# Patient Record
Sex: Female | Born: 1935
Health system: Southern US, Community
[De-identification: ages and names within clinical notes are randomized; demographics above are authoritative.]

## PROBLEM LIST (undated history)

## (undated) DIAGNOSIS — M1990 Unspecified osteoarthritis, unspecified site: Secondary | ICD-10-CM

## (undated) DIAGNOSIS — K219 Gastro-esophageal reflux disease without esophagitis: Secondary | ICD-10-CM

## (undated) DIAGNOSIS — W19XXXA Unspecified fall, initial encounter: Secondary | ICD-10-CM

## (undated) DIAGNOSIS — I129 Hypertensive chronic kidney disease with stage 1 through stage 4 chronic kidney disease, or unspecified chronic kidney disease: Secondary | ICD-10-CM

## (undated) DIAGNOSIS — F32A Depression, unspecified: Secondary | ICD-10-CM

## (undated) DIAGNOSIS — F329 Major depressive disorder, single episode, unspecified: Secondary | ICD-10-CM

## (undated) DIAGNOSIS — M810 Age-related osteoporosis without current pathological fracture: Secondary | ICD-10-CM

## (undated) DIAGNOSIS — R413 Other amnesia: Secondary | ICD-10-CM

## (undated) DIAGNOSIS — Z9109 Other allergy status, other than to drugs and biological substances: Secondary | ICD-10-CM

## (undated) DIAGNOSIS — E538 Deficiency of other specified B group vitamins: Secondary | ICD-10-CM

## (undated) DIAGNOSIS — G309 Alzheimer's disease, unspecified: Secondary | ICD-10-CM

## (undated) DIAGNOSIS — J302 Other seasonal allergic rhinitis: Secondary | ICD-10-CM

## (undated) DIAGNOSIS — F0280 Dementia in other diseases classified elsewhere without behavioral disturbance: Secondary | ICD-10-CM

## (undated) DIAGNOSIS — I10 Essential (primary) hypertension: Secondary | ICD-10-CM

## (undated) DIAGNOSIS — E785 Hyperlipidemia, unspecified: Secondary | ICD-10-CM

## (undated) HISTORY — DX: Gastro-esophageal reflux disease without esophagitis: K21.9

## (undated) HISTORY — DX: Depression, unspecified: F32.A

## (undated) HISTORY — DX: Unspecified osteoarthritis, unspecified site: M19.90

## (undated) HISTORY — DX: Unspecified fall, initial encounter: W19.XXXA

## (undated) HISTORY — DX: Hyperlipidemia, unspecified: E78.5

## (undated) HISTORY — DX: Other amnesia: R41.3

## (undated) HISTORY — PX: NASAL SINUS SURGERY: SHX719

## (undated) HISTORY — DX: Hypertensive chronic kidney disease with stage 1 through stage 4 chronic kidney disease, or unspecified chronic kidney disease: I12.9

## (undated) HISTORY — DX: Deficiency of other specified B group vitamins: E53.8

## (undated) HISTORY — DX: Major depressive disorder, single episode, unspecified: F32.9

## (undated) HISTORY — PX: VAGINAL HYSTERECTOMY: SUR661

## (undated) HISTORY — DX: Dementia in other diseases classified elsewhere, unspecified severity, without behavioral disturbance, psychotic disturbance, mood disturbance, and anxiety: F02.80

## (undated) HISTORY — DX: Age-related osteoporosis without current pathological fracture: M81.0

## (undated) HISTORY — DX: Other allergy status, other than to drugs and biological substances: Z91.09

## (undated) HISTORY — DX: Alzheimer's disease, unspecified: G30.9

## (undated) HISTORY — DX: Other seasonal allergic rhinitis: J30.2

---

## 2003-08-28 ENCOUNTER — Encounter: Payer: Self-pay | Admitting: Internal Medicine

## 2006-02-10 ENCOUNTER — Ambulatory Visit: Payer: Self-pay | Admitting: Critical Care Medicine

## 2006-03-29 ENCOUNTER — Ambulatory Visit: Payer: Self-pay | Admitting: Critical Care Medicine

## 2006-04-14 ENCOUNTER — Ambulatory Visit: Payer: Self-pay | Admitting: Critical Care Medicine

## 2006-06-03 ENCOUNTER — Ambulatory Visit: Payer: Self-pay | Admitting: Critical Care Medicine

## 2006-08-23 ENCOUNTER — Ambulatory Visit: Payer: Self-pay | Admitting: Critical Care Medicine

## 2006-09-28 ENCOUNTER — Ambulatory Visit: Payer: Self-pay | Admitting: Critical Care Medicine

## 2006-11-23 ENCOUNTER — Ambulatory Visit: Payer: Self-pay | Admitting: Critical Care Medicine

## 2007-05-13 ENCOUNTER — Ambulatory Visit: Payer: Self-pay | Admitting: Critical Care Medicine

## 2007-05-13 DIAGNOSIS — I10 Essential (primary) hypertension: Secondary | ICD-10-CM | POA: Insufficient documentation

## 2007-05-13 DIAGNOSIS — J309 Allergic rhinitis, unspecified: Secondary | ICD-10-CM | POA: Insufficient documentation

## 2007-05-13 LAB — CONVERTED CEMR LAB: IgE (Immunoglobulin E), Serum: 490.7 intl units/mL — ABNORMAL HIGH (ref 0.0–180.0)

## 2007-06-20 ENCOUNTER — Ambulatory Visit: Payer: Self-pay | Admitting: Critical Care Medicine

## 2007-08-08 ENCOUNTER — Encounter: Payer: Self-pay | Admitting: Critical Care Medicine

## 2007-08-08 ENCOUNTER — Encounter: Payer: Self-pay | Admitting: Internal Medicine

## 2007-08-31 ENCOUNTER — Ambulatory Visit: Payer: Self-pay | Admitting: Critical Care Medicine

## 2007-09-14 ENCOUNTER — Encounter: Payer: Self-pay | Admitting: Internal Medicine

## 2007-09-14 ENCOUNTER — Ambulatory Visit: Payer: Self-pay | Admitting: Critical Care Medicine

## 2007-09-14 ENCOUNTER — Telehealth: Payer: Self-pay | Admitting: Critical Care Medicine

## 2007-09-26 ENCOUNTER — Telehealth (INDEPENDENT_AMBULATORY_CARE_PROVIDER_SITE_OTHER): Payer: Self-pay | Admitting: *Deleted

## 2007-09-26 ENCOUNTER — Ambulatory Visit: Payer: Self-pay | Admitting: Critical Care Medicine

## 2007-09-28 ENCOUNTER — Ambulatory Visit: Payer: Self-pay | Admitting: Pulmonary Disease

## 2007-10-12 ENCOUNTER — Ambulatory Visit: Payer: Self-pay | Admitting: Critical Care Medicine

## 2007-10-26 ENCOUNTER — Ambulatory Visit: Payer: Self-pay | Admitting: Critical Care Medicine

## 2007-10-27 ENCOUNTER — Ambulatory Visit: Payer: Self-pay | Admitting: Critical Care Medicine

## 2007-10-27 DIAGNOSIS — J4540 Moderate persistent asthma, uncomplicated: Secondary | ICD-10-CM | POA: Insufficient documentation

## 2007-11-09 ENCOUNTER — Ambulatory Visit: Payer: Self-pay | Admitting: Critical Care Medicine

## 2007-11-23 ENCOUNTER — Ambulatory Visit: Payer: Self-pay | Admitting: Critical Care Medicine

## 2007-12-05 ENCOUNTER — Ambulatory Visit: Payer: Self-pay | Admitting: Critical Care Medicine

## 2007-12-07 ENCOUNTER — Ambulatory Visit: Payer: Self-pay | Admitting: Critical Care Medicine

## 2007-12-22 ENCOUNTER — Ambulatory Visit: Payer: Self-pay | Admitting: Critical Care Medicine

## 2008-01-04 ENCOUNTER — Ambulatory Visit: Payer: Self-pay | Admitting: Critical Care Medicine

## 2008-01-18 ENCOUNTER — Ambulatory Visit: Payer: Self-pay | Admitting: Critical Care Medicine

## 2008-02-01 ENCOUNTER — Ambulatory Visit: Payer: Self-pay | Admitting: Critical Care Medicine

## 2008-02-15 ENCOUNTER — Ambulatory Visit: Payer: Self-pay | Admitting: Critical Care Medicine

## 2008-02-29 ENCOUNTER — Ambulatory Visit: Payer: Self-pay | Admitting: Critical Care Medicine

## 2008-03-14 ENCOUNTER — Ambulatory Visit: Payer: Self-pay | Admitting: Critical Care Medicine

## 2008-03-28 ENCOUNTER — Ambulatory Visit: Payer: Self-pay | Admitting: Critical Care Medicine

## 2008-04-11 ENCOUNTER — Ambulatory Visit: Payer: Self-pay | Admitting: Critical Care Medicine

## 2008-04-18 ENCOUNTER — Ambulatory Visit: Payer: Self-pay | Admitting: Critical Care Medicine

## 2008-04-25 ENCOUNTER — Ambulatory Visit: Payer: Self-pay | Admitting: Critical Care Medicine

## 2008-05-09 ENCOUNTER — Ambulatory Visit: Payer: Self-pay | Admitting: Critical Care Medicine

## 2008-05-23 ENCOUNTER — Encounter: Payer: Self-pay | Admitting: Internal Medicine

## 2008-05-23 ENCOUNTER — Ambulatory Visit: Payer: Self-pay | Admitting: Critical Care Medicine

## 2008-06-06 ENCOUNTER — Ambulatory Visit: Payer: Self-pay | Admitting: Critical Care Medicine

## 2008-06-20 ENCOUNTER — Ambulatory Visit: Payer: Self-pay | Admitting: Critical Care Medicine

## 2008-07-04 ENCOUNTER — Ambulatory Visit: Payer: Self-pay | Admitting: Critical Care Medicine

## 2008-07-18 ENCOUNTER — Encounter: Admission: RE | Admit: 2008-07-18 | Discharge: 2008-07-18 | Payer: Self-pay | Admitting: Thoracic Surgery

## 2008-07-18 ENCOUNTER — Ambulatory Visit: Payer: Self-pay | Admitting: Critical Care Medicine

## 2008-08-01 ENCOUNTER — Telehealth (INDEPENDENT_AMBULATORY_CARE_PROVIDER_SITE_OTHER): Payer: Self-pay | Admitting: *Deleted

## 2008-08-02 ENCOUNTER — Ambulatory Visit: Payer: Self-pay | Admitting: Critical Care Medicine

## 2008-08-16 ENCOUNTER — Ambulatory Visit: Payer: Self-pay | Admitting: Critical Care Medicine

## 2008-08-29 ENCOUNTER — Ambulatory Visit: Payer: Self-pay | Admitting: Critical Care Medicine

## 2008-09-10 ENCOUNTER — Ambulatory Visit: Payer: Self-pay | Admitting: Critical Care Medicine

## 2008-09-14 ENCOUNTER — Ambulatory Visit: Payer: Self-pay | Admitting: Critical Care Medicine

## 2008-09-28 ENCOUNTER — Ambulatory Visit: Payer: Self-pay | Admitting: Critical Care Medicine

## 2008-10-12 ENCOUNTER — Ambulatory Visit: Payer: Self-pay | Admitting: Critical Care Medicine

## 2008-10-26 ENCOUNTER — Ambulatory Visit: Payer: Self-pay | Admitting: Critical Care Medicine

## 2008-11-09 ENCOUNTER — Ambulatory Visit: Payer: Self-pay | Admitting: Internal Medicine

## 2008-11-23 ENCOUNTER — Ambulatory Visit: Payer: Self-pay | Admitting: Critical Care Medicine

## 2008-12-10 ENCOUNTER — Ambulatory Visit: Payer: Self-pay | Admitting: Critical Care Medicine

## 2008-12-14 ENCOUNTER — Ambulatory Visit: Payer: Self-pay | Admitting: Internal Medicine

## 2008-12-14 DIAGNOSIS — Z860100 Personal history of colon polyps, unspecified: Secondary | ICD-10-CM | POA: Insufficient documentation

## 2008-12-21 ENCOUNTER — Ambulatory Visit: Payer: Self-pay | Admitting: Critical Care Medicine

## 2009-01-07 ENCOUNTER — Ambulatory Visit: Payer: Self-pay | Admitting: Critical Care Medicine

## 2009-01-09 ENCOUNTER — Ambulatory Visit: Payer: Self-pay | Admitting: Critical Care Medicine

## 2009-01-21 ENCOUNTER — Ambulatory Visit: Payer: Self-pay | Admitting: Critical Care Medicine

## 2009-02-06 ENCOUNTER — Ambulatory Visit: Payer: Self-pay | Admitting: Critical Care Medicine

## 2009-02-20 ENCOUNTER — Ambulatory Visit: Payer: Self-pay | Admitting: Critical Care Medicine

## 2009-03-06 ENCOUNTER — Encounter: Payer: Self-pay | Admitting: Internal Medicine

## 2009-03-06 ENCOUNTER — Ambulatory Visit: Payer: Self-pay | Admitting: Critical Care Medicine

## 2009-03-20 ENCOUNTER — Ambulatory Visit: Payer: Self-pay | Admitting: Critical Care Medicine

## 2009-04-03 ENCOUNTER — Ambulatory Visit: Payer: Self-pay | Admitting: Critical Care Medicine

## 2009-04-10 ENCOUNTER — Ambulatory Visit: Payer: Self-pay | Admitting: Critical Care Medicine

## 2009-04-17 ENCOUNTER — Ambulatory Visit: Payer: Self-pay | Admitting: Critical Care Medicine

## 2009-05-01 ENCOUNTER — Ambulatory Visit: Payer: Self-pay | Admitting: Critical Care Medicine

## 2009-05-15 ENCOUNTER — Ambulatory Visit: Payer: Self-pay | Admitting: Critical Care Medicine

## 2009-05-29 ENCOUNTER — Ambulatory Visit: Payer: Self-pay | Admitting: Critical Care Medicine

## 2009-06-12 ENCOUNTER — Ambulatory Visit: Payer: Self-pay | Admitting: Critical Care Medicine

## 2009-07-01 ENCOUNTER — Ambulatory Visit: Payer: Self-pay | Admitting: Internal Medicine

## 2009-07-22 ENCOUNTER — Ambulatory Visit: Payer: Self-pay | Admitting: Critical Care Medicine

## 2009-08-06 ENCOUNTER — Ambulatory Visit: Payer: Self-pay | Admitting: Critical Care Medicine

## 2009-08-23 ENCOUNTER — Ambulatory Visit: Payer: Self-pay | Admitting: Critical Care Medicine

## 2009-08-28 ENCOUNTER — Ambulatory Visit: Payer: Self-pay | Admitting: Critical Care Medicine

## 2009-09-04 ENCOUNTER — Ambulatory Visit: Payer: Self-pay | Admitting: Critical Care Medicine

## 2009-09-20 ENCOUNTER — Ambulatory Visit: Payer: Self-pay | Admitting: Critical Care Medicine

## 2009-09-27 ENCOUNTER — Ambulatory Visit: Payer: Self-pay | Admitting: Critical Care Medicine

## 2009-10-07 ENCOUNTER — Ambulatory Visit: Payer: Self-pay | Admitting: Critical Care Medicine

## 2009-10-21 ENCOUNTER — Ambulatory Visit: Payer: Self-pay | Admitting: Critical Care Medicine

## 2009-11-20 ENCOUNTER — Telehealth: Payer: Self-pay | Admitting: Pulmonary Disease

## 2009-11-20 ENCOUNTER — Ambulatory Visit: Payer: Self-pay | Admitting: Critical Care Medicine

## 2009-12-05 ENCOUNTER — Ambulatory Visit: Payer: Self-pay | Admitting: Critical Care Medicine

## 2009-12-10 ENCOUNTER — Telehealth (INDEPENDENT_AMBULATORY_CARE_PROVIDER_SITE_OTHER): Payer: Self-pay | Admitting: *Deleted

## 2009-12-20 ENCOUNTER — Ambulatory Visit: Payer: Self-pay | Admitting: Critical Care Medicine

## 2009-12-25 ENCOUNTER — Ambulatory Visit: Payer: Self-pay | Admitting: Critical Care Medicine

## 2010-01-02 ENCOUNTER — Ambulatory Visit: Payer: Self-pay | Admitting: Critical Care Medicine

## 2010-01-16 ENCOUNTER — Ambulatory Visit: Payer: Self-pay | Admitting: Critical Care Medicine

## 2010-01-16 ENCOUNTER — Encounter: Payer: Self-pay | Admitting: Internal Medicine

## 2010-01-30 ENCOUNTER — Ambulatory Visit: Payer: Self-pay | Admitting: Critical Care Medicine

## 2010-02-13 ENCOUNTER — Ambulatory Visit: Payer: Self-pay | Admitting: Critical Care Medicine

## 2010-02-27 ENCOUNTER — Ambulatory Visit: Payer: Self-pay | Admitting: Critical Care Medicine

## 2010-03-13 ENCOUNTER — Ambulatory Visit: Payer: Self-pay | Admitting: Critical Care Medicine

## 2010-03-26 ENCOUNTER — Ambulatory Visit: Payer: Self-pay | Admitting: Internal Medicine

## 2010-03-26 ENCOUNTER — Ambulatory Visit: Payer: Self-pay | Admitting: Critical Care Medicine

## 2010-04-15 ENCOUNTER — Ambulatory Visit: Payer: Self-pay | Admitting: Critical Care Medicine

## 2010-04-30 ENCOUNTER — Ambulatory Visit: Payer: Self-pay | Admitting: Critical Care Medicine

## 2010-05-16 ENCOUNTER — Ambulatory Visit: Payer: Self-pay | Admitting: Critical Care Medicine

## 2010-05-29 ENCOUNTER — Ambulatory Visit: Payer: Self-pay | Admitting: Critical Care Medicine

## 2010-06-09 ENCOUNTER — Ambulatory Visit: Payer: Self-pay | Admitting: Critical Care Medicine

## 2010-07-03 ENCOUNTER — Ambulatory Visit: Payer: Self-pay | Admitting: Critical Care Medicine

## 2010-07-17 ENCOUNTER — Ambulatory Visit: Payer: Self-pay | Admitting: Critical Care Medicine

## 2010-07-30 ENCOUNTER — Ambulatory Visit
Admission: RE | Admit: 2010-07-30 | Discharge: 2010-07-30 | Payer: Self-pay | Source: Home / Self Care | Attending: Critical Care Medicine | Admitting: Critical Care Medicine

## 2010-08-14 ENCOUNTER — Ambulatory Visit: Admission: RE | Admit: 2010-08-14 | Discharge: 2010-08-14 | Payer: Self-pay | Source: Home / Self Care

## 2010-08-28 ENCOUNTER — Ambulatory Visit: Admit: 2010-08-28 | Payer: Self-pay | Admitting: Critical Care Medicine

## 2010-08-29 ENCOUNTER — Ambulatory Visit
Admission: RE | Admit: 2010-08-29 | Discharge: 2010-08-29 | Payer: Self-pay | Source: Home / Self Care | Attending: Critical Care Medicine | Admitting: Critical Care Medicine

## 2010-09-02 NOTE — Assessment & Plan Note (Signed)
Summary: [name] ///kp  Nurse Visit   Vitals Entered By: Marcy Siren Deborra Medina) (April 30, 2010 2:24 PM)  Allergies: 1)  ! Sulfa  Medication Administration  Injection # 1:    Medication: Xolair (omalizumab) 150mg     Diagnosis: EXTRINSIC ASTHMA, UNSPECIFIED (ICD-493.00)    Route: IM    Site: R deltoid    Exp Date: 05/03/2013    Lot #: [id number]    Mfr: Genetech    Comments: xolair 300, Unit 60 1.2 ml in left deltoid and 1.2 ml in [name] deltoid. Pt waited 20 mins.    Given by: Verdie Mosher, CMA  Orders Added: 1)  Xolair (omalizumab) 150mg  [J2357] 2)  Administration xolair injection [phone]

## 2010-09-02 NOTE — Assessment & Plan Note (Signed)
Summary: [name]  Nurse Visit   Allergies: 1)  ! Sulfa  Medication Administration  Injection # 1:    Medication: Xolair (omalizumab) 150mg     Diagnosis: EXTRINSIC ASTHMA, UNSPECIFIED (ICD-493.00)    Route: IM    Site: R deltoid    Exp Date: 05/03/2013    Lot #: [id number]    Mfr: Genetech    Comments: xolair 300 mg, 60 units, 1.2 ml x 1 in Left deltoid and 1.2 ml in Right deltoid. Pt waited 30 mins    Given by: Tammy Scott,Allergy Tech  Orders Added: 1)  Xolair (omalizumab) 150mg  [J2357] 2)  Administration xolair injection [id number]

## 2010-09-02 NOTE — Assessment & Plan Note (Signed)
Summary: xolair///kp  Nurse Visit   Allergies: 1)  ! Sulfa  Medication Administration  Injection # 1:    Medication: Xolair (omalizumab) 150mg     Diagnosis: EXTRINSIC ASTHMA, UNSPECIFIED (ICD-493.00)    Route: SQ    Site: R deltoid    Exp Date: 12/01/2012    Lot #: [id number]    Mfr: GENENTECH    Comments: 1.2 ML IN RIGHT AND 1.2ML IN LEFT PT WAITED 20 MINS CHARGED 96401 AND [phone]    Patient tolerated injection without complications    Given by: [facility]   Medication Administration  Injection # 1:    Medication: Xolair (omalizumab) 150mg     Diagnosis: EXTRINSIC ASTHMA, UNSPECIFIED (ICD-493.00)    Route: SQ    Site: R deltoid    Exp Date: 12/01/2012    Lot #: [id number]    Mfr: GENENTECH    Comments: 1.2 ML IN RIGHT AND 1.2ML IN LEFT PT WAITED 20 MINS CHARGED 96401 AND [id number]    Patient tolerated injection without complications    Given by: [facility]

## 2010-09-02 NOTE — Progress Notes (Signed)
Summary: cough  Phone Note Call from Patient   Caller: Patient Call For: [name] Summary of Call: coughing spells [facility] Initial call taken by: Gustavus Bryant,  Dec 10, 2009 10:44 AM  Follow-up for Phone Call        pt was given a zpak on 11/20/09 because of chest congestion and productive cough. Pt states congestion is improved, but she still has a cough that is mostly a dry cough and delsym is not helping. Pt states on occasion she will cough up a small amount of yellow phlegm.  Please advise. Nassawadox Bing CMA  Dec 10, 2009 11:06 AM   Additional Follow-up for Phone Call Additional follow up Details #1::        call in prednisone 10mg  Take 4 daily for two days, then 3 daily for two days, then two daily for two days then one daily for two days then stop  call in : augmentin 875mg  two times a day x 5 days OV if unimproved  Additional Follow-up by: Elsie Stain MD,  Dec 10, 2009 2:43 PM    Additional Follow-up for Phone Call Additional follow up Details #2::    called and spoke with pt.  pt aware rx sent to pharmacy.  Jinny Blossom Reynolds LPN  May 10, 624THL 624THL PM   New/Updated Medications: PREDNISONE 10 MG TABS (PREDNISONE) take 4 tabs x 2 days, then 3 tabs x 2 days, then 2 tabs x 2 days, then 1 tab x 2 days then stop. AUGMENTIN 875-125 MG TABS (AMOXICILLIN-POT CLAVULANATE) Take 1 tablet by mouth two times a day for 5 days Prescriptions: AUGMENTIN 875-125 MG TABS (AMOXICILLIN-POT CLAVULANATE) Take 1 tablet by mouth two times a day for 5 days  #10 x 0   Entered by:   Matthew Folks LPN   Authorized by:   Elsie Stain MD   Signed by:   Matthew Folks LPN on 075-GRM   Method used:   Electronically to        [facility] [phone]* (retail)       [place], [place]  [address]       Ph: [phone] or [phone]       Fax: [phone]   [id number] PREDNISONE 10 MG TABS (PREDNISONE) take 4  tabs x 2 days, then 3 tabs x 2 days, then 2 tabs x 2 days, then 1 tab x 2 days then stop.  #20 x 0   Entered by:   Matthew Folks LPN   Authorized by:   Elsie Stain MD   Signed by:   Matthew Folks LPN on 075-GRM   Method used:   Electronically to        [facility] [phone]* (retail)       [address]       [place], [place]  [address]       Ph: [phone] or [phone]       Fax: [phone]   RxID:   [id number]

## 2010-09-02 NOTE — Assessment & Plan Note (Signed)
Summary: xolair/jd  Nurse Visit   Allergies: 1)  ! Sulfa  Medication Administration  Injection # 1:    Medication: Xolair (omalizumab) 150mg     Diagnosis: EXTRINSIC ASTHMA, UNSPECIFIED (ICD-493.00)    Route: IM    Site: R deltoid    Exp Date: 03/03/2013    Lot #: [id number]    Mfr: Thedora Hinders    Comments: xolair 300 mg and 60 units, 1.2 ml x 1 in left deltoid and 1.2 ml x 1 in right deltoid. pt waited 30 mins.    Given by: Marcy Siren [facility])  Orders Added: 1)  Xolair (omalizumab) 150mg  [J2357] 2)  Administration xolair injection [id number]   Medication Administration  Injection # 1:    Medication: Xolair (omalizumab) 150mg     Diagnosis: EXTRINSIC ASTHMA, UNSPECIFIED (ICD-493.00)    Route: IM    Site: R deltoid    Exp Date: 03/03/2013    Lot #: [id number]    Mfr: Thedora Hinders    Comments: xolair 300 mg and 60 units, 1.2 ml x 1 in left deltoid and 1.2 ml x 1 in right deltoid. pt waited 30 mins.    Given by: Marcy Siren [facility])  Orders Added: 1)  Xolair (omalizumab) 150mg  [J2357] 2)  Administration xolair injection [phone]

## 2010-09-02 NOTE — Assessment & Plan Note (Signed)
Summary: xolair/ mbw  Nurse Visit   Allergies: 1)  ! Sulfa  Medication Administration  Injection # 1:    Medication: Xolair (omalizumab) 150mg     Diagnosis: EXTRINSIC ASTHMA, UNSPECIFIED (ICD-493.00)    Route: SQ    Site: R deltoid    Exp Date: 12/01/2012    Lot #: [id number]    Mfr: GENENTECH    Comments: 1.2 ML RIGHT AND LEFT ARM PT WAITED 30MINS    Patient tolerated injection without complications    Given by: [facility]  Orders Added: 1)  Xolair (omalizumab) 150mg  [J2357] 2)  Administration xolair injection [id number]   Medication Administration  Injection # 1:    Medication: Xolair (omalizumab) 150mg     Diagnosis: EXTRINSIC ASTHMA, UNSPECIFIED (ICD-493.00)    Route: SQ    Site: R deltoid    Exp Date: 12/01/2012    Lot #: [id number]    Mfr: GENENTECH    Comments: 1.2 ML RIGHT AND LEFT ARM PT WAITED 30MINS    Patient tolerated injection without complications    Given by: [facility]  Orders Added: 1)  Xolair (omalizumab) 150mg  [J2357] 2)  Administration xolair injection [id number]

## 2010-09-02 NOTE — Assessment & Plan Note (Signed)
Summary: xolair//kp  Nurse Visit   Allergies: 1)  ! Sulfa  Medication Administration  Injection # 1:    Medication: Xolair (omalizumab) 150mg     Diagnosis: EXTRINSIC ASTHMA, UNSPECIFIED (ICD-493.00)    Route: IM    Site: R deltoid    Exp Date: 05/03/2013    Lot #: [id number]    Mfr: Genetech    Comments: xolair 300, 60 unit 1.2 ml x 1 in Right Deltoid and 1.2 ml in Left deltoid. Pt waited 44mins.    Given by: Brock Bad, CMA  Orders Added: 1)  Xolair (omalizumab) 150mg  [J2357] 2)  Administration xolair injection [phone]

## 2010-09-02 NOTE — Assessment & Plan Note (Signed)
Summary: Pulmonary OV   Visit Type:  Follow-up Primary Provider/Referring Provider:  Domenick Gong, MD  CC:  The patient is here for follow-up.  No complaints today.Marland Kitchen  History of Present Illness: Pulmonary OV  This is a 77 year old, white female, history of asthmatic bronchitis, severe, persistent.  Severe Atopy on Xolair.    September 27, 2009 10:04 AM The pt is much imprimporved compared to 1/11.   Pt denies any significant sore throat, nasal congestion or excess secretions, fever, chills, sweats, unintended weight loss, pleurtic or exertional chest pain, orthopnea PND, or leg swelling Pt denies any increase in rescue therapy over baseline, denies waking up needing it or having any early am or nocturnal exacerbations of coughing/wheezing/or dyspnea.  Dec 25, 2009 2:36 PM Husband expired 4/12 was expected.  Then had xolair injecion two weeks late then became ill end o 4/11 and rx zpak then augmentin and prednisone. Now is better with no cough and congestion.  Did not sleep well on pred. Pt denies any significant sore throat, nasal congestion or excess secretions, fever, chills, sweats, unintended weight loss, pleurtic or exertional chest pain, orthopnea PND, or leg swelling Pt denies any increase in rescue therapy over baseline, denies waking up needing it or having any early am or nocturnal exacerbations of coughing/wheezing/or dyspnea.     March 26, 2010 3:31 PM Pt doing well, no new issues.  No cough or wheeze.    Pt denies any significant sore throat, nasal congestion or excess secretions, fever, chills, sweats, unintended weight loss, pleurtic or exertional chest pain, orthopnea PND, or leg swelling Pt denies any increase in rescue therapy over baseline, denies waking up needing it or having any early am or nocturnal exacerbations of coughing/wheezing/or dyspnea.   Asthma History    Asthma Control Assessment:    Age range: 12+ years    Symptoms: 0-2 days/week  Nighttime Awakenings: 0-2/month    Interferes w/ normal activity: no limitations    SABA use (not for EIB): 0-2 days/week    ATAQ questionnaire: 0    Asthma Control Assessment: Well Controlled   Current Medications (verified): 1)  Norvasc 5 Mg  Tabs (Amlodipine Besylate) .... One By Mouth Once Daily 2)  Benicar 40 Mg Tabs (Olmesartan Medoxomil) .... Take 1 Tablet By Mouth Once A Day 3)  Fluoxetine Hcl 20 Mg Tabs (Fluoxetine Hcl) .... Once Daily 4)  Oscal 500/200 D-3 500-200 Mg-Unit  Tabs (Calcium-Vitamin D) .... Two  By Mouth Once Daily 5)  Flonase 50 Mcg/act  Susp (Fluticasone Propionate) .... Two Puff Ea Nostril Once Daily 6)  Tylenol Extra Strength 500 Mg  Tabs (Acetaminophen) .Marland Kitchen.. 1 By Mouth 4 Times A Day 7)  Singulair 10 Mg  Tabs (Montelukast Sodium) .... One By Mouth Once Daily 8)  Multivitamins   Tabs (Multiple Vitamin) .... One By Mouth Once Daily 9)  Zyrtec Allergy 10 Mg Tabs (Cetirizine Hcl) .Marland Kitchen.. 1 By Mouth Daily 10)  Xolair 150 Mg  Solr (Omalizumab) .... 300 Mg Subcutaneously Every Two Weeks 11)  Proair Hfa 108 (90 Base) Mcg/act  Aers (Albuterol Sulfate) .Marland Kitchen.. 1-2 Puffs Every 4-6 Hours As Needed 12)  Hydrochlorothiazide 25 Mg Tabs (Hydrochlorothiazide) .... Take 1 Tablet By Mouth Once A Day 13)  Advair Diskus 250-50 Mcg/dose Misc (Fluticasone-Salmeterol) .Marland Kitchen.. 1 Puff Two Times A Day  Allergies (verified): 1)  ! Sulfa  Past History:  Past medical, surgical, family and social histories (including risk factors) reviewed, and no changes noted (except as noted below).  Past  Medical History: Reviewed history from 12/14/2008 and no changes required. Hypertension Extrinsic asthma Allergic Rhinitis Arthritis Asthma Depression Hyperlipidemia  Past Surgical History: Reviewed history from 12/14/2008 and no changes required. hysterectomy  Family History: Reviewed history from 12/14/2008 and no changes required. Asthma Family History of Colon Cancer:father  Social  History: Reviewed history from 12/14/2008 and no changes required. Patient never smoked.  Occupation: retired Patient has never smoked.  Alcohol Use - yes 1 glass of wine Daily Caffeine Use Illicit Drug Use - no  Review of Systems  The patient denies shortness of breath with activity, shortness of breath at rest, productive cough, non-productive cough, coughing up blood, chest pain, irregular heartbeats, acid heartburn, indigestion, loss of appetite, weight change, abdominal pain, difficulty swallowing, sore throat, tooth/dental problems, headaches, nasal congestion/difficulty breathing through nose, sneezing, itching, ear ache, anxiety, depression, hand/feet swelling, joint stiffness or pain, rash, change in color of mucus, and fever.    Vital Signs:  Patient profile:   77 year old female Height:      62 inches (157.48 cm) Weight:      134.13 pounds (60.97 kg) BMI:     24.62 O2 Sat:      93 % on Room air Temp:     98.1 degrees F (36.72 degrees C) oral Pulse rate:   76 / minute BP sitting:   128 / 78  (left arm) Cuff size:   regular  Vitals Entered By: Francesca Jewett CMA (March 26, 2010 3:28 PM)  O2 Sat at Rest %:  93 O2 Flow:  Room air CC: The patient is here for follow-up.  No complaints today. Comments Medications reviewed with the patient. Daytime phone verified. Francesca Jewett CMA  March 26, 2010 3:31 PM   Physical Exam  Additional Exam:  Gen: WD WN    WF      in NAD    NCAT Heent:  no jvd, no TMG, no cervical LNademopathy, orophyx clear,  nares with clear watery drainage. Cor: RRR nl s1/s2  no s3/s4  no m r h g Abd: soft NT BSA   no masses  No HSM  no rebound or guarding Ext perfused with no c v e v.d Neuro: intact, moves all 4s, CN II-XII intact, DTRs intact Chest: distant BS  no  wheezes,  no rales, rhonchi   no egophony  no consolidative breath sounds, mild hyperresonance to percussion Skin: clear  Genital/Rectal :deferred    Impression &  Recommendations:  Problem # 1:  EXTRINSIC ASTHMA, UNSPECIFIED (ICD-493.00) Assessment Unchanged Moderate persistent asthma with chronic lower airway inflammation now improved  plan No change in inhaled medications.   Maintain treatment program as currently prescribed.  Complete Medication List: 1)  Norvasc 5 Mg Tabs (Amlodipine besylate) .... One by mouth once daily 2)  Benicar 40 Mg Tabs (Olmesartan medoxomil) .... Take 1 tablet by mouth once a day 3)  Fluoxetine Hcl 20 Mg Tabs (Fluoxetine hcl) .... Once daily 4)  Oscal 500/200 D-3 500-200 Mg-unit Tabs (Calcium-vitamin d) .... Two  by mouth once daily 5)  Flonase 50 Mcg/act Susp (Fluticasone propionate) .... Two puff ea nostril once daily 6)  Tylenol Extra Strength 500 Mg Tabs (Acetaminophen) .Marland Kitchen.. 1 by mouth 4 times a day 7)  Singulair 10 Mg Tabs (Montelukast sodium) .... One by mouth once daily 8)  Multivitamins Tabs (Multiple vitamin) .... One by mouth once daily 9)  Zyrtec Allergy 10 Mg Tabs (Cetirizine hcl) .Marland Kitchen.. 1 by mouth daily 10)  Xolair  150 Mg Solr (Omalizumab) .... 300 mg subcutaneously every two weeks 11)  Proair Hfa 108 (90 Base) Mcg/act Aers (Albuterol sulfate) .Marland Kitchen.. 1-2 puffs every 4-6 hours as needed 12)  Hydrochlorothiazide 25 Mg Tabs (Hydrochlorothiazide) .... Take 1 tablet by mouth once a day 13)  Advair Diskus 250-50 Mcg/dose Misc (Fluticasone-salmeterol) .Marland Kitchen.. 1 puff two times a day  Other Orders: Xolair (omalizumab) 150mg  [id number]) Administration xolair injection [phone]) Est. Patient Level III [id number])  Patient Instructions: 1)  No change in medications 2)  Return in     4     months   Medication Administration  Injection # 1:    Medication: Xolair (omalizumab) 150mg     Diagnosis: EXTRINSIC ASTHMA, UNSPECIFIED (ICD-493.00)    Route: IM    Site: R deltoid    Exp Date: 05/03/2013    Lot #: [id number]    Mfr: Thedora Hinders    Comments: xolair 300 mg, 60 units 1.2 ml x1 in right deltoid and 1.2 ml x1 in left  deltoid Pt waited20 mins    Given by: Brock Bad, CMA  Orders Added: 1)  Xolair (omalizumab) 150mg  [J2357] 2)  Administration xolair injection [id number] 3)  Est. Patient Level III [id number]   Appended Document: Pulmonary OV fax richard tisovec

## 2010-09-02 NOTE — Assessment & Plan Note (Signed)
Summary: Pulmonary OV   Primary Provider/Referring Provider:  Domenick Gong, MD  CC:  2 mo follow up.  states breathing is doing  well overall.  c/o cough-occasionally productive with light yellow phelgm-worse when talking- x3wks.  History of Present Illness: Pulmonary OV  This is a 77 year old, white female, history of asthmatic bronchitis, severe, persistent.  Severe Atopy on Xolair.   January 09, 2009 4:18 PM doing well.  No mucous.  No chst pain.  No wheeze.  No heartburn. No use of rescue inhaler  April 10, 2009 10:28 AM Pt noting over the past one month more dyspnea and cough productive of thick yellow mucous. Also more heartburn when she stopped PPIs.  No wheeze noted. Pt denies any significant sore throat, nasal congestion or , fever, chills, sweats, unintended weight loss, pleurtic or exertional chest pain, orthopnea PND, or leg swelling Pt denies any increase in rescue therapy over baseline, denies waking up needing it or having any early am or nocturnal exacerbations of coughing/wheezing/or dyspnea. There are not any alleviating or precipitating factors noted.  The symptoms do not generally fluctuate.  June 12, 2009 2:03 PM Had heavy URI three weeks ago.   The pt did ok with 04/10/09  rx with doxycycline/prednisone pulse. Pt now is stable and improved after URI. Pt denies any significant sore throat, nasal congestion or excess secretions, fever, chills, sweats, unintended weight loss, pleurtic or exertional chest pain, orthopnea PND, or leg swelling Pt denies any increase in rescue therapy over baseline, denies waking up needing it or having any early am or nocturnal exacerbations of coughing/wheezing/or dyspnea. August 28, 2009 12:14 PM Still with cough,  had three weeks ago cough exacerbation.  rx guaifenesin for this and helped now with residual issues. No real wheeze.  No chest pain.  No chest pain.  No f/c/s.   Asthma History    Asthma Control Assessment:  Age range: 12+ years    Symptoms: >2 days/week    Nighttime Awakenings: 0-2/month    Interferes w/ normal activity: some limitations    SABA use (not for EIB): >2 days/week    ATAQ questionnaire: 1-2    Exacerbations requiring oral systemic steroids: 0-1/year    Asthma Control Assessment: Not Well Controlled   Preventive Screening-Counseling & Management  Alcohol-Tobacco     Smoking Status: never  Current Medications (verified): 1)  Norvasc 5 Mg  Tabs (Amlodipine Besylate) .... One By Mouth Once Daily 2)  Benicar 40 Mg Tabs (Olmesartan Medoxomil) .... Take 1 Tablet By Mouth Once A Day 3)  Fluoxetine Hcl 20 Mg Tabs (Fluoxetine Hcl) .... Once Daily 4)  Oscal 500/200 D-3 500-200 Mg-Unit  Tabs (Calcium-Vitamin D) .... Two  By Mouth Once Daily 5)  Flonase 50 Mcg/act  Susp (Fluticasone Propionate) .... Two Puff Ea Nostril Once Daily 6)  Tylenol Extra Strength 500 Mg  Tabs (Acetaminophen) .Marland Kitchen.. 1 By Mouth 4 Times A Day 7)  Singulair 10 Mg  Tabs (Montelukast Sodium) .... One By Mouth Once Daily 8)  Multivitamins   Tabs (Multiple Vitamin) .... One By Mouth Once Daily 9)  Zyrtec Allergy 10 Mg Tabs (Cetirizine Hcl) .Marland Kitchen.. 1 By Mouth Daily 10)  Xolair 150 Mg  Solr (Omalizumab) .... 300 Mg Subcutaneously Every Two Weeks 11)  Mucinex 600 Mg Xr12h-Tab (Guaifenesin) .... As Directed 12)  Ventolin Hfa 108 (90 Base) Mcg/act Aers (Albuterol Sulfate) .... 2 Puffs Every 4 Hours As Needed 13)  Hydrochlorothiazide 25 Mg Tabs (Hydrochlorothiazide) .... Take 1 Tablet  By Mouth Once A Day 14)  Advair Diskus 250-50 Mcg/dose Misc (Fluticasone-Salmeterol) .Marland Kitchen.. 1 Puff Two Times A Day  Allergies (verified): 1)  ! Sulfa  Past History:  Past medical, surgical, family and social histories (including risk factors) reviewed, and no changes noted (except as noted below).  Past Medical History: Reviewed history from 12/14/2008 and no changes required. Hypertension Extrinsic asthma Allergic  Rhinitis Arthritis Asthma Depression Hyperlipidemia  Past Surgical History: Reviewed history from 12/14/2008 and no changes required. hysterectomy  Family History: Reviewed history from 12/14/2008 and no changes required. Asthma Family History of Colon Cancer:father  Social History: Reviewed history from 12/14/2008 and no changes required. Patient never smoked.  Occupation: retired Patient has never smoked.  Alcohol Use - yes 1 glass of wine Daily Caffeine Use Illicit Drug Use - no  Review of Systems       The patient complains of productive cough, non-productive cough, and nasal congestion/difficulty breathing through nose.  The patient denies shortness of breath with activity, shortness of breath at rest, coughing up blood, chest pain, irregular heartbeats, acid heartburn, indigestion, loss of appetite, weight change, abdominal pain, difficulty swallowing, sore throat, tooth/dental problems, headaches, sneezing, itching, ear ache, anxiety, depression, hand/feet swelling, joint stiffness or pain, rash, change in color of mucus, and fever.         Mucous is light yellow  Vital Signs:  Patient profile:   77 year old female Height:      62.5 inches Weight:      139.50 pounds O2 Sat:      98 % on Room air Temp:     98.1 degrees F oral Pulse rate:   68 / minute BP sitting:   132 / 70  (right arm) Cuff size:   regular  Vitals Entered By: Raymondo Band RN (August 28, 2009 12:05 PM)  O2 Flow:  Room air CC: 2 mo follow up.  states breathing is doing  well overall.  c/o cough-occasionally productive with light yellow phelgm-worse when talking- x3wks Comments Medications reviewed with patient Daytime contact number verified with patient. Raymondo Band RN  August 28, 2009 12:04 PM    Physical Exam  Additional Exam:  Gen: WD WN    WF      in NAD    NCAT Heent:  no jvd, no TMG, no cervical LNademopathy, orophyx clear,  nares with clear watery drainage. Cor: RRR nl s1/s2   no s3/s4  no m r h g Abd: soft NT BSA   no masses  No HSM  no rebound or guarding Ext perfused with no c v e v.d Neuro: intact, moves all 4s, CN II-XII intact, DTRs intact Chest: distant BS  no  wheezes,  no rales, rhonchi   no egophony  no consolidative breath sounds, mild hyperresonance to percussion Skin: clear  Genital/Rectal :deferred    Impression & Recommendations:  Problem # 1:  EXTRINSIC ASTHMA, UNSPECIFIED (ICD-493.00) Assessment Unchanged Moderate persistent asthma with chronic lower airway inflammation plan Prednisone 10mg  Take 4 daily for two days, then 3 daily for two days, then two daily for two days then one daily for two days then stop Stay on Advair Use Saline nasal spray twice daily Proair will be substituted for Ventolin  Medications Added to Medication List This Visit: 1)  Proair Hfa 108 (90 Base) Mcg/act Aers (Albuterol sulfate) .Marland Kitchen.. 1-2 puffs every 4-6 hours as needed 2)  Prednisone 10 Mg Tabs (Prednisone) .... Take as directed take 4 daily for two  days, then 3 daily for two days, then two daily for two days then one daily for two days then stop  Complete Medication List: 1)  Norvasc 5 Mg Tabs (Amlodipine besylate) .... One by mouth once daily 2)  Benicar 40 Mg Tabs (Olmesartan medoxomil) .... Take 1 tablet by mouth once a day 3)  Fluoxetine Hcl 20 Mg Tabs (Fluoxetine hcl) .... Once daily 4)  Oscal 500/200 D-3 500-200 Mg-unit Tabs (Calcium-vitamin d) .... Two  by mouth once daily 5)  Flonase 50 Mcg/act Susp (Fluticasone propionate) .... Two puff ea nostril once daily 6)  Tylenol Extra Strength 500 Mg Tabs (Acetaminophen) .Marland Kitchen.. 1 by mouth 4 times a day 7)  Singulair 10 Mg Tabs (Montelukast sodium) .... One by mouth once daily 8)  Multivitamins Tabs (Multiple vitamin) .... One by mouth once daily 9)  Zyrtec Allergy 10 Mg Tabs (Cetirizine hcl) .Marland Kitchen.. 1 by mouth daily 10)  Xolair 150 Mg Solr (Omalizumab) .... 300 mg subcutaneously every two weeks 11)  Mucinex 600  Mg Xr12h-tab (Guaifenesin) .... As directed 12)  Proair Hfa 108 (90 Base) Mcg/act Aers (Albuterol sulfate) .Marland Kitchen.. 1-2 puffs every 4-6 hours as needed 13)  Hydrochlorothiazide 25 Mg Tabs (Hydrochlorothiazide) .... Take 1 tablet by mouth once a day 14)  Advair Diskus 250-50 Mcg/dose Misc (Fluticasone-salmeterol) .Marland Kitchen.. 1 puff two times a day 15)  Prednisone 10 Mg Tabs (Prednisone) .... Take as directed take 4 daily for two days, then 3 daily for two days, then two daily for two days then one daily for two days then stop  Other Orders: Est. Patient Level IV [id number])  Patient Instructions: 1)  Prednisone 10mg  Take 4 daily for two days, then 3 daily for two days, then two daily for two days then one daily for two days then stop 2)  Stay on Advair 3)  Use Saline nasal spray twice daily 4)  Proair will be substituted for Ventolin 5)  Obtain Mucinex DM two twice a day (600mg ) or one twice a day(maximum strength 1200mg ) for 10days 6)  Return one month Prescriptions: PROAIR HFA 108 (90 BASE) MCG/ACT  AERS (ALBUTEROL SULFATE) 1-2 puffs every 4-6 hours as needed  #1 x 6   Entered and Authorized by:   Elsie Stain MD   Signed by:   Elsie Stain MD on 08/28/2009   Method used:   Electronically to        [facility] [phone]* (retail)       [address]       [place], [place]  [address]       Ph: [phone] or [phone]       Fax: [phone]   [id number] PREDNISONE 10 MG  TABS (PREDNISONE) Take as directed Take 4 daily for two days, then 3 daily for two days, then two daily for two days then one daily for two days then stop  #20 x 0   Entered and Authorized by:   Elsie Stain MD   Signed by:   Elsie Stain MD on 08/28/2009   Method used:   Electronically to        [facility] [phone]* (retail)       [address]       [place], [place]  [address]       Ph: [phone] or [phone]       Fax: [phone]  [id number]     Appended Document: Pulmonary OV fax Domenick Gong

## 2010-09-02 NOTE — Progress Notes (Signed)
Summary: Cough- RA rec zpack and delsym-LMTCB x 1  Phone Note Call from Patient   Caller: Patient Call For: Dr.Wright Details for Reason: Cough for two wks. Summary of Call: [name] has had a cough for two wks. now. She would like for you to call something in for her. She is waiting in our little lobby.(she's had her xolair shot.) I'm not sure if you know this her husband died sometime in the last month. Please advise. Initial call taken by: Alroy Bailiff,  November 20, 2009 12:12 PM  Follow-up for Phone Call        pt c/o productive cough with yellow phlegm x 2 weeks. She ahs been occupied with her sick husband who recently died this month and that is why she hasn't called before now. Please advise. [place] Bing CMA  November 20, 2009 12:52 PM   zpak x 5 ds delsym OTC 1 tsp three times a day as needed  If no better -OV with PW or TP Follow-up by: Leanna Sato. Elsworth Soho MD,  November 20, 2009 2:24 PM  Additional Follow-up for Phone Call Additional follow up Details #1::        LMOMTCB [place]  November 20, 2009 2:29 PM  Returning call Netta Neat  November 20, 2009 4:17 PM  pt advised of recs. rx sent. [place] Bing CMA  November 20, 2009 4:27 PM       New/Updated Medications: ZITHROMAX Z-PAK 250 MG TABS (AZITHROMYCIN) as directed Prescriptions: ZITHROMAX Z-PAK 250 MG TABS (AZITHROMYCIN) as directed  #1 pak x 0   Entered by:   Madelia Bing CMA   Authorized by:   Leanna Sato. Elsworth Soho MD   Signed by:   [place] Bing CMA on 11/20/2009   Method used:   Electronically to        [facility] [phone]* (retail)       [address]       [place], [place]  [address]       Ph: [phone] or [phone]       Fax: [phone]   RxID:   [id number]   Appended Document: Cough- RA rec zpack and delsym-LMTCB x 1 noted and ok  pw

## 2010-09-02 NOTE — Miscellaneous (Signed)
Summary: Injection Record / [facility]    Injection Record /  Allergy    Imported By: Rise Patience 02/04/2010 11:07:20  _____________________________________________________________________  External Attachment:    Type:   Image     Comment:   External Document

## 2010-09-02 NOTE — Assessment & Plan Note (Signed)
Summary: xolair/jd  Nurse Visit   Allergies: 1)  ! Sulfa  Medication Administration  Injection # 1:    Medication: Xolair (omalizumab) 150mg     Diagnosis: EXTRINSIC ASTHMA, UNSPECIFIED (ICD-493.00)    Route: SQ    Site: R deltoid    Exp Date: 10/03/2012    Lot #: [id number]    Mfr: GENENTECH    Comments: 1.2 ML IN RIGHT ARM AND 1.2 ML IN LEFT ARM PT WAITED 20 MINS    Patient tolerated injection without complications    Given by: [facility]  Orders Added: 1)  Xolair (omalizumab) 150mg  [J2357] 2)  Administration xolair injection [id number]   Medication Administration  Injection # 1:    Medication: Xolair (omalizumab) 150mg     Diagnosis: EXTRINSIC ASTHMA, UNSPECIFIED (ICD-493.00)    Route: SQ    Site: R deltoid    Exp Date: 10/03/2012    Lot #: [id number]    Mfr: GENENTECH    Comments: 1.2 ML IN RIGHT ARM AND 1.2 ML IN LEFT ARM PT WAITED 20 MINS    Patient tolerated injection without complications    Given by: [facility]  Orders Added: 1)  Xolair (omalizumab) 150mg  [J2357] 2)  Administration xolair injection [id number]

## 2010-09-02 NOTE — Assessment & Plan Note (Signed)
Summary: xolair/ mbw  Nurse Visit   Allergies: 1)  ! Sulfa  Medication Administration  Injection # 1:    Medication: Xolair (omalizumab) 150mg     Diagnosis: EXTRINSIC ASTHMA, UNSPECIFIED (ICD-493.00)    Route: SQ    Site: L deltoid    Exp Date: 10/2012    Lot #: [id number]    Mfr: Celso Amy    Comments: Injection given by Alroy Bailiff in allergy lab. Xolair 300mg . 1.58ml x 1 in Left and Right Deltoid. Pt waited 30 minutes.     Patient tolerated injection without complications  Orders Added: 1)  Admin of Therapeutic Inj  intramuscular or subcutaneous [96372] 2)  Xolair (omalizumab) 150mg  [J2357]

## 2010-09-02 NOTE — Assessment & Plan Note (Signed)
Summary: xolair/ mbw  Nurse Visit   Allergies: 1)  ! Sulfa  Medication Administration  Injection # 1:    Medication: Xolair (omalizumab) 150mg     Diagnosis: EXTRINSIC ASTHMA, UNSPECIFIED (ICD-493.00)    Route: SQ    Site: R deltoid    Exp Date: 01/31/2013    Lot #: [id number]    Mfr: Genetech    Comments: 1.2 ML IN RIGHT AND LEFT ARM CHARGED [id number] AND [id number]    Patient tolerated injection without complications    Given by: [facility]   Medication Administration  Injection # 1:    Medication: Xolair (omalizumab) 150mg     Diagnosis: EXTRINSIC ASTHMA, UNSPECIFIED (ICD-493.00)    Route: SQ    Site: R deltoid    Exp Date: 01/31/2013    Lot #: [id number]    Mfr: Genetech    Comments: 1.2 ML IN RIGHT AND LEFT ARM CHARGED [id number] AND 53664    Patient tolerated injection without complications    Given by: [facility]

## 2010-09-02 NOTE — Assessment & Plan Note (Signed)
Summary: [name] ///KP  Nurse Visit   Allergies: 1)  ! Sulfa  Medication Administration  Injection # 1:    Medication: Xolair (omalizumab) 150mg     Diagnosis: EXTRINSIC ASTHMA, UNSPECIFIED (ICD-493.00)    Route: SQ    Site: L deltoid    Exp Date: 12/01/2012    Lot #: [id number]    Mfr: GENENTECH    Comments: 1.2 ML LEFT AND [name] ARM PT WAITED 60 MINS    Patient tolerated injection without complications    Given by: [facility]  Orders Added: 1)  Xolair (omalizumab) 150mg  [J2357] 2)  Administration xolair injection [id number]   Medication Administration  Injection # 1:    Medication: Xolair (omalizumab) 150mg     Diagnosis: EXTRINSIC ASTHMA, UNSPECIFIED (ICD-493.00)    Route: SQ    Site: L deltoid    Exp Date: 12/01/2012    Lot #: [id number]    Mfr: GENENTECH    Comments: 1.2 ML LEFT AND [name] ARM PT WAITED 30 MINS    Patient tolerated injection without complications    Given by: [facility]  Orders Added: 1)  Xolair (omalizumab) 150mg  [J2357] 2)  Administration xolair injection [id number]

## 2010-09-02 NOTE — Assessment & Plan Note (Signed)
Summary: xolair/apc  Nurse Visit   Allergies: 1)  ! Sulfa  Medication Administration  Injection # 1:    Medication: Xolair (omalizumab) 150mg     Diagnosis: EXTRINSIC ASTHMA, UNSPECIFIED (ICD-493.00)    Route: SQ    Site: R deltoid    Exp Date: 08/2012    Lot #: [id number]    Mfr: Celso Amy    Comments: Injection given by Marcy Siren in allergy lab. Xolair 300mg . 1.16ml x 1 in Right and Left deltoid. Pt waited 30 minutes.     Patient tolerated injection without complications  Orders Added: 1)  Admin of Therapeutic Inj  intramuscular or subcutaneous [96372] 2)  Xolair (omalizumab) 150mg  [J2357]

## 2010-09-02 NOTE — Assessment & Plan Note (Signed)
Summary: Pulmonary OV   Primary Provider/Referring Provider:  Domenick Gong, MD  CC:  Follow up.  c/o "sneezing and coughing off and on" x 1 month.  Cough was prod with yellow mucus.  Denies SOB, wheezing, and chest tightness.  Marland Kitchen  History of Present Illness: Pulmonary OV  This is a 77 year old, white female, history of asthmatic bronchitis, severe, persistent.  Severe Atopy on Xolair.    September 27, 2009 10:04 AM The pt is much imprimporved compared to 1/11.   Pt denies any significant sore throat, nasal congestion or excess secretions, fever, chills, sweats, unintended weight loss, pleurtic or exertional chest pain, orthopnea PND, or leg swelling Pt denies any increase in rescue therapy over baseline, denies waking up needing it or having any early am or nocturnal exacerbations of coughing/wheezing/or dyspnea.  Dec 25, 2009 2:36 PM Husband expired 4/12 was expected.  Then had xolair injecion two weeks late then became ill end o 4/11 and rx zpak then augmentin and prednisone. Now is better with no cough and congestion.  Did not sleep well on pred. Pt denies any significant sore throat, nasal congestion or excess secretions, fever, chills, sweats, unintended weight loss, pleurtic or exertional chest pain, orthopnea PND, or leg swelling Pt denies any increase in rescue therapy over baseline, denies waking up needing it or having any early am or nocturnal exacerbations of coughing/wheezing/or dyspnea.     March 26, 2010 3:31 PM Pt doing well, no new issues.  No cough or wheeze.    Pt denies any significant sore throat, nasal congestion or excess secretions, fever, chills, sweats, unintended weight loss, pleurtic or exertional chest pain, orthopnea PND, or leg swelling Pt denies any increase in rescue therapy over baseline, denies waking up needing it or having any early am or nocturnal exacerbations of coughing/wheezing/or dyspnea.   June 09, 2010 11:30 AM The pt notes sl  cough of yellow mucus and took mucinex dm for a week and did resolve.  Some sneezing fits as well. xolair has helped No new issues. Pt denies any significant sore throat, nasal congestion or excess secretions, fever, chills, sweats, unintended weight loss, pleurtic or exertional chest pain, orthopnea PND, or leg swelling Pt denies any increase in rescue therapy over baseline, denies waking up needing it or having any early am or nocturnal exacerbations of coughing/wheezing/or dyspnea.   Current Medications (verified): 1)  Norvasc 5 Mg  Tabs (Amlodipine Besylate) .... One By Mouth Once Daily 2)  Benicar 40 Mg Tabs (Olmesartan Medoxomil) .... Take 1 Tablet By Mouth Once A Day 3)  Fluoxetine Hcl 20 Mg Tabs (Fluoxetine Hcl) .... Once Daily 4)  Oscal 500/200 D-3 500-200 Mg-Unit  Tabs (Calcium-Vitamin D) .... Two  By Mouth Once Daily 5)  Flonase 50 Mcg/act  Susp (Fluticasone Propionate) .... Two Puff Ea Nostril Once Daily 6)  Tylenol Extra Strength 500 Mg  Tabs (Acetaminophen) .Marland Kitchen.. 1 By Mouth 4 Times A Day 7)  Singulair 10 Mg  Tabs (Montelukast Sodium) .... One By Mouth Once Daily 8)  Multivitamins   Tabs (Multiple Vitamin) .... One By Mouth Once Daily 9)  Zyrtec Allergy 10 Mg Tabs (Cetirizine Hcl) .Marland Kitchen.. 1 By Mouth Daily 10)  Xolair 150 Mg  Solr (Omalizumab) .... 300 Mg Subcutaneously Every Two Weeks 11)  Proair Hfa 108 (90 Base) Mcg/act  Aers (Albuterol Sulfate) .Marland Kitchen.. 1-2 Puffs Every 4-6 Hours As Needed 12)  Hydrochlorothiazide 25 Mg Tabs (Hydrochlorothiazide) .... Take 1 Tablet By Mouth Once A Day 13)  Advair Diskus 250-50 Mcg/dose Misc (Fluticasone-Salmeterol) .Marland Kitchen.. 1 Puff Two Times A Day  Allergies (verified): 1)  ! Sulfa  Past History:  Past medical, surgical, family and social histories (including risk factors) reviewed, and no changes noted (except as noted below).  Past Medical History: Reviewed history from 12/14/2008 and no changes required. Hypertension Extrinsic asthma Allergic  Rhinitis Arthritis Asthma Depression Hyperlipidemia  Past Surgical History: Reviewed history from 12/14/2008 and no changes required. hysterectomy  Family History: Reviewed history from 12/14/2008 and no changes required. Asthma Family History of Colon Cancer:father  Social History: Reviewed history from 12/14/2008 and no changes required. Patient never smoked.  Occupation: retired Patient has never smoked.  Alcohol Use - yes 1 glass of wine Daily Caffeine Use Illicit Drug Use - no  Review of Systems       The patient complains of shortness of breath with activity and productive cough.  The patient denies shortness of breath at rest, non-productive cough, coughing up blood, chest pain, irregular heartbeats, acid heartburn, indigestion, loss of appetite, weight change, abdominal pain, difficulty swallowing, sore throat, tooth/dental problems, headaches, nasal congestion/difficulty breathing through nose, sneezing, itching, ear ache, anxiety, depression, hand/feet swelling, joint stiffness or pain, rash, change in color of mucus, and fever.    Vital Signs:  Patient profile:   77 year old female Height:      62 inches Weight:      137.13 pounds BMI:     25.17 O2 Sat:      98 % on Room air Temp:     98.1 degrees F oral Pulse rate:   62 / minute BP sitting:   116 / 70  (left arm) Cuff size:   regular  Vitals Entered By: Raymondo Band RN (June 09, 2010 11:21 AM)  O2 Flow:  Room air CC: Follow up.  c/o "sneezing and coughing off and on" x 1 month.  Cough was prod with yellow mucus.  Denies SOB, wheezing, chest tightness.   Comments Medications reviewed with patient Daytime contact number verified with patient. Crystal Jones RN  June 09, 2010 11:21 AM    Physical Exam  Additional Exam:  Gen: WD WN    WF      in NAD    NCAT Heent:  no jvd, no TMG, no cervical LNademopathy, orophyx clear,  nares with clear watery drainage. Cor: RRR nl s1/s2  no s3/s4  no m r h  g Abd: soft NT BSA   no masses  No HSM  no rebound or guarding Ext perfused with no c v e v.d Neuro: intact, moves all 4s, CN II-XII intact, DTRs intact Chest: distant BS  no  wheezes,  no rales, rhonchi   no egophony  no consolidative breath sounds, mild hyperresonance to percussion Skin: clear  Genital/Rectal :deferred    Impression & Recommendations:  Problem # 1:  EXTRINSIC ASTHMA, UNSPECIFIED (ICD-493.00) Assessment Improved  Moderate persistent asthma with chronic lower airway inflammation now improved  plan No change in inhaled medications.   Maintain treatment program as currently prescribed.  Complete Medication List: 1)  Norvasc 5 Mg Tabs (Amlodipine besylate) .... One by mouth once daily 2)  Benicar 40 Mg Tabs (Olmesartan medoxomil) .... Take 1 tablet by mouth once a day 3)  Fluoxetine Hcl 20 Mg Tabs (Fluoxetine hcl) .... Once daily 4)  Oscal 500/200 D-3 500-200 Mg-unit Tabs (Calcium-vitamin d) .... Two  by mouth once daily 5)  Flonase 50 Mcg/act Susp (Fluticasone propionate) .Marland KitchenMarland KitchenMarland Kitchen  Two puff ea nostril once daily 6)  Tylenol Extra Strength 500 Mg Tabs (Acetaminophen) .Marland Kitchen.. 1 by mouth 4 times a day 7)  Singulair 10 Mg Tabs (Montelukast sodium) .... One by mouth once daily 8)  Multivitamins Tabs (Multiple vitamin) .... One by mouth once daily 9)  Zyrtec Allergy 10 Mg Tabs (Cetirizine hcl) .Marland Kitchen.. 1 by mouth daily 10)  Xolair 150 Mg Solr (Omalizumab) .... 300 mg subcutaneously every two weeks 11)  Proair Hfa 108 (90 Base) Mcg/act Aers (Albuterol sulfate) .Marland Kitchen.. 1-2 puffs every 4-6 hours as needed 12)  Hydrochlorothiazide 25 Mg Tabs (Hydrochlorothiazide) .... Take 1 tablet by mouth once a day 13)  Advair Diskus 250-50 Mcg/dose Misc (Fluticasone-salmeterol) .Marland Kitchen.. 1 puff two times a day  Other Orders: Est. Patient Level III [id number]) Administration xolair injection [phone]) Xolair (omalizumab) 150mg  [id number])  Patient Instructions: 1)  Ok to use mucinex DM as needed for  congestion 2)  No change in medications 3)  Return in     4     months   Immunization History:  Influenza Immunization History:    Influenza:  historical (04/03/2010)   Prevention & Chronic Care Immunizations   Influenza vaccine: Historical  (04/03/2010)    Tetanus booster: Not documented    Pneumococcal vaccine: Pneumovax  (06/04/2009)    H. zoster vaccine: Not documented  Colorectal Screening   Hemoccult: Not documented    Colonoscopy: Results: Diverticulosis.       Pathology:  Hyperplastic polyp.     Location:  [place].    (08/28/2003)   Colonoscopy due: 08/2008  Other Screening   Pap smear: Not documented    Mammogram: Not documented    DXA bone density scan: Not documented   Smoking status: never  (12/25/2009)  Lipids   Total Cholesterol: Not documented   LDL: Not documented   LDL Direct: Not documented   HDL: Not documented   Triglycerides: Not documented  Hypertension   Last Blood Pressure: 116 / 70  (06/09/2010)   Serum creatinine: Not documented   Serum potassium Not documented  Self-Management Support :    Hypertension self-management support: Not documented   Medication Administration  Injection # 1:    Medication: Xolair (omalizumab) 150mg     Diagnosis: EXTRINSIC ASTHMA, UNSPECIFIED (ICD-493.00)    Route: IM    Site: R deltoid    Exp Date: 05/03/2013    Lot #: [id number]    Mfr: Genetech    Comments: xolair 300 mg, 60 units, 1.2 ml left deltoid and 1.2 ml in right deltoid. Pt waited 20 mins.    Given by: Alroy Bailiff, Allergy Tech.  Orders Added: 1)  Est. Patient Level III [id number] 2)  Administration xolair injection [id number] 3)  Xolair (omalizumab) 150mg  [J2357]   Appended Document: Pulmonary OV fax richard tisovec

## 2010-09-02 NOTE — Miscellaneous (Signed)
Summary: Injection Record / [facility]    Injection Record /  Allergy    Imported By: Rise Patience 12/23/2009 15:29:05  _____________________________________________________________________  External Attachment:    Type:   Image     Comment:   External Document

## 2010-09-02 NOTE — Assessment & Plan Note (Signed)
Summary: [name]  Nurse Visit   Vitals Entered By: Marcy Siren (Deborra Medina)   Allergies: 1)  ! Sulfa  Medication Administration  Injection # 1:    Medication: Xolair (omalizumab) 150mg     Diagnosis: EXTRINSIC ASTHMA, UNSPECIFIED (ICD-493.00)    Route: IM    Site: R deltoid    Exp Date: 05/03/2013    Lot #: [id number]    Mfr: [employer]et[employer]    Comments: xolair 300mg ,60 units, 1.2 ml x 1 in Left deltoid, 1.2 ml in Right deltoid. pt waited 20 mins.    Given by: Brock Bad, [facility])  Orders Added: 1)  Xolair (omalizumab) 150mg  [J2357] 2)  Administration xolair injection [phone]

## 2010-09-02 NOTE — Assessment & Plan Note (Signed)
Summary: xolair/jd  Nurse Visit   Allergies: 1)  ! Sulfa  Medication Administration  Injection # 1:    Medication: Xolair (omalizumab) 150mg     Diagnosis: EXTRINSIC ASTHMA, UNSPECIFIED (ICD-493.00)    Route: SQ    Site: R deltoid    Exp Date: 10/2012    Lot #: [id number]    Mfr: Celso Amy    Comments: Injection given by Brock Bad, CMA in allergy lab. Xolair 300mg . 1.31ml x 1 in Right and Left Deltoid. Pt waited 30 minutes.     Patient tolerated injection without complications  Orders Added: 1)  Admin of Therapeutic Inj  intramuscular or subcutaneous [[id number]] 2)  Xolair (omalizumab) 150mg  [J2357]

## 2010-09-02 NOTE — Assessment & Plan Note (Signed)
Summary: xolair/mh  Nurse Visit   Allergies: 1)  ! Sulfa  Medication Administration  Injection # 1:    Medication: Xolair (omalizumab) 150mg     Diagnosis: EXTRINSIC ASTHMA, UNSPECIFIED (ICD-493.00)    Route: SQ    Site: R deltoid    Exp Date: 05/2013    Lot #: [id number]    Mfr: [employer]    Comments: 1.2 ML IN RIGHT AND LEFT ARM 300MG  PT WAITED [place] AND [address]    Patient tolerated injection without complications    Given by: [facility]  Orders Added: 1)  Xolair (omalizumab) 150mg  [J2357] 2)  Administration xolair injection [id number]   Medication Administration  Injection # 1:    Medication: Xolair (omalizumab) 150mg     Diagnosis: EXTRINSIC ASTHMA, UNSPECIFIED (ICD-493.00)    Route: SQ    Site: R deltoid    Exp Date: 05/2013    Lot #: [id number]    Mfr: [employer]    Comments: 1.2 ML IN RIGHT AND LEFT ARM 300MG  PT WAITED [place] AND [address]    Patient tolerated injection without complications    Given by: TAMMY SCOTT IN ALLERGY LAB  Orders Added: 1)  Xolair (omalizumab) 150mg  [J2357] 2)  Administration xolair injection [id number]

## 2010-09-02 NOTE — Assessment & Plan Note (Signed)
Summary: xolair/ mbw  Nurse Visit   Allergies: 1)  ! Sulfa  Medication Administration  Injection # 1:    Medication: Xolair (omalizumab) 150mg     Diagnosis: EXTRINSIC ASTHMA, UNSPECIFIED (ICD-493.00)    Route: SQ    Site: R deltoid    Exp Date: 08/2012    Lot #: [id number]    Mfr: Celso Amy    Comments: Injection given by Alroy Bailiff in allergy lab. Xolair 300mg . 1.68ml x 1 in Right and Left Deltoid. Pt waited 30 minutes.    Patient tolerated injection without complications  Orders Added: 1)  Admin of Therapeutic Inj  intramuscular or subcutaneous [[id number]] 2)  Xolair (omalizumab) 150mg  [J2357]

## 2010-09-02 NOTE — Assessment & Plan Note (Signed)
Summary: xolair/apc  Nurse Visit   Allergies: 1)  ! Sulfa  Medication Administration  Injection # 1:    Medication: Xolair (omalizumab) 150mg     Diagnosis: EXTRINSIC ASTHMA, UNSPECIFIED (ICD-493.00)    Route: SQ    Site: R deltoid    Exp Date: 12/01/2012    Lot #: [id number]    Mfr: GENENTECH    Comments: 1.2 ML IN RIGHT AND 1.2ML IN LEFT PT WAITED 20 MINS    Patient tolerated injection without complications    Given by: [facility]  Orders Added: 1)  Xolair (omalizumab) 150mg  [J2357] 2)  Administration xolair injection [id number]   Medication Administration  Injection # 1:    Medication: Xolair (omalizumab) 150mg     Diagnosis: EXTRINSIC ASTHMA, UNSPECIFIED (ICD-493.00)    Route: SQ    Site: R deltoid    Exp Date: 12/01/2012    Lot #: [id number]    Mfr: GENENTECH    Comments: 1.2 ML IN RIGHT AND 1.2ML IN LEFT PT WAITED 20 MINS    Patient tolerated injection without complications    Given by: [facility]  Orders Added: 1)  Xolair (omalizumab) 150mg  [J2357] 2)  Administration xolair injection [id number]

## 2010-09-02 NOTE — Assessment & Plan Note (Signed)
Summary: Pulmonary OV   Primary Provider/Referring Provider:  Domenick Gong, MD  CC:  3 month followup.  Pt states that her breathing has overall been okay.  Since last seen she was txed with zithromax augmentin and pred taper for cough.  She states that the cough has now resolved.  No complaints today.  Needs refill for fluticasone.Marland Kitchen  History of Present Illness: Pulmonary OV  This is a 77 year old, white female, history of asthmatic bronchitis, severe, persistent.  Severe Atopy on Xolair.    September 27, 2009 10:04 AM The pt is much imprimporved compared to 1/11.   Pt denies any significant sore throat, nasal congestion or excess secretions, fever, chills, sweats, unintended weight loss, pleurtic or exertional chest pain, orthopnea PND, or leg swelling Pt denies any increase in rescue therapy over baseline, denies waking up needing it or having any early am or nocturnal exacerbations of coughing/wheezing/or dyspnea.  Dec 25, 2009 2:36 PM Husband expired 4/12 was expected.  Then had xolair injecion two weeks late then became ill end o 4/11 and rx zpak then augmentin and prednisone. Now is better with no cough and congestion.  Did not sleep well on pred. Pt denies any significant sore throat, nasal congestion or excess secretions, fever, chills, sweats, unintended weight loss, pleurtic or exertional chest pain, orthopnea PND, or leg swelling Pt denies any increase in rescue therapy over baseline, denies waking up needing it or having any early am or nocturnal exacerbations of coughing/wheezing/or dyspnea.       Asthma History    Asthma Control Assessment:    Age range: 12+ years    Symptoms: 0-2 days/week    Nighttime Awakenings: 0-2/month    Interferes w/ normal activity: no limitations    SABA use (not for EIB): 0-2 days/week    ATAQ questionnaire: 0    Exacerbations requiring oral systemic steroids: 0-1/year    Asthma Control Assessment: Well Controlled   Preventive  Screening-Counseling & Management  Alcohol-Tobacco     Smoking Status: never  Current Medications (verified): 1)  Norvasc 5 Mg  Tabs (Amlodipine Besylate) .... One By Mouth Once Daily 2)  Benicar 40 Mg Tabs (Olmesartan Medoxomil) .... Take 1 Tablet By Mouth Once A Day 3)  Fluoxetine Hcl 20 Mg Tabs (Fluoxetine Hcl) .... Once Daily 4)  Oscal 500/200 D-3 500-200 Mg-Unit  Tabs (Calcium-Vitamin D) .... Two  By Mouth Once Daily 5)  Flonase 50 Mcg/act  Susp (Fluticasone Propionate) .... Two Puff Ea Nostril Once Daily 6)  Tylenol Extra Strength 500 Mg  Tabs (Acetaminophen) .Marland Kitchen.. 1 By Mouth 4 Times A Day 7)  Singulair 10 Mg  Tabs (Montelukast Sodium) .... One By Mouth Once Daily 8)  Multivitamins   Tabs (Multiple Vitamin) .... One By Mouth Once Daily 9)  Zyrtec Allergy 10 Mg Tabs (Cetirizine Hcl) .Marland Kitchen.. 1 By Mouth Daily 10)  Xolair 150 Mg  Solr (Omalizumab) .... 300 Mg Subcutaneously Every Two Weeks 11)  Proair Hfa 108 (90 Base) Mcg/act  Aers (Albuterol Sulfate) .Marland Kitchen.. 1-2 Puffs Every 4-6 Hours As Needed 12)  Hydrochlorothiazide 25 Mg Tabs (Hydrochlorothiazide) .... Take 1 Tablet By Mouth Once A Day 13)  Advair Diskus 250-50 Mcg/dose Misc (Fluticasone-Salmeterol) .Marland Kitchen.. 1 Puff Two Times A Day  Allergies (verified): 1)  ! Sulfa  Past History:  Past medical, surgical, family and social histories (including risk factors) reviewed, and no changes noted (except as noted below).  Past Medical History: Reviewed history from 12/14/2008 and no changes required. Hypertension Extrinsic asthma  Allergic Rhinitis Arthritis Asthma Depression Hyperlipidemia  Past Surgical History: Reviewed history from 12/14/2008 and no changes required. hysterectomy  Family History: Reviewed history from 12/14/2008 and no changes required. Asthma Family History of Colon Cancer:father  Social History: Reviewed history from 12/14/2008 and no changes required. Patient never smoked.  Occupation: retired Patient has  never smoked.  Alcohol Use - yes 1 glass of wine Daily Caffeine Use Illicit Drug Use - no  Review of Systems  The patient denies shortness of breath with activity, shortness of breath at rest, productive cough, non-productive cough, coughing up blood, chest pain, irregular heartbeats, acid heartburn, indigestion, loss of appetite, weight change, abdominal pain, difficulty swallowing, sore throat, tooth/dental problems, headaches, nasal congestion/difficulty breathing through nose, sneezing, itching, ear ache, anxiety, depression, hand/feet swelling, joint stiffness or pain, rash, change in color of mucus, and fever.    Vital Signs:  Patient profile:   77 year old female Weight:      138 pounds BMI:     25.33 O2 Sat:      97 % on Room air Temp:     97.8 degrees F oral Pulse rate:   68 / minute BP sitting:   106 / 62  (left arm)  Vitals Entered By: Tilden Dome (Dec 25, 2009 2:20 PM)  O2 Flow:  Room air  Physical Exam  Additional Exam:  Gen: WD WN    WF      in NAD    NCAT Heent:  no jvd, no TMG, no cervical LNademopathy, orophyx clear,  nares with clear watery drainage. Cor: RRR nl s1/s2  no s3/s4  no m r h g Abd: soft NT BSA   no masses  No HSM  no rebound or guarding Ext perfused with no c v e v.d Neuro: intact, moves all 4s, CN II-XII intact, DTRs intact Chest: distant BS  no  wheezes,  no rales, rhonchi   no egophony  no consolidative breath sounds, mild hyperresonance to percussion Skin: clear  Genital/Rectal :deferred    Impression & Recommendations:  Problem # 1:  EXTRINSIC ASTHMA, UNSPECIFIED (ICD-493.00) Assessment Improved  Moderate persistent asthma with chronic lower airway inflammation now improved  plan No change in inhaled medications.   Maintain treatment program as currently prescribed.  Complete Medication List: 1)  Norvasc 5 Mg Tabs (Amlodipine besylate) .... One by mouth once daily 2)  Benicar 40 Mg Tabs (Olmesartan medoxomil) .... Take 1 tablet  by mouth once a day 3)  Fluoxetine Hcl 20 Mg Tabs (Fluoxetine hcl) .... Once daily 4)  Oscal 500/200 D-3 500-200 Mg-unit Tabs (Calcium-vitamin d) .... Two  by mouth once daily 5)  Flonase 50 Mcg/act Susp (Fluticasone propionate) .... Two puff ea nostril once daily 6)  Tylenol Extra Strength 500 Mg Tabs (Acetaminophen) .Marland Kitchen.. 1 by mouth 4 times a day 7)  Singulair 10 Mg Tabs (Montelukast sodium) .... One by mouth once daily 8)  Multivitamins Tabs (Multiple vitamin) .... One by mouth once daily 9)  Zyrtec Allergy 10 Mg Tabs (Cetirizine hcl) .Marland Kitchen.. 1 by mouth daily 10)  Xolair 150 Mg Solr (Omalizumab) .... 300 mg subcutaneously every two weeks 11)  Proair Hfa 108 (90 Base) Mcg/act Aers (Albuterol sulfate) .Marland Kitchen.. 1-2 puffs every 4-6 hours as needed 12)  Hydrochlorothiazide 25 Mg Tabs (Hydrochlorothiazide) .... Take 1 tablet by mouth once a day 13)  Advair Diskus 250-50 Mcg/dose Misc (Fluticasone-salmeterol) .Marland Kitchen.. 1 puff two times a day  Other Orders: Est. Patient Level III [id number])  Patient Instructions: 1)  No change in medications 2)  Return in     3     months 3)  Refill on flonase sent to pharmacy Prescriptions: FLONASE 50 MCG/ACT  SUSP (FLUTICASONE PROPIONATE) two puff ea nostril once daily  #16 Gram x 6   Entered and Authorized by:   Elsie Stain MD   Signed by:   Elsie Stain MD on 12/25/2009   Method used:   Electronically to        [facility] [phone]* (retail)       [address]       [place], [place]  [address]       Ph: [phone] or [phone]       Fax: [phone]   RxID:   [id number]

## 2010-09-02 NOTE — Assessment & Plan Note (Signed)
Summary: Pulmonary OV   Primary Provider/Referring Provider:  Domenick Gong, MD  CC:  1 month follow up.  states breathing has "greatly improved."  states cough has resolved.  states she is still having PND and clearing throat.  denies SOB, wheezing, and and chest tightness.[name]  History of Present Illness: Pulmonary OV  This is a 77 year old, white female, history of asthmatic bronchitis, severe, persistent.  Severe Atopy on Xolair.   August 28, 2009 12:14 PM Still with cough,  had three weeks ago cough exacerbation.  rx guaifenesin for this and helped now with residual issues. No real wheeze.  No chest pain.  No chest pain.  No f/c/s.   September 27, 2009 10:04 AM The pt is much imprimporved compared to 1/11.   Pt denies any significant sore throat, nasal congestion or excess secretions, fever, chills, sweats, unintended weight loss, pleurtic or exertional chest pain, orthopnea PND, or leg swelling Pt denies any increase in rescue therapy over baseline, denies waking up needing it or having any early am or nocturnal exacerbations of coughing/wheezing/or dyspnea.   Asthma History    Asthma Control Assessment:    Age range: 12+ years    Symptoms: 0-2 days/week    Nighttime Awakenings: 0-2/month    Interferes w/ normal activity: no limitations    SABA use (not for EIB): 0-2 days/week    ATAQ questionnaire: 0    Exacerbations requiring oral systemic steroids: 0-1/year    Asthma Control Assessment: Well Controlled   Preventive Screening-Counseling & Management  Alcohol-Tobacco     Smoking Status: never  Current Medications (verified): 1)  Norvasc 5 Mg  Tabs (Amlodipine Besylate) .... One By Mouth Once Daily 2)  Benicar 40 Mg Tabs (Olmesartan Medoxomil) .... Take 1 Tablet By Mouth Once A Day 3)  Fluoxetine Hcl 20 Mg Tabs (Fluoxetine Hcl) .... Once Daily 4)  Oscal 500/200 D-3 500-200 Mg-Unit  Tabs (Calcium-Vitamin D) .... Two  By Mouth Once Daily 5)  Flonase 50 Mcg/act  Susp  (Fluticasone Propionate) .... Two Puff Ea Nostril Once Daily 6)  Tylenol Extra Strength 500 Mg  Tabs (Acetaminophen) .[name].. 1 By Mouth 4 Times A Day 7)  Singulair 10 Mg  Tabs (Montelukast Sodium) .... One By Mouth Once Daily 8)  Multivitamins   Tabs (Multiple Vitamin) .... One By Mouth Once Daily 9)  Zyrtec Allergy 10 Mg Tabs (Cetirizine Hcl) .[name].. 1 By Mouth Daily 10)  Xolair 150 Mg  Solr (Omalizumab) .... 300 Mg Subcutaneously Every Two Weeks 11)  Proair Hfa 108 (90 Base) Mcg/act  Aers (Albuterol Sulfate) .[name].. 1-2 Puffs Every 4-6 Hours As Needed 12)  Hydrochlorothiazide 25 Mg Tabs (Hydrochlorothiazide) .... Take 1 Tablet By Mouth Once A Day 13)  Advair Diskus 250-50 Mcg/dose Misc (Fluticasone-Salmeterol) .[name].. 1 Puff Two Times A Day  Allergies (verified): 1)  ! Sulfa  Past History:  Past medical, surgical, family and social histories (including risk factors) reviewed, and no changes noted (except as noted below).  Past Medical History: Reviewed history from 12/14/2008 and no changes required. Hypertension Extrinsic asthma Allergic Rhinitis Arthritis Asthma Depression Hyperlipidemia  Past Surgical History: Reviewed history from 12/14/2008 and no changes required. hysterectomy  Family History: Reviewed history from 12/14/2008 and no changes required. Asthma Family History of Colon Cancer:father  Social History: Reviewed history from 12/14/2008 and no changes required. Patient never smoked.  Occupation: retired Patient has never smoked.  Alcohol Use - yes 1 glass of wine Daily Caffeine Use Illicit Drug Use - no  Review of Systems  The patient denies shortness of breath with activity, shortness of breath at rest, productive cough, non-productive cough, coughing up blood, chest pain, irregular heartbeats, acid heartburn, indigestion, loss of appetite, weight change, abdominal pain, difficulty swallowing, sore throat, tooth/dental problems, headaches, nasal  congestion/difficulty breathing through nose, sneezing, itching, ear ache, anxiety, depression, hand/feet swelling, joint stiffness or pain, rash, change in color of mucus, and fever.    Vital Signs:  Patient profile:   77 year old female Height:      62 inches Weight:      141.25 pounds O2 Sat:      95 % on Room air Temp:     98.2 degrees F oral Pulse rate:   70 / minute BP sitting:   110 / 60  (left arm) Cuff size:   regular  Vitals Entered By: Raymondo Band RN (September 27, 2009 10:01 AM)  O2 Flow:  Room air CC: 1 month follow up.  states breathing has "greatly improved."  states cough has resolved.  states she is still having PND and clearing throat.  denies SOB, wheezing, and chest tightness. Comments Medications reviewed with patient Daytime contact number verified with patient. Raymondo Band RN  September 27, 2009 10:01 AM    Physical Exam  Additional Exam:  Gen: WD WN    WF      in NAD    NCAT Heent:  no jvd, no TMG, no cervical LNademopathy, orophyx clear,  nares with clear watery drainage. Cor: RRR nl s1/s2  no s3/s4  no m r h g Abd: soft NT BSA   no masses  No HSM  no rebound or guarding Ext perfused with no c v e v.d Neuro: intact, moves all 4s, CN II-XII intact, DTRs intact Chest: distant BS  no  wheezes,  no rales, rhonchi   no egophony  no consolidative breath sounds, mild hyperresonance to percussion Skin: clear  Genital/Rectal :deferred    Impression & Recommendations:  Problem # 1:  EXTRINSIC ASTHMA, UNSPECIFIED (ICD-493.00) Assessment Unchanged  Moderate persistent asthma with chronic lower airway inflammation now improved  plan No change in inhaled medications.   Maintain treatment program as currently prescribed.  Complete Medication List: 1)  Norvasc 5 Mg Tabs (Amlodipine besylate) .... One by mouth once daily 2)  Benicar 40 Mg Tabs (Olmesartan medoxomil) .... Take 1 tablet by mouth once a day 3)  Fluoxetine Hcl 20 Mg Tabs (Fluoxetine hcl) ....  Once daily 4)  Oscal 500/200 D-3 500-200 Mg-unit Tabs (Calcium-vitamin d) .... Two  by mouth once daily 5)  Flonase 50 Mcg/act Susp (Fluticasone propionate) .... Two puff ea nostril once daily 6)  Tylenol Extra Strength 500 Mg Tabs (Acetaminophen) .[name].. 1 by mouth 4 times a day 7)  Singulair 10 Mg Tabs (Montelukast sodium) .... One by mouth once daily 8)  Multivitamins Tabs (Multiple vitamin) .... One by mouth once daily 9)  Zyrtec Allergy 10 Mg Tabs (Cetirizine hcl) .[name].. 1 by mouth daily 10)  Xolair 150 Mg Solr (Omalizumab) .... 300 mg subcutaneously every two weeks 11)  Proair Hfa 108 (90 Base) Mcg/act Aers (Albuterol sulfate) .[name].. 1-2 puffs every 4-6 hours as needed 12)  Hydrochlorothiazide 25 Mg Tabs (Hydrochlorothiazide) .... Take 1 tablet by mouth once a day 13)  Advair Diskus 250-50 Mcg/dose Misc (Fluticasone-salmeterol) .[name].. 1 puff two times a day  Other Orders: Est. Patient Level III [id number])  Patient Instructions: 1)  Return 3 months 2)  No change in  medications    Appended Document: Pulmonary OV fax richard tisovec

## 2010-09-02 NOTE — Assessment & Plan Note (Signed)
Summary: xolair/apc  Nurse Visit   Allergies: 1)  ! Sulfa  Medication Administration  Injection # 1:    Medication: Xolair (omalizumab) 150mg     Diagnosis: 493.00    Route: SQ    Site: L deltoid    Exp Date: 01/2013    Lot #: [id number]    Mfr: Genetech    Comments: 1.2 ML IN LEFT AND RIGHT ARM CHARGED 96401AND J 2357    Given by: Alroy Bailiff IN [facility]   Medication Administration  Injection # 1:    Medication: Xolair (omalizumab) 150mg     Diagnosis: 493.00    Route: SQ    Site: L deltoid    Exp Date: 01/2013    Lot #: [id number]    Mfr: Thedora Hinders    Comments: 1.2 ML IN LEFT AND RIGHT ARM CHARGED 96401AND J 2357    Given by: Lynelle Smoke [facility]

## 2010-09-02 NOTE — Miscellaneous (Signed)
Summary: Injection Record / [facility]    Injection Record / [facility]    Imported By: Rise Patience 12/23/2009 14:59:50  _____________________________________________________________________  External Attachment:    Type:   Image     Comment:   External Document

## 2010-09-02 NOTE — Assessment & Plan Note (Signed)
Summary: Arvid Right ///kp  Nurse Visit   Allergies: 1)  ! Sulfa  Medication Administration  Injection # 1:    Medication: Xolair (omalizumab) 150mg     Diagnosis: EXTRINSIC ASTHMA, UNSPECIFIED (ICD-493.00)    Route: SQ    Site: R deltoid    Exp Date: 10/03/2012    Lot #: [id number]    Mfr: GENENTECH    Comments: 1.2 ML IN RIGHT AND 1.2 ML IN LEFT ARM PT WAITED 20 MINS    Patient tolerated injection without complications    Given by: [facility]  Orders Added: 1)  Xolair (omalizumab) 150mg  [J2357] 2)  Administration xolair injection [id number]   Medication Administration  Injection # 1:    Medication: Xolair (omalizumab) 150mg     Diagnosis: EXTRINSIC ASTHMA, UNSPECIFIED (ICD-493.00)    Route: SQ    Site: R deltoid    Exp Date: 10/03/2012    Lot #: [id number]    Mfr: GENENTECH    Comments: 1.2 ML IN RIGHT AND 1.2 ML IN LEFT ARM PT WAITED 20 MINS    Patient tolerated injection without complications    Given by: [facility]  Orders Added: 1)  Xolair (omalizumab) 150mg  [J2357] 2)  Administration xolair injection [id number]

## 2010-09-02 NOTE — Assessment & Plan Note (Signed)
Summary: xolair/klw  Nurse Visit   Allergies: 1)  ! Sulfa  Medication Administration  Injection # 1:    Medication: Xolair (omalizumab) 150mg     Diagnosis: 493.00    Route: SQ    Site: R deltoid    Exp Date: 03/2013    Lot #: [id number]    Mfr: Thedora Hinders    Comments: 1.2 ML IN RIGHT AND LEFT ARM PT WAITED [place] J2357 AND 10932    Patient tolerated injection without complications    Given by: [facility]   Medication Administration  Injection # 1:    Medication: Xolair (omalizumab) 150mg     Diagnosis: 493.00    Route: SQ    Site: R deltoid    Exp Date: 03/2013    Lot #: [id number]    Mfr: Thedora Hinders    Comments: 1.2 ML IN RIGHT AND LEFT ARM PT WAITED [place] AND [address]73    Patient tolerated injection without complications    Given by: [facility]

## 2010-09-02 NOTE — Assessment & Plan Note (Signed)
Summary: xolair///kp  Nurse Visit   Allergies: 1)  ! Sulfa  Medication Administration  Injection # 1:    Medication: Xolair (omalizumab) 150mg     Diagnosis: EXTRINSIC ASTHMA, UNSPECIFIED (ICD-493.00)    Route: SQ    Site: R deltoid    Exp Date: 06/2012    Lot #: [id number]    Mfr: Celso Amy    Comments: Injection given by Alroy Bailiff in [facility] lab. Xolair 300mg . 1.25ml x 1 in Right and Left Deltoid. Pt waited 30 minutes.     Patient tolerated injection without complications  Orders Added: 1)  Admin of Therapeutic Inj  intramuscular or subcutaneous [96372] 2)  Xolair (omalizumab) 150mg  [J2357]

## 2010-09-04 NOTE — Assessment & Plan Note (Signed)
Summary: xolair/mhh  Nurse Visit   Allergies: 1)  ! Sulfa  Medication Administration  Injection # 1:    Medication: Xolair (omalizumab) 150mg     Diagnosis: EXTRINSIC ASTHMA, UNSPECIFIED (ICD-493.00)    Route: SQ    Site: R deltoid    Exp Date: 08/2013    Lot #: [id number]    Mfr: Genetech    Comments: 1.2 ML IN RIGHT AND LEFT ARM 300MG  CHARGED [id number] AND 23762    Given by: Alroy Bailiff IN [facility]   Orders Added: 1)  Xolair (omalizumab) 150mg  [J2357] 2)  Administration xolair injection [id number]   Medication Administration  Injection # 1:    Medication: Xolair (omalizumab) 150mg     Diagnosis: EXTRINSIC ASTHMA, UNSPECIFIED (ICD-493.00)    Route: SQ    Site: R deltoid    Exp Date: 08/2013    Lot #: [id number]    Mfr: [employer]etech    Comments: 1.2 ML IN RIGHT AND LEFT ARM 300MG  CHARGED [id number] AND 83151    Given by: Alroy Bailiff [facility]   Orders Added: 1)  Xolair (omalizumab) 150mg  [J2357] 2)  Administration xolair injection [id number]

## 2010-09-04 NOTE — Assessment & Plan Note (Signed)
Summary: xolair///kp  Nurse Visit   Allergies: 1)  ! Sulfa  Medication Administration  Injection # 1:    Medication: Xolair (omalizumab) 150mg     Diagnosis: EXTRINSIC ASTHMA, UNSPECIFIED (ICD-493.00)    Route: SQ    Site: L deltoid    Exp Date: 05/2013    Lot #: [id number]    Mfr: Genetech    Comments: 1.2 ML IN LEFT AND RIGHT ARM 300 MG PT DIDNT WAIT CHARGED J2357 AND 60454    Given by: Alroy Bailiff [facility]  Orders Added: 1)  Xolair (omalizumab) 150mg  [J2357] 2)  Administration xolair injection [id number]   Medication Administration  Injection # 1:    Medication: Xolair (omalizumab) 150mg     Diagnosis: EXTRINSIC ASTHMA, UNSPECIFIED (ICD-493.00)    Route: SQ    Site: L deltoid    Exp Date: 05/2013    Lot #: [id number]    Mfr: [employer]ech    Comments: 1.2 ML IN LEFT AND RIGHT ARM 300 MG PT DIDNT WAIT CHARGED J2357 AND 09811    Given by: Lynelle Smoke [facility]  Orders Added: 1)  Xolair (omalizumab) 150mg  [J2357] 2)  Administration xolair injection [id number]

## 2010-09-04 NOTE — Assessment & Plan Note (Signed)
Summary: xolair/mhh  Nurse Visit   Allergies: 1)  ! Sulfa  Medication Administration  Injection # 1:    Medication: Xolair (omalizumab) 150mg     Diagnosis: EXTRINSIC ASTHMA, UNSPECIFIED (ICD-493.00)    Route: SQ    Site: R deltoid    Exp Date: 08/2013    Lot #: [id number]    Mfr: [employer]etech    Comments: 1.2 ML IN RIGHT AND LEFT ARM 300MG  CHARGED [id number] AND 43329    Patient tolerated injection without complications    Given by: [place]  Orders Added: 1)  Xolair (omalizumab) 150mg  [J2357] 2)  Administration xolair injection [id number]   Medication Administration  Injection # 1:    Medication: Xolair (omalizumab) 150mg     Diagnosis: EXTRINSIC ASTHMA, UNSPECIFIED (ICD-493.00)    Route: SQ    Site: R deltoid    Exp Date: 08/2013    Lot #: [id number]    Mfr: Genetech    Comments: 1.2 ML IN RIGHT AND LEFT ARM 300MG  CHARGED [id number] AND 51884    Patient tolerated injection without complications    Given by: [facility]  Orders Added: 1)  Xolair (omalizumab) 150mg  [J2357] 2)  Administration xolair injection [id number]

## 2010-09-10 NOTE — Assessment & Plan Note (Signed)
Summary: xolair  Nurse Visit   Allergies: 1)  ! Sulfa  Medication Administration  Injection # 1:    Medication: Xolair (omalizumab) 150mg     Diagnosis: EXTRINSIC ASTHMA, UNSPECIFIED (ICD-493.00)    Route: SQ    Site: L deltoid    Exp Date: 08/2013    Lot #: [id number]    Mfr: Genetech    Comments: 1.2 ML IN LEFT AND RIGHT ARM 300MG  CHARGED [id number] AND 28413    Given by: Alroy Bailiff IN [facility]  Orders Added: 1)  Xolair (omalizumab) 150mg  [J2357] 2)  Administration xolair injection [id number]   Medication Administration  Injection # 1:    Medication: Xolair (omalizumab) 150mg     Diagnosis: EXTRINSIC ASTHMA, UNSPECIFIED (ICD-493.00)    Route: SQ    Site: L deltoid    Exp Date: 08/2013    Lot #: [id number]    Mfr: Genetech    Comments: 1.2 ML IN LEFT AND RIGHT ARM 300MG  CHARGED [id number] AND 24401    Given by: [facility]  Orders Added: 1)  Xolair (omalizumab) 150mg  [J2357] 2)  Administration xolair injection [id number]

## 2010-09-12 ENCOUNTER — Ambulatory Visit (INDEPENDENT_AMBULATORY_CARE_PROVIDER_SITE_OTHER): Payer: Medicare Other

## 2010-09-12 ENCOUNTER — Encounter: Payer: Self-pay | Admitting: Critical Care Medicine

## 2010-09-12 DIAGNOSIS — J45909 Unspecified asthma, uncomplicated: Secondary | ICD-10-CM

## 2010-09-18 NOTE — Assessment & Plan Note (Signed)
Summary: Arvid Right  Nurse Visit   Allergies: 1)  ! Sulfa  Medication Administration  Injection # 1:    Medication: Xolair (omalizumab) 150mg     Diagnosis: EXTRINSIC ASTHMA, UNSPECIFIED (ICD-493.00)    Route: SQ    Site: R deltoid    Exp Date: 08/2013    Lot #: [id number]    Mfr: GENETECH    Comments: 1.2 ML IN RIGHT AND LEFT ARM 300MG  CHARGED W164934 AND 60454    Given by: Alroy Bailiff [facility]  Orders Added: 1)  Xolair (omalizumab) 150mg  [J2357] 2)  Administration xolair injection [id number]   Medication Administration  Injection # 1:    Medication: Xolair (omalizumab) 150mg     Diagnosis: EXTRINSIC ASTHMA, UNSPECIFIED (ICD-493.00)    Route: SQ    Site: R deltoid    Exp Date: 08/2013    Lot #: [id number]    Mfr: GENETECH    Comments: 1.2 ML IN RIGHT AND LEFT ARM 300MG  CHARGED W164934 AND 09811    Given by: Alroy Bailiff IN [facility]  Orders Added: 1)  Xolair (omalizumab) 150mg  [J2357] 2)  Administration xolair injection [id number]

## 2010-09-26 ENCOUNTER — Encounter: Payer: Self-pay | Admitting: Critical Care Medicine

## 2010-09-26 ENCOUNTER — Ambulatory Visit (INDEPENDENT_AMBULATORY_CARE_PROVIDER_SITE_OTHER): Payer: Medicare Other

## 2010-09-26 DIAGNOSIS — J45909 Unspecified asthma, uncomplicated: Secondary | ICD-10-CM

## 2010-09-30 NOTE — Assessment & Plan Note (Signed)
Summary: xolair//jd  Nurse Visit   Allergies: 1)  ! Sulfa  Medication Administration  Injection # 1:    Medication: Xolair (omalizumab) 150mg     Diagnosis: EXTRINSIC ASTHMA, UNSPECIFIED (ICD-493.00)    Route: SQ    Site: L deltoid    Exp Date: 10/2013    Lot #: [id number]    Mfr: Genetech    Comments: 1.2 ML IN LEFT AND RIGHT ARM 300MG  CHARGED [id number] AND 13086    Given by: Alroy Bailiff [facility]  Orders Added: 1)  Xolair (omalizumab) 150mg  [J2357] 2)  Administration xolair injection [id number]   Medication Administration  Injection # 1:    Medication: Xolair (omalizumab) 150mg     Diagnosis: EXTRINSIC ASTHMA, UNSPECIFIED (ICD-493.00)    Route: SQ    Site: L deltoid    Exp Date: 10/2013    Lot #: [id number]    Mfr: Genetech    Comments: 1.2 ML IN LEFT AND RIGHT ARM 300MG  CHARGED [id number] AND 57846    Given by: Alroy Bailiff IN [facility]  Orders Added: 1)  Xolair (omalizumab) 150mg  [J2357] 2)  Administration xolair injection [id number]

## 2010-10-13 ENCOUNTER — Ambulatory Visit (INDEPENDENT_AMBULATORY_CARE_PROVIDER_SITE_OTHER): Payer: Medicare Other

## 2010-10-13 ENCOUNTER — Encounter: Payer: Self-pay | Admitting: Critical Care Medicine

## 2010-10-13 DIAGNOSIS — J45909 Unspecified asthma, uncomplicated: Secondary | ICD-10-CM

## 2010-10-21 NOTE — Assessment & Plan Note (Signed)
Summary: XOLIAR/CB  Nurse Visit   Allergies: 1)  ! Sulfa  Medication Administration  Injection # 1:    Medication: Xolair (omalizumab) 150mg     Diagnosis: EXTRINSIC ASTHMA, UNSPECIFIED (ICD-493.00)    Route: SQ    Site: R deltoid    Exp Date: 10/2013    Lot #: [id number]    Mfr: Genetech    Comments: 1.2 ML IN RIGHT AND LEFT ARM 300MG  CHARGED [id number] AND  29562    Given by: Alroy Bailiff [facility]  Orders Added: 1)  Xolair (omalizumab) 150mg  [J2357] 2)  Administration xolair injection [id number]   Medication Administration  Injection # 1:    Medication: Xolair (omalizumab) 150mg     Diagnosis: EXTRINSIC ASTHMA, UNSPECIFIED (ICD-493.00)    Route: SQ    Site: R deltoid    Exp Date: 10/2013    Lot #: [id number]    Mfr: Genetech    Comments: 1.2 ML IN RIGHT AND LEFT ARM 300MG  CHARGED [id number] AND  13086    Given by: Alroy Bailiff IN [facility]  Orders Added: 1)  Xolair (omalizumab) 150mg  [J2357] 2)  Administration xolair injection [id number]

## 2010-10-27 ENCOUNTER — Ambulatory Visit (INDEPENDENT_AMBULATORY_CARE_PROVIDER_SITE_OTHER): Payer: Medicare Other

## 2010-10-27 DIAGNOSIS — J45909 Unspecified asthma, uncomplicated: Secondary | ICD-10-CM

## 2010-10-28 MED ORDER — OMALIZUMAB 150 MG ~~LOC~~ SOLR
300.0000 mg | Freq: Once | SUBCUTANEOUS | Status: AC
  Administered 2010-10-27: 300 mg via SUBCUTANEOUS

## 2010-11-11 ENCOUNTER — Ambulatory Visit (INDEPENDENT_AMBULATORY_CARE_PROVIDER_SITE_OTHER): Payer: Medicare Other

## 2010-11-11 DIAGNOSIS — J45909 Unspecified asthma, uncomplicated: Secondary | ICD-10-CM

## 2010-11-11 MED ORDER — OMALIZUMAB 150 MG ~~LOC~~ SOLR
300.0000 mg | Freq: Once | SUBCUTANEOUS | Status: AC
  Administered 2010-11-11: 300 mg via SUBCUTANEOUS

## 2010-11-24 ENCOUNTER — Ambulatory Visit (INDEPENDENT_AMBULATORY_CARE_PROVIDER_SITE_OTHER): Payer: Medicare Other

## 2010-11-24 DIAGNOSIS — J45909 Unspecified asthma, uncomplicated: Secondary | ICD-10-CM

## 2010-11-24 MED ORDER — OMALIZUMAB 150 MG ~~LOC~~ SOLR
300.0000 mg | Freq: Once | SUBCUTANEOUS | Status: AC
  Administered 2010-11-24: 300 mg via SUBCUTANEOUS

## 2010-12-08 ENCOUNTER — Ambulatory Visit (INDEPENDENT_AMBULATORY_CARE_PROVIDER_SITE_OTHER): Payer: Medicare Other

## 2010-12-08 DIAGNOSIS — J45909 Unspecified asthma, uncomplicated: Secondary | ICD-10-CM

## 2010-12-08 MED ORDER — OMALIZUMAB 150 MG ~~LOC~~ SOLR
300.0000 mg | Freq: Once | SUBCUTANEOUS | Status: AC
  Administered 2010-12-08: 300 mg via SUBCUTANEOUS

## 2010-12-16 NOTE — Assessment & Plan Note (Signed)
[facility]                             PULMONARY OFFICE NOTE   DANIELY, AHUJA                      MRN:          [id number]  DATE:06/20/2007                            DOB:          1924/05/07    The patient is an 77 year old white female with history of moderate  persistent asthma, hypertension, allergic rhinitis, significant atopy,  recent IgE assay showed an elevated level of IgE up to 491.  Her RAST  assay was negative.  She maintains Advair 500/50 one spray b.i.d.,  Singulair 10 mg daily, Flonase two sprays each nostril daily.  Despite  this she continues to have episodes of shortness of breath, cough, and  wheezing.   PHYSICAL EXAMINATION:  VITAL SIGNS:  Temperature 97, blood pressure  114/70, pulse 63, saturation 98% on room air.  CHEST:  Inspiratory and expiratory wheeze with poor air flow.  HEART:  Regular rate and rhythm without S3.  Normal S1 and S2.  ABDOMEN:  Soft and nontender.  EXTREMITIES:  No edema or clubbing.  SKIN:  Clear.  NEUROLOGY:  Intact.  HEENT:  No jugular venous distention, no lymphadenopathy.  Oropharynx  clear.  NECK:  Supple.   IMPRESSION:  Asthmatic bronchitis with significant atopic features,  chronic air flow obstruction.   PLAN:  The patient will begin Xolair.  We will have the patient  qualified for same.  The patient is to receive pulse prednisone 40 mg a  day with a rapid taper and we will see the patient back in follow-up in  six weeks.     Burnett Harry Joya Gaskins, MD, [facility]  Electronically Signed    PEW/MedQ  DD: 06/20/2007  DT: 06/21/2007  Job #: [id number]   cc:   [name], M.D.

## 2010-12-16 NOTE — Assessment & Plan Note (Signed)
[facility]                             PULMONARY OFFICE NOTE   MELODEY, HOULTON                      MRN:          [id number]  DATE:05/13/2007                            DOB:          1924/06/13    Mr. Monacelli returns in followup.  This is an 77 year old white female  with a history of moderate persistent asthma, hypertension, allergic  rhinitis.  She is still coughing several times a day, still having some  degree of dyspnea.  She did have a productive cough with thick yellow  mucus but took a round of prednisone and Avelox in September and this is  now improved.  She is still noting some wheezing.   She maintains:  1. Advair 250/50, one spray b.i.d.  2. Prilosec 20 mg daily.  3. Flonase 2 sprays each nostril daily.  4. Singulair 10 mg daily.  5. Other maintenance medicines listed in the chart, correct as      reviewed.   PHYSICAL EXAMINATION:  VITAL SIGNS:  Temp 98, blood pressure 120/68,  pulse 71, saturation 99% on room air.  CHEST:  Showed a few expired wheezes with poor air flow.  CARDIAC:  Showed a regular rate and rhythm without S3.  Normal S1 S2.  ABDOMEN:  Soft, nontender.  EXTREMITIES:  Showed no edema or clubbing.  SKIN:  Clear.   IMPRESSION:  Moderate persistent asthma with flare.   PLAN:  For the patient to obtain a RAST IgE assay and increase Advair to  250/50 one spray b.i.d. and we will see the patient back in followup for  this condition.     Burnett Harry Joya Gaskins, MD, [facility]  Electronically Signed    PEW/MedQ  DD: 05/13/2007  DT: 05/13/2007  Job #: [id number]

## 2010-12-19 NOTE — Assessment & Plan Note (Signed)
[facility]                             PULMONARY OFFICE NOTE   TAMIEKA, REEB                      MRN:          [id number]  DATE:08/23/2006                            DOB:          [birth year]    Ms. Weintraub is an 77 year old white female, history of shortness of  breath, cough, and congestion.  She had an upper respiratory tract  infection before the holidays.  She stopped the Brovex because it caused  gas and loose stools.   She maintains:  1. Advair 250/50, one spray b.i.d.  2. Prilosec 20 mg daily.  3. Flonase 2 sprays each nostril daily.  4. Other maintenance medicines listed on the chart are correct as      reviewed.  Albuterol is p.r.n.   She is noting increased cough, increased chest congestion.   PHYSICAL EXAMINATION:  VITAL SIGNS:  Temp 97, blood pressure 112/74,  pulse 80, saturation 99% on room air.  CHEST:  Showed a few expired wheezes.  No rhonchi.  CARDIAC:  Showed a regular rate and rhythm without S3.  Normal S1 S2.  ABDOMEN:  Soft, nontender.  EXTREMITIES:  Showed no edema or clubbing.  SKIN:  Clear.  HEENT:  Nares showed increased nasal inflammation without purulence.   Chest x-ray, obtained today, showed no active disease process.   IMPRESSION:  1. Post nasal drip syndrome.  2. Allergic rhinitis.  3. Chronic rhinitis.  4. Mild asthma flare.  5. Early sinusitis.  6. Early bronchitis.   PLAN:  The patient is to receive a Z-Pak 500 mg for 3 days then stop  once daily.  The patient received prednisone 40 mg a day taper down by  10 mg every two days till off.  The patient is to maintain Advair and  Prilosec as currently dosed.  Continue Flonase.  The patient to receive  tramadol 50 mg q.6 h. x20 tablets for cough suppression.  The patient to  receive Mucinex DM 1 b.i.d. x1 bottle's worth and we will see the  patient back in followup in two months.     Burnett Harry Joya Gaskins, MD, [facility]  Electronically Signed    PEW/MedQ  DD: 08/23/2006  DT: 08/23/2006  Job #: [id number]   cc:   [name], M.D.

## 2010-12-19 NOTE — Assessment & Plan Note (Signed)
[facility]                               PULMONARY OFFICE NOTE   EUNA, KENDER                      MRN:          [id number]  DATE:06/03/2006                            DOB:          April 04, 1924    Ms. [name] is an 77 year old white female, history of asthmatic  bronchitis, allergic rhinitis.  The patient has minimal cough, minimal mucus  production.   Maintains:  1. Advair 250/50 1 spray b.i.d.  2. Prilosec 20 mg daily.  3. Flonase 2 sprays each nostril daily.  4. Other maintenance medicines are correct as reviewed.   She is complaining of increased wheezing, increased cough, increased mucus  production as noted.   PHYSICAL EXAMINATION:  Temp 98, blood pressure 118/78, pulse 69, saturation  95% on room air.  CHEST:  Showed inspiratory and expiratory wheeze with poor airflow.  CARDIAC:  Exam showed a regular rate and rhythm without S3, normal S1, S2.  ABDOMEN:  Soft, nontender.  EXTREMITIES:  Showed no edema or clubbing or venous disease.  NEUROLOGIC:  Exam was intact.  HEENT:  Exam showed nasal purulence, right greater than left nares.   IMPRESSION:  Acute-on-chronic sinusitis, right greater than left maxillary  sinus, with asthmatic bronchitic flare.   PLAN:  1. Is for the patient to receive pulsed prednisone 40 mg a day with a      rapid taper.  2. The patient will receive Augmentin 875 mg twice daily for 10 days.  3. Will receive saline nasal wash.  4. She will maintain Advair, Prilosec, Flonase as is.  5. We will see the patient back in return followup.     Burnett Harry Joya Gaskins, MD, [facility]  Electronically Signed    PEW/MedQ  DD: 06/03/2006  DT: 06/04/2006  Job #: [id number]   cc:   [name], M.D.

## 2010-12-19 NOTE — Assessment & Plan Note (Signed)
[facility]                             PULMONARY OFFICE NOTE   AVRYL, SINIBALDI                      MRN:          [id number]  DATE:11/23/2006                            DOB:          1923/12/13    [name] is an 77 year old white female with history of asthmatic  bronchitis, allergic rhinitis.  She is still having allergic rhinitis  symptoms, is having to use a Flonase daily.  Uses over-the-counter  Claritin with some relief.  Pain maintains Advair 250/50 one spray  b.i.d. and Prilosec 20 mg daily.   PHYSICAL EXAMINATION:  VITAL SIGNS:  Temperature 98, blood pressure  118/74, pulse 71, saturation was 98% on room air.  HEENT:  Nares did show mild nasal inflammation without purulence.  CHEST:  Distant breath sounds without evidence of wheeze or rhonchi.  There was no evidence of rales.  CARDIOVASCULAR:  Regular rate and rhythm without S3, normal S1 and S2.  ABDOMEN:  Soft and nontender.  EXTREMITIES:  No edema or clubbing.  SKIN: Clear.   IMPRESSION:  This patient is that of asthmatic bronchitis with allergic  rhinitis.   PLAN:  Maintain Flonase, Advair, Claritin as prescribed.  Patient will  use nasal saline lavage as well.  Will see the patient back in follow-up  in three months.     Julia Harry Joya Gaskins, MD, [facility]  Electronically Signed    PEW/MedQ  DD: 11/23/2006  DT: 11/23/2006  Job #: [id number]   cc:   [name], M.D.

## 2010-12-19 NOTE — Assessment & Plan Note (Signed)
[facility]                               PULMONARY OFFICE NOTE   [name], [name]                      MRN:          [id number]  DATE:04/14/2006                            DOB:          29-Sep-1923    [name] returns today in follow up.  Is an 77 year old white female,  history of asthmatic bronchitis, here today for follow up.  Her level of  dyspnea is improved.  Her wheezing is improved.  Does complain of lots of  postnasal drainage.  Maintains albuterol p.r.n., Advair 250/50 one spray  b.i.d., Prilosec 20 mg daily.  Other maintenance medicines listed in the  chart are correct as reviewed.   EXAM:  Temperature 97, blood pressure 120/70, pulse 65, saturation was 99%  room air.  CHEST:  Showed diminished breath sounds with no evidence of wheeze or  rhonchi.  CARDIAC EXAM:  Showed a regular rate and rhythm without S3, normal S1, S2.  ABDOMEN:  Soft, nontender.  EXTREMITIES:  No edema or clubbing.  SKIN:  Clear.  NEUROLOGIC EXAM:  Intact.  HEENT EXAM:  Showed no jugular venous distention, lymphadenopathy.  OROPHARYNX:  Clear.  NECK:  Supple.   IMPRESSION:  Mild persistent asthma with notice made of recent pulmonary  function showing an FEV1 of 91% predicted, FVC of 93% predicted, the FEV1,  FVC ratio of 66%, the FEF25/75 is low at 40% of predicted, diffusion  capacity normal at 83% of predicted, lung capacity at 88% predicted.   PLAN:  1. To maintain Advair as currently dosed 250/50 one spray b.i.d.  2. Begin BroveX 5 mL b.i.d. for antihistamine properties.  3. Discontinue further Clarinex.  4. Maintain Nasacort.  5. Will see the patient back in follow up in 3 months.                                   Burnett Harry Joya Gaskins, MD, [facility]   PEW/MedQ  DD:  04/14/2006  DT:  04/15/2006  Job #:  [id number]   cc:   [name], M.D.

## 2010-12-19 NOTE — Assessment & Plan Note (Signed)
[facility]                             PULMONARY OFFICE NOTE   [name], [name]                      MRN:          [id number]  DATE:09/28/2006                            DOB:          Jan 22, 1924    [name] is an 77 year old white female with history of asthmatic  bronchitis, overall doing well with minimal complaints.  She is  resolving her bronchitic spell from January.  Maintaining Advair 250/50  one spray b.i.d., Prilosec 20 mg daily, Flonase 2 sprays each nostril  daily.  Other maintenance medicines listed in the chart and correct as  reviewed.   EXAMINATION:  Temperature 97.9, blood pressure 120/70, pulse 67,  saturation 99% on room air.  GENERAL:  This is a well-developed, well-nourished white female, elderly  in no distress.  CHEST:  Showed distant breath sounds.  No evidence of active wheeze,  rale or rhonchi.  CARDIAC:  Exam showed a regular rate and rhythm without S3.  Normal S1-  S2.  ABDOMEN:  Soft and nontender.  EXTREMITIES:  Showed no edema or clubbing.  SKIN:  Clear.  NEUROLOGICAL:  Intact.  HEENT:  Exam showed no jugular venous distention, no lymphadenopathy.  Oropharynx is clear.  Neck supple.   IMPRESSION:  1. Asthmatic bronchitis.  2. Chronic obstructive airway disease.   PLAN:  For the patient to maintain inhaled medicines as currently dosed  without changing plan of care and we will see the patient back in return  follow-up.     Burnett Harry Joya Gaskins, MD, [facility]  Electronically Signed    PEW/MedQ  DD: 09/28/2006  DT: 09/29/2006  Job #: [id number]

## 2010-12-19 NOTE — Assessment & Plan Note (Signed)
[facility]                               PULMONARY OFFICE NOTE   [name], [name]                      MRN:          [id number]  DATE:02/10/2006                            DOB:          [birth year]    This is an 77 year old white female, history of moderate persistent asthma  which she has had since her teenage years.  She recently moved to [place]  from [place], [place], 4 years ago, to the [employer].  Dr. Osborne Casco became her primary care [name] at that time.  She had  asthma diagnosed for many years.  It began as a teenager with allergies, and  then in her adult age, began having an asthma diagnosis 40 years ago.  She  is a lifelong never-smoker.  She has had sinus surgery twice, once in 1970,  the second time in 1992.   Her program includes that of:  1.  Advair 250/50 1 spray b.i.d.  2.  Albuterol nebs on a q.i.d. scheduled basis  3.  Alavert daily  4.  Nasacort daily  5.  Nasalcrom daily   She had really not much difficulty until over the winter, when she had more  problems with shortness of breath requiring more of her rescue inhaler, and  she has been on scheduled rescue inhalers since.  She is short of breath now  with activity, but not at rest.  She denies any significant cough, except  the cough is mostly dry.  There is no hemoptysis, no chest pain, no  irregular heartbeats, there is no acid reflux symptoms, no change in weight,  no abdominal pain.  The patient denies difficulty swallowing, sore throat.  There have been no headaches, there has been no nasal congestion, sneezing,  itching or ear ache.   PAST MEDICAL HISTORY:  1.  Hypertension.  2.  History of asthma.  3.  History of hypercholesterolemia.  No history of strokes, blood clots or cancer.  No history of angina or heart  failure.   OPERATIVE HISTORY:  Includes:  1.  The sinus surgery in 1970 and 1992.  2.  History of hysterectomy  in 1975.  3.  Breast biopsies in the past.  4.  Appendectomy in the past.  5.  Hysterectomy in the past.  6.  Bladder surgery.   CURRENT MEDICATIONS:  Includes:  1.  Norvasc 5 mg daily.  2.  Cozaar 100 mg daily.  3.  Paxil 20 mg daily.  4.  HCTZ 25 mg daily .  5.  Advair 250/50 1 spray b.i.d.  6.  Nasacort 1 spray b.i.d.  7.  Nasalcrom 1 spray b.i.d.  8.  Centrum Silver daily.  9.  Os-Cal 2 daily.  10. Albuterol nebs q.i.d.  11. Alavert daily.   MEDICATION ALLERGIES:  None.   SOCIAL HISTORY:  Lives in a retirement home with her husband, is a lifelong  never-smoker.   FAMILY HISTORY:  Asthma in her brother.   REVIEW OF SYSTEMS:  Otherwise noncontributory.   PHYSICAL EXAMINATION:  This is an elderly white female in  no distress.  Temperature 97.5, blood pressure 140/72, pulse 66, saturation 98% on room  air.  CHEST:  Shows distant breath sounds with no evidence of wheeze or rhonchi.  CARDIAC:  Exam showed a regular rate and rhythm without S3, normal S1, S2.  ABDOMEN:  Soft, nontender, bowel sounds active.  EXTREMITIES:  Showed no edema or clubbing.  SKIN:  Clear.  NEUROLOGIC:  Exam was intact.  HEENT:  Exam showed no jugular venous distention, lymphadenopathy.  OROPHARYNX:  Clear.  NECK:  Supple.   IMPRESSION:  Here would be that of moderate persistent asthma, stable at  this time, with allergic rhinitis.   RECOMMENDATIONS:  1.  Discontinue the Nasalcrom.  2.  Change Nasacort to 2 sprays daily.  3.  Continue Advair 250/50 1 spray b.i.d.  We reaffirmed the patient's      proper technique with the DPI.  4.  Reduce albuterol to p.r.n. use only.  5.  Obtain full set of pulmonary function studies and a chest x-ray.  6.  Once the results of the above are available, further recommendations      will follow.                                  Burnett Harry Joya Gaskins, MD, [facility]   PEW/MedQ  DD:  02/10/2006  DT:  02/11/2006  Job #:  [id number]   cc:   [name], MD

## 2010-12-22 ENCOUNTER — Ambulatory Visit (INDEPENDENT_AMBULATORY_CARE_PROVIDER_SITE_OTHER): Payer: Medicare Other

## 2010-12-22 DIAGNOSIS — J45909 Unspecified asthma, uncomplicated: Secondary | ICD-10-CM

## 2010-12-23 MED ORDER — OMALIZUMAB 150 MG ~~LOC~~ SOLR
300.0000 mg | Freq: Once | SUBCUTANEOUS | Status: AC
  Administered 2010-12-23: 300 mg via SUBCUTANEOUS

## 2011-01-05 ENCOUNTER — Encounter: Payer: Self-pay | Admitting: Internal Medicine

## 2011-01-05 ENCOUNTER — Ambulatory Visit (INDEPENDENT_AMBULATORY_CARE_PROVIDER_SITE_OTHER): Payer: Medicare Other

## 2011-01-05 DIAGNOSIS — J45909 Unspecified asthma, uncomplicated: Secondary | ICD-10-CM

## 2011-01-06 MED ORDER — OMALIZUMAB 150 MG ~~LOC~~ SOLR
300.0000 mg | Freq: Once | SUBCUTANEOUS | Status: AC
  Administered 2011-01-06: 300 mg via SUBCUTANEOUS

## 2011-01-10 ENCOUNTER — Other Ambulatory Visit: Payer: Self-pay | Admitting: Critical Care Medicine

## 2011-01-16 ENCOUNTER — Emergency Department (HOSPITAL_COMMUNITY): Payer: Medicare Other

## 2011-01-16 ENCOUNTER — Encounter (HOSPITAL_COMMUNITY): Payer: Self-pay | Admitting: Radiology

## 2011-01-16 ENCOUNTER — Emergency Department (HOSPITAL_COMMUNITY)
Admission: EM | Admit: 2011-01-16 | Discharge: 2011-01-16 | Disposition: A | Discharge status: Home / Self Care | Payer: Medicare Other | Attending: Emergency Medicine | Admitting: Emergency Medicine

## 2011-01-16 DIAGNOSIS — IMO0002 Reserved for concepts with insufficient information to code with codable children: Secondary | ICD-10-CM | POA: Insufficient documentation

## 2011-01-16 DIAGNOSIS — I10 Essential (primary) hypertension: Secondary | ICD-10-CM | POA: Insufficient documentation

## 2011-01-16 DIAGNOSIS — J45909 Unspecified asthma, uncomplicated: Secondary | ICD-10-CM | POA: Insufficient documentation

## 2011-01-16 DIAGNOSIS — I4510 Unspecified right bundle-branch block: Secondary | ICD-10-CM | POA: Insufficient documentation

## 2011-01-16 DIAGNOSIS — R296 Repeated falls: Secondary | ICD-10-CM | POA: Insufficient documentation

## 2011-01-16 DIAGNOSIS — S0990XA Unspecified injury of head, initial encounter: Secondary | ICD-10-CM | POA: Insufficient documentation

## 2011-01-16 DIAGNOSIS — G319 Degenerative disease of nervous system, unspecified: Secondary | ICD-10-CM | POA: Insufficient documentation

## 2011-01-16 DIAGNOSIS — E871 Hypo-osmolality and hyponatremia: Secondary | ICD-10-CM | POA: Insufficient documentation

## 2011-01-16 DIAGNOSIS — S0003XA Contusion of scalp, initial encounter: Secondary | ICD-10-CM | POA: Insufficient documentation

## 2011-01-16 HISTORY — DX: Essential (primary) hypertension: I10

## 2011-01-16 LAB — DIFFERENTIAL
Basophils Relative: 0 % (ref 0–1)
Eosinophils Absolute: 0.1 10*3/uL (ref 0.0–0.7)
Monocytes Relative: 7 % (ref 3–12)
Neutrophils Relative %: 84 % — ABNORMAL HIGH (ref 43–77)

## 2011-01-16 LAB — URINALYSIS, ROUTINE W REFLEX MICROSCOPIC
Glucose, UA: NEGATIVE mg/dL
Ketones, ur: NEGATIVE mg/dL
Leukocytes, UA: NEGATIVE
Nitrite: NEGATIVE
Protein, ur: NEGATIVE mg/dL
pH: 7 (ref 5.0–8.0)

## 2011-01-16 LAB — CBC
Platelets: 244 10*3/uL (ref 150–400)
RBC: 3.74 MIL/uL — ABNORMAL LOW (ref 3.87–5.11)
RDW: 13.3 % (ref 11.5–15.5)
WBC: 9.4 10*3/uL (ref 4.0–10.5)

## 2011-01-16 LAB — BASIC METABOLIC PANEL
CO2: 27 mEq/L (ref 19–32)
Chloride: 91 mEq/L — ABNORMAL LOW (ref 96–112)
Creatinine, Ser: 0.79 mg/dL (ref 0.50–1.10)
GFR calc Af Amer: >60 mL/min (ref >60)
Sodium: 126 mEq/L — ABNORMAL LOW (ref 135–145)

## 2011-01-16 LAB — TROPONIN I: Troponin I: <0.3 ng/mL (ref <0.30)

## 2011-01-16 LAB — CK TOTAL AND CKMB (NOT AT ARMC): Total CK: 131 U/L (ref 7–177)

## 2011-01-17 LAB — URINE CULTURE
Colony Count: NO GROWTH
Culture  Setup Time: 201206151630

## 2011-01-26 ENCOUNTER — Encounter (INDEPENDENT_AMBULATORY_CARE_PROVIDER_SITE_OTHER): Payer: Medicare Other

## 2011-01-26 ENCOUNTER — Ambulatory Visit (INDEPENDENT_AMBULATORY_CARE_PROVIDER_SITE_OTHER): Payer: Medicare Other

## 2011-01-26 DIAGNOSIS — R55 Syncope and collapse: Secondary | ICD-10-CM

## 2011-01-26 DIAGNOSIS — J45909 Unspecified asthma, uncomplicated: Secondary | ICD-10-CM

## 2011-01-26 MED ORDER — OMALIZUMAB 150 MG ~~LOC~~ SOLR
300.0000 mg | Freq: Once | SUBCUTANEOUS | Status: AC
  Administered 2011-01-26: 300 mg via SUBCUTANEOUS

## 2011-02-09 ENCOUNTER — Ambulatory Visit (INDEPENDENT_AMBULATORY_CARE_PROVIDER_SITE_OTHER): Payer: Medicare Other

## 2011-02-09 DIAGNOSIS — J45909 Unspecified asthma, uncomplicated: Secondary | ICD-10-CM

## 2011-02-10 MED ORDER — OMALIZUMAB 150 MG ~~LOC~~ SOLR
300.0000 mg | Freq: Once | SUBCUTANEOUS | Status: AC
  Administered 2011-02-10: 300 mg via SUBCUTANEOUS

## 2011-02-23 ENCOUNTER — Ambulatory Visit (INDEPENDENT_AMBULATORY_CARE_PROVIDER_SITE_OTHER): Payer: Medicare Other

## 2011-02-23 DIAGNOSIS — J45909 Unspecified asthma, uncomplicated: Secondary | ICD-10-CM

## 2011-02-24 MED ORDER — OMALIZUMAB 150 MG ~~LOC~~ SOLR
300.0000 mg | Freq: Once | SUBCUTANEOUS | Status: AC
  Administered 2011-02-24: 300 mg via SUBCUTANEOUS

## 2011-03-09 ENCOUNTER — Other Ambulatory Visit: Payer: Self-pay | Admitting: Critical Care Medicine

## 2011-03-09 ENCOUNTER — Ambulatory Visit (INDEPENDENT_AMBULATORY_CARE_PROVIDER_SITE_OTHER): Payer: Medicare Other

## 2011-03-09 DIAGNOSIS — J45909 Unspecified asthma, uncomplicated: Secondary | ICD-10-CM

## 2011-03-11 MED ORDER — OMALIZUMAB 150 MG ~~LOC~~ SOLR
300.0000 mg | Freq: Once | SUBCUTANEOUS | Status: AC
  Administered 2011-03-11: 300 mg via SUBCUTANEOUS

## 2011-03-23 ENCOUNTER — Ambulatory Visit (INDEPENDENT_AMBULATORY_CARE_PROVIDER_SITE_OTHER): Payer: Medicare Other

## 2011-03-23 DIAGNOSIS — J45909 Unspecified asthma, uncomplicated: Secondary | ICD-10-CM

## 2011-03-23 MED ORDER — OMALIZUMAB 150 MG ~~LOC~~ SOLR
300.0000 mg | Freq: Once | SUBCUTANEOUS | Status: AC
  Administered 2011-03-23: 300 mg via SUBCUTANEOUS

## 2011-03-27 ENCOUNTER — Encounter: Payer: Self-pay | Admitting: Critical Care Medicine

## 2011-03-27 ENCOUNTER — Encounter: Payer: Self-pay | Admitting: *Deleted

## 2011-03-30 ENCOUNTER — Encounter: Payer: Self-pay | Admitting: Critical Care Medicine

## 2011-03-30 ENCOUNTER — Ambulatory Visit (INDEPENDENT_AMBULATORY_CARE_PROVIDER_SITE_OTHER): Payer: Medicare Other | Admitting: Critical Care Medicine

## 2011-03-30 VITALS — BP 134/68 | HR 60 | Temp 98.3°F | Ht 63.0 in | Wt 137.8 lb

## 2011-03-30 DIAGNOSIS — J45909 Unspecified asthma, uncomplicated: Secondary | ICD-10-CM

## 2011-03-30 DIAGNOSIS — Z23 Encounter for immunization: Secondary | ICD-10-CM

## 2011-03-30 MED ORDER — CETIRIZINE HCL 10 MG PO TABS: 10.0000 mg | ORAL_TABLET | Freq: Every day | ORAL | Status: DC

## 2011-03-30 MED ORDER — FLUTICASONE PROPIONATE 50 MCG/ACT NA SUSP: 2.0000 | Freq: Every day | NASAL | Status: DC

## 2011-03-30 MED ORDER — ADVAIR DISKUS 250-50 MCG/DOSE IN AEPB: 1.0000 | INHALATION_SPRAY | Freq: Two times a day (BID) | RESPIRATORY_TRACT | Status: DC

## 2011-03-30 MED ORDER — MONTELUKAST SODIUM 10 MG PO TABS: 10.0000 mg | ORAL_TABLET | Freq: Every day | ORAL | Status: DC

## 2011-03-30 NOTE — Progress Notes (Signed)
Subjective:    Patient ID: [name], female    DOB: [birth year], 77 y.o.   MRN: [id number]  HPI This is a 77 y.o.  white female, history of asthmatic bronchitis, severe, persistent. Severe Atopy on Xolair.     03/30/2011 Not seen since 11/11.   Pt has done well since last OV.  No new pulm issues.  Pt did have some sneezing and sore throat and coughing then with salt gargle and mucinex and this helped.  Still with a dry hacky cough.  No real chest pain.  Notes some muscle pain.  No real wheeze. Asthma History:   Symptoms  0-2 days/week         Nighttime Awakenings  0-2/month         Asthma interference with normal activity  No limitations         SABA use (not for EIB)  0-2 days/wk         Risk: Exacerbations requiring oral systemic steroids  0-1 / year            Past Medical History  Diagnosis Date  . Asthma   . Hypertension   . Arthritis   . Depression   . Hyperlipidemia      Family History  Problem Relation Age of Onset  . Asthma    . Colon cancer Father      History   Social History  . Marital Status: Married    Spouse Name: N/A    Number of Children: N/A  . Years of Education: N/A   Occupational History  . retired    Social History Main Topics  . Smoking status: Never Smoker   . Smokeless tobacco: Never Used  . Alcohol Use: Yes  . Drug Use: No  . Sexually Active: Not on file   Other Topics Concern  . Not on file   Social History Narrative  . No narrative on file     Allergies  Allergen Reactions  . Sulfonamide Derivatives      Outpatient Prescriptions Prior to Visit  Medication Sig Dispense Refill  . acetaminophen (TYLENOL) 500 MG tablet Take 500 mg by mouth every 4 (four) hours as needed.        Marland Kitchen ADVAIR DISKUS 250-50 MCG/DOSE AEPB USE 1 PUFF TWICE DAILY  60 each  1  . albuterol (PROVENTIL HFA;VENTOLIN HFA) 108 (90 BASE) MCG/ACT inhaler Inhale 2 puffs into the lungs every 6 (six) hours as needed.        Marland Kitchen amLODipine (NORVASC) 5 MG tablet  Take 5 mg by mouth daily.        . calcium-vitamin D (OSCAL WITH D) 500-200 MG-UNIT per tablet Take 1 tablet by mouth daily.        . cetirizine (ZYRTEC) 10 MG tablet Take 10 mg by mouth daily.        Marland Kitchen FLUoxetine (PROZAC) 20 MG capsule Take 40 mg by mouth daily.       . fluticasone (FLONASE) 50 MCG/ACT nasal spray USE 2 PUFFS IN EACH NOSTRIL   ONCE DAILY  16 g  2  . hydrochlorothiazide 25 MG tablet Take 25 mg by mouth daily.        . montelukast (SINGULAIR) 10 MG tablet Take 10 mg by mouth at bedtime.        . Multiple Vitamin (MULTIVITAMIN) tablet Take 1 tablet by mouth daily.        Marland Kitchen olmesartan (BENICAR) 40 MG tablet Take 40 mg by  mouth daily.        Marland Kitchen omalizumab (XOLAIR) 150 MG injection 300 mg every 2 weeks          Review of Systems Constitutional:   No  weight loss, night sweats,  Fevers, chills, fatigue, lassitude. HEENT:   No headaches,  Difficulty swallowing,  Tooth/dental problems,  Sore throat,                No sneezing, itching, ear ache, nasal congestion, post nasal drip,   CV:  No chest pain,  Orthopnea, PND, swelling in lower extremities, anasarca, dizziness, palpitations  GI  No heartburn, indigestion, abdominal pain, nausea, vomiting, diarrhea, change in bowel habits, loss of appetite  Resp: No shortness of breath with exertion or at rest.  No excess mucus, no productive cough,  No non-productive cough,  No coughing up of blood.  No change in color of mucus.  No wheezing.  No chest wall deformity  Skin: no rash or lesions.  GU: no dysuria, change in color of urine, no urgency or frequency.  No flank pain.  MS:  No joint pain or swelling.  No decreased range of motion.  No back pain.  Psych:  No change in mood or affect. No depression or anxiety.  No memory loss.     Objective:   Physical Exam  Filed Vitals:   03/30/11 1501  BP: 134/68  Pulse: 60  Temp: 98.3 F (36.8 C)  TempSrc: Oral  Height: 5\' 3"  (1.6 m)  Weight: 137 lb 12.8 oz (62.506 kg)  SpO2: 96%     Gen: Pleasant, well-nourished, in no distress,  normal affect  ENT: No lesions,  mouth clear,  oropharynx clear, no postnasal drip  Neck: No JVD, no TMG, no carotid bruits  Lungs: No use of accessory muscles, no dullness to percussion, clear without rales or rhonchi  Cardiovascular: RRR, heart sounds normal, no murmur or gallops, no peripheral edema  Abdomen: soft and NT, no HSM,  BS normal  Musculoskeletal: No deformities, no cyanosis or clubbing  Neuro: alert, non focal  Skin: Warm, no lesions or rashes       Assessment & Plan:   EXTRINSIC ASTHMA, UNSPECIFIED Stable moderate persistent asthma with atopic features Plan No change in inhaled or maintenance medications. Return in   4 months Flu vaccine today    Updated Medication List Outpatient Encounter Prescriptions as of 03/30/2011  Medication Sig Dispense Refill  . acetaminophen (TYLENOL) 500 MG tablet Take 500 mg by mouth every 4 (four) hours as needed.        Marland Kitchen ADVAIR DISKUS 250-50 MCG/DOSE AEPB Take 1 puff by mouth 2 (two) times daily.  60 each  6  . albuterol (PROVENTIL HFA;VENTOLIN HFA) 108 (90 BASE) MCG/ACT inhaler Inhale 2 puffs into the lungs every 6 (six) hours as needed.        Marland Kitchen amLODipine (NORVASC) 5 MG tablet Take 5 mg by mouth daily.        . calcium-vitamin D (OSCAL WITH D) 500-200 MG-UNIT per tablet Take 1 tablet by mouth daily.        . cetirizine (ZYRTEC) 10 MG tablet Take 1 tablet (10 mg total) by mouth daily.  30 tablet  6  . FLUoxetine (PROZAC) 20 MG capsule Take 40 mg by mouth daily.       . fluticasone (FLONASE) 50 MCG/ACT nasal spray Place 2 sprays into the nose daily.  16 g  6  . hydrochlorothiazide 25 MG tablet Take  25 mg by mouth daily.        . Multiple Vitamin (MULTIVITAMIN) tablet Take 1 tablet by mouth daily.        Marland Kitchen olmesartan (BENICAR) 40 MG tablet Take 40 mg by mouth daily.        Marland Kitchen omalizumab (XOLAIR) 150 MG injection 300 mg every 2 weeks       . DISCONTD: ADVAIR DISKUS 250-50  MCG/DOSE AEPB USE 1 PUFF TWICE DAILY  60 each  1  . DISCONTD: cetirizine (ZYRTEC) 10 MG tablet Take 10 mg by mouth daily.        Marland Kitchen DISCONTD: fluticasone (FLONASE) 50 MCG/ACT nasal spray USE 2 PUFFS IN EACH NOSTRIL   ONCE DAILY  16 g  2  . DISCONTD: montelukast (SINGULAIR) 10 MG tablet Take 10 mg by mouth at bedtime.        . montelukast (SINGULAIR) 10 MG tablet Take 1 tablet (10 mg total) by mouth daily.  30 tablet  6

## 2011-03-30 NOTE — Patient Instructions (Signed)
Refills on all medications for asthma sent Singulair refill is now generic No other medication changes Flu vaccine was given today Return 6 months

## 2011-03-31 NOTE — Assessment & Plan Note (Signed)
Stable moderate persistent asthma with atopic features Plan No change in inhaled or maintenance medications. Return in   4 months Flu vaccine today

## 2011-04-08 ENCOUNTER — Ambulatory Visit (INDEPENDENT_AMBULATORY_CARE_PROVIDER_SITE_OTHER): Payer: Medicare Other

## 2011-04-08 DIAGNOSIS — J45909 Unspecified asthma, uncomplicated: Secondary | ICD-10-CM

## 2011-04-09 DIAGNOSIS — J45909 Unspecified asthma, uncomplicated: Secondary | ICD-10-CM

## 2011-04-09 MED ORDER — OMALIZUMAB 150 MG ~~LOC~~ SOLR
300.0000 mg | Freq: Once | SUBCUTANEOUS | Status: AC
  Administered 2011-04-09: 300 mg via SUBCUTANEOUS

## 2011-04-22 ENCOUNTER — Ambulatory Visit (INDEPENDENT_AMBULATORY_CARE_PROVIDER_SITE_OTHER): Payer: Medicare Other

## 2011-04-22 DIAGNOSIS — J45909 Unspecified asthma, uncomplicated: Secondary | ICD-10-CM

## 2011-04-22 MED ORDER — OMALIZUMAB 150 MG ~~LOC~~ SOLR
300.0000 mg | Freq: Once | SUBCUTANEOUS | Status: AC
  Administered 2011-04-22: 300 mg via SUBCUTANEOUS

## 2011-05-06 ENCOUNTER — Ambulatory Visit (INDEPENDENT_AMBULATORY_CARE_PROVIDER_SITE_OTHER): Payer: Medicare Other

## 2011-05-06 DIAGNOSIS — J45909 Unspecified asthma, uncomplicated: Secondary | ICD-10-CM

## 2011-05-06 MED ORDER — OMALIZUMAB 150 MG ~~LOC~~ SOLR
300.0000 mg | Freq: Once | SUBCUTANEOUS | Status: AC
  Administered 2011-05-06: 300 mg via SUBCUTANEOUS

## 2011-05-20 ENCOUNTER — Ambulatory Visit (INDEPENDENT_AMBULATORY_CARE_PROVIDER_SITE_OTHER): Payer: Medicare Other

## 2011-05-20 DIAGNOSIS — J45909 Unspecified asthma, uncomplicated: Secondary | ICD-10-CM

## 2011-05-21 DIAGNOSIS — J45909 Unspecified asthma, uncomplicated: Secondary | ICD-10-CM

## 2011-05-21 MED ORDER — OMALIZUMAB 150 MG ~~LOC~~ SOLR
300.0000 mg | Freq: Once | SUBCUTANEOUS | Status: AC
  Administered 2011-05-21: 300 mg via SUBCUTANEOUS

## 2011-06-03 ENCOUNTER — Other Ambulatory Visit: Payer: Self-pay | Admitting: Dermatology

## 2011-06-04 ENCOUNTER — Ambulatory Visit (INDEPENDENT_AMBULATORY_CARE_PROVIDER_SITE_OTHER): Payer: Medicare Other

## 2011-06-04 DIAGNOSIS — J45909 Unspecified asthma, uncomplicated: Secondary | ICD-10-CM

## 2011-06-05 MED ORDER — OMALIZUMAB 150 MG ~~LOC~~ SOLR
300.0000 mg | Freq: Once | SUBCUTANEOUS | Status: AC
  Administered 2011-06-05: 300 mg via SUBCUTANEOUS

## 2011-06-10 ENCOUNTER — Encounter: Payer: Self-pay | Admitting: Critical Care Medicine

## 2011-06-10 ENCOUNTER — Ambulatory Visit (INDEPENDENT_AMBULATORY_CARE_PROVIDER_SITE_OTHER): Payer: Medicare Other | Admitting: Critical Care Medicine

## 2011-06-10 VITALS — BP 128/76 | HR 67 | Temp 98.3°F | Ht 63.5 in | Wt 140.8 lb

## 2011-06-10 DIAGNOSIS — J45909 Unspecified asthma, uncomplicated: Secondary | ICD-10-CM

## 2011-06-10 DIAGNOSIS — J45901 Unspecified asthma with (acute) exacerbation: Secondary | ICD-10-CM

## 2011-06-10 MED ORDER — PREDNISONE 10 MG PO TABS: ORAL_TABLET | ORAL | Status: DC

## 2011-06-10 NOTE — Assessment & Plan Note (Signed)
Moderate persistent asthma on an extrinsic basis with acute flare likely hypersensitivity in nature Plan Prednisone over a day course with tapering dose Maintain inhaled medications as prescribed

## 2011-06-10 NOTE — Progress Notes (Signed)
Subjective:    Patient ID: [name], female    DOB: 09-22-1923, 77 y.o.   MRN: [id number]  HPI  This is a 77 y.o.  white female, history of asthmatic bronchitis, severe, persistent. Severe Atopy on Xolair.     8/27 Not seen since 11/11.   Pt has done well since last OV.  No new pulm issues.  Pt did have some sneezing and sore throat and coughing then with salt gargle and mucinex and this helped.  Still with a dry hacky cough.  No real chest pain.  Notes some muscle pain.  No real wheeze. Asthma History:   Symptoms  0-2 days/week         Nighttime Awakenings  0-2/month         Asthma interference with normal activity  No limitations         SABA use (not for EIB)  0-2 days/wk         Risk: Exacerbations requiring oral systemic steroids  0-1 / year          06/10/2011 Noting more cough and congestion,  Worse since sept.  Mucus is yellow, notes pndrip Asthma History: Symptoms Daily Nighttime Awakenings 0-2/month Asthma interference with normal activity Minor limitations SABA use (not for EIB) Daily Risk: Exacerbations requiring oral systemic steroids 0-1 / year   Past Medical History  Diagnosis Date  . Asthma   . Hypertension   . Arthritis   . Depression   . Hyperlipidemia      Family History  Problem Relation Age of Onset  . Asthma    . Colon cancer Father      History   Social History  . Marital Status: Married    Spouse Name: N/A    Number of Children: N/A  . Years of Education: N/A   Occupational History  . retired    Social History Main Topics  . Smoking status: Never Smoker   . Smokeless tobacco: Never Used  . Alcohol Use: Yes  . Drug Use: No  . Sexually Active: Not on file   Other Topics Concern  . Not on file   Social History Narrative  . No narrative on file     Allergies  Allergen Reactions  . Sulfonamide Derivatives      Outpatient Prescriptions Prior to Visit  Medication Sig Dispense Refill  . acetaminophen (TYLENOL) 500 MG tablet  Take 500 mg by mouth every 4 (four) hours as needed.        Marland Kitchen ADVAIR DISKUS 250-50 MCG/DOSE AEPB Take 1 puff by mouth 2 (two) times daily.  60 each  6  . albuterol (PROVENTIL HFA;VENTOLIN HFA) 108 (90 BASE) MCG/ACT inhaler Inhale 2 puffs into the lungs every 6 (six) hours as needed.        Marland Kitchen amLODipine (NORVASC) 5 MG tablet Take 5 mg by mouth daily.        . calcium-vitamin D (OSCAL WITH D) 500-200 MG-UNIT per tablet Take 1 tablet by mouth daily.        . cetirizine (ZYRTEC) 10 MG tablet Take 1 tablet (10 mg total) by mouth daily.  30 tablet  6  . FLUoxetine (PROZAC) 20 MG capsule Take 40 mg by mouth daily.       . fluticasone (FLONASE) 50 MCG/ACT nasal spray Place 2 sprays into the nose daily.  16 g  6  . hydrochlorothiazide 25 MG tablet Take 25 mg by mouth daily.        Marland Kitchen  montelukast (SINGULAIR) 10 MG tablet Take 1 tablet (10 mg total) by mouth daily.  30 tablet  6  . Multiple Vitamin (MULTIVITAMIN) tablet Take 1 tablet by mouth daily.        Marland Kitchen olmesartan (BENICAR) 40 MG tablet Take 40 mg by mouth daily.        Marland Kitchen omalizumab (XOLAIR) 150 MG injection 300 mg every 2 weeks          Review of Systems  Constitutional:   No  weight loss, night sweats,  Fevers, chills, fatigue, lassitude. HEENT:   No headaches,  Difficulty swallowing,  Tooth/dental problems,  Sore throat,                No sneezing, itching, ear ache, nasal congestion, post nasal drip,   CV:  No chest pain,  Orthopnea, PND, swelling in lower extremities, anasarca, dizziness, palpitations  GI  No heartburn, indigestion, abdominal pain, nausea, vomiting, diarrhea, change in bowel habits, loss of appetite  Resp: Notes  shortness of breath with exertion not at rest.  Notes  excess mucus, notes  productive cough,  No non-productive cough,  No coughing up of blood.  Notes change in color of mucus.  Notes  wheezing.  No chest wall deformity  Skin: no rash or lesions.  GU: no dysuria, change in color of urine, no urgency or  frequency.  No flank pain.  MS:  No joint pain or swelling.  No decreased range of motion.  No back pain.  Psych:  No change in mood or affect. No depression or anxiety.  No memory loss.     Objective:   Physical Exam   Filed Vitals:   06/10/11 0933  BP: 128/76  Pulse: 67  Temp: 98.3 F (36.8 C)  TempSrc: Oral  Height: 5' 3.5" (1.613 m)  Weight: 140 lb 12.8 oz (63.866 kg)  SpO2: 97%    Gen: Pleasant, well-nourished, in no distress,  normal affect  ENT: No lesions,  mouth clear,  oropharynx clear, no postnasal drip  Neck: No JVD, no TMG, no carotid bruits  Lungs: No use of accessory muscles, no dullness to percussion, clear without rales or rhonchi  Cardiovascular: RRR, heart sounds normal, no murmur or gallops, no peripheral edema  Abdomen: soft and NT, no HSM,  BS normal  Musculoskeletal: No deformities, no cyanosis or clubbing  Neuro: alert, non focal  Skin: Warm, no lesions or rashes       Assessment & Plan:   Extrinsic asthma, unspecified Moderate persistent asthma on an extrinsic basis with acute flare likely hypersensitivity in nature Plan Prednisone over a day course with tapering dose Maintain inhaled medications as prescribed     Updated Medication List Outpatient Encounter Prescriptions as of 06/10/2011  Medication Sig Dispense Refill  . acetaminophen (TYLENOL) 500 MG tablet Take 500 mg by mouth every 4 (four) hours as needed.        Marland Kitchen ADVAIR DISKUS 250-50 MCG/DOSE AEPB Take 1 puff by mouth 2 (two) times daily.  60 each  6  . albuterol (PROVENTIL HFA;VENTOLIN HFA) 108 (90 BASE) MCG/ACT inhaler Inhale 2 puffs into the lungs every 6 (six) hours as needed.        Marland Kitchen amLODipine (NORVASC) 5 MG tablet Take 5 mg by mouth daily.        . calcium-vitamin D (OSCAL WITH D) 500-200 MG-UNIT per tablet Take 1 tablet by mouth daily.        . cetirizine (ZYRTEC) 10 MG tablet  Take 1 tablet (10 mg total) by mouth daily.  30 tablet  6  . FLUoxetine (PROZAC) 20 MG  capsule Take 40 mg by mouth daily.       . fluticasone (FLONASE) 50 MCG/ACT nasal spray Place 2 sprays into the nose daily.  16 g  6  . hydrochlorothiazide 25 MG tablet Take 25 mg by mouth daily.        . montelukast (SINGULAIR) 10 MG tablet Take 1 tablet (10 mg total) by mouth daily.  30 tablet  6  . Multiple Vitamin (MULTIVITAMIN) tablet Take 1 tablet by mouth daily.        Marland Kitchen olmesartan (BENICAR) 40 MG tablet Take 40 mg by mouth daily.        Marland Kitchen omalizumab (XOLAIR) 150 MG injection 300 mg every 2 weeks       . predniSONE (DELTASONE) 10 MG tablet Take 4 for two days three for two days two for two days one for two days  20 tablet  0

## 2011-06-10 NOTE — Patient Instructions (Signed)
Prednisone 10mg  Take 4 for two days three for two days two for two days one for two days No other medication change Return 6 weeks

## 2011-06-18 ENCOUNTER — Ambulatory Visit (INDEPENDENT_AMBULATORY_CARE_PROVIDER_SITE_OTHER): Payer: Medicare Other

## 2011-06-18 DIAGNOSIS — J45909 Unspecified asthma, uncomplicated: Secondary | ICD-10-CM

## 2011-06-23 DIAGNOSIS — J45909 Unspecified asthma, uncomplicated: Secondary | ICD-10-CM

## 2011-06-23 MED ORDER — OMALIZUMAB 150 MG ~~LOC~~ SOLR
300.0000 mg | Freq: Once | SUBCUTANEOUS | Status: AC
  Administered 2011-06-23: 300 mg via SUBCUTANEOUS

## 2011-06-25 ENCOUNTER — Emergency Department (HOSPITAL_COMMUNITY): Payer: Medicare Other

## 2011-06-25 ENCOUNTER — Encounter (HOSPITAL_COMMUNITY)
Admission: EM | Disposition: A | Discharge status: Skilled Nursing Facility | Payer: Self-pay | Source: Home / Self Care | Attending: Orthopedic Surgery

## 2011-06-25 ENCOUNTER — Emergency Department (HOSPITAL_COMMUNITY): Payer: Medicare Other | Admitting: Anesthesiology

## 2011-06-25 ENCOUNTER — Other Ambulatory Visit: Payer: Self-pay

## 2011-06-25 ENCOUNTER — Encounter (HOSPITAL_COMMUNITY): Payer: Self-pay | Admitting: Anesthesiology

## 2011-06-25 ENCOUNTER — Inpatient Hospital Stay (HOSPITAL_COMMUNITY)
Admission: EM | Admit: 2011-06-25 | Discharge: 2011-06-29 | DRG: 470 | Disposition: A | Discharge status: Skilled Nursing Facility | Payer: Medicare Other | Attending: Orthopedic Surgery | Admitting: Orthopedic Surgery

## 2011-06-25 DIAGNOSIS — D62 Acute posthemorrhagic anemia: Secondary | ICD-10-CM | POA: Diagnosis not present

## 2011-06-25 DIAGNOSIS — Y92009 Unspecified place in unspecified non-institutional (private) residence as the place of occurrence of the external cause: Secondary | ICD-10-CM

## 2011-06-25 DIAGNOSIS — S72033A Displaced midcervical fracture of unspecified femur, initial encounter for closed fracture: Principal | ICD-10-CM | POA: Diagnosis present

## 2011-06-25 DIAGNOSIS — W010XXA Fall on same level from slipping, tripping and stumbling without subsequent striking against object, initial encounter: Secondary | ICD-10-CM | POA: Diagnosis present

## 2011-06-25 DIAGNOSIS — S72001A Fracture of unspecified part of neck of right femur, initial encounter for closed fracture: Secondary | ICD-10-CM

## 2011-06-25 DIAGNOSIS — E871 Hypo-osmolality and hyponatremia: Secondary | ICD-10-CM | POA: Diagnosis not present

## 2011-06-25 DIAGNOSIS — I10 Essential (primary) hypertension: Secondary | ICD-10-CM | POA: Diagnosis present

## 2011-06-25 DIAGNOSIS — J309 Allergic rhinitis, unspecified: Secondary | ICD-10-CM | POA: Diagnosis present

## 2011-06-25 DIAGNOSIS — R82998 Other abnormal findings in urine: Secondary | ICD-10-CM | POA: Diagnosis not present

## 2011-06-25 HISTORY — PX: HIP ARTHROPLASTY: SHX981

## 2011-06-25 LAB — COMPREHENSIVE METABOLIC PANEL
AST: 25 U/L (ref 0–37)
Albumin: 3.8 g/dL (ref 3.5–5.2)
BUN: 17 mg/dL (ref 6–23)
Creatinine, Ser: 0.71 mg/dL (ref 0.50–1.10)
Potassium: 3.3 mEq/L — ABNORMAL LOW (ref 3.5–5.1)
Total Protein: 6.6 g/dL (ref 6.0–8.3)

## 2011-06-25 LAB — DIFFERENTIAL
Basophils Absolute: 0 10*3/uL (ref 0.0–0.1)
Basophils Relative: 0 % (ref 0–1)
Eosinophils Absolute: 0.1 10*3/uL (ref 0.0–0.7)
Monocytes Absolute: 0.6 10*3/uL (ref 0.1–1.0)
Monocytes Relative: 4 % (ref 3–12)
Neutrophils Relative %: 92 % — ABNORMAL HIGH (ref 43–77)

## 2011-06-25 LAB — URINALYSIS, ROUTINE W REFLEX MICROSCOPIC
Hgb urine dipstick: NEGATIVE
Nitrite: NEGATIVE
Protein, ur: NEGATIVE mg/dL
Specific Gravity, Urine: 1.012 (ref 1.005–1.030)
Urobilinogen, UA: 0.2 mg/dL (ref 0.0–1.0)

## 2011-06-25 LAB — CBC
Hemoglobin: 11.7 g/dL — ABNORMAL LOW (ref 12.0–15.0)
MCH: 32.6 pg (ref 26.0–34.0)
MCHC: 35.5 g/dL (ref 30.0–36.0)
RDW: 13.6 % (ref 11.5–15.5)

## 2011-06-25 LAB — HEMOGLOBIN AND HEMATOCRIT, BLOOD
HCT: 30.4 % — ABNORMAL LOW (ref 36.0–46.0)
Hemoglobin: 10.5 g/dL — ABNORMAL LOW (ref 12.0–15.0)

## 2011-06-25 LAB — URINE MICROSCOPIC-ADD ON

## 2011-06-25 LAB — ABO/RH: ABO/RH(D): O NEG

## 2011-06-25 SURGERY — HEMIARTHROPLASTY, HIP, DIRECT ANTERIOR APPROACH, FOR FRACTURE
Anesthesia: General | Site: Hip | Laterality: Right | Wound class: Clean

## 2011-06-25 MED ORDER — DIPHENHYDRAMINE HCL 12.5 MG/5ML PO ELIX: 12.5000 mg | ORAL_SOLUTION | ORAL | Status: DC | PRN

## 2011-06-25 MED ORDER — FLEET ENEMA 7-19 GM/118ML RE ENEM: 1.0000 | ENEMA | Freq: Every day | RECTAL | Status: DC | PRN

## 2011-06-25 MED ORDER — HYDROCODONE-ACETAMINOPHEN 5-325 MG PO TABS
1.0000 | ORAL_TABLET | ORAL | Status: DC | PRN
  Administered 2011-06-26 – 2011-06-27 (×4): 1 via ORAL
  Administered 2011-06-28 (×2): 2 via ORAL
  Filled 2011-06-25 (×4): qty 1
  Filled 2011-06-25 (×3): qty 2
  Filled 2011-06-25: qty 1

## 2011-06-25 MED ORDER — SODIUM CHLORIDE 0.9 % IV SOLN
INTRAVENOUS | Status: DC | PRN
  Administered 2011-06-25: 21:00:00 via INTRAVENOUS

## 2011-06-25 MED ORDER — BISACODYL 5 MG PO TBEC: 10.0000 mg | DELAYED_RELEASE_TABLET | Freq: Every day | ORAL | Status: DC | PRN

## 2011-06-25 MED ORDER — LACTATED RINGERS IV SOLN: INTRAVENOUS | Status: DC

## 2011-06-25 MED ORDER — HYDROMORPHONE HCL PF 1 MG/ML IJ SOLN: 0.2500 mg | INTRAMUSCULAR | Status: DC | PRN

## 2011-06-25 MED ORDER — HYDROCODONE-ACETAMINOPHEN 5-325 MG PO TABS
1.0000 | ORAL_TABLET | ORAL | Status: DC | PRN
  Administered 2011-06-26 – 2011-06-27 (×3): 1 via ORAL
  Administered 2011-06-27: 2 via ORAL
  Administered 2011-06-27: 1 via ORAL
  Administered 2011-06-27: 2 via ORAL
  Administered 2011-06-28 – 2011-06-29 (×2): 1 via ORAL
  Filled 2011-06-25 (×3): qty 1
  Filled 2011-06-25 (×3): qty 2

## 2011-06-25 MED ORDER — DEXAMETHASONE SODIUM PHOSPHATE 4 MG/ML IJ SOLN
8.0000 mg | Freq: Once | INTRAMUSCULAR | Status: AC | PRN
  Filled 2011-06-25: qty 2

## 2011-06-25 MED ORDER — HYDROMORPHONE HCL PF 1 MG/ML IJ SOLN: 0.5000 mg | INTRAMUSCULAR | Status: DC | PRN

## 2011-06-25 MED ORDER — METHOCARBAMOL 500 MG PO TABS
500.0000 mg | ORAL_TABLET | Freq: Four times a day (QID) | ORAL | Status: DC | PRN
  Administered 2011-06-26 – 2011-06-29 (×8): 500 mg via ORAL
  Filled 2011-06-25 (×8): qty 1

## 2011-06-25 MED ORDER — HYDROMORPHONE HCL PF 1 MG/ML IJ SOLN
1.0000 mg | Freq: Once | INTRAMUSCULAR | Status: AC
  Administered 2011-06-25: 1 mg via INTRAVENOUS
  Filled 2011-06-25: qty 1

## 2011-06-25 MED ORDER — SODIUM CHLORIDE 0.9 % IV SOLN
Freq: Once | INTRAVENOUS | Status: AC
  Administered 2011-06-25: 17:00:00 via INTRAVENOUS

## 2011-06-25 MED ORDER — PROPOFOL 10 MG/ML IV EMUL
INTRAVENOUS | Status: DC | PRN
  Administered 2011-06-25: 20 mg via INTRAVENOUS
  Administered 2011-06-25: 100 mg via INTRAVENOUS

## 2011-06-25 MED ORDER — MENTHOL 3 MG MT LOZG: 1.0000 | LOZENGE | OROMUCOSAL | Status: DC | PRN

## 2011-06-25 MED ORDER — HETASTARCH-ELECTROLYTES 6 % IV SOLN
INTRAVENOUS | Status: DC | PRN
  Administered 2011-06-25: 20:00:00 via INTRAVENOUS

## 2011-06-25 MED ORDER — PHENOL 1.4 % MT LIQD: 1.0000 | OROMUCOSAL | Status: DC | PRN

## 2011-06-25 MED ORDER — ACETAMINOPHEN 325 MG PO TABS: 650.0000 mg | ORAL_TABLET | Freq: Four times a day (QID) | ORAL | Status: DC | PRN

## 2011-06-25 MED ORDER — POLYETHYLENE GLYCOL 3350 17 G PO PACK
17.0000 g | PACK | Freq: Every day | ORAL | Status: DC | PRN
  Filled 2011-06-25: qty 1

## 2011-06-25 MED ORDER — DOCUSATE SODIUM 100 MG PO CAPS
100.0000 mg | ORAL_CAPSULE | Freq: Two times a day (BID) | ORAL | Status: DC
  Administered 2011-06-26 – 2011-06-28 (×7): 100 mg via ORAL
  Filled 2011-06-25 (×9): qty 1

## 2011-06-25 MED ORDER — CEFAZOLIN SODIUM 1-5 GM-% IV SOLN
1.0000 g | INTRAVENOUS | Status: AC
  Administered 2011-06-25: 1 g via INTRAVENOUS
  Filled 2011-06-25: qty 50

## 2011-06-25 MED ORDER — METHOCARBAMOL 100 MG/ML IJ SOLN
500.0000 mg | Freq: Four times a day (QID) | INTRAVENOUS | Status: DC | PRN
  Administered 2011-06-26: 500 mg via INTRAVENOUS
  Filled 2011-06-25: qty 5

## 2011-06-25 MED ORDER — LACTATED RINGERS IV SOLN
INTRAVENOUS | Status: DC | PRN
  Administered 2011-06-25: 21:00:00 via INTRAVENOUS

## 2011-06-25 MED ORDER — METOCLOPRAMIDE HCL 10 MG PO TABS: 5.0000 mg | ORAL_TABLET | Freq: Three times a day (TID) | ORAL | Status: DC | PRN

## 2011-06-25 MED ORDER — ONDANSETRON HCL 4 MG PO TABS: 4.0000 mg | ORAL_TABLET | Freq: Four times a day (QID) | ORAL | Status: DC | PRN

## 2011-06-25 MED ORDER — LIDOCAINE HCL (CARDIAC) 20 MG/ML IV SOLN
INTRAVENOUS | Status: DC | PRN
  Administered 2011-06-25: 50 mg via INTRAVENOUS

## 2011-06-25 MED ORDER — HYDROMORPHONE HCL PF 1 MG/ML IJ SOLN
0.5000 mg | Freq: Once | INTRAMUSCULAR | Status: AC
  Administered 2011-06-25: 0.5 mg via INTRAVENOUS
  Filled 2011-06-25 (×2): qty 1

## 2011-06-25 MED ORDER — ONDANSETRON HCL 4 MG/2ML IJ SOLN
4.0000 mg | Freq: Once | INTRAMUSCULAR | Status: AC
  Administered 2011-06-25: 4 mg via INTRAVENOUS
  Filled 2011-06-25: qty 2

## 2011-06-25 MED ORDER — FENTANYL CITRATE 0.05 MG/ML IJ SOLN
INTRAMUSCULAR | Status: DC | PRN
  Administered 2011-06-25: 50 ug via INTRAVENOUS
  Administered 2011-06-25: 100 ug via INTRAVENOUS
  Administered 2011-06-25: 50 ug via INTRAVENOUS

## 2011-06-25 MED ORDER — METOCLOPRAMIDE HCL 5 MG/ML IJ SOLN: 5.0000 mg | Freq: Three times a day (TID) | INTRAMUSCULAR | Status: DC | PRN

## 2011-06-25 MED ORDER — BISACODYL 10 MG RE SUPP: 10.0000 mg | Freq: Every day | RECTAL | Status: DC | PRN

## 2011-06-25 MED ORDER — RIVAROXABAN 10 MG PO TABS
10.0000 mg | ORAL_TABLET | ORAL | Status: DC
  Administered 2011-06-26 – 2011-06-28 (×3): 10 mg via ORAL
  Filled 2011-06-25 (×4): qty 1

## 2011-06-25 MED ORDER — EPHEDRINE SULFATE 50 MG/ML IJ SOLN
INTRAMUSCULAR | Status: DC | PRN
  Administered 2011-06-25: 10 mg via INTRAVENOUS

## 2011-06-25 MED ORDER — PHENYLEPHRINE HCL 10 MG/ML IJ SOLN
INTRAMUSCULAR | Status: DC | PRN
  Administered 2011-06-25 (×3): 80 ug via INTRAVENOUS

## 2011-06-25 MED ORDER — ACETAMINOPHEN 650 MG RE SUPP: 650.0000 mg | Freq: Four times a day (QID) | RECTAL | Status: DC | PRN

## 2011-06-25 MED ORDER — OXYCODONE-ACETAMINOPHEN 5-325 MG PO TABS: 1.0000 | ORAL_TABLET | ORAL | Status: DC | PRN

## 2011-06-25 MED ORDER — ONDANSETRON HCL 4 MG/2ML IJ SOLN: 4.0000 mg | Freq: Four times a day (QID) | INTRAMUSCULAR | Status: DC | PRN

## 2011-06-25 MED ORDER — MIDAZOLAM HCL 5 MG/5ML IJ SOLN
INTRAMUSCULAR | Status: DC | PRN
  Administered 2011-06-25: 0.5 mg via INTRAVENOUS

## 2011-06-25 MED ORDER — SUCCINYLCHOLINE CHLORIDE 20 MG/ML IJ SOLN
INTRAMUSCULAR | Status: DC | PRN
  Administered 2011-06-25: 100 mg via INTRAVENOUS

## 2011-06-25 MED ORDER — PHENYLEPHRINE HCL 10 MG/ML IJ SOLN
10.0000 mg | INTRAVENOUS | Status: DC | PRN
  Administered 2011-06-25: 50 ug/min via INTRAVENOUS

## 2011-06-25 MED ORDER — MEPERIDINE HCL 50 MG/ML IJ SOLN: 6.2500 mg | INTRAMUSCULAR | Status: DC | PRN

## 2011-06-25 MED ORDER — CEFAZOLIN SODIUM 1-5 GM-% IV SOLN
1.0000 g | Freq: Four times a day (QID) | INTRAVENOUS | Status: AC
  Administered 2011-06-26 (×3): 1 g via INTRAVENOUS
  Filled 2011-06-25 (×3): qty 50

## 2011-06-25 MED ORDER — MAGNESIUM HYDROXIDE 400 MG/5ML PO SUSP: 30.0000 mL | Freq: Two times a day (BID) | ORAL | Status: DC | PRN

## 2011-06-25 SURGICAL SUPPLY — 49 items
BAG SPEC THK2 15X12 ZIP CLS (MISCELLANEOUS) ×1
BAG ZIPLOCK 12X15 (MISCELLANEOUS) ×2 IMPLANT
BLADE SAW SAG 73X25 THK (BLADE) ×1
BLADE SAW SGTL 73X25 THK (BLADE) ×1 IMPLANT
BNDG COHESIVE 4X5 TAN STRL (GAUZE/BANDAGES/DRESSINGS) ×1 IMPLANT
BRUSH FEMORAL CANAL (MISCELLANEOUS) ×2 IMPLANT
CLOTH BEACON ORANGE TIMEOUT ST (SAFETY) ×2 IMPLANT
DRAPE INCISE IOBAN 66X45 STRL (DRAPES) ×2 IMPLANT
DRAPE ORTHO SPLIT 77X108 STRL (DRAPES) ×2
DRAPE POUCH INSTRU U-SHP 10X18 (DRAPES) ×2 IMPLANT
DRAPE SURG ORHT 6 SPLT 77X108 (DRAPES) ×1 IMPLANT
DRSG EMULSION OIL 3X16 NADH (GAUZE/BANDAGES/DRESSINGS) ×2 IMPLANT
DRSG PAD ABDOMINAL 8X10 ST (GAUZE/BANDAGES/DRESSINGS) ×2 IMPLANT
ELECT BLADE TIP CTD 4 INCH (ELECTRODE) ×1 IMPLANT
ELECT REM PT RETURN 9FT ADLT (ELECTROSURGICAL) ×2
ELECTRODE REM PT RTRN 9FT ADLT (ELECTROSURGICAL) ×1 IMPLANT
EVACUATOR 1/8 PVC DRAIN (DRAIN) ×1 IMPLANT
FACESHIELD LNG OPTICON STERILE (SAFETY) ×7 IMPLANT
GLOVE BIO SURGEON STRL SZ7.5 (GLOVE) ×2 IMPLANT
GLOVE BIO SURGEON STRL SZ8 (GLOVE) ×4 IMPLANT
GLOVE BIOGEL PI IND STRL 8 (GLOVE) ×1 IMPLANT
GLOVE BIOGEL PI INDICATOR 8 (GLOVE) ×1
GLOVE ECLIPSE 8.0 STRL XLNG CF (GLOVE) ×2 IMPLANT
GOWN STRL NON-REIN LRG LVL3 (GOWN DISPOSABLE) ×2 IMPLANT
GOWN STRL REIN XL XLG (GOWN DISPOSABLE) ×2 IMPLANT
HANDPIECE INTERPULSE COAX TIP (DISPOSABLE) ×2
IMMBOLIZER KNEE 19 BLUE UNIV (SOFTGOODS) ×1 IMPLANT
IMMOBILIZER KNEE 20 (SOFTGOODS) ×4
IMMOBILIZER KNEE 20 THIGH 36 (SOFTGOODS) IMPLANT
MANIFOLD NEPTUNE II (INSTRUMENTS) ×2 IMPLANT
PACK TOTAL JOINT (CUSTOM PROCEDURE TRAY) ×2 IMPLANT
PASSER SUT SWANSON 36MM LOOP (INSTRUMENTS) ×2 IMPLANT
PILLOW ABDUCTION HIP (SOFTGOODS) ×1 IMPLANT
POSITIONER SURGICAL ARM (MISCELLANEOUS) ×2 IMPLANT
SET HNDPC FAN SPRY TIP SCT (DISPOSABLE) ×1 IMPLANT
SPONGE GAUZE 4X4 12PLY (GAUZE/BANDAGES/DRESSINGS) ×3 IMPLANT
SPONGE LAP 18X18 X RAY DECT (DISPOSABLE) ×2 IMPLANT
STAPLER VISISTAT 35W (STAPLE) ×2 IMPLANT
SUT ETHIBOND NAB CT1 #1 30IN (SUTURE) ×6 IMPLANT
SUT VIC AB 1 CT1 27 (SUTURE) ×8
SUT VIC AB 1 CT1 27XBRD ANTBC (SUTURE) IMPLANT
SUT VIC AB 1 CTX 36 (SUTURE) ×8
SUT VIC AB 1 CTX36XBRD ANBCTR (SUTURE) ×4 IMPLANT
SUT VIC AB 2-0 CT1 27 (SUTURE) ×6
SUT VIC AB 2-0 CT1 27XBRD (SUTURE) ×2 IMPLANT
TAPE CLOTH SURG 4X10 WHT LF (GAUZE/BANDAGES/DRESSINGS) ×1 IMPLANT
TOWEL OR 17X26 10 PK STRL BLUE (TOWEL DISPOSABLE) ×4 IMPLANT
TOWER CARTRIDGE SMART MIX (DISPOSABLE) ×1 IMPLANT
TRAY FOLEY CATH 14FRSI W/METER (CATHETERS) ×1 IMPLANT

## 2011-06-25 NOTE — H&P (Cosign Needed)
[name], [name] NO.:  [id number]  MEDICAL RECORD NO.:  [id number]  LOCATION:  WA19                         FACILITY:  [facility]  PHYSICIAN:  Tarri Glenn, M.D.  DATE OF BIRTH:  1923/09/06  DATE OF ADMISSION:  06/25/2011 DATE OF DISCHARGE:                             HISTORY & PHYSICAL   CHIEF COMPLAINT:  Pain in the right hip.  PRESENT ILLNESS:  This 77 year old white female, resident of [facility] was at the facility when she caught her foot on something.  She fell to the right having immediate pain into the hip area on the right. She also struck her elbow and her hand.  She was brought to the emergency room for evaluation.  Fortunately, she suffered no other injuries during her fall.  X-rays of the right hip in the emergency room showed a subcapital right femoral fracture.  X-rays of her hand on the right showed no acute finding.  The wrist also was x-rayed, showed no acute bony or joint abnormality.  This is a very active lady participating in many social activities at the [facility].  She is in relatively good health and has decided that she would benefit with surgical intervention, so after much discussion including the risks and benefits of surgery, it was decided to go ahead with a bipolar hemiarthroplasty press-fitted to the right hip.  Dr. Shellia Carwin discussed it with the patient and drew pictures about the plan of the surgery.  PAST MEDICAL HISTORY:  This patient as said is the patient of Dr. Osborne Casco.  CURRENT MEDICATIONS: 1. Fluoxetine 40 mg daily. 2. Benicar 40 mg daily. 3. Amlodipine 5 mg daily. 4. HCTZ 25 mg daily. 5. Singulair 10 mg daily. 6. Advair 250/50 b.i.d. 7. Nasacort AQ two puffs daily. 8. She also takes Dance movement psychotherapist as a vitamin, Tylenol for discomfort,     and albuterol inhaler as needed.  ALLERGIES:  She has no medical allergies other than SULFA and ASPIRIN as well as E-MYCIN which causes GI upset.  PAST SURGERIES:   Include bladder surgery in 1959 with no sequelae.  She does have asthma.  REVIEW OF SYSTEMS:  CNS:  No seizures or paralysis, numbness, double vision.  RESPIRATORY:  No productive cough.  No hemoptysis.  Occasional shortness of breath, which is controlled very well with her medications secondary to her asthma.  CARDIOVASCULAR:  No chest pain.  No angina. No orthopnea.  GASTROINTESTINAL:  No nausea, vomiting, melena, or bloody stool.  GENITOURINARY:  No discharge, dysuria, or hematuria. MUSCULOSKELETAL:  Primarily in present illness of the right hip.  PHYSICAL EXAMINATION:  GENERAL:  Alert, cooperative, friendly, fully alert and fully oriented 77 year old female seen lying on the emergency room stretcher accompanied by her friend. VITAL SIGNS:  Blood pressure 139/55, pulse 86, respirations 16, temperature 98.2.  HEENT:  Normocephalic, PERRLA, EOM intact. Oropharynx is clear. CHEST:  Clear to auscultation, no rhonchi, no rales with the patient supine. ABDOMEN:  Soft, nontender, bowel sounds present. PELVIC/GENITALIA/BREASTS:  Not done, not pertinent to present illness. EXTREMITIES:  Right lower extremity seen slightly shortened, good pulse, and sensation is intact.  She can move her ankle.  She also has an  abrasion on her right elbow covered with Tegaderm as well as on her right lower extremity.  ADMITTING DIAGNOSES: 1. Displaced femoral neck fracture of the right hip. 2. Asthma. 3. Hypertension.  PLAN:  The patient will be admitted for hemiarthroplasty of the right hip with bipolar and press-fit component.  If severity of any medical problems, we will certainly ask Dr. Osborne Casco to follow along with [place] during the patient's hospitalization.     [name] L. [name].   ______________________________ Tarri Glenn, M.D.    DLU/MEDQ  D:  06/25/2011  T:  06/25/2011  Job:  [id number]  cc:   [name], M.D. Fax: [phone]

## 2011-06-25 NOTE — Transfer of Care (Signed)
Immediate Anesthesia Transfer of Care Note  Patient: [name] [id number]  Procedure(s) Performed:  ARTHROPLASTY BIPOLAR HIP  Patient Location: PACU  Anesthesia Type: General  Level of Consciousness: awake, alert  and responds to stimulation  Airway & Oxygen Therapy: Patient Spontanous Breathing and Patient connected to face mask oxygen  Post-op Assessment: Report given to PACU RN and Post -op Vital signs reviewed and stable  Post vital signs: stable  Complications: No apparent anesthesia complications

## 2011-06-25 NOTE — H&P (Signed)
H&P dictated, 06/25/2011, [id number].

## 2011-06-25 NOTE — Op Note (Signed)
NAMEJERMANY, WAN NO.:  [id number]  MEDICAL RECORD NO.:  [id number]  LOCATION:  WOTF                         FACILITY:  [facility]  PHYSICIAN:  Tarri Glenn, M.D.  DATE OF BIRTH:  07-28-1924  DATE OF PROCEDURE:  06/25/2011 DATE OF DISCHARGE:                              OPERATIVE REPORT   PREOPERATIVE DIAGNOSIS:  Closed displaced subcapital fracture, right hip.  POSTOPERATIVE DIAGNOSIS:  Closed displaced subcapital fracture, right hip.  OPERATION:  Noncemented bipolar arthroplasty, right hip.  SURGEON:  Tarri Glenn, M.D.  ASSISTANTElodia Florence. Delorse Lek, P.A.C.  ANESTHESIA:  General.  PATHOLOGY INDICATION FOR PROCEDURE:  As stated in diagnosis, the fracture was displaced.  Mr. Benita Gutter assistance was needed because of the complexity of the case for retraction hole in the leg and assisting with instrumentation.  PROCEDURE:  Prophylactic antibiotics, satisfactory general anesthesia, left lateral decubitus position with the right hip prepped with DuraPrep, draped in sterile field.  Time-out performed.  Posterior lateral incision down through the fascia lata.  External rotators were detached from the femur.  Retraction was somewhat of a problem because of a good bit of bleeding which we kept under control.  Finally, I was able to expose the femoral neck and cleared the piriformis fossa of soft tissue and used a cookie cutter to make a box wedge of the greater trochanter and followed this by with a canal finder and then the vertical reaming instruments going up to a size of 7.  Along the way, I made my definitive cut on the femoral neck just slightly above the lesser trochanter.  After completing the reaming process, I used the rasp going up to #7.  We then removed the femoral head from the acetabulum and measured it to about 44-45 mm in diameter.  After clearing the acetabulum of all debris, we went through a trial reduction and found that even  with a +0 neck the hip was little tight which necessitated my taking off 2 more mm up the femoral neck and re-reaming it to fix up once again the 7 rasp.  We then went through a trial reduction and found that the hip was nice and snug, and that the 45 mm ball seemed to be the best fit.  Accordingly, after irrigating both the femur and the acetabulum, we went ahead and used the final 127 degree secure fit, noncemented femoral component, impacting it gently.  There was no evidence for any iatrogenic femur fracture as part of this.  I then placed the final bipolar head on the femur followed by the bipolar cap, and we reduced the hip and it was found it was nice and stable.  I put several #1 Vicryl sutures in the remnant of the hip capsule.  I then repaired the external rotators with the same and fascia lata with the same.  Subcutaneous tissue was closed with 2-0 Vicryl, staples in the skin. Betadine, Adaptic, dry sterile dressing were applied.  She was then gently placed on her back in a knee immobilizer.  Leg lengths appeared to be equal.  ESTIMATED BLOOD LOSS:  450 cc.  Blood will be given if postop hemoglobin in the PACU warrants.  ______________________________ Tarri Glenn, M.D.     JA/MEDQ  D:  06/25/2011  T:  06/25/2011  Job:  [id number]

## 2011-06-25 NOTE — Brief Op Note (Signed)
06/25/2011  10:19 PM  PATIENT:  [name]  77 y.o. female ,subcapitaltal  POST-OPERATIVE DIAGNOSIS:  Right hip fracture  PROCEDURE:  Procedure(s): ARTHROPLASTY BIPOLAR HIP  SURGEON:  Surgeon(s): James P Aplington  PHYSICIAN ASSISTANT:   ASSISTANTS:  Mr.Underwood PAC ANESTHESIA:   general  EBL:  Total I/O In: [id number] [I.V.:900; Blood:350; IV [id number] Out: 1500 [Urine:900; Blood:600]  BLOOD ADMINISTERED:500 CC PRBC  DRAINS: none   LOCAL MEDICATIONS USED:  NONE  SPECIMEN:  No Specimen  DISPOSITION OF SPECIMEN:  N/A  COUNTS:  YES  TOURNIQUET:  * No tourniquets in log *  DICTATION: .Other Dictation: Dictation Number [id number]  PLAN OF CARE: Admit to inpatient   PATIENT DISPOSITION:  PACU - hemodynamically stable.   Delay start of Pharmacological VTE agent (>24hrs) due to surgical blood loss or risk of bleeding:  {YES/NO/NOT APPLICABLE:20182

## 2011-06-25 NOTE — ED Notes (Signed)
Pt was never seen by this Probation officer, she was in [place] prior to my assignment, per [place] RN

## 2011-06-25 NOTE — Anesthesia Preprocedure Evaluation (Addendum)
Anesthesia Evaluation  Patient identified by MRN, date of birth, ID band Patient awake    Reviewed: Allergy & Precautions, H&P , NPO status , Patient's Chart, lab work & pertinent test results  Airway Mallampati: II TM Distance: >3 FB Neck ROM: Full    Dental No notable dental hx.    Pulmonary neg pulmonary ROS, asthma ,  clear to auscultation  Pulmonary exam normal       Cardiovascular hypertension, Pt. on medications neg cardio ROS Regular Normal    Neuro/Psych PSYCHIATRIC DISORDERS Depression Negative Neurological ROS  Negative Psych ROS   GI/Hepatic negative GI ROS, Neg liver ROS,   Endo/Other  Negative Endocrine ROS  Renal/GU negative Renal ROS  Genitourinary negative   Musculoskeletal negative musculoskeletal ROS (+)   Abdominal   Peds negative pediatric ROS (+)  Hematology negative hematology ROS (+)   Anesthesia Other Findings   Reproductive/Obstetrics negative OB ROS                          Anesthesia Physical Anesthesia Plan  ASA: II  Anesthesia Plan: General   Post-op Pain Management:    Induction: Intravenous  Airway Management Planned:   Additional Equipment:   Intra-op Plan:   Post-operative Plan: Extubation in OR  Informed Consent: I have reviewed the patients History and Physical, chart, labs and discussed the procedure including the risks, benefits and alternatives for the proposed anesthesia with the patient or authorized representative who has indicated his/her understanding and acceptance.   Dental advisory given  Plan Discussed with: CRNA  Anesthesia Plan Comments:         Anesthesia Quick Evaluation

## 2011-06-25 NOTE — ED Provider Notes (Signed)
Medical screening examination/treatment/procedure(s) were conducted as a shared visit with non-physician practitioner(s) and myself.  I personally evaluated the patient during the encounter  Patient seen and examined. Right hip with rotation and shortening x-rays show fracture. Patient admitted by orthopedics  Leota Jacobsen, MD 06/25/11 [phone]

## 2011-06-25 NOTE — ED Notes (Signed)
[id number] Expected date:06/25/11<BR> Expected time: 1:21 PM<BR> Means of arrival:Ambulance<BR> Comments:<BR> GC M60. 77 YO F. FALL GLF AT HOME. R HIP PAIN, POSS FX/DISLOC. PELVIS STABLE. NO OTHER COMPLAITNS. 10 MIN ETA

## 2011-06-25 NOTE — Anesthesia Postprocedure Evaluation (Signed)
  Anesthesia Post-op Note  Patient: Nyarah Bedford [id number]  Procedure(s) Performed:  ARTHROPLASTY BIPOLAR HIP  Patient Location: PACU  Anesthesia Type: General  Level of Consciousness: awake and alert   Airway and Oxygen Therapy: Patient Spontanous Breathing  Post-op Pain: mild  Post-op Assessment: Post-op Vital signs reviewed, Patient's Cardiovascular Status Stable, Respiratory Function Stable, Patent Airway and No signs of Nausea or vomiting  Post-op Vital Signs: stable  Complications: No apparent anesthesia complications

## 2011-06-25 NOTE — ED Provider Notes (Signed)
History     CSN: [id number] Arrival date & time: 06/25/2011  1:57 PM   First MD Initiated Contact with Patient 06/25/11 1401      Chief Complaint  Patient presents with  . Fall    Hip Pain 8/10 at rest    (Consider location/radiation/quality/duration/timing/severity/associated sxs/prior treatment) HPI Comments: Patient states that she was walking past the counter in the kitchen when she tripped over something on the floor - states that she fell onto her right hip - here with right hip pain, also with bruising to right wrist as well - non-ambulatory since the fall.  Denies neck pain, headache, loss of consciousness, dizziness  Patient is a 77 y.o. female presenting with fall. The history is provided by the patient. No language interpreter was used.  Fall The accident occurred 1 to 2 hours ago. The fall occurred while walking. She fell from a height of 3 to 5 ft. She landed on a hard floor. There was no blood loss. The point of impact was the right hip and right wrist. The pain is present in the right hip and right wrist. The pain is at a severity of 7/10. The pain is severe. She was not ambulatory at the scene. There was no entrapment after the fall. There was no drug use involved in the accident. There was no alcohol use involved in the accident. Pertinent negatives include no visual change, no fever, no numbness, no abdominal pain, no nausea, no vomiting, no headaches and no loss of consciousness. The symptoms are aggravated by activity. She has tried nothing for the symptoms.    Past Medical History  Diagnosis Date  . Asthma   . Hypertension   . Arthritis   . Depression   . Hyperlipidemia     Past Surgical History  Procedure Date  . Vaginal hysterectomy     Family History  Problem Relation Age of Onset  . Asthma    . Colon cancer Father     History  Substance Use Topics  . Smoking status: Never Smoker   . Smokeless tobacco: Never Used  . Alcohol Use: Yes    OB  History    Grav Para Term Preterm Abortions TAB SAB Ect Mult Living                  Review of Systems  Constitutional: Negative for fever and chills.  HENT: Negative for ear pain, congestion and neck pain.   Eyes: Negative for pain and visual disturbance.  Respiratory: Negative for chest tightness and shortness of breath.   Cardiovascular: Negative for chest pain.  Gastrointestinal: Negative for nausea, vomiting and abdominal pain.  Genitourinary: Negative for pelvic pain.  Musculoskeletal: Negative for back pain.  Skin: Negative for wound.  Neurological: Negative for loss of consciousness, numbness and headaches.  Hematological: Does not bruise/bleed easily.  Psychiatric/Behavioral: Negative for confusion.    Allergies  Aspirin and Sulfonamide derivatives  Home Medications   Current Outpatient Rx  Name Route Sig Dispense Refill  . ACETAMINOPHEN 500 MG PO TABS Oral Take 500 mg by mouth every 4 (four) hours as needed.      Marland Kitchen ADVAIR DISKUS 250-50 MCG/DOSE IN AEPB Oral Take 1 puff by mouth 2 (two) times daily. 60 each 6    Dispense as written.  . ALBUTEROL SULFATE HFA 108 (90 BASE) MCG/ACT IN AERS Inhalation Inhale 2 puffs into the lungs every 6 (six) hours as needed.      Marland Kitchen AMLODIPINE BESYLATE 5  MG PO TABS Oral Take 5 mg by mouth daily.      Marland Kitchen CALCIUM CARBONATE-VITAMIN D 500-200 MG-UNIT PO TABS Oral Take 1 tablet by mouth daily.      Marland Kitchen CETIRIZINE HCL 10 MG PO TABS Oral Take 1 tablet (10 mg total) by mouth daily. 30 tablet 6  . FLUOXETINE HCL 20 MG PO CAPS Oral Take 40 mg by mouth daily.     Marland Kitchen FLUTICASONE PROPIONATE 50 MCG/ACT NA SUSP Nasal Place 2 sprays into the nose daily. 16 g 6  . HYDROCHLOROTHIAZIDE 25 MG PO TABS Oral Take 25 mg by mouth daily.      Marland Kitchen MONTELUKAST SODIUM 10 MG PO TABS Oral Take 1 tablet (10 mg total) by mouth daily. 30 tablet 6    Generic please  . ONE-DAILY MULTI VITAMINS PO TABS Oral Take 1 tablet by mouth daily.      Marland Kitchen OLMESARTAN MEDOXOMIL 40 MG PO TABS  Oral Take 40 mg by mouth daily.      . OMALIZUMAB 150 MG [place] SOLR  300 mg every 2 weeks     . PREDNISONE 10 MG PO TABS  Take 4 for two days three for two days two for two days one for two days 20 tablet 0    BP 168/61  Pulse 86  Temp(Src) 98.2 F (36.8 C) (Oral)  Resp 16  SpO2 99%  Physical Exam  Nursing note and vitals reviewed. Constitutional: She appears well-developed and well-nourished.  HENT:  Head: Normocephalic and atraumatic.  Right Ear: External ear normal.  Left Ear: External ear normal.  Nose: Nose normal.  Mouth/Throat: Oropharynx is clear and moist.  Eyes: Conjunctivae are normal. Pupils are equal, round, and reactive to light.  Neck: Normal range of motion. Neck supple. No spinous process tenderness and no muscular tenderness present.  Cardiovascular: Normal rate, regular rhythm, normal heart sounds and intact distal pulses.   Pulmonary/Chest: Effort normal and breath sounds normal. No respiratory distress. She has no wheezes. She exhibits no tenderness.  Abdominal: Soft. Bowel sounds are normal. She exhibits no distension. There is no tenderness.  Musculoskeletal:       Right wrist: She exhibits no tenderness and no deformity.       Right hip: She exhibits decreased range of motion, tenderness and deformity.       Arms:      Legs:   ED Course  Procedures (including critical care time)   Labs Reviewed  URINE CULTURE  URINALYSIS, ROUTINE W REFLEX MICROSCOPIC   No results found. Results for orders placed during the hospital encounter of 06/25/11  URINALYSIS, ROUTINE W REFLEX MICROSCOPIC      Component Value Range   Color, Urine YELLOW  YELLOW    Appearance CLEAR  CLEAR    Specific Gravity, Urine 1.012  1.005 - 1.030    pH 7.0  5.0 - 8.0    Glucose, UA NEGATIVE  NEGATIVE (mg/dL)   Hgb urine dipstick NEGATIVE  NEGATIVE    Bilirubin Urine NEGATIVE  NEGATIVE    Ketones, ur NEGATIVE  NEGATIVE (mg/dL)   Protein, ur NEGATIVE  NEGATIVE (mg/dL)   Urobilinogen,  UA 0.2  0.0 - 1.0 (mg/dL)   Nitrite NEGATIVE  NEGATIVE    Leukocytes, UA SMALL (*) NEGATIVE   URINE MICROSCOPIC-ADD ON      Component Value Range   WBC, UA 0-2  <3 (WBC/hpf)   RBC / HPF 0-2  <3 (RBC/hpf)   Dg Chest 1 View  06/25/2011  *  RADIOLOGY REPORT*  Clinical Data: Right hip fracture, fall  CHEST - 1 VIEW  Comparison: 01/16/2011  Findings: Borderline enlargement of cardiac silhouette. Mediastinal contours and pulmonary vascularity normal. Bronchitic changes without infiltrate or effusion. No pneumothorax or fracture identified. Bones appear demineralized.  IMPRESSION: Bronchitic changes. Borderline enlargement of cardiac silhouette.  Original Report Authenticated By: Burnetta Sabin, M.D.   Dg Wrist Complete Right  06/25/2011  *RADIOLOGY REPORT*  Clinical Data: Fall, pain.  RIGHT WRIST - COMPLETE 3+ VIEW  Comparison: None.  Findings: No acute bony or joint abnormality is identified.  The patient has advanced first [place] osteoarthritis.  Soft tissues unremarkable.  IMPRESSION: No acute finding.  Original Report Authenticated By: Arvid Right. D'ALESSIO, M.D.   Dg Hip Complete Right  06/25/2011  *RADIOLOGY REPORT*  Clinical Data: Pain.  Right hip deformity.  RIGHT HIP - COMPLETE 2+ VIEW  Comparison: None.  Findings: The patient has a subcapital right femur fracture.  No other fracture is identified.  No dislocation.  IMPRESSION: Subcapital right femur fracture. Per CMS PQRS reporting requirements (PQRS Measure 24): Given the patient's age of greater than 64 and the fracture site (hip, distal radius, or spine), the patient should be tested for osteoporosis using DXA, and the appropriate treatment considered based on the DXA results.  Original Report Authenticated By: Arvid Right. Luther Parody, M.D.   Dg Hand Complete Right  06/25/2011  *RADIOLOGY REPORT*  Clinical Data: Fall, pain.  RIGHT HAND - COMPLETE 3+ VIEW  Comparison: None.  Findings: No acute bony or joint abnormality is identified.  The patient has  advanced first [place] osteoarthritis and marked degenerative change of the DIP joints.  Volar subluxation of the second and third MCP joints with hooking osteophytes also noted. No dislocation.  IMPRESSION: No acute finding.  Multi focal severe degenerative change.  Original Report Authenticated By: Arvid Right. Luther Parody, M.D.     Right subcapital hip fracture    MDM  Patient with right subcapital hip fracture, spoke with Dr. Shellia Carwin who will admit the patient - normally sees Dr. Maureen Ralphs for knee issues.  Has a history of asthma well controlled -and hypertension.          Idalia Needle [place], [place] 06/25/11 1645

## 2011-06-25 NOTE — ED Notes (Signed)
Per EMS. Pt from home fell when she slipped on floor and landed on right hip. Pain 8/10. IV left AC 20 gauge and given 100 Fentanyl. NS running.

## 2011-06-25 NOTE — ED Notes (Signed)
Pt back from ct/xray

## 2011-06-26 ENCOUNTER — Encounter (HOSPITAL_COMMUNITY): Payer: Self-pay | Admitting: *Deleted

## 2011-06-26 DIAGNOSIS — S72001A Fracture of unspecified part of neck of right femur, initial encounter for closed fracture: Secondary | ICD-10-CM | POA: Insufficient documentation

## 2011-06-26 LAB — BASIC METABOLIC PANEL
CO2: 26 mEq/L (ref 19–32)
GFR calc non Af Amer: 75 mL/min — ABNORMAL LOW (ref >90)
Glucose, Bld: 118 mg/dL — ABNORMAL HIGH (ref 70–99)
Potassium: 3.5 mEq/L (ref 3.5–5.1)
Sodium: 127 mEq/L — ABNORMAL LOW (ref 135–145)

## 2011-06-26 LAB — CBC
Hemoglobin: 9.7 g/dL — ABNORMAL LOW (ref 12.0–15.0)
MCHC: 34.9 g/dL (ref 30.0–36.0)
RBC: 3.04 MIL/uL — ABNORMAL LOW (ref 3.87–5.11)

## 2011-06-26 MED ORDER — SODIUM CHLORIDE 0.9 % IV SOLN
INTRAVENOUS | Status: DC
  Administered 2011-06-26: 500 mL via INTRAVENOUS

## 2011-06-26 NOTE — Progress Notes (Signed)
Physical Therapy Treatment Patient Details Name: [name] MRN: [id number] DOB: 1923-10-19 Today's Date: 06/26/2011 1403 - 37  GT PT Assessment/Plan  PT - Assessment/Plan PT Plan: Discharge plan remains appropriate PT Frequency: Min 5X/week Follow Up Recommendations: Skilled nursing facility Equipment Recommended: Defer to next venue PT Goals  Acute Rehab PT Goals PT Goal Formulation: With patient Time For Goal Achievement: 7 days Pt will go Supine/Side to Sit: with min assist PT Goal: Supine/Side to Sit - Progress: Not met Pt will go Sit to Supine/Side: with min assist PT Goal: Sit to Supine/Side - Progress: Progressing toward goal Pt will Transfer Sit to Stand/Stand to Sit: with min assist PT Transfer Goal: Sit to Stand/Stand to Sit - Progress: Progressing toward goal Pt will Ambulate: 16 - 50 feet;with min assist;with rolling walker PT Goal: Ambulate - Progress: Progressing toward goal  PT Treatment Precautions/Restrictions  Precautions Precautions: Posterior Hip Precaution Comments:  (handout provided to pt) Restrictions Weight Bearing Restrictions: Yes RUE Weight Bearing: Partial weight bearing RUE Partial Weight Bearing Percentage or Pounds: 50% Mobility (including Balance) Bed Mobility Bed Mobility: Yes Supine to Sit: 1: +2 Total assist (pt 40%) Sit to Supine - Right: 1: +2 Total assist;Patient percentage (comment) Sit to Supine - Right Details (indicate cue type and reason): Pt 35%  Transfers Transfers: Yes Sit to Stand: 1: +2 Total assist;With upper extremity assist;With armrests;From chair/3-in-1 Sit to Stand Details (indicate cue type and reason): cues for LE position and use of UEs Stand to Sit: 1: +2 Total assist;With armrests;To chair/3-in-1;With upper extremity assist Stand to Sit Details: cues for LE position and use of UEs Ambulation/Gait Ambulation/Gait: Yes Ambulation/Gait Assistance: 1: +2 Total assist Ambulation/Gait Assistance Details  (indicate cue type and reason): cues for posture, position from RW and sequecne Ambulation Distance (Feet): 15 Feet Assistive device: Rolling walker Gait Pattern: Step-to pattern Gait velocity: slow    Exercise  Total Joint Exercises Ankle Circles/Pumps: AROM;15 reps;Both;Supine Heel Slides: AAROM;10 reps;Right;Supine Hip ABduction/ADduction: AAROM;10 reps;Supine;Right End of Session PT - End of Session Equipment Utilized During Treatment: Gait belt Activity Tolerance: Patient tolerated treatment well Patient left: in bed;with call bell in reach General Behavior During Session: [facility] for tasks performed Cognition: [facility] for tasks performed  Hilliary Jock 06/26/2011, 2:22 PM

## 2011-06-26 NOTE — Progress Notes (Signed)
Subjective: Patient without complaints today up ambulating already at 10 AM  Objective: Vital signs in last 24 hours: Temp:  [98.1 F (36.7 C)-98.5 F (36.9 C)] 98.4 F (36.9 C) (11/23 0512) Pulse Rate:  [71-88] 72  (11/23 0512) Resp:  [14-20] 16  (11/23 0512) BP: (104-168)/(54-84) 104/55 mmHg (11/23 0512) SpO2:  [95 %-100 %] 100 % (11/23 0512) Weight:  [61.236 kg (135 lb)] 135 lb (61.236 kg) (11/22 2314)  Intake/Output from previous day: 11/22 0701 - 11/23 0700 In: 2635.4 [I.V.:1685.4; Blood:350; IV Piggyback:500] Out: 2125 [Urine:1525; Blood:600] Intake/Output this shift:     Basename 06/26/11 0411 06/25/11 2222 06/25/11 1810  HGB 9.7* 10.5* 11.7*    Basename 06/26/11 0411 06/25/11 2222 06/25/11 1810  WBC 9.4 -- 14.4*  RBC 3.04* -- 3.59*  HCT 27.8* 30.4* --  PLT 197 -- 255    Basename 06/26/11 0411 06/25/11 1810  NA 127* 128*  K 3.5 3.3*  CL 94* 92*  CO2 26 24  BUN 14 17  CREATININE 0.74 0.71  GLUCOSE 118* 110*  CALCIUM 7.8* 9.1   No results found for this basename: LABPT:2,INR:2 in the last 72 hours  The dressing dry moving foot and leg well her her neuro-vascular exam grossly intact Assessment/Plan: #1. Status post bipolar hemiarthroplasty doing well  #2 is operative anemia observe recheck labs in a.m.  #3 hyponatremia fluid restrict adjust meds appropriately recheck in a.m. #4 change dressing tomorrow continue physical therapy occupational therapy   Andreus Cure ANDREW 06/26/2011, 9:22 AM

## 2011-06-26 NOTE — Progress Notes (Signed)
CSW aware and will work with patient and medical team in order to facilitate placement for early next week.  Christene Lye MSW, [place]

## 2011-06-26 NOTE — Progress Notes (Signed)
Physical Therapy Evaluation Patient Details Name: [name] MRN: [id number] DOB: 02-05-1924 Today's Date: 06/26/2011 0855 - 0926 EVAL Problem List:  Patient Active Problem List  Diagnoses  . HYPERTENSION  . ALLERGIC RHINITIS  . Extrinsic asthma, unspecified  . COLONIC POLYPS, BENIGN, HX OF    Past Medical History:  Past Medical History  Diagnosis Date  . Asthma   . Hypertension   . Arthritis   . Depression   . Hyperlipidemia    Past Surgical History:  Past Surgical History  Procedure Date  . Vaginal hysterectomy     PT Assessment/Plan/Recommendation PT Assessment Clinical Impression Statement: Pt with R hip fx and s/p hemi-arthroplasty presents wtih decreased funcitonal mobility and will benefit from skilled PT intervention to maximize IND for next venue of care PT Recommendation/Assessment: Patient will need skilled PT in the acute care venue PT Problem List: Decreased strength;Decreased range of motion;Decreased activity tolerance;Decreased balance;Decreased knowledge of use of DME PT Plan PT Frequency: Min 5X/week PT Treatment/Interventions: DME instruction;Gait training;Stair training;Functional mobility training;Therapeutic exercise;Patient/family education PT Recommendation Recommendations for Other Services: OT consult Follow Up Recommendations: Skilled nursing facility [employer] resident) Equipment Recommended: Defer to next venue PT Goals  Acute Rehab PT Goals PT Goal Formulation: With patient Time For Goal Achievement: 7 days Pt will go Supine/Side to Sit: with min assist PT Goal: Supine/Side to Sit - Progress: Not met Pt will go Sit to Supine/Side: with min assist PT Goal: Sit to Supine/Side - Progress: Not met Pt will Transfer Sit to Stand/Stand to Sit: with min assist PT Transfer Goal: Sit to Stand/Stand to Sit - Progress: Not met Pt will Ambulate: 16 - 50 feet;with min assist;with rolling walker PT Goal: Ambulate - Progress: Not met  PT  Evaluation Precautions/Restrictions  Precautions Precautions: Posterior Hip Precaution Comments:  (handout provided to pt) Restrictions Weight Bearing Restrictions: Yes RUE Weight Bearing: Partial weight bearing RUE Partial Weight Bearing Percentage or Pounds: 50% Prior Functioning  Home Living Lives With: Alone Receives Help From: Family;Other (Comment) (Pt lives at [place]) Prior Function Level of Independence: Independent with basic ADLs;Independent with gait;Independent with transfers Able to Take Stairs?: Yes Cognition   Sensation/Coordination Coordination Gross Motor Movements are Fluid and Coordinated: Yes Extremity Assessment RUE Assessment RUE Assessment: Within Functional Limits LUE Assessment LUE Assessment: Within Functional Limits RLE Assessment RLE Assessment: Exceptions to [facility] RLE PROM (degrees) Overall PROM Right Lower Extremity:  (WFL following THP) RLE Strength RLE Overall Strength:  (3-/5 quads, 2+/5 hip) LLE Assessment LLE Assessment: Within Functional Limits Mobility (including Balance) Bed Mobility Bed Mobility: Yes Supine to Sit: 1: +2 Total assist (pt 40%) Transfers Transfers: Yes Sit to Stand: 1: +2 Total assist;With upper extremity assist;From bed (Pt 60%) Sit to Stand Details (indicate cue type and reason): cues for LE position and use of UEs Stand to Sit: 1: +2 Total assist;With armrests;To chair/3-in-1;With upper extremity assist (Pt 60%) Stand to Sit Details: cues for LE position and use of UEs Ambulation/Gait Ambulation/Gait: Yes Ambulation/Gait Assistance: 1: +2 Total assist (pt 65%) Ambulation/Gait Assistance Details (indicate cue type and reason): cues for posture, position from RW and sequecne Ambulation Distance (Feet): 9 Feet Assistive device: Rolling walker Gait Pattern: Step-to pattern Gait velocity: slow    Exercise  Total Joint Exercises Ankle Circles/Pumps: AROM;15 reps;Both;Supine Heel Slides: AAROM;10  reps;Right;Supine Hip ABduction/ADduction: AAROM;10 reps;Supine;Right End of Session PT - End of Session Equipment Utilized During Treatment: Gait belt Activity Tolerance: Patient tolerated treatment well Patient left: in chair;with call bell in  reach General Behavior During Session: [facility] for tasks performed Cognition: [facility] for tasks performed  Javid Kemler 06/26/2011, 2:00 PM

## 2011-06-27 LAB — CBC
Hemoglobin: 8.8 g/dL — ABNORMAL LOW (ref 12.0–15.0)
MCH: 32.2 pg (ref 26.0–34.0)
RBC: 2.73 MIL/uL — ABNORMAL LOW (ref 3.87–5.11)
WBC: 7.8 10*3/uL (ref 4.0–10.5)

## 2011-06-27 NOTE — Progress Notes (Addendum)
Occupational Therapy Evaluation Patient Details Name: [name] MRN: [id number] DOB: 11/06/1923 Today's Date: 06/27/2011 10:07-10:38 Co-session with PT  Problem List:  Patient Active Problem List  Diagnoses  . HYPERTENSION  . ALLERGIC RHINITIS  . Extrinsic asthma, unspecified  . COLONIC POLYPS, BENIGN, HX OF    Past Medical History:  Past Medical History  Diagnosis Date  . Asthma   . Hypertension   . Arthritis   . Depression   . Hyperlipidemia    Past Surgical History:  Past Surgical History  Procedure Date  . Vaginal hysterectomy     OT Assessment/Plan/Recommendation OT Assessment Clinical Impression Statement: This 77 yo female s/p fall with now RTHA presents to acute OT with the following deficts thus affecting pts PLOF at I. Will benefit from acute OT with followup OT at SNF to get to an I/Mod I level. OT Recommendation/Assessment: Patient will need skilled OT in the acute care venue OT Problem List: Decreased strength;Decreased activity tolerance;Impaired balance (sitting and/or standing);Decreased cognition;Decreased safety awareness;Decreased knowledge of use of DME or AE;Decreased knowledge of precautions Barriers to Discharge: Decreased caregiver support OT Therapy Diagnosis : Generalized weakness;Cognitive deficits;Acute pain OT Plan OT Frequency: Min 2X/week OT Treatment/Interventions: Self-care/ADL training;DME and/or AE instruction;Therapeutic activities;Patient/family education;Balance training OT Recommendation Follow Up Recommendations: Skilled nursing facility Equipment Recommended: Defer to next venue Individuals Consulted Consulted and Agree with Results and Recommendations: Patient OT Goals Acute Rehab OT Goals OT Goal Formulation: With patient Time For Goal Achievement: 2 weeks;7 days ADL Goals Pt Will Perform Grooming: with set-up;with supervision;Standing at sink;Other (comment) (2 tasks with min guard A standing) ADL Goal: Grooming -  Progress: Progressing toward goals Pt Will Perform Lower Body Dressing: with mod assist;Sit to stand from bed;Sit to stand from chair;with adaptive equipment ADL Goal: Lower Body Dressing - Progress: Progressing toward goals Pt Will Transfer to Toilet: Other (comment);Ambulation;3-in-1;Maintaining weight bearing status (min guard A, 3-n-1 over toliet) ADL Goal: Toilet Transfer - Progress: Progressing toward goals Pt Will Perform Toileting - Clothing Manipulation: with min assist;Standing ADL Goal: Toileting - Clothing Manipulation - Progress: Progressing toward goals Pt Will Perform Toileting - Hygiene: Independently;Sit to stand from 3-in-1/toilet ADL Goal: Toileting - Hygiene - Progress: Progressing toward goals Additional ADL Goal #1: PT will be able to state 3/3 hip precautions and follow them with min verbal cues. ADL Goal: Additional Goal #1 - Progress: Progressing toward goals Additional ADL Goal #2: Pt will be min A with HOB down and no rail to get to the EOB to prepare for transfers and A with BADLs ADL Goal: Additional Goal #2 - Progress: Progressing toward goals  OT Evaluation Precautions/Restrictions  Precautions Precautions: Posterior Hip Precaution Comments:  (handout provided to pt) Required Braces or Orthoses: No Restrictions Weight Bearing Restrictions: Yes RLE Weight Bearing: Partial weight bearing RLE Partial Weight Bearing Percentage or Pounds: 50% Prior Functioning Home Living Lives With: Alone Type of Home: House Home Layout: One level Bathroom Shower/Tub: Multimedia programmer: Handicapped height Prior Function Level of Independence: Independent with basic ADLs;Independent with homemaking with ambulation;Independent with gait;Independent with transfers Vocation: Retired ADL ADL Eating/Feeding: Simulated;Independent Where Assessed - Eating/Feeding: Chair Grooming: Performed;Brushing hair;Supervision/safety;Set up;Other (comment) (min A sitting  balance) Where Assessed - Grooming: Sitting, bed Upper Body Bathing: Simulated;Supervision/safety;Set up (min A sitting balance) Where Assessed - Upper Body Bathing: Sitting, bed Lower Body Bathing: Simulated;Maximal assistance Lower Body Bathing Details (indicate cue type and reason): Pt with decreased balance sitting and standing, cannot gather her own items safely,  and now has posterior hip precautions Where Assessed - Lower Body Bathing: Sit to stand from bed Upper Body Dressing: Simulated;Supervision/safety;Set up;Other (comment) (Pt with decreased sitting balance) Where Assessed - Upper Body Dressing: Sitting, bed Lower Body Dressing: Simulated;+1 Total assistance Lower Body Dressing Details (indicate cue type and reason): Pt with decreased balance sitting and standing, cannot gather her own items safely, and now has posterior hip precautions Where Assessed - Lower Body Dressing: Sit to stand from bed Toilet Transfer: Performed;Minimal assistance Toilet Transfer Details (indicate cue type and reason): Pt with decreased balance sit and standing thus cannot safely get to 3-n-1 over toliet, also needs Max cueing for sequencing with ambulation with RW Toilet Transfer Method: Ambulating Toilet Transfer Equipment: Raised toilet seat with arms (or 3-in-1 over toilet) Toileting - Clothing Manipulation: Performed;Supervision/safety;+1 Total assistance Toileting - Clothing Manipulation Details (indicate cue type and reason): For safety with hip precautions with sit to stand and decreased standing balance Where Assessed - Toileting Clothing Manipulation: Sit to stand from 3-in-1 or toilet Toileting - Hygiene: Performed;Independent Where Assessed - Toileting Hygiene: Sit on 3-in-1 or toilet Tub/Shower Transfer Method: Not assessed Equipment Used: Rolling walker;Other (comment) (3-n-1) Vision/Perception  Vision - History Baseline Vision: Wears glasses all the  time Cognition Cognition Arousal/Alertness: Awake/alert Overall Cognitive Status:  (Pt recalled 2/3 precautions (forgot crossing legs)) Memory: Decreased recall of precautions Orientation Level: Oriented X4 Safety/Judgement: Decreased awareness of safety precautions;Decreased safety judgement for tasks assessed Decreased Safety/Judgement: Other (comment) (with use of RW and hip precautions) Problem Solving: Requires assistance for problem solving Sensation/Coordination   Extremity Assessment RUE Assessment RUE Assessment: Within Functional Limits LUE Assessment LUE Assessment: Within Functional Limits Mobility    Exercises   End of Session OT - End of Session Equipment Utilized During Treatment: Gait belt;Other (comment) (RW, 3-n-1) Activity Tolerance: Treatment limited secondary to medical complications (Comment);Other (comment) (drop in BP) Patient left: in chair;with call bell in reach Nurse Communication: Mobility status for transfers General Behavior During Session: [facility] for tasks performed Cognition: Impaired   Almon Register, OTR/L [id number] 06/27/2011, 11:21 AM

## 2011-06-27 NOTE — ED Provider Notes (Signed)
Medical screening examination/treatment/procedure(s) were conducted as a shared visit with non-physician practitioner(s) and myself.  I personally evaluated the patient during the encounter  Leota Jacobsen, MD 06/27/11 1426

## 2011-06-27 NOTE — Progress Notes (Signed)
Subjective: 2 Days Post-Op Procedure(s) (LRB): ARTHROPLASTY BIPOLAR HIP (Right) Patient reports pain as 3 on 0-10 scale.  No chest pain or SOB. Vital signs in last 24 hours: Temp:  [98.1 F (36.7 C)-100 F (37.8 C)] 99.2 F (37.3 C) (11/24 0640) Pulse Rate:  [61-69] 66  (11/24 0640) Resp:  [16] 16  (11/24 0640) BP: (97-124)/(50-62) 97/50 mmHg (11/24 0640) SpO2:  [98 %-100 %] 98 % (11/24 0640)  Intake/Output from previous day: 11/23 0701 - 11/24 0700 In: 484.3 [P.O.:360; I.V.:124.3] Out: 1500 [Urine:1500] Intake/Output this shift:     Basename 06/27/11 0425 06/26/11 0411 06/25/11 2222 06/25/11 1810  HGB 8.8* 9.7* 10.5* 11.7*    Basename 06/27/11 0425 06/26/11 0411  WBC 7.8 9.4  RBC 2.73* 3.04*  HCT 24.8* 27.8*  PLT 157 197    Basename 06/26/11 0411 06/25/11 1810  NA 127* 128*  K 3.5 3.3*  CL 94* 92*  CO2 26 24  BUN 14 17  CREATININE 0.74 0.71  GLUCOSE 118* 110*  CALCIUM 7.8* 9.1   No results found for this basename: LABPT:2,INR:2 in the last 72 hours  Neurologically intact. Abdomen soft. Wound intact. No infection. No DVT.  Assessment/Plan: 2 Days Post-Op Procedure(s) (LRB): ARTHROPLASTY BIPOLAR HIP (Right) PT. If symptomatic then transfusion. Discussed with patient. Repeat BMET. Check Na. May be dehydrated. I/O negative since surgery. Will D/C fluid restriction.  Sherine Cortese C 06/27/2011, 8:35 AM

## 2011-06-27 NOTE — Progress Notes (Signed)
Physical Therapy Treatment Patient Details Name: [name] MRN: [id number] DOB: [birth year] Today's Date: 06/27/2011 1014 -68  GT, TA PT Assessment/Plan  PT - Assessment/Plan PT Plan: Discharge plan remains appropriate PT Frequency: Min 5X/week Follow Up Recommendations: Skilled nursing facility Equipment Recommended: Defer to next venue PT Goals  Acute Rehab PT Goals PT Goal: Supine/Side to Sit - Progress: Progressing toward goal PT Goal: Sit to Supine/Side - Progress: Progressing toward goal PT Transfer Goal: Sit to Stand/Stand to Sit - Progress: Progressing toward goal PT Goal: Ambulate - Progress: Progressing toward goal  PT Treatment Precautions/Restrictions  Precautions Precautions: Posterior Hip Precaution Comments:  (handout provided to pt) Required Braces or Orthoses: No Restrictions Weight Bearing Restrictions: Yes RUE Weight Bearing: Weight bearing as tolerated RUE Partial Weight Bearing Percentage or Pounds: 50% RLE Weight Bearing: Partial weight bearing RLE Partial Weight Bearing Percentage or Pounds: 50% Mobility (including Balance) Bed Mobility Supine to Sit: 1: +2 Total assist Supine to Sit Details (indicate cue type and reason): pt50% Transfers Sit to Stand: 1: +2 Total assist;From bed;From chair/3-in-1;With upper extremity assist;With armrests;Without upper extremity assist Sit to Stand Details (indicate cue type and reason): cues for LE position and use of UEs Stand to Sit: 1: +2 Total assist;To chair/3-in-1;With armrests;With upper extremity assist Stand to Sit Details: pt  Ambulation/Gait Ambulation/Gait Assistance: 1: +2 Total assist Ambulation/Gait Assistance Details (indicate cue type and reason): pt 70% with cues for sequence, posture and position from RW Ambulation Distance (Feet):  (2x15') Assistive device: Rolling walker Gait Pattern: Step-to pattern Gait velocity: slow    Exercise    End of Session PT - End of Session Activity  Tolerance: Treatment limited secondary to medical complications (Comment) (pt with c/o dizziness - see BP reading) Patient left: in chair;with call bell in reach General Behavior During Session: [name] for tasks performed Cognition: Impaired  [name] 06/27/2011, 1:49 PM

## 2011-06-28 LAB — BASIC METABOLIC PANEL
BUN: 15 mg/dL (ref 6–23)
CO2: 26 mEq/L (ref 19–32)
Chloride: 91 mEq/L — ABNORMAL LOW (ref 96–112)
Creatinine, Ser: 0.75 mg/dL (ref 0.50–1.10)
GFR calc Af Amer: 87 mL/min — ABNORMAL LOW (ref >90)
Glucose, Bld: 123 mg/dL — ABNORMAL HIGH (ref 70–99)

## 2011-06-28 LAB — CBC
HCT: 24.5 % — ABNORMAL LOW (ref 36.0–46.0)
Hemoglobin: 8.7 g/dL — ABNORMAL LOW (ref 12.0–15.0)
MCV: 89.7 fL (ref 78.0–100.0)
RDW: 14.9 % (ref 11.5–15.5)
WBC: 9.9 10*3/uL (ref 4.0–10.5)

## 2011-06-28 LAB — URINE CULTURE: Special Requests: NORMAL

## 2011-06-28 NOTE — Progress Notes (Signed)
Subjective: 3 Days Post-Op Procedure(s) (LRB): ARTHROPLASTY BIPOLAR HIP (Right) Patient reports pain as mild.   Patient seen in rounds with Dr. Wynelle Link. Patient has complaints of soreness. She is doing fairly well overall. Not moved bowels yet. Did get a little lightheaded yesterday.  She may need blood.  Hgb 8.7 today (stable from yesterday 8.8).  If symptoms, then blood today.  Objective: Vital signs in last 24 hours: Temp:  [97.8 F (36.6 C)-99.8 F (37.7 C)] 98.1 F (36.7 C) (11/25 0540) Pulse Rate:  [65-81] 65  (11/25 0540) Resp:  [16] 16  (11/25 0540) BP: (86-134)/(45-66) 105/55 mmHg (11/25 0540) SpO2:  [94 %-95 %] 94 % (11/25 0540)  Intake/Output from previous day:  Intake/Output Summary (Last 24 hours) at 06/28/11 0756 Last data filed at 06/28/11 0540  Gross per 24 hour  Intake    780 ml  Output    200 ml  Net    580 ml    Intake/Output this shift:    Labs: Results for orders placed during the hospital encounter of 06/25/11  URINE CULTURE      Component Value Range   Specimen Description URINE, CATHETERIZED     Special Requests Normal     Setup Time [id number]     Colony Count 4,000 COLONIES/ML     Culture ESCHERICHIA COLI     Report Status 06/28/2011 FINAL     Organism ID, Bacteria ESCHERICHIA COLI    URINALYSIS, ROUTINE W REFLEX MICROSCOPIC      Component Value Range   Color, Urine YELLOW  YELLOW    Appearance CLEAR  CLEAR    Specific Gravity, Urine 1.012  1.005 - 1.030    pH 7.0  5.0 - 8.0    Glucose, UA NEGATIVE  NEGATIVE (mg/dL)   Hgb urine dipstick NEGATIVE  NEGATIVE    Bilirubin Urine NEGATIVE  NEGATIVE    Ketones, ur NEGATIVE  NEGATIVE (mg/dL)   Protein, ur NEGATIVE  NEGATIVE (mg/dL)   Urobilinogen, UA 0.2  0.0 - 1.0 (mg/dL)   Nitrite NEGATIVE  NEGATIVE    Leukocytes, UA SMALL (*) NEGATIVE   URINE MICROSCOPIC-ADD ON      Component Value Range   WBC, UA 0-2  <3 (WBC/hpf)   RBC / HPF 0-2  <3 (RBC/hpf)  CBC      Component Value Range   WBC  14.4 (*) 4.0 - 10.5 (K/uL)   RBC 3.59 (*) 3.87 - 5.11 (MIL/uL)   Hemoglobin 11.7 (*) 12.0 - 15.0 (g/dL)   HCT 33.0 (*) 36.0 - 46.0 (%)   MCV 91.9  78.0 - 100.0 (fL)   MCH 32.6  26.0 - 34.0 (pg)   MCHC 35.5  30.0 - 36.0 (g/dL)   RDW 13.6  11.5 - 15.5 (%)   Platelets 255  150 - 400 (K/uL)  DIFFERENTIAL      Component Value Range   Neutrophils Relative 92 (*) 43 - 77 (%)   Neutro Abs 13.1 (*) 1.7 - 7.7 (K/uL)   Lymphocytes Relative 4 (*) 12 - 46 (%)   Lymphs Abs 0.6 (*) 0.7 - 4.0 (K/uL)   Monocytes Relative 4  3 - 12 (%)   Monocytes Absolute 0.6  0.1 - 1.0 (K/uL)   Eosinophils Relative 0  0 - 5 (%)   Eosinophils Absolute 0.1  0.0 - 0.7 (K/uL)   Basophils Relative 0  0 - 1 (%)   Basophils Absolute 0.0  0.0 - 0.1 (K/uL)  COMPREHENSIVE METABOLIC PANEL  Component Value Range   Sodium 128 (*) 135 - 145 (mEq/L)   Potassium 3.3 (*) 3.5 - 5.1 (mEq/L)   Chloride 92 (*) 96 - 112 (mEq/L)   CO2 24  19 - 32 (mEq/L)   Glucose, Bld 110 (*) 70 - 99 (mg/dL)   BUN 17  6 - 23 (mg/dL)   Creatinine, Ser 0.71  0.50 - 1.10 (mg/dL)   Calcium 9.1  8.4 - 10.5 (mg/dL)   Total Protein 6.6  6.0 - 8.3 (g/dL)   Albumin 3.8  3.5 - 5.2 (g/dL)   AST 25  0 - 37 (U/L)   ALT 20  0 - 35 (U/L)   Alkaline Phosphatase 81  39 - 117 (U/L)   Total Bilirubin 0.7  0.3 - 1.2 (mg/dL)   GFR calc non Af Amer 76 (*) >90 (mL/min)   GFR calc Af Amer 89 (*) >90 (mL/min)  TYPE AND SCREEN      Component Value Range   ABO/RH(D) O NEG     Antibody Screen NEG     Sample Expiration 06/28/2011     Unit Number [id number]     Blood Component Type RED CELLS,LR     Unit division 00     Status of Unit ISSUED,FINAL     Transfusion Status OK TO TRANSFUSE     Crossmatch Result Compatible     Unit Number [id number]     Blood Component Type RED CELLS,LR     Unit division 00     Status of Unit ALLOCATED     Transfusion Status OK TO TRANSFUSE     Crossmatch Result Compatible    ABO/RH      Component Value Range   ABO/RH(D) O NEG      PREPARE RBC (CROSSMATCH)      Component Value Range   Order Confirmation ORDER PROCESSED BY BLOOD BANK    HEMOGLOBIN AND HEMATOCRIT, BLOOD      Component Value Range   Hemoglobin 10.5 (*) 12.0 - 15.0 (g/dL)   HCT 30.4 (*) 36.0 - 46.0 (%)  CBC      Component Value Range   WBC 9.4  4.0 - 10.5 (K/uL)   RBC 3.04 (*) 3.87 - 5.11 (MIL/uL)   Hemoglobin 9.7 (*) 12.0 - 15.0 (g/dL)   HCT 27.8 (*) 36.0 - 46.0 (%)   MCV 91.4  78.0 - 100.0 (fL)   MCH 31.9  26.0 - 34.0 (pg)   MCHC 34.9  30.0 - 36.0 (g/dL)   RDW 15.5  11.5 - 15.5 (%)   Platelets 197  150 - 400 (K/uL)  BASIC METABOLIC PANEL      Component Value Range   Sodium 127 (*) 135 - 145 (mEq/L)   Potassium 3.5  3.5 - 5.1 (mEq/L)   Chloride 94 (*) 96 - 112 (mEq/L)   CO2 26  19 - 32 (mEq/L)   Glucose, Bld 118 (*) 70 - 99 (mg/dL)   BUN 14  6 - 23 (mg/dL)   Creatinine, Ser 0.74  0.50 - 1.10 (mg/dL)   Calcium 7.8 (*) 8.4 - 10.5 (mg/dL)   GFR calc non Af Amer 75 (*) >90 (mL/min)   GFR calc Af Amer 87 (*) >90 (mL/min)  CBC      Component Value Range   WBC 7.8  4.0 - 10.5 (K/uL)   RBC 2.73 (*) 3.87 - 5.11 (MIL/uL)   Hemoglobin 8.8 (*) 12.0 - 15.0 (g/dL)   HCT 24.8 (*) 36.0 - 46.0 (%)  MCV 90.8  78.0 - 100.0 (fL)   MCH 32.2  26.0 - 34.0 (pg)   MCHC 35.5  30.0 - 36.0 (g/dL)   RDW 15.2  11.5 - 15.5 (%)   Platelets 157  150 - 400 (K/uL)  CBC      Component Value Range   WBC 9.9  4.0 - 10.5 (K/uL)   RBC 2.73 (*) 3.87 - 5.11 (MIL/uL)   Hemoglobin 8.7 (*) 12.0 - 15.0 (g/dL)   HCT 24.5 (*) 36.0 - 46.0 (%)   MCV 89.7  78.0 - 100.0 (fL)   MCH 31.9  26.0 - 34.0 (pg)   MCHC 35.5  30.0 - 36.0 (g/dL)   RDW 14.9  11.5 - 15.5 (%)   Platelets 163  150 - 400 (K/uL)    Exam - Neurovascular intact Sensation intact distally Intact pulses distally Dressing/Incision - clean, no drainage, healing. Motor function intact - moving foot and toes well on exam.   Assessment/Plan: 3 Days Post-Op Procedure(s) (LRB): ARTHROPLASTY BIPOLAR HIP  (Right) Postop ABLA Postop Hyponatremia  Past Medical History  Diagnosis Date  . Asthma   . Hypertension   . Arthritis   . Depression   . Hyperlipidemia     Up with therapy  DVT Prophylaxis - Xarelto / Coumadin Protocol WBAT right leg Blood if symptoms. Recheck Na also.  [name] 06/28/2011, 7:56 AM

## 2011-06-29 ENCOUNTER — Encounter (HOSPITAL_COMMUNITY): Payer: Self-pay | Admitting: Orthopedic Surgery

## 2011-06-29 LAB — TYPE AND SCREEN
ABO/RH(D): O NEG
Antibody Screen: NEGATIVE
Unit division: 0
Unit division: 0

## 2011-06-29 MED ORDER — HYDROCODONE-ACETAMINOPHEN 5-325 MG PO TABS: 1.0000 | ORAL_TABLET | ORAL | Status: AC | PRN

## 2011-06-29 MED ORDER — RIVAROXABAN 10 MG PO TABS: 10.0000 mg | ORAL_TABLET | ORAL | Status: DC

## 2011-06-29 MED ORDER — CIPROFLOXACIN HCL 500 MG PO TABS: 500.0000 mg | ORAL_TABLET | Freq: Two times a day (BID) | ORAL | Status: AC

## 2011-06-29 NOTE — Progress Notes (Signed)
CSW assisted with D/C planning back to [place] [facility] today via [facility] transport.

## 2011-06-29 NOTE — Discharge Summary (Signed)
Physician Discharge Summary  Patient ID: [name] MRN: [id number] DOB/AGE: 04/27/1924 77 y.o.  Admit date: 06/25/2011 Discharge date: 06/29/2011  Admission Diagnoses:Displaced rt. Fem. neck fracture  Discharge Diagnoses: See dictation Active Problems:  * No active hospital problems. *    Discharged Condition: stable  Hospital Course:see dictation  Consults: none  Significant Diagnostic Studies: microbiology: urine culture: positive for ECOLI    and radiology: X-Ray: Post-op Anatomical   Treatments: surgery: Hemiarthroplasty Rt. Hip  Discharge Exam: Blood pressure 146/71, pulse 76, temperature 98.1 F (36.7 C), temperature source Oral, resp. rate 17, height 5\' 3"  (1.6 m), weight 61.236 kg (135 lb), SpO2 93.00%. Incision/Wound:  Disposition: Rehab  Discharge Orders    Future Appointments: Provider: Department: Dept Phone: Center:   08/05/2011 11:15 AM Asencion Noble, MD [facility] [phone] None     Current Discharge Medication List    START taking these medications   Details  ciprofloxacin (CIPRO) 500 MG tablet Take 1 tablet (500 mg total) by mouth 2 (two) times daily. Qty: 20 tablet, Refills: 0    HYDROcodone-acetaminophen (NORCO) 5-325 MG per tablet Take 1-2 tablets by mouth every 4 (four) hours as needed. Qty: 30 tablet, Refills: 1    rivaroxaban (XARELTO) 10 MG TABS tablet Take 1 tablet (10 mg total) by mouth daily. Qty: 12 tablet, Refills: 0      CONTINUE these medications which have NOT CHANGED   Details  acetaminophen (TYLENOL) 500 MG tablet Take 500 mg by mouth every 4 (four) hours as needed.      ADVAIR DISKUS 250-50 MCG/DOSE AEPB Take 1 puff by mouth 2 (two) times daily. Qty: 60 each, Refills: 6   Associated Diagnoses: Asthma with allergic rhinitis    albuterol (PROVENTIL HFA;VENTOLIN HFA) 108 (90 BASE) MCG/ACT inhaler Inhale 2 puffs into the lungs every 6 (six) hours as needed.      amLODipine (NORVASC) 5 MG tablet Take 5 mg by  mouth daily.      calcium-vitamin D (OSCAL WITH D) 500-200 MG-UNIT per tablet Take 1 tablet by mouth daily.      cetirizine (ZYRTEC) 10 MG tablet Take 1 tablet (10 mg total) by mouth daily. Qty: 30 tablet, Refills: 6   Associated Diagnoses: Asthma with allergic rhinitis    FLUoxetine (PROZAC) 20 MG capsule Take 40 mg by mouth daily.     fluticasone (FLONASE) 50 MCG/ACT nasal spray Place 2 sprays into the nose daily. Qty: 16 g, Refills: 6   Associated Diagnoses: Asthma with allergic rhinitis    hydrochlorothiazide 25 MG tablet Take 25 mg by mouth daily.      montelukast (SINGULAIR) 10 MG tablet Take 1 tablet (10 mg total) by mouth daily. Qty: 30 tablet, Refills: 6   Associated Diagnoses: Asthma with allergic rhinitis    Multiple Vitamin (MULTIVITAMIN) tablet Take 1 tablet by mouth daily.      olmesartan (BENICAR) 40 MG tablet Take 40 mg by mouth daily.      omalizumab (XOLAIR) 150 MG injection 300 mg every 2 weeks     predniSONE (DELTASONE) 10 MG tablet Take 4 for two days three for two days two for two days one for two days Qty: 20 tablet, Refills: 0   Associated Diagnoses: Asthma with acute exacerbation    50% wt. Bearing to RLE.   Follow-up Information    Make an appointment in 2 weeks to follow up. (Two weeks from surgery.)          Signed: Rahel Carlton III,Inez Rosato L  06/29/2011, 8:04 AM

## 2011-06-29 NOTE — Discharge Summary (Signed)
NAMEAMZIE, MARCK NO.:  [id number]  MEDICAL RECORD NO.:  [id number]  LOCATION:  44                         FACILITY:  [facility]  PHYSICIAN:  Tarri Glenn, M.D.  DATE OF BIRTH:  [birth year]  DATE OF ADMISSION:  06/25/2011 DATE OF DISCHARGE:  06/29/2011                        DISCHARGE SUMMARY - REFERRING   ADMITTING DIAGNOSES: 1. Displaced femoral neck fracture of the right hip. 2. Hypertension. 3. Allergic rhinitis. 4. Intrinsic asthma, unspecified. 5. Benign history of colonic polyps.  DISCHARGE DIAGNOSES: 1. Displaced femoral neck fracture of the right hip. 2. Hypertension. 3. Allergic rhinitis. 4. Intrinsic asthma, unspecified. 5. Benign history of colonic polyps. 6. Acute postoperative anemia.  BRIEF HISTORY:  This 77 year old white female who was walking in her residence at [facility] and had a trip on some object and fell.  She had immediate pain to the right hip and was brought to the emergency room where x-rays revealed a displaced femoral neck fracture of the right hip.  She was in relatively good health and a very active lady, and it was felt she would benefit from surgical intervention, was then admitted for hemiarthroplasty with a bipolar prosthesis of the right hip.  COURSE IN THE HOSPITAL:  She has tolerated the surgical procedure quite well.  She did have some mild postoperative anemia, but she was anemic slightly on admission.  We had her preop with anesthesia and they felt it was safe to go ahead with surgical intervention.  It was noted that her hemoglobin on admission was 11.7 and her final hemoglobin was 8.7. She was asymptomatic.  She participated with physical therapy.  She was taught the hip protocol to avoid flexing of the hip beyond 90 degrees and internal and external rotation avoidance.  She wished to have slept with a knee immobilizer and then when she was up and about, however, this was not done, but she will do it at  the rehab area.  The wound was clean and dry.  Neurovascularly remained intact in the lower extremity.  She tolerated the analgesics given, which primarily was hydrocodone (Norco).  It was felt that she could continue with this for her pain control and if spasms are present, Robaxin 500 mg 1 p.o. q.6 hours p.r.n., muscle spasm can be given per rehab physician's decision.  It was noted in the hospital on her catheterized urine preoperatively that she did have E coli in her urine, which grew out as a culture.  We will begin treatment with Cipro 500 mg 1 b.i.d. for about 10 days.  If the rehab facility decides to go ahead and repeat the urinalysis and culture, then perfectly all right.  We would like to see her back in the office about 2 weeks after the date of surgery.  Dry dressing could be applied to the right hip as necessary and she is to partial weight bear on the right.  As far as a knee immobilizer is concerned, we would like for her to use a knee immobilizer when sleeping to avoid bending the hip above 90 degrees or too much internal or external rotation with it flexed.  When she is up ambulating, this might provide her with more  support as she tries to 50% weight bear on the right.  Should there be any questions or problems, welcome to call us at [phone] and as per Dr. Pearla Dubonnet office.  She is to continue with her regular diet.  Continue with all her medications which are attached and listed from the [facility] computer program.  Any other questions and problems should be sent to her family physician.  CONDITION ON DISCHARGE:  Improved, stable.     Seraphim Trow L. [name].   ______________________________ Tarri Glenn, M.D.    DLU/MEDQ  D:  06/29/2011  T:  06/29/2011  Job:  [id number]  cc:   Tarri Glenn, M.D. Fax: [phone]

## 2011-06-29 NOTE — H&P (Signed)
H&P dictated, 06/25/2011, [id number]. Priority discharge summary dictated # [id number].

## 2011-06-29 NOTE — Progress Notes (Signed)
Physical Therapy Treatment Patient Details Name: [name] MRN: [id number] DOB: 08-10-1923 Today's Date: 06/29/2011 1150-1210 [place] PT Assessment/Plan  PT - Assessment/Plan Comments on Treatment Session: pt tolerated well, pt expresses concern that she urinates frequently PT Plan: Discharge plan remains appropriate PT Frequency: Min 5X/week Follow Up Recommendations: Skilled nursing facility Equipment Recommended: Defer to next venue PT Goals  Acute Rehab PT Goals Pt will go Supine/Side to Sit: with min assist PT Goal: Supine/Side to Sit - Progress: Progressing toward goal  PT Treatment Precautions/Restrictions  Precautions Precautions: Posterior Hip Precaution Comments:  (handout provided to pt) Required Braces or Orthoses: No Restrictions Weight Bearing Restrictions: Yes RUE Weight Bearing: Weight bearing as tolerated RUE Partial Weight Bearing Percentage or Pounds: 50% RLE Weight Bearing: Partial weight bearing RLE Partial Weight Bearing Percentage or Pounds: 50% Mobility (including Balance) Bed Mobility Bed Mobility: Yes Rolling Right: 1: +2 Total assist;With rail (pt=30,towel roll between legs) Rolling Right Details (indicate cue type and reason): assist to support RLE, turn on bed Rolling Left: 1: +2 Total assist;With rail Rolling Left Details (indicate cue type and reason): tc to reach for rail Supine to Sit:  (pt declined, states she is leaving today) Transfers Transfers: No    Exercise    End of Session PT - End of Session Activity Tolerance: Treatment limited secondary to medical complications (Comment);Patient tolerated treatment well Patient left: in bed (pt incontinent frequently of bladder) General Behavior During Session: [facility] for tasks performed  Claretha Cooper 06/29/2011, 3:27 PM

## 2011-07-06 ENCOUNTER — Telehealth: Payer: Self-pay | Admitting: Critical Care Medicine

## 2011-07-06 NOTE — Telephone Encounter (Signed)
I didn't realize [name] is a nursing home. Sorry to hear that,she was doing so well the last time I saw her. We have a dose on hand;unless [name] or Dr.Wright says any different the nursing home will have to order the xolair.

## 2011-07-06 NOTE — Telephone Encounter (Signed)
Suanne Marker, can you help with this please, thanks

## 2011-07-06 NOTE — Telephone Encounter (Signed)
Spoke with Verdis Frederickson. She states has the orders for xolair for pt but no medicine. She states that she called pt's pharmacy and they do not have it either. Will forward msg to Tammy in allergy lab to see if her med was shipped here. Please advise, thanks!

## 2011-07-07 ENCOUNTER — Telehealth: Payer: Self-pay | Admitting: Critical Care Medicine

## 2011-07-07 NOTE — Telephone Encounter (Signed)
Spoke with patient's nurse and explained the policy of administration of xolair. [facility] is able to bring the patient here for her xolair injection and appointment has been scheduled for Thurs. 07/09/11 at 2:30.

## 2011-07-07 NOTE — Telephone Encounter (Signed)
Per rhonda she has taken care of this. Will forward to her so she can document

## 2011-07-08 NOTE — Telephone Encounter (Signed)
Manuela Schwartz- nurse at [facility] called this morning with questions re: rescheduling pt for xolair. [id number]. Mariann Laster

## 2011-07-08 NOTE — Telephone Encounter (Signed)
Called and spoke with Manuela Schwartz at [facility] and appointment for xolair injection has been changed to Friday, Dec 7th at 11:00. Pt is aware.

## 2011-07-09 ENCOUNTER — Ambulatory Visit: Payer: Medicare Other

## 2011-07-10 ENCOUNTER — Ambulatory Visit (INDEPENDENT_AMBULATORY_CARE_PROVIDER_SITE_OTHER): Payer: Medicare Other

## 2011-07-10 DIAGNOSIS — J45909 Unspecified asthma, uncomplicated: Secondary | ICD-10-CM

## 2011-07-14 DIAGNOSIS — J45909 Unspecified asthma, uncomplicated: Secondary | ICD-10-CM

## 2011-07-14 MED ORDER — OMALIZUMAB 150 MG ~~LOC~~ SOLR
300.0000 mg | Freq: Once | SUBCUTANEOUS | Status: AC
  Administered 2011-07-14: 300 mg via SUBCUTANEOUS

## 2011-07-15 ENCOUNTER — Ambulatory Visit: Payer: Medicare Other | Admitting: Critical Care Medicine

## 2011-07-24 ENCOUNTER — Ambulatory Visit (INDEPENDENT_AMBULATORY_CARE_PROVIDER_SITE_OTHER): Payer: Medicare Other

## 2011-07-24 DIAGNOSIS — J45909 Unspecified asthma, uncomplicated: Secondary | ICD-10-CM

## 2011-07-30 DIAGNOSIS — J45909 Unspecified asthma, uncomplicated: Secondary | ICD-10-CM

## 2011-07-30 MED ORDER — OMALIZUMAB 150 MG ~~LOC~~ SOLR
300.0000 mg | Freq: Once | SUBCUTANEOUS | Status: AC
  Administered 2011-07-30: 300 mg via SUBCUTANEOUS

## 2011-08-04 DIAGNOSIS — R269 Unspecified abnormalities of gait and mobility: Secondary | ICD-10-CM | POA: Diagnosis not present

## 2011-08-04 DIAGNOSIS — M2550 Pain in unspecified joint: Secondary | ICD-10-CM | POA: Diagnosis not present

## 2011-08-04 DIAGNOSIS — M6281 Muscle weakness (generalized): Secondary | ICD-10-CM | POA: Diagnosis not present

## 2011-08-04 DIAGNOSIS — R279 Unspecified lack of coordination: Secondary | ICD-10-CM | POA: Diagnosis not present

## 2011-08-05 ENCOUNTER — Ambulatory Visit (INDEPENDENT_AMBULATORY_CARE_PROVIDER_SITE_OTHER): Payer: Medicare Other

## 2011-08-05 ENCOUNTER — Ambulatory Visit (INDEPENDENT_AMBULATORY_CARE_PROVIDER_SITE_OTHER): Payer: Medicare Other | Admitting: Critical Care Medicine

## 2011-08-05 ENCOUNTER — Encounter: Payer: Self-pay | Admitting: Critical Care Medicine

## 2011-08-05 VITALS — BP 104/56 | HR 72 | Temp 98.8°F | Ht 63.5 in | Wt 140.8 lb

## 2011-08-05 DIAGNOSIS — M6281 Muscle weakness (generalized): Secondary | ICD-10-CM | POA: Diagnosis not present

## 2011-08-05 DIAGNOSIS — J45909 Unspecified asthma, uncomplicated: Secondary | ICD-10-CM

## 2011-08-05 DIAGNOSIS — S72009A Fracture of unspecified part of neck of unspecified femur, initial encounter for closed fracture: Secondary | ICD-10-CM | POA: Diagnosis not present

## 2011-08-05 DIAGNOSIS — M2550 Pain in unspecified joint: Secondary | ICD-10-CM | POA: Diagnosis not present

## 2011-08-05 DIAGNOSIS — S72001A Fracture of unspecified part of neck of right femur, initial encounter for closed fracture: Secondary | ICD-10-CM

## 2011-08-05 DIAGNOSIS — R279 Unspecified lack of coordination: Secondary | ICD-10-CM | POA: Diagnosis not present

## 2011-08-05 DIAGNOSIS — R269 Unspecified abnormalities of gait and mobility: Secondary | ICD-10-CM | POA: Diagnosis not present

## 2011-08-05 NOTE — Progress Notes (Signed)
Subjective:    Patient ID: [name], female    DOB: 02-Apr-1924, 78 y.o.   MRN: [id number]  HPI  This is a 78 y.o.  white female, history of asthmatic bronchitis, severe, persistent. Severe Atopy on Xolair.     11/7 Noting more cough and congestion,  Worse since sept.  Mucus is yellow, notes pndrip Asthma History: Symptoms Daily Nighttime Awakenings 0-2/month Asthma interference with normal activity Minor limitations SABA use (not for EIB) Daily Risk: Exacerbations requiring oral systemic steroids 0-1 / year  08/05/11 Had a fall and repaired the R hip, hip fracture .  This was end of 11/12.  Pt now in rehab. Had bronchitis one week ago. Rx pred and ABX at [name]  Since Rx is better, has a slight wheeze.  Plans to d/c home on Friday.   Now on a walker at [facility].  No real chest pain.  Now no real mucus.    Past Medical History  Diagnosis Date  . Asthma   . Hypertension   . Arthritis   . Depression   . Hyperlipidemia      Family History  Problem Relation Age of Onset  . Asthma    . Colon cancer Father      History   Social History  . Marital Status: Married    Spouse Name: N/A    Number of Children: N/A  . Years of Education: N/A   Occupational History  . retired    Social History Main Topics  . Smoking status: Never Smoker   . Smokeless tobacco: Never Used  . Alcohol Use: Yes  . Drug Use: No  . Sexually Active: Not on file   Other Topics Concern  . Not on file   Social History Narrative  . No narrative on file     Allergies  Allergen Reactions  . Aspirin   . Sulfonamide Derivatives      Outpatient Prescriptions Prior to Visit  Medication Sig Dispense Refill  . acetaminophen (TYLENOL) 500 MG tablet Take 500 mg by mouth every 4 (four) hours as needed.        Marland Kitchen ADVAIR DISKUS 250-50 MCG/DOSE AEPB Take 1 puff by mouth 2 (two) times daily.  60 each  6  . albuterol (PROVENTIL HFA;VENTOLIN HFA) 108 (90 BASE) MCG/ACT inhaler Inhale 2 puffs into the  lungs every 6 (six) hours as needed.        Marland Kitchen amLODipine (NORVASC) 5 MG tablet Take 5 mg by mouth daily.        . calcium-vitamin D (OSCAL WITH D) 500-200 MG-UNIT per tablet Take 1 tablet by mouth daily.        . cetirizine (ZYRTEC) 10 MG tablet Take 1 tablet (10 mg total) by mouth daily.  30 tablet  6  . FLUoxetine (PROZAC) 20 MG capsule Take 40 mg by mouth daily.       . fluticasone (FLONASE) 50 MCG/ACT nasal spray Place 2 sprays into the nose daily.  16 g  6  . hydrochlorothiazide 25 MG tablet Take 25 mg by mouth daily.        . montelukast (SINGULAIR) 10 MG tablet Take 1 tablet (10 mg total) by mouth daily.  30 tablet  6  . Multiple Vitamin (MULTIVITAMIN) tablet Take 1 tablet by mouth daily.        Marland Kitchen olmesartan (BENICAR) 40 MG tablet Take 40 mg by mouth daily.        Marland Kitchen omalizumab (XOLAIR) 150 MG injection  300 mg every 2 weeks       . predniSONE (DELTASONE) 10 MG tablet Take 4 for two days three for two days two for two days one for two days  20 tablet  0  . rivaroxaban (XARELTO) 10 MG TABS tablet Take 1 tablet (10 mg total) by mouth daily.  12 tablet  0     Review of Systems  Constitutional:   No  weight loss, night sweats,  Fevers, chills, fatigue, lassitude. HEENT:   No headaches,  Difficulty swallowing,  Tooth/dental problems,  Sore throat,                No sneezing, itching, ear ache, nasal congestion, post nasal drip,   CV:  No chest pain,  Orthopnea, PND, swelling in lower extremities, anasarca, dizziness, palpitations  GI  No heartburn, indigestion, abdominal pain, nausea, vomiting, diarrhea, change in bowel habits, loss of appetite  Resp: Notes  shortness of breath with exertion not at rest.  No excess mucus, no  productive cough,  No non-productive cough,  No coughing up of blood.  No change in color of mucus.  No wheezing.  No chest wall deformity  Skin: no rash or lesions.  GU: no dysuria, change in color of urine, no urgency or frequency.  No flank pain.  MS:  No  joint pain or swelling.  No decreased range of motion.  No back pain.  Psych:  No change in mood or affect. No depression or anxiety.  No memory loss.     Objective:   Physical Exam   Filed Vitals:   08/05/11 1104  BP: 104/56  Pulse: 72  Temp: 98.8 F (37.1 C)  TempSrc: Oral  Height: 5' 3.5" (1.613 m)  Weight: 140 lb 12.8 oz (63.866 kg)  SpO2: 97%    Gen: Pleasant, well-nourished, in no distress,  normal affect sitting in wheelchair  ENT: No lesions,  mouth clear,  oropharynx clear, no postnasal drip  Neck: No JVD, no TMG, no carotid bruits  Lungs: No use of accessory muscles, no dullness to percussion, clear without rales or rhonchi  Cardiovascular: RRR, heart sounds normal, no murmur or gallops, no peripheral edema  Abdomen: soft and NT, no HSM,  BS normal  Musculoskeletal: No deformities, no cyanosis or clubbing  Neuro: alert, non focal  Skin: Warm, no lesions or rashes       Assessment & Plan:   Extrinsic asthma, unspecified Recent hip arthroplasty d/t fall and hip fracture at fem neck s/p prolonged rehab Pulm status with this event now stable Recent tracheobronchitis during rehab now better Plan Finish current pred pulse No further ABX needed Cont inhaled meds as Rx     Updated Medication List Outpatient Encounter Prescriptions as of 08/05/2011  Medication Sig Dispense Refill  . acetaminophen (TYLENOL) 500 MG tablet Take 500 mg by mouth every 4 (four) hours as needed.        Marland Kitchen ADVAIR DISKUS 250-50 MCG/DOSE AEPB Take 1 puff by mouth 2 (two) times daily.  60 each  6  . albuterol (PROVENTIL HFA;VENTOLIN HFA) 108 (90 BASE) MCG/ACT inhaler Inhale 2 puffs into the lungs every 6 (six) hours as needed.        Marland Kitchen amLODipine (NORVASC) 5 MG tablet Take 5 mg by mouth daily.        . calcium-vitamin D (OSCAL WITH D) 500-200 MG-UNIT per tablet Take 1 tablet by mouth daily.        . cetirizine (ZYRTEC) 10  MG tablet Take 1 tablet (10 mg total) by mouth daily.  30  tablet  6  . FLUoxetine (PROZAC) 20 MG capsule Take 40 mg by mouth daily.       . fluticasone (FLONASE) 50 MCG/ACT nasal spray Place 2 sprays into the nose daily.  16 g  6  . hydrochlorothiazide 25 MG tablet Take 25 mg by mouth daily.        . montelukast (SINGULAIR) 10 MG tablet Take 1 tablet (10 mg total) by mouth daily.  30 tablet  6  . Multiple Vitamin (MULTIVITAMIN) tablet Take 1 tablet by mouth daily.        Marland Kitchen olmesartan (BENICAR) 40 MG tablet Take 40 mg by mouth daily.        Marland Kitchen omalizumab (XOLAIR) 150 MG injection 300 mg every 2 weeks       . predniSONE (DELTASONE) 10 MG tablet Take 4 for two days three for two days two for two days one for two days  20 tablet  0  . rivaroxaban (XARELTO) 10 MG TABS tablet Take 1 tablet (10 mg total) by mouth daily.  12 tablet  0

## 2011-08-05 NOTE — Patient Instructions (Signed)
No new changes , finish prednisone. No change in medications Return two months

## 2011-08-06 DIAGNOSIS — R269 Unspecified abnormalities of gait and mobility: Secondary | ICD-10-CM | POA: Diagnosis not present

## 2011-08-06 DIAGNOSIS — M6281 Muscle weakness (generalized): Secondary | ICD-10-CM | POA: Diagnosis not present

## 2011-08-06 DIAGNOSIS — R279 Unspecified lack of coordination: Secondary | ICD-10-CM | POA: Diagnosis not present

## 2011-08-06 DIAGNOSIS — M2550 Pain in unspecified joint: Secondary | ICD-10-CM | POA: Diagnosis not present

## 2011-08-06 NOTE — Assessment & Plan Note (Signed)
Recent hip arthroplasty d/t fall and hip fracture at fem neck s/p prolonged rehab Pulm status with this event now stable Recent tracheobronchitis during rehab now better Plan Finish current pred pulse No further ABX needed Cont inhaled meds as Rx

## 2011-08-07 ENCOUNTER — Ambulatory Visit: Payer: Medicare Other

## 2011-08-07 DIAGNOSIS — M6281 Muscle weakness (generalized): Secondary | ICD-10-CM | POA: Diagnosis not present

## 2011-08-07 DIAGNOSIS — J45909 Unspecified asthma, uncomplicated: Secondary | ICD-10-CM

## 2011-08-07 DIAGNOSIS — S72009A Fracture of unspecified part of neck of unspecified femur, initial encounter for closed fracture: Secondary | ICD-10-CM | POA: Diagnosis not present

## 2011-08-07 DIAGNOSIS — R269 Unspecified abnormalities of gait and mobility: Secondary | ICD-10-CM | POA: Diagnosis not present

## 2011-08-07 DIAGNOSIS — D62 Acute posthemorrhagic anemia: Secondary | ICD-10-CM | POA: Diagnosis not present

## 2011-08-07 DIAGNOSIS — M2550 Pain in unspecified joint: Secondary | ICD-10-CM | POA: Diagnosis not present

## 2011-08-07 DIAGNOSIS — R279 Unspecified lack of coordination: Secondary | ICD-10-CM | POA: Diagnosis not present

## 2011-08-07 MED ORDER — OMALIZUMAB 150 MG ~~LOC~~ SOLR
300.0000 mg | Freq: Once | SUBCUTANEOUS | Status: AC
  Administered 2011-08-07: 300 mg via SUBCUTANEOUS

## 2011-08-10 DIAGNOSIS — R269 Unspecified abnormalities of gait and mobility: Secondary | ICD-10-CM | POA: Diagnosis not present

## 2011-08-10 DIAGNOSIS — M2550 Pain in unspecified joint: Secondary | ICD-10-CM | POA: Diagnosis not present

## 2011-08-10 DIAGNOSIS — M6281 Muscle weakness (generalized): Secondary | ICD-10-CM | POA: Diagnosis not present

## 2011-08-10 DIAGNOSIS — R279 Unspecified lack of coordination: Secondary | ICD-10-CM | POA: Diagnosis not present

## 2011-08-13 DIAGNOSIS — M2550 Pain in unspecified joint: Secondary | ICD-10-CM | POA: Diagnosis not present

## 2011-08-13 DIAGNOSIS — R269 Unspecified abnormalities of gait and mobility: Secondary | ICD-10-CM | POA: Diagnosis not present

## 2011-08-13 DIAGNOSIS — R279 Unspecified lack of coordination: Secondary | ICD-10-CM | POA: Diagnosis not present

## 2011-08-13 DIAGNOSIS — M6281 Muscle weakness (generalized): Secondary | ICD-10-CM | POA: Diagnosis not present

## 2011-08-17 DIAGNOSIS — R279 Unspecified lack of coordination: Secondary | ICD-10-CM | POA: Diagnosis not present

## 2011-08-17 DIAGNOSIS — M2550 Pain in unspecified joint: Secondary | ICD-10-CM | POA: Diagnosis not present

## 2011-08-17 DIAGNOSIS — M1710 Unilateral primary osteoarthritis, unspecified knee: Secondary | ICD-10-CM | POA: Diagnosis not present

## 2011-08-17 DIAGNOSIS — M6281 Muscle weakness (generalized): Secondary | ICD-10-CM | POA: Diagnosis not present

## 2011-08-17 DIAGNOSIS — R269 Unspecified abnormalities of gait and mobility: Secondary | ICD-10-CM | POA: Diagnosis not present

## 2011-08-17 DIAGNOSIS — IMO0002 Reserved for concepts with insufficient information to code with codable children: Secondary | ICD-10-CM | POA: Diagnosis not present

## 2011-08-19 DIAGNOSIS — R269 Unspecified abnormalities of gait and mobility: Secondary | ICD-10-CM | POA: Diagnosis not present

## 2011-08-19 DIAGNOSIS — M2550 Pain in unspecified joint: Secondary | ICD-10-CM | POA: Diagnosis not present

## 2011-08-19 DIAGNOSIS — R279 Unspecified lack of coordination: Secondary | ICD-10-CM | POA: Diagnosis not present

## 2011-08-19 DIAGNOSIS — M6281 Muscle weakness (generalized): Secondary | ICD-10-CM | POA: Diagnosis not present

## 2011-08-20 ENCOUNTER — Ambulatory Visit (INDEPENDENT_AMBULATORY_CARE_PROVIDER_SITE_OTHER): Payer: Medicare Other

## 2011-08-20 DIAGNOSIS — J45909 Unspecified asthma, uncomplicated: Secondary | ICD-10-CM

## 2011-08-21 DIAGNOSIS — J45909 Unspecified asthma, uncomplicated: Secondary | ICD-10-CM | POA: Diagnosis not present

## 2011-08-21 MED ORDER — OMALIZUMAB 150 MG ~~LOC~~ SOLR
300.0000 mg | Freq: Once | SUBCUTANEOUS | Status: AC
  Administered 2011-08-21: 300 mg via SUBCUTANEOUS

## 2011-08-27 ENCOUNTER — Other Ambulatory Visit: Payer: Self-pay | Admitting: Dermatology

## 2011-08-27 DIAGNOSIS — C44721 Squamous cell carcinoma of skin of unspecified lower limb, including hip: Secondary | ICD-10-CM | POA: Diagnosis not present

## 2011-08-27 DIAGNOSIS — D485 Neoplasm of uncertain behavior of skin: Secondary | ICD-10-CM | POA: Diagnosis not present

## 2011-09-03 ENCOUNTER — Ambulatory Visit (INDEPENDENT_AMBULATORY_CARE_PROVIDER_SITE_OTHER): Payer: Medicare Other

## 2011-09-03 DIAGNOSIS — J45909 Unspecified asthma, uncomplicated: Secondary | ICD-10-CM

## 2011-09-03 MED ORDER — OMALIZUMAB 150 MG ~~LOC~~ SOLR
300.0000 mg | Freq: Once | SUBCUTANEOUS | Status: AC
  Administered 2011-09-03: 300 mg via SUBCUTANEOUS

## 2011-09-04 DIAGNOSIS — R279 Unspecified lack of coordination: Secondary | ICD-10-CM | POA: Diagnosis not present

## 2011-09-04 DIAGNOSIS — R269 Unspecified abnormalities of gait and mobility: Secondary | ICD-10-CM | POA: Diagnosis not present

## 2011-09-04 DIAGNOSIS — M6281 Muscle weakness (generalized): Secondary | ICD-10-CM | POA: Diagnosis not present

## 2011-09-07 DIAGNOSIS — M6281 Muscle weakness (generalized): Secondary | ICD-10-CM | POA: Diagnosis not present

## 2011-09-07 DIAGNOSIS — R269 Unspecified abnormalities of gait and mobility: Secondary | ICD-10-CM | POA: Diagnosis not present

## 2011-09-07 DIAGNOSIS — R279 Unspecified lack of coordination: Secondary | ICD-10-CM | POA: Diagnosis not present

## 2011-09-09 DIAGNOSIS — R269 Unspecified abnormalities of gait and mobility: Secondary | ICD-10-CM | POA: Diagnosis not present

## 2011-09-09 DIAGNOSIS — M6281 Muscle weakness (generalized): Secondary | ICD-10-CM | POA: Diagnosis not present

## 2011-09-09 DIAGNOSIS — R279 Unspecified lack of coordination: Secondary | ICD-10-CM | POA: Diagnosis not present

## 2011-09-11 ENCOUNTER — Encounter: Payer: Self-pay | Admitting: Internal Medicine

## 2011-09-11 DIAGNOSIS — R279 Unspecified lack of coordination: Secondary | ICD-10-CM | POA: Diagnosis not present

## 2011-09-11 DIAGNOSIS — M6281 Muscle weakness (generalized): Secondary | ICD-10-CM | POA: Diagnosis not present

## 2011-09-11 DIAGNOSIS — R269 Unspecified abnormalities of gait and mobility: Secondary | ICD-10-CM | POA: Diagnosis not present

## 2011-09-14 DIAGNOSIS — R269 Unspecified abnormalities of gait and mobility: Secondary | ICD-10-CM | POA: Diagnosis not present

## 2011-09-14 DIAGNOSIS — M6281 Muscle weakness (generalized): Secondary | ICD-10-CM | POA: Diagnosis not present

## 2011-09-14 DIAGNOSIS — R279 Unspecified lack of coordination: Secondary | ICD-10-CM | POA: Diagnosis not present

## 2011-09-17 ENCOUNTER — Ambulatory Visit (INDEPENDENT_AMBULATORY_CARE_PROVIDER_SITE_OTHER): Payer: Medicare Other

## 2011-09-17 DIAGNOSIS — J45909 Unspecified asthma, uncomplicated: Secondary | ICD-10-CM

## 2011-09-17 MED ORDER — OMALIZUMAB 150 MG ~~LOC~~ SOLR
300.0000 mg | Freq: Once | SUBCUTANEOUS | Status: AC
  Administered 2011-09-17: 300 mg via SUBCUTANEOUS

## 2011-09-21 DIAGNOSIS — M1710 Unilateral primary osteoarthritis, unspecified knee: Secondary | ICD-10-CM | POA: Diagnosis not present

## 2011-09-21 DIAGNOSIS — IMO0002 Reserved for concepts with insufficient information to code with codable children: Secondary | ICD-10-CM | POA: Diagnosis not present

## 2011-09-25 DIAGNOSIS — F329 Major depressive disorder, single episode, unspecified: Secondary | ICD-10-CM | POA: Diagnosis not present

## 2011-09-25 DIAGNOSIS — J45909 Unspecified asthma, uncomplicated: Secondary | ICD-10-CM | POA: Diagnosis not present

## 2011-09-25 DIAGNOSIS — M899 Disorder of bone, unspecified: Secondary | ICD-10-CM | POA: Diagnosis not present

## 2011-09-25 DIAGNOSIS — I10 Essential (primary) hypertension: Secondary | ICD-10-CM | POA: Diagnosis not present

## 2011-09-25 DIAGNOSIS — F3289 Other specified depressive episodes: Secondary | ICD-10-CM | POA: Diagnosis not present

## 2011-09-25 DIAGNOSIS — M949 Disorder of cartilage, unspecified: Secondary | ICD-10-CM | POA: Diagnosis not present

## 2011-09-28 DIAGNOSIS — IMO0002 Reserved for concepts with insufficient information to code with codable children: Secondary | ICD-10-CM | POA: Diagnosis not present

## 2011-09-28 DIAGNOSIS — M1710 Unilateral primary osteoarthritis, unspecified knee: Secondary | ICD-10-CM | POA: Diagnosis not present

## 2011-10-01 ENCOUNTER — Ambulatory Visit (INDEPENDENT_AMBULATORY_CARE_PROVIDER_SITE_OTHER): Payer: Medicare Other

## 2011-10-01 DIAGNOSIS — J45909 Unspecified asthma, uncomplicated: Secondary | ICD-10-CM

## 2011-10-02 DIAGNOSIS — J45909 Unspecified asthma, uncomplicated: Secondary | ICD-10-CM

## 2011-10-02 MED ORDER — OMALIZUMAB 150 MG ~~LOC~~ SOLR
300.0000 mg | Freq: Once | SUBCUTANEOUS | Status: AC
  Administered 2011-10-02: 300 mg via SUBCUTANEOUS

## 2011-10-05 DIAGNOSIS — IMO0002 Reserved for concepts with insufficient information to code with codable children: Secondary | ICD-10-CM | POA: Diagnosis not present

## 2011-10-05 DIAGNOSIS — M1710 Unilateral primary osteoarthritis, unspecified knee: Secondary | ICD-10-CM | POA: Diagnosis not present

## 2011-10-15 ENCOUNTER — Ambulatory Visit (INDEPENDENT_AMBULATORY_CARE_PROVIDER_SITE_OTHER): Payer: Medicare Other

## 2011-10-15 DIAGNOSIS — J45909 Unspecified asthma, uncomplicated: Secondary | ICD-10-CM | POA: Diagnosis not present

## 2011-10-15 MED ORDER — OMALIZUMAB 150 MG ~~LOC~~ SOLR
300.0000 mg | Freq: Once | SUBCUTANEOUS | Status: AC
  Administered 2011-10-15: 300 mg via SUBCUTANEOUS

## 2011-10-23 ENCOUNTER — Encounter: Payer: Self-pay | Admitting: Critical Care Medicine

## 2011-10-23 ENCOUNTER — Ambulatory Visit (INDEPENDENT_AMBULATORY_CARE_PROVIDER_SITE_OTHER): Payer: Medicare Other | Admitting: Critical Care Medicine

## 2011-10-23 VITALS — BP 130/58 | HR 70 | Temp 97.3°F | Ht 60.0 in | Wt 139.0 lb

## 2011-10-23 DIAGNOSIS — J45909 Unspecified asthma, uncomplicated: Secondary | ICD-10-CM | POA: Diagnosis not present

## 2011-10-23 NOTE — Progress Notes (Signed)
Subjective:    Patient ID: [name], female    DOB: 09/08/1923, 78 y.o.   MRN: [id number]  HPI  This is a 78 y.o.  white female, history of asthmatic bronchitis, severe, persistent. Severe Atopy on Xolair.      10/23/2011 Now home from rehab. Pt doing well.  Walking better. Not using a walker Pt denies any significant sore throat, nasal congestion or excess secretions, fever, chills, sweats, unintended weight loss, pleurtic or exertional chest pain, orthopnea PND, or leg swelling Pt denies any increase in rescue therapy over baseline, denies waking up needing it or having any early am or nocturnal exacerbations of coughing/wheezing/or dyspnea. Pt also denies any obvious fluctuation in symptoms with  weather or environmental change or other alleviating or aggravating factors   PUL ASTHMA HISTORY 10/23/2011 06/10/2011 03/30/2011  Symptoms 0-2 days/week Daily 0-2 days/week  Nighttime awakenings 0-2/month 0-2/month 0-2/month  Interference with activity Minor limitations Minor limitations No limitations  SABA use 0-2 days/wk Daily 0-2 days/wk  Exacerbations requiring oral steroids 0-1 / year 0-1 / year 0-1 / year    Past Medical History  Diagnosis Date  . Asthma   . Hypertension   . Arthritis   . Depression   . Hyperlipidemia      Family History  Problem Relation Age of Onset  . Asthma    . Colon cancer Father      History   Social History  . Marital Status: Married    Spouse Name: N/A    Number of Children: N/A  . Years of Education: N/A   Occupational History  . retired    Social History Main Topics  . Smoking status: Never Smoker   . Smokeless tobacco: Never Used  . Alcohol Use: Yes  . Drug Use: No  . Sexually Active: Not on file   Other Topics Concern  . Not on file   Social History Narrative  . No narrative on file     Allergies  Allergen Reactions  . Aspirin   . Sulfonamide Derivatives      Outpatient Prescriptions Prior to Visit  Medication  Sig Dispense Refill  . acetaminophen (TYLENOL) 500 MG tablet Take 500 mg by mouth every 4 (four) hours as needed.        Marland Kitchen ADVAIR DISKUS 250-50 MCG/DOSE AEPB Take 1 puff by mouth 2 (two) times daily.  60 each  6  . albuterol (PROVENTIL HFA;VENTOLIN HFA) 108 (90 BASE) MCG/ACT inhaler Inhale 2 puffs into the lungs every 6 (six) hours as needed.        Marland Kitchen amLODipine (NORVASC) 5 MG tablet Take 5 mg by mouth daily.        . calcium-vitamin D (OSCAL WITH D) 500-200 MG-UNIT per tablet Take 1 tablet by mouth daily.        . cetirizine (ZYRTEC) 10 MG tablet Take 1 tablet (10 mg total) by mouth daily.  30 tablet  6  . FLUoxetine (PROZAC) 20 MG capsule Take 40 mg by mouth daily.       . fluticasone (FLONASE) 50 MCG/ACT nasal spray Place 2 sprays into the nose daily.  16 g  6  . hydrochlorothiazide 25 MG tablet Take 25 mg by mouth daily.        . montelukast (SINGULAIR) 10 MG tablet Take 1 tablet (10 mg total) by mouth daily.  30 tablet  6  . Multiple Vitamin (MULTIVITAMIN) tablet Take 1 tablet by mouth daily.        Marland Kitchen  olmesartan (BENICAR) 40 MG tablet Take 40 mg by mouth daily.        Marland Kitchen omalizumab (XOLAIR) 150 MG injection 300 mg every 2 weeks       . predniSONE (DELTASONE) 10 MG tablet Take 4 for two days three for two days two for two days one for two days  20 tablet  0  . rivaroxaban (XARELTO) 10 MG TABS tablet Take 1 tablet (10 mg total) by mouth daily.  12 tablet  0     Review of Systems  Constitutional:   No  weight loss, night sweats,  Fevers, chills, fatigue, lassitude. HEENT:   No headaches,  Difficulty swallowing,  Tooth/dental problems,  Sore throat,                No sneezing, itching, ear ache, nasal congestion, post nasal drip,   CV:  No chest pain,  Orthopnea, PND, swelling in lower extremities, anasarca, dizziness, palpitations  GI  No heartburn, indigestion, abdominal pain, nausea, vomiting, diarrhea, change in bowel habits, loss of appetite  Resp: Notes  shortness of breath with  exertion not at rest.  No excess mucus, no  productive cough,  No non-productive cough,  No coughing up of blood.  No change in color of mucus.  No wheezing.  No chest wall deformity  Skin: no rash or lesions.  GU: no dysuria, change in color of urine, no urgency or frequency.  No flank pain.  MS:  No joint pain or swelling.  No decreased range of motion.  No back pain.  Psych:  No change in mood or affect. No depression or anxiety.  No memory loss.     Objective:   Physical Exam   Filed Vitals:   10/23/11 1000  BP: 130/58  Pulse: 70  Temp: 97.3 F (36.3 C)  TempSrc: Oral  Height: 5' (1.524 m)  Weight: 139 lb (63.05 kg)  SpO2: 97%    Gen: Pleasant, well-nourished, in no distress,  normal affect sitting in wheelchair  ENT: No lesions,  mouth clear,  oropharynx clear, no postnasal drip  Neck: No JVD, no TMG, no carotid bruits  Lungs: No use of accessory muscles, no dullness to percussion, clear without rales or rhonchi  Cardiovascular: RRR, heart sounds normal, no murmur or gallops, no peripheral edema  Abdomen: soft and NT, no HSM,  BS normal  Musculoskeletal: No deformities, no cyanosis or clubbing  Neuro: alert, non focal  Skin: Warm, no lesions or rash         Assessment & Plan:   Extrinsic asthma, unspecified Moderate persistent asthma stable at this time Plan Continued inhaled medications as prescribed Continue Xolair     Updated Medication List Outpatient Encounter Prescriptions as of 10/23/2011  Medication Sig Dispense Refill  . acetaminophen (TYLENOL) 500 MG tablet Take 500 mg by mouth every 4 (four) hours as needed.        Marland Kitchen ADVAIR DISKUS 250-50 MCG/DOSE AEPB Take 1 puff by mouth 2 (two) times daily.  60 each  6  . albuterol (PROVENTIL HFA;VENTOLIN HFA) 108 (90 BASE) MCG/ACT inhaler Inhale 2 puffs into the lungs every 6 (six) hours as needed.        Marland Kitchen amLODipine (NORVASC) 5 MG tablet Take 5 mg by mouth daily.        . calcium-vitamin D (OSCAL  WITH D) 500-200 MG-UNIT per tablet Take 1 tablet by mouth daily.        . cetirizine (ZYRTEC) 10 MG tablet  Take 1 tablet (10 mg total) by mouth daily.  30 tablet  6  . FLUoxetine (PROZAC) 20 MG capsule Take 40 mg by mouth daily.       . fluticasone (FLONASE) 50 MCG/ACT nasal spray Place 2 sprays into the nose daily.  16 g  6  . hydrochlorothiazide 25 MG tablet Take 25 mg by mouth daily.        . montelukast (SINGULAIR) 10 MG tablet Take 1 tablet (10 mg total) by mouth daily.  30 tablet  6  . Multiple Vitamin (MULTIVITAMIN) tablet Take 1 tablet by mouth daily.        Marland Kitchen olmesartan (BENICAR) 40 MG tablet Take 40 mg by mouth daily.        Marland Kitchen omalizumab (XOLAIR) 150 MG injection 300 mg every 2 weeks       . DISCONTD: predniSONE (DELTASONE) 10 MG tablet Take 4 for two days three for two days two for two days one for two days  20 tablet  0  . DISCONTD: rivaroxaban (XARELTO) 10 MG TABS tablet Take 1 tablet (10 mg total) by mouth daily.  12 tablet  0

## 2011-10-23 NOTE — Assessment & Plan Note (Signed)
Moderate persistent asthma stable at this time Plan Continued inhaled medications as prescribed Continue Xolair

## 2011-10-23 NOTE — Patient Instructions (Signed)
No change in medications. Return in         4 months 

## 2011-10-26 DIAGNOSIS — Z961 Presence of intraocular lens: Secondary | ICD-10-CM | POA: Diagnosis not present

## 2011-10-26 DIAGNOSIS — H04129 Dry eye syndrome of unspecified lacrimal gland: Secondary | ICD-10-CM | POA: Diagnosis not present

## 2011-10-26 DIAGNOSIS — D492 Neoplasm of unspecified behavior of bone, soft tissue, and skin: Secondary | ICD-10-CM | POA: Diagnosis not present

## 2011-10-26 DIAGNOSIS — H43819 Vitreous degeneration, unspecified eye: Secondary | ICD-10-CM | POA: Diagnosis not present

## 2011-10-29 ENCOUNTER — Ambulatory Visit (INDEPENDENT_AMBULATORY_CARE_PROVIDER_SITE_OTHER): Payer: Medicare Other

## 2011-10-29 DIAGNOSIS — J45909 Unspecified asthma, uncomplicated: Secondary | ICD-10-CM

## 2011-10-29 MED ORDER — OMALIZUMAB 150 MG ~~LOC~~ SOLR
300.0000 mg | Freq: Once | SUBCUTANEOUS | Status: AC
  Administered 2011-10-29: 300 mg via SUBCUTANEOUS

## 2011-11-12 ENCOUNTER — Ambulatory Visit (INDEPENDENT_AMBULATORY_CARE_PROVIDER_SITE_OTHER): Payer: Medicare Other

## 2011-11-12 ENCOUNTER — Ambulatory Visit: Payer: Medicare Other

## 2011-11-12 DIAGNOSIS — J45909 Unspecified asthma, uncomplicated: Secondary | ICD-10-CM

## 2011-11-13 DIAGNOSIS — J45909 Unspecified asthma, uncomplicated: Secondary | ICD-10-CM | POA: Diagnosis not present

## 2011-11-13 MED ORDER — OMALIZUMAB 150 MG ~~LOC~~ SOLR
300.0000 mg | Freq: Once | SUBCUTANEOUS | Status: AC
  Administered 2011-11-13: 300 mg via SUBCUTANEOUS

## 2011-11-26 ENCOUNTER — Ambulatory Visit (INDEPENDENT_AMBULATORY_CARE_PROVIDER_SITE_OTHER): Payer: Medicare Other

## 2011-11-26 DIAGNOSIS — J45909 Unspecified asthma, uncomplicated: Secondary | ICD-10-CM

## 2011-11-27 MED ORDER — OMALIZUMAB 150 MG ~~LOC~~ SOLR
300.0000 mg | Freq: Once | SUBCUTANEOUS | Status: AC
  Administered 2011-11-27: 300 mg via SUBCUTANEOUS

## 2011-12-10 ENCOUNTER — Ambulatory Visit (INDEPENDENT_AMBULATORY_CARE_PROVIDER_SITE_OTHER): Payer: Medicare Other

## 2011-12-10 DIAGNOSIS — J45909 Unspecified asthma, uncomplicated: Secondary | ICD-10-CM | POA: Diagnosis not present

## 2011-12-10 MED ORDER — OMALIZUMAB 150 MG ~~LOC~~ SOLR
300.0000 mg | Freq: Once | SUBCUTANEOUS | Status: AC
  Administered 2011-12-10: 300 mg via SUBCUTANEOUS

## 2011-12-17 DIAGNOSIS — Z1231 Encounter for screening mammogram for malignant neoplasm of breast: Secondary | ICD-10-CM | POA: Diagnosis not present

## 2011-12-21 ENCOUNTER — Other Ambulatory Visit: Payer: Self-pay | Admitting: Critical Care Medicine

## 2011-12-24 ENCOUNTER — Ambulatory Visit (INDEPENDENT_AMBULATORY_CARE_PROVIDER_SITE_OTHER): Payer: Medicare Other

## 2011-12-24 DIAGNOSIS — J45909 Unspecified asthma, uncomplicated: Secondary | ICD-10-CM

## 2011-12-24 DIAGNOSIS — R928 Other abnormal and inconclusive findings on diagnostic imaging of breast: Secondary | ICD-10-CM | POA: Diagnosis not present

## 2011-12-24 MED ORDER — OMALIZUMAB 150 MG ~~LOC~~ SOLR
300.0000 mg | Freq: Once | SUBCUTANEOUS | Status: AC
  Administered 2011-12-24: 300 mg via SUBCUTANEOUS

## 2012-01-07 ENCOUNTER — Ambulatory Visit (INDEPENDENT_AMBULATORY_CARE_PROVIDER_SITE_OTHER): Payer: Medicare Other

## 2012-01-07 DIAGNOSIS — J45909 Unspecified asthma, uncomplicated: Secondary | ICD-10-CM

## 2012-01-07 MED ORDER — OMALIZUMAB 150 MG ~~LOC~~ SOLR
300.0000 mg | Freq: Once | SUBCUTANEOUS | Status: AC
  Administered 2012-01-07: 300 mg via SUBCUTANEOUS

## 2012-01-11 ENCOUNTER — Other Ambulatory Visit: Payer: Self-pay | Admitting: Critical Care Medicine

## 2012-01-21 ENCOUNTER — Ambulatory Visit (INDEPENDENT_AMBULATORY_CARE_PROVIDER_SITE_OTHER): Payer: Medicare Other

## 2012-01-21 DIAGNOSIS — J45909 Unspecified asthma, uncomplicated: Secondary | ICD-10-CM

## 2012-01-21 MED ORDER — OMALIZUMAB 150 MG ~~LOC~~ SOLR
300.0000 mg | Freq: Once | SUBCUTANEOUS | Status: AC
  Administered 2012-01-21: 300 mg via SUBCUTANEOUS

## 2012-01-22 ENCOUNTER — Other Ambulatory Visit: Payer: Self-pay | Admitting: Critical Care Medicine

## 2012-01-22 MED ORDER — MONTELUKAST SODIUM 10 MG PO TABS: 10.0000 mg | ORAL_TABLET | Freq: Every day | ORAL | Status: DC

## 2012-01-22 NOTE — Telephone Encounter (Signed)
Received faxed refill for montelukast 10 mg from [facility] [address]. Rx sent

## 2012-02-03 ENCOUNTER — Ambulatory Visit (INDEPENDENT_AMBULATORY_CARE_PROVIDER_SITE_OTHER): Payer: Medicare Other

## 2012-02-03 DIAGNOSIS — J45909 Unspecified asthma, uncomplicated: Secondary | ICD-10-CM

## 2012-02-05 MED ORDER — OMALIZUMAB 150 MG ~~LOC~~ SOLR
300.0000 mg | Freq: Once | SUBCUTANEOUS | Status: AC
  Administered 2012-02-05: 300 mg via SUBCUTANEOUS

## 2012-02-18 ENCOUNTER — Ambulatory Visit (INDEPENDENT_AMBULATORY_CARE_PROVIDER_SITE_OTHER): Payer: Medicare Other

## 2012-02-18 DIAGNOSIS — J45909 Unspecified asthma, uncomplicated: Secondary | ICD-10-CM

## 2012-02-18 MED ORDER — OMALIZUMAB 150 MG ~~LOC~~ SOLR
300.0000 mg | Freq: Once | SUBCUTANEOUS | Status: AC
  Administered 2012-02-18: 300 mg via SUBCUTANEOUS

## 2012-02-24 ENCOUNTER — Ambulatory Visit (INDEPENDENT_AMBULATORY_CARE_PROVIDER_SITE_OTHER): Payer: Medicare Other | Admitting: Critical Care Medicine

## 2012-02-24 ENCOUNTER — Encounter: Payer: Self-pay | Admitting: Critical Care Medicine

## 2012-02-24 VITALS — BP 132/76 | HR 66 | Temp 97.8°F | Ht 63.5 in | Wt 136.6 lb

## 2012-02-24 DIAGNOSIS — J45909 Unspecified asthma, uncomplicated: Secondary | ICD-10-CM

## 2012-02-24 MED ORDER — MONTELUKAST SODIUM 10 MG PO TABS: 10.0000 mg | ORAL_TABLET | Freq: Every day | ORAL | Status: DC

## 2012-02-24 MED ORDER — FLUTICASONE-SALMETEROL 250-50 MCG/DOSE IN AEPB: INHALATION_SPRAY | RESPIRATORY_TRACT | Status: DC

## 2012-02-24 MED ORDER — FLUTICASONE PROPIONATE 50 MCG/ACT NA SUSP: NASAL | Status: DC

## 2012-02-24 MED ORDER — AZELASTINE-FLUTICASONE 137-50 MCG/ACT NA SUSP: 1.0000 | Freq: Two times a day (BID) | NASAL | Status: DC

## 2012-02-24 NOTE — Patient Instructions (Signed)
Dymista one puff each nostril twice daily Flonase to be held until out of dymista No other changes Return 4 months

## 2012-02-24 NOTE — Progress Notes (Signed)
Subjective:    Patient ID: [name], female    DOB: Jul 08, 1924, 78 y.o.   MRN: [id number]  HPI  This is a 78 y.o.  white female, history of asthmatic bronchitis, severe, persistent. Severe Atopy on Xolair.      3/22  Now home from rehab. Pt doing well.  Walking better. Not using a walker Pt denies any significant sore throat, nasal congestion or excess secretions, fever, chills, sweats, unintended weight loss, pleurtic or exertional chest pain, orthopnea PND, or leg swelling Pt denies any increase in rescue therapy over baseline, denies waking up needing it or having any early am or nocturnal exacerbations of coughing/wheezing/or dyspnea. Pt also denies any obvious fluctuation in symptoms with  weather or environmental change or other alleviating or aggravating factors  02/24/2012 Pt noted more cough and congestion for 6weeks and rx mucinex and helped.  Mucus now is sl yellow Pt notes some pndrip.  No real wheeze.  Not dyspneic.  No soreness in throat  PUL ASTHMA HISTORY 02/24/2012 10/23/2011 06/10/2011 03/30/2011  Symptoms >2 days/week 0-2 days/week Daily 0-2 days/week  Nighttime awakenings 0-2/month 0-2/month 0-2/month 0-2/month  Interference with activity No limitations Minor limitations Minor limitations No limitations  SABA use 0-2 days/wk 0-2 days/wk Daily 0-2 days/wk  Exacerbations requiring oral steroids 0-1 / year 0-1 / year 0-1 / year 0-1 / year    Past Medical History  Diagnosis Date  . Asthma   . Hypertension   . Arthritis   . Depression   . Hyperlipidemia      Family History  Problem Relation Age of Onset  . Asthma    . Colon cancer Father      History   Social History  . Marital Status: Married    Spouse Name: N/A    Number of Children: N/A  . Years of Education: N/A   Occupational History  . retired    Social History Main Topics  . Smoking status: Never Smoker   . Smokeless tobacco: Never Used  . Alcohol Use: Yes  . Drug Use: No  . Sexually  Active: Not on file   Other Topics Concern  . Not on file   Social History Narrative  . No narrative on file     Allergies  Allergen Reactions  . Aspirin   . Sulfonamide Derivatives      Outpatient Prescriptions Prior to Visit  Medication Sig Dispense Refill  . acetaminophen (TYLENOL) 500 MG tablet Take 500 mg by mouth every 4 (four) hours as needed.        Marland Kitchen albuterol (PROVENTIL HFA;VENTOLIN HFA) 108 (90 BASE) MCG/ACT inhaler Inhale 2 puffs into the lungs every 6 (six) hours as needed.        Marland Kitchen amLODipine (NORVASC) 5 MG tablet Take 5 mg by mouth daily.        . cetirizine (ZYRTEC) 10 MG tablet Take 1 tablet (10 mg total) by mouth daily.  30 tablet  6  . FLUoxetine (PROZAC) 20 MG capsule Take 40 mg by mouth daily.       . hydrochlorothiazide 25 MG tablet Take 25 mg by mouth daily.        . Multiple Vitamin (MULTIVITAMIN) tablet Take 1 tablet by mouth daily.        Marland Kitchen olmesartan (BENICAR) 40 MG tablet Take 40 mg by mouth daily.        Marland Kitchen omalizumab (XOLAIR) 150 MG injection 300 mg every 2 weeks       .  ADVAIR DISKUS 250-50 MCG/DOSE AEPB TAKE 1 PUFF BY MOUTH 2 (TWO)  TIMES DAILY.  60 each  2  . fluticasone (FLONASE) 50 MCG/ACT nasal spray Place 2 sprays into the nose daily.  16 g  6  . montelukast (SINGULAIR) 10 MG tablet Take 1 tablet (10 mg total) by mouth daily.  30 tablet  2  . calcium-vitamin D (OSCAL WITH D) 500-200 MG-UNIT per tablet Take 1 tablet by mouth daily.           Review of Systems  Constitutional:   No  weight loss, night sweats,  Fevers, chills, fatigue, lassitude. HEENT:   No headaches,  Difficulty swallowing,  Tooth/dental problems,  Sore throat,                No sneezing, itching, ear ache, nasal congestion, post nasal drip,   CV:  No chest pain,  Orthopnea, PND, swelling in lower extremities, anasarca, dizziness, palpitations  GI  No heartburn, indigestion, abdominal pain, nausea, vomiting, diarrhea, change in bowel habits, loss of appetite  Resp: Notes   shortness of breath with exertion not at rest.  No excess mucus, no  productive cough,  No non-productive cough,  No coughing up of blood.  No change in color of mucus.  No wheezing.  No chest wall deformity  Skin: no rash or lesions.  GU: no dysuria, change in color of urine, no urgency or frequency.  No flank pain.  MS:  No joint pain or swelling.  No decreased range of motion.  No back pain.  Psych:  No change in mood or affect. No depression or anxiety.  No memory loss.     Objective:   Physical Exam   Filed Vitals:   02/24/12 1020  BP: 132/76  Pulse: 66  Temp: 97.8 F (36.6 C)  TempSrc: Oral  Height: 5' 3.5" (1.613 m)  Weight: 136 lb 9.6 oz (61.961 kg)  SpO2: 99%    Gen: Pleasant, well-nourished, in no distress,  normal affect   ENT: No lesions,  mouth clear,  oropharynx clear, +++ postnasal drip, mild nasal inflammation without purulence  Neck: No JVD, no TMG, no carotid bruits  Lungs: No use of accessory muscles, no dullness to percussion, clear without rales or rhonchi  Cardiovascular: RRR, heart sounds normal, no murmur or gallops, no peripheral edema  Abdomen: soft and NT, no HSM,  BS normal  Musculoskeletal: No deformities, no cyanosis or clubbing  Neuro: alert, non focal  Skin: Warm, no lesions or rash         Assessment & Plan:   Extrinsic asthma, unspecified Severe persistent asthma with allergic rhinitis and mild flare due to same Plan Hold Flonase Begin dymista  1 puff each nostril daily Continued inhaled medications as prescribed Continue Xolair as prescribed    Updated Medication List Outpatient Encounter Prescriptions as of 02/24/2012  Medication Sig Dispense Refill  . acetaminophen (TYLENOL) 500 MG tablet Take 500 mg by mouth every 4 (four) hours as needed.        Marland Kitchen albuterol (PROVENTIL HFA;VENTOLIN HFA) 108 (90 BASE) MCG/ACT inhaler Inhale 2 puffs into the lungs every 6 (six) hours as needed.        Marland Kitchen amLODipine (NORVASC) 5 MG  tablet Take 5 mg by mouth daily.        . cetirizine (ZYRTEC) 10 MG tablet Take 1 tablet (10 mg total) by mouth daily.  30 tablet  6  . FLUoxetine (PROZAC) 20 MG capsule Take 40  mg by mouth daily.       . fluticasone (FLONASE) 50 MCG/ACT nasal spray HOLD while on Dymista then resume when dymista runs out  16 g  6  . Fluticasone-Salmeterol (ADVAIR DISKUS) 250-50 MCG/DOSE AEPB One puff twice daily  60 each  11  . hydrochlorothiazide 25 MG tablet Take 25 mg by mouth daily.        . montelukast (SINGULAIR) 10 MG tablet Take 1 tablet (10 mg total) by mouth daily.  60 tablet  11  . Multiple Vitamin (MULTIVITAMIN) tablet Take 1 tablet by mouth daily.        Marland Kitchen olmesartan (BENICAR) 40 MG tablet Take 40 mg by mouth daily.        Marland Kitchen omalizumab (XOLAIR) 150 MG injection 300 mg every 2 weeks       . DISCONTD: ADVAIR DISKUS 250-50 MCG/DOSE AEPB TAKE 1 PUFF BY MOUTH 2 (TWO)  TIMES DAILY.  60 each  2  . DISCONTD: fluticasone (FLONASE) 50 MCG/ACT nasal spray Place 2 sprays into the nose daily.  16 g  6  . DISCONTD: montelukast (SINGULAIR) 10 MG tablet Take 1 tablet (10 mg total) by mouth daily.  30 tablet  2  . Azelastine-Fluticasone (DYMISTA) 137-50 MCG/ACT SUSP Place 1 puff into the nose 2 (two) times daily.  7 g  1  . DISCONTD: calcium-vitamin D (OSCAL WITH D) 500-200 MG-UNIT per tablet Take 1 tablet by mouth daily.

## 2012-02-24 NOTE — Assessment & Plan Note (Signed)
Severe persistent asthma with allergic rhinitis and mild flare due to same Plan Hold Flonase Begin dymista  1 puff each nostril daily Continued inhaled medications as prescribed Continue Xolair as prescribed

## 2012-03-03 ENCOUNTER — Ambulatory Visit (INDEPENDENT_AMBULATORY_CARE_PROVIDER_SITE_OTHER): Payer: Medicare Other

## 2012-03-03 DIAGNOSIS — J45909 Unspecified asthma, uncomplicated: Secondary | ICD-10-CM | POA: Diagnosis not present

## 2012-03-03 MED ORDER — OMALIZUMAB 150 MG ~~LOC~~ SOLR
300.0000 mg | Freq: Once | SUBCUTANEOUS | Status: AC
  Administered 2012-03-03: 300 mg via SUBCUTANEOUS

## 2012-03-11 DIAGNOSIS — M1710 Unilateral primary osteoarthritis, unspecified knee: Secondary | ICD-10-CM | POA: Diagnosis not present

## 2012-03-11 DIAGNOSIS — IMO0002 Reserved for concepts with insufficient information to code with codable children: Secondary | ICD-10-CM | POA: Diagnosis not present

## 2012-03-17 ENCOUNTER — Ambulatory Visit (INDEPENDENT_AMBULATORY_CARE_PROVIDER_SITE_OTHER): Payer: Medicare Other

## 2012-03-17 DIAGNOSIS — J45909 Unspecified asthma, uncomplicated: Secondary | ICD-10-CM | POA: Diagnosis not present

## 2012-03-17 MED ORDER — OMALIZUMAB 150 MG ~~LOC~~ SOLR
300.0000 mg | Freq: Once | SUBCUTANEOUS | Status: AC
  Administered 2012-03-17: 300 mg via SUBCUTANEOUS

## 2012-03-31 ENCOUNTER — Ambulatory Visit (INDEPENDENT_AMBULATORY_CARE_PROVIDER_SITE_OTHER): Payer: Medicare Other

## 2012-03-31 DIAGNOSIS — J45909 Unspecified asthma, uncomplicated: Secondary | ICD-10-CM | POA: Diagnosis not present

## 2012-03-31 MED ORDER — OMALIZUMAB 150 MG ~~LOC~~ SOLR
300.0000 mg | Freq: Once | SUBCUTANEOUS | Status: AC
  Administered 2012-03-31: 300 mg via SUBCUTANEOUS

## 2012-04-06 DIAGNOSIS — M810 Age-related osteoporosis without current pathological fracture: Secondary | ICD-10-CM | POA: Diagnosis not present

## 2012-04-06 DIAGNOSIS — I10 Essential (primary) hypertension: Secondary | ICD-10-CM | POA: Diagnosis not present

## 2012-04-06 DIAGNOSIS — Z79899 Other long term (current) drug therapy: Secondary | ICD-10-CM | POA: Diagnosis not present

## 2012-04-06 DIAGNOSIS — R82998 Other abnormal findings in urine: Secondary | ICD-10-CM | POA: Diagnosis not present

## 2012-04-13 DIAGNOSIS — F329 Major depressive disorder, single episode, unspecified: Secondary | ICD-10-CM | POA: Diagnosis not present

## 2012-04-13 DIAGNOSIS — Z0000 Encounter for general adult medical examination without abnormal findings: Secondary | ICD-10-CM | POA: Diagnosis not present

## 2012-04-13 DIAGNOSIS — Z23 Encounter for immunization: Secondary | ICD-10-CM | POA: Diagnosis not present

## 2012-04-13 DIAGNOSIS — J45909 Unspecified asthma, uncomplicated: Secondary | ICD-10-CM | POA: Diagnosis not present

## 2012-04-13 DIAGNOSIS — F3289 Other specified depressive episodes: Secondary | ICD-10-CM | POA: Diagnosis not present

## 2012-04-13 DIAGNOSIS — I10 Essential (primary) hypertension: Secondary | ICD-10-CM | POA: Diagnosis not present

## 2012-04-14 ENCOUNTER — Ambulatory Visit (INDEPENDENT_AMBULATORY_CARE_PROVIDER_SITE_OTHER): Payer: Medicare Other

## 2012-04-14 DIAGNOSIS — J45909 Unspecified asthma, uncomplicated: Secondary | ICD-10-CM | POA: Diagnosis not present

## 2012-04-14 MED ORDER — OMALIZUMAB 150 MG ~~LOC~~ SOLR
300.0000 mg | Freq: Once | SUBCUTANEOUS | Status: AC
  Administered 2012-04-14: 300 mg via SUBCUTANEOUS

## 2012-04-28 ENCOUNTER — Ambulatory Visit (INDEPENDENT_AMBULATORY_CARE_PROVIDER_SITE_OTHER): Payer: Medicare Other

## 2012-04-28 DIAGNOSIS — J45909 Unspecified asthma, uncomplicated: Secondary | ICD-10-CM

## 2012-04-29 DIAGNOSIS — J45909 Unspecified asthma, uncomplicated: Secondary | ICD-10-CM | POA: Diagnosis not present

## 2012-04-29 MED ORDER — OMALIZUMAB 150 MG ~~LOC~~ SOLR
300.0000 mg | Freq: Once | SUBCUTANEOUS | Status: AC
  Administered 2012-04-29: 300 mg via SUBCUTANEOUS

## 2012-05-12 ENCOUNTER — Ambulatory Visit (INDEPENDENT_AMBULATORY_CARE_PROVIDER_SITE_OTHER): Payer: Medicare Other

## 2012-05-12 DIAGNOSIS — J45909 Unspecified asthma, uncomplicated: Secondary | ICD-10-CM

## 2012-05-13 MED ORDER — OMALIZUMAB 150 MG ~~LOC~~ SOLR
300.0000 mg | Freq: Once | SUBCUTANEOUS | Status: AC
  Administered 2012-05-13: 300 mg via SUBCUTANEOUS

## 2012-05-25 ENCOUNTER — Ambulatory Visit (INDEPENDENT_AMBULATORY_CARE_PROVIDER_SITE_OTHER): Payer: Medicare Other | Admitting: Critical Care Medicine

## 2012-05-25 ENCOUNTER — Ambulatory Visit: Payer: Medicare Other

## 2012-05-25 ENCOUNTER — Encounter: Payer: Self-pay | Admitting: Critical Care Medicine

## 2012-05-25 VITALS — BP 116/58 | HR 68 | Temp 99.3°F | Ht 63.5 in | Wt 137.6 lb

## 2012-05-25 DIAGNOSIS — J45909 Unspecified asthma, uncomplicated: Secondary | ICD-10-CM | POA: Diagnosis not present

## 2012-05-25 MED ORDER — PREDNISONE 10 MG PO TABS: ORAL_TABLET | ORAL | Status: DC

## 2012-05-25 MED ORDER — AMOXICILLIN-POT CLAVULANATE 875-125 MG PO TABS: 1.0000 | ORAL_TABLET | Freq: Two times a day (BID) | ORAL | Status: DC

## 2012-05-25 MED ORDER — CETIRIZINE HCL 10 MG PO TABS: ORAL_TABLET | ORAL | Status: DC

## 2012-05-25 NOTE — Assessment & Plan Note (Signed)
Moderate persistent asthma with sinusitis flare and allergic rhinitis flare Plan Hold zyrtec for one week Prednisone 10mg  Take 4 for two days three for two days two for two days one for two days Augmentin one twice daily for 7days No change in inhaled meds Return 6 weeks

## 2012-05-25 NOTE — Patient Instructions (Addendum)
Hold zyrtec for one week Prednisone 10mg  Take 4 for two days three for two days two for two days one for two days Augmentin one twice daily for 7days No change in inhaled meds Return 6 weeks

## 2012-05-25 NOTE — Progress Notes (Signed)
Subjective:    Patient ID: [name], female    DOB: 04-Nov-1923, 78 y.o.   MRN: [id number]  HPI  This is a 78 y.o.  white female, history of asthmatic bronchitis, severe, persistent. Severe Atopy on Xolair.    3/22  Now home from rehab. Pt doing well.  Walking better. Not using a walker Pt denies any significant sore throat, nasal congestion or excess secretions, fever, chills, sweats, unintended weight loss, pleurtic or exertional chest pain, orthopnea PND, or leg swelling Pt denies any increase in rescue therapy over baseline, denies waking up needing it or having any early am or nocturnal exacerbations of coughing/wheezing/or dyspnea. Pt also denies any obvious fluctuation in symptoms with  weather or environmental change or other alleviating or aggravating factors  02/24/2012 Pt noted more cough and congestion for 6weeks and rx mucinex and helped.  Mucus now is sl yellow Pt notes some pndrip.  No real wheeze.  Not dyspneic.  No soreness in throat  05/25/2012 Pt still with sinus pressure.  No real cough, did cough up some mucus yellow.  No real chest pain. Pt notes sneezing.  No edema in feet.  No heartburn or indigestion.  No real dyspnea.  Pt denies any significant sore throat, nasal congestion or excess secretions, fever, chills, sweats, unintended weight loss, pleurtic or exertional chest pain, orthopnea PND, or leg swelling Pt denies any increase in rescue therapy over baseline, denies waking up needing it or having any early am or nocturnal exacerbations of coughing/wheezing/or dyspnea. Pt also denies any obvious fluctuation in symptoms with  weather or environmental change or other alleviating or aggravating factors     PUL ASTHMA HISTORY 05/25/2012 02/24/2012 10/23/2011 06/10/2011 03/30/2011  Symptoms Daily >2 days/week 0-2 days/week Daily 0-2 days/week  Nighttime awakenings 0-2/month 0-2/month 0-2/month 0-2/month 0-2/month  Interference with activity Some limitations No  limitations Minor limitations Minor limitations No limitations  SABA use 0-2 days/wk 0-2 days/wk 0-2 days/wk Daily 0-2 days/wk  Exacerbations requiring oral steroids 0-1 / year 0-1 / year 0-1 / year 0-1 / year 0-1 / year    Past Medical History  Diagnosis Date  . Asthma   . Hypertension   . Arthritis   . Depression   . Hyperlipidemia      Family History  Problem Relation Age of Onset  . Asthma    . Colon cancer Father      History   Social History  . Marital Status: Married    Spouse Name: N/A    Number of Children: N/A  . Years of Education: N/A   Occupational History  . retired    Social History Main Topics  . Smoking status: Never Smoker   . Smokeless tobacco: Never Used  . Alcohol Use: Yes  . Drug Use: No  . Sexually Active: Not on file   Other Topics Concern  . Not on file   Social History Narrative  . No narrative on file     Allergies  Allergen Reactions  . Aspirin   . Sulfonamide Derivatives      Outpatient Prescriptions Prior to Visit  Medication Sig Dispense Refill  . acetaminophen (TYLENOL) 500 MG tablet Take 500 mg by mouth every 4 (four) hours as needed.        Marland Kitchen albuterol (PROVENTIL HFA;VENTOLIN HFA) 108 (90 BASE) MCG/ACT inhaler Inhale 2 puffs into the lungs every 6 (six) hours as needed.        Marland Kitchen amLODipine (NORVASC) 5 MG tablet  Take 5 mg by mouth daily.        Marland Kitchen FLUoxetine (PROZAC) 20 MG capsule Take 40 mg by mouth daily.       . Fluticasone-Salmeterol (ADVAIR DISKUS) 250-50 MCG/DOSE AEPB One puff twice daily  60 each  11  . hydrochlorothiazide 25 MG tablet Take 25 mg by mouth daily.        . montelukast (SINGULAIR) 10 MG tablet Take 1 tablet (10 mg total) by mouth daily.  60 tablet  11  . Multiple Vitamin (MULTIVITAMIN) tablet Take 1 tablet by mouth daily.        Marland Kitchen olmesartan (BENICAR) 40 MG tablet Take 40 mg by mouth daily.        Marland Kitchen omalizumab (XOLAIR) 150 MG injection 300 mg every 2 weeks       . cetirizine (ZYRTEC) 10 MG tablet Take  1 tablet (10 mg total) by mouth daily.  30 tablet  6  . fluticasone (FLONASE) 50 MCG/ACT nasal spray HOLD while on Dymista then resume when dymista runs out  16 g  6  . Azelastine-Fluticasone (DYMISTA) 137-50 MCG/ACT SUSP Place 1 puff into the nose 2 (two) times daily.  7 g  1     Review of Systems  Constitutional:   No  weight loss, night sweats,  Fevers, chills, fatigue, lassitude. HEENT:   No headaches,  Difficulty swallowing,  Tooth/dental problems,  Sore throat,                No sneezing, itching, ear ache, nasal congestion, post nasal drip,   CV:  No chest pain,  Orthopnea, PND, swelling in lower extremities, anasarca, dizziness, palpitations  GI  No heartburn, indigestion, abdominal pain, nausea, vomiting, diarrhea, change in bowel habits, loss of appetite  Resp: Notes  shortness of breath with exertion not at rest.  No excess mucus, no  productive cough,  No non-productive cough,  No coughing up of blood.  No change in color of mucus.  No wheezing.  No chest wall deformity  Skin: no rash or lesions.  GU: no dysuria, change in color of urine, no urgency or frequency.  No flank pain.  MS:  No joint pain or swelling.  No decreased range of motion.  No back pain.  Psych:  No change in mood or affect. No depression or anxiety.  No memory loss.     Objective:   Physical Exam   Filed Vitals:   05/25/12 0958  BP: 116/58  Pulse: 68  Temp: 99.3 F (37.4 C)  TempSrc: Oral  Height: 5' 3.5" (1.613 m)  Weight: 137 lb 9.6 oz (62.415 kg)  SpO2: 99%    Gen: Pleasant, well-nourished, in no distress,  normal affect   ENT: No lesions,  mouth clear,  oropharynx clear, +++ postnasal drip, mild nasal inflammation with R>L nasal  purulence  Neck: No JVD, no TMG, no carotid bruits  Lungs: No use of accessory muscles, no dullness to percussion, few exp wheezes  Cardiovascular: RRR, heart sounds normal, no murmur or gallops, no peripheral edema  Abdomen: soft and NT, no HSM,  BS  normal  Musculoskeletal: No deformities, no cyanosis or clubbing  Neuro: alert, non focal  Skin: Warm, no lesions or rash         Assessment & Plan:   Moderate persistent asthma, allergic triggers Moderate persistent asthma with sinusitis flare and allergic rhinitis flare Plan Hold zyrtec for one week Prednisone 10mg  Take 4 for two days three for two  days two for two days one for two days Augmentin one twice daily for 7days No change in inhaled meds Return 6 weeks     Updated Medication List Outpatient Encounter Prescriptions as of 05/25/2012  Medication Sig Dispense Refill  . acetaminophen (TYLENOL) 500 MG tablet Take 500 mg by mouth every 4 (four) hours as needed.        Marland Kitchen albuterol (PROVENTIL HFA;VENTOLIN HFA) 108 (90 BASE) MCG/ACT inhaler Inhale 2 puffs into the lungs every 6 (six) hours as needed.        Marland Kitchen amLODipine (NORVASC) 5 MG tablet Take 5 mg by mouth daily.        . cetirizine (ZYRTEC) 10 MG tablet HOLD for 7 days then resume  30 tablet  6  . FLUoxetine (PROZAC) 20 MG capsule Take 40 mg by mouth daily.       . fluticasone (FLONASE) 50 MCG/ACT nasal spray Place 1 spray into the nose 2 (two) times daily.      . Fluticasone-Salmeterol (ADVAIR DISKUS) 250-50 MCG/DOSE AEPB One puff twice daily  60 each  11  . hydrochlorothiazide 25 MG tablet Take 25 mg by mouth daily.        . montelukast (SINGULAIR) 10 MG tablet Take 1 tablet (10 mg total) by mouth daily.  60 tablet  11  . Multiple Vitamin (MULTIVITAMIN) tablet Take 1 tablet by mouth daily.        Marland Kitchen olmesartan (BENICAR) 40 MG tablet Take 40 mg by mouth daily.        Marland Kitchen omalizumab (XOLAIR) 150 MG injection 300 mg every 2 weeks       . DISCONTD: cetirizine (ZYRTEC) 10 MG tablet Take 1 tablet (10 mg total) by mouth daily.  30 tablet  6  . DISCONTD: fluticasone (FLONASE) 50 MCG/ACT nasal spray HOLD while on Dymista then resume when dymista runs out  16 g  6  . amoxicillin-clavulanate (AUGMENTIN) 875-125 MG per tablet  Take 1 tablet by mouth 2 (two) times daily.  14 tablet  0  . predniSONE (DELTASONE) 10 MG tablet Take 4 for two days three for two days two for two days one for two days  20 tablet  0  . DISCONTD: Azelastine-Fluticasone (DYMISTA) 137-50 MCG/ACT SUSP Place 1 puff into the nose 2 (two) times daily.  7 g  1

## 2012-05-26 ENCOUNTER — Ambulatory Visit (INDEPENDENT_AMBULATORY_CARE_PROVIDER_SITE_OTHER): Payer: Medicare Other

## 2012-05-26 ENCOUNTER — Ambulatory Visit: Payer: Medicare Other

## 2012-05-26 DIAGNOSIS — J45909 Unspecified asthma, uncomplicated: Secondary | ICD-10-CM

## 2012-05-26 MED ORDER — OMALIZUMAB 150 MG ~~LOC~~ SOLR: 300.0000 mg | Freq: Once | SUBCUTANEOUS | Status: DC

## 2012-06-01 ENCOUNTER — Other Ambulatory Visit: Payer: Self-pay | Admitting: Dermatology

## 2012-06-01 DIAGNOSIS — L821 Other seborrheic keratosis: Secondary | ICD-10-CM | POA: Diagnosis not present

## 2012-06-01 DIAGNOSIS — D2310 Other benign neoplasm of skin of unspecified eyelid, including canthus: Secondary | ICD-10-CM | POA: Diagnosis not present

## 2012-06-01 DIAGNOSIS — D1801 Hemangioma of skin and subcutaneous tissue: Secondary | ICD-10-CM | POA: Diagnosis not present

## 2012-06-01 DIAGNOSIS — D239 Other benign neoplasm of skin, unspecified: Secondary | ICD-10-CM | POA: Diagnosis not present

## 2012-06-01 DIAGNOSIS — L538 Other specified erythematous conditions: Secondary | ICD-10-CM | POA: Diagnosis not present

## 2012-06-01 DIAGNOSIS — C44721 Squamous cell carcinoma of skin of unspecified lower limb, including hip: Secondary | ICD-10-CM | POA: Diagnosis not present

## 2012-06-09 ENCOUNTER — Ambulatory Visit (INDEPENDENT_AMBULATORY_CARE_PROVIDER_SITE_OTHER): Payer: Medicare Other

## 2012-06-09 DIAGNOSIS — J45909 Unspecified asthma, uncomplicated: Secondary | ICD-10-CM

## 2012-06-10 MED ORDER — OMALIZUMAB 150 MG ~~LOC~~ SOLR
300.0000 mg | Freq: Once | SUBCUTANEOUS | Status: AC
  Administered 2012-06-10: 300 mg via SUBCUTANEOUS

## 2012-06-20 ENCOUNTER — Other Ambulatory Visit: Payer: Self-pay | Admitting: Dermatology

## 2012-06-20 DIAGNOSIS — C44721 Squamous cell carcinoma of skin of unspecified lower limb, including hip: Secondary | ICD-10-CM | POA: Diagnosis not present

## 2012-06-21 DIAGNOSIS — M1710 Unilateral primary osteoarthritis, unspecified knee: Secondary | ICD-10-CM | POA: Diagnosis not present

## 2012-06-21 DIAGNOSIS — IMO0002 Reserved for concepts with insufficient information to code with codable children: Secondary | ICD-10-CM | POA: Diagnosis not present

## 2012-06-23 ENCOUNTER — Ambulatory Visit: Payer: Medicare Other

## 2012-06-23 ENCOUNTER — Ambulatory Visit (INDEPENDENT_AMBULATORY_CARE_PROVIDER_SITE_OTHER): Payer: Medicare Other

## 2012-06-23 DIAGNOSIS — J45909 Unspecified asthma, uncomplicated: Secondary | ICD-10-CM

## 2012-06-23 MED ORDER — OMALIZUMAB 150 MG ~~LOC~~ SOLR
300.0000 mg | Freq: Once | SUBCUTANEOUS | Status: AC
  Administered 2012-06-23: 300 mg via SUBCUTANEOUS

## 2012-06-28 DIAGNOSIS — IMO0002 Reserved for concepts with insufficient information to code with codable children: Secondary | ICD-10-CM | POA: Diagnosis not present

## 2012-06-28 DIAGNOSIS — M1710 Unilateral primary osteoarthritis, unspecified knee: Secondary | ICD-10-CM | POA: Diagnosis not present

## 2012-07-02 ENCOUNTER — Other Ambulatory Visit: Payer: Self-pay | Admitting: Critical Care Medicine

## 2012-07-05 DIAGNOSIS — IMO0002 Reserved for concepts with insufficient information to code with codable children: Secondary | ICD-10-CM | POA: Diagnosis not present

## 2012-07-05 DIAGNOSIS — M1710 Unilateral primary osteoarthritis, unspecified knee: Secondary | ICD-10-CM | POA: Diagnosis not present

## 2012-07-06 ENCOUNTER — Ambulatory Visit (INDEPENDENT_AMBULATORY_CARE_PROVIDER_SITE_OTHER): Payer: Medicare Other | Admitting: Critical Care Medicine

## 2012-07-06 ENCOUNTER — Encounter: Payer: Self-pay | Admitting: Critical Care Medicine

## 2012-07-06 VITALS — BP 114/66 | HR 72 | Temp 98.4°F | Ht 63.5 in | Wt 138.0 lb

## 2012-07-06 DIAGNOSIS — J45909 Unspecified asthma, uncomplicated: Secondary | ICD-10-CM

## 2012-07-06 NOTE — Progress Notes (Signed)
Subjective:    Patient ID: [name], female    DOB: [birth year], 78 y.o.   MRN: [id number]  HPI  This is a 78 y.o.  white female, history of asthmatic bronchitis, severe, persistent. Severe Atopy on Xolair.    3/22  Now home from rehab. Pt doing well.  Walking better. Not using a walker Pt denies any significant sore throat, nasal congestion or excess secretions, fever, chills, sweats, unintended weight loss, pleurtic or exertional chest pain, orthopnea PND, or leg swelling Pt denies any increase in rescue therapy over baseline, denies waking up needing it or having any early am or nocturnal exacerbations of coughing/wheezing/or dyspnea. Pt also denies any obvious fluctuation in symptoms with  weather or environmental change or other alleviating or aggravating factors  02/24/2012 Pt noted more cough and congestion for 6weeks and rx mucinex and helped.  Mucus now is sl yellow Pt notes some pndrip.  No real wheeze.  Not dyspneic.  No soreness in throat  05/25/2012 Pt still with sinus pressure.  No real cough, did cough up some mucus yellow.  No real chest pain. Pt notes sneezing.  No edema in feet.  No heartburn or indigestion.  No real dyspnea.  Pt denies any significant sore throat, nasal congestion or excess secretions, fever, chills, sweats, unintended weight loss, pleurtic or exertional chest pain, orthopnea PND, or leg swelling Pt denies any increase in rescue therapy over baseline, denies waking up needing it or having any early am or nocturnal exacerbations of coughing/wheezing/or dyspnea. Pt also denies any obvious fluctuation in symptoms with  weather or environmental change or other alleviating or aggravating factors  07/06/2012    PUL ASTHMA HISTORY 07/06/2012 05/25/2012 02/24/2012 10/23/2011 06/10/2011  Symptoms 0-2 days/week Daily >2 days/week 0-2 days/week Daily  Nighttime awakenings 0-2/month 0-2/month 0-2/month 0-2/month 0-2/month  Interference with activity No  limitations Some limitations No limitations Minor limitations Minor limitations  SABA use 0-2 days/wk 0-2 days/wk 0-2 days/wk 0-2 days/wk Daily  Exacerbations requiring oral steroids 0-1 / year 0-1 / year 0-1 / year 0-1 / year 0-1 / year    Past Medical History  Diagnosis Date  . Asthma   . Hypertension   . Arthritis   . Depression   . Hyperlipidemia      Family History  Problem Relation Age of Onset  . Asthma    . Colon cancer Father      History   Social History  . Marital Status: Married    Spouse Name: N/A    Number of Children: N/A  . Years of Education: N/A   Occupational History  . retired    Social History Main Topics  . Smoking status: Never Smoker   . Smokeless tobacco: Never Used  . Alcohol Use: Yes  . Drug Use: No  . Sexually Active: Not on file   Other Topics Concern  . Not on file   Social History Narrative  . No narrative on file     Allergies  Allergen Reactions  . Aspirin   . Sulfonamide Derivatives      Outpatient Prescriptions Prior to Visit  Medication Sig Dispense Refill  . acetaminophen (TYLENOL) 500 MG tablet Take 500 mg by mouth every 4 (four) hours as needed.        Marland Kitchen albuterol (PROVENTIL HFA;VENTOLIN HFA) 108 (90 BASE) MCG/ACT inhaler Inhale 2 puffs into the lungs every 6 (six) hours as needed.        Marland Kitchen amLODipine (NORVASC) 5 MG  tablet Take 5 mg by mouth daily.        Marland Kitchen FLUoxetine (PROZAC) 20 MG capsule Take 40 mg by mouth daily.       . fluticasone (FLONASE) 50 MCG/ACT nasal spray Place 1 spray into the nose 2 (two) times daily.      . Fluticasone-Salmeterol (ADVAIR DISKUS) 250-50 MCG/DOSE AEPB One puff twice daily  60 each  11  . hydrochlorothiazide 25 MG tablet Take 25 mg by mouth daily.        . montelukast (SINGULAIR) 10 MG tablet Take 1 tablet (10 mg total) by mouth daily.  60 tablet  11  . Multiple Vitamin (MULTIVITAMIN) tablet Take 1 tablet by mouth daily.        Marland Kitchen olmesartan (BENICAR) 40 MG tablet Take 40 mg by mouth  daily.        Marland Kitchen omalizumab (XOLAIR) 150 MG injection 300 mg every 2 weeks       . cetirizine (ZYRTEC) 10 MG tablet HOLD for 7 days then resume  30 tablet  6  . fluticasone (FLONASE) 50 MCG/ACT nasal spray PLACE 2 SPRAYS INTO THE NOSE  DAILY.  16 g  5  . amoxicillin-clavulanate (AUGMENTIN) 875-125 MG per tablet Take 1 tablet by mouth 2 (two) times daily.  14 tablet  0  . predniSONE (DELTASONE) 10 MG tablet Take 4 for two days three for two days two for two days one for two days  20 tablet  0     Review of Systems  Constitutional:   No  weight loss, night sweats,  Fevers, chills, fatigue, lassitude. HEENT:   No headaches,  Difficulty swallowing,  Tooth/dental problems,  Sore throat,                No sneezing, itching, ear ache, nasal congestion, post nasal drip,   CV:  No chest pain,  Orthopnea, PND, swelling in lower extremities, anasarca, dizziness, palpitations  GI  No heartburn, indigestion, abdominal pain, nausea, vomiting, diarrhea, change in bowel habits, loss of appetite  Resp: Notes  shortness of breath with exertion not at rest.  No excess mucus, no  productive cough,  No non-productive cough,  No coughing up of blood.  No change in color of mucus.  No wheezing.  No chest wall deformity  Skin: no rash or lesions.  GU: no dysuria, change in color of urine, no urgency or frequency.  No flank pain.  MS:  No joint pain or swelling.  No decreased range of motion.  No back pain.  Psych:  No change in mood or affect. No depression or anxiety.  No memory loss.     Objective:   Physical Exam   Filed Vitals:   07/06/12 1034  BP: 114/66  Pulse: 72  Temp: 98.4 F (36.9 C)  TempSrc: Oral  Height: 5' 3.5" (1.613 m)  Weight: 138 lb (62.596 kg)  SpO2: 96%    Gen: Pleasant, well-nourished, in no distress,  normal affect   ENT: No lesions,  mouth clear,  oropharynx clear, + postnasal drip, mild nasal inflammation  Neck: No JVD, no TMG, no carotid bruits  Lungs: No use of  accessory muscles, no dullness to percussion, no wheezes  Cardiovascular: RRR, heart sounds normal, no murmur or gallops, no peripheral edema  Abdomen: soft and NT, no HSM,  BS normal  Musculoskeletal: No deformities, no cyanosis or clubbing  Neuro: alert, non focal  Skin: Warm, no lesions or rash  Assessment & Plan:   Moderate persistent asthma, allergic triggers Moderate persistent asthma stable at this time with atopic triggers Plan Maintain inhaled medications as prescribed Return 4 months     Updated Medication List Outpatient Encounter Prescriptions as of 07/06/2012  Medication Sig Dispense Refill  . acetaminophen (TYLENOL) 500 MG tablet Take 500 mg by mouth every 4 (four) hours as needed.        Marland Kitchen albuterol (PROVENTIL HFA;VENTOLIN HFA) 108 (90 BASE) MCG/ACT inhaler Inhale 2 puffs into the lungs every 6 (six) hours as needed.        Marland Kitchen amLODipine (NORVASC) 5 MG tablet Take 5 mg by mouth daily.        . cetirizine (ZYRTEC) 10 MG tablet Take 10 mg by mouth daily.      Marland Kitchen FLUoxetine (PROZAC) 20 MG capsule Take 40 mg by mouth daily.       . fluticasone (FLONASE) 50 MCG/ACT nasal spray Place 1 spray into the nose 2 (two) times daily.      . Fluticasone-Salmeterol (ADVAIR DISKUS) 250-50 MCG/DOSE AEPB One puff twice daily  60 each  11  . hydrochlorothiazide 25 MG tablet Take 25 mg by mouth daily.        . montelukast (SINGULAIR) 10 MG tablet Take 1 tablet (10 mg total) by mouth daily.  60 tablet  11  . Multiple Vitamin (MULTIVITAMIN) tablet Take 1 tablet by mouth daily.        Marland Kitchen olmesartan (BENICAR) 40 MG tablet Take 40 mg by mouth daily.        Marland Kitchen omalizumab (XOLAIR) 150 MG injection 300 mg every 2 weeks       . [DISCONTINUED] cetirizine (ZYRTEC) 10 MG tablet HOLD for 7 days then resume  30 tablet  6  . [DISCONTINUED] fluticasone (FLONASE) 50 MCG/ACT nasal spray PLACE 2 SPRAYS INTO THE NOSE  DAILY.  16 g  5  . [DISCONTINUED] amoxicillin-clavulanate (AUGMENTIN) 875-125  MG per tablet Take 1 tablet by mouth 2 (two) times daily.  14 tablet  0  . [DISCONTINUED] predniSONE (DELTASONE) 10 MG tablet Take 4 for two days three for two days two for two days one for two days  20 tablet  0

## 2012-07-06 NOTE — Patient Instructions (Addendum)
No change in medications. Return in   3 months 

## 2012-07-07 ENCOUNTER — Ambulatory Visit (INDEPENDENT_AMBULATORY_CARE_PROVIDER_SITE_OTHER): Payer: Medicare Other

## 2012-07-07 DIAGNOSIS — J45909 Unspecified asthma, uncomplicated: Secondary | ICD-10-CM | POA: Diagnosis not present

## 2012-07-07 MED ORDER — OMALIZUMAB 150 MG ~~LOC~~ SOLR
300.0000 mg | Freq: Once | SUBCUTANEOUS | Status: AC
  Administered 2012-07-07: 300 mg via SUBCUTANEOUS

## 2012-07-07 NOTE — Assessment & Plan Note (Signed)
Moderate persistent asthma stable at this time with atopic triggers Plan Maintain inhaled medications as prescribed Return 4 months

## 2012-07-21 ENCOUNTER — Ambulatory Visit (INDEPENDENT_AMBULATORY_CARE_PROVIDER_SITE_OTHER): Payer: Medicare Other

## 2012-07-21 DIAGNOSIS — M545 Low back pain, unspecified: Secondary | ICD-10-CM | POA: Diagnosis not present

## 2012-07-21 DIAGNOSIS — J45909 Unspecified asthma, uncomplicated: Secondary | ICD-10-CM

## 2012-07-21 DIAGNOSIS — M999 Biomechanical lesion, unspecified: Secondary | ICD-10-CM | POA: Diagnosis not present

## 2012-07-22 DIAGNOSIS — J45909 Unspecified asthma, uncomplicated: Secondary | ICD-10-CM

## 2012-07-22 MED ORDER — OMALIZUMAB 150 MG ~~LOC~~ SOLR
300.0000 mg | Freq: Once | SUBCUTANEOUS | Status: AC
  Administered 2012-07-22: 300 mg via SUBCUTANEOUS

## 2012-07-25 DIAGNOSIS — M545 Low back pain, unspecified: Secondary | ICD-10-CM | POA: Diagnosis not present

## 2012-07-25 DIAGNOSIS — M999 Biomechanical lesion, unspecified: Secondary | ICD-10-CM | POA: Diagnosis not present

## 2012-08-01 DIAGNOSIS — M999 Biomechanical lesion, unspecified: Secondary | ICD-10-CM | POA: Diagnosis not present

## 2012-08-01 DIAGNOSIS — M545 Low back pain, unspecified: Secondary | ICD-10-CM | POA: Diagnosis not present

## 2012-08-04 ENCOUNTER — Ambulatory Visit (INDEPENDENT_AMBULATORY_CARE_PROVIDER_SITE_OTHER): Payer: Medicare Other

## 2012-08-04 DIAGNOSIS — J45909 Unspecified asthma, uncomplicated: Secondary | ICD-10-CM | POA: Diagnosis not present

## 2012-08-04 MED ORDER — OMALIZUMAB 150 MG ~~LOC~~ SOLR
300.0000 mg | Freq: Once | SUBCUTANEOUS | Status: AC
  Administered 2012-08-04: 300 mg via SUBCUTANEOUS

## 2012-08-18 ENCOUNTER — Ambulatory Visit (INDEPENDENT_AMBULATORY_CARE_PROVIDER_SITE_OTHER): Payer: Medicare Other

## 2012-08-18 DIAGNOSIS — J45909 Unspecified asthma, uncomplicated: Secondary | ICD-10-CM

## 2012-08-18 MED ORDER — OMALIZUMAB 150 MG ~~LOC~~ SOLR
300.0000 mg | Freq: Once | SUBCUTANEOUS | Status: AC
  Administered 2012-08-18: 300 mg via SUBCUTANEOUS

## 2012-09-01 ENCOUNTER — Ambulatory Visit (INDEPENDENT_AMBULATORY_CARE_PROVIDER_SITE_OTHER): Payer: Medicare Other

## 2012-09-01 DIAGNOSIS — J45909 Unspecified asthma, uncomplicated: Secondary | ICD-10-CM | POA: Diagnosis not present

## 2012-09-06 MED ORDER — OMALIZUMAB 150 MG ~~LOC~~ SOLR
300.0000 mg | Freq: Once | SUBCUTANEOUS | Status: AC
  Administered 2012-09-06: 300 mg via SUBCUTANEOUS

## 2012-09-15 ENCOUNTER — Ambulatory Visit: Payer: Medicare Other

## 2012-09-19 ENCOUNTER — Ambulatory Visit (INDEPENDENT_AMBULATORY_CARE_PROVIDER_SITE_OTHER): Payer: Medicare Other

## 2012-09-19 DIAGNOSIS — J45909 Unspecified asthma, uncomplicated: Secondary | ICD-10-CM | POA: Diagnosis not present

## 2012-09-20 MED ORDER — OMALIZUMAB 150 MG ~~LOC~~ SOLR
300.0000 mg | Freq: Once | SUBCUTANEOUS | Status: AC
  Administered 2012-09-20: 300 mg via SUBCUTANEOUS

## 2012-10-06 ENCOUNTER — Ambulatory Visit (INDEPENDENT_AMBULATORY_CARE_PROVIDER_SITE_OTHER): Payer: Medicare Other

## 2012-10-06 DIAGNOSIS — J45909 Unspecified asthma, uncomplicated: Secondary | ICD-10-CM

## 2012-10-06 MED ORDER — OMALIZUMAB 150 MG ~~LOC~~ SOLR
300.0000 mg | Freq: Once | SUBCUTANEOUS | Status: AC
  Administered 2012-10-06: 300 mg via SUBCUTANEOUS

## 2012-10-17 DIAGNOSIS — J301 Allergic rhinitis due to pollen: Secondary | ICD-10-CM | POA: Diagnosis not present

## 2012-10-17 DIAGNOSIS — Z79899 Other long term (current) drug therapy: Secondary | ICD-10-CM | POA: Diagnosis not present

## 2012-10-17 DIAGNOSIS — L508 Other urticaria: Secondary | ICD-10-CM | POA: Diagnosis not present

## 2012-10-17 DIAGNOSIS — F329 Major depressive disorder, single episode, unspecified: Secondary | ICD-10-CM | POA: Diagnosis not present

## 2012-10-17 DIAGNOSIS — K219 Gastro-esophageal reflux disease without esophagitis: Secondary | ICD-10-CM | POA: Diagnosis not present

## 2012-10-17 DIAGNOSIS — M810 Age-related osteoporosis without current pathological fracture: Secondary | ICD-10-CM | POA: Diagnosis not present

## 2012-10-17 DIAGNOSIS — M1990 Unspecified osteoarthritis, unspecified site: Secondary | ICD-10-CM | POA: Diagnosis not present

## 2012-10-17 DIAGNOSIS — F3289 Other specified depressive episodes: Secondary | ICD-10-CM | POA: Diagnosis not present

## 2012-10-17 DIAGNOSIS — N183 Chronic kidney disease, stage 3 unspecified: Secondary | ICD-10-CM | POA: Diagnosis not present

## 2012-10-20 ENCOUNTER — Ambulatory Visit (INDEPENDENT_AMBULATORY_CARE_PROVIDER_SITE_OTHER): Payer: Medicare Other

## 2012-10-20 DIAGNOSIS — J45909 Unspecified asthma, uncomplicated: Secondary | ICD-10-CM | POA: Diagnosis not present

## 2012-10-21 MED ORDER — OMALIZUMAB 150 MG ~~LOC~~ SOLR
300.0000 mg | Freq: Once | SUBCUTANEOUS | Status: AC
  Administered 2012-10-21: 300 mg via SUBCUTANEOUS

## 2012-11-02 ENCOUNTER — Ambulatory Visit: Payer: Medicare Other | Admitting: Critical Care Medicine

## 2012-11-03 ENCOUNTER — Ambulatory Visit (INDEPENDENT_AMBULATORY_CARE_PROVIDER_SITE_OTHER): Payer: Medicare Other

## 2012-11-03 DIAGNOSIS — J45909 Unspecified asthma, uncomplicated: Secondary | ICD-10-CM | POA: Diagnosis not present

## 2012-11-07 MED ORDER — OMALIZUMAB 150 MG ~~LOC~~ SOLR
300.0000 mg | Freq: Once | SUBCUTANEOUS | Status: AC
  Administered 2012-11-07: 300 mg via SUBCUTANEOUS

## 2012-11-17 ENCOUNTER — Ambulatory Visit (INDEPENDENT_AMBULATORY_CARE_PROVIDER_SITE_OTHER): Payer: Medicare Other

## 2012-11-17 DIAGNOSIS — J45909 Unspecified asthma, uncomplicated: Secondary | ICD-10-CM

## 2012-11-21 MED ORDER — OMALIZUMAB 150 MG ~~LOC~~ SOLR
300.0000 mg | Freq: Once | SUBCUTANEOUS | Status: AC
  Administered 2012-11-21: 300 mg via SUBCUTANEOUS

## 2012-12-01 ENCOUNTER — Ambulatory Visit (INDEPENDENT_AMBULATORY_CARE_PROVIDER_SITE_OTHER): Payer: Medicare Other

## 2012-12-01 DIAGNOSIS — J45909 Unspecified asthma, uncomplicated: Secondary | ICD-10-CM

## 2012-12-05 MED ORDER — OMALIZUMAB 150 MG ~~LOC~~ SOLR
300.0000 mg | Freq: Once | SUBCUTANEOUS | Status: AC
  Administered 2012-12-05: 300 mg via SUBCUTANEOUS

## 2012-12-07 ENCOUNTER — Ambulatory Visit (INDEPENDENT_AMBULATORY_CARE_PROVIDER_SITE_OTHER): Payer: Medicare Other | Admitting: Critical Care Medicine

## 2012-12-07 ENCOUNTER — Encounter: Payer: Self-pay | Admitting: Critical Care Medicine

## 2012-12-07 VITALS — BP 132/74 | HR 84 | Temp 98.6°F | Ht 63.0 in | Wt 132.0 lb

## 2012-12-07 DIAGNOSIS — Z9181 History of falling: Secondary | ICD-10-CM | POA: Insufficient documentation

## 2012-12-07 DIAGNOSIS — J45909 Unspecified asthma, uncomplicated: Secondary | ICD-10-CM | POA: Diagnosis not present

## 2012-12-07 DIAGNOSIS — J209 Acute bronchitis, unspecified: Secondary | ICD-10-CM

## 2012-12-07 MED ORDER — ALBUTEROL SULFATE HFA 108 (90 BASE) MCG/ACT IN AERS: 2.0000 | INHALATION_SPRAY | Freq: Four times a day (QID) | RESPIRATORY_TRACT | Status: DC | PRN

## 2012-12-07 MED ORDER — AMOXICILLIN-POT CLAVULANATE 875-125 MG PO TABS: 1.0000 | ORAL_TABLET | Freq: Two times a day (BID) | ORAL | Status: DC

## 2012-12-07 NOTE — Progress Notes (Signed)
Subjective:    Patient ID: [name], female    DOB: 04-May-1924, 79 y.o.   MRN: [id number]  HPI This is a 79 y.o.  white female, history of asthmatic bronchitis, severe, persistent. Severe Atopy on Xolair.   12/07/2012 xolair has helped.  Dyspnea ok  C/o pressure on R frontal sinus worse at night No mucus now.  No real wheeze.  No discolored mucus out of nose  PUL ASTHMA HISTORY 12/07/2012 07/06/2012 05/25/2012 02/24/2012 10/23/2011  Symptoms Daily 0-2 days/week Daily >2 days/week 0-2 days/week  Nighttime awakenings 0-2/month 0-2/month 0-2/month 0-2/month 0-2/month  Interference with activity No limitations No limitations Some limitations No limitations Minor limitations  SABA use Daily 0-2 days/wk 0-2 days/wk 0-2 days/wk 0-2 days/wk  Exacerbations requiring oral steroids 0-1 / year 0-1 / year 0-1 / year 0-1 / year 0-1 / year    Past Medical History  Diagnosis Date  . Asthma   . Hypertension   . Arthritis   . Depression   . Hyperlipidemia      Family History  Problem Relation Age of Onset  . Asthma    . Colon cancer Father      History   Social History  . Marital Status: Married    Spouse Name: N/A    Number of Children: N/A  . Years of Education: N/A   Occupational History  . retired    Social History Main Topics  . Smoking status: Never Smoker   . Smokeless tobacco: Never Used  . Alcohol Use: Yes  . Drug Use: No  . Sexually Active: Not on file   Other Topics Concern  . Not on file   Social History Narrative  . No narrative on file     Allergies  Allergen Reactions  . Aspirin   . Sulfonamide Derivatives      Outpatient Prescriptions Prior to Visit  Medication Sig Dispense Refill  . acetaminophen (TYLENOL) 500 MG tablet Take 500 mg by mouth every 4 (four) hours as needed.        Marland Kitchen amLODipine (NORVASC) 5 MG tablet Take 5 mg by mouth daily.        . cetirizine (ZYRTEC) 10 MG tablet Take 10 mg by mouth daily.      Marland Kitchen FLUoxetine (PROZAC) 20 MG capsule  Take 40 mg by mouth daily.       . fluticasone (FLONASE) 50 MCG/ACT nasal spray Place 1 spray into the nose 2 (two) times daily.      . Fluticasone-Salmeterol (ADVAIR DISKUS) 250-50 MCG/DOSE AEPB One puff twice daily  60 each  11  . hydrochlorothiazide 25 MG tablet Take 25 mg by mouth daily.        . montelukast (SINGULAIR) 10 MG tablet Take 1 tablet (10 mg total) by mouth daily.  60 tablet  11  . Multiple Vitamin (MULTIVITAMIN) tablet Take 1 tablet by mouth daily.        Marland Kitchen olmesartan (BENICAR) 40 MG tablet Take 40 mg by mouth daily.        Marland Kitchen omalizumab (XOLAIR) 150 MG injection 300 mg every 2 weeks       . albuterol (PROVENTIL HFA;VENTOLIN HFA) 108 (90 BASE) MCG/ACT inhaler Inhale 2 puffs into the lungs every 6 (six) hours as needed.         No facility-administered medications prior to visit.     Review of Systems Constitutional:   No  weight loss, night sweats,  Fevers, chills, fatigue, lassitude. HEENT:  No headaches,  Difficulty swallowing,  Tooth/dental problems,  Sore throat,                No sneezing, itching, ear ache, nasal congestion, post nasal drip,   CV:  No chest pain,  Orthopnea, PND, swelling in lower extremities, anasarca, dizziness, palpitations  GI  No heartburn, indigestion, abdominal pain, nausea, vomiting, diarrhea, change in bowel habits, loss of appetite  Resp: Notes  shortness of breath with exertion not at rest.  No excess mucus, no  productive cough,  No non-productive cough,  No coughing up of blood.  No change in color of mucus.  No wheezing.  No chest wall deformity  Skin: no rash or lesions.  GU: no dysuria, change in color of urine, no urgency or frequency.  No flank pain.  MS:  No joint pain or swelling.  No decreased range of motion.  No back pain.  Psych:  No change in mood or affect. No depression or anxiety.  No memory loss.     Objective:   Physical Exam   Filed Vitals:   12/07/12 1037  BP: 132/74  Pulse: 84  Temp: 98.6 F (37 C)   TempSrc: Oral  Height: 5\' 3"  (1.6 m)  Weight: 132 lb (59.875 kg)  SpO2: 95%    Gen: Pleasant, well-nourished, in no distress,  normal affect   ENT: No lesions,  mouth clear,  oropharynx clear, + postnasal drip, mild nasal inflammation  Neck: No JVD, no TMG, no carotid bruits  Lungs: No use of accessory muscles, no dullness to percussion, no wheezes  Cardiovascular: RRR, heart sounds normal, no murmur or gallops, no peripheral edema  Abdomen: soft and NT, no HSM,  BS normal  Musculoskeletal: No deformities, no cyanosis or clubbing  Neuro: alert, non focal  Skin: Warm, no lesions or rash         Assessment & Plan:   Moderate persistent asthma, allergic triggers Moderate persistent asthma with associated lower airway inflammation, mild bronchitis flare Plan Augmentin for 5 days Maintain inhaled medications as prescribed  At high risk for falls The patient remains at high risk for falls that she has fallen recently several times in her bathroom due to balance issues The patient does not appear to need physical therapy at this time but was counseled     Updated Medication List Outpatient Encounter Prescriptions as of 12/07/2012  Medication Sig Dispense Refill  . acetaminophen (TYLENOL) 500 MG tablet Take 500 mg by mouth every 4 (four) hours as needed.        Marland Kitchen albuterol (PROVENTIL HFA;VENTOLIN HFA) 108 (90 BASE) MCG/ACT inhaler Inhale 2 puffs into the lungs every 6 (six) hours as needed.  18 g  4  . amLODipine (NORVASC) 5 MG tablet Take 5 mg by mouth daily.        . cetirizine (ZYRTEC) 10 MG tablet Take 10 mg by mouth daily.      Marland Kitchen FLUoxetine (PROZAC) 20 MG capsule Take 40 mg by mouth daily.       . fluticasone (FLONASE) 50 MCG/ACT nasal spray Place 1 spray into the nose 2 (two) times daily.      . Fluticasone-Salmeterol (ADVAIR DISKUS) 250-50 MCG/DOSE AEPB One puff twice daily  60 each  11  . hydrochlorothiazide 25 MG tablet Take 25 mg by mouth daily.        .  montelukast (SINGULAIR) 10 MG tablet Take 1 tablet (10 mg total) by mouth daily.  60 tablet  11  . Multiple Vitamin (MULTIVITAMIN) tablet Take 1 tablet by mouth daily.        Marland Kitchen olmesartan (BENICAR) 40 MG tablet Take 40 mg by mouth daily.        Marland Kitchen omalizumab (XOLAIR) 150 MG injection 300 mg every 2 weeks       . [DISCONTINUED] albuterol (PROVENTIL HFA;VENTOLIN HFA) 108 (90 BASE) MCG/ACT inhaler Inhale 2 puffs into the lungs every 6 (six) hours as needed.        Marland Kitchen amoxicillin-clavulanate (AUGMENTIN) 875-125 MG per tablet Take 1 tablet by mouth 2 (two) times daily.  10 tablet  0   No facility-administered encounter medications on file as of 12/07/2012.

## 2012-12-07 NOTE — Assessment & Plan Note (Signed)
Moderate persistent asthma with associated lower airway inflammation, mild bronchitis flare Plan Augmentin for 5 days Maintain inhaled medications as prescribed

## 2012-12-07 NOTE — Assessment & Plan Note (Signed)
The patient remains at high risk for falls that she has fallen recently several times in her bathroom due to balance issues The patient does not appear to need physical therapy at this time but was counseled

## 2012-12-07 NOTE — Patient Instructions (Addendum)
Augmentin one twice daily for 5 days Watch your balance in your bathroom No other medication changes Return 4 months

## 2012-12-15 ENCOUNTER — Ambulatory Visit (INDEPENDENT_AMBULATORY_CARE_PROVIDER_SITE_OTHER): Payer: Medicare Other

## 2012-12-15 DIAGNOSIS — J45909 Unspecified asthma, uncomplicated: Secondary | ICD-10-CM

## 2012-12-15 MED ORDER — OMALIZUMAB 150 MG ~~LOC~~ SOLR
300.0000 mg | Freq: Once | SUBCUTANEOUS | Status: AC
  Administered 2012-12-15: 300 mg via SUBCUTANEOUS

## 2012-12-19 ENCOUNTER — Encounter: Payer: Self-pay | Admitting: Internal Medicine

## 2012-12-29 ENCOUNTER — Ambulatory Visit (INDEPENDENT_AMBULATORY_CARE_PROVIDER_SITE_OTHER): Payer: Medicare Other

## 2012-12-29 DIAGNOSIS — J45909 Unspecified asthma, uncomplicated: Secondary | ICD-10-CM | POA: Diagnosis not present

## 2012-12-30 MED ORDER — OMALIZUMAB 150 MG ~~LOC~~ SOLR
300.0000 mg | Freq: Once | SUBCUTANEOUS | Status: AC
  Administered 2012-12-30: 300 mg via SUBCUTANEOUS

## 2013-01-12 ENCOUNTER — Ambulatory Visit (INDEPENDENT_AMBULATORY_CARE_PROVIDER_SITE_OTHER): Payer: Medicare Other

## 2013-01-12 DIAGNOSIS — J45909 Unspecified asthma, uncomplicated: Secondary | ICD-10-CM

## 2013-01-12 MED ORDER — OMALIZUMAB 150 MG ~~LOC~~ SOLR
300.0000 mg | Freq: Once | SUBCUTANEOUS | Status: AC
  Administered 2013-01-12: 300 mg via SUBCUTANEOUS

## 2013-01-26 ENCOUNTER — Ambulatory Visit (INDEPENDENT_AMBULATORY_CARE_PROVIDER_SITE_OTHER): Payer: Medicare Other

## 2013-01-26 DIAGNOSIS — J45909 Unspecified asthma, uncomplicated: Secondary | ICD-10-CM | POA: Diagnosis not present

## 2013-02-01 MED ORDER — OMALIZUMAB 150 MG ~~LOC~~ SOLR
300.0000 mg | Freq: Once | SUBCUTANEOUS | Status: AC
  Administered 2013-02-01: 300 mg via SUBCUTANEOUS

## 2013-02-09 ENCOUNTER — Ambulatory Visit: Payer: Medicare Other

## 2013-02-09 DIAGNOSIS — H04129 Dry eye syndrome of unspecified lacrimal gland: Secondary | ICD-10-CM | POA: Diagnosis not present

## 2013-02-09 DIAGNOSIS — H43819 Vitreous degeneration, unspecified eye: Secondary | ICD-10-CM | POA: Diagnosis not present

## 2013-02-09 DIAGNOSIS — Z961 Presence of intraocular lens: Secondary | ICD-10-CM | POA: Diagnosis not present

## 2013-02-10 ENCOUNTER — Ambulatory Visit: Payer: Medicare Other

## 2013-02-10 ENCOUNTER — Ambulatory Visit (INDEPENDENT_AMBULATORY_CARE_PROVIDER_SITE_OTHER): Payer: Medicare Other

## 2013-02-10 DIAGNOSIS — J45909 Unspecified asthma, uncomplicated: Secondary | ICD-10-CM

## 2013-02-10 MED ORDER — OMALIZUMAB 150 MG ~~LOC~~ SOLR
300.0000 mg | Freq: Once | SUBCUTANEOUS | Status: AC
  Administered 2013-02-10: 300 mg via SUBCUTANEOUS

## 2013-02-15 DIAGNOSIS — M1710 Unilateral primary osteoarthritis, unspecified knee: Secondary | ICD-10-CM | POA: Diagnosis not present

## 2013-02-15 DIAGNOSIS — IMO0002 Reserved for concepts with insufficient information to code with codable children: Secondary | ICD-10-CM | POA: Diagnosis not present

## 2013-02-22 DIAGNOSIS — IMO0002 Reserved for concepts with insufficient information to code with codable children: Secondary | ICD-10-CM | POA: Diagnosis not present

## 2013-02-22 DIAGNOSIS — M1710 Unilateral primary osteoarthritis, unspecified knee: Secondary | ICD-10-CM | POA: Diagnosis not present

## 2013-02-23 ENCOUNTER — Ambulatory Visit (INDEPENDENT_AMBULATORY_CARE_PROVIDER_SITE_OTHER): Payer: Medicare Other

## 2013-02-23 DIAGNOSIS — J45909 Unspecified asthma, uncomplicated: Secondary | ICD-10-CM

## 2013-02-27 MED ORDER — OMALIZUMAB 150 MG ~~LOC~~ SOLR
300.0000 mg | Freq: Once | SUBCUTANEOUS | Status: AC
  Administered 2013-02-27: 300 mg via SUBCUTANEOUS

## 2013-03-01 DIAGNOSIS — M1710 Unilateral primary osteoarthritis, unspecified knee: Secondary | ICD-10-CM | POA: Diagnosis not present

## 2013-03-01 DIAGNOSIS — IMO0002 Reserved for concepts with insufficient information to code with codable children: Secondary | ICD-10-CM | POA: Diagnosis not present

## 2013-03-03 ENCOUNTER — Other Ambulatory Visit: Payer: Self-pay | Admitting: Critical Care Medicine

## 2013-03-13 ENCOUNTER — Ambulatory Visit: Payer: Medicare Other

## 2013-03-13 ENCOUNTER — Ambulatory Visit (INDEPENDENT_AMBULATORY_CARE_PROVIDER_SITE_OTHER): Payer: Medicare Other

## 2013-03-13 ENCOUNTER — Other Ambulatory Visit: Payer: Self-pay | Admitting: Critical Care Medicine

## 2013-03-13 DIAGNOSIS — J45909 Unspecified asthma, uncomplicated: Secondary | ICD-10-CM | POA: Diagnosis not present

## 2013-03-14 MED ORDER — OMALIZUMAB 150 MG ~~LOC~~ SOLR
300.0000 mg | Freq: Once | SUBCUTANEOUS | Status: AC
  Administered 2013-03-14: 300 mg via SUBCUTANEOUS

## 2013-03-29 ENCOUNTER — Ambulatory Visit (INDEPENDENT_AMBULATORY_CARE_PROVIDER_SITE_OTHER): Payer: Medicare Other

## 2013-03-29 DIAGNOSIS — J45909 Unspecified asthma, uncomplicated: Secondary | ICD-10-CM

## 2013-04-05 MED ORDER — OMALIZUMAB 150 MG ~~LOC~~ SOLR
300.0000 mg | Freq: Once | SUBCUTANEOUS | Status: AC
  Administered 2013-04-05: 300 mg via SUBCUTANEOUS

## 2013-04-12 ENCOUNTER — Ambulatory Visit (INDEPENDENT_AMBULATORY_CARE_PROVIDER_SITE_OTHER): Payer: Medicare Other

## 2013-04-12 DIAGNOSIS — J45909 Unspecified asthma, uncomplicated: Secondary | ICD-10-CM

## 2013-04-12 MED ORDER — OMALIZUMAB 150 MG ~~LOC~~ SOLR
300.0000 mg | Freq: Once | SUBCUTANEOUS | Status: AC
  Administered 2013-04-12: 300 mg via SUBCUTANEOUS

## 2013-04-26 ENCOUNTER — Ambulatory Visit (INDEPENDENT_AMBULATORY_CARE_PROVIDER_SITE_OTHER): Payer: Medicare Other

## 2013-04-26 DIAGNOSIS — J45909 Unspecified asthma, uncomplicated: Secondary | ICD-10-CM

## 2013-04-26 MED ORDER — OMALIZUMAB 150 MG ~~LOC~~ SOLR
300.0000 mg | Freq: Once | SUBCUTANEOUS | Status: AC
  Administered 2013-04-26: 300 mg via SUBCUTANEOUS

## 2013-05-03 ENCOUNTER — Other Ambulatory Visit: Payer: Self-pay | Admitting: Critical Care Medicine

## 2013-05-08 DIAGNOSIS — M810 Age-related osteoporosis without current pathological fracture: Secondary | ICD-10-CM | POA: Diagnosis not present

## 2013-05-08 DIAGNOSIS — L508 Other urticaria: Secondary | ICD-10-CM | POA: Diagnosis not present

## 2013-05-08 DIAGNOSIS — N183 Chronic kidney disease, stage 3 unspecified: Secondary | ICD-10-CM | POA: Diagnosis not present

## 2013-05-08 DIAGNOSIS — K219 Gastro-esophageal reflux disease without esophagitis: Secondary | ICD-10-CM | POA: Diagnosis not present

## 2013-05-08 DIAGNOSIS — I10 Essential (primary) hypertension: Secondary | ICD-10-CM | POA: Diagnosis not present

## 2013-05-08 DIAGNOSIS — F329 Major depressive disorder, single episode, unspecified: Secondary | ICD-10-CM | POA: Diagnosis not present

## 2013-05-08 DIAGNOSIS — Z79899 Other long term (current) drug therapy: Secondary | ICD-10-CM | POA: Diagnosis not present

## 2013-05-08 DIAGNOSIS — F3289 Other specified depressive episodes: Secondary | ICD-10-CM | POA: Diagnosis not present

## 2013-05-08 DIAGNOSIS — M1990 Unspecified osteoarthritis, unspecified site: Secondary | ICD-10-CM | POA: Diagnosis not present

## 2013-05-08 DIAGNOSIS — J301 Allergic rhinitis due to pollen: Secondary | ICD-10-CM | POA: Diagnosis not present

## 2013-05-08 DIAGNOSIS — Z23 Encounter for immunization: Secondary | ICD-10-CM | POA: Diagnosis not present

## 2013-05-10 ENCOUNTER — Ambulatory Visit (INDEPENDENT_AMBULATORY_CARE_PROVIDER_SITE_OTHER): Payer: Medicare Other

## 2013-05-10 DIAGNOSIS — J45909 Unspecified asthma, uncomplicated: Secondary | ICD-10-CM | POA: Diagnosis not present

## 2013-05-11 MED ORDER — OMALIZUMAB 150 MG ~~LOC~~ SOLR
300.0000 mg | Freq: Once | SUBCUTANEOUS | Status: AC
  Administered 2013-05-11: 300 mg via SUBCUTANEOUS

## 2013-05-24 ENCOUNTER — Ambulatory Visit (INDEPENDENT_AMBULATORY_CARE_PROVIDER_SITE_OTHER): Payer: Medicare Other

## 2013-05-24 DIAGNOSIS — J45909 Unspecified asthma, uncomplicated: Secondary | ICD-10-CM

## 2013-05-24 MED ORDER — OMALIZUMAB 150 MG ~~LOC~~ SOLR
300.0000 mg | Freq: Once | SUBCUTANEOUS | Status: AC
  Administered 2013-05-24: 300 mg via SUBCUTANEOUS

## 2013-06-07 ENCOUNTER — Ambulatory Visit (INDEPENDENT_AMBULATORY_CARE_PROVIDER_SITE_OTHER): Payer: Medicare Other

## 2013-06-07 ENCOUNTER — Ambulatory Visit (INDEPENDENT_AMBULATORY_CARE_PROVIDER_SITE_OTHER): Payer: Medicare Other | Admitting: Critical Care Medicine

## 2013-06-07 ENCOUNTER — Encounter: Payer: Self-pay | Admitting: Critical Care Medicine

## 2013-06-07 VITALS — BP 124/70 | HR 65 | Temp 98.3°F | Ht 62.0 in | Wt 130.6 lb

## 2013-06-07 DIAGNOSIS — J45909 Unspecified asthma, uncomplicated: Secondary | ICD-10-CM

## 2013-06-07 MED ORDER — OMALIZUMAB 150 MG ~~LOC~~ SOLR
300.0000 mg | Freq: Once | SUBCUTANEOUS | Status: AC
  Administered 2013-06-07: 300 mg via SUBCUTANEOUS

## 2013-06-07 NOTE — Assessment & Plan Note (Signed)
Moderate persistent asthma with significant allergic triggers Plan Maintain inhaled medications as

## 2013-06-07 NOTE — Progress Notes (Signed)
Subjective:    Patient ID: [name], female    DOB: 02-11-1924, 79 y.o.   MRN: [id number]  HPI  This is a 79 y.o.  white female, history of asthmatic bronchitis, severe, persistent. Severe Atopy on Xolair.   06/07/2013 Chief Complaint  Patient presents with  . Follow-up    Breathing is unchanged.  Does have PND and coughing some with thick, white mucus.  No SOB, wheezing, chest tightness, or chest pain.    Pt doing ok.  Notes some pndrip now.  No chest pain. No further falls Pt had some knee injections Pt denies any significant sore throat, nasal congestion or excess secretions, fever, chills, sweats, unintended weight loss, pleurtic or exertional chest pain, orthopnea PND, or leg swelling Pt denies any increase in rescue therapy over baseline, denies waking up needing it or having any early am or nocturnal exacerbations of coughing/wheezing/or dyspnea. Pt also denies any obvious fluctuation in symptoms with  weather or environmental change or other alleviating or aggravating factors     PUL ASTHMA HISTORY 06/07/2013 12/07/2012 07/06/2012 05/25/2012 02/24/2012  Symptoms 0-2 days/week Daily 0-2 days/week Daily >2 days/week  Nighttime awakenings 0-2/month 0-2/month 0-2/month 0-2/month 0-2/month  Interference with activity No limitations No limitations No limitations Some limitations No limitations  SABA use 0-2 days/wk Daily 0-2 days/wk 0-2 days/wk 0-2 days/wk  Exacerbations requiring oral steroids 0-1 / year 0-1 / year 0-1 / year 0-1 / year 0-1 / year    Past Medical History  Diagnosis Date  . Asthma   . Hypertension   . Arthritis   . Depression   . Hyperlipidemia      Family History  Problem Relation Age of Onset  . Asthma    . Colon cancer Father      History   Social History  . Marital Status: Married    Spouse Name: N/A    Number of Children: N/A  . Years of Education: N/A   Occupational History  . retired    Social History Main Topics  . Smoking status:  Never Smoker   . Smokeless tobacco: Never Used  . Alcohol Use: Yes  . Drug Use: No  . Sexual Activity: Not on file   Other Topics Concern  . Not on file   Social History Narrative  . No narrative on file     Allergies  Allergen Reactions  . Aspirin   . Sulfonamide Derivatives      Outpatient Prescriptions Prior to Visit  Medication Sig Dispense Refill  . acetaminophen (TYLENOL) 500 MG tablet Take 500 mg by mouth every 4 (four) hours as needed.        Marland Kitchen ADVAIR DISKUS 250-50 MCG/DOSE AEPB ONE PUFF TWICE DAILY  60 each  6  . albuterol (PROVENTIL HFA;VENTOLIN HFA) 108 (90 BASE) MCG/ACT inhaler Inhale 2 puffs into the lungs every 6 (six) hours as needed.  18 g  4  . amLODipine (NORVASC) 5 MG tablet Take 5 mg by mouth daily.        . cetirizine (ZYRTEC) 10 MG tablet Take 10 mg by mouth daily.      Marland Kitchen FLUoxetine (PROZAC) 20 MG capsule Take 40 mg by mouth daily.       . fluticasone (FLONASE) 50 MCG/ACT nasal spray PLACE 2 SPRAYS INTO THE NOSE   DAILY.  16 g  4  . hydrochlorothiazide 25 MG tablet Take 25 mg by mouth daily.        . montelukast (SINGULAIR) 10  MG tablet TAKE 1 TABLET (10 MG TOTAL) BY MOUTH DAILY.  60 tablet  6  . olmesartan (BENICAR) 40 MG tablet Take 40 mg by mouth daily.        Marland Kitchen omalizumab (XOLAIR) 150 MG injection 300 mg every 2 weeks       . fluticasone (FLONASE) 50 MCG/ACT nasal spray Place 1 spray into the nose 2 (two) times daily.      Marland Kitchen amoxicillin-clavulanate (AUGMENTIN) 875-125 MG per tablet Take 1 tablet by mouth 2 (two) times daily.  10 tablet  0  . Multiple Vitamin (MULTIVITAMIN) tablet Take 1 tablet by mouth daily.         No facility-administered medications prior to visit.     Review of Systems  Constitutional:   No  weight loss, night sweats,  Fevers, chills, fatigue, lassitude. HEENT:   No headaches,  Difficulty swallowing,  Tooth/dental problems,  Sore throat,                No sneezing, itching, ear ache, nasal congestion, post nasal drip,   CV:   No chest pain,  Orthopnea, PND, swelling in lower extremities, anasarca, dizziness, palpitations  GI  No heartburn, indigestion, abdominal pain, nausea, vomiting, diarrhea, change in bowel habits, loss of appetite  Resp: Notes  shortness of breath with exertion not at rest.  No excess mucus, no  productive cough,  No non-productive cough,  No coughing up of blood.  No change in color of mucus.  No wheezing.  No chest wall deformity  Skin: no rash or lesions.  GU: no dysuria, change in color of urine, no urgency or frequency.  No flank pain.  MS:  No joint pain or swelling.  No decreased range of motion.  No back pain.  Psych:  No change in mood or affect. No depression or anxiety.  No memory loss.     Objective:   Physical Exam   Filed Vitals:   06/07/13 1043  BP: 124/70  Pulse: 65  Temp: 98.3 F (36.8 C)  TempSrc: Oral  Height: 5\' 2"  (1.575 m)  Weight: 130 lb 9.6 oz (59.24 kg)  SpO2: 97%    Gen: Pleasant, well-nourished, in no distress,  normal affect   ENT: No lesions,  mouth clear,  oropharynx clear, + postnasal drip, mild nasal inflammation  Neck: No JVD, no TMG, no carotid bruits  Lungs: No use of accessory muscles, no dullness to percussion, no wheezes  Cardiovascular: RRR, heart sounds normal, no murmur or gallops, no peripheral edema  Abdomen: soft and NT, no HSM,  BS normal  Musculoskeletal: No deformities, no cyanosis or clubbing  Neuro: alert, non focal  Skin: Warm, no lesions or rash     Assessment & Plan:   Moderate persistent asthma without complication Moderate persistent asthma with significant allergic triggers Plan Maintain inhaled medications as    Updated Medication List Outpatient Encounter Prescriptions as of 06/07/2013  Medication Sig  . acetaminophen (TYLENOL) 500 MG tablet Take 500 mg by mouth every 4 (four) hours as needed.    Marland Kitchen ADVAIR DISKUS 250-50 MCG/DOSE AEPB ONE PUFF TWICE DAILY  . albuterol (PROVENTIL HFA;VENTOLIN HFA)  108 (90 BASE) MCG/ACT inhaler Inhale 2 puffs into the lungs every 6 (six) hours as needed.  Marland Kitchen amLODipine (NORVASC) 5 MG tablet Take 5 mg by mouth daily.    . cetirizine (ZYRTEC) 10 MG tablet Take 10 mg by mouth daily.  Marland Kitchen FLUoxetine (PROZAC) 20 MG capsule Take 40 mg by  mouth daily.   . fluticasone (FLONASE) 50 MCG/ACT nasal spray PLACE 2 SPRAYS INTO THE NOSE   DAILY.  . hydrochlorothiazide 25 MG tablet Take 25 mg by mouth daily.    . montelukast (SINGULAIR) 10 MG tablet TAKE 1 TABLET (10 MG TOTAL) BY MOUTH DAILY.  Marland Kitchen olmesartan (BENICAR) 40 MG tablet Take 40 mg by mouth daily.    Marland Kitchen omalizumab (XOLAIR) 150 MG injection 300 mg every 2 weeks   . [DISCONTINUED] fluticasone (FLONASE) 50 MCG/ACT nasal spray Place 1 spray into the nose 2 (two) times daily.  . [DISCONTINUED] amoxicillin-clavulanate (AUGMENTIN) 875-125 MG per tablet Take 1 tablet by mouth 2 (two) times daily.  . [DISCONTINUED] Multiple Vitamin (MULTIVITAMIN) tablet Take 1 tablet by mouth daily.

## 2013-06-07 NOTE — Patient Instructions (Signed)
No change in medications. Return in         4 months 

## 2013-06-21 ENCOUNTER — Ambulatory Visit (INDEPENDENT_AMBULATORY_CARE_PROVIDER_SITE_OTHER): Payer: Medicare Other

## 2013-06-21 DIAGNOSIS — J45909 Unspecified asthma, uncomplicated: Secondary | ICD-10-CM

## 2013-06-21 MED ORDER — OMALIZUMAB 150 MG ~~LOC~~ SOLR
300.0000 mg | Freq: Once | SUBCUTANEOUS | Status: AC
  Administered 2013-06-21: 300 mg via SUBCUTANEOUS

## 2013-07-05 ENCOUNTER — Ambulatory Visit (INDEPENDENT_AMBULATORY_CARE_PROVIDER_SITE_OTHER): Payer: Medicare Other

## 2013-07-05 DIAGNOSIS — J4540 Moderate persistent asthma, uncomplicated: Secondary | ICD-10-CM

## 2013-07-05 DIAGNOSIS — J45909 Unspecified asthma, uncomplicated: Secondary | ICD-10-CM | POA: Diagnosis not present

## 2013-07-05 MED ORDER — OMALIZUMAB 150 MG ~~LOC~~ SOLR
300.0000 mg | Freq: Once | SUBCUTANEOUS | Status: AC
  Administered 2013-07-05: 300 mg via SUBCUTANEOUS

## 2013-07-07 ENCOUNTER — Encounter: Payer: Self-pay | Admitting: Critical Care Medicine

## 2013-07-19 ENCOUNTER — Ambulatory Visit (INDEPENDENT_AMBULATORY_CARE_PROVIDER_SITE_OTHER): Payer: Medicare Other

## 2013-07-19 DIAGNOSIS — J45909 Unspecified asthma, uncomplicated: Secondary | ICD-10-CM | POA: Diagnosis not present

## 2013-07-20 MED ORDER — OMALIZUMAB 150 MG ~~LOC~~ SOLR
300.0000 mg | Freq: Once | SUBCUTANEOUS | Status: AC
  Administered 2013-07-20: 300 mg via SUBCUTANEOUS

## 2013-07-31 ENCOUNTER — Other Ambulatory Visit: Payer: Self-pay | Admitting: Dermatology

## 2013-07-31 DIAGNOSIS — L570 Actinic keratosis: Secondary | ICD-10-CM | POA: Diagnosis not present

## 2013-07-31 DIAGNOSIS — D239 Other benign neoplasm of skin, unspecified: Secondary | ICD-10-CM | POA: Diagnosis not present

## 2013-07-31 DIAGNOSIS — D485 Neoplasm of uncertain behavior of skin: Secondary | ICD-10-CM | POA: Diagnosis not present

## 2013-07-31 DIAGNOSIS — M7130 Other bursal cyst, unspecified site: Secondary | ICD-10-CM | POA: Diagnosis not present

## 2013-07-31 DIAGNOSIS — D2330 Other benign neoplasm of skin of unspecified part of face: Secondary | ICD-10-CM | POA: Diagnosis not present

## 2013-07-31 DIAGNOSIS — Z85828 Personal history of other malignant neoplasm of skin: Secondary | ICD-10-CM | POA: Diagnosis not present

## 2013-07-31 DIAGNOSIS — D1801 Hemangioma of skin and subcutaneous tissue: Secondary | ICD-10-CM | POA: Diagnosis not present

## 2013-07-31 DIAGNOSIS — L821 Other seborrheic keratosis: Secondary | ICD-10-CM | POA: Diagnosis not present

## 2013-07-31 DIAGNOSIS — D0460 Carcinoma in situ of skin of unspecified upper limb, including shoulder: Secondary | ICD-10-CM | POA: Diagnosis not present

## 2013-08-02 ENCOUNTER — Ambulatory Visit (INDEPENDENT_AMBULATORY_CARE_PROVIDER_SITE_OTHER): Payer: Medicare Other

## 2013-08-02 DIAGNOSIS — J45909 Unspecified asthma, uncomplicated: Secondary | ICD-10-CM | POA: Diagnosis not present

## 2013-08-02 MED ORDER — OMALIZUMAB 150 MG ~~LOC~~ SOLR
300.0000 mg | Freq: Once | SUBCUTANEOUS | Status: AC
  Administered 2013-08-02: 300 mg via SUBCUTANEOUS

## 2013-08-04 ENCOUNTER — Encounter: Payer: Self-pay | Admitting: Internal Medicine

## 2013-08-05 ENCOUNTER — Other Ambulatory Visit: Payer: Self-pay | Admitting: Critical Care Medicine

## 2013-08-16 ENCOUNTER — Ambulatory Visit: Payer: Medicare Other

## 2013-08-21 ENCOUNTER — Ambulatory Visit (INDEPENDENT_AMBULATORY_CARE_PROVIDER_SITE_OTHER): Payer: Medicare Other

## 2013-08-21 DIAGNOSIS — J45909 Unspecified asthma, uncomplicated: Secondary | ICD-10-CM

## 2013-08-22 ENCOUNTER — Ambulatory Visit: Payer: Medicare Other

## 2013-08-22 MED ORDER — OMALIZUMAB 150 MG ~~LOC~~ SOLR
300.0000 mg | Freq: Once | SUBCUTANEOUS | Status: AC
  Administered 2013-08-22: 300 mg via SUBCUTANEOUS

## 2013-08-28 DIAGNOSIS — Z85828 Personal history of other malignant neoplasm of skin: Secondary | ICD-10-CM | POA: Diagnosis not present

## 2013-08-28 DIAGNOSIS — D0460 Carcinoma in situ of skin of unspecified upper limb, including shoulder: Secondary | ICD-10-CM | POA: Diagnosis not present

## 2013-08-28 DIAGNOSIS — M7130 Other bursal cyst, unspecified site: Secondary | ICD-10-CM | POA: Diagnosis not present

## 2013-09-04 ENCOUNTER — Ambulatory Visit (INDEPENDENT_AMBULATORY_CARE_PROVIDER_SITE_OTHER): Payer: Medicare Other

## 2013-09-04 DIAGNOSIS — J45909 Unspecified asthma, uncomplicated: Secondary | ICD-10-CM

## 2013-09-05 MED ORDER — OMALIZUMAB 150 MG ~~LOC~~ SOLR
300.0000 mg | Freq: Once | SUBCUTANEOUS | Status: AC
  Administered 2013-09-05: 300 mg via SUBCUTANEOUS

## 2013-09-20 ENCOUNTER — Ambulatory Visit: Payer: Medicare Other

## 2013-09-25 ENCOUNTER — Ambulatory Visit (INDEPENDENT_AMBULATORY_CARE_PROVIDER_SITE_OTHER): Payer: Medicare Other

## 2013-09-25 DIAGNOSIS — J45909 Unspecified asthma, uncomplicated: Secondary | ICD-10-CM

## 2013-09-29 MED ORDER — OMALIZUMAB 150 MG ~~LOC~~ SOLR
300.0000 mg | Freq: Once | SUBCUTANEOUS | Status: AC
  Administered 2013-09-29: 300 mg via SUBCUTANEOUS

## 2013-10-09 ENCOUNTER — Ambulatory Visit (INDEPENDENT_AMBULATORY_CARE_PROVIDER_SITE_OTHER): Payer: Medicare Other

## 2013-10-09 DIAGNOSIS — J45909 Unspecified asthma, uncomplicated: Secondary | ICD-10-CM | POA: Diagnosis not present

## 2013-10-11 MED ORDER — OMALIZUMAB 150 MG ~~LOC~~ SOLR
300.0000 mg | Freq: Once | SUBCUTANEOUS | Status: AC
  Administered 2013-10-11: 300 mg via SUBCUTANEOUS

## 2013-10-23 ENCOUNTER — Ambulatory Visit (INDEPENDENT_AMBULATORY_CARE_PROVIDER_SITE_OTHER): Payer: Medicare Other

## 2013-10-23 DIAGNOSIS — J45909 Unspecified asthma, uncomplicated: Secondary | ICD-10-CM | POA: Diagnosis not present

## 2013-10-26 MED ORDER — OMALIZUMAB 150 MG ~~LOC~~ SOLR
300.0000 mg | Freq: Once | SUBCUTANEOUS | Status: AC
  Administered 2013-10-26: 300 mg via SUBCUTANEOUS

## 2013-10-27 ENCOUNTER — Encounter: Payer: Self-pay | Admitting: Internal Medicine

## 2013-11-06 ENCOUNTER — Ambulatory Visit: Payer: Medicare Other

## 2013-11-08 ENCOUNTER — Ambulatory Visit (INDEPENDENT_AMBULATORY_CARE_PROVIDER_SITE_OTHER): Payer: Medicare Other

## 2013-11-08 DIAGNOSIS — J45909 Unspecified asthma, uncomplicated: Secondary | ICD-10-CM | POA: Diagnosis not present

## 2013-11-08 MED ORDER — OMALIZUMAB 150 MG ~~LOC~~ SOLR
300.0000 mg | Freq: Once | SUBCUTANEOUS | Status: AC
  Administered 2013-11-08: 300 mg via SUBCUTANEOUS

## 2013-11-13 DIAGNOSIS — N183 Chronic kidney disease, stage 3 unspecified: Secondary | ICD-10-CM | POA: Diagnosis not present

## 2013-11-13 DIAGNOSIS — J301 Allergic rhinitis due to pollen: Secondary | ICD-10-CM | POA: Diagnosis not present

## 2013-11-13 DIAGNOSIS — I10 Essential (primary) hypertension: Secondary | ICD-10-CM | POA: Diagnosis not present

## 2013-11-13 DIAGNOSIS — M1990 Unspecified osteoarthritis, unspecified site: Secondary | ICD-10-CM | POA: Diagnosis not present

## 2013-11-13 DIAGNOSIS — F329 Major depressive disorder, single episode, unspecified: Secondary | ICD-10-CM | POA: Diagnosis not present

## 2013-11-13 DIAGNOSIS — Z79899 Other long term (current) drug therapy: Secondary | ICD-10-CM | POA: Diagnosis not present

## 2013-11-13 DIAGNOSIS — K219 Gastro-esophageal reflux disease without esophagitis: Secondary | ICD-10-CM | POA: Diagnosis not present

## 2013-11-13 DIAGNOSIS — L508 Other urticaria: Secondary | ICD-10-CM | POA: Diagnosis not present

## 2013-11-13 DIAGNOSIS — F3289 Other specified depressive episodes: Secondary | ICD-10-CM | POA: Diagnosis not present

## 2013-11-22 ENCOUNTER — Ambulatory Visit (INDEPENDENT_AMBULATORY_CARE_PROVIDER_SITE_OTHER): Payer: Medicare Other

## 2013-11-22 DIAGNOSIS — J45909 Unspecified asthma, uncomplicated: Secondary | ICD-10-CM | POA: Diagnosis not present

## 2013-11-23 ENCOUNTER — Other Ambulatory Visit: Payer: Self-pay | Admitting: Critical Care Medicine

## 2013-11-24 MED ORDER — OMALIZUMAB 150 MG ~~LOC~~ SOLR
300.0000 mg | Freq: Once | SUBCUTANEOUS | Status: AC
  Administered 2013-11-24: 300 mg via SUBCUTANEOUS

## 2013-11-29 DIAGNOSIS — M1710 Unilateral primary osteoarthritis, unspecified knee: Secondary | ICD-10-CM | POA: Diagnosis not present

## 2013-11-29 DIAGNOSIS — IMO0002 Reserved for concepts with insufficient information to code with codable children: Secondary | ICD-10-CM | POA: Diagnosis not present

## 2013-12-06 ENCOUNTER — Ambulatory Visit (INDEPENDENT_AMBULATORY_CARE_PROVIDER_SITE_OTHER): Payer: Medicare Other

## 2013-12-06 DIAGNOSIS — J45909 Unspecified asthma, uncomplicated: Secondary | ICD-10-CM | POA: Diagnosis not present

## 2013-12-06 DIAGNOSIS — M1710 Unilateral primary osteoarthritis, unspecified knee: Secondary | ICD-10-CM | POA: Diagnosis not present

## 2013-12-06 MED ORDER — OMALIZUMAB 150 MG ~~LOC~~ SOLR
300.0000 mg | Freq: Once | SUBCUTANEOUS | Status: AC
  Administered 2013-12-06: 300 mg via SUBCUTANEOUS

## 2013-12-12 ENCOUNTER — Encounter: Payer: Self-pay | Admitting: Internal Medicine

## 2013-12-13 DIAGNOSIS — M1710 Unilateral primary osteoarthritis, unspecified knee: Secondary | ICD-10-CM | POA: Diagnosis not present

## 2013-12-20 ENCOUNTER — Ambulatory Visit (INDEPENDENT_AMBULATORY_CARE_PROVIDER_SITE_OTHER): Payer: Medicare Other

## 2013-12-20 DIAGNOSIS — J45909 Unspecified asthma, uncomplicated: Secondary | ICD-10-CM | POA: Diagnosis not present

## 2013-12-20 MED ORDER — OMALIZUMAB 150 MG ~~LOC~~ SOLR
300.0000 mg | Freq: Once | SUBCUTANEOUS | Status: AC
  Administered 2013-12-20: 300 mg via SUBCUTANEOUS

## 2014-01-03 ENCOUNTER — Ambulatory Visit (INDEPENDENT_AMBULATORY_CARE_PROVIDER_SITE_OTHER): Payer: Medicare Other

## 2014-01-03 DIAGNOSIS — J45909 Unspecified asthma, uncomplicated: Secondary | ICD-10-CM

## 2014-01-03 DIAGNOSIS — J4540 Moderate persistent asthma, uncomplicated: Secondary | ICD-10-CM

## 2014-01-04 MED ORDER — OMALIZUMAB 150 MG ~~LOC~~ SOLR
300.0000 mg | Freq: Once | SUBCUTANEOUS | Status: AC
  Administered 2014-01-04: 300 mg via SUBCUTANEOUS

## 2014-01-17 ENCOUNTER — Ambulatory Visit (INDEPENDENT_AMBULATORY_CARE_PROVIDER_SITE_OTHER): Payer: Medicare Other

## 2014-01-17 DIAGNOSIS — J4540 Moderate persistent asthma, uncomplicated: Secondary | ICD-10-CM

## 2014-01-17 DIAGNOSIS — J45909 Unspecified asthma, uncomplicated: Secondary | ICD-10-CM | POA: Diagnosis not present

## 2014-01-18 MED ORDER — OMALIZUMAB 150 MG ~~LOC~~ SOLR
300.0000 mg | Freq: Once | SUBCUTANEOUS | Status: AC
  Administered 2014-01-18: 300 mg via SUBCUTANEOUS

## 2014-01-31 ENCOUNTER — Ambulatory Visit (INDEPENDENT_AMBULATORY_CARE_PROVIDER_SITE_OTHER): Payer: Medicare Other

## 2014-01-31 DIAGNOSIS — J45909 Unspecified asthma, uncomplicated: Secondary | ICD-10-CM | POA: Diagnosis not present

## 2014-01-31 DIAGNOSIS — J4540 Moderate persistent asthma, uncomplicated: Secondary | ICD-10-CM

## 2014-02-01 MED ORDER — OMALIZUMAB 150 MG ~~LOC~~ SOLR
300.0000 mg | Freq: Once | SUBCUTANEOUS | Status: AC
  Administered 2014-02-01: 300 mg via SUBCUTANEOUS

## 2014-02-14 ENCOUNTER — Ambulatory Visit (INDEPENDENT_AMBULATORY_CARE_PROVIDER_SITE_OTHER): Payer: Medicare Other

## 2014-02-14 DIAGNOSIS — J4540 Moderate persistent asthma, uncomplicated: Secondary | ICD-10-CM

## 2014-02-14 DIAGNOSIS — J45909 Unspecified asthma, uncomplicated: Secondary | ICD-10-CM | POA: Diagnosis not present

## 2014-02-15 MED ORDER — OMALIZUMAB 150 MG ~~LOC~~ SOLR
300.0000 mg | Freq: Once | SUBCUTANEOUS | Status: AC
  Administered 2014-02-15: 300 mg via SUBCUTANEOUS

## 2014-02-19 ENCOUNTER — Ambulatory Visit (INDEPENDENT_AMBULATORY_CARE_PROVIDER_SITE_OTHER): Payer: Medicare Other | Admitting: Adult Health

## 2014-02-19 ENCOUNTER — Encounter: Payer: Self-pay | Admitting: Adult Health

## 2014-02-19 VITALS — BP 142/70 | HR 65 | Temp 98.2°F | Ht 61.0 in | Wt 129.0 lb

## 2014-02-19 DIAGNOSIS — J45909 Unspecified asthma, uncomplicated: Secondary | ICD-10-CM

## 2014-02-19 DIAGNOSIS — J4540 Moderate persistent asthma, uncomplicated: Secondary | ICD-10-CM

## 2014-02-19 MED ORDER — AZITHROMYCIN 250 MG PO TABS: ORAL_TABLET | ORAL | Status: AC

## 2014-02-19 MED ORDER — LEVALBUTEROL HCL 0.63 MG/3ML IN NEBU
0.6300 mg | INHALATION_SOLUTION | Freq: Once | RESPIRATORY_TRACT | Status: AC
  Administered 2014-02-19: 0.63 mg via RESPIRATORY_TRACT

## 2014-02-19 MED ORDER — PREDNISONE 10 MG PO TABS: ORAL_TABLET | ORAL | Status: DC

## 2014-02-19 NOTE — Patient Instructions (Signed)
Z-Pak take as directed, take with food. Mucinex DM twice daily as needed. For cough, congestion. Prednisone taper over next week. Fluids and rest. Follow Dr. Joya Gaskins as planned. As needed. Please contact office for sooner follow up if symptoms do not improve or worsen or seek emergency care

## 2014-02-19 NOTE — Progress Notes (Signed)
  Subjective:    Patient ID: [name], female    DOB: Oct 15, 1923, 80 y.o.   MRN: [id number]  HPI  This is a 80 y.o.  white female, history of asthmatic bronchitis, severe, persistent. Severe Atopy on Xolair.   06/07/2013 Chief Complaint  Patient presents with  . Follow-up    Breathing is unchanged.  Does have PND and coughing some with thick, white mucus.  No SOB, wheezing, chest tightness, or chest pain.    Pt doing ok.  Notes some pndrip now.  No chest pain. No further falls Pt had some knee injections  02/19/2014 Acute OV  Complains of prod cough with yellow and off white mucous x 1 month. Denies SOB and CP/tightness.  She has been using Mucinex without much of a. She denies any hemoptysis, orthopnea, PND, or increased leg swelling. Says she's been doing well until the last 3-4 weeks. Feels that he does make her breathing worse. She is any recent travel or antibiotic use.  Review of Systems  Constitutional:   No  weight loss, night sweats,  Fevers, chills, fatigue, lassitude. HEENT:   No headaches,  Difficulty swallowing,  Tooth/dental problems,  Sore throat,                No sneezing, itching, ear ache,  +nasal congestion, post nasal drip,   CV:  No chest pain,  Orthopnea, PND, swelling in lower extremities, anasarca, dizziness, palpitations  GI  No heartburn, indigestion, abdominal pain, nausea, vomiting, diarrhea, change in bowel habits, loss of appetite  Resp:   No chest wall deformity  Skin: no rash or lesions.  GU: no dysuria, change in color of urine, no urgency or frequency.  No flank pain.  MS:  No joint pain or swelling.  No decreased range of motion.  No back pain.  Psych:  No change in mood or affect. No depression or anxiety.  No memory loss.     Objective:   Physical Exam  Gen: Pleasant, well-nourished, in no distress,  normal affect   ENT: No lesions,  mouth clear,  oropharynx clear, + postnasal drip, mild nasal inflammation  Neck: No JVD, no  TMG, no carotid bruits  Lungs: No use of accessory muscles, no dullness to percussion, exp wheezing   Cardiovascular: RRR, heart sounds normal, no murmur or gallops, no peripheral edema  Abdomen: soft and NT, no HSM,  BS normal  Musculoskeletal: No deformities, no cyanosis or clubbing  Neuro: alert, non focal  Skin: Warm, no lesions or rash     Assessment & Plan:

## 2014-02-19 NOTE — Addendum Note (Signed)
Addended by: Maurice March on: 02/19/2014 12:02 PM   Modules accepted: Orders

## 2014-02-19 NOTE — Assessment & Plan Note (Addendum)
Flare  Xopenex neb x 1   Plan  Z-Pak take as directed, take with food. Mucinex DM twice daily as needed. For cough, congestion. Prednisone taper over next week. Fluids and rest. Follow Dr. Joya Gaskins as planned. As needed. Please contact office for sooner follow up if symptoms do not improve or worsen or seek emergency care

## 2014-02-28 ENCOUNTER — Ambulatory Visit (INDEPENDENT_AMBULATORY_CARE_PROVIDER_SITE_OTHER): Payer: Medicare Other

## 2014-02-28 DIAGNOSIS — J45909 Unspecified asthma, uncomplicated: Secondary | ICD-10-CM | POA: Diagnosis not present

## 2014-02-28 DIAGNOSIS — J4540 Moderate persistent asthma, uncomplicated: Secondary | ICD-10-CM

## 2014-03-01 MED ORDER — OMALIZUMAB 150 MG ~~LOC~~ SOLR
300.0000 mg | Freq: Once | SUBCUTANEOUS | Status: AC
  Administered 2014-03-01: 300 mg via SUBCUTANEOUS

## 2014-03-05 ENCOUNTER — Encounter: Payer: Self-pay | Admitting: Critical Care Medicine

## 2014-03-05 ENCOUNTER — Ambulatory Visit (INDEPENDENT_AMBULATORY_CARE_PROVIDER_SITE_OTHER): Payer: Medicare Other | Admitting: Critical Care Medicine

## 2014-03-05 VITALS — BP 128/80 | HR 68 | Ht 62.0 in | Wt 128.0 lb

## 2014-03-05 DIAGNOSIS — J4540 Moderate persistent asthma, uncomplicated: Secondary | ICD-10-CM

## 2014-03-05 DIAGNOSIS — J45909 Unspecified asthma, uncomplicated: Secondary | ICD-10-CM

## 2014-03-05 DIAGNOSIS — Z23 Encounter for immunization: Secondary | ICD-10-CM

## 2014-03-05 NOTE — Patient Instructions (Signed)
Prevnar 13 was given No change in medications Return 4 months Get flu vaccine in early september

## 2014-03-05 NOTE — Assessment & Plan Note (Signed)
Moderate persistent asthma with recent exacerbation now resolved Plan Maintain inhaled medications including Advair Prevnar 13 was given

## 2014-03-05 NOTE — Progress Notes (Signed)
Subjective:    Patient ID: [name], female    DOB: 01/23/1924, 80 y.o.   MRN: [id number]  HPI 03/05/2014 Chief Complaint  Patient presents with  . Follow-up    Pt recently got over bronchitis a couple weeks ago. Pt has no breathing complaints at this time.   Pt got over bronchitis , NP saw the pt and rx and is better.  No cough and no mucus. Pt denies any significant sore throat, nasal congestion or excess secretions, fever, chills, sweats, unintended weight loss, pleurtic or exertional chest pain, orthopnea PND, or leg swelling Pt denies any increase in rescue therapy over baseline, denies waking up needing it or having any early am or nocturnal exacerbations of coughing/wheezing/or dyspnea. Pt also denies any obvious fluctuation in symptoms with  weather or environmental change or other alleviating or aggravating factors   Review of Systems Constitutional:   No  weight loss, night sweats,  Fevers, chills, fatigue, lassitude. HEENT:   No headaches,  Difficulty swallowing,  Tooth/dental problems,  Sore throat,                No sneezing, itching, ear ache, nasal congestion, post nasal drip,   CV:  No chest pain,  Orthopnea, PND, swelling in lower extremities, anasarca, dizziness, palpitations  GI  No heartburn, indigestion, abdominal pain, nausea, vomiting, diarrhea, change in bowel habits, loss of appetite  Resp: No shortness of breath with exertion or at rest.  No excess mucus, no productive cough,  No non-productive cough,  No coughing up of blood.  No change in color of mucus.  No wheezing.  No chest wall deformity  Skin: no rash or lesions.  GU: no dysuria, change in color of urine, no urgency or frequency.  No flank pain.  MS:  No joint pain or swelling.  No decreased range of motion.  No back pain.  Psych:  No change in mood or affect. No depression or anxiety.  No memory loss.     Objective:   Physical Exam Filed Vitals:   03/05/14 1052  BP: 128/80  Pulse: 68    Height: 5\' 2"  (1.575 m)  Weight: 128 lb (58.06 kg)  SpO2: 95%    Gen: Pleasant, well-nourished, in no distress,  normal affect  ENT: No lesions,  mouth clear,  oropharynx clear, no postnasal drip  Neck: No JVD, no TMG, no carotid bruits  Lungs: No use of accessory muscles, no dullness to percussion, clear without rales or rhonchi  Cardiovascular: RRR, heart sounds normal, no murmur or gallops, no peripheral edema  Abdomen: soft and NT, no HSM,  BS normal  Musculoskeletal: No deformities, no cyanosis or clubbing  Neuro: alert, non focal  Skin: Warm, no lesions or rashes  No results found.        Assessment & Plan:   Moderate persistent asthma without complication Moderate persistent asthma with recent exacerbation now resolved Plan Maintain inhaled medications including Advair Prevnar 13 was given    Updated Medication List Outpatient Encounter Prescriptions as of 03/05/2014  Medication Sig  . acetaminophen (TYLENOL) 500 MG tablet Take 500 mg by mouth every 4 (four) hours as needed.    Marland Kitchen ADVAIR DISKUS 250-50 MCG/DOSE AEPB inhale 1 dose by mouth twice a day  . albuterol (PROVENTIL HFA;VENTOLIN HFA) 108 (90 BASE) MCG/ACT inhaler Inhale 2 puffs into the lungs every 6 (six) hours as needed.  Marland Kitchen amLODipine (NORVASC) 5 MG tablet Take 5 mg by mouth daily.    Marland Kitchen  cetirizine (ZYRTEC) 10 MG tablet Take 10 mg by mouth daily.  Marland Kitchen FLUoxetine (PROZAC) 20 MG capsule Take 40 mg by mouth daily.   . fluticasone (FLONASE) 50 MCG/ACT nasal spray PLACE 2 SPRAYS INTO THE  NOSE DAILY.  . hydrochlorothiazide 25 MG tablet Take 25 mg by mouth daily.    . montelukast (SINGULAIR) 10 MG tablet TAKE 1 TABLET (10 MG TOTAL) BY MOUTH DAILY.  Marland Kitchen olmesartan (BENICAR) 40 MG tablet Take 40 mg by mouth daily.    Marland Kitchen omalizumab (XOLAIR) 150 MG injection 300 mg every 2 weeks   . [DISCONTINUED] predniSONE (DELTASONE) 10 MG tablet 4 tabs for 2 days, 2 tabs for 2 days, then 1 tab for 2 days, then stop

## 2014-03-14 ENCOUNTER — Ambulatory Visit (INDEPENDENT_AMBULATORY_CARE_PROVIDER_SITE_OTHER): Payer: Medicare Other

## 2014-03-14 DIAGNOSIS — J45909 Unspecified asthma, uncomplicated: Secondary | ICD-10-CM

## 2014-03-14 DIAGNOSIS — J4540 Moderate persistent asthma, uncomplicated: Secondary | ICD-10-CM

## 2014-03-14 MED ORDER — OMALIZUMAB 150 MG ~~LOC~~ SOLR
300.0000 mg | Freq: Once | SUBCUTANEOUS | Status: AC
  Administered 2014-03-14: 300 mg via SUBCUTANEOUS

## 2014-03-28 ENCOUNTER — Ambulatory Visit (INDEPENDENT_AMBULATORY_CARE_PROVIDER_SITE_OTHER): Payer: Medicare Other

## 2014-03-28 DIAGNOSIS — J4540 Moderate persistent asthma, uncomplicated: Secondary | ICD-10-CM

## 2014-03-28 DIAGNOSIS — J45909 Unspecified asthma, uncomplicated: Secondary | ICD-10-CM | POA: Diagnosis not present

## 2014-03-29 MED ORDER — OMALIZUMAB 150 MG ~~LOC~~ SOLR
300.0000 mg | Freq: Once | SUBCUTANEOUS | Status: AC
  Administered 2014-03-29: 300 mg via SUBCUTANEOUS

## 2014-03-30 ENCOUNTER — Other Ambulatory Visit: Payer: Self-pay | Admitting: Critical Care Medicine

## 2014-04-11 ENCOUNTER — Ambulatory Visit (INDEPENDENT_AMBULATORY_CARE_PROVIDER_SITE_OTHER): Payer: Medicare Other

## 2014-04-11 DIAGNOSIS — J4540 Moderate persistent asthma, uncomplicated: Secondary | ICD-10-CM

## 2014-04-11 DIAGNOSIS — J45909 Unspecified asthma, uncomplicated: Secondary | ICD-10-CM | POA: Diagnosis not present

## 2014-04-18 MED ORDER — OMALIZUMAB 150 MG ~~LOC~~ SOLR
300.0000 mg | Freq: Once | SUBCUTANEOUS | Status: AC
  Administered 2014-04-18: 300 mg via SUBCUTANEOUS

## 2014-04-19 DIAGNOSIS — Z961 Presence of intraocular lens: Secondary | ICD-10-CM | POA: Diagnosis not present

## 2014-04-19 DIAGNOSIS — H04129 Dry eye syndrome of unspecified lacrimal gland: Secondary | ICD-10-CM | POA: Diagnosis not present

## 2014-04-19 DIAGNOSIS — H43819 Vitreous degeneration, unspecified eye: Secondary | ICD-10-CM | POA: Diagnosis not present

## 2014-04-25 ENCOUNTER — Ambulatory Visit (INDEPENDENT_AMBULATORY_CARE_PROVIDER_SITE_OTHER): Payer: Medicare Other

## 2014-04-25 DIAGNOSIS — J4540 Moderate persistent asthma, uncomplicated: Secondary | ICD-10-CM

## 2014-04-25 DIAGNOSIS — J45909 Unspecified asthma, uncomplicated: Secondary | ICD-10-CM

## 2014-04-27 MED ORDER — OMALIZUMAB 150 MG ~~LOC~~ SOLR
300.0000 mg | Freq: Once | SUBCUTANEOUS | Status: AC
  Administered 2014-04-27: 300 mg via SUBCUTANEOUS

## 2014-05-09 ENCOUNTER — Encounter: Payer: Self-pay | Admitting: Internal Medicine

## 2014-05-09 ENCOUNTER — Ambulatory Visit (INDEPENDENT_AMBULATORY_CARE_PROVIDER_SITE_OTHER): Payer: Medicare Other

## 2014-05-09 DIAGNOSIS — J4540 Moderate persistent asthma, uncomplicated: Secondary | ICD-10-CM

## 2014-05-14 DIAGNOSIS — Z1389 Encounter for screening for other disorder: Secondary | ICD-10-CM | POA: Diagnosis not present

## 2014-05-14 DIAGNOSIS — J45909 Unspecified asthma, uncomplicated: Secondary | ICD-10-CM | POA: Diagnosis not present

## 2014-05-14 DIAGNOSIS — Z23 Encounter for immunization: Secondary | ICD-10-CM | POA: Diagnosis not present

## 2014-05-14 DIAGNOSIS — J302 Other seasonal allergic rhinitis: Secondary | ICD-10-CM | POA: Diagnosis not present

## 2014-05-14 DIAGNOSIS — I10 Essential (primary) hypertension: Secondary | ICD-10-CM | POA: Diagnosis not present

## 2014-05-14 DIAGNOSIS — M810 Age-related osteoporosis without current pathological fracture: Secondary | ICD-10-CM | POA: Diagnosis not present

## 2014-05-14 DIAGNOSIS — Z79899 Other long term (current) drug therapy: Secondary | ICD-10-CM | POA: Diagnosis not present

## 2014-05-14 DIAGNOSIS — M1990 Unspecified osteoarthritis, unspecified site: Secondary | ICD-10-CM | POA: Diagnosis not present

## 2014-05-14 DIAGNOSIS — L509 Urticaria, unspecified: Secondary | ICD-10-CM | POA: Diagnosis not present

## 2014-05-14 DIAGNOSIS — N183 Chronic kidney disease, stage 3 (moderate): Secondary | ICD-10-CM | POA: Diagnosis not present

## 2014-05-15 ENCOUNTER — Other Ambulatory Visit: Payer: Self-pay | Admitting: Critical Care Medicine

## 2014-05-17 ENCOUNTER — Other Ambulatory Visit: Payer: Self-pay | Admitting: *Deleted

## 2014-05-17 MED ORDER — FLUTICASONE PROPIONATE 50 MCG/ACT NA SUSP: 2.0000 | Freq: Every day | NASAL | Status: DC

## 2014-05-23 ENCOUNTER — Ambulatory Visit (INDEPENDENT_AMBULATORY_CARE_PROVIDER_SITE_OTHER): Payer: Medicare Other

## 2014-05-23 DIAGNOSIS — J4540 Moderate persistent asthma, uncomplicated: Secondary | ICD-10-CM

## 2014-05-23 MED ORDER — OMALIZUMAB 150 MG ~~LOC~~ SOLR
300.0000 mg | Freq: Once | SUBCUTANEOUS | Status: AC
  Administered 2014-05-23: 300 mg via SUBCUTANEOUS

## 2014-06-06 ENCOUNTER — Ambulatory Visit: Payer: Medicare Other

## 2014-06-13 ENCOUNTER — Ambulatory Visit (INDEPENDENT_AMBULATORY_CARE_PROVIDER_SITE_OTHER): Payer: Medicare Other

## 2014-06-13 DIAGNOSIS — J4540 Moderate persistent asthma, uncomplicated: Secondary | ICD-10-CM | POA: Diagnosis not present

## 2014-06-20 MED ORDER — OMALIZUMAB 150 MG ~~LOC~~ SOLR
300.0000 mg | Freq: Once | SUBCUTANEOUS | Status: AC
  Administered 2014-06-13: 300 mg via SUBCUTANEOUS

## 2014-06-25 ENCOUNTER — Ambulatory Visit: Payer: Medicare Other

## 2014-06-27 ENCOUNTER — Ambulatory Visit: Payer: Medicare Other

## 2014-06-27 DIAGNOSIS — M1711 Unilateral primary osteoarthritis, right knee: Secondary | ICD-10-CM | POA: Diagnosis not present

## 2014-07-02 ENCOUNTER — Encounter: Payer: Self-pay | Admitting: Critical Care Medicine

## 2014-07-02 ENCOUNTER — Ambulatory Visit: Payer: Medicare Other

## 2014-07-02 ENCOUNTER — Ambulatory Visit (INDEPENDENT_AMBULATORY_CARE_PROVIDER_SITE_OTHER): Payer: Medicare Other | Admitting: Critical Care Medicine

## 2014-07-02 VITALS — BP 108/56 | HR 74 | Ht 63.5 in | Wt 130.8 lb

## 2014-07-02 DIAGNOSIS — J4540 Moderate persistent asthma, uncomplicated: Secondary | ICD-10-CM | POA: Diagnosis not present

## 2014-07-02 DIAGNOSIS — J209 Acute bronchitis, unspecified: Secondary | ICD-10-CM | POA: Diagnosis not present

## 2014-07-02 DIAGNOSIS — J4541 Moderate persistent asthma with (acute) exacerbation: Secondary | ICD-10-CM | POA: Diagnosis not present

## 2014-07-02 MED ORDER — AZITHROMYCIN 250 MG PO TABS: ORAL_TABLET | ORAL | Status: DC

## 2014-07-02 MED ORDER — OMALIZUMAB 150 MG ~~LOC~~ SOLR
300.0000 mg | Freq: Once | SUBCUTANEOUS | Status: AC
  Administered 2014-07-02: 300 mg via SUBCUTANEOUS

## 2014-07-02 MED ORDER — METHYLPREDNISOLONE ACETATE 80 MG/ML IJ SUSP
120.0000 mg | Freq: Once | INTRAMUSCULAR | Status: AC
  Administered 2014-07-02: 120 mg via INTRAMUSCULAR

## 2014-07-02 NOTE — Progress Notes (Signed)
Subjective:    Patient ID: [name], female    DOB: 14-Aug-1923, 80 y.o.   MRN: [id number]  HPI  07/02/2014 Chief Complaint  Patient presents with  . 4 month follow up    Wheezing and prod cough with yellow mucus x several wks.  No SOB, chest tightness/pain, or f/c/s.   Notes more cough and wheezing for several weeks.  Mucus is yellow.  No real dyspnea.  No real chest pain or fever.   No recent abx  Denies nocturnal complaints. Cough has been progressive.  Review of Systems Constitutional:   No  weight loss, night sweats,  Fevers, ++chills,no  fatigue, lassitude. HEENT:   No headaches,  Difficulty swallowing,  Tooth/dental problems,  Sore throat,                No sneezing, itching, ear ache, no nasal congestion, +++post nasal drip,   CV:  No chest pain,  Orthopnea, PND, swelling in lower extremities, anasarca, dizziness, palpitations  GI  No heartburn, indigestion, abdominal pain, nausea, vomiting, diarrhea, change in bowel habits, loss of appetite  Resp: Notes shortness of breath with exertion or  at rest.  No excess mucus, notes productive cough,  No non-productive cough,  No coughing up of blood.  Notes change in color of mucus.  No wheezing.  No chest wall deformity  Skin: no rash or lesions.  GU: no dysuria, change in color of urine, no urgency or frequency.  No flank pain.  MS:  No joint pain or swelling.  No decreased range of motion.  No back pain.  Psych:  No change in mood or affect. No depression or anxiety.  No memory loss.     Objective:   Physical Exam Filed Vitals:   07/02/14 0939  BP: 108/56  Pulse: 74  Height: 5' 3.5" (1.613 m)  Weight: 130 lb 12.8 oz (59.33 kg)  SpO2: 99%    Gen: Pleasant, well-nourished, in no distress,  normal affect  ENT: No lesions,  mouth clear,  oropharynx clear, no postnasal drip  Neck: No JVD, no TMG, no carotid bruits  Lungs: No use of accessory muscles, no dullness to percussion, expiratory wheezes    Cardiovascular: RRR, heart sounds normal, no murmur or gallops, no peripheral edema  Abdomen: soft and NT, no HSM,  BS normal  Musculoskeletal: No deformities, no cyanosis or clubbing  Neuro: alert, non focal  Skin: Warm, no lesions or rashes  No results found.        Assessment & Plan:   Moderate persistent asthma with acute bronchitis and acute exacerbation Moderate persistent asthma with acute tracheobronchitis and FLAIR Plan Depomedrol 120mg  injection was given Azithromycin 250mg  Take two once then one daily until gone , sent to pharmacy No change in medications Return 4 months, sooner if unimproved     Updated Medication List Outpatient Encounter Prescriptions as of 07/02/2014  Medication Sig  . acetaminophen (TYLENOL) 500 MG tablet Take 500 mg by mouth every 4 (four) hours as needed.    Marland Kitchen ADVAIR DISKUS 250-50 MCG/DOSE AEPB inhale 1 dose by mouth twice a day  . albuterol (PROVENTIL HFA;VENTOLIN HFA) 108 (90 BASE) MCG/ACT inhaler Inhale 2 puffs into the lungs every 6 (six) hours as needed.  Marland Kitchen amLODipine (NORVASC) 5 MG tablet Take 5 mg by mouth daily.    . cetirizine (ZYRTEC) 10 MG tablet Take 10 mg by mouth daily.  Marland Kitchen FLUoxetine (PROZAC) 20 MG capsule Take 40 mg by mouth daily.   Marland Kitchen  fluticasone (FLONASE) 50 MCG/ACT nasal spray Place 2 sprays into both nostrils daily.  . hydrochlorothiazide 25 MG tablet Take 25 mg by mouth daily.    . montelukast (SINGULAIR) 10 MG tablet TAKE 1 TABLET (10 MG     TOTAL) BY MOUTH DAILY.  Marland Kitchen olmesartan (BENICAR) 40 MG tablet Take 40 mg by mouth daily.    . pseudoephedrine-guaifenesin (MUCINEX D) 60-600 MG per tablet Take 1 tablet by mouth every 12 (twelve) hours.  Marland Kitchen azithromycin (ZITHROMAX) 250 MG tablet Take two once then one daily until gone  . [EXPIRED] methylPREDNISolone acetate (DEPO-MEDROL) injection 120 mg

## 2014-07-02 NOTE — Assessment & Plan Note (Signed)
Moderate persistent asthma with acute tracheobronchitis and FLAIR Plan Depomedrol 120mg  injection was given Azithromycin 250mg  Take two once then one daily until gone , sent to pharmacy No change in medications Return 4 months, sooner if unimproved

## 2014-07-02 NOTE — Addendum Note (Signed)
Addended by: Desmond Dike C on: 07/02/2014 02:05 PM   Modules accepted: Orders

## 2014-07-02 NOTE — Patient Instructions (Signed)
Depomedrol 120mg  injection was given Azithromycin 250mg  Take two once then one daily until gone , sent to pharmacy No change in medications Return 4 months, sooner if unimproved

## 2014-07-04 DIAGNOSIS — M1711 Unilateral primary osteoarthritis, right knee: Secondary | ICD-10-CM | POA: Diagnosis not present

## 2014-07-11 DIAGNOSIS — M1711 Unilateral primary osteoarthritis, right knee: Secondary | ICD-10-CM | POA: Diagnosis not present

## 2014-07-16 ENCOUNTER — Ambulatory Visit (INDEPENDENT_AMBULATORY_CARE_PROVIDER_SITE_OTHER): Payer: Medicare Other

## 2014-07-16 DIAGNOSIS — J4520 Mild intermittent asthma, uncomplicated: Secondary | ICD-10-CM | POA: Diagnosis not present

## 2014-07-17 MED ORDER — OMALIZUMAB 150 MG ~~LOC~~ SOLR
300.0000 mg | Freq: Once | SUBCUTANEOUS | Status: AC
  Administered 2014-07-17: 300 mg via SUBCUTANEOUS

## 2014-07-30 ENCOUNTER — Ambulatory Visit (INDEPENDENT_AMBULATORY_CARE_PROVIDER_SITE_OTHER): Payer: Medicare Other

## 2014-07-30 DIAGNOSIS — J4520 Mild intermittent asthma, uncomplicated: Secondary | ICD-10-CM | POA: Diagnosis not present

## 2014-07-30 MED ORDER — OMALIZUMAB 150 MG ~~LOC~~ SOLR
300.0000 mg | Freq: Once | SUBCUTANEOUS | Status: AC
  Administered 2014-07-30: 300 mg via SUBCUTANEOUS

## 2014-08-13 ENCOUNTER — Ambulatory Visit (INDEPENDENT_AMBULATORY_CARE_PROVIDER_SITE_OTHER): Payer: Medicare Other

## 2014-08-13 DIAGNOSIS — J4541 Moderate persistent asthma with (acute) exacerbation: Secondary | ICD-10-CM

## 2014-08-13 DIAGNOSIS — J209 Acute bronchitis, unspecified: Secondary | ICD-10-CM | POA: Diagnosis not present

## 2014-08-16 MED ORDER — OMALIZUMAB 150 MG ~~LOC~~ SOLR
300.0000 mg | Freq: Once | SUBCUTANEOUS | Status: AC
  Administered 2014-08-13: 300 mg via SUBCUTANEOUS

## 2014-08-27 ENCOUNTER — Ambulatory Visit: Payer: Medicare Other

## 2014-08-29 ENCOUNTER — Ambulatory Visit (INDEPENDENT_AMBULATORY_CARE_PROVIDER_SITE_OTHER): Payer: Medicare Other

## 2014-08-29 DIAGNOSIS — D225 Melanocytic nevi of trunk: Secondary | ICD-10-CM | POA: Diagnosis not present

## 2014-08-29 DIAGNOSIS — J209 Acute bronchitis, unspecified: Secondary | ICD-10-CM

## 2014-08-29 DIAGNOSIS — J4541 Moderate persistent asthma with (acute) exacerbation: Secondary | ICD-10-CM | POA: Diagnosis not present

## 2014-08-29 DIAGNOSIS — L821 Other seborrheic keratosis: Secondary | ICD-10-CM | POA: Diagnosis not present

## 2014-08-29 DIAGNOSIS — Z85828 Personal history of other malignant neoplasm of skin: Secondary | ICD-10-CM | POA: Diagnosis not present

## 2014-08-29 DIAGNOSIS — D692 Other nonthrombocytopenic purpura: Secondary | ICD-10-CM | POA: Diagnosis not present

## 2014-08-29 DIAGNOSIS — D2262 Melanocytic nevi of left upper limb, including shoulder: Secondary | ICD-10-CM | POA: Diagnosis not present

## 2014-08-31 MED ORDER — OMALIZUMAB 150 MG ~~LOC~~ SOLR
300.0000 mg | Freq: Once | SUBCUTANEOUS | Status: AC
  Administered 2014-08-29: 300 mg via SUBCUTANEOUS

## 2014-09-12 ENCOUNTER — Ambulatory Visit: Payer: Medicare Other

## 2014-09-14 ENCOUNTER — Ambulatory Visit (INDEPENDENT_AMBULATORY_CARE_PROVIDER_SITE_OTHER): Payer: Medicare Other

## 2014-09-14 DIAGNOSIS — J4541 Moderate persistent asthma with (acute) exacerbation: Secondary | ICD-10-CM

## 2014-09-14 DIAGNOSIS — J209 Acute bronchitis, unspecified: Secondary | ICD-10-CM

## 2014-09-20 MED ORDER — OMALIZUMAB 150 MG ~~LOC~~ SOLR
300.0000 mg | Freq: Once | SUBCUTANEOUS | Status: AC
  Administered 2014-09-14: 300 mg via SUBCUTANEOUS

## 2014-09-28 ENCOUNTER — Ambulatory Visit: Payer: Medicare Other

## 2014-10-01 ENCOUNTER — Ambulatory Visit: Payer: Medicare Other

## 2014-10-01 ENCOUNTER — Encounter: Payer: Self-pay | Admitting: Adult Health

## 2014-10-01 ENCOUNTER — Ambulatory Visit (INDEPENDENT_AMBULATORY_CARE_PROVIDER_SITE_OTHER): Payer: Medicare Other | Admitting: Adult Health

## 2014-10-01 VITALS — BP 110/70 | HR 70 | Temp 98.0°F | Ht 63.0 in | Wt 130.4 lb

## 2014-10-01 DIAGNOSIS — J209 Acute bronchitis, unspecified: Secondary | ICD-10-CM

## 2014-10-01 DIAGNOSIS — J4541 Moderate persistent asthma with (acute) exacerbation: Secondary | ICD-10-CM

## 2014-10-01 MED ORDER — PREDNISONE 10 MG PO TABS: ORAL_TABLET | ORAL | Status: DC

## 2014-10-01 MED ORDER — LEVALBUTEROL HCL 0.63 MG/3ML IN NEBU
0.6300 mg | INHALATION_SOLUTION | Freq: Once | RESPIRATORY_TRACT | Status: AC
  Administered 2014-10-01: 0.63 mg via RESPIRATORY_TRACT

## 2014-10-01 NOTE — Assessment & Plan Note (Signed)
Mild flare with URI/AR  xopenex neb x 1   Plan Prednisone taper over next week.  Mucinex DM twice daily as needed. For cough, congestion. Fluids and rest. Follow Dr. Joya Gaskins as planned. As needed. Please contact office for sooner follow up if symptoms do not improve or worsen or seek emergency care

## 2014-10-01 NOTE — Addendum Note (Signed)
Addended by: Parke Poisson E on: 10/01/2014 12:38 PM   Modules accepted: Orders

## 2014-10-01 NOTE — Progress Notes (Signed)
  Subjective:    Patient ID: [name], female    DOB: 1923/12/17, 81 y.o.   MRN: [id number]  HPI  This is a 81 y.o.  white female, history of asthmatic bronchitis, severe, persistent. Severe Atopy on Xolair.   10/01/2014 Acute OV  Complains of1- 2 days wheezing, prouctive cough with yellow mucus Deneies fever, chills, sweats. States feeling well today just wheezing more. Was suppose to get Xolair today and did not want to get if asthma is flaring.  She has been using Mucinex. She denies any hemoptysis, orthopnea, PND, or increased leg swelling.  She is any recent travel or antibiotic use.  Review of Systems  Constitutional:   No  weight loss, night sweats,  Fevers, chills, fatigue, lassitude. HEENT:   No headaches,  Difficulty swallowing,  Tooth/dental problems,  Sore throat,                No sneezing, itching, ear ache, nasal congestion, post nasal drip,   CV:  No chest pain,  Orthopnea, PND, swelling in lower extremities, anasarca, dizziness, palpitations  GI  No heartburn, indigestion, abdominal pain, nausea, vomiting, diarrhea, change in bowel habits, loss of appetite  Resp:   No chest wall deformity  Skin: no rash or lesions.  GU: no dysuria, change in color of urine, no urgency or frequency.  No flank pain.  MS:  No joint pain or swelling.  No decreased range of motion.  No back pain.  Psych:  No change in mood or affect. No depression or anxiety.  No memory loss.     Objective:   Physical Exam  Gen: Pleasant, well-nourished, in no distress,  normal affect   ENT: No lesions,  mouth clear,  oropharynx clear, + postnasal drip, mild nasal inflammation  Neck: No JVD, no TMG, no carotid bruits  Lungs: No use of accessory muscles, no dullness to percussion, exp wheezing   Cardiovascular: RRR, heart sounds normal, no murmur or gallops, no peripheral edema  Abdomen: soft and NT, no HSM,  BS normal  Musculoskeletal: No deformities, no cyanosis or clubbing  Neuro:  alert, non focal  Skin: Warm, no lesions or rash     Assessment & Plan:

## 2014-10-01 NOTE — Patient Instructions (Signed)
Prednisone taper over next week.  Mucinex DM twice daily as needed. For cough, congestion. Fluids and rest. Follow Dr. Joya Gaskins as planned. As needed. Please contact office for sooner follow up if symptoms do not improve or worsen or seek emergency care

## 2014-10-05 DIAGNOSIS — Z6823 Body mass index (BMI) 23.0-23.9, adult: Secondary | ICD-10-CM | POA: Diagnosis not present

## 2014-10-05 DIAGNOSIS — L089 Local infection of the skin and subcutaneous tissue, unspecified: Secondary | ICD-10-CM | POA: Diagnosis not present

## 2014-10-12 ENCOUNTER — Ambulatory Visit (INDEPENDENT_AMBULATORY_CARE_PROVIDER_SITE_OTHER): Payer: Medicare Other

## 2014-10-12 DIAGNOSIS — J4540 Moderate persistent asthma, uncomplicated: Secondary | ICD-10-CM | POA: Diagnosis not present

## 2014-10-12 MED ORDER — OMALIZUMAB 150 MG ~~LOC~~ SOLR
300.0000 mg | Freq: Once | SUBCUTANEOUS | Status: AC
  Administered 2014-10-12: 300 mg via SUBCUTANEOUS

## 2014-10-15 ENCOUNTER — Ambulatory Visit: Payer: Medicare Other

## 2014-10-29 ENCOUNTER — Ambulatory Visit (INDEPENDENT_AMBULATORY_CARE_PROVIDER_SITE_OTHER): Payer: Medicare Other

## 2014-10-29 DIAGNOSIS — J4540 Moderate persistent asthma, uncomplicated: Secondary | ICD-10-CM | POA: Diagnosis not present

## 2014-10-29 MED ORDER — OMALIZUMAB 150 MG ~~LOC~~ SOLR
300.0000 mg | Freq: Once | SUBCUTANEOUS | Status: AC
  Administered 2014-10-29: 300 mg via SUBCUTANEOUS

## 2014-11-05 ENCOUNTER — Ambulatory Visit (INDEPENDENT_AMBULATORY_CARE_PROVIDER_SITE_OTHER): Payer: Medicare Other | Admitting: Critical Care Medicine

## 2014-11-05 ENCOUNTER — Encounter: Payer: Self-pay | Admitting: Critical Care Medicine

## 2014-11-05 VITALS — BP 120/64 | HR 64 | Temp 98.4°F | Ht 63.0 in | Wt 127.0 lb

## 2014-11-05 DIAGNOSIS — J209 Acute bronchitis, unspecified: Secondary | ICD-10-CM | POA: Diagnosis not present

## 2014-11-05 DIAGNOSIS — J4541 Moderate persistent asthma with (acute) exacerbation: Secondary | ICD-10-CM | POA: Diagnosis not present

## 2014-11-05 MED ORDER — PREDNISONE 10 MG PO TABS: ORAL_TABLET | ORAL | Status: DC

## 2014-11-05 NOTE — Assessment & Plan Note (Signed)
Moderate persistent asthma with exacerbation Plan Pred pulse No change inhaled meds

## 2014-11-05 NOTE — Patient Instructions (Signed)
Take prednisone 10mg  Take 4 for three days 3 for three days 2 for three days 1 for three days and stop No change in inhalers Return 1 month with tammy parrett See Dr Joya Gaskins in 4 months

## 2014-11-05 NOTE — Progress Notes (Signed)
Subjective:    Patient ID: [name], female    DOB: 1924-07-23, 81 y.o.   MRN: [id number]  HPI  This is a 81 y.o.  white female, history of asthmatic bronchitis, severe, persistent. Severe Atopy on Xolair.   11/05/2014 Chief Complaint  Patient presents with  . Follow-up    PT c/o wheezing and a prod cough with yellow mucus. Pt densies any chest tightness/'congetion, SOB.    Rx pred at last ov 2/29.  Now for one week, more symptoms.  Notes more wheezing, prod yellow mucus.  Notes some pndrip.  No chest tightness  PUL ASTHMA HISTORY 11/05/2014 06/07/2013 12/07/2012 07/06/2012 05/25/2012 02/24/2012 10/23/2011  Symptoms Throughout the day 0-2 days/week Daily 0-2 days/week Daily >2 days/week 0-2 days/week  Nighttime awakenings 0-2/month 0-2/month 0-2/month 0-2/month 0-2/month 0-2/month 0-2/month  Interference with activity No limitations No limitations No limitations No limitations Some limitations No limitations Minor limitations  SABA use Several times/day 0-2 days/wk Daily 0-2 days/wk 0-2 days/wk 0-2 days/wk 0-2 days/wk  Exacerbations requiring oral steroids 2 or more / year 0-1 / year 0-1 / year 0-1 / year 0-1 / year 0-1 / year 0-1 / year    Review of Systems  Constitutional:   No  weight loss, night sweats,  Fevers, chills, fatigue, lassitude. HEENT:   No headaches,  Difficulty swallowing,  Tooth/dental problems,  Sore throat,                No sneezing, itching, ear ache, nasal congestion, post nasal drip,   CV:  No chest pain,  Orthopnea, PND, swelling in lower extremities, anasarca, dizziness, palpitations  GI  No heartburn, indigestion, abdominal pain, nausea, vomiting, diarrhea, change in bowel habits, loss of appetite  Resp:   No chest wall deformity  Skin: no rash or lesions.  GU: no dysuria, change in color of urine, no urgency or frequency.  No flank pain.  MS:  No joint pain or swelling.  No decreased range of motion.  No back pain.  Psych:  No change in mood or  affect. No depression or anxiety.  No memory loss.     Objective:   Physical Exam BP 120/64 mmHg  Pulse 64  Temp(Src) 98.4 F (36.9 C) (Oral)  Ht 5\' 3"  (1.6 m)  Wt 127 lb (57.607 kg)  BMI 22.50 kg/m2  SpO2 98%  Gen: Pleasant, well-nourished, in no distress,  normal affect   ENT: No lesions,  mouth clear,  oropharynx clear, + postnasal drip, mild nasal inflammation  Neck: No JVD, no TMG, no carotid bruits  Lungs: No use of accessory muscles, no dullness to percussion, exp wheezing   Cardiovascular: RRR, heart sounds normal, no murmur or gallops, no peripheral edema  Abdomen: soft and NT, no HSM,  BS normal  Musculoskeletal: No deformities, no cyanosis or clubbing  Neuro: alert, non focal  Skin: Warm, no lesions or rash     Assessment & Plan:   Moderate persistent asthma with acute bronchitis and acute exacerbation  Moderate persistent asthma with exacerbation Plan Pred pulse No change inhaled meds    Updated Medication List Outpatient Encounter Prescriptions as of 11/05/2014  Medication Sig  . acetaminophen (TYLENOL) 500 MG tablet Take 500 mg by mouth every 4 (four) hours as needed.    Marland Kitchen albuterol (PROVENTIL HFA;VENTOLIN HFA) 108 (90 BASE) MCG/ACT inhaler Inhale 2 puffs into the lungs every 6 (six) hours as needed.  Marland Kitchen amLODipine (NORVASC) 5 MG tablet Take 5 mg by mouth daily.    Marland Kitchen  cetirizine (ZYRTEC) 10 MG tablet Take 10 mg by mouth daily.  Marland Kitchen FLUoxetine (PROZAC) 20 MG capsule Take 40 mg by mouth daily.   . fluticasone (FLONASE) 50 MCG/ACT nasal spray Place 2 sprays into both nostrils daily. (Patient taking differently: Place 2 sprays into both nostrils 2 (two) times daily. )  . hydrochlorothiazide 25 MG tablet Take 25 mg by mouth daily.    . montelukast (SINGULAIR) 10 MG tablet TAKE 1 TABLET (10 MG     TOTAL) BY MOUTH DAILY.  Marland Kitchen olmesartan (BENICAR) 40 MG tablet Take 40 mg by mouth daily.    . pseudoephedrine-guaifenesin (MUCINEX D) 60-600 MG per tablet Take 1  tablet by mouth every 12 (twelve) hours as needed.   Marland Kitchen ADVAIR DISKUS 250-50 MCG/DOSE AEPB inhale 1 dose by mouth twice a day  . predniSONE (DELTASONE) 10 MG tablet Take 4 for three days 3 for three days 2 for three days 1 for three days and stop  . [DISCONTINUED] azithromycin (ZITHROMAX) 250 MG tablet Take two once then one daily until gone (Patient not taking: Reported on 11/05/2014)  . [DISCONTINUED] predniSONE (DELTASONE) 10 MG tablet 3 tabs for 2 days, 2 tabs for 2 days, then 1 tab for 2 days, then stop (Patient not taking: Reported on 11/05/2014)

## 2014-11-12 ENCOUNTER — Ambulatory Visit (INDEPENDENT_AMBULATORY_CARE_PROVIDER_SITE_OTHER): Payer: Medicare Other

## 2014-11-12 DIAGNOSIS — J4541 Moderate persistent asthma with (acute) exacerbation: Secondary | ICD-10-CM | POA: Diagnosis not present

## 2014-11-12 DIAGNOSIS — J209 Acute bronchitis, unspecified: Secondary | ICD-10-CM

## 2014-11-12 MED ORDER — OMALIZUMAB 150 MG ~~LOC~~ SOLR
300.0000 mg | Freq: Once | SUBCUTANEOUS | Status: AC
  Administered 2014-11-12: 300 mg via SUBCUTANEOUS

## 2014-11-14 DIAGNOSIS — J302 Other seasonal allergic rhinitis: Secondary | ICD-10-CM | POA: Diagnosis not present

## 2014-11-14 DIAGNOSIS — J45909 Unspecified asthma, uncomplicated: Secondary | ICD-10-CM | POA: Diagnosis not present

## 2014-11-14 DIAGNOSIS — N183 Chronic kidney disease, stage 3 (moderate): Secondary | ICD-10-CM | POA: Diagnosis not present

## 2014-11-14 DIAGNOSIS — R159 Full incontinence of feces: Secondary | ICD-10-CM | POA: Diagnosis not present

## 2014-11-14 DIAGNOSIS — F329 Major depressive disorder, single episode, unspecified: Secondary | ICD-10-CM | POA: Diagnosis not present

## 2014-11-14 DIAGNOSIS — R32 Unspecified urinary incontinence: Secondary | ICD-10-CM | POA: Diagnosis not present

## 2014-11-14 DIAGNOSIS — L509 Urticaria, unspecified: Secondary | ICD-10-CM | POA: Diagnosis not present

## 2014-11-14 DIAGNOSIS — M810 Age-related osteoporosis without current pathological fracture: Secondary | ICD-10-CM | POA: Diagnosis not present

## 2014-11-14 DIAGNOSIS — I10 Essential (primary) hypertension: Secondary | ICD-10-CM | POA: Diagnosis not present

## 2014-11-14 DIAGNOSIS — Z5181 Encounter for therapeutic drug level monitoring: Secondary | ICD-10-CM | POA: Diagnosis not present

## 2014-11-14 DIAGNOSIS — Z6823 Body mass index (BMI) 23.0-23.9, adult: Secondary | ICD-10-CM | POA: Diagnosis not present

## 2014-11-14 DIAGNOSIS — M2550 Pain in unspecified joint: Secondary | ICD-10-CM | POA: Diagnosis not present

## 2014-11-26 ENCOUNTER — Ambulatory Visit (INDEPENDENT_AMBULATORY_CARE_PROVIDER_SITE_OTHER): Payer: Medicare Other

## 2014-11-26 DIAGNOSIS — J45909 Unspecified asthma, uncomplicated: Secondary | ICD-10-CM | POA: Diagnosis not present

## 2014-11-26 MED ORDER — OMALIZUMAB 150 MG ~~LOC~~ SOLR
300.0000 mg | Freq: Once | SUBCUTANEOUS | Status: AC
  Administered 2014-11-26: 300 mg via SUBCUTANEOUS

## 2014-12-05 ENCOUNTER — Encounter: Payer: Self-pay | Admitting: Adult Health

## 2014-12-05 ENCOUNTER — Ambulatory Visit (INDEPENDENT_AMBULATORY_CARE_PROVIDER_SITE_OTHER): Payer: Medicare Other | Admitting: Adult Health

## 2014-12-05 VITALS — BP 130/70 | HR 79 | Temp 98.1°F | Ht 63.0 in | Wt 130.6 lb

## 2014-12-05 DIAGNOSIS — J209 Acute bronchitis, unspecified: Secondary | ICD-10-CM

## 2014-12-05 DIAGNOSIS — J301 Allergic rhinitis due to pollen: Secondary | ICD-10-CM | POA: Diagnosis not present

## 2014-12-05 DIAGNOSIS — J4541 Moderate persistent asthma with (acute) exacerbation: Secondary | ICD-10-CM

## 2014-12-05 NOTE — Progress Notes (Signed)
  Subjective:    Patient ID: [name], female    DOB: 10-25-1923, 81 y.o.   MRN: [id number]  HPI  This is a 81 y.o.  white female, history of asthmatic bronchitis, severe, persistent. Severe Atopy   12/05/2014 Follow up  Pt returns for  A 1 month follow up  Seen last month for asthma exacerbation sHe was given a prednisone taper. She says that she is feeling much better Had resolution of her cough and wheezing. She still has some occasional drainage. She remains on Flonase, Zyrtec and Singulair. She denies any chest pain, orthopnea, PND or leg swelling   Review of Systems  Constitutional:   No  weight loss, night sweats,  Fevers, chills, fatigue, lassitude. HEENT:   No headaches,  Difficulty swallowing,  Tooth/dental problems,  Sore throat,                No sneezing, itching, ear ache, nasal congestion, post nasal drip,   CV:  No chest pain,  Orthopnea, PND, swelling in lower extremities, anasarca, dizziness, palpitations  GI  No heartburn, indigestion, abdominal pain, nausea, vomiting, diarrhea, change in bowel habits, loss of appetite  Resp:   No chest wall deformity  Skin: no rash or lesions.  GU: no dysuria, change in color of urine, no urgency or frequency.  No flank pain.  MS:  No joint pain or swelling.  No decreased range of motion.  No back pain.  Psych:  No change in mood or affect. No depression or anxiety.  No memory loss.     Objective:   Physical Exam  Gen: Pleasant, well-nourished, in no distress,  normal affect   ENT: No lesions,  mouth clear,  oropharynx clear,   Neck: No JVD, no TMG, no carotid bruits  Lungs: No use of accessory muscles, no dullness to percussion, decreased BS in bases   Cardiovascular: RRR, heart sounds normal, no murmur or gallops, no peripheral edema  Abdomen: soft and NT, no HSM,  BS normal  Musculoskeletal: No deformities, no cyanosis or clubbing  Neuro: alert, non focal  Skin: Warm, no lesions or rash      Assessment & Plan:

## 2014-12-05 NOTE — Assessment & Plan Note (Signed)
Flare resolving   Plan Continue on current regimen   Follow up Dr. Joya Gaskins  As planned in 3 months

## 2014-12-05 NOTE — Patient Instructions (Signed)
Continue on current regimen   Follow up Dr. Joya Gaskins  As planned in 3 months

## 2014-12-05 NOTE — Assessment & Plan Note (Signed)
Recent exacerbation, now resolved  Plan Continue on current regimen   Follow up Dr. Joya Gaskins  As planned in 3 months

## 2014-12-10 ENCOUNTER — Ambulatory Visit (INDEPENDENT_AMBULATORY_CARE_PROVIDER_SITE_OTHER): Payer: Medicare Other

## 2014-12-10 DIAGNOSIS — J4540 Moderate persistent asthma, uncomplicated: Secondary | ICD-10-CM

## 2014-12-10 MED ORDER — OMALIZUMAB 150 MG ~~LOC~~ SOLR
300.0000 mg | Freq: Once | SUBCUTANEOUS | Status: AC
  Administered 2014-12-10: 300 mg via SUBCUTANEOUS

## 2014-12-24 ENCOUNTER — Ambulatory Visit (INDEPENDENT_AMBULATORY_CARE_PROVIDER_SITE_OTHER): Payer: Medicare Other

## 2014-12-24 DIAGNOSIS — J4540 Moderate persistent asthma, uncomplicated: Secondary | ICD-10-CM

## 2014-12-24 MED ORDER — OMALIZUMAB 150 MG ~~LOC~~ SOLR
300.0000 mg | Freq: Once | SUBCUTANEOUS | Status: AC
  Administered 2014-12-24: 300 mg via SUBCUTANEOUS

## 2014-12-26 ENCOUNTER — Telehealth: Payer: Self-pay | Admitting: Critical Care Medicine

## 2014-12-28 NOTE — Telephone Encounter (Signed)
#   vials:4 Ordered date:12/26/14 Shipping Date:12/26/14   # Vials:4 Arrival Date:12/27/14 Lot [id number] Exp Date:12/19

## 2015-01-07 ENCOUNTER — Ambulatory Visit (INDEPENDENT_AMBULATORY_CARE_PROVIDER_SITE_OTHER): Payer: Medicare Other

## 2015-01-07 DIAGNOSIS — J4540 Moderate persistent asthma, uncomplicated: Secondary | ICD-10-CM

## 2015-01-07 MED ORDER — OMALIZUMAB 150 MG ~~LOC~~ SOLR
300.0000 mg | Freq: Once | SUBCUTANEOUS | Status: AC
  Administered 2015-01-07: 300 mg via SUBCUTANEOUS

## 2015-01-21 ENCOUNTER — Ambulatory Visit: Payer: Medicare Other | Admitting: Adult Health

## 2015-01-21 ENCOUNTER — Encounter: Payer: Self-pay | Admitting: Adult Health

## 2015-01-21 ENCOUNTER — Ambulatory Visit (INDEPENDENT_AMBULATORY_CARE_PROVIDER_SITE_OTHER): Payer: Medicare Other | Admitting: Adult Health

## 2015-01-21 ENCOUNTER — Ambulatory Visit: Payer: Medicare Other

## 2015-01-21 VITALS — BP 114/64 | HR 71 | Temp 98.2°F | Ht 63.0 in | Wt 129.8 lb

## 2015-01-21 DIAGNOSIS — J209 Acute bronchitis, unspecified: Secondary | ICD-10-CM | POA: Diagnosis not present

## 2015-01-21 DIAGNOSIS — J4541 Moderate persistent asthma with (acute) exacerbation: Secondary | ICD-10-CM

## 2015-01-21 MED ORDER — AZITHROMYCIN 250 MG PO TABS: ORAL_TABLET | ORAL | Status: AC

## 2015-01-21 MED ORDER — PREDNISONE 10 MG PO TABS: ORAL_TABLET | ORAL | Status: DC

## 2015-01-21 MED ORDER — LEVALBUTEROL HCL 0.63 MG/3ML IN NEBU
0.6300 mg | INHALATION_SOLUTION | Freq: Once | RESPIRATORY_TRACT | Status: AC
  Administered 2015-01-21: 0.63 mg via RESPIRATORY_TRACT

## 2015-01-21 NOTE — Patient Instructions (Addendum)
Z-Pak take as directed, take with food. Mucinex DM twice daily as needed. For cough, congestion. Prednisone taper over next week. Follow Dr. Joya Gaskins as planned. As needed. Please contact office for sooner follow up if symptoms do not improve or worsen or seek emergency care

## 2015-01-21 NOTE — Progress Notes (Signed)
  Subjective:    Patient ID: [name], female    DOB: 10/15/1923, 81 y.o.   MRN: [id number]  HPI This is a 81 y.o.  white female, history of asthmatic bronchitis, severe, persistent. Severe Atopy   01/21/2015 Acute OV  Patient presents from [facility] with her caregiver. Patient presents for an acute office visit . She complains of wheezing, prod cough with yellow mucus x1 week with occasional tightness.  denies any f/c/s, n/v/d, hemoptysis. Patient has been using Mucinex without much relief.    Review of Systems Constitutional:   No  weight loss, night sweats,  Fevers, chills, fatigue, lassitude. HEENT:   No headaches,  Difficulty swallowing,  Tooth/dental problems,  Sore throat,                No sneezing, itching, ear ache,  +nasal congestion, post nasal drip,   CV:  No chest pain,  Orthopnea, PND, swelling in lower extremities, anasarca, dizziness, palpitations  GI  No heartburn, indigestion, abdominal pain, nausea, vomiting, diarrhea, change in bowel habits, loss of appetite  Resp:   No chest wall deformity  Skin: no rash or lesions.  GU: no dysuria, change in color of urine, no urgency or frequency.  No flank pain.  MS:  No joint pain or swelling.  No decreased range of motion.  No back pain.  Psych:  No change in mood or affect. No depression or anxiety.  No memory loss.     Objective:   Physical Exam  Gen: Pleasant, well-nourished, in no distress,  normal affect, Elderly, appears younger than stated age   ENT: No lesions,  mouth clear,  oropharynx clear,   Neck: No JVD, no TMG, no carotid bruits  Lungs: Few expiratory wheezes  Cardiovascular: RRR, heart sounds normal, no murmur or gallops, no peripheral edema  Abdomen: soft and NT, no HSM,  BS normal  Musculoskeletal: No deformities, no cyanosis or clubbing  Neuro: alert, non focal  Skin: Warm, no lesions or rash     Assessment & Plan:

## 2015-01-21 NOTE — Assessment & Plan Note (Addendum)
Exacerbation Xopenex neb given in office.  Plan  Z-Pak take as directed, take with food. Mucinex DM twice daily as needed. For cough, congestion. Prednisone taper over next week. Follow Dr. Joya Gaskins as planned. As needed. Please contact office for sooner follow up if symptoms do not improve or worsen or seek emergency care

## 2015-01-30 DIAGNOSIS — M1711 Unilateral primary osteoarthritis, right knee: Secondary | ICD-10-CM | POA: Diagnosis not present

## 2015-02-01 ENCOUNTER — Ambulatory Visit (INDEPENDENT_AMBULATORY_CARE_PROVIDER_SITE_OTHER): Payer: Medicare Other

## 2015-02-01 DIAGNOSIS — J4540 Moderate persistent asthma, uncomplicated: Secondary | ICD-10-CM | POA: Diagnosis not present

## 2015-02-01 MED ORDER — OMALIZUMAB 150 MG ~~LOC~~ SOLR
300.0000 mg | Freq: Once | SUBCUTANEOUS | Status: AC
  Administered 2015-02-01: 300 mg via SUBCUTANEOUS

## 2015-02-06 ENCOUNTER — Telehealth: Payer: Self-pay | Admitting: Internal Medicine

## 2015-02-06 ENCOUNTER — Other Ambulatory Visit: Payer: Self-pay | Admitting: Critical Care Medicine

## 2015-02-06 NOTE — Telephone Encounter (Signed)
#   vials:4 Ordered date:02/06/15 Shipping Date:02/07/15

## 2015-02-07 NOTE — Telephone Encounter (Signed)
#   Vials:4 Arrival Date:02/07/2015 Lot [id number] Exp Date:09/2018

## 2015-02-08 DIAGNOSIS — M1711 Unilateral primary osteoarthritis, right knee: Secondary | ICD-10-CM | POA: Diagnosis not present

## 2015-02-15 ENCOUNTER — Ambulatory Visit (INDEPENDENT_AMBULATORY_CARE_PROVIDER_SITE_OTHER): Payer: Medicare Other

## 2015-02-15 DIAGNOSIS — M1711 Unilateral primary osteoarthritis, right knee: Secondary | ICD-10-CM | POA: Diagnosis not present

## 2015-02-15 DIAGNOSIS — J4540 Moderate persistent asthma, uncomplicated: Secondary | ICD-10-CM | POA: Diagnosis not present

## 2015-02-18 MED ORDER — OMALIZUMAB 150 MG ~~LOC~~ SOLR
300.0000 mg | Freq: Once | SUBCUTANEOUS | Status: AC
  Administered 2015-02-15: 300 mg via SUBCUTANEOUS

## 2015-02-28 ENCOUNTER — Ambulatory Visit: Payer: Medicare Other | Admitting: Pulmonary Disease

## 2015-03-01 ENCOUNTER — Ambulatory Visit: Payer: Medicare Other

## 2015-03-04 ENCOUNTER — Telehealth: Payer: Self-pay | Admitting: Critical Care Medicine

## 2015-03-04 MED ORDER — AZITHROMYCIN 250 MG PO TABS: 250.0000 mg | ORAL_TABLET | ORAL | Status: DC

## 2015-03-04 MED ORDER — PREDNISONE 10 MG PO TABS: ORAL_TABLET | ORAL | Status: DC

## 2015-03-04 NOTE — Telephone Encounter (Signed)
Call in azithromycin 250mg  Take two once then one daily until gone #6 Call in prednisone 10mg  Take 4 for two days three for two days two for two days one for two days #20

## 2015-03-04 NOTE — Telephone Encounter (Signed)
Called and spoke to pt. Pt c/o increase in SOB, non prod cough, wheezing, and little chest congestion x 1 week. Pt denies CP/tightness, f/c/s, swelling. Pt stated she is taking mucinex with some relief. Pt requesting recs.   Dr. Joya Gaskins please advise. Thanks.   Allergies  Allergen Reactions  . Aspirin   . Sulfonamide Derivatives

## 2015-03-04 NOTE — Telephone Encounter (Signed)
Spoke with the pt and notified of recs per PW  She verbalized understanding  Rxs were sent to pharm  She will call if not improving

## 2015-03-11 ENCOUNTER — Ambulatory Visit (INDEPENDENT_AMBULATORY_CARE_PROVIDER_SITE_OTHER): Payer: Medicare Other

## 2015-03-11 DIAGNOSIS — J4540 Moderate persistent asthma, uncomplicated: Secondary | ICD-10-CM | POA: Diagnosis not present

## 2015-03-11 MED ORDER — OMALIZUMAB 150 MG ~~LOC~~ SOLR
300.0000 mg | Freq: Once | SUBCUTANEOUS | Status: AC
  Administered 2015-03-11: 300 mg via SUBCUTANEOUS

## 2015-03-12 ENCOUNTER — Telehealth: Payer: Self-pay | Admitting: Critical Care Medicine

## 2015-03-12 NOTE — Telephone Encounter (Signed)
#   vials:4 Ordered date:03/12/15 Shipping Date:03/13/15

## 2015-03-13 NOTE — Telephone Encounter (Signed)
2 different lot #s shipped. This is the correct [id number].

## 2015-03-13 NOTE — Telephone Encounter (Signed)
#   Vials:4 Arrival Date:03/13/15 Lot [id number] Exp Date:2/20

## 2015-03-25 ENCOUNTER — Ambulatory Visit (INDEPENDENT_AMBULATORY_CARE_PROVIDER_SITE_OTHER): Payer: Medicare Other

## 2015-03-25 DIAGNOSIS — J4540 Moderate persistent asthma, uncomplicated: Secondary | ICD-10-CM

## 2015-03-25 MED ORDER — OMALIZUMAB 150 MG ~~LOC~~ SOLR
300.0000 mg | Freq: Once | SUBCUTANEOUS | Status: AC
  Administered 2015-03-25: 300 mg via SUBCUTANEOUS

## 2015-04-10 ENCOUNTER — Ambulatory Visit (INDEPENDENT_AMBULATORY_CARE_PROVIDER_SITE_OTHER): Payer: Medicare Other

## 2015-04-10 DIAGNOSIS — J4540 Moderate persistent asthma, uncomplicated: Secondary | ICD-10-CM | POA: Diagnosis not present

## 2015-04-16 MED ORDER — OMALIZUMAB 150 MG ~~LOC~~ SOLR
300.0000 mg | Freq: Once | SUBCUTANEOUS | Status: AC
  Administered 2015-04-10: 300 mg via SUBCUTANEOUS

## 2015-04-17 ENCOUNTER — Encounter: Payer: Self-pay | Admitting: Critical Care Medicine

## 2015-04-17 ENCOUNTER — Ambulatory Visit (INDEPENDENT_AMBULATORY_CARE_PROVIDER_SITE_OTHER): Payer: Medicare Other | Admitting: Critical Care Medicine

## 2015-04-17 VITALS — BP 116/64 | HR 66 | Temp 98.3°F | Ht 63.5 in | Wt 129.8 lb

## 2015-04-17 DIAGNOSIS — J4541 Moderate persistent asthma with (acute) exacerbation: Secondary | ICD-10-CM

## 2015-04-17 DIAGNOSIS — J209 Acute bronchitis, unspecified: Secondary | ICD-10-CM

## 2015-04-17 MED ORDER — PREDNISONE 10 MG PO TABS: ORAL_TABLET | ORAL | Status: DC

## 2015-04-17 NOTE — Patient Instructions (Signed)
Prednisone 10mg  Take 4 for three days 3 for three days 2 for three days 1 for three days and stop Stay on advair Hold off on flu vaccine for two weeks Return 6 weeks dr Ashok Cordia

## 2015-04-17 NOTE — Progress Notes (Signed)
Subjective:    Patient ID: [name], female    DOB: 1924-07-26, 81 y.o.   MRN: [id number]  HPI 04/17/2015 Chief Complaint  Patient presents with  . 2 month follow up    Increased cough and wheezing x 1 wk.  Cough with yellow mucus.  No f/c/s.  Pt noting more dyspnea wheezing for one week.  Yellow mucus.  No f/c/s.  No chest pain.  Notes some pndrip some ankle edema  Pt denies any significant sore throat, nasal congestion or excess secretions, fever, chills, sweats, unintended weight loss, pleurtic or exertional chest pain, orthopnea PND, or leg swelling Pt denies any increase in rescue therapy over baseline, notes  waking upearly am and nocturnal exacerbations of coughing/wheezing/or dyspnea. Pt also denies any obvious fluctuation in symptoms with  weather or environmental change or other alleviating or aggravating factors   Current Medications, Allergies, Complete Past Medical History, Past Surgical History, Family History, and Social History were reviewed in [facility] [facility] record per todays encounter:  04/17/2015  Review of Systems  Constitutional: Negative.   HENT: Negative.  Negative for ear pain, postnasal drip, rhinorrhea, sinus pressure, sore throat, trouble swallowing and voice change.   Eyes: Negative.   Respiratory: Positive for cough, shortness of breath and wheezing. Negative for apnea, choking, chest tightness and stridor.   Cardiovascular: Negative.  Negative for chest pain, palpitations and leg swelling.  Gastrointestinal: Negative.  Negative for nausea, vomiting, abdominal pain and abdominal distention.  Genitourinary: Negative.   Musculoskeletal: Negative.  Negative for myalgias and arthralgias.  Skin: Negative.  Negative for rash.  Allergic/Immunologic: Negative.  Negative for environmental allergies and food allergies.  Neurological: Negative.  Negative for dizziness, syncope, weakness and headaches.  Hematological: Negative.  Negative for  adenopathy. Does not bruise/bleed easily.  Psychiatric/Behavioral: Negative.  Negative for sleep disturbance and agitation. The patient is not nervous/anxious.        Objective:   Physical Exam Filed Vitals:   04/17/15 1140  BP: 116/64  Pulse: 66  Temp: 98.3 F (36.8 C)  TempSrc: Oral  Height: 5' 3.5" (1.613 m)  Weight: 129 lb 12.8 oz (58.877 kg)  SpO2: 98%    Gen: Pleasant, well-nourished, in no distress,  normal affect  ENT: No lesions,  mouth clear,  oropharynx clear, no postnasal drip  Neck: No JVD, no TMG, no carotid bruits  Lungs: No use of accessory muscles, no dullness to percussion, expired wheezes.  Cardiovascular: RRR, heart sounds normal, no murmur or gallops, no peripheral edema  Abdomen: soft and NT, no HSM,  BS normal  Musculoskeletal: No deformities, no cyanosis or clubbing  Neuro: alert, non focal  Skin: Warm, no lesions or rashes  No results found.        Assessment & Plan:  I personally reviewed all images and lab data in the [facility] system as well as any outside material available during this office visit and agree with the  radiology impressions.   Moderate persistent asthma with acute bronchitis and acute exacerbation Moderate persistent asthma with flare  Plan Prednisone 10mg  Take 4 for three days 3 for three days 2 for three days 1 for three days and stop Stay on advair Hold off on flu vaccine for two weeks Return 6 weeks dr Shelly Coss was seen today for 2 month follow up.  Diagnoses and all orders for this visit:  Asthma, moderate persistent, with acute exacerbation  Moderate persistent asthma with acute bronchitis  and acute exacerbation  Other orders -     predniSONE (DELTASONE) 10 MG tablet; Take 4 for three days 3 for three days 2 for three days 1 for three days and stop

## 2015-04-18 ENCOUNTER — Telehealth: Payer: Self-pay | Admitting: Critical Care Medicine

## 2015-04-19 NOTE — Assessment & Plan Note (Signed)
Moderate persistent asthma with flare  Plan Prednisone 10mg  Take 4 for three days 3 for three days 2 for three days 1 for three days and stop Stay on advair Hold off on flu vaccine for two weeks Return 6 weeks dr Ashok Cordia

## 2015-04-19 NOTE — Telephone Encounter (Signed)
#   vials:4 Ordered date:04-18-15 Shipping Date:04-19-15

## 2015-04-19 NOTE — Telephone Encounter (Signed)
#   Vials:4 Arrival Date:04-19-15 Lot [id number]

## 2015-04-22 ENCOUNTER — Other Ambulatory Visit: Payer: Self-pay | Admitting: Critical Care Medicine

## 2015-04-23 ENCOUNTER — Encounter: Payer: Self-pay | Admitting: Internal Medicine

## 2015-04-24 ENCOUNTER — Ambulatory Visit (INDEPENDENT_AMBULATORY_CARE_PROVIDER_SITE_OTHER): Payer: Medicare Other

## 2015-04-24 DIAGNOSIS — J4540 Moderate persistent asthma, uncomplicated: Secondary | ICD-10-CM

## 2015-04-24 MED ORDER — OMALIZUMAB 150 MG ~~LOC~~ SOLR
300.0000 mg | Freq: Once | SUBCUTANEOUS | Status: AC
  Administered 2015-04-24: 300 mg via SUBCUTANEOUS

## 2015-05-08 ENCOUNTER — Ambulatory Visit (INDEPENDENT_AMBULATORY_CARE_PROVIDER_SITE_OTHER): Payer: Medicare Other

## 2015-05-08 DIAGNOSIS — J4540 Moderate persistent asthma, uncomplicated: Secondary | ICD-10-CM | POA: Diagnosis not present

## 2015-05-08 MED ORDER — OMALIZUMAB 150 MG ~~LOC~~ SOLR
300.0000 mg | Freq: Once | SUBCUTANEOUS | Status: AC
  Administered 2015-05-08: 300 mg via SUBCUTANEOUS

## 2015-05-10 ENCOUNTER — Telehealth: Payer: Self-pay | Admitting: Pulmonary Disease

## 2015-05-10 NOTE — Telephone Encounter (Signed)
#   vials:4 Ordered date:05/10/15 Shipping Date:05/13/15 or 05/14/15

## 2015-05-13 DIAGNOSIS — R159 Full incontinence of feces: Secondary | ICD-10-CM | POA: Diagnosis not present

## 2015-05-13 DIAGNOSIS — L509 Urticaria, unspecified: Secondary | ICD-10-CM | POA: Diagnosis not present

## 2015-05-13 DIAGNOSIS — F331 Major depressive disorder, recurrent, moderate: Secondary | ICD-10-CM | POA: Diagnosis not present

## 2015-05-13 DIAGNOSIS — E559 Vitamin D deficiency, unspecified: Secondary | ICD-10-CM | POA: Diagnosis not present

## 2015-05-13 DIAGNOSIS — Z1389 Encounter for screening for other disorder: Secondary | ICD-10-CM | POA: Diagnosis not present

## 2015-05-13 DIAGNOSIS — M1990 Unspecified osteoarthritis, unspecified site: Secondary | ICD-10-CM | POA: Diagnosis not present

## 2015-05-13 DIAGNOSIS — R32 Unspecified urinary incontinence: Secondary | ICD-10-CM | POA: Diagnosis not present

## 2015-05-13 DIAGNOSIS — Z23 Encounter for immunization: Secondary | ICD-10-CM | POA: Diagnosis not present

## 2015-05-13 DIAGNOSIS — J4540 Moderate persistent asthma, uncomplicated: Secondary | ICD-10-CM | POA: Diagnosis not present

## 2015-05-13 DIAGNOSIS — I10 Essential (primary) hypertension: Secondary | ICD-10-CM | POA: Diagnosis not present

## 2015-05-13 DIAGNOSIS — N183 Chronic kidney disease, stage 3 (moderate): Secondary | ICD-10-CM | POA: Diagnosis not present

## 2015-05-13 DIAGNOSIS — M810 Age-related osteoporosis without current pathological fracture: Secondary | ICD-10-CM | POA: Diagnosis not present

## 2015-05-13 NOTE — Telephone Encounter (Signed)
#   Vials:4 Arrival Date:05/13/15 Lot [id number]  Exp.Date:5/20

## 2015-05-22 ENCOUNTER — Ambulatory Visit (INDEPENDENT_AMBULATORY_CARE_PROVIDER_SITE_OTHER): Payer: Medicare Other

## 2015-05-22 DIAGNOSIS — J4540 Moderate persistent asthma, uncomplicated: Secondary | ICD-10-CM | POA: Diagnosis not present

## 2015-05-22 MED ORDER — OMALIZUMAB 150 MG ~~LOC~~ SOLR
300.0000 mg | Freq: Once | SUBCUTANEOUS | Status: AC
Start: 2015-05-22 — End: 2015-05-22
  Administered 2015-05-22: 300 mg via SUBCUTANEOUS

## 2015-05-24 ENCOUNTER — Ambulatory Visit (INDEPENDENT_AMBULATORY_CARE_PROVIDER_SITE_OTHER): Payer: Medicare Other | Admitting: Pulmonary Disease

## 2015-05-24 ENCOUNTER — Encounter: Payer: Self-pay | Admitting: Pulmonary Disease

## 2015-05-24 VITALS — BP 128/70 | HR 85 | Ht 63.5 in | Wt 128.0 lb

## 2015-05-24 DIAGNOSIS — J4540 Moderate persistent asthma, uncomplicated: Secondary | ICD-10-CM | POA: Diagnosis not present

## 2015-05-24 DIAGNOSIS — J3089 Other allergic rhinitis: Secondary | ICD-10-CM

## 2015-05-24 MED ORDER — ALBUTEROL SULFATE 108 (90 BASE) MCG/ACT IN AEPB: 2.0000 | INHALATION_SPRAY | Freq: Four times a day (QID) | RESPIRATORY_TRACT | Status: DC | PRN

## 2015-05-24 NOTE — Progress Notes (Signed)
Subjective:    Patient ID: [name], female    DOB: March 17, 1924, 81 y.o.   MRN: [id number]  C.C.:  Follow-up for Moderate, Persistent Asthma & Allergic Rhinitis.  HPI Moderate, Persistent Asthma:  At patient's last appointment in September she was treated for exacerbation with a prednisone taper. She has been on Xolair for some time. She denies missing any doses. She reports her breathing seems to have normalized. She reports intermittent cough that previously was only minimally productive. This is resolving. No nocturnal coughing last night. She denies any wheezing recently but did previously. Compliant with Advair & Singulair. She reports she has only needed her rescue inhaler once a day in the last week.   Allergic Rhinitis:  She reports she has had surgery previously. Denies any sinus congestion, pressure, or drainage. She uses Zyrtec, Flonase, & Singulair as prescribed.  Review of Systems No fever, chills, or sweats. No chest pain or pressure.   Allergies  Allergen Reactions  . Azithromycin Other (See Comments)    Nausea, weakness   . Aspirin   . Sulfonamide Derivatives     Current Outpatient Prescriptions on File Prior to Visit  Medication Sig Dispense Refill  . acetaminophen (TYLENOL) 500 MG tablet Take 500 mg by mouth every 4 (four) hours as needed.      Marland Kitchen ADVAIR DISKUS 250-50 MCG/DOSE AEPB inhale 1 dose by mouth twice a day 60 each 5  . albuterol (PROVENTIL HFA;VENTOLIN HFA) 108 (90 BASE) MCG/ACT inhaler Inhale 2 puffs into the lungs every 6 (six) hours as needed. 18 g 4  . amLODipine (NORVASC) 5 MG tablet Take 5 mg by mouth daily.      . cetirizine (ZYRTEC) 10 MG tablet Take 10 mg by mouth daily.    Marland Kitchen dextromethorphan (DELSYM) 30 MG/5ML liquid Take by mouth as needed for cough.    Marland Kitchen FLUoxetine (PROZAC) 20 MG capsule Take 40 mg by mouth daily.     . fluticasone (FLONASE) 50 MCG/ACT nasal spray Place 2 sprays into both nostrils daily. (Patient taking differently: Place 2  sprays into both nostrils 2 (two) times daily. ) 16 g PRN  . hydrochlorothiazide 25 MG tablet Take 25 mg by mouth daily.      . montelukast (SINGULAIR) 10 MG tablet take 1 tablet by mouth once daily 60 tablet 5  . olmesartan (BENICAR) 40 MG tablet Take 40 mg by mouth daily.      . pseudoephedrine-guaifenesin (MUCINEX D) 60-600 MG per tablet Take 1 tablet by mouth every 12 (twelve) hours as needed.     . [DISCONTINUED] rivaroxaban (XARELTO) 10 MG TABS tablet Take 1 tablet (10 mg total) by mouth daily. 12 tablet 0   No current facility-administered medications on file prior to visit.    Past Medical History  Diagnosis Date  . Asthma   . Hypertension   . Arthritis   . Depression   . Hyperlipidemia     Past Surgical History  Procedure Laterality Date  . Vaginal hysterectomy    . Hip arthroplasty  06/25/2011    Procedure: ARTHROPLASTY BIPOLAR HIP;  Surgeon: Laurice Record Aplington;  Location: [facility];  Service: Orthopedics;  Laterality: Right;    Family History  Problem Relation Age of Onset  . Asthma    . Colon cancer Father     Social History   Social History  . Marital Status: Married    Spouse Name: N/A  . Number of Children: N/A  . Years of Education:  N/A   Occupational History  . retired    Social History Main Topics  . Smoking status: Never Smoker   . Smokeless tobacco: Never Used  . Alcohol Use: 0.0 oz/week    0 Standard drinks or equivalent per week     Comment: 1 glass daily  . Drug Use: No  . Sexual Activity: Not Asked   Other Topics Concern  . None   Social History Narrative   Originally from [place]. She previously lived in [place]. 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 No bird exposure. No recent mold exposure. Previous hold did have mold.       Objective:   Physical Exam Blood pressure 128/70, pulse 85, height 5' 3.5" (1.613 m), weight 128 lb (58.06 kg), SpO2 96 %. General:  Elderly Caucasian female. No distress. Sitting in chair.  Integument:  Warm &  dry. No rash on exposed skin. No bruising. HEENT:  minimal nasal turbinate swelling. No oral ulcers. Moist mucous membranes. Cardiovascular:  Regular rate & rhythm. No edema.  Pulmonary:  Clear to auscultation bilaterally with good aeration. Normal work of breathing on room air. Abdomen: Soft. Normal bowel sounds. Nondistended.  Musculoskeletal:  Normal bulk and tone. Bilateral MCP & DIP joints with some mild DIP deformity bilaterally.  LABS 10/10/8 IgE:  490.7    Assessment & Plan:  81 year-old female with known underlying asthma. She seems to recover well from her recent exacerbation. Her allergic rhinitis seems to be well-controlled also. She does feel as though Xolair has significantly improved her control of her asthma. I instructed the patient to contact my office if she had any new breathing problems before her next appointment as I would be happy to see her sooner.  1. Moderate, persistent asthma: Well controlled. Continuing Xolair, Advair, & Singulair as prescribed. No changes. Prescribing the patient ProAir RespiClick to use as a rescue inhaler given the arthritis in her hands. 2. Allergic rhinitis: Continuing Zyrtec & Singulair. No changes. Well-controlled. 3. Health maintenance: Patient reports she did receive the influenza vaccine already. 4. Follow-up: Patient to return to clinic in 3 months or sooner if needed.

## 2015-05-24 NOTE — Patient Instructions (Signed)
1. You can try using the ProAir RespiClick in place of your Proventil/Ventolin rescue inhaler. 2. Continue taking her medicines as prescribed. 3. Please call me if you have any new breathing problems before your next appointment. 4. I will see you back in 3 months or sooner if needed.

## 2015-06-03 ENCOUNTER — Ambulatory Visit: Payer: Self-pay | Admitting: Pulmonary Disease

## 2015-06-05 ENCOUNTER — Ambulatory Visit: Payer: Self-pay

## 2015-06-07 DIAGNOSIS — H04123 Dry eye syndrome of bilateral lacrimal glands: Secondary | ICD-10-CM | POA: Diagnosis not present

## 2015-06-07 DIAGNOSIS — H43813 Vitreous degeneration, bilateral: Secondary | ICD-10-CM | POA: Diagnosis not present

## 2015-06-07 DIAGNOSIS — Z961 Presence of intraocular lens: Secondary | ICD-10-CM | POA: Diagnosis not present

## 2015-06-07 DIAGNOSIS — D2211 Melanocytic nevi of right eyelid, including canthus: Secondary | ICD-10-CM | POA: Diagnosis not present

## 2015-06-10 ENCOUNTER — Ambulatory Visit (INDEPENDENT_AMBULATORY_CARE_PROVIDER_SITE_OTHER): Payer: Medicare Other

## 2015-06-10 ENCOUNTER — Telehealth: Payer: Self-pay | Admitting: Pulmonary Disease

## 2015-06-10 DIAGNOSIS — J4540 Moderate persistent asthma, uncomplicated: Secondary | ICD-10-CM | POA: Diagnosis not present

## 2015-06-10 MED ORDER — OMALIZUMAB 150 MG ~~LOC~~ SOLR
300.0000 mg | Freq: Once | SUBCUTANEOUS | Status: AC
  Administered 2015-06-10: 300 mg via SUBCUTANEOUS

## 2015-06-10 NOTE — Telephone Encounter (Signed)
#   Vials:4 Arrival Date:06/10/15 Lot #[id number])  Exp Date:7/18(3)/ 5/18(1)

## 2015-06-12 DIAGNOSIS — Z85828 Personal history of other malignant neoplasm of skin: Secondary | ICD-10-CM | POA: Diagnosis not present

## 2015-06-12 DIAGNOSIS — D0471 Carcinoma in situ of skin of right lower limb, including hip: Secondary | ICD-10-CM | POA: Diagnosis not present

## 2015-06-14 ENCOUNTER — Telehealth: Payer: Self-pay | Admitting: Pulmonary Disease

## 2015-06-14 MED ORDER — PREDNISONE 20 MG PO TABS: 60.0000 mg | ORAL_TABLET | Freq: Every day | ORAL | Status: DC

## 2015-06-14 NOTE — Telephone Encounter (Signed)
Called and spoke with pt Pt stated worsening chest congestion, wheezing, and productive cough with clear mucus x 2 weeks  Pt states that she has taken Mucinex DM without relief and is requesting a pred taper be sent to her pharmacy  Dr Ashok Cordia, please advise. Thanks

## 2015-06-14 NOTE — Telephone Encounter (Signed)
Pt is aware of JN's recommendation. Rx has been sent in. Nothing further was needed.

## 2015-06-14 NOTE — Telephone Encounter (Signed)
Let's do a Rx for Prednisone 60mg  daily x 6 days. Thanks.

## 2015-06-24 ENCOUNTER — Ambulatory Visit: Payer: Self-pay

## 2015-06-24 DIAGNOSIS — L089 Local infection of the skin and subcutaneous tissue, unspecified: Secondary | ICD-10-CM | POA: Diagnosis not present

## 2015-06-24 DIAGNOSIS — Z4801 Encounter for change or removal of surgical wound dressing: Secondary | ICD-10-CM | POA: Diagnosis not present

## 2015-06-24 DIAGNOSIS — Z6823 Body mass index (BMI) 23.0-23.9, adult: Secondary | ICD-10-CM | POA: Diagnosis not present

## 2015-06-28 ENCOUNTER — Ambulatory Visit (INDEPENDENT_AMBULATORY_CARE_PROVIDER_SITE_OTHER): Payer: Medicare Other

## 2015-06-28 DIAGNOSIS — J4540 Moderate persistent asthma, uncomplicated: Secondary | ICD-10-CM

## 2015-07-01 DIAGNOSIS — R2681 Unsteadiness on feet: Secondary | ICD-10-CM | POA: Diagnosis not present

## 2015-07-01 DIAGNOSIS — Z9181 History of falling: Secondary | ICD-10-CM | POA: Diagnosis not present

## 2015-07-01 DIAGNOSIS — R2689 Other abnormalities of gait and mobility: Secondary | ICD-10-CM | POA: Diagnosis not present

## 2015-07-01 DIAGNOSIS — R278 Other lack of coordination: Secondary | ICD-10-CM | POA: Diagnosis not present

## 2015-07-01 MED ORDER — OMALIZUMAB 150 MG ~~LOC~~ SOLR
300.0000 mg | Freq: Once | SUBCUTANEOUS | Status: AC
  Administered 2015-06-28: 300 mg via SUBCUTANEOUS

## 2015-07-04 DIAGNOSIS — Z9181 History of falling: Secondary | ICD-10-CM | POA: Diagnosis not present

## 2015-07-04 DIAGNOSIS — R262 Difficulty in walking, not elsewhere classified: Secondary | ICD-10-CM | POA: Diagnosis not present

## 2015-07-04 DIAGNOSIS — Z7389 Other problems related to life management difficulty: Secondary | ICD-10-CM | POA: Diagnosis not present

## 2015-07-04 DIAGNOSIS — R2681 Unsteadiness on feet: Secondary | ICD-10-CM | POA: Diagnosis not present

## 2015-07-04 DIAGNOSIS — R2689 Other abnormalities of gait and mobility: Secondary | ICD-10-CM | POA: Diagnosis not present

## 2015-07-04 DIAGNOSIS — R278 Other lack of coordination: Secondary | ICD-10-CM | POA: Diagnosis not present

## 2015-07-08 DIAGNOSIS — R2681 Unsteadiness on feet: Secondary | ICD-10-CM | POA: Diagnosis not present

## 2015-07-08 DIAGNOSIS — Z9181 History of falling: Secondary | ICD-10-CM | POA: Diagnosis not present

## 2015-07-08 DIAGNOSIS — R262 Difficulty in walking, not elsewhere classified: Secondary | ICD-10-CM | POA: Diagnosis not present

## 2015-07-08 DIAGNOSIS — R278 Other lack of coordination: Secondary | ICD-10-CM | POA: Diagnosis not present

## 2015-07-08 DIAGNOSIS — Z7389 Other problems related to life management difficulty: Secondary | ICD-10-CM | POA: Diagnosis not present

## 2015-07-08 DIAGNOSIS — R2689 Other abnormalities of gait and mobility: Secondary | ICD-10-CM | POA: Diagnosis not present

## 2015-07-09 DIAGNOSIS — R262 Difficulty in walking, not elsewhere classified: Secondary | ICD-10-CM | POA: Diagnosis not present

## 2015-07-09 DIAGNOSIS — R2681 Unsteadiness on feet: Secondary | ICD-10-CM | POA: Diagnosis not present

## 2015-07-09 DIAGNOSIS — R278 Other lack of coordination: Secondary | ICD-10-CM | POA: Diagnosis not present

## 2015-07-09 DIAGNOSIS — Z7389 Other problems related to life management difficulty: Secondary | ICD-10-CM | POA: Diagnosis not present

## 2015-07-09 DIAGNOSIS — Z9181 History of falling: Secondary | ICD-10-CM | POA: Diagnosis not present

## 2015-07-09 DIAGNOSIS — R2689 Other abnormalities of gait and mobility: Secondary | ICD-10-CM | POA: Diagnosis not present

## 2015-07-10 DIAGNOSIS — Z7389 Other problems related to life management difficulty: Secondary | ICD-10-CM | POA: Diagnosis not present

## 2015-07-10 DIAGNOSIS — Z9181 History of falling: Secondary | ICD-10-CM | POA: Diagnosis not present

## 2015-07-10 DIAGNOSIS — R2689 Other abnormalities of gait and mobility: Secondary | ICD-10-CM | POA: Diagnosis not present

## 2015-07-10 DIAGNOSIS — R2681 Unsteadiness on feet: Secondary | ICD-10-CM | POA: Diagnosis not present

## 2015-07-10 DIAGNOSIS — R278 Other lack of coordination: Secondary | ICD-10-CM | POA: Diagnosis not present

## 2015-07-10 DIAGNOSIS — R262 Difficulty in walking, not elsewhere classified: Secondary | ICD-10-CM | POA: Diagnosis not present

## 2015-07-11 DIAGNOSIS — R2689 Other abnormalities of gait and mobility: Secondary | ICD-10-CM | POA: Diagnosis not present

## 2015-07-11 DIAGNOSIS — Z7389 Other problems related to life management difficulty: Secondary | ICD-10-CM | POA: Diagnosis not present

## 2015-07-11 DIAGNOSIS — R262 Difficulty in walking, not elsewhere classified: Secondary | ICD-10-CM | POA: Diagnosis not present

## 2015-07-11 DIAGNOSIS — R278 Other lack of coordination: Secondary | ICD-10-CM | POA: Diagnosis not present

## 2015-07-11 DIAGNOSIS — R2681 Unsteadiness on feet: Secondary | ICD-10-CM | POA: Diagnosis not present

## 2015-07-11 DIAGNOSIS — Z9181 History of falling: Secondary | ICD-10-CM | POA: Diagnosis not present

## 2015-07-12 ENCOUNTER — Ambulatory Visit (INDEPENDENT_AMBULATORY_CARE_PROVIDER_SITE_OTHER): Payer: Medicare Other

## 2015-07-12 DIAGNOSIS — J4540 Moderate persistent asthma, uncomplicated: Secondary | ICD-10-CM

## 2015-07-14 DIAGNOSIS — Z9181 History of falling: Secondary | ICD-10-CM | POA: Diagnosis not present

## 2015-07-14 DIAGNOSIS — R278 Other lack of coordination: Secondary | ICD-10-CM | POA: Diagnosis not present

## 2015-07-14 DIAGNOSIS — R2689 Other abnormalities of gait and mobility: Secondary | ICD-10-CM | POA: Diagnosis not present

## 2015-07-14 DIAGNOSIS — R262 Difficulty in walking, not elsewhere classified: Secondary | ICD-10-CM | POA: Diagnosis not present

## 2015-07-14 DIAGNOSIS — R2681 Unsteadiness on feet: Secondary | ICD-10-CM | POA: Diagnosis not present

## 2015-07-14 DIAGNOSIS — Z7389 Other problems related to life management difficulty: Secondary | ICD-10-CM | POA: Diagnosis not present

## 2015-07-15 DIAGNOSIS — R2681 Unsteadiness on feet: Secondary | ICD-10-CM | POA: Diagnosis not present

## 2015-07-15 DIAGNOSIS — R278 Other lack of coordination: Secondary | ICD-10-CM | POA: Diagnosis not present

## 2015-07-15 DIAGNOSIS — Z9181 History of falling: Secondary | ICD-10-CM | POA: Diagnosis not present

## 2015-07-15 DIAGNOSIS — Z7389 Other problems related to life management difficulty: Secondary | ICD-10-CM | POA: Diagnosis not present

## 2015-07-15 DIAGNOSIS — R262 Difficulty in walking, not elsewhere classified: Secondary | ICD-10-CM | POA: Diagnosis not present

## 2015-07-15 DIAGNOSIS — R2689 Other abnormalities of gait and mobility: Secondary | ICD-10-CM | POA: Diagnosis not present

## 2015-07-15 MED ORDER — OMALIZUMAB 150 MG ~~LOC~~ SOLR
300.0000 mg | Freq: Once | SUBCUTANEOUS | Status: AC
  Administered 2015-07-12: 300 mg via SUBCUTANEOUS

## 2015-07-16 DIAGNOSIS — R262 Difficulty in walking, not elsewhere classified: Secondary | ICD-10-CM | POA: Diagnosis not present

## 2015-07-16 DIAGNOSIS — Z9181 History of falling: Secondary | ICD-10-CM | POA: Diagnosis not present

## 2015-07-16 DIAGNOSIS — R2689 Other abnormalities of gait and mobility: Secondary | ICD-10-CM | POA: Diagnosis not present

## 2015-07-16 DIAGNOSIS — R278 Other lack of coordination: Secondary | ICD-10-CM | POA: Diagnosis not present

## 2015-07-16 DIAGNOSIS — Z7389 Other problems related to life management difficulty: Secondary | ICD-10-CM | POA: Diagnosis not present

## 2015-07-16 DIAGNOSIS — R2681 Unsteadiness on feet: Secondary | ICD-10-CM | POA: Diagnosis not present

## 2015-07-18 ENCOUNTER — Telehealth: Payer: Self-pay | Admitting: Pulmonary Disease

## 2015-07-18 NOTE — Telephone Encounter (Addendum)
#   vials:4 Ordered date:07/18/15 Shipping Date:07/17/14 Closed note by mistake. Will addend and put in del. Date.

## 2015-07-19 DIAGNOSIS — R278 Other lack of coordination: Secondary | ICD-10-CM | POA: Diagnosis not present

## 2015-07-19 DIAGNOSIS — R2681 Unsteadiness on feet: Secondary | ICD-10-CM | POA: Diagnosis not present

## 2015-07-19 DIAGNOSIS — R262 Difficulty in walking, not elsewhere classified: Secondary | ICD-10-CM | POA: Diagnosis not present

## 2015-07-19 DIAGNOSIS — Z7389 Other problems related to life management difficulty: Secondary | ICD-10-CM | POA: Diagnosis not present

## 2015-07-19 DIAGNOSIS — R2689 Other abnormalities of gait and mobility: Secondary | ICD-10-CM | POA: Diagnosis not present

## 2015-07-19 DIAGNOSIS — Z9181 History of falling: Secondary | ICD-10-CM | POA: Diagnosis not present

## 2015-07-23 DIAGNOSIS — Z9181 History of falling: Secondary | ICD-10-CM | POA: Diagnosis not present

## 2015-07-23 DIAGNOSIS — Z7389 Other problems related to life management difficulty: Secondary | ICD-10-CM | POA: Diagnosis not present

## 2015-07-23 DIAGNOSIS — R2681 Unsteadiness on feet: Secondary | ICD-10-CM | POA: Diagnosis not present

## 2015-07-23 DIAGNOSIS — R262 Difficulty in walking, not elsewhere classified: Secondary | ICD-10-CM | POA: Diagnosis not present

## 2015-07-23 DIAGNOSIS — R278 Other lack of coordination: Secondary | ICD-10-CM | POA: Diagnosis not present

## 2015-07-23 DIAGNOSIS — R2689 Other abnormalities of gait and mobility: Secondary | ICD-10-CM | POA: Diagnosis not present

## 2015-07-24 ENCOUNTER — Ambulatory Visit (INDEPENDENT_AMBULATORY_CARE_PROVIDER_SITE_OTHER): Payer: Medicare Other

## 2015-07-24 DIAGNOSIS — Z7389 Other problems related to life management difficulty: Secondary | ICD-10-CM | POA: Diagnosis not present

## 2015-07-24 DIAGNOSIS — R278 Other lack of coordination: Secondary | ICD-10-CM | POA: Diagnosis not present

## 2015-07-24 DIAGNOSIS — Z9181 History of falling: Secondary | ICD-10-CM | POA: Diagnosis not present

## 2015-07-24 DIAGNOSIS — R262 Difficulty in walking, not elsewhere classified: Secondary | ICD-10-CM | POA: Diagnosis not present

## 2015-07-24 DIAGNOSIS — R2689 Other abnormalities of gait and mobility: Secondary | ICD-10-CM | POA: Diagnosis not present

## 2015-07-24 DIAGNOSIS — R2681 Unsteadiness on feet: Secondary | ICD-10-CM | POA: Diagnosis not present

## 2015-07-24 DIAGNOSIS — J4540 Moderate persistent asthma, uncomplicated: Secondary | ICD-10-CM

## 2015-07-24 MED ORDER — OMALIZUMAB 150 MG ~~LOC~~ SOLR
300.0000 mg | Freq: Once | SUBCUTANEOUS | Status: AC
  Administered 2015-07-24: 300 mg via SUBCUTANEOUS

## 2015-07-25 DIAGNOSIS — Z7389 Other problems related to life management difficulty: Secondary | ICD-10-CM | POA: Diagnosis not present

## 2015-07-25 DIAGNOSIS — Z9181 History of falling: Secondary | ICD-10-CM | POA: Diagnosis not present

## 2015-07-25 DIAGNOSIS — R262 Difficulty in walking, not elsewhere classified: Secondary | ICD-10-CM | POA: Diagnosis not present

## 2015-07-25 DIAGNOSIS — R2689 Other abnormalities of gait and mobility: Secondary | ICD-10-CM | POA: Diagnosis not present

## 2015-07-25 DIAGNOSIS — R2681 Unsteadiness on feet: Secondary | ICD-10-CM | POA: Diagnosis not present

## 2015-07-25 DIAGNOSIS — R278 Other lack of coordination: Secondary | ICD-10-CM | POA: Diagnosis not present

## 2015-07-26 DIAGNOSIS — R2689 Other abnormalities of gait and mobility: Secondary | ICD-10-CM | POA: Diagnosis not present

## 2015-07-26 DIAGNOSIS — Z9181 History of falling: Secondary | ICD-10-CM | POA: Diagnosis not present

## 2015-07-26 DIAGNOSIS — R278 Other lack of coordination: Secondary | ICD-10-CM | POA: Diagnosis not present

## 2015-07-26 DIAGNOSIS — R262 Difficulty in walking, not elsewhere classified: Secondary | ICD-10-CM | POA: Diagnosis not present

## 2015-07-26 DIAGNOSIS — R2681 Unsteadiness on feet: Secondary | ICD-10-CM | POA: Diagnosis not present

## 2015-07-26 DIAGNOSIS — Z7389 Other problems related to life management difficulty: Secondary | ICD-10-CM | POA: Diagnosis not present

## 2015-07-31 DIAGNOSIS — R2689 Other abnormalities of gait and mobility: Secondary | ICD-10-CM | POA: Diagnosis not present

## 2015-07-31 DIAGNOSIS — Z7389 Other problems related to life management difficulty: Secondary | ICD-10-CM | POA: Diagnosis not present

## 2015-07-31 DIAGNOSIS — R278 Other lack of coordination: Secondary | ICD-10-CM | POA: Diagnosis not present

## 2015-07-31 DIAGNOSIS — R262 Difficulty in walking, not elsewhere classified: Secondary | ICD-10-CM | POA: Diagnosis not present

## 2015-07-31 DIAGNOSIS — Z9181 History of falling: Secondary | ICD-10-CM | POA: Diagnosis not present

## 2015-07-31 DIAGNOSIS — R2681 Unsteadiness on feet: Secondary | ICD-10-CM | POA: Diagnosis not present

## 2015-08-01 DIAGNOSIS — R262 Difficulty in walking, not elsewhere classified: Secondary | ICD-10-CM | POA: Diagnosis not present

## 2015-08-01 DIAGNOSIS — Z7389 Other problems related to life management difficulty: Secondary | ICD-10-CM | POA: Diagnosis not present

## 2015-08-01 DIAGNOSIS — R278 Other lack of coordination: Secondary | ICD-10-CM | POA: Diagnosis not present

## 2015-08-01 DIAGNOSIS — R2689 Other abnormalities of gait and mobility: Secondary | ICD-10-CM | POA: Diagnosis not present

## 2015-08-01 DIAGNOSIS — R2681 Unsteadiness on feet: Secondary | ICD-10-CM | POA: Diagnosis not present

## 2015-08-01 DIAGNOSIS — Z9181 History of falling: Secondary | ICD-10-CM | POA: Diagnosis not present

## 2015-08-05 DIAGNOSIS — R262 Difficulty in walking, not elsewhere classified: Secondary | ICD-10-CM | POA: Diagnosis not present

## 2015-08-05 DIAGNOSIS — R2689 Other abnormalities of gait and mobility: Secondary | ICD-10-CM | POA: Diagnosis not present

## 2015-08-05 DIAGNOSIS — R278 Other lack of coordination: Secondary | ICD-10-CM | POA: Diagnosis not present

## 2015-08-05 DIAGNOSIS — Z7389 Other problems related to life management difficulty: Secondary | ICD-10-CM | POA: Diagnosis not present

## 2015-08-05 DIAGNOSIS — Z9181 History of falling: Secondary | ICD-10-CM | POA: Diagnosis not present

## 2015-08-05 DIAGNOSIS — R2681 Unsteadiness on feet: Secondary | ICD-10-CM | POA: Diagnosis not present

## 2015-08-06 DIAGNOSIS — R262 Difficulty in walking, not elsewhere classified: Secondary | ICD-10-CM | POA: Diagnosis not present

## 2015-08-06 DIAGNOSIS — Z7389 Other problems related to life management difficulty: Secondary | ICD-10-CM | POA: Diagnosis not present

## 2015-08-06 DIAGNOSIS — Z9181 History of falling: Secondary | ICD-10-CM | POA: Diagnosis not present

## 2015-08-06 DIAGNOSIS — R2689 Other abnormalities of gait and mobility: Secondary | ICD-10-CM | POA: Diagnosis not present

## 2015-08-06 DIAGNOSIS — R278 Other lack of coordination: Secondary | ICD-10-CM | POA: Diagnosis not present

## 2015-08-06 DIAGNOSIS — R2681 Unsteadiness on feet: Secondary | ICD-10-CM | POA: Diagnosis not present

## 2015-08-07 ENCOUNTER — Ambulatory Visit (INDEPENDENT_AMBULATORY_CARE_PROVIDER_SITE_OTHER): Payer: Medicare Other

## 2015-08-07 DIAGNOSIS — J4540 Moderate persistent asthma, uncomplicated: Secondary | ICD-10-CM | POA: Diagnosis not present

## 2015-08-07 MED ORDER — OMALIZUMAB 150 MG ~~LOC~~ SOLR
300.0000 mg | Freq: Once | SUBCUTANEOUS | Status: AC
  Administered 2015-08-07: 300 mg via SUBCUTANEOUS

## 2015-08-08 ENCOUNTER — Other Ambulatory Visit: Payer: Self-pay | Admitting: Internal Medicine

## 2015-08-08 DIAGNOSIS — Z7389 Other problems related to life management difficulty: Secondary | ICD-10-CM | POA: Diagnosis not present

## 2015-08-08 DIAGNOSIS — R2681 Unsteadiness on feet: Secondary | ICD-10-CM | POA: Diagnosis not present

## 2015-08-08 DIAGNOSIS — R2689 Other abnormalities of gait and mobility: Secondary | ICD-10-CM | POA: Diagnosis not present

## 2015-08-08 DIAGNOSIS — R278 Other lack of coordination: Secondary | ICD-10-CM | POA: Diagnosis not present

## 2015-08-08 DIAGNOSIS — R262 Difficulty in walking, not elsewhere classified: Secondary | ICD-10-CM | POA: Diagnosis not present

## 2015-08-08 DIAGNOSIS — Z9181 History of falling: Secondary | ICD-10-CM | POA: Diagnosis not present

## 2015-08-13 DIAGNOSIS — R278 Other lack of coordination: Secondary | ICD-10-CM | POA: Diagnosis not present

## 2015-08-13 DIAGNOSIS — R2681 Unsteadiness on feet: Secondary | ICD-10-CM | POA: Diagnosis not present

## 2015-08-13 DIAGNOSIS — R262 Difficulty in walking, not elsewhere classified: Secondary | ICD-10-CM | POA: Diagnosis not present

## 2015-08-13 DIAGNOSIS — Z9181 History of falling: Secondary | ICD-10-CM | POA: Diagnosis not present

## 2015-08-13 DIAGNOSIS — Z7389 Other problems related to life management difficulty: Secondary | ICD-10-CM | POA: Diagnosis not present

## 2015-08-13 DIAGNOSIS — R2689 Other abnormalities of gait and mobility: Secondary | ICD-10-CM | POA: Diagnosis not present

## 2015-08-19 ENCOUNTER — Telehealth: Payer: Self-pay | Admitting: Pulmonary Disease

## 2015-08-19 DIAGNOSIS — R2689 Other abnormalities of gait and mobility: Secondary | ICD-10-CM | POA: Diagnosis not present

## 2015-08-19 DIAGNOSIS — Z9181 History of falling: Secondary | ICD-10-CM | POA: Diagnosis not present

## 2015-08-19 DIAGNOSIS — Z7389 Other problems related to life management difficulty: Secondary | ICD-10-CM | POA: Diagnosis not present

## 2015-08-19 DIAGNOSIS — R278 Other lack of coordination: Secondary | ICD-10-CM | POA: Diagnosis not present

## 2015-08-19 DIAGNOSIS — R262 Difficulty in walking, not elsewhere classified: Secondary | ICD-10-CM | POA: Diagnosis not present

## 2015-08-19 DIAGNOSIS — R2681 Unsteadiness on feet: Secondary | ICD-10-CM | POA: Diagnosis not present

## 2015-08-21 ENCOUNTER — Ambulatory Visit: Payer: Self-pay

## 2015-08-21 DIAGNOSIS — Z6822 Body mass index (BMI) 22.0-22.9, adult: Secondary | ICD-10-CM | POA: Diagnosis not present

## 2015-08-21 DIAGNOSIS — R413 Other amnesia: Secondary | ICD-10-CM | POA: Diagnosis not present

## 2015-08-21 DIAGNOSIS — I10 Essential (primary) hypertension: Secondary | ICD-10-CM | POA: Diagnosis not present

## 2015-08-21 DIAGNOSIS — J302 Other seasonal allergic rhinitis: Secondary | ICD-10-CM | POA: Diagnosis not present

## 2015-08-21 DIAGNOSIS — N183 Chronic kidney disease, stage 3 (moderate): Secondary | ICD-10-CM | POA: Diagnosis not present

## 2015-08-21 DIAGNOSIS — R159 Full incontinence of feces: Secondary | ICD-10-CM | POA: Diagnosis not present

## 2015-08-21 DIAGNOSIS — E559 Vitamin D deficiency, unspecified: Secondary | ICD-10-CM | POA: Diagnosis not present

## 2015-08-21 DIAGNOSIS — F331 Major depressive disorder, recurrent, moderate: Secondary | ICD-10-CM | POA: Diagnosis not present

## 2015-08-21 DIAGNOSIS — R32 Unspecified urinary incontinence: Secondary | ICD-10-CM | POA: Diagnosis not present

## 2015-08-21 DIAGNOSIS — N390 Urinary tract infection, site not specified: Secondary | ICD-10-CM | POA: Diagnosis not present

## 2015-08-21 DIAGNOSIS — M810 Age-related osteoporosis without current pathological fracture: Secondary | ICD-10-CM | POA: Diagnosis not present

## 2015-08-21 DIAGNOSIS — R8290 Unspecified abnormal findings in urine: Secondary | ICD-10-CM | POA: Diagnosis not present

## 2015-08-21 DIAGNOSIS — Z1389 Encounter for screening for other disorder: Secondary | ICD-10-CM | POA: Diagnosis not present

## 2015-08-21 NOTE — Telephone Encounter (Signed)
#   Vials:4 Arrival Date:08/21/2015  Lot [id number] Exp Date:01/2019

## 2015-08-21 NOTE — Telephone Encounter (Signed)
#   vials:4 Ordered date:08/19/15  Shipping Date:08/20/14

## 2015-08-22 DIAGNOSIS — R2689 Other abnormalities of gait and mobility: Secondary | ICD-10-CM | POA: Diagnosis not present

## 2015-08-22 DIAGNOSIS — R262 Difficulty in walking, not elsewhere classified: Secondary | ICD-10-CM | POA: Diagnosis not present

## 2015-08-22 DIAGNOSIS — R2681 Unsteadiness on feet: Secondary | ICD-10-CM | POA: Diagnosis not present

## 2015-08-22 DIAGNOSIS — Z9181 History of falling: Secondary | ICD-10-CM | POA: Diagnosis not present

## 2015-08-22 DIAGNOSIS — R278 Other lack of coordination: Secondary | ICD-10-CM | POA: Diagnosis not present

## 2015-08-22 DIAGNOSIS — Z7389 Other problems related to life management difficulty: Secondary | ICD-10-CM | POA: Diagnosis not present

## 2015-08-26 ENCOUNTER — Ambulatory Visit (INDEPENDENT_AMBULATORY_CARE_PROVIDER_SITE_OTHER): Payer: Medicare Other

## 2015-08-26 DIAGNOSIS — J4540 Moderate persistent asthma, uncomplicated: Secondary | ICD-10-CM

## 2015-08-26 MED ORDER — OMALIZUMAB 150 MG ~~LOC~~ SOLR
300.0000 mg | Freq: Once | SUBCUTANEOUS | Status: AC
  Administered 2015-08-26: 300 mg via SUBCUTANEOUS

## 2015-08-29 DIAGNOSIS — R278 Other lack of coordination: Secondary | ICD-10-CM | POA: Diagnosis not present

## 2015-08-29 DIAGNOSIS — R2689 Other abnormalities of gait and mobility: Secondary | ICD-10-CM | POA: Diagnosis not present

## 2015-08-29 DIAGNOSIS — Z9181 History of falling: Secondary | ICD-10-CM | POA: Diagnosis not present

## 2015-08-29 DIAGNOSIS — Z7389 Other problems related to life management difficulty: Secondary | ICD-10-CM | POA: Diagnosis not present

## 2015-08-29 DIAGNOSIS — R2681 Unsteadiness on feet: Secondary | ICD-10-CM | POA: Diagnosis not present

## 2015-08-29 DIAGNOSIS — R262 Difficulty in walking, not elsewhere classified: Secondary | ICD-10-CM | POA: Diagnosis not present

## 2015-08-30 DIAGNOSIS — R262 Difficulty in walking, not elsewhere classified: Secondary | ICD-10-CM | POA: Diagnosis not present

## 2015-08-30 DIAGNOSIS — R2689 Other abnormalities of gait and mobility: Secondary | ICD-10-CM | POA: Diagnosis not present

## 2015-08-30 DIAGNOSIS — Z9181 History of falling: Secondary | ICD-10-CM | POA: Diagnosis not present

## 2015-08-30 DIAGNOSIS — R2681 Unsteadiness on feet: Secondary | ICD-10-CM | POA: Diagnosis not present

## 2015-08-30 DIAGNOSIS — R278 Other lack of coordination: Secondary | ICD-10-CM | POA: Diagnosis not present

## 2015-08-30 DIAGNOSIS — Z7389 Other problems related to life management difficulty: Secondary | ICD-10-CM | POA: Diagnosis not present

## 2015-09-02 ENCOUNTER — Ambulatory Visit (INDEPENDENT_AMBULATORY_CARE_PROVIDER_SITE_OTHER): Payer: Medicare Other | Admitting: Pulmonary Disease

## 2015-09-02 ENCOUNTER — Encounter: Payer: Self-pay | Admitting: Pulmonary Disease

## 2015-09-02 VITALS — BP 122/62 | HR 84 | Ht 63.5 in | Wt 127.2 lb

## 2015-09-02 DIAGNOSIS — J3089 Other allergic rhinitis: Secondary | ICD-10-CM | POA: Diagnosis not present

## 2015-09-02 DIAGNOSIS — J4540 Moderate persistent asthma, uncomplicated: Secondary | ICD-10-CM | POA: Diagnosis not present

## 2015-09-02 NOTE — Progress Notes (Signed)
Subjective:    Patient ID: [name], female    DOB: 1924/06/29, 82 y.o.   MRN: [id number]  C.C.:  Follow-up for Moderate, Persistent Asthma & Allergic Rhinitis.  HPI Moderate, Persistent Asthma:  Prescribed Xolair, Advair, & Singulair. Patient prescribed an albuterol rescue inhaler at last appointment. She reports she continues to have an intermittent cough mostly productive of a clear mucus but occasionally yellow. She reports pretty much her baseline cough but mildly improved. She has had rare wheezing. No nocturnal awakenings with any coughing or wheezing. She is using her ProAir inhaler twice a day but that seems to be improving. No exacerbations since last appointment.  Allergic Rhinitis:  Currently prescribed Zyrtec, Flonase, Singulair, & Xolair. She denies any sinus congestion or pressure.  Review of Systems No chest pain, tightness, or pressure. No fever, chills, or sweats. Denies any abdominal pain, nausea, or diarrhea.  Allergies  Allergen Reactions  . Azithromycin Other (See Comments)    Nausea, weakness   . Aspirin   . Sulfonamide Derivatives     Current Outpatient Prescriptions on File Prior to Visit  Medication Sig Dispense Refill  . acetaminophen (TYLENOL) 500 MG tablet Take 500 mg by mouth every 4 (four) hours as needed.      Marland Kitchen ADVAIR DISKUS 250-50 MCG/DOSE AEPB inhale 1 dose by mouth twice a day 60 each 5  . albuterol (PROVENTIL HFA;VENTOLIN HFA) 108 (90 BASE) MCG/ACT inhaler Inhale 2 puffs into the lungs every 6 (six) hours as needed. 18 g 4  . Albuterol Sulfate (PROAIR RESPICLICK) [id number] (90 BASE) MCG/ACT AEPB Inhale 2 puffs into the lungs every 6 (six) hours as needed. 1 each 3  . amLODipine (NORVASC) 5 MG tablet Take 5 mg by mouth daily.      . cetirizine (ZYRTEC) 10 MG tablet Take 10 mg by mouth daily.    Marland Kitchen dextromethorphan (DELSYM) 30 MG/5ML liquid Take by mouth as needed for cough.    Marland Kitchen FLUoxetine (PROZAC) 20 MG capsule Take 40 mg by mouth daily.     .  fluticasone (FLONASE) 50 MCG/ACT nasal spray instill 2 sprays into each nostril once daily 16 g 3  . hydrochlorothiazide 25 MG tablet Take 25 mg by mouth daily.      . montelukast (SINGULAIR) 10 MG tablet take 1 tablet by mouth once daily 60 tablet 5  . olmesartan (BENICAR) 40 MG tablet Take 40 mg by mouth daily.      . pseudoephedrine-guaifenesin (MUCINEX D) 60-600 MG per tablet Take 1 tablet by mouth every 12 (twelve) hours as needed.     . [DISCONTINUED] rivaroxaban (XARELTO) 10 MG TABS tablet Take 1 tablet (10 mg total) by mouth daily. 12 tablet 0   No current facility-administered medications on file prior to visit.    Past Medical History  Diagnosis Date  . Asthma   . Hypertension   . Arthritis   . Depression   . Hyperlipidemia     Past Surgical History  Procedure Laterality Date  . Vaginal hysterectomy    . Hip arthroplasty  06/25/2011    Procedure: ARTHROPLASTY BIPOLAR HIP;  Surgeon: Laurice Record Aplington;  Location: [facility];  Service: Orthopedics;  Laterality: Right;    Family History  Problem Relation Age of Onset  . Asthma    . Colon cancer Father     Social History   Social History  . Marital Status: Married    Spouse Name: N/A  . Number of Children: N/A  .  Years of Education: N/A   Occupational History  . retired    Social History Main Topics  . Smoking status: Never Smoker   . Smokeless tobacco: Never Used  . Alcohol Use: 0.0 oz/week    0 Standard drinks or equivalent per week     Comment: 1 glass daily  . Drug Use: No  . Sexual Activity: Not Asked   Other Topics Concern  . None   Social History Narrative   Originally from [place]. She previously lived in [place]. 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 No bird exposure. No recent mold exposure. Previous hold did have mold.       Objective:   Physical Exam BP 122/62 mmHg  Pulse 84  Ht 5' 3.5" (1.613 m)  Wt 127 lb 3.2 oz (57.698 kg)  BMI 22.18 kg/m2  SpO2 97% General:  Elderly Caucasian female. No  distress. Sitting in chair.  Integument:  Warm & dry. No rash on exposed skin.  HEENT:  Mild nasal turbinate swelling. No oral ulcers. Moist mucous membranes. Cardiovascular:  Regular rate & rhythm. No edema.  Pulmonary:  Clear to auscultation bilaterally with good aeration and occasional sqeaks. Normal work of breathing on room air. Abdomen: Soft. Normal bowel sounds. Nondistended.  Musculoskeletal:  Normal bulk and tone. Bilateral MCP & DIP joints with some mild DIP deformity bilaterally.  PFT 03/29/06: FVC 2.20 L (93%) FEV1 1.45 L (91%) FEV1/FVC 0.66. 25-75 0.74 L (40%) no bronchodilator response TLC 3.80 L (88%) RV 84% ERV 42% DLCO uncorrected 83%  LABS 10/10/8 IgE:  490.7    Assessment & Plan:  82 year-old female with known underlying moderate, persistent asthma. Patient has continued to have good control of her asthma with no exacerbations since last appointment. Allergic rhinitis seems to be well controlled as well. Given her rare need for rescue inhaler I think we can safely continue her current regimen. I am spacing out her appointments some. She did question me about stopping her Xolair but given her excellent pulmonary health I'm hesitant to stop.  1. Moderate, persistent asthma: Well controlled. Continuing Xolair, Advair, & Singulair without change. 2. Allergic rhinitis: Well controlled. Continuing Zyrtec & Singulair without change. 3. Health maintenance: Patient reports she did receive the influenza vaccine already. 4. Follow-up: Patient to return to clinic in 6 months or sooner if needed.

## 2015-09-02 NOTE — Patient Instructions (Signed)
1. Continue taking your medications as prescribed. 2. I will see you back in 6 months but call if you need to be seen sooner or have any new breathing problems before then.

## 2015-09-03 DIAGNOSIS — Z9181 History of falling: Secondary | ICD-10-CM | POA: Diagnosis not present

## 2015-09-03 DIAGNOSIS — R2681 Unsteadiness on feet: Secondary | ICD-10-CM | POA: Diagnosis not present

## 2015-09-03 DIAGNOSIS — R2689 Other abnormalities of gait and mobility: Secondary | ICD-10-CM | POA: Diagnosis not present

## 2015-09-03 DIAGNOSIS — R278 Other lack of coordination: Secondary | ICD-10-CM | POA: Diagnosis not present

## 2015-09-03 DIAGNOSIS — Z7389 Other problems related to life management difficulty: Secondary | ICD-10-CM | POA: Diagnosis not present

## 2015-09-03 DIAGNOSIS — R262 Difficulty in walking, not elsewhere classified: Secondary | ICD-10-CM | POA: Diagnosis not present

## 2015-09-04 ENCOUNTER — Encounter: Payer: Self-pay | Admitting: Neurology

## 2015-09-04 ENCOUNTER — Ambulatory Visit (INDEPENDENT_AMBULATORY_CARE_PROVIDER_SITE_OTHER): Payer: Medicare Other | Admitting: Neurology

## 2015-09-04 VITALS — BP 106/51 | HR 80 | Ht 63.5 in | Wt 127.0 lb

## 2015-09-04 DIAGNOSIS — R5382 Chronic fatigue, unspecified: Secondary | ICD-10-CM

## 2015-09-04 DIAGNOSIS — R410 Disorientation, unspecified: Secondary | ICD-10-CM

## 2015-09-04 DIAGNOSIS — E538 Deficiency of other specified B group vitamins: Secondary | ICD-10-CM | POA: Diagnosis not present

## 2015-09-04 DIAGNOSIS — R5383 Other fatigue: Secondary | ICD-10-CM | POA: Insufficient documentation

## 2015-09-04 DIAGNOSIS — F05 Delirium due to known physiological condition: Secondary | ICD-10-CM

## 2015-09-04 DIAGNOSIS — R413 Other amnesia: Secondary | ICD-10-CM | POA: Diagnosis not present

## 2015-09-04 MED ORDER — DONEPEZIL HCL 10 MG PO TABS: 10.0000 mg | ORAL_TABLET | Freq: Every day | ORAL | Status: DC

## 2015-09-04 NOTE — Progress Notes (Addendum)
[facility]    Provider:  Dr [name] Referring Provider: [name], MD Primary Care Physician:  [name], MD  CC:  Memory problems  HPI:  [name] is a very pleasant 82 y.o. female here as a referral from Dr. Osborne Casco for memory loss. PMHx of Asthma, gerd, depression, cataracts. Started when she had a hip replacement after anesthesia 3-4 years ago. That's when daughter, who provides most information, first noticed it. It was mild at the time, this past fall started progressively worsening. The last 2 months it has gotten a lot worse.  She gets confused more. She had a physical therapy appointment and she was confused when she had a new therapist she didn't know. She gets confused about appointments or whether daughter is working or not. Patient gets confused about where daughter is even though it is a work day and she should know where daughter is(at work). She lives in [place] alone in an apartment. She misses medications occasionally, daughter manages the finances, she does not drive because she felt her reaction time was less, she misplaces things and can't find them occasionally, patient is independent with most ADLs and IADLs. She eats in the [employer]. She has depression and it well controlled, daughter doesn't notice mood problems, she is not as outgoing as she was. No hallucinations or delusions. She is more fatigued.   Reviewed notes, labs and imaging from outside physicians, which showed:  Ct of the head 01/2011: personally reviewed images and agree with the following:  CT HEAD WITHOUT CONTRAST  Soft tissue windows demonstrate expected cerebral atrophy. Mild low density in the periventricular white matter likely related to small vessel disease. Slightly asymmetric, greater left than right. No mass lesion, hemorrhage, hydrocephalus, acute infarct, intra-axial, or extra-axial fluid collection.  IMPRESSION:personally reviewed  and agree with the following:  1. No acute intracranial abnormality. 2. Cerebral atrophy and small vessel ischemic change. 3. Soft tissue swelling about the left supraorbital region.  Review of Systems: Patient complains of symptoms per HPI as well as the following symptoms: cough, wheezing, easy bruising, hering loss, incontinence, memory loss, confusion, decreased energy. Pertinent negatives per HPI. All others negative.   Social History   Social History  . Marital Status: Married    Spouse Name: N/A  . Number of Children: 1  . Years of Education: 16   Occupational History  . retired    Social History Main Topics  . Smoking status: Never Smoker   . Smokeless tobacco: Never Used  . Alcohol Use: 0.0 oz/week    0 Standard drinks or equivalent per week     Comment: 1 glass daily  . Drug Use: No  . Sexual Activity: Not on file   Other Topics Concern  . Not on file   Social History Narrative   Originally from [place]. She previously lived in [place]. 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 No bird exposure. No recent mold exposure. Previous hold did have mold.       Lives at [facility] retirement community: independent living   Caffeine use: 2 cups coffee /day    Family History  Problem Relation Age of Onset  . Asthma    . Colon cancer Father   . Dementia Neg Hx     Past Medical History  Diagnosis Date  . Asthma   . Hypertension   . Arthritis   . Depression   . Hyperlipidemia     Past Surgical History  Procedure Laterality Date  . Vaginal hysterectomy    . Hip arthroplasty  06/25/2011    Procedure: ARTHROPLASTY BIPOLAR HIP;  Surgeon: Laurice Record Aplington;  Location: [facility];  Service: Orthopedics;  Laterality: Right;    Current Outpatient Prescriptions  Medication Sig Dispense Refill  . acetaminophen (TYLENOL) 500 MG tablet Take 500 mg by mouth every 4 (four) hours as needed.      Marland Kitchen ADVAIR DISKUS 250-50 MCG/DOSE AEPB inhale 1 dose by mouth twice a day 60 each 5    . albuterol (PROVENTIL HFA;VENTOLIN HFA) 108 (90 BASE) MCG/ACT inhaler Inhale 2 puffs into the lungs every 6 (six) hours as needed. 18 g 4  . Albuterol Sulfate (PROAIR RESPICLICK) 123XX123 (90 BASE) MCG/ACT AEPB Inhale 2 puffs into the lungs every 6 (six) hours as needed. 1 each 3  . amLODipine (NORVASC) 5 MG tablet Take 5 mg by mouth daily.      . cetirizine (ZYRTEC) 10 MG tablet Take 10 mg by mouth daily.    Marland Kitchen dextromethorphan (DELSYM) 30 MG/5ML liquid Take by mouth as needed for cough.    Marland Kitchen FLUoxetine (PROZAC) 20 MG capsule Take 40 mg by mouth daily.     . fluticasone (FLONASE) 50 MCG/ACT nasal spray instill 2 sprays into each nostril once daily 16 g 3  . hydrochlorothiazide 25 MG tablet Take 25 mg by mouth daily.      . montelukast (SINGULAIR) 10 MG tablet take 1 tablet by mouth once daily 60 tablet 5  . olmesartan (BENICAR) 40 MG tablet Take 40 mg by mouth daily.      . pseudoephedrine-guaifenesin (MUCINEX D) 60-600 MG per tablet Take 1 tablet by mouth every 12 (twelve) hours as needed.     . donepezil (ARICEPT) 10 MG tablet Take 1 tablet (10 mg total) by mouth at bedtime. 30 tablet 11  . [DISCONTINUED] rivaroxaban (XARELTO) 10 MG TABS tablet Take 1 tablet (10 mg total) by mouth daily. 12 tablet 0   No current facility-administered medications for this visit.    Allergies as of 09/04/2015 - Review Complete 09/04/2015  Allergen Reaction Noted  . Azithromycin Other (See Comments) 04/17/2015  . Aspirin  06/25/2011  . Sulfonamide derivatives  05/13/2007    Vitals: BP 106/51 mmHg  Pulse 80  Ht 5' 3.5" (1.613 m)  Wt 127 lb (57.607 kg)  BMI 22.14 kg/m2 Last Weight:  Wt Readings from Last 1 Encounters:  09/04/15 127 lb (57.607 kg)   Last Height:   Ht Readings from Last 1 Encounters:  09/04/15 5' 3.5" (1.613 m)    Physical exam: Exam: Gen: NAD, conversant, well nourised, obese, well groomed                     CV: RRR, no MRG. No Carotid Bruits. No peripheral edema, warm,  nontender Eyes: Conjunctivae clear without exudates or hemorrhage  Neuro: Detailed Neurologic Exam  Speech:    Speech is normal; fluent and spontaneous with normal comprehension.  Cognition:  Montreal Cognitive Assessment  09/04/2015  Visuospatial/ Executive (0/5) 1  Naming (0/3) 2  Attention: Read list of digits (0/2) 2  Attention: Read list of letters (0/1) 1  Attention: Serial 7 subtraction starting at 100 (0/3) 3  Language: Repeat phrase (0/2) 2  Language : Fluency (0/1) 1  Abstraction (0/2) 1  Delayed Recall (0/5) 0  Orientation (0/6) 4  Total 17  Adjusted Score (based on education) 18     Cranial Nerves:  The pupils are equal, round, and reactive to light. The fundi are flat. Visual fields are full to finger confrontation. Extraocular movements are intact. Trigeminal sensation is intact and the muscles of mastication are normal. The face is symmetric. The palate elevates in the midline. Hearing intact. Voice is normal. Shoulder shrug is normal. The tongue has normal motion without fasciculations.   Coordination:    No dysmetria  Gait:    Cautious with walker  Motor Observation:    No asymmetry, no atrophy, and no involuntary movements noted. Tone:    Normal muscle tone.    Posture:    Posture is normal. normal erect    Strength:    Strength is V/V in the upper and lower limbs.      Sensation: intact to LT     Reflex Exam:  DTR's:    Absent AJs otherwise deep tendon reflexes in the upper and lower extremities are normal bilaterally.   Toes:    The toes are equiv bilaterally.   Clonus:    Clonus is absent.       Assessment/Plan:   very pleasant 82 y.o. female here as a referral from Dr. Osborne Casco for memory loss. PMHx of Asthma, gerd, depression, cataracts. Started when she had a hip replacement after anesthesia 3-4 years ago. MoCA 18/30 c/w moderate cognitive impairment and likely early dementia. I do recommend she is in assisted living at [name]  Discussed with family.   As far as your medications are concerned, I would like to suggest: Aricept take 1/2 tab daily and increase to a whole tab in 2 weeks. SIDE EFFECTS: Nausea, vomiting, diarrhea, loss of appetite/weight loss, dizziness, drowsiness, weakness, trouble sleeping, shakiness (tremor), or muscle cramps. More serious side effects can include AV block, bradycardia, syncope, seizures, GI bleeding, rash.  Diagnostic testing: MRI of the brain and labs    I would like to see you back in 6 months, sooner if we need to. Please call us with any interim questions, concerns, problems, updates or refill requests. Can repeat MoCA at that time, can add Namenda if further decline and/or increase Aricept.  Addendum: Received notes from primary care, labs completed 02/26/2016. CMP with creatinine of 1, otherwise normal, CBC unremarkable with some mild anemia, vitamin D normal 40 6., B12 1928  Sarina Ill, MD  [facility] [address] [place], [place] [address]  Phone [phone] Fax [phone]

## 2015-09-04 NOTE — Patient Instructions (Signed)
Remember to drink plenty of fluid, eat healthy meals and do not skip any meals. Try to eat protein with a every meal and eat a healthy snack such as fruit or nuts in between meals. Try to keep a regular sleep-wake schedule and try to exercise daily, particularly in the form of walking, 20-30 minutes a day, if you can.   As far as your medications are concerned, I would like to suggest: Aricept take 1/2 tab daily and increase to a whole tab in 2 weeks.   I would like to see you back in 6 months, sooner if we need to. Please call us with any interim questions, concerns, problems, updates or refill requests.   Our phone number is [phone]. We also have an after hours call service for urgent matters and there is a physician on-call for urgent questions. For any emergencies you know to call 911 or go to the nearest emergency room

## 2015-09-05 ENCOUNTER — Encounter: Payer: Self-pay | Admitting: Neurology

## 2015-09-05 DIAGNOSIS — R278 Other lack of coordination: Secondary | ICD-10-CM | POA: Diagnosis not present

## 2015-09-05 DIAGNOSIS — Z7389 Other problems related to life management difficulty: Secondary | ICD-10-CM | POA: Diagnosis not present

## 2015-09-05 DIAGNOSIS — R262 Difficulty in walking, not elsewhere classified: Secondary | ICD-10-CM | POA: Diagnosis not present

## 2015-09-05 DIAGNOSIS — Z9181 History of falling: Secondary | ICD-10-CM | POA: Diagnosis not present

## 2015-09-06 DIAGNOSIS — R262 Difficulty in walking, not elsewhere classified: Secondary | ICD-10-CM | POA: Diagnosis not present

## 2015-09-06 DIAGNOSIS — R278 Other lack of coordination: Secondary | ICD-10-CM | POA: Diagnosis not present

## 2015-09-06 DIAGNOSIS — Z7389 Other problems related to life management difficulty: Secondary | ICD-10-CM | POA: Diagnosis not present

## 2015-09-06 DIAGNOSIS — Z9181 History of falling: Secondary | ICD-10-CM | POA: Diagnosis not present

## 2015-09-07 LAB — RPR: RPR: NONREACTIVE

## 2015-09-07 LAB — B12 AND FOLATE PANEL
Folate: 5.1 ng/mL (ref >3.0)
VITAMIN B 12: 547 pg/mL (ref 211–946)

## 2015-09-07 LAB — THYROID PANEL WITH TSH
FREE THYROXINE INDEX: 2.4 (ref 1.2–4.9)
T3 Uptake Ratio: 30 % (ref 24–39)
T4 TOTAL: 8 ug/dL (ref 4.5–12.0)
TSH: 2.44 u[IU]/mL (ref 0.450–4.500)

## 2015-09-07 LAB — METHYLMALONIC ACID, SERUM: Methylmalonic Acid: 194 nmol/L (ref 0–378)

## 2015-09-09 ENCOUNTER — Telehealth: Payer: Self-pay | Admitting: *Deleted

## 2015-09-09 NOTE — Telephone Encounter (Signed)
Spoke to pt about normal labs per Dr [name]. She verbalized understanding.

## 2015-09-09 NOTE — Telephone Encounter (Signed)
-----   Message from Melvenia Beam, MD sent at 09/07/2015 11:46 AM EST ----- Labs normal thanks

## 2015-09-10 DIAGNOSIS — Z9181 History of falling: Secondary | ICD-10-CM | POA: Diagnosis not present

## 2015-09-10 DIAGNOSIS — R262 Difficulty in walking, not elsewhere classified: Secondary | ICD-10-CM | POA: Diagnosis not present

## 2015-09-10 DIAGNOSIS — R278 Other lack of coordination: Secondary | ICD-10-CM | POA: Diagnosis not present

## 2015-09-10 DIAGNOSIS — Z7389 Other problems related to life management difficulty: Secondary | ICD-10-CM | POA: Diagnosis not present

## 2015-09-11 ENCOUNTER — Ambulatory Visit (INDEPENDENT_AMBULATORY_CARE_PROVIDER_SITE_OTHER): Payer: Medicare Other

## 2015-09-11 DIAGNOSIS — J4540 Moderate persistent asthma, uncomplicated: Secondary | ICD-10-CM | POA: Diagnosis not present

## 2015-09-12 ENCOUNTER — Telehealth: Payer: Self-pay | Admitting: Pulmonary Disease

## 2015-09-12 MED ORDER — OMALIZUMAB 150 MG ~~LOC~~ SOLR
300.0000 mg | Freq: Once | SUBCUTANEOUS | Status: AC
  Administered 2015-09-11: 300 mg via SUBCUTANEOUS

## 2015-09-12 NOTE — Telephone Encounter (Signed)
#   vials:4 Ordered date:09/12/15 Shipping Date: Maybe 09/13/15 doubtful  09/16/15 since it was ordered late afternoon.

## 2015-09-13 DIAGNOSIS — R413 Other amnesia: Secondary | ICD-10-CM | POA: Diagnosis not present

## 2015-09-13 DIAGNOSIS — F05 Delirium due to known physiological condition: Secondary | ICD-10-CM | POA: Diagnosis not present

## 2015-09-13 NOTE — Telephone Encounter (Signed)
#   Vials:4 Arrival Date:09/13/15 Lot [id number] Exp Date:6/20

## 2015-09-16 ENCOUNTER — Ambulatory Visit (INDEPENDENT_AMBULATORY_CARE_PROVIDER_SITE_OTHER): Payer: Self-pay

## 2015-09-16 DIAGNOSIS — R5382 Chronic fatigue, unspecified: Secondary | ICD-10-CM

## 2015-09-16 DIAGNOSIS — R410 Disorientation, unspecified: Secondary | ICD-10-CM

## 2015-09-16 DIAGNOSIS — Z0289 Encounter for other administrative examinations: Secondary | ICD-10-CM

## 2015-09-16 DIAGNOSIS — R413 Other amnesia: Secondary | ICD-10-CM

## 2015-09-16 DIAGNOSIS — F05 Delirium due to known physiological condition: Principal | ICD-10-CM

## 2015-09-16 DIAGNOSIS — E538 Deficiency of other specified B group vitamins: Secondary | ICD-10-CM

## 2015-09-17 ENCOUNTER — Telehealth: Payer: Self-pay | Admitting: Neurology

## 2015-09-17 DIAGNOSIS — Z7389 Other problems related to life management difficulty: Secondary | ICD-10-CM | POA: Diagnosis not present

## 2015-09-17 DIAGNOSIS — Z9181 History of falling: Secondary | ICD-10-CM | POA: Diagnosis not present

## 2015-09-17 DIAGNOSIS — R262 Difficulty in walking, not elsewhere classified: Secondary | ICD-10-CM | POA: Diagnosis not present

## 2015-09-17 DIAGNOSIS — R278 Other lack of coordination: Secondary | ICD-10-CM | POA: Diagnosis not present

## 2015-09-17 NOTE — Telephone Encounter (Signed)
Spoke to patient. Described MRI of the brain below. Nothing acute, no strokes or masses found. Offered to discuss with her daughter but patient said she could relay. I said daughter is welcome to call me. For the white matter changes discussed close follow up with pcp to manage vascular risk factors. A lot of these changes are seen in aging however patient's atrophy and white matter changes are a little more extensive i believe. These are all chronic changes, nothing acute.   IMPRESSION: This MRI of the brain without contrast shows the following:  1. Generalized cortical atrophy that has progressed since 2012. The atrophy is most pronounced in the mesial temporal lobes.  2. Widespread T2/FLAIR hyperintense foci in both hemispheres, pons and cerebellum consistent with chronic microvascular ischemic changes. Many of these changes in the hemispheres and the cerebellum were present in 2012.  3. Although the signal changes in the pons most likely represent chronic microvascular ischemic foci, superimposed metabolic issues cannot be ruled out.  4. Chronic inflammation of the right frontal, right ethmoid cells, right hemi-sphenoid and right maxillary sinusitis with superimposed acute right maxillary sinusitis.   Will cc Dr. Osborne Casco. Will review with patient in August or welcome to come into the offcie sooner.

## 2015-09-18 DIAGNOSIS — R278 Other lack of coordination: Secondary | ICD-10-CM | POA: Diagnosis not present

## 2015-09-18 DIAGNOSIS — R262 Difficulty in walking, not elsewhere classified: Secondary | ICD-10-CM | POA: Diagnosis not present

## 2015-09-18 DIAGNOSIS — Z7389 Other problems related to life management difficulty: Secondary | ICD-10-CM | POA: Diagnosis not present

## 2015-09-18 DIAGNOSIS — Z9181 History of falling: Secondary | ICD-10-CM | POA: Diagnosis not present

## 2015-09-20 DIAGNOSIS — R262 Difficulty in walking, not elsewhere classified: Secondary | ICD-10-CM | POA: Diagnosis not present

## 2015-09-20 DIAGNOSIS — Z7389 Other problems related to life management difficulty: Secondary | ICD-10-CM | POA: Diagnosis not present

## 2015-09-20 DIAGNOSIS — Z9181 History of falling: Secondary | ICD-10-CM | POA: Diagnosis not present

## 2015-09-20 DIAGNOSIS — R278 Other lack of coordination: Secondary | ICD-10-CM | POA: Diagnosis not present

## 2015-09-23 DIAGNOSIS — Z9181 History of falling: Secondary | ICD-10-CM | POA: Diagnosis not present

## 2015-09-23 DIAGNOSIS — R262 Difficulty in walking, not elsewhere classified: Secondary | ICD-10-CM | POA: Diagnosis not present

## 2015-09-23 DIAGNOSIS — R278 Other lack of coordination: Secondary | ICD-10-CM | POA: Diagnosis not present

## 2015-09-23 DIAGNOSIS — Z7389 Other problems related to life management difficulty: Secondary | ICD-10-CM | POA: Diagnosis not present

## 2015-09-25 ENCOUNTER — Ambulatory Visit: Payer: Self-pay

## 2015-09-27 ENCOUNTER — Ambulatory Visit: Payer: Self-pay

## 2015-09-27 DIAGNOSIS — Z7389 Other problems related to life management difficulty: Secondary | ICD-10-CM | POA: Diagnosis not present

## 2015-09-27 DIAGNOSIS — Z9181 History of falling: Secondary | ICD-10-CM | POA: Diagnosis not present

## 2015-09-27 DIAGNOSIS — R262 Difficulty in walking, not elsewhere classified: Secondary | ICD-10-CM | POA: Diagnosis not present

## 2015-09-27 DIAGNOSIS — R278 Other lack of coordination: Secondary | ICD-10-CM | POA: Diagnosis not present

## 2015-09-30 ENCOUNTER — Ambulatory Visit: Payer: Self-pay

## 2015-10-01 DIAGNOSIS — R262 Difficulty in walking, not elsewhere classified: Secondary | ICD-10-CM | POA: Diagnosis not present

## 2015-10-01 DIAGNOSIS — Z9181 History of falling: Secondary | ICD-10-CM | POA: Diagnosis not present

## 2015-10-01 DIAGNOSIS — Z7389 Other problems related to life management difficulty: Secondary | ICD-10-CM | POA: Diagnosis not present

## 2015-10-01 DIAGNOSIS — R278 Other lack of coordination: Secondary | ICD-10-CM | POA: Diagnosis not present

## 2015-10-02 ENCOUNTER — Ambulatory Visit (INDEPENDENT_AMBULATORY_CARE_PROVIDER_SITE_OTHER): Payer: Medicare Other

## 2015-10-02 DIAGNOSIS — J4540 Moderate persistent asthma, uncomplicated: Secondary | ICD-10-CM | POA: Diagnosis not present

## 2015-10-02 DIAGNOSIS — Z7389 Other problems related to life management difficulty: Secondary | ICD-10-CM | POA: Diagnosis not present

## 2015-10-02 DIAGNOSIS — R262 Difficulty in walking, not elsewhere classified: Secondary | ICD-10-CM | POA: Diagnosis not present

## 2015-10-02 DIAGNOSIS — R278 Other lack of coordination: Secondary | ICD-10-CM | POA: Diagnosis not present

## 2015-10-02 DIAGNOSIS — Z9181 History of falling: Secondary | ICD-10-CM | POA: Diagnosis not present

## 2015-10-03 DIAGNOSIS — R262 Difficulty in walking, not elsewhere classified: Secondary | ICD-10-CM | POA: Diagnosis not present

## 2015-10-03 DIAGNOSIS — Z7389 Other problems related to life management difficulty: Secondary | ICD-10-CM | POA: Diagnosis not present

## 2015-10-03 DIAGNOSIS — Z9181 History of falling: Secondary | ICD-10-CM | POA: Diagnosis not present

## 2015-10-03 DIAGNOSIS — R278 Other lack of coordination: Secondary | ICD-10-CM | POA: Diagnosis not present

## 2015-10-04 DIAGNOSIS — R262 Difficulty in walking, not elsewhere classified: Secondary | ICD-10-CM | POA: Diagnosis not present

## 2015-10-04 DIAGNOSIS — Z7389 Other problems related to life management difficulty: Secondary | ICD-10-CM | POA: Diagnosis not present

## 2015-10-04 DIAGNOSIS — R278 Other lack of coordination: Secondary | ICD-10-CM | POA: Diagnosis not present

## 2015-10-04 DIAGNOSIS — Z9181 History of falling: Secondary | ICD-10-CM | POA: Diagnosis not present

## 2015-10-08 DIAGNOSIS — Z7389 Other problems related to life management difficulty: Secondary | ICD-10-CM | POA: Diagnosis not present

## 2015-10-08 DIAGNOSIS — Z9181 History of falling: Secondary | ICD-10-CM | POA: Diagnosis not present

## 2015-10-08 DIAGNOSIS — R278 Other lack of coordination: Secondary | ICD-10-CM | POA: Diagnosis not present

## 2015-10-08 DIAGNOSIS — R262 Difficulty in walking, not elsewhere classified: Secondary | ICD-10-CM | POA: Diagnosis not present

## 2015-10-09 MED ORDER — OMALIZUMAB 150 MG ~~LOC~~ SOLR
300.0000 mg | Freq: Once | SUBCUTANEOUS | Status: AC
  Administered 2015-10-02: 300 mg via SUBCUTANEOUS

## 2015-10-11 DIAGNOSIS — Z9181 History of falling: Secondary | ICD-10-CM | POA: Diagnosis not present

## 2015-10-11 DIAGNOSIS — Z7389 Other problems related to life management difficulty: Secondary | ICD-10-CM | POA: Diagnosis not present

## 2015-10-11 DIAGNOSIS — R262 Difficulty in walking, not elsewhere classified: Secondary | ICD-10-CM | POA: Diagnosis not present

## 2015-10-11 DIAGNOSIS — R278 Other lack of coordination: Secondary | ICD-10-CM | POA: Diagnosis not present

## 2015-10-14 DIAGNOSIS — R278 Other lack of coordination: Secondary | ICD-10-CM | POA: Diagnosis not present

## 2015-10-14 DIAGNOSIS — L821 Other seborrheic keratosis: Secondary | ICD-10-CM | POA: Diagnosis not present

## 2015-10-14 DIAGNOSIS — Z85828 Personal history of other malignant neoplasm of skin: Secondary | ICD-10-CM | POA: Diagnosis not present

## 2015-10-14 DIAGNOSIS — D1801 Hemangioma of skin and subcutaneous tissue: Secondary | ICD-10-CM | POA: Diagnosis not present

## 2015-10-14 DIAGNOSIS — D225 Melanocytic nevi of trunk: Secondary | ICD-10-CM | POA: Diagnosis not present

## 2015-10-14 DIAGNOSIS — R262 Difficulty in walking, not elsewhere classified: Secondary | ICD-10-CM | POA: Diagnosis not present

## 2015-10-14 DIAGNOSIS — Z9181 History of falling: Secondary | ICD-10-CM | POA: Diagnosis not present

## 2015-10-14 DIAGNOSIS — Z7389 Other problems related to life management difficulty: Secondary | ICD-10-CM | POA: Diagnosis not present

## 2015-10-15 DIAGNOSIS — R262 Difficulty in walking, not elsewhere classified: Secondary | ICD-10-CM | POA: Diagnosis not present

## 2015-10-15 DIAGNOSIS — R278 Other lack of coordination: Secondary | ICD-10-CM | POA: Diagnosis not present

## 2015-10-15 DIAGNOSIS — Z9181 History of falling: Secondary | ICD-10-CM | POA: Diagnosis not present

## 2015-10-15 DIAGNOSIS — Z7389 Other problems related to life management difficulty: Secondary | ICD-10-CM | POA: Diagnosis not present

## 2015-10-16 ENCOUNTER — Ambulatory Visit: Payer: Self-pay

## 2015-10-17 DIAGNOSIS — Z7389 Other problems related to life management difficulty: Secondary | ICD-10-CM | POA: Diagnosis not present

## 2015-10-17 DIAGNOSIS — R262 Difficulty in walking, not elsewhere classified: Secondary | ICD-10-CM | POA: Diagnosis not present

## 2015-10-17 DIAGNOSIS — Z9181 History of falling: Secondary | ICD-10-CM | POA: Diagnosis not present

## 2015-10-17 DIAGNOSIS — R278 Other lack of coordination: Secondary | ICD-10-CM | POA: Diagnosis not present

## 2015-10-21 ENCOUNTER — Ambulatory Visit (INDEPENDENT_AMBULATORY_CARE_PROVIDER_SITE_OTHER): Payer: Medicare Other

## 2015-10-21 DIAGNOSIS — J4540 Moderate persistent asthma, uncomplicated: Secondary | ICD-10-CM | POA: Diagnosis not present

## 2015-10-21 MED ORDER — OMALIZUMAB 150 MG ~~LOC~~ SOLR
300.0000 mg | Freq: Once | SUBCUTANEOUS | Status: AC
  Administered 2015-10-21: 300 mg via SUBCUTANEOUS

## 2015-10-24 ENCOUNTER — Telehealth: Payer: Self-pay | Admitting: Pulmonary Disease

## 2015-10-24 DIAGNOSIS — Z9181 History of falling: Secondary | ICD-10-CM | POA: Diagnosis not present

## 2015-10-24 DIAGNOSIS — Z7389 Other problems related to life management difficulty: Secondary | ICD-10-CM | POA: Diagnosis not present

## 2015-10-24 DIAGNOSIS — R278 Other lack of coordination: Secondary | ICD-10-CM | POA: Diagnosis not present

## 2015-10-24 DIAGNOSIS — R262 Difficulty in walking, not elsewhere classified: Secondary | ICD-10-CM | POA: Diagnosis not present

## 2015-10-24 NOTE — Telephone Encounter (Signed)
#   vials:4 Ordered date:10/24/15 Shipping Date:3/24 or 10/28/15

## 2015-10-25 NOTE — Telephone Encounter (Signed)
#   Vials:4 Arrival Date:10/25/2015  Lot [id number] Exp Date:06/2019

## 2015-10-29 DIAGNOSIS — R262 Difficulty in walking, not elsewhere classified: Secondary | ICD-10-CM | POA: Diagnosis not present

## 2015-10-29 DIAGNOSIS — Z7389 Other problems related to life management difficulty: Secondary | ICD-10-CM | POA: Diagnosis not present

## 2015-10-29 DIAGNOSIS — Z9181 History of falling: Secondary | ICD-10-CM | POA: Diagnosis not present

## 2015-10-29 DIAGNOSIS — R278 Other lack of coordination: Secondary | ICD-10-CM | POA: Diagnosis not present

## 2015-10-31 DIAGNOSIS — Z7389 Other problems related to life management difficulty: Secondary | ICD-10-CM | POA: Diagnosis not present

## 2015-10-31 DIAGNOSIS — R262 Difficulty in walking, not elsewhere classified: Secondary | ICD-10-CM | POA: Diagnosis not present

## 2015-10-31 DIAGNOSIS — R278 Other lack of coordination: Secondary | ICD-10-CM | POA: Diagnosis not present

## 2015-10-31 DIAGNOSIS — Z9181 History of falling: Secondary | ICD-10-CM | POA: Diagnosis not present

## 2015-11-04 ENCOUNTER — Ambulatory Visit (INDEPENDENT_AMBULATORY_CARE_PROVIDER_SITE_OTHER): Payer: Medicare Other

## 2015-11-04 DIAGNOSIS — J4540 Moderate persistent asthma, uncomplicated: Secondary | ICD-10-CM | POA: Diagnosis not present

## 2015-11-04 MED ORDER — OMALIZUMAB 150 MG ~~LOC~~ SOLR
300.0000 mg | Freq: Once | SUBCUTANEOUS | Status: AC
  Administered 2015-11-04: 300 mg via SUBCUTANEOUS

## 2015-11-05 DIAGNOSIS — R262 Difficulty in walking, not elsewhere classified: Secondary | ICD-10-CM | POA: Diagnosis not present

## 2015-11-05 DIAGNOSIS — R278 Other lack of coordination: Secondary | ICD-10-CM | POA: Diagnosis not present

## 2015-11-05 DIAGNOSIS — Z9181 History of falling: Secondary | ICD-10-CM | POA: Diagnosis not present

## 2015-11-05 DIAGNOSIS — Z7389 Other problems related to life management difficulty: Secondary | ICD-10-CM | POA: Diagnosis not present

## 2015-11-11 DIAGNOSIS — Z9181 History of falling: Secondary | ICD-10-CM | POA: Diagnosis not present

## 2015-11-11 DIAGNOSIS — R278 Other lack of coordination: Secondary | ICD-10-CM | POA: Diagnosis not present

## 2015-11-11 DIAGNOSIS — Z7389 Other problems related to life management difficulty: Secondary | ICD-10-CM | POA: Diagnosis not present

## 2015-11-11 DIAGNOSIS — R262 Difficulty in walking, not elsewhere classified: Secondary | ICD-10-CM | POA: Diagnosis not present

## 2015-11-13 DIAGNOSIS — Z9181 History of falling: Secondary | ICD-10-CM | POA: Diagnosis not present

## 2015-11-13 DIAGNOSIS — R278 Other lack of coordination: Secondary | ICD-10-CM | POA: Diagnosis not present

## 2015-11-13 DIAGNOSIS — Z7389 Other problems related to life management difficulty: Secondary | ICD-10-CM | POA: Diagnosis not present

## 2015-11-13 DIAGNOSIS — R262 Difficulty in walking, not elsewhere classified: Secondary | ICD-10-CM | POA: Diagnosis not present

## 2015-11-18 ENCOUNTER — Ambulatory Visit: Payer: Self-pay

## 2015-11-19 DIAGNOSIS — R262 Difficulty in walking, not elsewhere classified: Secondary | ICD-10-CM | POA: Diagnosis not present

## 2015-11-19 DIAGNOSIS — Z9181 History of falling: Secondary | ICD-10-CM | POA: Diagnosis not present

## 2015-11-19 DIAGNOSIS — Z7389 Other problems related to life management difficulty: Secondary | ICD-10-CM | POA: Diagnosis not present

## 2015-11-19 DIAGNOSIS — R278 Other lack of coordination: Secondary | ICD-10-CM | POA: Diagnosis not present

## 2015-11-20 DIAGNOSIS — R262 Difficulty in walking, not elsewhere classified: Secondary | ICD-10-CM | POA: Diagnosis not present

## 2015-11-20 DIAGNOSIS — R278 Other lack of coordination: Secondary | ICD-10-CM | POA: Diagnosis not present

## 2015-11-20 DIAGNOSIS — Z9181 History of falling: Secondary | ICD-10-CM | POA: Diagnosis not present

## 2015-11-20 DIAGNOSIS — Z7389 Other problems related to life management difficulty: Secondary | ICD-10-CM | POA: Diagnosis not present

## 2015-11-21 DIAGNOSIS — J302 Other seasonal allergic rhinitis: Secondary | ICD-10-CM | POA: Diagnosis not present

## 2015-11-21 DIAGNOSIS — R413 Other amnesia: Secondary | ICD-10-CM | POA: Diagnosis not present

## 2015-11-21 DIAGNOSIS — N183 Chronic kidney disease, stage 3 (moderate): Secondary | ICD-10-CM | POA: Diagnosis not present

## 2015-11-21 DIAGNOSIS — E871 Hypo-osmolality and hyponatremia: Secondary | ICD-10-CM | POA: Diagnosis not present

## 2015-11-21 DIAGNOSIS — E538 Deficiency of other specified B group vitamins: Secondary | ICD-10-CM | POA: Diagnosis not present

## 2015-11-21 DIAGNOSIS — I10 Essential (primary) hypertension: Secondary | ICD-10-CM | POA: Diagnosis not present

## 2015-11-21 DIAGNOSIS — J4540 Moderate persistent asthma, uncomplicated: Secondary | ICD-10-CM | POA: Diagnosis not present

## 2015-11-21 DIAGNOSIS — Z6822 Body mass index (BMI) 22.0-22.9, adult: Secondary | ICD-10-CM | POA: Diagnosis not present

## 2015-11-21 DIAGNOSIS — Z1389 Encounter for screening for other disorder: Secondary | ICD-10-CM | POA: Diagnosis not present

## 2015-11-21 DIAGNOSIS — K219 Gastro-esophageal reflux disease without esophagitis: Secondary | ICD-10-CM | POA: Diagnosis not present

## 2015-11-21 DIAGNOSIS — F331 Major depressive disorder, recurrent, moderate: Secondary | ICD-10-CM | POA: Diagnosis not present

## 2015-11-21 DIAGNOSIS — I129 Hypertensive chronic kidney disease with stage 1 through stage 4 chronic kidney disease, or unspecified chronic kidney disease: Secondary | ICD-10-CM | POA: Diagnosis not present

## 2015-11-22 ENCOUNTER — Ambulatory Visit: Payer: Self-pay

## 2015-11-25 ENCOUNTER — Other Ambulatory Visit: Payer: Self-pay | Admitting: Critical Care Medicine

## 2015-11-25 ENCOUNTER — Other Ambulatory Visit: Payer: Self-pay | Admitting: Pulmonary Disease

## 2015-11-25 DIAGNOSIS — Z9181 History of falling: Secondary | ICD-10-CM | POA: Diagnosis not present

## 2015-11-25 DIAGNOSIS — R278 Other lack of coordination: Secondary | ICD-10-CM | POA: Diagnosis not present

## 2015-11-25 DIAGNOSIS — R262 Difficulty in walking, not elsewhere classified: Secondary | ICD-10-CM | POA: Diagnosis not present

## 2015-11-25 DIAGNOSIS — Z7389 Other problems related to life management difficulty: Secondary | ICD-10-CM | POA: Diagnosis not present

## 2015-11-27 ENCOUNTER — Ambulatory Visit (INDEPENDENT_AMBULATORY_CARE_PROVIDER_SITE_OTHER): Payer: Medicare Other

## 2015-11-27 ENCOUNTER — Ambulatory Visit (INDEPENDENT_AMBULATORY_CARE_PROVIDER_SITE_OTHER): Payer: Medicare Other | Admitting: Pulmonary Disease

## 2015-11-27 ENCOUNTER — Encounter: Payer: Self-pay | Admitting: Pulmonary Disease

## 2015-11-27 VITALS — BP 122/68 | HR 66 | Ht 63.5 in | Wt 126.0 lb

## 2015-11-27 DIAGNOSIS — J302 Other seasonal allergic rhinitis: Secondary | ICD-10-CM | POA: Diagnosis not present

## 2015-11-27 DIAGNOSIS — Z9181 History of falling: Secondary | ICD-10-CM | POA: Diagnosis not present

## 2015-11-27 DIAGNOSIS — Z7389 Other problems related to life management difficulty: Secondary | ICD-10-CM | POA: Diagnosis not present

## 2015-11-27 DIAGNOSIS — J4540 Moderate persistent asthma, uncomplicated: Secondary | ICD-10-CM

## 2015-11-27 DIAGNOSIS — R262 Difficulty in walking, not elsewhere classified: Secondary | ICD-10-CM | POA: Diagnosis not present

## 2015-11-27 DIAGNOSIS — R278 Other lack of coordination: Secondary | ICD-10-CM | POA: Diagnosis not present

## 2015-11-27 MED ORDER — OMALIZUMAB 150 MG ~~LOC~~ SOLR
300.0000 mg | Freq: Once | SUBCUTANEOUS | Status: AC
  Administered 2015-11-27: 300 mg via SUBCUTANEOUS

## 2015-11-27 NOTE — Progress Notes (Signed)
Subjective:    Patient ID: [name], female    DOB: Jan 01, 1924, 82 y.o.   MRN: [id number]  C.C.:  Follow-up for Moderate, Persistent Asthma & Allergic Rhinitis.  HPI Moderate, Persistent Asthma:  Prescribed Xolair, Advair, & Singulair. She reports she has had increased coughing lately. She was seen by her PCP and had an IM steroid injection that seemed to help her cough. She reports previously she was wheezing but this too has improved. She reports her nocturHIEFnal awakenings are improving as well. Previously was using her rescue inhaler 2-3 times daily but this too has been improving.  Allergic Rhinitis:  Prescribed Zyrtec, Flonase, Singulair, & Xolair. No sinus congestion or pressure. She is having some mild drainage that can precipitate a cough.  Review of Systems No fever, chills, or sweats. No chest pain or pressure.  Allergies  Allergen Reactions  . Azithromycin Other (See Comments)    Nausea, weakness   . Aspirin   . Sulfonamide Derivatives     Current Outpatient Prescriptions on File Prior to Visit  Medication Sig Dispense Refill  . acetaminophen (TYLENOL) 500 MG tablet Take 500 mg by mouth every 4 (four) hours as needed.      Marland Kitchen ADVAIR DISKUS 250-50 MCG/DOSE AEPB inhale 1 dose by mouth twice a day 60 each 5  . Albuterol Sulfate (PROAIR RESPICLICK) 123XX123 (90 BASE) MCG/ACT AEPB Inhale 2 puffs into the lungs every 6 (six) hours as needed. 1 each 3  . amLODipine (NORVASC) 5 MG tablet Take 5 mg by mouth daily.      . cetirizine (ZYRTEC) 10 MG tablet Take 10 mg by mouth daily.    Marland Kitchen dextromethorphan (DELSYM) 30 MG/5ML liquid Take by mouth as needed for cough.    Marland Kitchen FLUoxetine (PROZAC) 20 MG capsule Take 40 mg by mouth daily.     . fluticasone (FLONASE) 50 MCG/ACT nasal spray instill 2 sprays into each nostril once daily 16 g 3  . hydrochlorothiazide 25 MG tablet Take 25 mg by mouth daily.      . montelukast (SINGULAIR) 10 MG tablet take 1 tablet by mouth once daily 60 tablet 5   . olmesartan (BENICAR) 40 MG tablet Take 40 mg by mouth daily.      . pseudoephedrine-guaifenesin (MUCINEX D) 60-600 MG per tablet Take 1 tablet by mouth every 12 (twelve) hours as needed.     . [DISCONTINUED] rivaroxaban (XARELTO) 10 MG TABS tablet Take 1 tablet (10 mg total) by mouth daily. 12 tablet 0   No current facility-administered medications on file prior to visit.    Past Medical History  Diagnosis Date  . Asthma   . Hypertension   . Arthritis   . Depression   . Hyperlipidemia     Past Surgical History  Procedure Laterality Date  . Vaginal hysterectomy    . Hip arthroplasty  06/25/2011    Procedure: ARTHROPLASTY BIPOLAR HIP;  Surgeon: Laurice Record Aplington;  Location: [facility];  Service: Orthopedics;  Laterality: Right;    Family History  Problem Relation Age of Onset  . Asthma    . Colon cancer Father   . Dementia Neg Hx     Social History   Social History  . Marital Status: Married    Spouse Name: N/A  . Number of Children: 1  . Years of Education: 16   Occupational History  . retired    Social History Main Topics  . Smoking status: Never Smoker   . Smokeless tobacco:  Never Used  . Alcohol Use: 0.0 oz/week    0 Standard drinks or equivalent per week     Comment: 1 glass daily  . Drug Use: No  . Sexual Activity: Not Asked   Other Topics Concern  . None   Social History Narrative   Originally from [place]. She previously lived in [place]. 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 No bird exposure. No recent mold exposure. Previous hold did have mold.       Lives at [facility] retirement community: independent living   Caffeine use: 2 cups coffee /day      Objective:   Physical Exam BP 122/68 mmHg  Pulse 66  Ht 5' 3.5" (1.613 m)  Wt 126 lb (57.153 kg)  BMI 21.97 kg/m2  SpO2 99% General:  Elderly Caucasian female. No distress.   Integument:  Warm & dry. No rash on exposed skin.  HEENT:  Mild nasal turbinate swelling unchanged. No oral ulcers. Moist  mucous membranes. Cardiovascular:  Regular rate & rhythm. No edema. Normal S1 & S2. Pulmonary: Mild focal wheeze and left upper lung posteriorly that cleared with a cough. Normal work of breathing on room air. Good aeration bilaterally.  Abdomen: Soft. Normal bowel sounds. Nondistended.  Musculoskeletal:  Normal bulk and tone. Bilateral MCP & DIP joints with some mild DIP deformity bilaterally.  PFT 03/29/06: FVC 2.20 L (93%) FEV1 1.45 L (91%) FEV1/FVC 0.66 FEF 25-75 0.74 L (40%) negative bronchodilator response TLC 3.80 L (88%) RV 84% ERV 42% DLCO uncorrected 83%  LABS 10/10/8 IgE:  490.7    Assessment & Plan:  82 year-old female with known underlying moderate, persistent asthma. She seems to recover well from her recent mild exacerbation of asthma. I reviewed her previous spirometry which did show mild airway obstruction. Given her exacerbations I question whether or not she has adequate control with her inhaled Advair. Her allergic rhinitis seems to be well-controlled at this time. I may adjust her inhaled medications after her next appointment spirometry is complete. I instructed the patient contact my office if he had any new breathing problems before then.   1. Moderate, persistent asthma: Continuing Xolair, Advair, & Singulair.repeat spirometry with bronchodilator challenge next appointment and consider switching to Symbicort or Dulera depending upon results.  2. Allergic rhinitis: Well controlled. Continuing Zyrtec & Singulair without change. 3. Health maintenance: received influenza vaccine October 2016, Prevnar August 2015, & Pneumovax November 2010. 4. Follow-up: Patient to return to clinic in 3 months or sooner if needed.  Sonia Baller Ashok Cordia, M.D. [facility] Pager:  [phone] After 3pm or if no response, call [phone] 10:59 AM 11/27/2015

## 2015-11-27 NOTE — Patient Instructions (Addendum)
   Continue using your medications as prescribed.  I will do a breathing test at your next appointment and we may adjust your inhalers at that time.  I will see you back in 3 months but please call me if you have any new breathing problems before then.  TESTS ORDERED: 1. Spirometry with bronchodilator challenge at next appointment.

## 2015-12-02 DIAGNOSIS — R278 Other lack of coordination: Secondary | ICD-10-CM | POA: Diagnosis not present

## 2015-12-02 DIAGNOSIS — Z7389 Other problems related to life management difficulty: Secondary | ICD-10-CM | POA: Diagnosis not present

## 2015-12-02 DIAGNOSIS — R262 Difficulty in walking, not elsewhere classified: Secondary | ICD-10-CM | POA: Diagnosis not present

## 2015-12-02 DIAGNOSIS — Z9181 History of falling: Secondary | ICD-10-CM | POA: Diagnosis not present

## 2015-12-10 DIAGNOSIS — R262 Difficulty in walking, not elsewhere classified: Secondary | ICD-10-CM | POA: Diagnosis not present

## 2015-12-10 DIAGNOSIS — Z7389 Other problems related to life management difficulty: Secondary | ICD-10-CM | POA: Diagnosis not present

## 2015-12-10 DIAGNOSIS — Z9181 History of falling: Secondary | ICD-10-CM | POA: Diagnosis not present

## 2015-12-10 DIAGNOSIS — R278 Other lack of coordination: Secondary | ICD-10-CM | POA: Diagnosis not present

## 2015-12-11 ENCOUNTER — Ambulatory Visit: Payer: Self-pay

## 2015-12-11 DIAGNOSIS — M1711 Unilateral primary osteoarthritis, right knee: Secondary | ICD-10-CM | POA: Diagnosis not present

## 2015-12-13 ENCOUNTER — Ambulatory Visit (INDEPENDENT_AMBULATORY_CARE_PROVIDER_SITE_OTHER): Payer: Medicare Other

## 2015-12-13 DIAGNOSIS — J4540 Moderate persistent asthma, uncomplicated: Secondary | ICD-10-CM

## 2015-12-13 MED ORDER — OMALIZUMAB 150 MG ~~LOC~~ SOLR
300.0000 mg | Freq: Once | SUBCUTANEOUS | Status: AC
  Administered 2015-12-13: 300 mg via SUBCUTANEOUS

## 2015-12-16 ENCOUNTER — Telehealth: Payer: Self-pay | Admitting: Pulmonary Disease

## 2015-12-16 NOTE — Telephone Encounter (Signed)
#   vials:4 Ordered date:12/13/15 Per Joellen Jersey ok to wait. Shipping Date:12/17/15

## 2015-12-17 NOTE — Telephone Encounter (Signed)
#   Vials:4 Arrival Date:12/17/15 Lot [id number] Exp Date:12/20

## 2015-12-19 DIAGNOSIS — M1711 Unilateral primary osteoarthritis, right knee: Secondary | ICD-10-CM | POA: Diagnosis not present

## 2015-12-25 DIAGNOSIS — M1711 Unilateral primary osteoarthritis, right knee: Secondary | ICD-10-CM | POA: Diagnosis not present

## 2015-12-27 ENCOUNTER — Ambulatory Visit (INDEPENDENT_AMBULATORY_CARE_PROVIDER_SITE_OTHER): Payer: Medicare Other

## 2015-12-27 DIAGNOSIS — J4540 Moderate persistent asthma, uncomplicated: Secondary | ICD-10-CM | POA: Diagnosis not present

## 2015-12-27 MED ORDER — OMALIZUMAB 150 MG ~~LOC~~ SOLR
300.0000 mg | Freq: Once | SUBCUTANEOUS | Status: AC
  Administered 2015-12-27: 300 mg via SUBCUTANEOUS

## 2016-01-10 ENCOUNTER — Ambulatory Visit: Payer: Self-pay

## 2016-01-15 ENCOUNTER — Ambulatory Visit (INDEPENDENT_AMBULATORY_CARE_PROVIDER_SITE_OTHER): Payer: Medicare Other

## 2016-01-15 DIAGNOSIS — J4540 Moderate persistent asthma, uncomplicated: Secondary | ICD-10-CM

## 2016-01-15 MED ORDER — OMALIZUMAB 150 MG ~~LOC~~ SOLR
300.0000 mg | Freq: Once | SUBCUTANEOUS | Status: AC
  Administered 2016-01-15: 300 mg via SUBCUTANEOUS

## 2016-01-22 ENCOUNTER — Telehealth: Payer: Self-pay | Admitting: Pulmonary Disease

## 2016-01-22 NOTE — Telephone Encounter (Signed)
#   vials:4 Ordered date:01/22/16 Shipping Date:01/23/16

## 2016-01-23 NOTE — Telephone Encounter (Signed)
#   Vials:4 Arrival Date:01-23-16 Lot [id number] Exp Date:07/2019

## 2016-01-29 ENCOUNTER — Ambulatory Visit: Payer: Self-pay

## 2016-01-31 ENCOUNTER — Ambulatory Visit (INDEPENDENT_AMBULATORY_CARE_PROVIDER_SITE_OTHER): Payer: Medicare Other

## 2016-01-31 ENCOUNTER — Encounter: Payer: Self-pay | Admitting: Critical Care Medicine

## 2016-01-31 DIAGNOSIS — J4540 Moderate persistent asthma, uncomplicated: Secondary | ICD-10-CM

## 2016-01-31 MED ORDER — OMALIZUMAB 150 MG ~~LOC~~ SOLR
300.0000 mg | Freq: Once | SUBCUTANEOUS | Status: AC
  Administered 2016-01-31: 300 mg via SUBCUTANEOUS

## 2016-02-14 ENCOUNTER — Ambulatory Visit (INDEPENDENT_AMBULATORY_CARE_PROVIDER_SITE_OTHER): Payer: Medicare Other

## 2016-02-14 DIAGNOSIS — J4540 Moderate persistent asthma, uncomplicated: Secondary | ICD-10-CM

## 2016-02-14 MED ORDER — OMALIZUMAB 150 MG ~~LOC~~ SOLR
300.0000 mg | Freq: Once | SUBCUTANEOUS | Status: AC
  Administered 2016-02-14: 300 mg via SUBCUTANEOUS

## 2016-02-21 ENCOUNTER — Telehealth: Payer: Self-pay | Admitting: Pulmonary Disease

## 2016-02-21 NOTE — Telephone Encounter (Signed)
#   vials:4 Ordered date:02/21/16 Shipping Date:02/24/16

## 2016-02-24 NOTE — Telephone Encounter (Signed)
#   Vials:4 Arrival Date:02/24/16 Lot [id number] Exp Date:12/20

## 2016-02-26 ENCOUNTER — Ambulatory Visit (INDEPENDENT_AMBULATORY_CARE_PROVIDER_SITE_OTHER): Payer: Medicare Other | Admitting: Pulmonary Disease

## 2016-02-26 ENCOUNTER — Encounter: Payer: Self-pay | Admitting: Pulmonary Disease

## 2016-02-26 VITALS — BP 110/60 | HR 82 | Ht 63.5 in | Wt 120.4 lb

## 2016-02-26 DIAGNOSIS — R413 Other amnesia: Secondary | ICD-10-CM | POA: Diagnosis not present

## 2016-02-26 DIAGNOSIS — J309 Allergic rhinitis, unspecified: Secondary | ICD-10-CM | POA: Diagnosis not present

## 2016-02-26 DIAGNOSIS — I129 Hypertensive chronic kidney disease with stage 1 through stage 4 chronic kidney disease, or unspecified chronic kidney disease: Secondary | ICD-10-CM | POA: Diagnosis not present

## 2016-02-26 DIAGNOSIS — J4540 Moderate persistent asthma, uncomplicated: Secondary | ICD-10-CM | POA: Diagnosis not present

## 2016-02-26 DIAGNOSIS — N183 Chronic kidney disease, stage 3 (moderate): Secondary | ICD-10-CM | POA: Diagnosis not present

## 2016-02-26 DIAGNOSIS — Z6821 Body mass index (BMI) 21.0-21.9, adult: Secondary | ICD-10-CM | POA: Diagnosis not present

## 2016-02-26 DIAGNOSIS — M1990 Unspecified osteoarthritis, unspecified site: Secondary | ICD-10-CM | POA: Diagnosis not present

## 2016-02-26 DIAGNOSIS — I10 Essential (primary) hypertension: Secondary | ICD-10-CM | POA: Diagnosis not present

## 2016-02-26 DIAGNOSIS — E559 Vitamin D deficiency, unspecified: Secondary | ICD-10-CM | POA: Diagnosis not present

## 2016-02-26 DIAGNOSIS — E538 Deficiency of other specified B group vitamins: Secondary | ICD-10-CM | POA: Diagnosis not present

## 2016-02-26 DIAGNOSIS — E871 Hypo-osmolality and hyponatremia: Secondary | ICD-10-CM | POA: Diagnosis not present

## 2016-02-26 MED ORDER — FLUTICASONE FUROATE-VILANTEROL 100-25 MCG/INH IN AEPB: 1.0000 | INHALATION_SPRAY | Freq: Every day | RESPIRATORY_TRACT | 0 refills | Status: DC

## 2016-02-26 NOTE — Patient Instructions (Addendum)
   We are going to try you on Breo to replace your Advair inhaler.    Continue using your Albuterol (ProAir) inhaler on an as needed basis for coughing, wheezing, or shortness of breath.  Do one inhalation off your Breo inhaler once daily. Remember to brush your teeth, brush your tongue, rinse & spit after using the inhaler. I recommend keeping this beside your tooth brush.  Call my office if you feel that this is easier to use and remember and we will send in a prescription to your pharmacy.  Call me if you have any new breathing problems or questions before your next appointment.  Remember to get your flu shot in September or sooner if the flu hits early.   I will see you back in 6 months or sooner if needed.

## 2016-02-26 NOTE — Addendum Note (Signed)
Addended by: Inge Rise on: 02/26/2016 01:35 PM   Modules accepted: Orders

## 2016-02-26 NOTE — Progress Notes (Signed)
Subjective:    Patient ID: [name], female    DOB: Feb 26, 1924, 82 y.o.   MRN: [id number]  C.C.:  Follow-up for Moderate, Persistent Asthma & Allergic Rhinitis.  HPI Moderate, Persistent Asthma:  Prescribed Xolair, Advair, & Singulair. She admits that she does sometimes forget her evening doses of Advair. Her No complications with her Xolair. She reports she did have a course of Prednisone for a flare at least 6 weeks ago. She has been having an intermittent cough. She denies any audible wheezing. She reports her breathing is doing pretty good now. She reports she is only sporadically using her rescue inhaler. Daughter reports she is using her rescue inhaler at least once daily. No nocturnal awakenings with coughing.   Allergic Rhinitis:  Prescribed Zyrtec, Flonase, Singulair, & Xolair. She reports she is having some mild sinus congestion & post-nasal drainage. Reports compliance with medication.   Review of Systems No chest pain, tightness, or pressure. No fever or chills. No reflux, dyspepsia, or morning brash water taste.   Allergies  Allergen Reactions  . Azithromycin Other (See Comments)    Nausea, weakness   . Aspirin   . Sulfonamide Derivatives     Current Outpatient Prescriptions on File Prior to Visit  Medication Sig Dispense Refill  . acetaminophen (TYLENOL) 500 MG tablet Take 500 mg by mouth every 4 (four) hours as needed.      Marland Kitchen ADVAIR DISKUS 250-50 MCG/DOSE AEPB inhale 1 dose by mouth twice a day 60 each 5  . Albuterol Sulfate (PROAIR RESPICLICK) 123XX123 (90 BASE) MCG/ACT AEPB Inhale 2 puffs into the lungs every 6 (six) hours as needed. 1 each 3  . amLODipine (NORVASC) 5 MG tablet Take 5 mg by mouth daily.      . cetirizine (ZYRTEC) 10 MG tablet Take 10 mg by mouth daily.    Marland Kitchen dextromethorphan (DELSYM) 30 MG/5ML liquid Take by mouth as needed for cough.    Marland Kitchen FLUoxetine (PROZAC) 20 MG capsule Take 40 mg by mouth daily.     . fluticasone (FLONASE) 50 MCG/ACT nasal spray  instill 2 sprays into each nostril once daily 16 g 3  . hydrochlorothiazide 25 MG tablet Take 25 mg by mouth daily.      . montelukast (SINGULAIR) 10 MG tablet take 1 tablet by mouth once daily 60 tablet 5  . olmesartan (BENICAR) 40 MG tablet Take 40 mg by mouth daily.      . pseudoephedrine-guaifenesin (MUCINEX D) 60-600 MG per tablet Take 1 tablet by mouth every 12 (twelve) hours as needed.     . [DISCONTINUED] rivaroxaban (XARELTO) 10 MG TABS tablet Take 1 tablet (10 mg total) by mouth daily. 12 tablet 0   No current facility-administered medications on file prior to visit.     Past Medical History:  Diagnosis Date  . Arthritis   . Asthma   . Depression   . Hyperlipidemia   . Hypertension     Past Surgical History:  Procedure Laterality Date  . HIP ARTHROPLASTY  06/25/2011   Procedure: ARTHROPLASTY BIPOLAR HIP;  Surgeon: Laurice Record Aplington;  Location: [facility];  Service: Orthopedics;  Laterality: Right;  Marland Kitchen VAGINAL HYSTERECTOMY      Family History  Problem Relation Age of Onset  . Asthma    . Colon cancer Father   . Dementia Neg Hx     Social History   Social History  . Marital status: Married    Spouse name: N/A  . Number of  children: 1  . Years of education: 64   Occupational History  . retired Retired   Social History Main Topics  . Smoking status: Never Smoker  . Smokeless tobacco: Never Used  . Alcohol use 0.0 oz/week     Comment: 1 glass daily  . Drug use: No  . Sexual activity: Not Asked   Other Topics Concern  . None   Social History Narrative   Originally from [place]. She previously lived in [place]. 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 No bird exposure. No recent mold exposure. Previous hold did have mold.       Lives at [facility] retirement community: independent living   Caffeine use: 2 cups coffee /day      Objective:   Physical Exam BP 110/60 (BP Location: Left Arm, Cuff Size: Normal)   Pulse 82   Ht 5' 3.5" (1.613 m)   Wt 120 lb 6.4 oz  (54.6 kg)   SpO2 95%   BMI 20.99 kg/m  General:  Elderly female. No distress.  Accompanied by daughter today. Integument:  Warm & dry. No rash on exposed skin.  HEENT:  Minimal nasal turbinate swelling unchanged. Moist mucous membranes. No scleral icterus. Cardiovascular:  Regular rate & rhythm. No edema. Normal S1 & S2. Pulmonary: Good aeration bilaterally. Normal work of breathing on room air. Mild wheeze that clears with coughing.  Abdomen: Soft. Normal bowel sounds. Nondistended.  Musculoskeletal:  Normal bulk and tone. Bilateral MCP & DIP joints with some mild DIP deformity bilaterally. No appreciated joint effusion.  PFT 03/29/06: FVC 2.20 L (93%) FEV1 1.45 L (91%) FEV1/FVC 0.66 FEF 25-75 0.74 L (40%) negative bronchodilator response TLC 3.80 L (88%) RV 84% ERV 42% DLCO uncorrected 83%  LABS 10/10/8 IgE:  490.7    Assessment & Plan:  82 year-old female with known underlying moderate, persistent asthma. Patient is having some difficulty remembering to take her evening dose of Advair which could be contributing to her intermittent coughing. She is also utilizing her rescue inhaler once daily. I believe that switching the patient to a once daily inhaler medication for control of her asthma may improve medication adherence. Her allergic rhinitis seems to be reasonably well-controlled at this time. I instructed the patient contact my office if she had any new breathing problems or questions before next appointment.  1. Moderate, Persistent Asthma: Continuing Xolair & Singulair. I'm trying the patient on Breo 100 to replace Advair and improve her medication adherence as well as drug delivery. I instructed the patient contact my office for prescription if this works better for her and she has no worsening or new symptoms. 2. Allergic Rhinitis: Continuing current medication regimen of Singulair, Xolair, Zyrtec, & Flonase. 3. Health Maintenance: Status post Prevnar August 2015 & Pneumovax  November 2010. Recommended influenza vaccine in the Fall. 4. Follow-up: Patient to return to clinic in 6 months or sooner if needed.  Sonia Baller Ashok Cordia, M.D. [facility] Pager:  [phone] After 3pm or if no response, call [phone] 10:36 AM 02/26/16

## 2016-02-28 ENCOUNTER — Ambulatory Visit: Payer: Self-pay

## 2016-03-04 ENCOUNTER — Encounter: Payer: Self-pay | Admitting: Neurology

## 2016-03-04 ENCOUNTER — Ambulatory Visit (INDEPENDENT_AMBULATORY_CARE_PROVIDER_SITE_OTHER): Payer: Medicare Other | Admitting: Neurology

## 2016-03-04 DIAGNOSIS — F0390 Unspecified dementia without behavioral disturbance: Secondary | ICD-10-CM

## 2016-03-04 MED ORDER — MEMANTINE HCL 5 MG PO TABS: 5.0000 mg | ORAL_TABLET | Freq: Two times a day (BID) | ORAL | 12 refills | Status: DC

## 2016-03-04 NOTE — Patient Instructions (Addendum)
Overall you are doing fairly well but I do want to suggest a few things today:   Remember to drink plenty of fluid, eat healthy meals and do not skip any meals. Try to eat protein with a every meal and eat a healthy snack such as fruit or nuts in between meals. Try to keep a regular sleep-wake schedule and try to exercise daily, particularly in the form of walking, 20-30 minutes a day, if you can.   As far as your medications are concerned, I would like to suggest: Start Namenda 5mg  twice daily. Start with 1 pill at night first for 2 weeks then increase to 1 pill twice daily. If ok in a few months then can increase to 10mg  twice daily.   I would like to see you back in 6 months, sooner if we need to. Please call us with any interim questions, concerns, problems, updates or refill requests.   Please also call us for any test results so we can go over those with you on the phone.  My clinical assistant and will answer any of your questions and relay your messages to me and also relay most of my messages to you.   Our phone number is [phone]. We also have an after hours call service for urgent matters and there is a physician on-call for urgent questions. For any emergencies you know to call 911 or go to the nearest emergency room  Memantine Tablets What is this medicine? MEMANTINE (MEM an teen) is used to treat dementia caused by Alzheimer's disease. This medicine may be used for other purposes; ask your health care provider or pharmacist if you have questions. What should I tell my health care provider before I take this medicine? They need to know if you have any of these conditions: -difficulty passing urine -kidney disease -liver disease -seizures -an unusual or allergic reaction to memantine, other medicines, foods, dyes, or preservatives -pregnant or trying to get pregnant -breast-feeding How should I use this medicine? Take this medicine by mouth with a glass of water. Follow  the directions on the prescription label. You may take this medicine with or without food. Take your doses at regular intervals. Do not take your medicine more often than directed. Continue to take your medicine even if you feel better. Do not stop taking except on the advice of your doctor or health care professional. Talk to your pediatrician regarding the use of this medicine in children. Special care may be needed. Overdosage: If you think you have taken too much of this medicine contact a poison control center or emergency room at once. NOTE: This medicine is only for you. Do not share this medicine with others. What if I miss a dose? If you miss a dose, take it as soon as you can. If it is almost time for your next dose, take only that dose. Do not take double or extra doses. If you do not take your medicine for several days, contact your health care provider. Your dose may need to be changed. What may interact with this medicine? -acetazolamide -amantadine -cimetidine -dextromethorphan -dofetilide -hydrochlorothiazide -ketamine -metformin -methazolamide -quinidine -ranitidine -sodium bicarbonate -triamterene This list may not describe all possible interactions. Give your health care provider a list of all the medicines, herbs, non-prescription drugs, or dietary supplements you use. Also tell them if you smoke, drink alcohol, or use illegal drugs. Some items may interact with your medicine. What should I watch for while using this medicine? Visit  your doctor or health care professional for regular checks on your progress. Check with your doctor or health care professional if there is no improvement in your symptoms or if they get worse. You may get drowsy or dizzy. Do not drive, use machinery, or do anything that needs mental alertness until you know how this drug affects you. Do not stand or sit up quickly, especially if you are an older patient. This reduces the risk of dizzy or  fainting spells. Alcohol can make you more drowsy and dizzy. Avoid alcoholic drinks. What side effects may I notice from receiving this medicine? Side effects that you should report to your doctor or health care professional as soon as possible: -allergic reactions like skin rash, itching or hives, swelling of the face, lips, or tongue -agitation or a feeling of restlessness -depressed mood -dizziness -hallucinations -redness, blistering, peeling or loosening of the skin, including inside the mouth -seizures -vomiting Side effects that usually do not require medical attention (report to your doctor or health care professional if they continue or are bothersome): -constipation -diarrhea -headache -nausea -trouble sleeping This list may not describe all possible side effects. Call your doctor for medical advice about side effects. You may report side effects to FDA at 1-800-FDA-1088. Where should I keep my medicine? Keep out of the reach of children. Store at room temperature between 15 degrees and 30 degrees C (59 degrees and 86 degrees F). Throw away any unused medicine after the expiration date. NOTE: This sheet is a summary. It may not cover all possible information. If you have questions about this medicine, talk to your doctor, pharmacist, or health care provider.    2016, Elsevier/Gold Standard. (2013-05-08 14:10:42)

## 2016-03-04 NOTE — Progress Notes (Signed)
[facility]    Provider:  Dr [name] Referring Provider: [name], MD Primary Care Physician:  [name], MD  CC:  Memory problems  Interval history 03/04/2016: Memory is worsening. She did not tolerate Aricept or Exelon patch due to GI effects, made her sick. Her B12 and TSH were normal. Had a long discussion about dementia, this is a progressive memory disorder. Other medications we can try include Namenda, will start today, discussed side effects. Daughter here and provides most information.   Reviewed images and findings with patient and daughter:C/w dementia. Clinically appears to be Alzheimer's,  may be a vascular component as well  This MRI of the brain without contrast shows the following: 1.    Generalized cortical atrophy that has progressed since 2012.   The atrophy is most pronounced in the mesial temporal lobes. 2.    Widespread T2/FLAIR hyperintense foci in both hemispheres, pons and cerebellum consistent with chronic microvascular ischemic changes. Many of these changes in the hemispheres and the cerebellum were present in 2012. 3.    Although the signal changes in the pons most likely represent chronic microvascular ischemic foci, superimposed metabolic issues cannot be ruled out. 4.   Chronic inflammation of the right frontal, right ethmoid cells, right hemi-sphenoid and right maxillary sinusitis with superimposed acute right maxillary sinusitis.  HPI:  [name] is a very pleasant 82 y.o. female here as a referral from Dr. Osborne Casco for memory loss. PMHx of Asthma, gerd, depression, cataracts. Started when she had a hip replacement after anesthesia 3-4 years ago. That's when daughter, who provides most information, first noticed it. It was mild at the time, this past fall started progressively worsening. The last 2 months it has gotten a lot worse.  She gets confused more. She had a physical therapy appointment and she was confused when  she had a new therapist she didn't know. She gets confused about appointments or whether daughter is working or not. Patient gets confused about where daughter is even though it is a work day and she should know where daughter is(at work). She lives in [place] alone in an apartment. She misses medications occasionally, daughter manages the finances, she does not drive because she felt her reaction time was less, she misplaces things and can't find them occasionally, patient is independent with most ADLs and IADLs. She eats in the [employer]. She has depression and it well controlled, daughter doesn't notice mood problems, she is not as outgoing as she was. No hallucinations or delusions. She is more fatigued.   Reviewed notes, labs and imaging from outside physicians, which showed:  Ct of the head 01/2011: personally reviewed images and agree with the following:  CT HEAD WITHOUT CONTRAST  Soft tissue windows demonstrate expected cerebral atrophy. Mild low density in the periventricular white matter likely related to small vessel disease. Slightly asymmetric, greater left than right. No mass lesion, hemorrhage, hydrocephalus, acute infarct, intra-axial, or extra-axial fluid collection.  IMPRESSION:personally reviewed and agree with the following:  1. No acute intracranial abnormality. 2. Cerebral atrophy and small vessel ischemic change. 3. Soft tissue swelling about the left supraorbital region.  Review of Systems: Patient complains of symptoms per HPI as well as the following symptoms: cough, wheezing, easy bruising, hering loss, incontinence, memory loss, confusion, decreased energy. Pertinent negatives per HPI. All others negative.    Social History   Social History  . Marital status: Married    Spouse name: N/A  .  Number of children: 1  . Years of education: 72   Occupational History  . retired Retired   Social History Main Topics  . Smoking status:  Never Smoker  . Smokeless tobacco: Never Used  . Alcohol use 0.0 oz/week     Comment: 1 glass daily  . Drug use: No  . Sexual activity: Not on file   Other Topics Concern  . Not on file   Social History Narrative   Originally from [place]. She previously lived in [place]. 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 No bird exposure. No recent mold exposure. Previous hold did have mold.       Lives at [facility] retirement community: independent living   Caffeine use: 2 cups coffee /day    Family History  Problem Relation Age of Onset  . Asthma    . Colon cancer Father   . Dementia Neg Hx     Past Medical History:  Diagnosis Date  . Arthritis   . Asthma   . Depression   . Hyperlipidemia   . Hypertension     Past Surgical History:  Procedure Laterality Date  . HIP ARTHROPLASTY  06/25/2011   Procedure: ARTHROPLASTY BIPOLAR HIP;  Surgeon: Laurice Record Aplington;  Location: [facility];  Service: Orthopedics;  Laterality: Right;  Marland Kitchen VAGINAL HYSTERECTOMY      Current Outpatient Prescriptions  Medication Sig Dispense Refill  . acetaminophen (TYLENOL) 500 MG tablet Take 500 mg by mouth every 4 (four) hours as needed.      . Albuterol Sulfate (PROAIR RESPICLICK) 123XX123 (90 BASE) MCG/ACT AEPB Inhale 2 puffs into the lungs every 6 (six) hours as needed. 1 each 3  . amLODipine (NORVASC) 5 MG tablet Take 5 mg by mouth daily.      . cetirizine (ZYRTEC) 10 MG tablet Take 10 mg by mouth daily.    Marland Kitchen dextromethorphan (DELSYM) 30 MG/5ML liquid Take by mouth as needed for cough.    Marland Kitchen FLUoxetine (PROZAC) 20 MG capsule Take 40 mg by mouth daily.     . fluticasone (FLONASE) 50 MCG/ACT nasal spray instill 2 sprays into each nostril once daily 16 g 3  . hydrochlorothiazide 25 MG tablet Take 25 mg by mouth daily.      . montelukast (SINGULAIR) 10 MG tablet take 1 tablet by mouth once daily 60 tablet 5  . olmesartan (BENICAR) 40 MG tablet Take 40 mg by mouth daily.      . pseudoephedrine-guaifenesin (MUCINEX D)  60-600 MG per tablet Take 1 tablet by mouth every 12 (twelve) hours as needed.     . rivastigmine (EXELON) 4.6 mg/24hr Place 1 patch onto the skin daily.  0  . fluticasone furoate-vilanterol (BREO ELLIPTA) 100-25 MCG/INH AEPB Inhale 1 puff into the lungs daily. 1 each 6  . memantine (NAMENDA) 5 MG tablet Take 1 tablet (5 mg total) by mouth 2 (two) times daily. 60 tablet 12   No current facility-administered medications for this visit.     Allergies as of 03/04/2016 - Review Complete 03/04/2016  Allergen Reaction Noted  . Azithromycin Other (See Comments) 04/17/2015  . Aspirin  06/25/2011  . Sulfonamide derivatives  05/13/2007    Vitals: BP (!) 150/65 (BP Location: Right Arm, Patient Position: Sitting, Cuff Size: Normal)   Pulse 69   Ht 5' 3.5" (1.613 m)   Wt 121 lb 12.8 oz (55.2 kg)   SpO2 94%   BMI 21.24 kg/m  Last Weight:  Wt Readings from Last 1 Encounters:  03/04/16 121 lb 12.8 oz (55.2 kg)   Last Height:   Ht Readings from Last 1 Encounters:  03/04/16 5' 3.5" (1.613 m)   Montreal Cognitive Assessment  03/04/2016 09/04/2015  Visuospatial/ Executive (0/5) 1 1  Naming (0/3) 3 2  Attention: Read list of digits (0/2) 2 2  Attention: Read list of letters (0/1) 1 1  Attention: Serial 7 subtraction starting at 100 (0/3) 1 3  Language: Repeat phrase (0/2) 2 2  Language : Fluency (0/1) 1 1  Abstraction (0/2) 1 1  Delayed Recall (0/5) 0 0  Orientation (0/6) 4 4  Total 16 17  Adjusted Score (based on education) 16 18     Assessment/Plan:   very pleasant 82 y.o. female here as a referral from Dr. Osborne Casco for memory loss. PMHx of Asthma, gerd, depression, cataracts. Started when she had a hip replacement after anesthesia 3-4 years ago. MoCA 16/30 worsening c/w moderate to advanced cognitive impairment and dementia. She is in independent living at [name] She fixes her own breakfast and has someone come to the apartment 3 days a week. They had a villa but now she has an  apartment.    As far as your medications are concerned, I would like to suggest:  I would like to suggest: Start Namenda 5mg  twice daily. Start with 1 pill at night first for 2 weeks then increase to 1 pill twice daily. If ok in a few months then can increase to 10mg  twice daily.  Discussed doing yoga, water aerobics. She used to particiapte, I encouraged starting again at [name]   Diagnostic testing: MRI of the brain and labs: reviewed today with patient and daughter, showed them the images  Recommend calcium and vitamin D.    I would like to see you back in 6 months, sooner if we need to. Please call us with any interim questions, concerns, problems, updates or refill requests. Can repeat MoCA at that time, can add Namenda if further decline and/or increase Aricept.  Addendum: Received notes from primary care, labs completed 02/26/2016. CMP with creatinine of 1, otherwise normal, CBC unremarkable with some mild anemia, vitamin D normal 40 6., B12 [address] Sarina Ill, MD  [facility] [address] [place], [place] [address]  Phone [phone] Fax [phone]  A total of 30 minutes was spent face-to-face with this patient. Over half this time was spent on counseling patient on the dementia diagnosis and different diagnostic and therapeutic options available.

## 2016-03-05 ENCOUNTER — Telehealth: Payer: Self-pay | Admitting: Pulmonary Disease

## 2016-03-05 MED ORDER — FLUTICASONE FUROATE-VILANTEROL 100-25 MCG/INH IN AEPB: 1.0000 | INHALATION_SPRAY | Freq: Every day | RESPIRATORY_TRACT | 6 refills | Status: DC

## 2016-03-05 NOTE — Telephone Encounter (Signed)
Called and spoke with pt and she is requesting that a refill of the breo be sent to her pharmacy.  Pt used the sample and stated that this did work for her. Refill sent in and pt is aware. Nothing further is needed.

## 2016-03-06 DIAGNOSIS — F0390 Unspecified dementia without behavioral disturbance: Secondary | ICD-10-CM | POA: Insufficient documentation

## 2016-03-16 ENCOUNTER — Ambulatory Visit: Payer: Self-pay

## 2016-04-10 DIAGNOSIS — I10 Essential (primary) hypertension: Secondary | ICD-10-CM | POA: Diagnosis not present

## 2016-04-10 DIAGNOSIS — R413 Other amnesia: Secondary | ICD-10-CM | POA: Diagnosis not present

## 2016-04-10 DIAGNOSIS — R110 Nausea: Secondary | ICD-10-CM | POA: Diagnosis not present

## 2016-04-10 DIAGNOSIS — K219 Gastro-esophageal reflux disease without esophagitis: Secondary | ICD-10-CM | POA: Diagnosis not present

## 2016-04-10 DIAGNOSIS — Z6821 Body mass index (BMI) 21.0-21.9, adult: Secondary | ICD-10-CM | POA: Diagnosis not present

## 2016-04-10 DIAGNOSIS — R1030 Lower abdominal pain, unspecified: Secondary | ICD-10-CM | POA: Diagnosis not present

## 2016-04-15 ENCOUNTER — Ambulatory Visit: Payer: Self-pay

## 2016-04-20 ENCOUNTER — Ambulatory Visit: Payer: Self-pay

## 2016-04-29 ENCOUNTER — Other Ambulatory Visit: Payer: Self-pay | Admitting: Pulmonary Disease

## 2016-04-29 DIAGNOSIS — R1030 Lower abdominal pain, unspecified: Secondary | ICD-10-CM | POA: Diagnosis not present

## 2016-04-29 DIAGNOSIS — Z6821 Body mass index (BMI) 21.0-21.9, adult: Secondary | ICD-10-CM | POA: Diagnosis not present

## 2016-04-29 DIAGNOSIS — R110 Nausea: Secondary | ICD-10-CM | POA: Diagnosis not present

## 2016-04-29 DIAGNOSIS — Z23 Encounter for immunization: Secondary | ICD-10-CM | POA: Diagnosis not present

## 2016-04-30 ENCOUNTER — Other Ambulatory Visit: Payer: Self-pay | Admitting: Internal Medicine

## 2016-04-30 DIAGNOSIS — R110 Nausea: Secondary | ICD-10-CM

## 2016-04-30 DIAGNOSIS — R1030 Lower abdominal pain, unspecified: Secondary | ICD-10-CM

## 2016-05-01 DIAGNOSIS — Z6821 Body mass index (BMI) 21.0-21.9, adult: Secondary | ICD-10-CM | POA: Diagnosis not present

## 2016-05-01 DIAGNOSIS — J189 Pneumonia, unspecified organism: Secondary | ICD-10-CM | POA: Diagnosis not present

## 2016-05-01 DIAGNOSIS — I10 Essential (primary) hypertension: Secondary | ICD-10-CM | POA: Diagnosis not present

## 2016-05-01 DIAGNOSIS — R2689 Other abnormalities of gait and mobility: Secondary | ICD-10-CM | POA: Diagnosis not present

## 2016-05-05 ENCOUNTER — Non-Acute Institutional Stay: Payer: Medicare Other | Admitting: Internal Medicine

## 2016-05-05 ENCOUNTER — Encounter: Payer: Self-pay | Admitting: Internal Medicine

## 2016-05-05 DIAGNOSIS — J4540 Moderate persistent asthma, uncomplicated: Secondary | ICD-10-CM

## 2016-05-05 DIAGNOSIS — R5382 Chronic fatigue, unspecified: Secondary | ICD-10-CM

## 2016-05-05 DIAGNOSIS — K219 Gastro-esophageal reflux disease without esophagitis: Secondary | ICD-10-CM

## 2016-05-05 DIAGNOSIS — R531 Weakness: Secondary | ICD-10-CM

## 2016-05-05 DIAGNOSIS — F325 Major depressive disorder, single episode, in full remission: Secondary | ICD-10-CM | POA: Diagnosis not present

## 2016-05-05 DIAGNOSIS — F0280 Dementia in other diseases classified elsewhere without behavioral disturbance: Secondary | ICD-10-CM | POA: Diagnosis not present

## 2016-05-05 DIAGNOSIS — G301 Alzheimer's disease with late onset: Secondary | ICD-10-CM | POA: Diagnosis not present

## 2016-05-05 NOTE — Progress Notes (Signed)
Patient ID: [name], female   DOB: 31-May-1924, 82 y.o.   MRN: [id number]  Provider:  Rexene Edison. Mariea Clonts, D.O., C.M.D. Location:  [facility] Room Number: [place] of Service:  ALF (13)  PCP: [name], MD Patient Care Team: [name], MD as PCP - General (Internal Medicine) Elsie Stain, MD (Pulmonary Disease)  Extended Emergency Contact Information Primary Emergency Contact: Earlene Plater of [place] Phone: [phone] Mobile Phone: [phone] Relation: Daughter  Code Status: DNR Goals of Care: Advanced Directive information Advanced Directives 05/05/2016  Does patient have an advance directive? Yes  Type of Advance Directive Healthcare Power of Attorney  Copy of advanced directive(s) in chart? Yes  Pre-existing out of facility DNR order (yellow form or pink MOST form) -   Chief Complaint  Patient presents with  . Respite Assisted Living    due to fall at home    HPI: Patient is a 82 y.o. female seen today for admission to assisted living for respite stay to determine if she should stay in AL long term due to recent falls at home and need for more assistance managing medications.  She has a diagnosis of AD with a possible vascular component.  She did not tolerate aricept or the exelon patch (GI) and was started on namenda 03/04/16.  She also has a h/o asthma, GERD, depression, cararacts, prior hip replacement 3-4 years ago.  Notes indicate that her memory has been getting worse over the summer.  She was getting confused about where her daughter went in the days (work).  She also was not managing her finances (daughter took over) and missed medications.  She did quit driving.  She remains independent in ADLs.  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  She scored 16/30 on MoCA.  She previously participated in yoga and water aerobics.    When seen, she reported feeling well outside of some weakness and  soreness after her 3 recent falls.  She is getting PT, OT.  She had a skin tear on her right leg that is being dressed.    Past Medical History:  Diagnosis Date  . Arthritis   . Asthma   . Depression   . Hyperlipidemia   . Hypertension    Past Surgical History:  Procedure Laterality Date  . HIP ARTHROPLASTY  06/25/2011   Procedure: ARTHROPLASTY BIPOLAR HIP;  Surgeon: Laurice Record [place];  Location: [facility];  Service: Orthopedics;  Laterality: Right;  Marland Kitchen VAGINAL HYSTERECTOMY      reports that she has never smoked. She has never used smokeless tobacco. She reports that she drinks alcohol. She reports that she does not use drugs. Social History   Social History  . Marital status: Married    Spouse name: N/A  . Number of children: 1  . Years of education: 42   Occupational History  . retired Retired   Social History Main Topics  . Smoking status: Never Smoker  . Smokeless tobacco: Never Used  . Alcohol use 0.0 oz/week     Comment: 1 glass daily  . Drug use: No  . Sexual activity: Not on file   Other Topics Concern  . Not on file   Social History Narrative   Originally from [place]. She previously lived in [place]. 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 No bird exposure. No recent mold exposure. Previous hold did have mold.       Lives at [facility] retirement  community: independent living   Caffeine use: 2 cups coffee /day    Functional Status Survey:  see hpi  Family History  Problem Relation Age of Onset  . Asthma    . Colon cancer Father   . Dementia Neg Hx     Health Maintenance  Topic Date Due  . TETANUS/TDAP  09/25/1944  . ZOSTAVAX  09/25/1985  . DEXA SCAN  09/25/1990  . INFLUENZA VACCINE  03/03/2016  . PNA vac Low Risk Adult  Completed    Allergies  Allergen Reactions  . Azithromycin Other (See Comments)    Nausea, weakness   . Aspirin   . Sulfonamide Derivatives       Medication List       Accurate as of 05/05/16 11:43 AM. Always use your most  recent med list.          acetaminophen 500 MG tablet Commonly known as:  TYLENOL Take 500 mg by mouth every 4 (four) hours as needed.   albuterol 108 (90 Base) MCG/ACT inhaler Commonly known as:  PROVENTIL HFA;VENTOLIN HFA Inhale into the lungs every 6 (six) hours as needed for wheezing or shortness of breath.   CALCIUM 600-D 600-400 MG-UNIT Tabs Generic drug:  Calcium Carbonate-Vitamin D3 Take by mouth daily.   FLUoxetine 20 MG capsule Commonly known as:  PROZAC Take 40 mg by mouth daily.   fluticasone 50 MCG/ACT nasal spray Commonly known as:  FLONASE instill 2 sprays into each nostril once daily   fluticasone furoate-vilanterol 100-25 MCG/INH Aepb Commonly known as:  BREO ELLIPTA Inhale 1 puff into the lungs daily.   levofloxacin 500 MG tablet Commonly known as:  LEVAQUIN Take 500 mg by mouth daily.   loratadine 10 MG tablet Commonly known as:  CLARITIN Take 10 mg by mouth daily.   memantine 5 MG tablet Commonly known as:  NAMENDA Take 1 tablet (5 mg total) by mouth 2 (two) times daily.   montelukast 10 MG tablet Commonly known as:  SINGULAIR take 1 tablet by mouth once daily   omeprazole 20 MG capsule Commonly known as:  PRILOSEC take 1 capsule by mouth once daily 30 MINUTES BEFORE A MEAL   SYSTANE 0.4-0.3 % Soln Generic drug:  Polyethyl Glycol-Propyl Glycol Apply to eye as needed (dry eyes).   Vitamin B-12 3000 MCG Subl Place under the tongue daily.   Vitamin D-3 1000 units Caps Take by mouth daily.       Review of Systems  Constitutional: Positive for activity change and fatigue. Negative for appetite change, chills, fever and unexpected weight change.  HENT: Negative for congestion and sinus pressure.   Eyes: Negative for visual disturbance.  Respiratory: Negative for chest tightness and shortness of breath.   Cardiovascular: Negative for chest pain, palpitations and leg swelling.  Gastrointestinal: Negative for abdominal distention.    Genitourinary: Negative for dysuria, frequency and urgency.  Musculoskeletal: Positive for gait problem and myalgias.       Falls  Skin: Negative for color change.       Skin tear and right anterior shin  Neurological: Positive for weakness. Negative for dizziness.  Hematological: Bruises/bleeds easily.  Psychiatric/Behavioral: Positive for confusion. Negative for suicidal ideas.       Short term memory loss    Vitals:   05/05/16 1043  BP: (!) 146/75  Pulse: 74  Resp: 20  Temp: 98.6 F (37 C)  TempSrc: Oral  SpO2: 97%  Weight: 115 lb (52.2 kg)   Body mass index is  20.05 kg/m. Physical Exam  Constitutional: She appears well-developed and well-nourished. No distress.  HENT:  Head: Normocephalic and atraumatic.  Right Ear: External ear normal.  Left Ear: External ear normal.  Nose: Nose normal.  Mouth/Throat: Oropharynx is clear and moist.  Eyes: Conjunctivae and EOM are normal. Pupils are equal, round, and reactive to light.  Neck: Normal range of motion. Neck supple. No JVD present. No thyromegaly present.  Cardiovascular: Normal rate, regular rhythm, normal heart sounds and intact distal pulses.   Pulmonary/Chest: Effort normal and breath sounds normal. No respiratory distress.  Abdominal: Soft. Bowel sounds are normal. She exhibits no distension and no mass. There is no tenderness. There is no rebound and no guarding.  Musculoskeletal: Normal range of motion.  Lymphadenopathy:    She has no cervical adenopathy.  Neurological: She is alert.  Oriented to person, place, not precise date, time  Skin: Skin is warm and dry.  Skin tear right anterior shin slowly healing  Psychiatric: She has a normal mood and affect.    Labs reviewed: Basic Metabolic Panel: No results for input(s): NA, K, CL, CO2, GLUCOSE, BUN, CREATININE, CALCIUM, MG, PHOS in the last 8760 hours. Liver Function Tests: No results for input(s): AST, ALT, ALKPHOS, BILITOT, PROT, ALBUMIN in the last 8760  hours. No results for input(s): LIPASE, AMYLASE in the last 8760 hours. No results for input(s): AMMONIA in the last 8760 hours. CBC: No results for input(s): WBC, NEUTROABS, HGB, HCT, MCV, PLT in the last 8760 hours. Cardiac Enzymes: No results for input(s): CKTOTAL, CKMB, CKMBINDEX, TROPONINI in the last 8760 hours. BNP: Invalid input(s): POCBNP No results found for: HGBA1C Lab Results  Component Value Date   TSH 2.440 09/04/2015   Lab Results  Component Value Date   [id number] 892 09/04/2015   Lab Results  Component Value Date   FOLATE 5.1 09/04/2015   No results found for: IRON, TIBC, FERRITIN  Imaging and Procedures obtained prior to SNF admission: Dg Chest 1 View  Result Date: 06/25/2011 *RADIOLOGY REPORT* Clinical Data: Right hip fracture, fall CHEST - 1 VIEW Comparison: 01/16/2011 Findings: Borderline enlargement of cardiac silhouette. Mediastinal contours and pulmonary vascularity normal. Bronchitic changes without infiltrate or effusion. No pneumothorax or fracture identified. Bones appear demineralized. IMPRESSION: Bronchitic changes. Borderline enlargement of cardiac silhouette. Original Report Authenticated By: Burnetta Sabin, M.D.  Dg Wrist Complete Right  Result Date: 06/25/2011 *RADIOLOGY REPORT* Clinical Data: Fall, pain. RIGHT WRIST - COMPLETE 3+ VIEW Comparison: None. Findings: No acute bony or joint abnormality is identified.  The patient has advanced first [place] osteoarthritis.  Soft tissues unremarkable. IMPRESSION: No acute finding. Original Report Authenticated By: Arvid Right. Luther Parody, M.D.  Dg Hip 1 View Left  Result Date: 06/25/2011 *RADIOLOGY REPORT* Clinical Data: Postop right hip arthroplasty. PORTABLE LEFT HIP - 1 VIEW 06/25/2011 2222 hours: Comparison: Right hip x-rays earlier same date 1533 hours. Findings: Right hip arthroplasty with anatomic alignment in the AP projection.  No acute complicating features. IMPRESSION: Anatomic alignment post right  hip arthroplasty without acute complicating features. Original Report Authenticated By: Deniece Portela, M.D.  Dg Hip Complete Right  Result Date: 06/25/2011 *RADIOLOGY REPORT* Clinical Data: Pain.  Right hip deformity. RIGHT HIP - COMPLETE 2+ VIEW Comparison: None. Findings: The patient has a subcapital right femur fracture.  No other fracture is identified.  No dislocation. IMPRESSION: Subcapital right femur fracture. Per CMS PQRS reporting requirements (PQRS Measure 24): Given the patient's age of greater than 54 and the fracture site (hip,  distal radius, or spine), the patient should be tested for osteoporosis using DXA, and the appropriate treatment considered based on the DXA results. Original Report Authenticated By: Arvid Right. Luther Parody, M.D.  Dg Hand Complete Right  Result Date: 06/25/2011 *RADIOLOGY REPORT* Clinical Data: Fall, pain. RIGHT HAND - COMPLETE 3+ VIEW Comparison: None. Findings: No acute bony or joint abnormality is identified.  The patient has advanced first [place] osteoarthritis and marked degenerative change of the DIP joints.  Volar subluxation of the second and third MCP joints with hooking osteophytes also noted. No dislocation. IMPRESSION: No acute finding.  Multi focal severe degenerative change. Original Report Authenticated By: Arvid Right. Luther Parody, M.D.  Requested an H&P from Dr. Osborne Casco while pt in AL respite be provided to me for continuity.  I have not received this.  Assessment/Plan 1. Late onset Alzheimer's disease without behavioral disturbance -=moderate stage, now on namenda, is needing more help keeping track of meds, money, appt, etc. Family considering AL transition for her -will see how she does with PT, OT also  2. Moderate persistent asthma without complication -stable on current therapy and follows with pulmonary  3. Chronic fatigue -admits to this lately, weakness and falling -here for therapy  4. Generalized weakness -cont Pt, OT and see how  she does, but she sounds like she will need AL soon if family does not transition her now or cannot provide more home assistance  5. Major depressive disorder with single episode, in full remission ([place]) -cont SSRI, seems to be doing well from this perspective  6. Gastroesophageal reflux disease, esophagitis presence not specified -cont current PPI for symptoms management and due to asthma  Family/ staff Communication: discussed with AL nursing  Labs/tests ordered:  No new

## 2016-05-06 ENCOUNTER — Ambulatory Visit
Admission: RE | Admit: 2016-05-06 | Discharge: 2016-05-06 | Disposition: A | Discharge status: Home / Self Care | Payer: Medicare Other | Source: Ambulatory Visit | Attending: Internal Medicine | Admitting: Internal Medicine

## 2016-05-06 DIAGNOSIS — R1030 Lower abdominal pain, unspecified: Secondary | ICD-10-CM

## 2016-05-06 DIAGNOSIS — R110 Nausea: Secondary | ICD-10-CM

## 2016-05-06 DIAGNOSIS — N2889 Other specified disorders of kidney and ureter: Secondary | ICD-10-CM | POA: Diagnosis not present

## 2016-05-06 MED ORDER — IOPAMIDOL (ISOVUE-300) INJECTION 61%
100.0000 mL | Freq: Once | INTRAVENOUS | Status: AC | PRN
  Administered 2016-05-06: 100 mL via INTRAVENOUS

## 2016-05-07 DIAGNOSIS — M6281 Muscle weakness (generalized): Secondary | ICD-10-CM | POA: Diagnosis not present

## 2016-05-07 DIAGNOSIS — R2681 Unsteadiness on feet: Secondary | ICD-10-CM | POA: Diagnosis not present

## 2016-05-07 DIAGNOSIS — R4189 Other symptoms and signs involving cognitive functions and awareness: Secondary | ICD-10-CM | POA: Diagnosis not present

## 2016-05-07 DIAGNOSIS — R488 Other symbolic dysfunctions: Secondary | ICD-10-CM | POA: Diagnosis not present

## 2016-05-07 DIAGNOSIS — R2689 Other abnormalities of gait and mobility: Secondary | ICD-10-CM | POA: Diagnosis not present

## 2016-05-07 DIAGNOSIS — F0390 Unspecified dementia without behavioral disturbance: Secondary | ICD-10-CM | POA: Diagnosis not present

## 2016-05-07 DIAGNOSIS — J189 Pneumonia, unspecified organism: Secondary | ICD-10-CM | POA: Diagnosis not present

## 2016-05-07 DIAGNOSIS — Z9181 History of falling: Secondary | ICD-10-CM | POA: Diagnosis not present

## 2016-05-07 DIAGNOSIS — R278 Other lack of coordination: Secondary | ICD-10-CM | POA: Diagnosis not present

## 2016-05-07 DIAGNOSIS — J159 Unspecified bacterial pneumonia: Secondary | ICD-10-CM | POA: Diagnosis not present

## 2016-05-08 DIAGNOSIS — M6281 Muscle weakness (generalized): Secondary | ICD-10-CM | POA: Diagnosis not present

## 2016-05-08 DIAGNOSIS — R4189 Other symptoms and signs involving cognitive functions and awareness: Secondary | ICD-10-CM | POA: Diagnosis not present

## 2016-05-08 DIAGNOSIS — R278 Other lack of coordination: Secondary | ICD-10-CM | POA: Diagnosis not present

## 2016-05-08 DIAGNOSIS — R2689 Other abnormalities of gait and mobility: Secondary | ICD-10-CM | POA: Diagnosis not present

## 2016-05-08 DIAGNOSIS — R488 Other symbolic dysfunctions: Secondary | ICD-10-CM | POA: Diagnosis not present

## 2016-05-08 DIAGNOSIS — F0390 Unspecified dementia without behavioral disturbance: Secondary | ICD-10-CM | POA: Diagnosis not present

## 2016-05-11 DIAGNOSIS — R4189 Other symptoms and signs involving cognitive functions and awareness: Secondary | ICD-10-CM | POA: Diagnosis not present

## 2016-05-11 DIAGNOSIS — M6281 Muscle weakness (generalized): Secondary | ICD-10-CM | POA: Diagnosis not present

## 2016-05-11 DIAGNOSIS — R278 Other lack of coordination: Secondary | ICD-10-CM | POA: Diagnosis not present

## 2016-05-11 DIAGNOSIS — R488 Other symbolic dysfunctions: Secondary | ICD-10-CM | POA: Diagnosis not present

## 2016-05-11 DIAGNOSIS — R2689 Other abnormalities of gait and mobility: Secondary | ICD-10-CM | POA: Diagnosis not present

## 2016-05-11 DIAGNOSIS — F0390 Unspecified dementia without behavioral disturbance: Secondary | ICD-10-CM | POA: Diagnosis not present

## 2016-05-12 DIAGNOSIS — R2689 Other abnormalities of gait and mobility: Secondary | ICD-10-CM | POA: Diagnosis not present

## 2016-05-12 DIAGNOSIS — F0390 Unspecified dementia without behavioral disturbance: Secondary | ICD-10-CM | POA: Diagnosis not present

## 2016-05-12 DIAGNOSIS — R278 Other lack of coordination: Secondary | ICD-10-CM | POA: Diagnosis not present

## 2016-05-12 DIAGNOSIS — R4189 Other symptoms and signs involving cognitive functions and awareness: Secondary | ICD-10-CM | POA: Diagnosis not present

## 2016-05-12 DIAGNOSIS — M6281 Muscle weakness (generalized): Secondary | ICD-10-CM | POA: Diagnosis not present

## 2016-05-12 DIAGNOSIS — R488 Other symbolic dysfunctions: Secondary | ICD-10-CM | POA: Diagnosis not present

## 2016-05-13 DIAGNOSIS — F0390 Unspecified dementia without behavioral disturbance: Secondary | ICD-10-CM | POA: Diagnosis not present

## 2016-05-13 DIAGNOSIS — R488 Other symbolic dysfunctions: Secondary | ICD-10-CM | POA: Diagnosis not present

## 2016-05-13 DIAGNOSIS — M6281 Muscle weakness (generalized): Secondary | ICD-10-CM | POA: Diagnosis not present

## 2016-05-13 DIAGNOSIS — R4189 Other symptoms and signs involving cognitive functions and awareness: Secondary | ICD-10-CM | POA: Diagnosis not present

## 2016-05-13 DIAGNOSIS — R2689 Other abnormalities of gait and mobility: Secondary | ICD-10-CM | POA: Diagnosis not present

## 2016-05-13 DIAGNOSIS — R278 Other lack of coordination: Secondary | ICD-10-CM | POA: Diagnosis not present

## 2016-05-14 DIAGNOSIS — R278 Other lack of coordination: Secondary | ICD-10-CM | POA: Diagnosis not present

## 2016-05-14 DIAGNOSIS — M6281 Muscle weakness (generalized): Secondary | ICD-10-CM | POA: Diagnosis not present

## 2016-05-14 DIAGNOSIS — R4189 Other symptoms and signs involving cognitive functions and awareness: Secondary | ICD-10-CM | POA: Diagnosis not present

## 2016-05-14 DIAGNOSIS — F0390 Unspecified dementia without behavioral disturbance: Secondary | ICD-10-CM | POA: Diagnosis not present

## 2016-05-14 DIAGNOSIS — R488 Other symbolic dysfunctions: Secondary | ICD-10-CM | POA: Diagnosis not present

## 2016-05-14 DIAGNOSIS — R2689 Other abnormalities of gait and mobility: Secondary | ICD-10-CM | POA: Diagnosis not present

## 2016-05-15 DIAGNOSIS — R2689 Other abnormalities of gait and mobility: Secondary | ICD-10-CM | POA: Diagnosis not present

## 2016-05-15 DIAGNOSIS — R278 Other lack of coordination: Secondary | ICD-10-CM | POA: Diagnosis not present

## 2016-05-15 DIAGNOSIS — R488 Other symbolic dysfunctions: Secondary | ICD-10-CM | POA: Diagnosis not present

## 2016-05-15 DIAGNOSIS — F0390 Unspecified dementia without behavioral disturbance: Secondary | ICD-10-CM | POA: Diagnosis not present

## 2016-05-15 DIAGNOSIS — R4189 Other symptoms and signs involving cognitive functions and awareness: Secondary | ICD-10-CM | POA: Diagnosis not present

## 2016-05-15 DIAGNOSIS — M6281 Muscle weakness (generalized): Secondary | ICD-10-CM | POA: Diagnosis not present

## 2016-05-18 DIAGNOSIS — R488 Other symbolic dysfunctions: Secondary | ICD-10-CM | POA: Diagnosis not present

## 2016-05-18 DIAGNOSIS — R2689 Other abnormalities of gait and mobility: Secondary | ICD-10-CM | POA: Diagnosis not present

## 2016-05-18 DIAGNOSIS — M6281 Muscle weakness (generalized): Secondary | ICD-10-CM | POA: Diagnosis not present

## 2016-05-18 DIAGNOSIS — R4189 Other symptoms and signs involving cognitive functions and awareness: Secondary | ICD-10-CM | POA: Diagnosis not present

## 2016-05-18 DIAGNOSIS — F0390 Unspecified dementia without behavioral disturbance: Secondary | ICD-10-CM | POA: Diagnosis not present

## 2016-05-18 DIAGNOSIS — R278 Other lack of coordination: Secondary | ICD-10-CM | POA: Diagnosis not present

## 2016-05-19 ENCOUNTER — Other Ambulatory Visit: Payer: Self-pay

## 2016-05-19 DIAGNOSIS — R4189 Other symptoms and signs involving cognitive functions and awareness: Secondary | ICD-10-CM | POA: Diagnosis not present

## 2016-05-19 DIAGNOSIS — R278 Other lack of coordination: Secondary | ICD-10-CM | POA: Diagnosis not present

## 2016-05-19 DIAGNOSIS — R2689 Other abnormalities of gait and mobility: Secondary | ICD-10-CM | POA: Diagnosis not present

## 2016-05-19 DIAGNOSIS — R488 Other symbolic dysfunctions: Secondary | ICD-10-CM | POA: Diagnosis not present

## 2016-05-19 DIAGNOSIS — F0390 Unspecified dementia without behavioral disturbance: Secondary | ICD-10-CM | POA: Diagnosis not present

## 2016-05-19 DIAGNOSIS — M6281 Muscle weakness (generalized): Secondary | ICD-10-CM | POA: Diagnosis not present

## 2016-05-19 MED ORDER — MONTELUKAST SODIUM 10 MG PO TABS: 10.0000 mg | ORAL_TABLET | Freq: Every day | ORAL | 5 refills | Status: DC

## 2016-05-20 DIAGNOSIS — M6281 Muscle weakness (generalized): Secondary | ICD-10-CM | POA: Diagnosis not present

## 2016-05-20 DIAGNOSIS — R4189 Other symptoms and signs involving cognitive functions and awareness: Secondary | ICD-10-CM | POA: Diagnosis not present

## 2016-05-20 DIAGNOSIS — R2689 Other abnormalities of gait and mobility: Secondary | ICD-10-CM | POA: Diagnosis not present

## 2016-05-20 DIAGNOSIS — R488 Other symbolic dysfunctions: Secondary | ICD-10-CM | POA: Diagnosis not present

## 2016-05-20 DIAGNOSIS — R278 Other lack of coordination: Secondary | ICD-10-CM | POA: Diagnosis not present

## 2016-05-20 DIAGNOSIS — F0390 Unspecified dementia without behavioral disturbance: Secondary | ICD-10-CM | POA: Diagnosis not present

## 2016-05-21 DIAGNOSIS — R278 Other lack of coordination: Secondary | ICD-10-CM | POA: Diagnosis not present

## 2016-05-21 DIAGNOSIS — R2689 Other abnormalities of gait and mobility: Secondary | ICD-10-CM | POA: Diagnosis not present

## 2016-05-21 DIAGNOSIS — R488 Other symbolic dysfunctions: Secondary | ICD-10-CM | POA: Diagnosis not present

## 2016-05-21 DIAGNOSIS — M6281 Muscle weakness (generalized): Secondary | ICD-10-CM | POA: Diagnosis not present

## 2016-05-21 DIAGNOSIS — R4189 Other symptoms and signs involving cognitive functions and awareness: Secondary | ICD-10-CM | POA: Diagnosis not present

## 2016-05-21 DIAGNOSIS — F0390 Unspecified dementia without behavioral disturbance: Secondary | ICD-10-CM | POA: Diagnosis not present

## 2016-05-22 DIAGNOSIS — F0390 Unspecified dementia without behavioral disturbance: Secondary | ICD-10-CM | POA: Diagnosis not present

## 2016-05-22 DIAGNOSIS — R278 Other lack of coordination: Secondary | ICD-10-CM | POA: Diagnosis not present

## 2016-05-22 DIAGNOSIS — R2689 Other abnormalities of gait and mobility: Secondary | ICD-10-CM | POA: Diagnosis not present

## 2016-05-22 DIAGNOSIS — R4189 Other symptoms and signs involving cognitive functions and awareness: Secondary | ICD-10-CM | POA: Diagnosis not present

## 2016-05-22 DIAGNOSIS — M6281 Muscle weakness (generalized): Secondary | ICD-10-CM | POA: Diagnosis not present

## 2016-05-22 DIAGNOSIS — R488 Other symbolic dysfunctions: Secondary | ICD-10-CM | POA: Diagnosis not present

## 2016-05-25 DIAGNOSIS — R488 Other symbolic dysfunctions: Secondary | ICD-10-CM | POA: Diagnosis not present

## 2016-05-25 DIAGNOSIS — R278 Other lack of coordination: Secondary | ICD-10-CM | POA: Diagnosis not present

## 2016-05-25 DIAGNOSIS — M6281 Muscle weakness (generalized): Secondary | ICD-10-CM | POA: Diagnosis not present

## 2016-05-25 DIAGNOSIS — F0390 Unspecified dementia without behavioral disturbance: Secondary | ICD-10-CM | POA: Diagnosis not present

## 2016-05-25 DIAGNOSIS — R2689 Other abnormalities of gait and mobility: Secondary | ICD-10-CM | POA: Diagnosis not present

## 2016-05-25 DIAGNOSIS — R4189 Other symptoms and signs involving cognitive functions and awareness: Secondary | ICD-10-CM | POA: Diagnosis not present

## 2016-05-26 DIAGNOSIS — M6281 Muscle weakness (generalized): Secondary | ICD-10-CM | POA: Diagnosis not present

## 2016-05-26 DIAGNOSIS — R278 Other lack of coordination: Secondary | ICD-10-CM | POA: Diagnosis not present

## 2016-05-26 DIAGNOSIS — R531 Weakness: Secondary | ICD-10-CM | POA: Insufficient documentation

## 2016-05-26 DIAGNOSIS — R2689 Other abnormalities of gait and mobility: Secondary | ICD-10-CM | POA: Diagnosis not present

## 2016-05-26 DIAGNOSIS — R4189 Other symptoms and signs involving cognitive functions and awareness: Secondary | ICD-10-CM | POA: Diagnosis not present

## 2016-05-26 DIAGNOSIS — R488 Other symbolic dysfunctions: Secondary | ICD-10-CM | POA: Diagnosis not present

## 2016-05-26 DIAGNOSIS — F0390 Unspecified dementia without behavioral disturbance: Secondary | ICD-10-CM | POA: Diagnosis not present

## 2016-05-27 DIAGNOSIS — R4189 Other symptoms and signs involving cognitive functions and awareness: Secondary | ICD-10-CM | POA: Diagnosis not present

## 2016-05-27 DIAGNOSIS — R2689 Other abnormalities of gait and mobility: Secondary | ICD-10-CM | POA: Diagnosis not present

## 2016-05-27 DIAGNOSIS — M6281 Muscle weakness (generalized): Secondary | ICD-10-CM | POA: Diagnosis not present

## 2016-05-27 DIAGNOSIS — R278 Other lack of coordination: Secondary | ICD-10-CM | POA: Diagnosis not present

## 2016-05-27 DIAGNOSIS — F0390 Unspecified dementia without behavioral disturbance: Secondary | ICD-10-CM | POA: Diagnosis not present

## 2016-05-27 DIAGNOSIS — R488 Other symbolic dysfunctions: Secondary | ICD-10-CM | POA: Diagnosis not present

## 2016-05-28 DIAGNOSIS — R278 Other lack of coordination: Secondary | ICD-10-CM | POA: Diagnosis not present

## 2016-05-28 DIAGNOSIS — R2689 Other abnormalities of gait and mobility: Secondary | ICD-10-CM | POA: Diagnosis not present

## 2016-05-28 DIAGNOSIS — R4189 Other symptoms and signs involving cognitive functions and awareness: Secondary | ICD-10-CM | POA: Diagnosis not present

## 2016-05-28 DIAGNOSIS — R488 Other symbolic dysfunctions: Secondary | ICD-10-CM | POA: Diagnosis not present

## 2016-05-28 DIAGNOSIS — F0390 Unspecified dementia without behavioral disturbance: Secondary | ICD-10-CM | POA: Diagnosis not present

## 2016-05-28 DIAGNOSIS — M6281 Muscle weakness (generalized): Secondary | ICD-10-CM | POA: Diagnosis not present

## 2016-05-29 DIAGNOSIS — E559 Vitamin D deficiency, unspecified: Secondary | ICD-10-CM | POA: Diagnosis not present

## 2016-05-29 DIAGNOSIS — I10 Essential (primary) hypertension: Secondary | ICD-10-CM | POA: Diagnosis not present

## 2016-05-29 DIAGNOSIS — M810 Age-related osteoporosis without current pathological fracture: Secondary | ICD-10-CM | POA: Diagnosis not present

## 2016-05-29 DIAGNOSIS — E538 Deficiency of other specified B group vitamins: Secondary | ICD-10-CM | POA: Diagnosis not present

## 2016-05-29 DIAGNOSIS — E871 Hypo-osmolality and hyponatremia: Secondary | ICD-10-CM | POA: Diagnosis not present

## 2016-05-29 DIAGNOSIS — Z6821 Body mass index (BMI) 21.0-21.9, adult: Secondary | ICD-10-CM | POA: Diagnosis not present

## 2016-05-29 DIAGNOSIS — R413 Other amnesia: Secondary | ICD-10-CM | POA: Diagnosis not present

## 2016-05-29 DIAGNOSIS — R159 Full incontinence of feces: Secondary | ICD-10-CM | POA: Diagnosis not present

## 2016-05-29 DIAGNOSIS — I129 Hypertensive chronic kidney disease with stage 1 through stage 4 chronic kidney disease, or unspecified chronic kidney disease: Secondary | ICD-10-CM | POA: Diagnosis not present

## 2016-05-29 DIAGNOSIS — J4540 Moderate persistent asthma, uncomplicated: Secondary | ICD-10-CM | POA: Diagnosis not present

## 2016-05-29 DIAGNOSIS — N183 Chronic kidney disease, stage 3 (moderate): Secondary | ICD-10-CM | POA: Diagnosis not present

## 2016-05-29 DIAGNOSIS — M1990 Unspecified osteoarthritis, unspecified site: Secondary | ICD-10-CM | POA: Diagnosis not present

## 2016-06-01 DIAGNOSIS — R2689 Other abnormalities of gait and mobility: Secondary | ICD-10-CM | POA: Diagnosis not present

## 2016-06-01 DIAGNOSIS — F0390 Unspecified dementia without behavioral disturbance: Secondary | ICD-10-CM | POA: Diagnosis not present

## 2016-06-01 DIAGNOSIS — M6281 Muscle weakness (generalized): Secondary | ICD-10-CM | POA: Diagnosis not present

## 2016-06-01 DIAGNOSIS — R4189 Other symptoms and signs involving cognitive functions and awareness: Secondary | ICD-10-CM | POA: Diagnosis not present

## 2016-06-01 DIAGNOSIS — R278 Other lack of coordination: Secondary | ICD-10-CM | POA: Diagnosis not present

## 2016-06-01 DIAGNOSIS — R488 Other symbolic dysfunctions: Secondary | ICD-10-CM | POA: Diagnosis not present

## 2016-06-02 DIAGNOSIS — R278 Other lack of coordination: Secondary | ICD-10-CM | POA: Diagnosis not present

## 2016-06-02 DIAGNOSIS — R488 Other symbolic dysfunctions: Secondary | ICD-10-CM | POA: Diagnosis not present

## 2016-06-02 DIAGNOSIS — F0390 Unspecified dementia without behavioral disturbance: Secondary | ICD-10-CM | POA: Diagnosis not present

## 2016-06-02 DIAGNOSIS — R2689 Other abnormalities of gait and mobility: Secondary | ICD-10-CM | POA: Diagnosis not present

## 2016-06-02 DIAGNOSIS — R4189 Other symptoms and signs involving cognitive functions and awareness: Secondary | ICD-10-CM | POA: Diagnosis not present

## 2016-06-02 DIAGNOSIS — M6281 Muscle weakness (generalized): Secondary | ICD-10-CM | POA: Diagnosis not present

## 2016-06-04 DIAGNOSIS — J159 Unspecified bacterial pneumonia: Secondary | ICD-10-CM | POA: Diagnosis not present

## 2016-06-04 DIAGNOSIS — Z9181 History of falling: Secondary | ICD-10-CM | POA: Diagnosis not present

## 2016-06-04 DIAGNOSIS — R278 Other lack of coordination: Secondary | ICD-10-CM | POA: Diagnosis not present

## 2016-06-04 DIAGNOSIS — M6281 Muscle weakness (generalized): Secondary | ICD-10-CM | POA: Diagnosis not present

## 2016-06-04 DIAGNOSIS — R2681 Unsteadiness on feet: Secondary | ICD-10-CM | POA: Diagnosis not present

## 2016-06-04 DIAGNOSIS — R488 Other symbolic dysfunctions: Secondary | ICD-10-CM | POA: Diagnosis not present

## 2016-06-04 DIAGNOSIS — R4189 Other symptoms and signs involving cognitive functions and awareness: Secondary | ICD-10-CM | POA: Diagnosis not present

## 2016-06-04 DIAGNOSIS — J189 Pneumonia, unspecified organism: Secondary | ICD-10-CM | POA: Diagnosis not present

## 2016-06-04 DIAGNOSIS — F0390 Unspecified dementia without behavioral disturbance: Secondary | ICD-10-CM | POA: Diagnosis not present

## 2016-06-04 DIAGNOSIS — R2689 Other abnormalities of gait and mobility: Secondary | ICD-10-CM | POA: Diagnosis not present

## 2016-06-08 DIAGNOSIS — R488 Other symbolic dysfunctions: Secondary | ICD-10-CM | POA: Diagnosis not present

## 2016-06-08 DIAGNOSIS — R278 Other lack of coordination: Secondary | ICD-10-CM | POA: Diagnosis not present

## 2016-06-08 DIAGNOSIS — R4189 Other symptoms and signs involving cognitive functions and awareness: Secondary | ICD-10-CM | POA: Diagnosis not present

## 2016-06-08 DIAGNOSIS — M6281 Muscle weakness (generalized): Secondary | ICD-10-CM | POA: Diagnosis not present

## 2016-06-08 DIAGNOSIS — F0390 Unspecified dementia without behavioral disturbance: Secondary | ICD-10-CM | POA: Diagnosis not present

## 2016-06-08 DIAGNOSIS — R2689 Other abnormalities of gait and mobility: Secondary | ICD-10-CM | POA: Diagnosis not present

## 2016-06-09 ENCOUNTER — Encounter: Payer: Self-pay | Admitting: Internal Medicine

## 2016-06-09 NOTE — Progress Notes (Signed)
Patient ID: [name], female   DOB: 1924-05-30, 82 y.o.   MRN: [id number] A user error has taken place:

## 2016-06-10 DIAGNOSIS — R2689 Other abnormalities of gait and mobility: Secondary | ICD-10-CM | POA: Diagnosis not present

## 2016-06-10 DIAGNOSIS — F0390 Unspecified dementia without behavioral disturbance: Secondary | ICD-10-CM | POA: Diagnosis not present

## 2016-06-10 DIAGNOSIS — R488 Other symbolic dysfunctions: Secondary | ICD-10-CM | POA: Diagnosis not present

## 2016-06-10 DIAGNOSIS — R278 Other lack of coordination: Secondary | ICD-10-CM | POA: Diagnosis not present

## 2016-06-10 DIAGNOSIS — M6281 Muscle weakness (generalized): Secondary | ICD-10-CM | POA: Diagnosis not present

## 2016-06-10 DIAGNOSIS — R4189 Other symptoms and signs involving cognitive functions and awareness: Secondary | ICD-10-CM | POA: Diagnosis not present

## 2016-06-11 DIAGNOSIS — R4189 Other symptoms and signs involving cognitive functions and awareness: Secondary | ICD-10-CM | POA: Diagnosis not present

## 2016-06-11 DIAGNOSIS — R278 Other lack of coordination: Secondary | ICD-10-CM | POA: Diagnosis not present

## 2016-06-11 DIAGNOSIS — M6281 Muscle weakness (generalized): Secondary | ICD-10-CM | POA: Diagnosis not present

## 2016-06-11 DIAGNOSIS — R488 Other symbolic dysfunctions: Secondary | ICD-10-CM | POA: Diagnosis not present

## 2016-06-11 DIAGNOSIS — R2689 Other abnormalities of gait and mobility: Secondary | ICD-10-CM | POA: Diagnosis not present

## 2016-06-11 DIAGNOSIS — F0390 Unspecified dementia without behavioral disturbance: Secondary | ICD-10-CM | POA: Diagnosis not present

## 2016-06-12 DIAGNOSIS — R2689 Other abnormalities of gait and mobility: Secondary | ICD-10-CM | POA: Diagnosis not present

## 2016-06-12 DIAGNOSIS — M6281 Muscle weakness (generalized): Secondary | ICD-10-CM | POA: Diagnosis not present

## 2016-06-12 DIAGNOSIS — F0390 Unspecified dementia without behavioral disturbance: Secondary | ICD-10-CM | POA: Diagnosis not present

## 2016-06-12 DIAGNOSIS — R278 Other lack of coordination: Secondary | ICD-10-CM | POA: Diagnosis not present

## 2016-06-12 DIAGNOSIS — R4189 Other symptoms and signs involving cognitive functions and awareness: Secondary | ICD-10-CM | POA: Diagnosis not present

## 2016-06-12 DIAGNOSIS — R488 Other symbolic dysfunctions: Secondary | ICD-10-CM | POA: Diagnosis not present

## 2016-06-14 DIAGNOSIS — M6281 Muscle weakness (generalized): Secondary | ICD-10-CM | POA: Diagnosis not present

## 2016-06-14 DIAGNOSIS — R488 Other symbolic dysfunctions: Secondary | ICD-10-CM | POA: Diagnosis not present

## 2016-06-14 DIAGNOSIS — F0390 Unspecified dementia without behavioral disturbance: Secondary | ICD-10-CM | POA: Diagnosis not present

## 2016-06-14 DIAGNOSIS — R278 Other lack of coordination: Secondary | ICD-10-CM | POA: Diagnosis not present

## 2016-06-14 DIAGNOSIS — R4189 Other symptoms and signs involving cognitive functions and awareness: Secondary | ICD-10-CM | POA: Diagnosis not present

## 2016-06-14 DIAGNOSIS — R2689 Other abnormalities of gait and mobility: Secondary | ICD-10-CM | POA: Diagnosis not present

## 2016-06-15 DIAGNOSIS — R488 Other symbolic dysfunctions: Secondary | ICD-10-CM | POA: Diagnosis not present

## 2016-06-15 DIAGNOSIS — R4189 Other symptoms and signs involving cognitive functions and awareness: Secondary | ICD-10-CM | POA: Diagnosis not present

## 2016-06-15 DIAGNOSIS — R2689 Other abnormalities of gait and mobility: Secondary | ICD-10-CM | POA: Diagnosis not present

## 2016-06-15 DIAGNOSIS — R278 Other lack of coordination: Secondary | ICD-10-CM | POA: Diagnosis not present

## 2016-06-15 DIAGNOSIS — F0390 Unspecified dementia without behavioral disturbance: Secondary | ICD-10-CM | POA: Diagnosis not present

## 2016-06-15 DIAGNOSIS — M6281 Muscle weakness (generalized): Secondary | ICD-10-CM | POA: Diagnosis not present

## 2016-06-16 DIAGNOSIS — R4189 Other symptoms and signs involving cognitive functions and awareness: Secondary | ICD-10-CM | POA: Diagnosis not present

## 2016-06-16 DIAGNOSIS — F0390 Unspecified dementia without behavioral disturbance: Secondary | ICD-10-CM | POA: Diagnosis not present

## 2016-06-16 DIAGNOSIS — M6281 Muscle weakness (generalized): Secondary | ICD-10-CM | POA: Diagnosis not present

## 2016-06-16 DIAGNOSIS — R2689 Other abnormalities of gait and mobility: Secondary | ICD-10-CM | POA: Diagnosis not present

## 2016-06-16 DIAGNOSIS — R278 Other lack of coordination: Secondary | ICD-10-CM | POA: Diagnosis not present

## 2016-06-16 DIAGNOSIS — R488 Other symbolic dysfunctions: Secondary | ICD-10-CM | POA: Diagnosis not present

## 2016-06-17 ENCOUNTER — Ambulatory Visit (INDEPENDENT_AMBULATORY_CARE_PROVIDER_SITE_OTHER): Payer: Medicare Other

## 2016-06-17 DIAGNOSIS — M6281 Muscle weakness (generalized): Secondary | ICD-10-CM | POA: Diagnosis not present

## 2016-06-17 DIAGNOSIS — R488 Other symbolic dysfunctions: Secondary | ICD-10-CM | POA: Diagnosis not present

## 2016-06-17 DIAGNOSIS — F0390 Unspecified dementia without behavioral disturbance: Secondary | ICD-10-CM | POA: Diagnosis not present

## 2016-06-17 DIAGNOSIS — J4540 Moderate persistent asthma, uncomplicated: Secondary | ICD-10-CM | POA: Diagnosis not present

## 2016-06-17 DIAGNOSIS — R278 Other lack of coordination: Secondary | ICD-10-CM | POA: Diagnosis not present

## 2016-06-17 DIAGNOSIS — R4189 Other symptoms and signs involving cognitive functions and awareness: Secondary | ICD-10-CM | POA: Diagnosis not present

## 2016-06-17 DIAGNOSIS — R2689 Other abnormalities of gait and mobility: Secondary | ICD-10-CM | POA: Diagnosis not present

## 2016-06-18 DIAGNOSIS — R4189 Other symptoms and signs involving cognitive functions and awareness: Secondary | ICD-10-CM | POA: Diagnosis not present

## 2016-06-18 DIAGNOSIS — R2689 Other abnormalities of gait and mobility: Secondary | ICD-10-CM | POA: Diagnosis not present

## 2016-06-18 DIAGNOSIS — M6281 Muscle weakness (generalized): Secondary | ICD-10-CM | POA: Diagnosis not present

## 2016-06-18 DIAGNOSIS — R488 Other symbolic dysfunctions: Secondary | ICD-10-CM | POA: Diagnosis not present

## 2016-06-18 DIAGNOSIS — F0390 Unspecified dementia without behavioral disturbance: Secondary | ICD-10-CM | POA: Diagnosis not present

## 2016-06-18 DIAGNOSIS — R278 Other lack of coordination: Secondary | ICD-10-CM | POA: Diagnosis not present

## 2016-06-19 DIAGNOSIS — M6281 Muscle weakness (generalized): Secondary | ICD-10-CM | POA: Diagnosis not present

## 2016-06-19 DIAGNOSIS — R488 Other symbolic dysfunctions: Secondary | ICD-10-CM | POA: Diagnosis not present

## 2016-06-19 DIAGNOSIS — R4189 Other symptoms and signs involving cognitive functions and awareness: Secondary | ICD-10-CM | POA: Diagnosis not present

## 2016-06-19 DIAGNOSIS — F0390 Unspecified dementia without behavioral disturbance: Secondary | ICD-10-CM | POA: Diagnosis not present

## 2016-06-19 DIAGNOSIS — R278 Other lack of coordination: Secondary | ICD-10-CM | POA: Diagnosis not present

## 2016-06-19 DIAGNOSIS — R2689 Other abnormalities of gait and mobility: Secondary | ICD-10-CM | POA: Diagnosis not present

## 2016-06-21 DIAGNOSIS — F0390 Unspecified dementia without behavioral disturbance: Secondary | ICD-10-CM | POA: Diagnosis not present

## 2016-06-21 DIAGNOSIS — R488 Other symbolic dysfunctions: Secondary | ICD-10-CM | POA: Diagnosis not present

## 2016-06-21 DIAGNOSIS — R2689 Other abnormalities of gait and mobility: Secondary | ICD-10-CM | POA: Diagnosis not present

## 2016-06-21 DIAGNOSIS — R4189 Other symptoms and signs involving cognitive functions and awareness: Secondary | ICD-10-CM | POA: Diagnosis not present

## 2016-06-21 DIAGNOSIS — M6281 Muscle weakness (generalized): Secondary | ICD-10-CM | POA: Diagnosis not present

## 2016-06-21 DIAGNOSIS — R278 Other lack of coordination: Secondary | ICD-10-CM | POA: Diagnosis not present

## 2016-06-22 DIAGNOSIS — F0390 Unspecified dementia without behavioral disturbance: Secondary | ICD-10-CM | POA: Diagnosis not present

## 2016-06-22 DIAGNOSIS — M6281 Muscle weakness (generalized): Secondary | ICD-10-CM | POA: Diagnosis not present

## 2016-06-22 DIAGNOSIS — R488 Other symbolic dysfunctions: Secondary | ICD-10-CM | POA: Diagnosis not present

## 2016-06-22 DIAGNOSIS — R2689 Other abnormalities of gait and mobility: Secondary | ICD-10-CM | POA: Diagnosis not present

## 2016-06-22 DIAGNOSIS — R278 Other lack of coordination: Secondary | ICD-10-CM | POA: Diagnosis not present

## 2016-06-22 DIAGNOSIS — R4189 Other symptoms and signs involving cognitive functions and awareness: Secondary | ICD-10-CM | POA: Diagnosis not present

## 2016-06-23 DIAGNOSIS — M6281 Muscle weakness (generalized): Secondary | ICD-10-CM | POA: Diagnosis not present

## 2016-06-23 DIAGNOSIS — F0390 Unspecified dementia without behavioral disturbance: Secondary | ICD-10-CM | POA: Diagnosis not present

## 2016-06-23 DIAGNOSIS — R2689 Other abnormalities of gait and mobility: Secondary | ICD-10-CM | POA: Diagnosis not present

## 2016-06-23 DIAGNOSIS — R278 Other lack of coordination: Secondary | ICD-10-CM | POA: Diagnosis not present

## 2016-06-23 DIAGNOSIS — R4189 Other symptoms and signs involving cognitive functions and awareness: Secondary | ICD-10-CM | POA: Diagnosis not present

## 2016-06-23 DIAGNOSIS — R488 Other symbolic dysfunctions: Secondary | ICD-10-CM | POA: Diagnosis not present

## 2016-06-23 MED ORDER — OMALIZUMAB 150 MG ~~LOC~~ SOLR
300.0000 mg | Freq: Once | SUBCUTANEOUS | Status: AC
  Administered 2016-06-17: 300 mg via SUBCUTANEOUS

## 2016-06-24 DIAGNOSIS — R488 Other symbolic dysfunctions: Secondary | ICD-10-CM | POA: Diagnosis not present

## 2016-06-24 DIAGNOSIS — F0390 Unspecified dementia without behavioral disturbance: Secondary | ICD-10-CM | POA: Diagnosis not present

## 2016-06-24 DIAGNOSIS — M6281 Muscle weakness (generalized): Secondary | ICD-10-CM | POA: Diagnosis not present

## 2016-06-24 DIAGNOSIS — R2689 Other abnormalities of gait and mobility: Secondary | ICD-10-CM | POA: Diagnosis not present

## 2016-06-24 DIAGNOSIS — R278 Other lack of coordination: Secondary | ICD-10-CM | POA: Diagnosis not present

## 2016-06-24 DIAGNOSIS — R4189 Other symptoms and signs involving cognitive functions and awareness: Secondary | ICD-10-CM | POA: Diagnosis not present

## 2016-06-29 DIAGNOSIS — F0390 Unspecified dementia without behavioral disturbance: Secondary | ICD-10-CM | POA: Diagnosis not present

## 2016-06-29 DIAGNOSIS — R278 Other lack of coordination: Secondary | ICD-10-CM | POA: Diagnosis not present

## 2016-06-29 DIAGNOSIS — R2689 Other abnormalities of gait and mobility: Secondary | ICD-10-CM | POA: Diagnosis not present

## 2016-06-29 DIAGNOSIS — R488 Other symbolic dysfunctions: Secondary | ICD-10-CM | POA: Diagnosis not present

## 2016-06-29 DIAGNOSIS — R4189 Other symptoms and signs involving cognitive functions and awareness: Secondary | ICD-10-CM | POA: Diagnosis not present

## 2016-06-29 DIAGNOSIS — M6281 Muscle weakness (generalized): Secondary | ICD-10-CM | POA: Diagnosis not present

## 2016-06-30 DIAGNOSIS — R278 Other lack of coordination: Secondary | ICD-10-CM | POA: Diagnosis not present

## 2016-06-30 DIAGNOSIS — R4189 Other symptoms and signs involving cognitive functions and awareness: Secondary | ICD-10-CM | POA: Diagnosis not present

## 2016-06-30 DIAGNOSIS — R488 Other symbolic dysfunctions: Secondary | ICD-10-CM | POA: Diagnosis not present

## 2016-06-30 DIAGNOSIS — M6281 Muscle weakness (generalized): Secondary | ICD-10-CM | POA: Diagnosis not present

## 2016-06-30 DIAGNOSIS — R2689 Other abnormalities of gait and mobility: Secondary | ICD-10-CM | POA: Diagnosis not present

## 2016-06-30 DIAGNOSIS — F0390 Unspecified dementia without behavioral disturbance: Secondary | ICD-10-CM | POA: Diagnosis not present

## 2016-07-01 ENCOUNTER — Ambulatory Visit (INDEPENDENT_AMBULATORY_CARE_PROVIDER_SITE_OTHER): Payer: Medicare Other

## 2016-07-01 DIAGNOSIS — R278 Other lack of coordination: Secondary | ICD-10-CM | POA: Diagnosis not present

## 2016-07-01 DIAGNOSIS — M6281 Muscle weakness (generalized): Secondary | ICD-10-CM | POA: Diagnosis not present

## 2016-07-01 DIAGNOSIS — R488 Other symbolic dysfunctions: Secondary | ICD-10-CM | POA: Diagnosis not present

## 2016-07-01 DIAGNOSIS — F0390 Unspecified dementia without behavioral disturbance: Secondary | ICD-10-CM | POA: Diagnosis not present

## 2016-07-01 DIAGNOSIS — R2689 Other abnormalities of gait and mobility: Secondary | ICD-10-CM | POA: Diagnosis not present

## 2016-07-01 DIAGNOSIS — R4189 Other symptoms and signs involving cognitive functions and awareness: Secondary | ICD-10-CM | POA: Diagnosis not present

## 2016-07-01 DIAGNOSIS — J4540 Moderate persistent asthma, uncomplicated: Secondary | ICD-10-CM | POA: Diagnosis not present

## 2016-07-01 MED ORDER — OMALIZUMAB 150 MG ~~LOC~~ SOLR
300.0000 mg | SUBCUTANEOUS | Status: DC
  Administered 2016-07-01: 300 mg via SUBCUTANEOUS

## 2016-07-02 DIAGNOSIS — F0390 Unspecified dementia without behavioral disturbance: Secondary | ICD-10-CM | POA: Diagnosis not present

## 2016-07-02 DIAGNOSIS — M6281 Muscle weakness (generalized): Secondary | ICD-10-CM | POA: Diagnosis not present

## 2016-07-02 DIAGNOSIS — R2689 Other abnormalities of gait and mobility: Secondary | ICD-10-CM | POA: Diagnosis not present

## 2016-07-02 DIAGNOSIS — R488 Other symbolic dysfunctions: Secondary | ICD-10-CM | POA: Diagnosis not present

## 2016-07-02 DIAGNOSIS — R278 Other lack of coordination: Secondary | ICD-10-CM | POA: Diagnosis not present

## 2016-07-02 DIAGNOSIS — R4189 Other symptoms and signs involving cognitive functions and awareness: Secondary | ICD-10-CM | POA: Diagnosis not present

## 2016-07-03 DIAGNOSIS — F0390 Unspecified dementia without behavioral disturbance: Secondary | ICD-10-CM | POA: Diagnosis not present

## 2016-07-03 DIAGNOSIS — M6281 Muscle weakness (generalized): Secondary | ICD-10-CM | POA: Diagnosis not present

## 2016-07-03 DIAGNOSIS — R488 Other symbolic dysfunctions: Secondary | ICD-10-CM | POA: Diagnosis not present

## 2016-07-03 DIAGNOSIS — J189 Pneumonia, unspecified organism: Secondary | ICD-10-CM | POA: Diagnosis not present

## 2016-07-03 DIAGNOSIS — R4189 Other symptoms and signs involving cognitive functions and awareness: Secondary | ICD-10-CM | POA: Diagnosis not present

## 2016-07-03 DIAGNOSIS — R278 Other lack of coordination: Secondary | ICD-10-CM | POA: Diagnosis not present

## 2016-07-06 DIAGNOSIS — M6281 Muscle weakness (generalized): Secondary | ICD-10-CM | POA: Diagnosis not present

## 2016-07-06 DIAGNOSIS — F0390 Unspecified dementia without behavioral disturbance: Secondary | ICD-10-CM | POA: Diagnosis not present

## 2016-07-06 DIAGNOSIS — R488 Other symbolic dysfunctions: Secondary | ICD-10-CM | POA: Diagnosis not present

## 2016-07-06 DIAGNOSIS — R278 Other lack of coordination: Secondary | ICD-10-CM | POA: Diagnosis not present

## 2016-07-06 DIAGNOSIS — R4189 Other symptoms and signs involving cognitive functions and awareness: Secondary | ICD-10-CM | POA: Diagnosis not present

## 2016-07-06 DIAGNOSIS — J189 Pneumonia, unspecified organism: Secondary | ICD-10-CM | POA: Diagnosis not present

## 2016-07-07 DIAGNOSIS — R278 Other lack of coordination: Secondary | ICD-10-CM | POA: Diagnosis not present

## 2016-07-07 DIAGNOSIS — R4189 Other symptoms and signs involving cognitive functions and awareness: Secondary | ICD-10-CM | POA: Diagnosis not present

## 2016-07-07 DIAGNOSIS — M6281 Muscle weakness (generalized): Secondary | ICD-10-CM | POA: Diagnosis not present

## 2016-07-07 DIAGNOSIS — R488 Other symbolic dysfunctions: Secondary | ICD-10-CM | POA: Diagnosis not present

## 2016-07-07 DIAGNOSIS — J189 Pneumonia, unspecified organism: Secondary | ICD-10-CM | POA: Diagnosis not present

## 2016-07-07 DIAGNOSIS — F0390 Unspecified dementia without behavioral disturbance: Secondary | ICD-10-CM | POA: Diagnosis not present

## 2016-07-08 DIAGNOSIS — R278 Other lack of coordination: Secondary | ICD-10-CM | POA: Diagnosis not present

## 2016-07-08 DIAGNOSIS — R4189 Other symptoms and signs involving cognitive functions and awareness: Secondary | ICD-10-CM | POA: Diagnosis not present

## 2016-07-08 DIAGNOSIS — J189 Pneumonia, unspecified organism: Secondary | ICD-10-CM | POA: Diagnosis not present

## 2016-07-08 DIAGNOSIS — R488 Other symbolic dysfunctions: Secondary | ICD-10-CM | POA: Diagnosis not present

## 2016-07-08 DIAGNOSIS — M6281 Muscle weakness (generalized): Secondary | ICD-10-CM | POA: Diagnosis not present

## 2016-07-08 DIAGNOSIS — F0390 Unspecified dementia without behavioral disturbance: Secondary | ICD-10-CM | POA: Diagnosis not present

## 2016-07-10 DIAGNOSIS — R278 Other lack of coordination: Secondary | ICD-10-CM | POA: Diagnosis not present

## 2016-07-10 DIAGNOSIS — J189 Pneumonia, unspecified organism: Secondary | ICD-10-CM | POA: Diagnosis not present

## 2016-07-10 DIAGNOSIS — F0390 Unspecified dementia without behavioral disturbance: Secondary | ICD-10-CM | POA: Diagnosis not present

## 2016-07-10 DIAGNOSIS — M6281 Muscle weakness (generalized): Secondary | ICD-10-CM | POA: Diagnosis not present

## 2016-07-10 DIAGNOSIS — R488 Other symbolic dysfunctions: Secondary | ICD-10-CM | POA: Diagnosis not present

## 2016-07-10 DIAGNOSIS — R4189 Other symptoms and signs involving cognitive functions and awareness: Secondary | ICD-10-CM | POA: Diagnosis not present

## 2016-07-13 DIAGNOSIS — R278 Other lack of coordination: Secondary | ICD-10-CM | POA: Diagnosis not present

## 2016-07-13 DIAGNOSIS — J189 Pneumonia, unspecified organism: Secondary | ICD-10-CM | POA: Diagnosis not present

## 2016-07-13 DIAGNOSIS — M6281 Muscle weakness (generalized): Secondary | ICD-10-CM | POA: Diagnosis not present

## 2016-07-13 DIAGNOSIS — R488 Other symbolic dysfunctions: Secondary | ICD-10-CM | POA: Diagnosis not present

## 2016-07-13 DIAGNOSIS — R4189 Other symptoms and signs involving cognitive functions and awareness: Secondary | ICD-10-CM | POA: Diagnosis not present

## 2016-07-13 DIAGNOSIS — F0390 Unspecified dementia without behavioral disturbance: Secondary | ICD-10-CM | POA: Diagnosis not present

## 2016-07-15 ENCOUNTER — Ambulatory Visit (INDEPENDENT_AMBULATORY_CARE_PROVIDER_SITE_OTHER): Payer: Medicare Other

## 2016-07-15 DIAGNOSIS — J4540 Moderate persistent asthma, uncomplicated: Secondary | ICD-10-CM

## 2016-07-15 MED ORDER — OMALIZUMAB 150 MG ~~LOC~~ SOLR
300.0000 mg | SUBCUTANEOUS | Status: DC
  Administered 2016-07-15: 300 mg via SUBCUTANEOUS

## 2016-07-16 ENCOUNTER — Telehealth: Payer: Self-pay | Admitting: Pulmonary Disease

## 2016-07-16 NOTE — Telephone Encounter (Signed)
#   vials:4 Ordered date:07/16/16 Shipping Date:07/17/16

## 2016-07-17 NOTE — Telephone Encounter (Signed)
#   Vials:4 Arrival Date:07/17/16 Lot #:[id number] Exp Date:6/21

## 2016-07-27 DIAGNOSIS — M25559 Pain in unspecified hip: Secondary | ICD-10-CM | POA: Diagnosis not present

## 2016-07-27 DIAGNOSIS — M533 Sacrococcygeal disorders, not elsewhere classified: Secondary | ICD-10-CM | POA: Diagnosis not present

## 2016-07-29 ENCOUNTER — Ambulatory Visit: Payer: Self-pay

## 2016-07-30 DIAGNOSIS — E876 Hypokalemia: Secondary | ICD-10-CM | POA: Diagnosis not present

## 2016-07-30 DIAGNOSIS — E538 Deficiency of other specified B group vitamins: Secondary | ICD-10-CM | POA: Diagnosis not present

## 2016-07-30 DIAGNOSIS — R319 Hematuria, unspecified: Secondary | ICD-10-CM | POA: Diagnosis not present

## 2016-07-30 DIAGNOSIS — E559 Vitamin D deficiency, unspecified: Secondary | ICD-10-CM | POA: Diagnosis not present

## 2016-07-31 ENCOUNTER — Ambulatory Visit: Payer: Self-pay

## 2016-08-05 ENCOUNTER — Ambulatory Visit (INDEPENDENT_AMBULATORY_CARE_PROVIDER_SITE_OTHER): Payer: Medicare Other

## 2016-08-05 DIAGNOSIS — J4520 Mild intermittent asthma, uncomplicated: Secondary | ICD-10-CM

## 2016-08-06 MED ORDER — OMALIZUMAB 150 MG ~~LOC~~ SOLR
300.0000 mg | SUBCUTANEOUS | Status: DC
  Administered 2016-08-05: 300 mg via SUBCUTANEOUS

## 2016-08-12 DIAGNOSIS — M1711 Unilateral primary osteoarthritis, right knee: Secondary | ICD-10-CM | POA: Diagnosis not present

## 2016-08-19 ENCOUNTER — Ambulatory Visit: Payer: Self-pay | Admitting: Pulmonary Disease

## 2016-08-19 ENCOUNTER — Ambulatory Visit: Payer: Self-pay

## 2016-08-21 ENCOUNTER — Ambulatory Visit: Payer: Self-pay

## 2016-08-25 ENCOUNTER — Ambulatory Visit (INDEPENDENT_AMBULATORY_CARE_PROVIDER_SITE_OTHER): Payer: Medicare Other

## 2016-08-25 DIAGNOSIS — R296 Repeated falls: Secondary | ICD-10-CM | POA: Diagnosis not present

## 2016-08-25 DIAGNOSIS — G301 Alzheimer's disease with late onset: Secondary | ICD-10-CM | POA: Diagnosis not present

## 2016-08-25 DIAGNOSIS — R2681 Unsteadiness on feet: Secondary | ICD-10-CM | POA: Diagnosis not present

## 2016-08-25 DIAGNOSIS — M6389 Disorders of muscle in diseases classified elsewhere, multiple sites: Secondary | ICD-10-CM | POA: Diagnosis not present

## 2016-08-25 DIAGNOSIS — M25361 Other instability, right knee: Secondary | ICD-10-CM | POA: Diagnosis not present

## 2016-08-25 DIAGNOSIS — M159 Polyosteoarthritis, unspecified: Secondary | ICD-10-CM | POA: Diagnosis not present

## 2016-08-25 DIAGNOSIS — R278 Other lack of coordination: Secondary | ICD-10-CM | POA: Diagnosis not present

## 2016-08-25 DIAGNOSIS — Z9181 History of falling: Secondary | ICD-10-CM | POA: Diagnosis not present

## 2016-08-25 DIAGNOSIS — M1711 Unilateral primary osteoarthritis, right knee: Secondary | ICD-10-CM | POA: Diagnosis not present

## 2016-08-25 DIAGNOSIS — J4520 Mild intermittent asthma, uncomplicated: Secondary | ICD-10-CM | POA: Diagnosis not present

## 2016-08-25 DIAGNOSIS — R2689 Other abnormalities of gait and mobility: Secondary | ICD-10-CM | POA: Diagnosis not present

## 2016-08-25 DIAGNOSIS — M25561 Pain in right knee: Secondary | ICD-10-CM | POA: Diagnosis not present

## 2016-08-25 MED ORDER — OMALIZUMAB 150 MG ~~LOC~~ SOLR
300.0000 mg | SUBCUTANEOUS | Status: DC
  Administered 2016-08-25: 300 mg via SUBCUTANEOUS

## 2016-08-25 NOTE — Progress Notes (Signed)
Documentation and charges have been completed by Desmond Dike, CMA based on the [facility] documentation sheet completed by [facility].

## 2016-08-26 DIAGNOSIS — R2689 Other abnormalities of gait and mobility: Secondary | ICD-10-CM | POA: Diagnosis not present

## 2016-08-26 DIAGNOSIS — M159 Polyosteoarthritis, unspecified: Secondary | ICD-10-CM | POA: Diagnosis not present

## 2016-08-26 DIAGNOSIS — R278 Other lack of coordination: Secondary | ICD-10-CM | POA: Diagnosis not present

## 2016-08-26 DIAGNOSIS — M6389 Disorders of muscle in diseases classified elsewhere, multiple sites: Secondary | ICD-10-CM | POA: Diagnosis not present

## 2016-08-26 DIAGNOSIS — G301 Alzheimer's disease with late onset: Secondary | ICD-10-CM | POA: Diagnosis not present

## 2016-08-26 DIAGNOSIS — M25561 Pain in right knee: Secondary | ICD-10-CM | POA: Diagnosis not present

## 2016-08-27 DIAGNOSIS — M6389 Disorders of muscle in diseases classified elsewhere, multiple sites: Secondary | ICD-10-CM | POA: Diagnosis not present

## 2016-08-27 DIAGNOSIS — M1711 Unilateral primary osteoarthritis, right knee: Secondary | ICD-10-CM | POA: Diagnosis not present

## 2016-08-27 DIAGNOSIS — M25561 Pain in right knee: Secondary | ICD-10-CM | POA: Diagnosis not present

## 2016-08-27 DIAGNOSIS — M159 Polyosteoarthritis, unspecified: Secondary | ICD-10-CM | POA: Diagnosis not present

## 2016-08-27 DIAGNOSIS — R278 Other lack of coordination: Secondary | ICD-10-CM | POA: Diagnosis not present

## 2016-08-27 DIAGNOSIS — R2689 Other abnormalities of gait and mobility: Secondary | ICD-10-CM | POA: Diagnosis not present

## 2016-08-27 DIAGNOSIS — G301 Alzheimer's disease with late onset: Secondary | ICD-10-CM | POA: Diagnosis not present

## 2016-08-28 DIAGNOSIS — R2689 Other abnormalities of gait and mobility: Secondary | ICD-10-CM | POA: Diagnosis not present

## 2016-08-28 DIAGNOSIS — G301 Alzheimer's disease with late onset: Secondary | ICD-10-CM | POA: Diagnosis not present

## 2016-08-28 DIAGNOSIS — M25561 Pain in right knee: Secondary | ICD-10-CM | POA: Diagnosis not present

## 2016-08-28 DIAGNOSIS — M159 Polyosteoarthritis, unspecified: Secondary | ICD-10-CM | POA: Diagnosis not present

## 2016-08-28 DIAGNOSIS — R278 Other lack of coordination: Secondary | ICD-10-CM | POA: Diagnosis not present

## 2016-08-28 DIAGNOSIS — M6389 Disorders of muscle in diseases classified elsewhere, multiple sites: Secondary | ICD-10-CM | POA: Diagnosis not present

## 2016-08-31 DIAGNOSIS — R32 Unspecified urinary incontinence: Secondary | ICD-10-CM | POA: Diagnosis not present

## 2016-08-31 DIAGNOSIS — G301 Alzheimer's disease with late onset: Secondary | ICD-10-CM | POA: Diagnosis not present

## 2016-08-31 DIAGNOSIS — M6389 Disorders of muscle in diseases classified elsewhere, multiple sites: Secondary | ICD-10-CM | POA: Diagnosis not present

## 2016-08-31 DIAGNOSIS — R278 Other lack of coordination: Secondary | ICD-10-CM | POA: Diagnosis not present

## 2016-08-31 DIAGNOSIS — Z6821 Body mass index (BMI) 21.0-21.9, adult: Secondary | ICD-10-CM | POA: Diagnosis not present

## 2016-08-31 DIAGNOSIS — I10 Essential (primary) hypertension: Secondary | ICD-10-CM | POA: Diagnosis not present

## 2016-08-31 DIAGNOSIS — E871 Hypo-osmolality and hyponatremia: Secondary | ICD-10-CM | POA: Diagnosis not present

## 2016-08-31 DIAGNOSIS — E559 Vitamin D deficiency, unspecified: Secondary | ICD-10-CM | POA: Diagnosis not present

## 2016-08-31 DIAGNOSIS — R413 Other amnesia: Secondary | ICD-10-CM | POA: Diagnosis not present

## 2016-08-31 DIAGNOSIS — R159 Full incontinence of feces: Secondary | ICD-10-CM | POA: Diagnosis not present

## 2016-08-31 DIAGNOSIS — R2689 Other abnormalities of gait and mobility: Secondary | ICD-10-CM | POA: Diagnosis not present

## 2016-08-31 DIAGNOSIS — M1990 Unspecified osteoarthritis, unspecified site: Secondary | ICD-10-CM | POA: Diagnosis not present

## 2016-08-31 DIAGNOSIS — N183 Chronic kidney disease, stage 3 (moderate): Secondary | ICD-10-CM | POA: Diagnosis not present

## 2016-08-31 DIAGNOSIS — I129 Hypertensive chronic kidney disease with stage 1 through stage 4 chronic kidney disease, or unspecified chronic kidney disease: Secondary | ICD-10-CM | POA: Diagnosis not present

## 2016-08-31 DIAGNOSIS — M159 Polyosteoarthritis, unspecified: Secondary | ICD-10-CM | POA: Diagnosis not present

## 2016-08-31 DIAGNOSIS — E538 Deficiency of other specified B group vitamins: Secondary | ICD-10-CM | POA: Diagnosis not present

## 2016-08-31 DIAGNOSIS — M25561 Pain in right knee: Secondary | ICD-10-CM | POA: Diagnosis not present

## 2016-09-01 DIAGNOSIS — R278 Other lack of coordination: Secondary | ICD-10-CM | POA: Diagnosis not present

## 2016-09-01 DIAGNOSIS — M6389 Disorders of muscle in diseases classified elsewhere, multiple sites: Secondary | ICD-10-CM | POA: Diagnosis not present

## 2016-09-01 DIAGNOSIS — M25561 Pain in right knee: Secondary | ICD-10-CM | POA: Diagnosis not present

## 2016-09-01 DIAGNOSIS — M159 Polyosteoarthritis, unspecified: Secondary | ICD-10-CM | POA: Diagnosis not present

## 2016-09-01 DIAGNOSIS — G301 Alzheimer's disease with late onset: Secondary | ICD-10-CM | POA: Diagnosis not present

## 2016-09-01 DIAGNOSIS — R2689 Other abnormalities of gait and mobility: Secondary | ICD-10-CM | POA: Diagnosis not present

## 2016-09-02 ENCOUNTER — Telehealth: Payer: Self-pay | Admitting: Pulmonary Disease

## 2016-09-02 DIAGNOSIS — G301 Alzheimer's disease with late onset: Secondary | ICD-10-CM | POA: Diagnosis not present

## 2016-09-02 DIAGNOSIS — M159 Polyosteoarthritis, unspecified: Secondary | ICD-10-CM | POA: Diagnosis not present

## 2016-09-02 DIAGNOSIS — R278 Other lack of coordination: Secondary | ICD-10-CM | POA: Diagnosis not present

## 2016-09-02 DIAGNOSIS — M25561 Pain in right knee: Secondary | ICD-10-CM | POA: Diagnosis not present

## 2016-09-02 DIAGNOSIS — R2689 Other abnormalities of gait and mobility: Secondary | ICD-10-CM | POA: Diagnosis not present

## 2016-09-02 DIAGNOSIS — M6389 Disorders of muscle in diseases classified elsewhere, multiple sites: Secondary | ICD-10-CM | POA: Diagnosis not present

## 2016-09-02 NOTE — Telephone Encounter (Signed)
#   vials: 4 Ordered date:09/02/16 Shipping Date:09/02/16

## 2016-09-03 DIAGNOSIS — G301 Alzheimer's disease with late onset: Secondary | ICD-10-CM | POA: Diagnosis not present

## 2016-09-03 DIAGNOSIS — M25561 Pain in right knee: Secondary | ICD-10-CM | POA: Diagnosis not present

## 2016-09-03 DIAGNOSIS — R2681 Unsteadiness on feet: Secondary | ICD-10-CM | POA: Diagnosis not present

## 2016-09-03 DIAGNOSIS — Z9181 History of falling: Secondary | ICD-10-CM | POA: Diagnosis not present

## 2016-09-03 DIAGNOSIS — M6389 Disorders of muscle in diseases classified elsewhere, multiple sites: Secondary | ICD-10-CM | POA: Diagnosis not present

## 2016-09-03 DIAGNOSIS — R2689 Other abnormalities of gait and mobility: Secondary | ICD-10-CM | POA: Diagnosis not present

## 2016-09-03 DIAGNOSIS — R278 Other lack of coordination: Secondary | ICD-10-CM | POA: Diagnosis not present

## 2016-09-03 DIAGNOSIS — M159 Polyosteoarthritis, unspecified: Secondary | ICD-10-CM | POA: Diagnosis not present

## 2016-09-03 DIAGNOSIS — M25361 Other instability, right knee: Secondary | ICD-10-CM | POA: Diagnosis not present

## 2016-09-03 DIAGNOSIS — R296 Repeated falls: Secondary | ICD-10-CM | POA: Diagnosis not present

## 2016-09-03 DIAGNOSIS — M1711 Unilateral primary osteoarthritis, right knee: Secondary | ICD-10-CM | POA: Diagnosis not present

## 2016-09-03 NOTE — Telephone Encounter (Signed)
#   Vials:4 Arrival Date:09/03/16 Lot #:[id number] Exp Date:8/21

## 2016-09-04 DIAGNOSIS — R278 Other lack of coordination: Secondary | ICD-10-CM | POA: Diagnosis not present

## 2016-09-04 DIAGNOSIS — M25561 Pain in right knee: Secondary | ICD-10-CM | POA: Diagnosis not present

## 2016-09-04 DIAGNOSIS — M159 Polyosteoarthritis, unspecified: Secondary | ICD-10-CM | POA: Diagnosis not present

## 2016-09-04 DIAGNOSIS — R2689 Other abnormalities of gait and mobility: Secondary | ICD-10-CM | POA: Diagnosis not present

## 2016-09-04 DIAGNOSIS — G301 Alzheimer's disease with late onset: Secondary | ICD-10-CM | POA: Diagnosis not present

## 2016-09-04 DIAGNOSIS — M6389 Disorders of muscle in diseases classified elsewhere, multiple sites: Secondary | ICD-10-CM | POA: Diagnosis not present

## 2016-09-07 DIAGNOSIS — M6389 Disorders of muscle in diseases classified elsewhere, multiple sites: Secondary | ICD-10-CM | POA: Diagnosis not present

## 2016-09-07 DIAGNOSIS — R2689 Other abnormalities of gait and mobility: Secondary | ICD-10-CM | POA: Diagnosis not present

## 2016-09-07 DIAGNOSIS — G301 Alzheimer's disease with late onset: Secondary | ICD-10-CM | POA: Diagnosis not present

## 2016-09-07 DIAGNOSIS — M159 Polyosteoarthritis, unspecified: Secondary | ICD-10-CM | POA: Diagnosis not present

## 2016-09-07 DIAGNOSIS — M25561 Pain in right knee: Secondary | ICD-10-CM | POA: Diagnosis not present

## 2016-09-07 DIAGNOSIS — R278 Other lack of coordination: Secondary | ICD-10-CM | POA: Diagnosis not present

## 2016-09-08 ENCOUNTER — Encounter: Payer: Self-pay | Admitting: Neurology

## 2016-09-08 ENCOUNTER — Ambulatory Visit (INDEPENDENT_AMBULATORY_CARE_PROVIDER_SITE_OTHER): Payer: Medicare Other | Admitting: Neurology

## 2016-09-08 VITALS — BP 154/66 | HR 72 | Ht 63.5 in | Wt 111.4 lb

## 2016-09-08 DIAGNOSIS — F0390 Unspecified dementia without behavioral disturbance: Secondary | ICD-10-CM | POA: Diagnosis not present

## 2016-09-08 MED ORDER — MEMANTINE HCL 10 MG PO TABS: 10.0000 mg | ORAL_TABLET | Freq: Two times a day (BID) | ORAL | 12 refills | Status: DC

## 2016-09-08 NOTE — Patient Instructions (Signed)
Remember to drink plenty of fluid, eat healthy meals and do not skip any meals. Try to eat protein with a every meal and eat a healthy snack such as fruit or nuts in between meals. Try to keep a regular sleep-wake schedule and try to exercise daily, particularly in the form of walking, 20-30 minutes a day, if you can.   As far as your medications are concerned, I would like to suggest; Increase Namenda to 10mg  twice day  I would like to see you back in 6-9 months, sooner if we need to. Please call us with any interim questions, concerns, problems, updates or refill requests.   Our phone number is [phone]. We also have an after hours call service for urgent matters and there is a physician on-call for urgent questions. For any emergencies you know to call 911 or go to the nearest emergency room

## 2016-09-08 NOTE — Progress Notes (Signed)
[facility]    Provider:  Dr [name] Referring Provider: [name], MD Primary Care Physician:  [name], MD  CC: Memory problems  Interval history 09/08/2016: Here for follow up of dementia She is on namenda. Did not tolerate AChesterase meds.Will continue namenda. Her right knee is giving out causing falls but she is in assisted living now with in-home care. Will increase Namenda to 10mg  twice daily. She is in assisted living. Here with her daughter. She gets confused and forgets more. Here with her daughter Dwyer. She gets a little more confused. She still asks if she has another apartment she is going back to, sometimes she thinks things are going on that are not such as she said they rolled her into an empty house and remembering things that did not happen. She is not remembering as well as she did. She is administered medicine by caretakers. Sometimes she turns the wrong way to go to the dining hall and gets confused. She did say that her husband visited her one night but at the time she had a virus. Otherwise no hallucinations or delusions or behavioral issues. Prozac is working well. She has been in assisted living since last fall. She is having shots in the knees. She is in PT.   Interval history 03/04/2016: Memory is worsening. She did not tolerate Aricept or Exelon patch due to GI effects, made her sick. Her B12 and TSH were normal. Had a long discussion about dementia, this is a progressive memory disorder. Other medications we can try include Namenda, will start today, discussed side effects. Daughter here and provides most information.   Reviewed images and findings with patient and daughter:C/w dementia. Clinically appears to be Alzheimer's,  may be a vascular component as well  This MRI of the brain without contrast shows the following: 1. Generalized cortical atrophy that has progressed since 2012. The atrophy is most pronounced in the  mesial temporal lobes. 2. Widespread T2/FLAIR hyperintense foci in both hemispheres, pons and cerebellum consistent with chronic microvascular ischemic changes. Many of these changes in the hemispheres and the cerebellum were present in 2012. 3. Although the signal changes in the pons most likely represent chronic microvascular ischemic foci, superimposed metabolic issues cannot be ruled out. 4. Chronic inflammation of the right frontal, right ethmoid cells, right hemi-sphenoid and right maxillary sinusitis with superimposed acute right maxillary sinusitis.  ZWC:HENI M Hornadayis a very pleasant 83 y.o.femalehere as a referral from Dr. Lenor Derrick memory loss. PMHx of Asthma, gerd, depression, cataracts. Started when she had a hip replacement after anesthesia 3-4 years ago. That's when daughter, who provides most information, first noticed it. It was mild at the time, this past fall started progressively worsening. The last 2 months it has gotten a lot worse. She gets confused more. She had a physical therapy appointment and she was confused when she had a new therapist she didn't know. She gets confused about appointments or whether daughter is working or not. Patient gets confused about where daughter is even though it is a work day and she should know where daughter is(at work). She lives in [place] alone in an apartment. She misses medications occasionally, daughter manages the finances, she does not drive because she felt her reaction time was less, she misplaces things and can't find them occasionally, patient is independent with most ADLs and IADLs. She eats in the [employer]. She has depression and it well controlled, daughter doesn't notice mood problems, she is  not as outgoing as she was. No hallucinations or delusions. She is more fatigued.   Reviewed notes, labs and imaging from outside physicians, which showed:  Ct of the head 01/2011: personally reviewed images  and agree with the following:  CT HEAD WITHOUT CONTRAST  Soft tissue windows demonstrate expected cerebral atrophy. Mild low density in the periventricular white matter likely related to small vessel disease. Slightly asymmetric, greater left than right. No mass lesion, hemorrhage, hydrocephalus, acute infarct, intra-axial, or extra-axial fluid collection.  IMPRESSION:personally reviewed and agree with the following:  1. No acute intracranial abnormality. 2. Cerebral atrophy and small vessel ischemic change. 3. Soft tissue swelling about the left supraorbital region.  Review of Systems: Patient complains of symptoms per HPI as well as the following symptoms: cough, wheezing, easy bruising, hering loss, incontinence, memory loss, confusion, decreased energy. Pertinent negatives per HPI. All others negative.  Social History   Social History  . Marital status: Married    Spouse name: N/A  . Number of children: 1  . Years of education: 43   Occupational History  . retired Retired   Social History Main Topics  . Smoking status: Never Smoker  . Smokeless tobacco: Never Used  . Alcohol use 0.0 oz/week     Comment: 1 glass daily  . Drug use: No  . Sexual activity: Not on file   Other Topics Concern  . Not on file   Social History Narrative   Originally from [place]. She previously lived in [place]. 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 No bird exposure. No recent mold exposure. Previous hold did have mold.       Lives at [facility] community: assistive living   Caffeine use: 2 cups coffee /day    Family History  Problem Relation Age of Onset  . Asthma    . Colon cancer Father   . Dementia Neg Hx     Past Medical History:  Diagnosis Date  . Arthritis   . Asthma   . Depression   . Fall   . Hyperlipidemia   . Hypertension   . Memory loss     Past Surgical History:  Procedure Laterality Date  . HIP ARTHROPLASTY  06/25/2011   Procedure:  ARTHROPLASTY BIPOLAR HIP;  Surgeon: Laurice Record Aplington;  Location: [facility];  Service: Orthopedics;  Laterality: Right;  Marland Kitchen VAGINAL HYSTERECTOMY      Current Outpatient Prescriptions  Medication Sig Dispense Refill  . acetaminophen (TYLENOL) 500 MG tablet Take 500 mg by mouth every 4 (four) hours as needed.      Marland Kitchen albuterol (PROVENTIL HFA;VENTOLIN HFA) 108 (90 Base) MCG/ACT inhaler Inhale into the lungs every 6 (six) hours as needed for wheezing or shortness of breath.    . Calcium Carbonate-Vitamin D3 (CALCIUM 600-D) 600-400 MG-UNIT TABS Take by mouth daily.    . Cholecalciferol (VITAMIN D-3) 1000 units CAPS Take by mouth daily.    . Cyanocobalamin (VITAMIN B-12) 3000 MCG SUBL Place under the tongue daily.    Marland Kitchen FLUoxetine (PROZAC) 20 MG capsule Take 40 mg by mouth daily.     . fluticasone (FLONASE) 50 MCG/ACT nasal spray instill 2 sprays into each nostril once daily 16 g 3  . fluticasone furoate-vilanterol (BREO ELLIPTA) 100-25 MCG/INH AEPB Inhale 1 puff into the lungs daily. 1 each 6  . loratadine (CLARITIN) 10 MG tablet Take 10 mg by mouth daily.    . memantine (NAMENDA) 5 MG tablet Take 1 tablet (5 mg total) by mouth  2 (two) times daily. 60 tablet 12  . montelukast (SINGULAIR) 10 MG tablet Take 1 tablet (10 mg total) by mouth daily. 60 tablet 5  . omeprazole (PRILOSEC) 20 MG capsule take 1 capsule by mouth once daily 30 MINUTES BEFORE A MEAL  0  . Polyethyl Glycol-Propyl Glycol (SYSTANE) 0.4-0.3 % SOLN Apply to eye as needed (dry eyes).     Current Facility-Administered Medications  Medication Dose Route Frequency Provider Last Rate Last Dose  . omalizumab Arvid Right) injection 300 mg  300 mg Subcutaneous Q14 Days Javier Glazier, MD   300 mg at 07/01/16 1223  . omalizumab Arvid Right) injection 300 mg  300 mg Subcutaneous Q14 Days Javier Glazier, MD   300 mg at 07/15/16 1121  . omalizumab Arvid Right) injection 300 mg  300 mg Subcutaneous Q14 Days Javier Glazier, MD   300 mg at 08/05/16 1633    . omalizumab Arvid Right) injection 300 mg  300 mg Subcutaneous Q14 Days Javier Glazier, MD   300 mg at 08/25/16 1522    Allergies as of 09/08/2016 - Review Complete 05/05/2016  Allergen Reaction Noted  . Azithromycin Other (See Comments) 04/17/2015  . Aspirin  06/25/2011  . Sulfonamide derivatives  05/13/2007    Vitals: Ht 5' 3.5" (1.613 m)   Wt 111 lb 6.4 oz (50.5 kg)   BMI 19.42 kg/m  Last Weight:  Wt Readings from Last 1 Encounters:  09/08/16 111 lb 6.4 oz (50.5 kg)   Last Height:   Ht Readings from Last 1 Encounters:  09/08/16 5' 3.5" (1.613 m)   [place] Cognitive Assessment  09/08/2016 03/04/2016 09/04/2015  Visuospatial/ Executive (0/5) 4 1 1   Naming (0/3) 3 3 2   Attention: Read list of digits (0/2) 2 2 2   Attention: Read list of letters (0/1) 1 1 1   Attention: Serial 7 subtraction starting at 100 (0/3) 1 1 3   Language: Repeat phrase (0/2) 2 2 2   Language : Fluency (0/1) 1 1 1   Abstraction (0/2) 2 1 1   Delayed Recall (0/5) 0 0 0  Orientation (0/6) 3 4 4   Total 19 16 17   Adjusted Score (based on education) 19 16 18     Cranial Nerves:    The pupils are equal, round, and reactive to light. The fundi are flat. Visual fields are full to finger confrontation. Extraocular movements are intact. Trigeminal sensation is intact and the muscles of mastication are normal. The face is symmetric. The palate elevates in the midline. Hearing intact. Voice is normal. Shoulder shrug is normal. The tongue has normal motion without fasciculations.   Coordination:    No dysmetria  Gait:    Cautious with walker  Motor Observation:    No asymmetry, no atrophy, and no involuntary movements noted. Tone:    Normal muscle tone.    Posture:    Posture is normal. normal erect    Strength:    Strength is V/V in the upper and lower limbs.      Sensation: intact to LT     Reflex Exam:  DTR's:    Absent AJs otherwise deep tendon reflexes in the upper and lower extremities are  normal bilaterally.   Toes:    The toes are equiv bilaterally.   Clonus:    Clonus is absent.         Assessment/Plan:very pleasant 83 y.o. female here as a referral from Dr. Osborne Casco for memory loss. PMHx of Asthma, gerd, depression, cataracts. Started when she had a hip replacement  after anesthesia 3-4 years ago. MoCA 19/30 today with worsening of memory per daughter c/w moderate to advanced cognitive impairment and dementia. She is in assisted living at [facility] since last fall.   As far as your medications are concerned, I would like to suggest:  I would like to suggest: Increase Namenda to 10mg  twice daily.   Discussed doing yoga, water aerobics. She used to particiapte, I encouraged starting again at [name]   Diagnostic testing: MRI of the brain and labs: reviewed today with patient and daughter at last appointment showing atrophy more pronounced in the mesial temporal lobes c/w alzheimer's dementia, showed them the images  Recommend calcium and vitamin D.  Daughter would like to try to add aricept back in in a month she will call. We can start 5mg  a day but stop for any side effects esp diarrhea as this can lead to dehydration  I would like to see you back in 6 months, sooner if we need to. Please call us with any interim questions, concerns, problems, updates or refill requests.   Addendum: Received notes from primary care, labs completed 02/26/2016. CMP with creatinine of 1, otherwise normal, CBC unremarkable with some mild anemia, vitamin D normal 40 6., B12 1928  Sarina Ill, MD  [facility] [address] [place], [place] [address]  Phone [phone] Fax [phone]  A total of 35 minutes was spent face-to-face with this patient. Over half this time was spent on counseling patient on the dementia diagnosis and different diagnostic and therapeutic options available.

## 2016-09-09 ENCOUNTER — Ambulatory Visit (INDEPENDENT_AMBULATORY_CARE_PROVIDER_SITE_OTHER): Payer: Medicare Other

## 2016-09-09 ENCOUNTER — Ambulatory Visit: Payer: Self-pay | Admitting: Pulmonary Disease

## 2016-09-09 ENCOUNTER — Telehealth: Payer: Self-pay | Admitting: Neurology

## 2016-09-09 DIAGNOSIS — M6389 Disorders of muscle in diseases classified elsewhere, multiple sites: Secondary | ICD-10-CM | POA: Diagnosis not present

## 2016-09-09 DIAGNOSIS — M159 Polyosteoarthritis, unspecified: Secondary | ICD-10-CM | POA: Diagnosis not present

## 2016-09-09 DIAGNOSIS — M25561 Pain in right knee: Secondary | ICD-10-CM | POA: Diagnosis not present

## 2016-09-09 DIAGNOSIS — R278 Other lack of coordination: Secondary | ICD-10-CM | POA: Diagnosis not present

## 2016-09-09 DIAGNOSIS — J4540 Moderate persistent asthma, uncomplicated: Secondary | ICD-10-CM | POA: Diagnosis not present

## 2016-09-09 DIAGNOSIS — R2689 Other abnormalities of gait and mobility: Secondary | ICD-10-CM | POA: Diagnosis not present

## 2016-09-09 DIAGNOSIS — G301 Alzheimer's disease with late onset: Secondary | ICD-10-CM | POA: Diagnosis not present

## 2016-09-09 NOTE — Telephone Encounter (Signed)
[name], would you please fax my last office note and a prescription for Namenda to her care facility please? Thank you

## 2016-09-10 DIAGNOSIS — M25561 Pain in right knee: Secondary | ICD-10-CM | POA: Diagnosis not present

## 2016-09-10 DIAGNOSIS — R278 Other lack of coordination: Secondary | ICD-10-CM | POA: Diagnosis not present

## 2016-09-10 DIAGNOSIS — M6389 Disorders of muscle in diseases classified elsewhere, multiple sites: Secondary | ICD-10-CM | POA: Diagnosis not present

## 2016-09-10 DIAGNOSIS — M159 Polyosteoarthritis, unspecified: Secondary | ICD-10-CM | POA: Diagnosis not present

## 2016-09-10 DIAGNOSIS — G301 Alzheimer's disease with late onset: Secondary | ICD-10-CM | POA: Diagnosis not present

## 2016-09-10 DIAGNOSIS — R2689 Other abnormalities of gait and mobility: Secondary | ICD-10-CM | POA: Diagnosis not present

## 2016-09-10 MED ORDER — MEMANTINE HCL 10 MG PO TABS: 10.0000 mg | ORAL_TABLET | Freq: Two times a day (BID) | ORAL | 11 refills | Status: AC

## 2016-09-10 NOTE — Telephone Encounter (Signed)
Notes and rx printed, awaiting MD review/signature.

## 2016-09-10 NOTE — Telephone Encounter (Signed)
Rx signed and faxed along w/ OV notes to [facility] # [phone].

## 2016-09-10 NOTE — Addendum Note (Signed)
Addended by: Monte Fantasia on: 09/10/2016 08:29 AM   Modules accepted: Orders

## 2016-09-11 ENCOUNTER — Encounter: Payer: Self-pay | Admitting: Pulmonary Disease

## 2016-09-11 ENCOUNTER — Ambulatory Visit (INDEPENDENT_AMBULATORY_CARE_PROVIDER_SITE_OTHER): Payer: Medicare Other | Admitting: Pulmonary Disease

## 2016-09-11 VITALS — BP 98/52 | HR 88 | Ht 63.5 in | Wt 117.4 lb

## 2016-09-11 DIAGNOSIS — G301 Alzheimer's disease with late onset: Secondary | ICD-10-CM | POA: Diagnosis not present

## 2016-09-11 DIAGNOSIS — J4540 Moderate persistent asthma, uncomplicated: Secondary | ICD-10-CM | POA: Diagnosis not present

## 2016-09-11 DIAGNOSIS — R278 Other lack of coordination: Secondary | ICD-10-CM | POA: Diagnosis not present

## 2016-09-11 DIAGNOSIS — R2689 Other abnormalities of gait and mobility: Secondary | ICD-10-CM | POA: Diagnosis not present

## 2016-09-11 DIAGNOSIS — M25561 Pain in right knee: Secondary | ICD-10-CM | POA: Diagnosis not present

## 2016-09-11 DIAGNOSIS — J309 Allergic rhinitis, unspecified: Secondary | ICD-10-CM

## 2016-09-11 DIAGNOSIS — M6389 Disorders of muscle in diseases classified elsewhere, multiple sites: Secondary | ICD-10-CM | POA: Diagnosis not present

## 2016-09-11 DIAGNOSIS — M159 Polyosteoarthritis, unspecified: Secondary | ICD-10-CM | POA: Diagnosis not present

## 2016-09-11 MED ORDER — FLUTICASONE PROPIONATE 50 MCG/ACT NA SUSP: NASAL | 6 refills | Status: DC

## 2016-09-11 MED ORDER — OMALIZUMAB 150 MG ~~LOC~~ SOLR
300.0000 mg | SUBCUTANEOUS | Status: DC
  Administered 2016-09-09: 300 mg via SUBCUTANEOUS

## 2016-09-11 MED ORDER — FLUTICASONE FUROATE-VILANTEROL 100-25 MCG/INH IN AEPB
1.0000 | INHALATION_SPRAY | Freq: Every day | RESPIRATORY_TRACT | 6 refills | Status: DC
Start: 2016-09-11 — End: 2017-09-14

## 2016-09-11 MED ORDER — MONTELUKAST SODIUM 10 MG PO TABS: 10.0000 mg | ORAL_TABLET | Freq: Every day | ORAL | 6 refills | Status: DC

## 2016-09-11 MED ORDER — ALBUTEROL SULFATE HFA 108 (90 BASE) MCG/ACT IN AERS: 1.0000 | INHALATION_SPRAY | Freq: Four times a day (QID) | RESPIRATORY_TRACT | 6 refills | Status: DC | PRN

## 2016-09-11 NOTE — Progress Notes (Signed)
Subjective:    Patient ID: [name], female    DOB: Sep 26, 1923, 83 y.o.   MRN: [id number]  C.C.:  Follow-up for Moderate, Persistent Asthma & Chronic Allergic Rhinitis.  HPI Moderate, Persistent Asthma:  Advair switched to Breo 100 at last appointment to improve adherence due to her forgetfulness. Continued on Singulair and Xolair. She denies any dyspnea. She is coughing intermittently and it is intermittently productive. No wheezing but patient's daughter reports she is wheezing occasionally. This has improved. She has no problems with the [facility]. No exacerbations since last appointment. She is using her rescue inhaler rarely.   Chronic Allergic Rhinitis:  Currently prescribed Flonase, Singulair, and Xolair. Also taking Zyrtec. She does have mild sinus drainage. No sinus congestion, pressure, or pain.   Review of Systems She reports no change in baseline fatigue. No syncope or near syncope. No chest pain or pressure. No fever or chills. No abdominal pain or nausea. She reports a good appetite. Her memory seems to be worsening.   Allergies  Allergen Reactions  . Azithromycin Other (See Comments)    Nausea, weakness   . Aspirin   . Sulfonamide Derivatives     Current Outpatient Prescriptions on File Prior to Visit  Medication Sig Dispense Refill  . acetaminophen (TYLENOL) 500 MG tablet Take 500 mg by mouth every 4 (four) hours as needed.      . Calcium Carbonate-Vitamin D3 (CALCIUM 600-D) 600-400 MG-UNIT TABS Take by mouth daily.    . Cholecalciferol (VITAMIN D-3) 1000 units CAPS Take by mouth daily.    . Cyanocobalamin (VITAMIN B-12) 3000 MCG SUBL Place under the tongue daily.    Marland Kitchen FLUoxetine (PROZAC) 20 MG capsule Take 40 mg by mouth daily.     Marland Kitchen loratadine (CLARITIN) 10 MG tablet Take 10 mg by mouth daily.    . memantine (NAMENDA) 10 MG tablet Take 1 tablet (10 mg total) by mouth 2 (two) times daily. 60 tablet 11  . omeprazole (PRILOSEC) 20 MG capsule take 1 capsule by mouth  once daily 30 MINUTES BEFORE A MEAL  0  . Polyethyl Glycol-Propyl Glycol (SYSTANE) 0.4-0.3 % SOLN Apply to eye as needed (dry eyes).    . [DISCONTINUED] rivaroxaban (XARELTO) 10 MG TABS tablet Take 1 tablet (10 mg total) by mouth daily. 12 tablet 0   Current Facility-Administered Medications on File Prior to Visit  Medication Dose Route Frequency Provider Last Rate Last Dose  . omalizumab Arvid Right) injection 300 mg  300 mg Subcutaneous Q14 Days Javier Glazier, MD   300 mg at 07/01/16 1223  . omalizumab Arvid Right) injection 300 mg  300 mg Subcutaneous Q14 Days Javier Glazier, MD   300 mg at 07/15/16 1121  . omalizumab Arvid Right) injection 300 mg  300 mg Subcutaneous Q14 Days Javier Glazier, MD   300 mg at 08/05/16 1633  . omalizumab Arvid Right) injection 300 mg  300 mg Subcutaneous Q14 Days Javier Glazier, MD   300 mg at 08/25/16 1522  . omalizumab Arvid Right) injection 300 mg  300 mg Subcutaneous Q14 Days Javier Glazier, MD   300 mg at 09/09/16 2297    Past Medical History:  Diagnosis Date  . Arthritis   . Asthma   . Depression   . Fall   . Hyperlipidemia   . Hypertension   . Memory loss     Past Surgical History:  Procedure Laterality Date  . HIP ARTHROPLASTY  06/25/2011   Procedure: ARTHROPLASTY BIPOLAR HIP;  Surgeon:  James P Aplington;  Location: [facility];  Service: Orthopedics;  Laterality: Right;  Marland Kitchen VAGINAL HYSTERECTOMY      Family History  Problem Relation Age of Onset  . Asthma    . Colon cancer Father   . Dementia Neg Hx     Social History   Social History  . Marital status: Married    Spouse name: N/A  . Number of children: 1  . Years of education: 57   Occupational History  . retired Retired   Social History Main Topics  . Smoking status: Never Smoker  . Smokeless tobacco: Never Used  . Alcohol use 0.0 oz/week     Comment: 1 glass daily  . Drug use: No  . Sexual activity: Not Asked   Other Topics Concern  . None   Social History Narrative    Originally from [place]. She previously lived in [place]. 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 No bird exposure. No recent mold exposure. Previous hold did have mold.       Lives at [facility] retirement community: assistive living   Caffeine use: 2 cups coffee /day      Objective:   Physical Exam BP (!) 98/52 (BP Location: Right Arm, Patient Position: Sitting, Cuff Size: Normal)   Pulse 88   Ht 5' 3.5" (1.613 m)   Wt 117 lb 6.4 oz (53.3 kg)   SpO2 98%   BMI 20.47 kg/m   General:  Awake. Alert. Elderly female. Daughter with her today.  Integument:  Warm & dry. No rash on exposed skin.  Extremities:  No cyanosis or clubbing.  HEENT:  Moist mucus membranes. No oral ulcers. No nasal turbinate swelling. Cardiovascular:  Regular rate & rhythm. No edema. Normal S1 & S2. Pulmonary:  Clear with auscultation. No accessory muscle use on room air. Speaking in complete sentences. Abdomen: Soft. Normal bowel sounds. Nontender. Musculoskeletal:  Normal bulk and tone. No effusion appreciated.  PFT 03/29/06: FVC 2.20 L (93%) FEV1 1.45 L (91%) FEV1/FVC 0.66 FEF 25-75 0.74 L (40%) negative bronchodilator response TLC 3.80 L (88%) RV 84% ERV 42% DLCO uncorrected 83%  LABS 10/10/8 IgE:  490.7    Assessment & Plan:  83 y.o. female with moderate, persistent asthma and chronic allergic rhinitis. Overall her asthma and rhinitis seems to be very well controlled. Since moving into an assisted living facility she has had improved adherence to her inhaler regimen. With her excellent symptom control I see no need to adjust her inhaler or medication regimen.   1. Moderate, Persistent Asthma:  Continuing patient on Xolair, Breo, & Singulair. Continuing to use Albuterol inhaler as needed. No changes to her regimen.  2. Chronic Allergic Rhinitis:  Continuing patient on Flonase, Zyrtec, Singulair, & Xolair. No changes at this time. 3. Health Maintenance: S/P Influenza Vaccine September 2017, Prevnar August 2015 &  Pneumovax November 2010.  4. Follow-up: Patient to return to clinic in 6 months or sooner if needed.   Sonia Baller Ashok Cordia, M.D. [facility] Pager:  [phone] After 3pm or if no response, call [phone] 1:33 PM 09/11/16

## 2016-09-11 NOTE — Patient Instructions (Signed)
   Continue using your inhalers and medications as prescribed.  Call me if you have any new breathing problems before your next appointment.  I will see you back in 6 months or sooner if needed.

## 2016-09-11 NOTE — Progress Notes (Signed)
Xolair injection documentation and charges entered by Maury Dus, RMA, based on injection sheet filled out by Alroy Bailiff.

## 2016-09-14 DIAGNOSIS — M159 Polyosteoarthritis, unspecified: Secondary | ICD-10-CM | POA: Diagnosis not present

## 2016-09-14 DIAGNOSIS — R278 Other lack of coordination: Secondary | ICD-10-CM | POA: Diagnosis not present

## 2016-09-14 DIAGNOSIS — R2689 Other abnormalities of gait and mobility: Secondary | ICD-10-CM | POA: Diagnosis not present

## 2016-09-14 DIAGNOSIS — G301 Alzheimer's disease with late onset: Secondary | ICD-10-CM | POA: Diagnosis not present

## 2016-09-14 DIAGNOSIS — M6389 Disorders of muscle in diseases classified elsewhere, multiple sites: Secondary | ICD-10-CM | POA: Diagnosis not present

## 2016-09-14 DIAGNOSIS — M25561 Pain in right knee: Secondary | ICD-10-CM | POA: Diagnosis not present

## 2016-09-15 DIAGNOSIS — R2689 Other abnormalities of gait and mobility: Secondary | ICD-10-CM | POA: Diagnosis not present

## 2016-09-15 DIAGNOSIS — R278 Other lack of coordination: Secondary | ICD-10-CM | POA: Diagnosis not present

## 2016-09-15 DIAGNOSIS — G301 Alzheimer's disease with late onset: Secondary | ICD-10-CM | POA: Diagnosis not present

## 2016-09-15 DIAGNOSIS — M25561 Pain in right knee: Secondary | ICD-10-CM | POA: Diagnosis not present

## 2016-09-15 DIAGNOSIS — M159 Polyosteoarthritis, unspecified: Secondary | ICD-10-CM | POA: Diagnosis not present

## 2016-09-15 DIAGNOSIS — M6389 Disorders of muscle in diseases classified elsewhere, multiple sites: Secondary | ICD-10-CM | POA: Diagnosis not present

## 2016-09-16 DIAGNOSIS — G301 Alzheimer's disease with late onset: Secondary | ICD-10-CM | POA: Diagnosis not present

## 2016-09-16 DIAGNOSIS — M159 Polyosteoarthritis, unspecified: Secondary | ICD-10-CM | POA: Diagnosis not present

## 2016-09-16 DIAGNOSIS — R2689 Other abnormalities of gait and mobility: Secondary | ICD-10-CM | POA: Diagnosis not present

## 2016-09-16 DIAGNOSIS — R278 Other lack of coordination: Secondary | ICD-10-CM | POA: Diagnosis not present

## 2016-09-16 DIAGNOSIS — M6389 Disorders of muscle in diseases classified elsewhere, multiple sites: Secondary | ICD-10-CM | POA: Diagnosis not present

## 2016-09-16 DIAGNOSIS — M25561 Pain in right knee: Secondary | ICD-10-CM | POA: Diagnosis not present

## 2016-09-17 DIAGNOSIS — M25561 Pain in right knee: Secondary | ICD-10-CM | POA: Diagnosis not present

## 2016-09-17 DIAGNOSIS — R278 Other lack of coordination: Secondary | ICD-10-CM | POA: Diagnosis not present

## 2016-09-17 DIAGNOSIS — R2689 Other abnormalities of gait and mobility: Secondary | ICD-10-CM | POA: Diagnosis not present

## 2016-09-17 DIAGNOSIS — G301 Alzheimer's disease with late onset: Secondary | ICD-10-CM | POA: Diagnosis not present

## 2016-09-17 DIAGNOSIS — M6389 Disorders of muscle in diseases classified elsewhere, multiple sites: Secondary | ICD-10-CM | POA: Diagnosis not present

## 2016-09-17 DIAGNOSIS — M159 Polyosteoarthritis, unspecified: Secondary | ICD-10-CM | POA: Diagnosis not present

## 2016-09-18 DIAGNOSIS — R2689 Other abnormalities of gait and mobility: Secondary | ICD-10-CM | POA: Diagnosis not present

## 2016-09-18 DIAGNOSIS — G301 Alzheimer's disease with late onset: Secondary | ICD-10-CM | POA: Diagnosis not present

## 2016-09-18 DIAGNOSIS — M25561 Pain in right knee: Secondary | ICD-10-CM | POA: Diagnosis not present

## 2016-09-18 DIAGNOSIS — R278 Other lack of coordination: Secondary | ICD-10-CM | POA: Diagnosis not present

## 2016-09-18 DIAGNOSIS — M159 Polyosteoarthritis, unspecified: Secondary | ICD-10-CM | POA: Diagnosis not present

## 2016-09-18 DIAGNOSIS — M6389 Disorders of muscle in diseases classified elsewhere, multiple sites: Secondary | ICD-10-CM | POA: Diagnosis not present

## 2016-09-21 DIAGNOSIS — R278 Other lack of coordination: Secondary | ICD-10-CM | POA: Diagnosis not present

## 2016-09-21 DIAGNOSIS — R2689 Other abnormalities of gait and mobility: Secondary | ICD-10-CM | POA: Diagnosis not present

## 2016-09-21 DIAGNOSIS — M25561 Pain in right knee: Secondary | ICD-10-CM | POA: Diagnosis not present

## 2016-09-21 DIAGNOSIS — G301 Alzheimer's disease with late onset: Secondary | ICD-10-CM | POA: Diagnosis not present

## 2016-09-21 DIAGNOSIS — M6389 Disorders of muscle in diseases classified elsewhere, multiple sites: Secondary | ICD-10-CM | POA: Diagnosis not present

## 2016-09-21 DIAGNOSIS — M159 Polyosteoarthritis, unspecified: Secondary | ICD-10-CM | POA: Diagnosis not present

## 2016-09-22 DIAGNOSIS — G301 Alzheimer's disease with late onset: Secondary | ICD-10-CM | POA: Diagnosis not present

## 2016-09-22 DIAGNOSIS — R2689 Other abnormalities of gait and mobility: Secondary | ICD-10-CM | POA: Diagnosis not present

## 2016-09-22 DIAGNOSIS — R278 Other lack of coordination: Secondary | ICD-10-CM | POA: Diagnosis not present

## 2016-09-22 DIAGNOSIS — M159 Polyosteoarthritis, unspecified: Secondary | ICD-10-CM | POA: Diagnosis not present

## 2016-09-22 DIAGNOSIS — M25561 Pain in right knee: Secondary | ICD-10-CM | POA: Diagnosis not present

## 2016-09-22 DIAGNOSIS — M6389 Disorders of muscle in diseases classified elsewhere, multiple sites: Secondary | ICD-10-CM | POA: Diagnosis not present

## 2016-09-23 ENCOUNTER — Ambulatory Visit (INDEPENDENT_AMBULATORY_CARE_PROVIDER_SITE_OTHER): Payer: Medicare Other

## 2016-09-23 DIAGNOSIS — R278 Other lack of coordination: Secondary | ICD-10-CM | POA: Diagnosis not present

## 2016-09-23 DIAGNOSIS — J4520 Mild intermittent asthma, uncomplicated: Secondary | ICD-10-CM

## 2016-09-23 DIAGNOSIS — M6389 Disorders of muscle in diseases classified elsewhere, multiple sites: Secondary | ICD-10-CM | POA: Diagnosis not present

## 2016-09-23 DIAGNOSIS — M159 Polyosteoarthritis, unspecified: Secondary | ICD-10-CM | POA: Diagnosis not present

## 2016-09-23 DIAGNOSIS — R2689 Other abnormalities of gait and mobility: Secondary | ICD-10-CM | POA: Diagnosis not present

## 2016-09-23 DIAGNOSIS — G301 Alzheimer's disease with late onset: Secondary | ICD-10-CM | POA: Diagnosis not present

## 2016-09-23 DIAGNOSIS — M25561 Pain in right knee: Secondary | ICD-10-CM | POA: Diagnosis not present

## 2016-09-24 DIAGNOSIS — R2689 Other abnormalities of gait and mobility: Secondary | ICD-10-CM | POA: Diagnosis not present

## 2016-09-24 DIAGNOSIS — G301 Alzheimer's disease with late onset: Secondary | ICD-10-CM | POA: Diagnosis not present

## 2016-09-24 DIAGNOSIS — M6389 Disorders of muscle in diseases classified elsewhere, multiple sites: Secondary | ICD-10-CM | POA: Diagnosis not present

## 2016-09-24 DIAGNOSIS — M159 Polyosteoarthritis, unspecified: Secondary | ICD-10-CM | POA: Diagnosis not present

## 2016-09-24 DIAGNOSIS — R278 Other lack of coordination: Secondary | ICD-10-CM | POA: Diagnosis not present

## 2016-09-24 DIAGNOSIS — M25561 Pain in right knee: Secondary | ICD-10-CM | POA: Diagnosis not present

## 2016-09-24 MED ORDER — OMALIZUMAB 150 MG ~~LOC~~ SOLR
300.0000 mg | SUBCUTANEOUS | Status: DC
  Administered 2016-09-23: 300 mg via SUBCUTANEOUS

## 2016-09-24 NOTE — Progress Notes (Signed)
Documentation of medication administration of Xolair and charges have been completed by Lindsay Lemons, CMA based on the Xolair documentation sheet completed by Tammy Scott.  

## 2016-09-25 DIAGNOSIS — R278 Other lack of coordination: Secondary | ICD-10-CM | POA: Diagnosis not present

## 2016-09-25 DIAGNOSIS — G301 Alzheimer's disease with late onset: Secondary | ICD-10-CM | POA: Diagnosis not present

## 2016-09-25 DIAGNOSIS — M25561 Pain in right knee: Secondary | ICD-10-CM | POA: Diagnosis not present

## 2016-09-25 DIAGNOSIS — R2689 Other abnormalities of gait and mobility: Secondary | ICD-10-CM | POA: Diagnosis not present

## 2016-09-25 DIAGNOSIS — M159 Polyosteoarthritis, unspecified: Secondary | ICD-10-CM | POA: Diagnosis not present

## 2016-09-25 DIAGNOSIS — M6389 Disorders of muscle in diseases classified elsewhere, multiple sites: Secondary | ICD-10-CM | POA: Diagnosis not present

## 2016-09-28 ENCOUNTER — Telehealth: Payer: Self-pay | Admitting: Pulmonary Disease

## 2016-09-28 DIAGNOSIS — M25561 Pain in right knee: Secondary | ICD-10-CM | POA: Diagnosis not present

## 2016-09-28 DIAGNOSIS — M159 Polyosteoarthritis, unspecified: Secondary | ICD-10-CM | POA: Diagnosis not present

## 2016-09-28 DIAGNOSIS — M6389 Disorders of muscle in diseases classified elsewhere, multiple sites: Secondary | ICD-10-CM | POA: Diagnosis not present

## 2016-09-28 DIAGNOSIS — G301 Alzheimer's disease with late onset: Secondary | ICD-10-CM | POA: Diagnosis not present

## 2016-09-28 DIAGNOSIS — R2689 Other abnormalities of gait and mobility: Secondary | ICD-10-CM | POA: Diagnosis not present

## 2016-09-28 DIAGNOSIS — R278 Other lack of coordination: Secondary | ICD-10-CM | POA: Diagnosis not present

## 2016-09-28 NOTE — Telephone Encounter (Signed)
#   vials:4 Ordered date:09/28/16 Shipping Date:09/28/16

## 2016-09-29 NOTE — Telephone Encounter (Signed)
#   Vials:4 Arrival Date:09/29/16 Lot #:[id number] Exp Date:9/21

## 2016-10-02 DIAGNOSIS — R296 Repeated falls: Secondary | ICD-10-CM | POA: Diagnosis not present

## 2016-10-02 DIAGNOSIS — R278 Other lack of coordination: Secondary | ICD-10-CM | POA: Diagnosis not present

## 2016-10-02 DIAGNOSIS — M1711 Unilateral primary osteoarthritis, right knee: Secondary | ICD-10-CM | POA: Diagnosis not present

## 2016-10-02 DIAGNOSIS — M6389 Disorders of muscle in diseases classified elsewhere, multiple sites: Secondary | ICD-10-CM | POA: Diagnosis not present

## 2016-10-02 DIAGNOSIS — M25561 Pain in right knee: Secondary | ICD-10-CM | POA: Diagnosis not present

## 2016-10-02 DIAGNOSIS — M25361 Other instability, right knee: Secondary | ICD-10-CM | POA: Diagnosis not present

## 2016-10-05 DIAGNOSIS — M1711 Unilateral primary osteoarthritis, right knee: Secondary | ICD-10-CM | POA: Diagnosis not present

## 2016-10-05 DIAGNOSIS — M25361 Other instability, right knee: Secondary | ICD-10-CM | POA: Diagnosis not present

## 2016-10-05 DIAGNOSIS — M6389 Disorders of muscle in diseases classified elsewhere, multiple sites: Secondary | ICD-10-CM | POA: Diagnosis not present

## 2016-10-05 DIAGNOSIS — R278 Other lack of coordination: Secondary | ICD-10-CM | POA: Diagnosis not present

## 2016-10-05 DIAGNOSIS — M25561 Pain in right knee: Secondary | ICD-10-CM | POA: Diagnosis not present

## 2016-10-05 DIAGNOSIS — R296 Repeated falls: Secondary | ICD-10-CM | POA: Diagnosis not present

## 2016-10-06 DIAGNOSIS — M25561 Pain in right knee: Secondary | ICD-10-CM | POA: Diagnosis not present

## 2016-10-06 DIAGNOSIS — R278 Other lack of coordination: Secondary | ICD-10-CM | POA: Diagnosis not present

## 2016-10-06 DIAGNOSIS — M25361 Other instability, right knee: Secondary | ICD-10-CM | POA: Diagnosis not present

## 2016-10-06 DIAGNOSIS — M1711 Unilateral primary osteoarthritis, right knee: Secondary | ICD-10-CM | POA: Diagnosis not present

## 2016-10-06 DIAGNOSIS — R296 Repeated falls: Secondary | ICD-10-CM | POA: Diagnosis not present

## 2016-10-06 DIAGNOSIS — M6389 Disorders of muscle in diseases classified elsewhere, multiple sites: Secondary | ICD-10-CM | POA: Diagnosis not present

## 2016-10-07 ENCOUNTER — Ambulatory Visit (INDEPENDENT_AMBULATORY_CARE_PROVIDER_SITE_OTHER): Payer: Medicare Other

## 2016-10-07 DIAGNOSIS — J4540 Moderate persistent asthma, uncomplicated: Secondary | ICD-10-CM | POA: Diagnosis not present

## 2016-10-08 MED ORDER — OMALIZUMAB 150 MG ~~LOC~~ SOLR
300.0000 mg | Freq: Once | SUBCUTANEOUS | Status: AC
  Administered 2016-10-07: 300 mg via SUBCUTANEOUS

## 2016-10-09 DIAGNOSIS — R296 Repeated falls: Secondary | ICD-10-CM | POA: Diagnosis not present

## 2016-10-09 DIAGNOSIS — M25561 Pain in right knee: Secondary | ICD-10-CM | POA: Diagnosis not present

## 2016-10-09 DIAGNOSIS — M25361 Other instability, right knee: Secondary | ICD-10-CM | POA: Diagnosis not present

## 2016-10-09 DIAGNOSIS — M6389 Disorders of muscle in diseases classified elsewhere, multiple sites: Secondary | ICD-10-CM | POA: Diagnosis not present

## 2016-10-09 DIAGNOSIS — R278 Other lack of coordination: Secondary | ICD-10-CM | POA: Diagnosis not present

## 2016-10-09 DIAGNOSIS — M1711 Unilateral primary osteoarthritis, right knee: Secondary | ICD-10-CM | POA: Diagnosis not present

## 2016-10-12 DIAGNOSIS — M25361 Other instability, right knee: Secondary | ICD-10-CM | POA: Diagnosis not present

## 2016-10-12 DIAGNOSIS — R296 Repeated falls: Secondary | ICD-10-CM | POA: Diagnosis not present

## 2016-10-12 DIAGNOSIS — R278 Other lack of coordination: Secondary | ICD-10-CM | POA: Diagnosis not present

## 2016-10-12 DIAGNOSIS — M25561 Pain in right knee: Secondary | ICD-10-CM | POA: Diagnosis not present

## 2016-10-12 DIAGNOSIS — M6389 Disorders of muscle in diseases classified elsewhere, multiple sites: Secondary | ICD-10-CM | POA: Diagnosis not present

## 2016-10-12 DIAGNOSIS — M1711 Unilateral primary osteoarthritis, right knee: Secondary | ICD-10-CM | POA: Diagnosis not present

## 2016-10-20 DIAGNOSIS — M25561 Pain in right knee: Secondary | ICD-10-CM | POA: Diagnosis not present

## 2016-10-20 DIAGNOSIS — M1711 Unilateral primary osteoarthritis, right knee: Secondary | ICD-10-CM | POA: Diagnosis not present

## 2016-10-20 DIAGNOSIS — R278 Other lack of coordination: Secondary | ICD-10-CM | POA: Diagnosis not present

## 2016-10-20 DIAGNOSIS — M6389 Disorders of muscle in diseases classified elsewhere, multiple sites: Secondary | ICD-10-CM | POA: Diagnosis not present

## 2016-10-20 DIAGNOSIS — R296 Repeated falls: Secondary | ICD-10-CM | POA: Diagnosis not present

## 2016-10-20 DIAGNOSIS — M25361 Other instability, right knee: Secondary | ICD-10-CM | POA: Diagnosis not present

## 2016-10-21 ENCOUNTER — Ambulatory Visit: Payer: Self-pay

## 2016-10-22 DIAGNOSIS — R296 Repeated falls: Secondary | ICD-10-CM | POA: Diagnosis not present

## 2016-10-22 DIAGNOSIS — M25361 Other instability, right knee: Secondary | ICD-10-CM | POA: Diagnosis not present

## 2016-10-22 DIAGNOSIS — M25561 Pain in right knee: Secondary | ICD-10-CM | POA: Diagnosis not present

## 2016-10-22 DIAGNOSIS — R278 Other lack of coordination: Secondary | ICD-10-CM | POA: Diagnosis not present

## 2016-10-22 DIAGNOSIS — M6389 Disorders of muscle in diseases classified elsewhere, multiple sites: Secondary | ICD-10-CM | POA: Diagnosis not present

## 2016-10-22 DIAGNOSIS — M1711 Unilateral primary osteoarthritis, right knee: Secondary | ICD-10-CM | POA: Diagnosis not present

## 2016-10-26 ENCOUNTER — Ambulatory Visit (INDEPENDENT_AMBULATORY_CARE_PROVIDER_SITE_OTHER): Payer: Medicare Other

## 2016-10-26 DIAGNOSIS — J4540 Moderate persistent asthma, uncomplicated: Secondary | ICD-10-CM | POA: Diagnosis not present

## 2016-10-28 DIAGNOSIS — R296 Repeated falls: Secondary | ICD-10-CM | POA: Diagnosis not present

## 2016-10-28 DIAGNOSIS — M25361 Other instability, right knee: Secondary | ICD-10-CM | POA: Diagnosis not present

## 2016-10-28 DIAGNOSIS — M1711 Unilateral primary osteoarthritis, right knee: Secondary | ICD-10-CM | POA: Diagnosis not present

## 2016-10-28 DIAGNOSIS — M6389 Disorders of muscle in diseases classified elsewhere, multiple sites: Secondary | ICD-10-CM | POA: Diagnosis not present

## 2016-10-28 DIAGNOSIS — R278 Other lack of coordination: Secondary | ICD-10-CM | POA: Diagnosis not present

## 2016-10-28 DIAGNOSIS — M25561 Pain in right knee: Secondary | ICD-10-CM | POA: Diagnosis not present

## 2016-10-29 MED ORDER — OMALIZUMAB 150 MG ~~LOC~~ SOLR
300.0000 mg | Freq: Once | SUBCUTANEOUS | Status: AC
  Administered 2016-10-26: 300 mg via SUBCUTANEOUS

## 2016-11-04 ENCOUNTER — Telehealth: Payer: Self-pay | Admitting: Pulmonary Disease

## 2016-11-04 ENCOUNTER — Ambulatory Visit (INDEPENDENT_AMBULATORY_CARE_PROVIDER_SITE_OTHER): Payer: Medicare Other

## 2016-11-04 DIAGNOSIS — J4540 Moderate persistent asthma, uncomplicated: Secondary | ICD-10-CM | POA: Diagnosis not present

## 2016-11-04 NOTE — Telephone Encounter (Signed)
#   vials:4 Ordered date:11/04/16 Shipping Date:11/04/16

## 2016-11-05 MED ORDER — OMALIZUMAB 150 MG ~~LOC~~ SOLR
300.0000 mg | Freq: Once | SUBCUTANEOUS | Status: AC
  Administered 2016-11-04: 300 mg via SUBCUTANEOUS

## 2016-11-05 NOTE — Telephone Encounter (Signed)
#   Vials:4 Arrival Date:11/05/16 Lot #:[id number] Exp Date:9/21

## 2016-11-10 DIAGNOSIS — R278 Other lack of coordination: Secondary | ICD-10-CM | POA: Diagnosis not present

## 2016-11-10 DIAGNOSIS — M1711 Unilateral primary osteoarthritis, right knee: Secondary | ICD-10-CM | POA: Diagnosis not present

## 2016-11-10 DIAGNOSIS — M25561 Pain in right knee: Secondary | ICD-10-CM | POA: Diagnosis not present

## 2016-11-10 DIAGNOSIS — M25361 Other instability, right knee: Secondary | ICD-10-CM | POA: Diagnosis not present

## 2016-11-10 DIAGNOSIS — M6389 Disorders of muscle in diseases classified elsewhere, multiple sites: Secondary | ICD-10-CM | POA: Diagnosis not present

## 2016-11-10 DIAGNOSIS — R296 Repeated falls: Secondary | ICD-10-CM | POA: Diagnosis not present

## 2016-11-12 DIAGNOSIS — R278 Other lack of coordination: Secondary | ICD-10-CM | POA: Diagnosis not present

## 2016-11-12 DIAGNOSIS — M1711 Unilateral primary osteoarthritis, right knee: Secondary | ICD-10-CM | POA: Diagnosis not present

## 2016-11-12 DIAGNOSIS — M25561 Pain in right knee: Secondary | ICD-10-CM | POA: Diagnosis not present

## 2016-11-12 DIAGNOSIS — M6389 Disorders of muscle in diseases classified elsewhere, multiple sites: Secondary | ICD-10-CM | POA: Diagnosis not present

## 2016-11-12 DIAGNOSIS — M25361 Other instability, right knee: Secondary | ICD-10-CM | POA: Diagnosis not present

## 2016-11-12 DIAGNOSIS — R296 Repeated falls: Secondary | ICD-10-CM | POA: Diagnosis not present

## 2016-11-16 DIAGNOSIS — R296 Repeated falls: Secondary | ICD-10-CM | POA: Diagnosis not present

## 2016-11-16 DIAGNOSIS — M25361 Other instability, right knee: Secondary | ICD-10-CM | POA: Diagnosis not present

## 2016-11-16 DIAGNOSIS — R278 Other lack of coordination: Secondary | ICD-10-CM | POA: Diagnosis not present

## 2016-11-16 DIAGNOSIS — M6389 Disorders of muscle in diseases classified elsewhere, multiple sites: Secondary | ICD-10-CM | POA: Diagnosis not present

## 2016-11-16 DIAGNOSIS — M25561 Pain in right knee: Secondary | ICD-10-CM | POA: Diagnosis not present

## 2016-11-16 DIAGNOSIS — M1711 Unilateral primary osteoarthritis, right knee: Secondary | ICD-10-CM | POA: Diagnosis not present

## 2016-11-18 ENCOUNTER — Ambulatory Visit (INDEPENDENT_AMBULATORY_CARE_PROVIDER_SITE_OTHER): Payer: Medicare Other

## 2016-11-18 DIAGNOSIS — M25561 Pain in right knee: Secondary | ICD-10-CM | POA: Diagnosis not present

## 2016-11-18 DIAGNOSIS — M1711 Unilateral primary osteoarthritis, right knee: Secondary | ICD-10-CM | POA: Diagnosis not present

## 2016-11-18 DIAGNOSIS — J4540 Moderate persistent asthma, uncomplicated: Secondary | ICD-10-CM

## 2016-11-18 DIAGNOSIS — R296 Repeated falls: Secondary | ICD-10-CM | POA: Diagnosis not present

## 2016-11-18 DIAGNOSIS — R278 Other lack of coordination: Secondary | ICD-10-CM | POA: Diagnosis not present

## 2016-11-18 DIAGNOSIS — M25361 Other instability, right knee: Secondary | ICD-10-CM | POA: Diagnosis not present

## 2016-11-18 DIAGNOSIS — M6389 Disorders of muscle in diseases classified elsewhere, multiple sites: Secondary | ICD-10-CM | POA: Diagnosis not present

## 2016-11-20 MED ORDER — OMALIZUMAB 150 MG ~~LOC~~ SOLR
300.0000 mg | Freq: Once | SUBCUTANEOUS | Status: AC
Start: 2016-11-18 — End: 2016-11-18
  Administered 2016-11-18: 300 mg via SUBCUTANEOUS

## 2016-11-23 DIAGNOSIS — M6389 Disorders of muscle in diseases classified elsewhere, multiple sites: Secondary | ICD-10-CM | POA: Diagnosis not present

## 2016-11-23 DIAGNOSIS — M25361 Other instability, right knee: Secondary | ICD-10-CM | POA: Diagnosis not present

## 2016-11-23 DIAGNOSIS — R296 Repeated falls: Secondary | ICD-10-CM | POA: Diagnosis not present

## 2016-11-23 DIAGNOSIS — R278 Other lack of coordination: Secondary | ICD-10-CM | POA: Diagnosis not present

## 2016-11-23 DIAGNOSIS — M1711 Unilateral primary osteoarthritis, right knee: Secondary | ICD-10-CM | POA: Diagnosis not present

## 2016-11-23 DIAGNOSIS — M25561 Pain in right knee: Secondary | ICD-10-CM | POA: Diagnosis not present

## 2016-11-24 ENCOUNTER — Encounter: Payer: Self-pay | Admitting: Internal Medicine

## 2016-11-24 DIAGNOSIS — M6389 Disorders of muscle in diseases classified elsewhere, multiple sites: Secondary | ICD-10-CM | POA: Diagnosis not present

## 2016-11-24 DIAGNOSIS — R296 Repeated falls: Secondary | ICD-10-CM | POA: Diagnosis not present

## 2016-11-24 DIAGNOSIS — M1711 Unilateral primary osteoarthritis, right knee: Secondary | ICD-10-CM | POA: Diagnosis not present

## 2016-11-24 DIAGNOSIS — M25561 Pain in right knee: Secondary | ICD-10-CM | POA: Diagnosis not present

## 2016-11-24 DIAGNOSIS — R278 Other lack of coordination: Secondary | ICD-10-CM | POA: Diagnosis not present

## 2016-11-24 DIAGNOSIS — M25361 Other instability, right knee: Secondary | ICD-10-CM | POA: Diagnosis not present

## 2016-11-24 NOTE — Progress Notes (Deleted)
Patient ID: [name], female   DOB: [birth year], 83 y.o.   MRN: [id number]  Provider:   Location:  [facility] Room Number: 269 memory care Place of Service:  ALF ([phone])  PCP: [name], MD Patient Care Team: [name], MD as PCP - General (Internal Medicine) Elsie Stain, MD (Pulmonary Disease)  Extended Emergency Contact Information Primary Emergency Contact: Earlene Plater of [place] Phone: [phone] Mobile Phone: [phone] Relation: Daughter  Code Status: *** Goals of Care: Advanced Directive information Advanced Directives 11/24/2016  Does Patient Have a Medical Advance Directive? Yes  Type of Advance Directive Out of facility DNR (pink MOST or yellow form);[place];Living will  Copy of [place] in Chart? Yes  Pre-existing out of facility DNR order (yellow form or pink MOST form) Yellow form placed in chart (order not valid for inpatient use)      Chief Complaint  Patient presents with  . New Admit To SNF    memory care admission    HPI: Patient is a 83 y.o. female seen today for admission to  Past Medical History:  Diagnosis Date  . Arthritis   . Asthma   . Depression   . Fall   . Hyperlipidemia   . Hypertension   . Memory loss    Past Surgical History:  Procedure Laterality Date  . HIP ARTHROPLASTY  06/25/2011   Procedure: ARTHROPLASTY BIPOLAR HIP;  Surgeon: Laurice Record [place];  Location: [facility];  Service: Orthopedics;  Laterality: Right;  Marland Kitchen VAGINAL HYSTERECTOMY      reports that she has never smoked. She has never used smokeless tobacco. She reports that she drinks alcohol. She reports that she does not use drugs. Social History   Social History  . Marital status: Married    Spouse name: N/A  . Number of children: 1  . Years of education: 53   Occupational History  . retired Retired   Social History Main Topics  . Smoking  status: Never Smoker  . Smokeless tobacco: Never Used  . Alcohol use 0.0 oz/week     Comment: 1 glass daily  . Drug use: No  . Sexual activity: Not on file   Other Topics Concern  . Not on file   Social History Narrative   Originally from [place]. She previously lived in [place]. 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 No bird exposure. No recent mold exposure. Previous hold did have mold.       Lives at [facility] retirement community: assistive living   Caffeine use: 2 cups coffee /day    Functional Status Survey:    Family History  Problem Relation Age of Onset  . Asthma    . Colon cancer Father   . Dementia Neg Hx     Health Maintenance  Topic Date Due  . TETANUS/TDAP  09/25/1944  . DEXA SCAN  09/25/1990  . INFLUENZA VACCINE  03/03/2017  . PNA vac Low Risk Adult  Completed    Allergies  Allergen Reactions  . Azithromycin Other (See Comments)    Nausea, weakness   . Aspirin   . Sulfonamide Derivatives     Outpatient Encounter Prescriptions as of 11/24/2016  Medication Sig  . albuterol (PROVENTIL HFA;VENTOLIN HFA) 108 (90 Base) MCG/ACT inhaler Inhale 1-2 puffs into the lungs every 6 (six) hours as needed for wheezing or shortness of breath.  . Calcium Carbonate-Vitamin D3 (CALCIUM 600-D) 600-400 MG-UNIT TABS Take  by mouth daily.  . cetirizine (ZYRTEC) 10 MG tablet Take 10 mg by mouth at bedtime.  . Cholecalciferol (VITAMIN D-3) 1000 units CAPS Take by mouth daily.  . Cyanocobalamin (VITAMIN B-12) 3000 MCG SUBL Place under the tongue daily.  Marland Kitchen dextromethorphan-guaiFENesin (MUCINEX DM) 30-600 MG 12hr tablet Take 1 tablet by mouth 2 (two) times daily.  . diphenhydramine-acetaminophen (TYLENOL PM) 25-500 MG TABS tablet Take 1 tablet by mouth at bedtime as needed.  . docusate sodium (COLACE) 100 MG capsule Take 100 mg by mouth daily.  Marland Kitchen FLUoxetine (PROZAC) 20 MG capsule Take 40 mg by mouth daily.   . fluticasone (FLONASE) 50 MCG/ACT nasal spray instill 2 sprays into each  nostril once daily  . fluticasone furoate-vilanterol (BREO ELLIPTA) 100-25 MCG/INH AEPB Inhale 1 puff into the lungs daily.  Marland Kitchen loratadine (CLARITIN) 10 MG tablet Take 10 mg by mouth daily.  . memantine (NAMENDA) 10 MG tablet Take 1 tablet (10 mg total) by mouth 2 (two) times daily.  . montelukast (SINGULAIR) 10 MG tablet Take 1 tablet (10 mg total) by mouth daily.  Marland Kitchen olmesartan (BENICAR) 40 MG tablet Take 40 mg by mouth daily.  Marland Kitchen omalizumab (XOLAIR) 150 MG injection Inject 150 mg into the skin every 14 (fourteen) days.  Marland Kitchen omeprazole (PRILOSEC) 20 MG capsule take 1 capsule by mouth once daily 30 MINUTES BEFORE A MEAL  . promethazine (PHENERGAN) 25 MG tablet Take 25 mg by mouth 3 (three) times daily as needed for nausea or vomiting.  . [DISCONTINUED] acetaminophen (TYLENOL) 500 MG tablet Take 500 mg by mouth every 4 (four) hours as needed.    . [DISCONTINUED] Polyethyl Glycol-Propyl Glycol (SYSTANE) 0.4-0.3 % SOLN Apply to eye as needed (dry eyes).   No facility-administered encounter medications on file as of 11/24/2016.     Review of Systems  Vitals:   11/24/16 1131  BP: 119/64  Pulse: 70  Resp: 16  Temp: 97.9 F (36.6 C)  TempSrc: Oral  SpO2: 98%  Weight: 121 lb (54.9 kg)   Body mass index is 21.1 kg/m. Physical Exam  Labs reviewed: Basic Metabolic Panel: No results for input(s): NA, K, CL, CO2, GLUCOSE, BUN, CREATININE, CALCIUM, MG, PHOS in the last 8760 hours. Liver Function Tests: No results for input(s): AST, ALT, ALKPHOS, BILITOT, PROT, ALBUMIN in the last 8760 hours. No results for input(s): LIPASE, AMYLASE in the last 8760 hours. No results for input(s): AMMONIA in the last 8760 hours. CBC: No results for input(s): WBC, NEUTROABS, HGB, HCT, MCV, PLT in the last 8760 hours. Cardiac Enzymes: No results for input(s): CKTOTAL, CKMB, CKMBINDEX, TROPONINI in the last 8760 hours. BNP: Invalid input(s): POCBNP No results found for: HGBA1C Lab Results  Component Value  Date   TSH 2.440 09/04/2015   Lab Results  Component Value Date   ZESPQZRA07 622 09/04/2015   Lab Results  Component Value Date   FOLATE 5.1 09/04/2015   No results found for: IRON, TIBC, FERRITIN  Imaging and Procedures obtained prior to SNF admission: Ct Abdomen Pelvis W Contrast  Result Date: 05/06/2016 CLINICAL DATA:  Hx of endometritous Nausea x 6 weeks on medsCurrent pneumonia x 4 days with abxAppendix / total hyster / bladder tact No hx of ca EXAM: CT ABDOMEN AND PELVIS WITH CONTRAST TECHNIQUE: Multidetector CT imaging of the abdomen and pelvis was performed using the standard protocol following bolus administration of intravenous contrast. CONTRAST:  181mL ISOVUE-300 IOPAMIDOL (ISOVUE-300) INJECTION 61% COMPARISON:  None. FINDINGS: Lower chest: No acute abnormality. Hepatobiliary:  No focal liver abnormality is seen. No gallstones, gallbladder wall thickening, or biliary dilatation. Pancreas: Unremarkable. No pancreatic ductal dilatation or surrounding inflammatory changes. Spleen: Normal in size without focal abnormality. Adrenals/Urinary Tract: No adrenal masses. There are several small renal lesions that are low in attenuation likely cysts. There is a hyper attenuating 8 mm lesion from the posterior midpole the right kidney likely a cyst complicated by previous hemorrhage or proteinaceous content. No other renal lesions. No renal stones. No hydronephrosis. Ureters normal course and caliber. Bladder is unremarkable. Stomach/Bowel: Mild increased stool is noted throughout the colon. There is no colonic wall thickening or inflammatory change. Stomach is unremarkable. Normal appearance of the small bowel. Vascular/Lymphatic: Scattered aortic vascular calcifications. No aneurysm. No adenopathy. Reproductive: Uterus surgically absent.  No adnexal/ pelvic masses Other: No abdominal wall hernia or abnormality. No abdominopelvic ascites. Musculoskeletal: Right hip total arthroplasty is well-seated  and aligned. There is mild depression of the upper endplate of L1 that appears chronic. Bones are demineralized. There are degenerative changes of visualized spine. No osteoblastic or osteolytic lesions. IMPRESSION: 1. No acute findings in the abdomen or pelvis. 2. Mild increased stool in the colon. No bowel inflammation or obstruction. 3. Small low-density renal lesions most likely cysts. 4. Aortic atherosclerosis. Electronically Signed   By: Lajean Manes M.D.   On: 05/06/2016 15:54    Assessment/Plan There are no diagnoses linked to this encounter.   Family/ staff Communication:   Labs/tests ordered:

## 2016-12-01 ENCOUNTER — Telehealth: Payer: Self-pay | Admitting: Pulmonary Disease

## 2016-12-01 NOTE — Telephone Encounter (Signed)
#   vials:4 Ordered date:12/01/16 Shipping Date:12/01/16

## 2016-12-02 ENCOUNTER — Ambulatory Visit (INDEPENDENT_AMBULATORY_CARE_PROVIDER_SITE_OTHER): Payer: Medicare Other

## 2016-12-02 DIAGNOSIS — J4540 Moderate persistent asthma, uncomplicated: Secondary | ICD-10-CM

## 2016-12-02 DIAGNOSIS — M6389 Disorders of muscle in diseases classified elsewhere, multiple sites: Secondary | ICD-10-CM | POA: Diagnosis not present

## 2016-12-02 DIAGNOSIS — M25361 Other instability, right knee: Secondary | ICD-10-CM | POA: Diagnosis not present

## 2016-12-02 DIAGNOSIS — M1711 Unilateral primary osteoarthritis, right knee: Secondary | ICD-10-CM | POA: Diagnosis not present

## 2016-12-02 DIAGNOSIS — R278 Other lack of coordination: Secondary | ICD-10-CM | POA: Diagnosis not present

## 2016-12-02 DIAGNOSIS — M25561 Pain in right knee: Secondary | ICD-10-CM | POA: Diagnosis not present

## 2016-12-02 DIAGNOSIS — R296 Repeated falls: Secondary | ICD-10-CM | POA: Diagnosis not present

## 2016-12-02 NOTE — Telephone Encounter (Signed)
#   Vials:4 Arrival Date:12/02/16 Lot #:[id number] Exp Date:9/21

## 2016-12-04 DIAGNOSIS — E871 Hypo-osmolality and hyponatremia: Secondary | ICD-10-CM | POA: Diagnosis not present

## 2016-12-04 DIAGNOSIS — R278 Other lack of coordination: Secondary | ICD-10-CM | POA: Diagnosis not present

## 2016-12-04 DIAGNOSIS — F331 Major depressive disorder, recurrent, moderate: Secondary | ICD-10-CM | POA: Diagnosis not present

## 2016-12-04 DIAGNOSIS — R413 Other amnesia: Secondary | ICD-10-CM | POA: Diagnosis not present

## 2016-12-04 DIAGNOSIS — M1711 Unilateral primary osteoarthritis, right knee: Secondary | ICD-10-CM | POA: Diagnosis not present

## 2016-12-04 DIAGNOSIS — R32 Unspecified urinary incontinence: Secondary | ICD-10-CM | POA: Diagnosis not present

## 2016-12-04 DIAGNOSIS — N183 Chronic kidney disease, stage 3 (moderate): Secondary | ICD-10-CM | POA: Diagnosis not present

## 2016-12-04 DIAGNOSIS — M25361 Other instability, right knee: Secondary | ICD-10-CM | POA: Diagnosis not present

## 2016-12-04 DIAGNOSIS — I129 Hypertensive chronic kidney disease with stage 1 through stage 4 chronic kidney disease, or unspecified chronic kidney disease: Secondary | ICD-10-CM | POA: Diagnosis not present

## 2016-12-04 DIAGNOSIS — M1990 Unspecified osteoarthritis, unspecified site: Secondary | ICD-10-CM | POA: Diagnosis not present

## 2016-12-04 DIAGNOSIS — M810 Age-related osteoporosis without current pathological fracture: Secondary | ICD-10-CM | POA: Diagnosis not present

## 2016-12-04 DIAGNOSIS — M25561 Pain in right knee: Secondary | ICD-10-CM | POA: Diagnosis not present

## 2016-12-04 DIAGNOSIS — M6389 Disorders of muscle in diseases classified elsewhere, multiple sites: Secondary | ICD-10-CM | POA: Diagnosis not present

## 2016-12-04 DIAGNOSIS — E559 Vitamin D deficiency, unspecified: Secondary | ICD-10-CM | POA: Diagnosis not present

## 2016-12-04 DIAGNOSIS — R159 Full incontinence of feces: Secondary | ICD-10-CM | POA: Diagnosis not present

## 2016-12-04 DIAGNOSIS — E538 Deficiency of other specified B group vitamins: Secondary | ICD-10-CM | POA: Diagnosis not present

## 2016-12-04 DIAGNOSIS — Z6823 Body mass index (BMI) 23.0-23.9, adult: Secondary | ICD-10-CM | POA: Diagnosis not present

## 2016-12-04 DIAGNOSIS — R296 Repeated falls: Secondary | ICD-10-CM | POA: Diagnosis not present

## 2016-12-04 MED ORDER — OMALIZUMAB 150 MG ~~LOC~~ SOLR
300.0000 mg | Freq: Once | SUBCUTANEOUS | Status: AC
  Administered 2016-12-02: 300 mg via SUBCUTANEOUS

## 2016-12-07 DIAGNOSIS — M25561 Pain in right knee: Secondary | ICD-10-CM | POA: Diagnosis not present

## 2016-12-07 DIAGNOSIS — M6389 Disorders of muscle in diseases classified elsewhere, multiple sites: Secondary | ICD-10-CM | POA: Diagnosis not present

## 2016-12-07 DIAGNOSIS — R296 Repeated falls: Secondary | ICD-10-CM | POA: Diagnosis not present

## 2016-12-07 DIAGNOSIS — M25361 Other instability, right knee: Secondary | ICD-10-CM | POA: Diagnosis not present

## 2016-12-07 DIAGNOSIS — R278 Other lack of coordination: Secondary | ICD-10-CM | POA: Diagnosis not present

## 2016-12-07 DIAGNOSIS — M1711 Unilateral primary osteoarthritis, right knee: Secondary | ICD-10-CM | POA: Diagnosis not present

## 2016-12-08 DIAGNOSIS — D692 Other nonthrombocytopenic purpura: Secondary | ICD-10-CM | POA: Diagnosis not present

## 2016-12-08 DIAGNOSIS — L821 Other seborrheic keratosis: Secondary | ICD-10-CM | POA: Diagnosis not present

## 2016-12-08 DIAGNOSIS — Z85828 Personal history of other malignant neoplasm of skin: Secondary | ICD-10-CM | POA: Diagnosis not present

## 2016-12-08 DIAGNOSIS — D225 Melanocytic nevi of trunk: Secondary | ICD-10-CM | POA: Diagnosis not present

## 2016-12-08 DIAGNOSIS — D1801 Hemangioma of skin and subcutaneous tissue: Secondary | ICD-10-CM | POA: Diagnosis not present

## 2016-12-08 DIAGNOSIS — D2272 Melanocytic nevi of left lower limb, including hip: Secondary | ICD-10-CM | POA: Diagnosis not present

## 2016-12-09 DIAGNOSIS — R278 Other lack of coordination: Secondary | ICD-10-CM | POA: Diagnosis not present

## 2016-12-09 DIAGNOSIS — M1711 Unilateral primary osteoarthritis, right knee: Secondary | ICD-10-CM | POA: Diagnosis not present

## 2016-12-09 DIAGNOSIS — R296 Repeated falls: Secondary | ICD-10-CM | POA: Diagnosis not present

## 2016-12-09 DIAGNOSIS — M25561 Pain in right knee: Secondary | ICD-10-CM | POA: Diagnosis not present

## 2016-12-09 DIAGNOSIS — M25361 Other instability, right knee: Secondary | ICD-10-CM | POA: Diagnosis not present

## 2016-12-09 DIAGNOSIS — M6389 Disorders of muscle in diseases classified elsewhere, multiple sites: Secondary | ICD-10-CM | POA: Diagnosis not present

## 2016-12-15 DIAGNOSIS — R278 Other lack of coordination: Secondary | ICD-10-CM | POA: Diagnosis not present

## 2016-12-15 DIAGNOSIS — M1711 Unilateral primary osteoarthritis, right knee: Secondary | ICD-10-CM | POA: Diagnosis not present

## 2016-12-15 DIAGNOSIS — R296 Repeated falls: Secondary | ICD-10-CM | POA: Diagnosis not present

## 2016-12-15 DIAGNOSIS — M25561 Pain in right knee: Secondary | ICD-10-CM | POA: Diagnosis not present

## 2016-12-15 DIAGNOSIS — M6389 Disorders of muscle in diseases classified elsewhere, multiple sites: Secondary | ICD-10-CM | POA: Diagnosis not present

## 2016-12-15 DIAGNOSIS — M25361 Other instability, right knee: Secondary | ICD-10-CM | POA: Diagnosis not present

## 2016-12-17 ENCOUNTER — Ambulatory Visit (INDEPENDENT_AMBULATORY_CARE_PROVIDER_SITE_OTHER): Payer: Medicare Other

## 2016-12-17 DIAGNOSIS — J4540 Moderate persistent asthma, uncomplicated: Secondary | ICD-10-CM | POA: Diagnosis not present

## 2016-12-17 DIAGNOSIS — M25361 Other instability, right knee: Secondary | ICD-10-CM | POA: Diagnosis not present

## 2016-12-17 DIAGNOSIS — M1711 Unilateral primary osteoarthritis, right knee: Secondary | ICD-10-CM | POA: Diagnosis not present

## 2016-12-17 DIAGNOSIS — M6389 Disorders of muscle in diseases classified elsewhere, multiple sites: Secondary | ICD-10-CM | POA: Diagnosis not present

## 2016-12-17 DIAGNOSIS — M25561 Pain in right knee: Secondary | ICD-10-CM | POA: Diagnosis not present

## 2016-12-17 DIAGNOSIS — R296 Repeated falls: Secondary | ICD-10-CM | POA: Diagnosis not present

## 2016-12-17 DIAGNOSIS — R278 Other lack of coordination: Secondary | ICD-10-CM | POA: Diagnosis not present

## 2016-12-18 MED ORDER — OMALIZUMAB 150 MG ~~LOC~~ SOLR
300.0000 mg | SUBCUTANEOUS | Status: DC
  Administered 2016-12-17: 300 mg via SUBCUTANEOUS

## 2016-12-18 NOTE — Progress Notes (Signed)
Xolair injection documentation and charges entered by Omer Puccinelli, RMA, based on injection sheet filled out by Tammy Scott per office protocol.   

## 2016-12-21 DIAGNOSIS — R278 Other lack of coordination: Secondary | ICD-10-CM | POA: Diagnosis not present

## 2016-12-21 DIAGNOSIS — M25561 Pain in right knee: Secondary | ICD-10-CM | POA: Diagnosis not present

## 2016-12-21 DIAGNOSIS — M25361 Other instability, right knee: Secondary | ICD-10-CM | POA: Diagnosis not present

## 2016-12-21 DIAGNOSIS — M1711 Unilateral primary osteoarthritis, right knee: Secondary | ICD-10-CM | POA: Diagnosis not present

## 2016-12-21 DIAGNOSIS — R296 Repeated falls: Secondary | ICD-10-CM | POA: Diagnosis not present

## 2016-12-21 DIAGNOSIS — M6389 Disorders of muscle in diseases classified elsewhere, multiple sites: Secondary | ICD-10-CM | POA: Diagnosis not present

## 2016-12-23 ENCOUNTER — Telehealth: Payer: Self-pay | Admitting: Pulmonary Disease

## 2016-12-23 NOTE — Telephone Encounter (Addendum)
#   vials:4 Ordered date:12/23/16 Shipping Date:12/23/16

## 2016-12-24 DIAGNOSIS — M6389 Disorders of muscle in diseases classified elsewhere, multiple sites: Secondary | ICD-10-CM | POA: Diagnosis not present

## 2016-12-24 DIAGNOSIS — M1711 Unilateral primary osteoarthritis, right knee: Secondary | ICD-10-CM | POA: Diagnosis not present

## 2016-12-24 DIAGNOSIS — M25361 Other instability, right knee: Secondary | ICD-10-CM | POA: Diagnosis not present

## 2016-12-24 DIAGNOSIS — R296 Repeated falls: Secondary | ICD-10-CM | POA: Diagnosis not present

## 2016-12-24 DIAGNOSIS — M25561 Pain in right knee: Secondary | ICD-10-CM | POA: Diagnosis not present

## 2016-12-24 DIAGNOSIS — R278 Other lack of coordination: Secondary | ICD-10-CM | POA: Diagnosis not present

## 2016-12-24 NOTE — Telephone Encounter (Signed)
#   Vials:4 Arrival Date:12/24/16 Lot #:[id number] Exp Date:10/21

## 2016-12-27 NOTE — Progress Notes (Deleted)
This encounter was created in error - please disregard.

## 2016-12-27 NOTE — Progress Notes (Signed)
This encounter was created in error - please disregard.

## 2016-12-28 DIAGNOSIS — M25561 Pain in right knee: Secondary | ICD-10-CM | POA: Diagnosis not present

## 2016-12-28 DIAGNOSIS — M25361 Other instability, right knee: Secondary | ICD-10-CM | POA: Diagnosis not present

## 2016-12-28 DIAGNOSIS — M1711 Unilateral primary osteoarthritis, right knee: Secondary | ICD-10-CM | POA: Diagnosis not present

## 2016-12-28 DIAGNOSIS — R296 Repeated falls: Secondary | ICD-10-CM | POA: Diagnosis not present

## 2016-12-28 DIAGNOSIS — M6389 Disorders of muscle in diseases classified elsewhere, multiple sites: Secondary | ICD-10-CM | POA: Diagnosis not present

## 2016-12-28 DIAGNOSIS — R278 Other lack of coordination: Secondary | ICD-10-CM | POA: Diagnosis not present

## 2016-12-30 DIAGNOSIS — M25561 Pain in right knee: Secondary | ICD-10-CM | POA: Diagnosis not present

## 2016-12-30 DIAGNOSIS — M6389 Disorders of muscle in diseases classified elsewhere, multiple sites: Secondary | ICD-10-CM | POA: Diagnosis not present

## 2016-12-30 DIAGNOSIS — R278 Other lack of coordination: Secondary | ICD-10-CM | POA: Diagnosis not present

## 2016-12-30 DIAGNOSIS — M25361 Other instability, right knee: Secondary | ICD-10-CM | POA: Diagnosis not present

## 2016-12-30 DIAGNOSIS — R296 Repeated falls: Secondary | ICD-10-CM | POA: Diagnosis not present

## 2016-12-30 DIAGNOSIS — M1711 Unilateral primary osteoarthritis, right knee: Secondary | ICD-10-CM | POA: Diagnosis not present

## 2016-12-31 ENCOUNTER — Ambulatory Visit (INDEPENDENT_AMBULATORY_CARE_PROVIDER_SITE_OTHER): Payer: Medicare Other

## 2016-12-31 DIAGNOSIS — H04123 Dry eye syndrome of bilateral lacrimal glands: Secondary | ICD-10-CM | POA: Diagnosis not present

## 2016-12-31 DIAGNOSIS — J4540 Moderate persistent asthma, uncomplicated: Secondary | ICD-10-CM

## 2016-12-31 DIAGNOSIS — Z961 Presence of intraocular lens: Secondary | ICD-10-CM | POA: Diagnosis not present

## 2016-12-31 DIAGNOSIS — H43813 Vitreous degeneration, bilateral: Secondary | ICD-10-CM | POA: Diagnosis not present

## 2017-01-01 DIAGNOSIS — M1711 Unilateral primary osteoarthritis, right knee: Secondary | ICD-10-CM | POA: Diagnosis not present

## 2017-01-01 DIAGNOSIS — M25561 Pain in right knee: Secondary | ICD-10-CM | POA: Diagnosis not present

## 2017-01-01 DIAGNOSIS — R296 Repeated falls: Secondary | ICD-10-CM | POA: Diagnosis not present

## 2017-01-01 DIAGNOSIS — M6389 Disorders of muscle in diseases classified elsewhere, multiple sites: Secondary | ICD-10-CM | POA: Diagnosis not present

## 2017-01-01 DIAGNOSIS — M25361 Other instability, right knee: Secondary | ICD-10-CM | POA: Diagnosis not present

## 2017-01-01 DIAGNOSIS — R278 Other lack of coordination: Secondary | ICD-10-CM | POA: Diagnosis not present

## 2017-01-04 DIAGNOSIS — M25361 Other instability, right knee: Secondary | ICD-10-CM | POA: Diagnosis not present

## 2017-01-04 DIAGNOSIS — M25561 Pain in right knee: Secondary | ICD-10-CM | POA: Diagnosis not present

## 2017-01-04 DIAGNOSIS — R296 Repeated falls: Secondary | ICD-10-CM | POA: Diagnosis not present

## 2017-01-04 DIAGNOSIS — M1711 Unilateral primary osteoarthritis, right knee: Secondary | ICD-10-CM | POA: Diagnosis not present

## 2017-01-04 DIAGNOSIS — M6389 Disorders of muscle in diseases classified elsewhere, multiple sites: Secondary | ICD-10-CM | POA: Diagnosis not present

## 2017-01-04 DIAGNOSIS — R278 Other lack of coordination: Secondary | ICD-10-CM | POA: Diagnosis not present

## 2017-01-04 MED ORDER — OMALIZUMAB 150 MG ~~LOC~~ SOLR
300.0000 mg | Freq: Once | SUBCUTANEOUS | Status: AC
  Administered 2016-12-31: 300 mg via SUBCUTANEOUS

## 2017-01-14 ENCOUNTER — Ambulatory Visit: Payer: Self-pay

## 2017-01-18 ENCOUNTER — Ambulatory Visit (INDEPENDENT_AMBULATORY_CARE_PROVIDER_SITE_OTHER): Payer: Medicare Other

## 2017-01-18 DIAGNOSIS — J4540 Moderate persistent asthma, uncomplicated: Secondary | ICD-10-CM

## 2017-01-20 MED ORDER — OMALIZUMAB 150 MG ~~LOC~~ SOLR
300.0000 mg | Freq: Once | SUBCUTANEOUS | Status: AC
  Administered 2017-01-18: 300 mg via SUBCUTANEOUS

## 2017-01-25 ENCOUNTER — Telehealth: Payer: Self-pay | Admitting: Pulmonary Disease

## 2017-01-26 ENCOUNTER — Telehealth: Payer: Self-pay | Admitting: Pulmonary Disease

## 2017-01-26 NOTE — Telephone Encounter (Signed)
#   Vials: 4 Arrival Date:6/26/128 Lot #:[id number] Exp Date:11/21

## 2017-01-26 NOTE — Telephone Encounter (Signed)
Created in error

## 2017-01-26 NOTE — Telephone Encounter (Signed)
#   vials:4 Ordered date:01/25/17 Shipping Date:01/25/17 Forgot to create [employer] text.

## 2017-02-01 ENCOUNTER — Ambulatory Visit (INDEPENDENT_AMBULATORY_CARE_PROVIDER_SITE_OTHER): Payer: Medicare Other

## 2017-02-01 DIAGNOSIS — J4540 Moderate persistent asthma, uncomplicated: Secondary | ICD-10-CM | POA: Diagnosis not present

## 2017-02-02 MED ORDER — OMALIZUMAB 150 MG ~~LOC~~ SOLR
300.0000 mg | SUBCUTANEOUS | Status: DC
Start: 2017-02-02 — End: 2017-03-16
  Administered 2017-02-01: 300 mg via SUBCUTANEOUS

## 2017-02-02 NOTE — Progress Notes (Signed)
Xolair injection documentation and charges entered by Ashley Caulfield, RMA, based on injection sheet filled out by Tammy Scott per office protocol.   

## 2017-02-05 DIAGNOSIS — J811 Chronic pulmonary edema: Secondary | ICD-10-CM | POA: Diagnosis not present

## 2017-02-15 ENCOUNTER — Ambulatory Visit (INDEPENDENT_AMBULATORY_CARE_PROVIDER_SITE_OTHER): Payer: Medicare Other

## 2017-02-15 DIAGNOSIS — J4540 Moderate persistent asthma, uncomplicated: Secondary | ICD-10-CM

## 2017-02-15 MED ORDER — OMALIZUMAB 150 MG ~~LOC~~ SOLR
300.0000 mg | SUBCUTANEOUS | Status: DC
  Administered 2017-02-15: 300 mg via SUBCUTANEOUS

## 2017-02-17 ENCOUNTER — Telehealth: Payer: Self-pay | Admitting: Pulmonary Disease

## 2017-02-18 ENCOUNTER — Telehealth: Payer: Self-pay | Admitting: Pulmonary Disease

## 2017-02-18 NOTE — Telephone Encounter (Signed)
#   vials:4 Ordered date:02/17/17 Shipping Date:02/17/17 forgot to put order in.

## 2017-02-18 NOTE — Telephone Encounter (Signed)
#   Vials: 4 Arrival Date:02/18/17 Lot #:[id number] Exp Date:07/2020

## 2017-02-18 NOTE — Telephone Encounter (Signed)
Created in error

## 2017-03-01 ENCOUNTER — Ambulatory Visit (INDEPENDENT_AMBULATORY_CARE_PROVIDER_SITE_OTHER): Payer: Medicare Other

## 2017-03-01 DIAGNOSIS — M25361 Other instability, right knee: Secondary | ICD-10-CM | POA: Diagnosis not present

## 2017-03-01 DIAGNOSIS — R278 Other lack of coordination: Secondary | ICD-10-CM | POA: Diagnosis not present

## 2017-03-01 DIAGNOSIS — J4520 Mild intermittent asthma, uncomplicated: Secondary | ICD-10-CM | POA: Diagnosis not present

## 2017-03-01 DIAGNOSIS — R296 Repeated falls: Secondary | ICD-10-CM | POA: Diagnosis not present

## 2017-03-01 DIAGNOSIS — M6389 Disorders of muscle in diseases classified elsewhere, multiple sites: Secondary | ICD-10-CM | POA: Diagnosis not present

## 2017-03-02 MED ORDER — OMALIZUMAB 150 MG ~~LOC~~ SOLR
300.0000 mg | SUBCUTANEOUS | Status: DC
  Administered 2017-03-02: 300 mg via SUBCUTANEOUS

## 2017-03-02 NOTE — Progress Notes (Signed)
Documentation of medication administration and charges of Xolair have been completed by Abraham Entwistle, CMA based on the Xolair documentation sheet completed by Tammy Scott.  

## 2017-03-05 DIAGNOSIS — M6389 Disorders of muscle in diseases classified elsewhere, multiple sites: Secondary | ICD-10-CM | POA: Diagnosis not present

## 2017-03-05 DIAGNOSIS — R296 Repeated falls: Secondary | ICD-10-CM | POA: Diagnosis not present

## 2017-03-05 DIAGNOSIS — G301 Alzheimer's disease with late onset: Secondary | ICD-10-CM | POA: Diagnosis not present

## 2017-03-05 DIAGNOSIS — R1313 Dysphagia, pharyngeal phase: Secondary | ICD-10-CM | POA: Diagnosis not present

## 2017-03-05 DIAGNOSIS — R278 Other lack of coordination: Secondary | ICD-10-CM | POA: Diagnosis not present

## 2017-03-05 DIAGNOSIS — M25361 Other instability, right knee: Secondary | ICD-10-CM | POA: Diagnosis not present

## 2017-03-08 DIAGNOSIS — M6389 Disorders of muscle in diseases classified elsewhere, multiple sites: Secondary | ICD-10-CM | POA: Diagnosis not present

## 2017-03-08 DIAGNOSIS — R296 Repeated falls: Secondary | ICD-10-CM | POA: Diagnosis not present

## 2017-03-08 DIAGNOSIS — M25361 Other instability, right knee: Secondary | ICD-10-CM | POA: Diagnosis not present

## 2017-03-08 DIAGNOSIS — R278 Other lack of coordination: Secondary | ICD-10-CM | POA: Diagnosis not present

## 2017-03-08 DIAGNOSIS — R1313 Dysphagia, pharyngeal phase: Secondary | ICD-10-CM | POA: Diagnosis not present

## 2017-03-08 DIAGNOSIS — G301 Alzheimer's disease with late onset: Secondary | ICD-10-CM | POA: Diagnosis not present

## 2017-03-10 ENCOUNTER — Encounter: Payer: Self-pay | Admitting: Adult Health

## 2017-03-10 ENCOUNTER — Ambulatory Visit (INDEPENDENT_AMBULATORY_CARE_PROVIDER_SITE_OTHER): Payer: Medicare Other | Admitting: Adult Health

## 2017-03-10 VITALS — BP 128/63 | HR 76 | Ht 63.5 in

## 2017-03-10 DIAGNOSIS — M6389 Disorders of muscle in diseases classified elsewhere, multiple sites: Secondary | ICD-10-CM | POA: Diagnosis not present

## 2017-03-10 DIAGNOSIS — R1313 Dysphagia, pharyngeal phase: Secondary | ICD-10-CM | POA: Diagnosis not present

## 2017-03-10 DIAGNOSIS — R278 Other lack of coordination: Secondary | ICD-10-CM | POA: Diagnosis not present

## 2017-03-10 DIAGNOSIS — F0390 Unspecified dementia without behavioral disturbance: Secondary | ICD-10-CM

## 2017-03-10 DIAGNOSIS — M25361 Other instability, right knee: Secondary | ICD-10-CM | POA: Diagnosis not present

## 2017-03-10 DIAGNOSIS — R296 Repeated falls: Secondary | ICD-10-CM | POA: Diagnosis not present

## 2017-03-10 DIAGNOSIS — G301 Alzheimer's disease with late onset: Secondary | ICD-10-CM | POA: Diagnosis not present

## 2017-03-10 NOTE — Progress Notes (Signed)
PATIENT: [name] LKGMWNUU DOB: Aug 28, 1923  REASON FOR VISIT: follow up- Dementia HISTORY FROM: patient  HISTORY OF PRESENT ILLNESS: Today 03/10/17:  [name] is a 83 year old female with a history of memory disturbance. She returns today for follow-up. She currently resides at [facility]. She does require some assistance with ADLs. She is in the memory unit. She denies any trouble sleeping. Good appetite. Daughter reports that she notices that she is more confused at times but this does not seem to bother the patient per the daughter. The patient is currently on Namenda 10 mg twice a day. She has tried Aricept but had GI upset. However the daughter feels that this may not have been related to Aricept but issues with her stomach that was occurring at that time.   HISTORY 09/08/2016: Here for follow up of dementia She is on namenda. Did not tolerate AChesterase meds.Will continue namenda. Her right knee is giving out causing falls but she is in assisted living now with in-home care. Will increase Namenda to 10mg  twice daily. She is in assisted living. Here with her daughter. She gets confused and forgets more. Here with her daughter Mcmeans. She gets a little more confused. She still asks if she has another apartment she is going back to, sometimes she thinks things are going on that are not such as she said they rolled her into an empty house and remembering things that did not happen. She is not remembering as well as she did. She is administered medicine by caretakers. Sometimes she turns the wrong way to go to the dining hall and gets confused. She did say that her husband visited her one night but at the time she had a virus. Otherwise no hallucinations or delusions or behavioral issues. Prozac is working well. She has been in assisted living since last fall. She is having shots in the knees. She is in PT.   Interval history 03/04/2016: Memory is worsening. She did not tolerate Aricept or Exelon  patch due to GI effects, made her sick. Her B12 and TSH were normal. Had a long discussion about dementia, this is a progressive memory disorder. Other medications we can try include Namenda, will start today, discussed side effects. Daughter here and provides most information.   Reviewed images and findings with patient and daughter:C/w dementia. Clinically appears to be Alzheimer's, may be a vascular component as well  This MRI of the brain without contrast shows the following: 1. Generalized cortical atrophy that has progressed since 2012. The atrophy is most pronounced in the mesial temporal lobes. 2. Widespread T2/FLAIR hyperintense foci in both hemispheres, pons and cerebellum consistent with chronic microvascular ischemic changes. Many of these changes in the hemispheres and the cerebellum were present in 2012. 3. Although the signal changes in the pons most likely represent chronic microvascular ischemic foci, superimposed metabolic issues cannot be ruled out. 4. Chronic inflammation of the right frontal, right ethmoid cells, right hemi-sphenoid and right maxillary sinusitis with superimposed acute right maxillary sinusitis.  Julia M Hornadayis a very pleasant 83 y.o.femalehere as a referral from Dr. Lenor Derrick memory loss. PMHx of Asthma, gerd, depression, cataracts. Started when she had a hip replacement after anesthesia 3-4 years ago. That's when daughter, who provides most information, first noticed it. It was mild at the time, this past fall started progressively worsening. The last 2 months it has gotten a lot worse. She gets confused more. She had a physical therapy appointment and she was confused  when she had a new therapist she didn't know. She gets confused about appointments or whether daughter is working or not. Patient gets confused about where daughter is even though it is a work day and she should know where daughter is(at work). She lives in [place]  alone in an apartment. She misses medications occasionally, daughter manages the finances, she does not drive because she felt her reaction time was less, she misplaces things and can't find them occasionally, patient is independent with most ADLs and IADLs. She eats in the [employer]. She has depression and it well controlled, daughter doesn't notice mood problems, she is not as outgoing as she was. No hallucinations or delusions. She is more fatigued.   Reviewed notes, labs and imaging from outside physicians, which showed:  Ct of the head 01/2011: personally reviewed images and agree with the following:  CT HEAD WITHOUT CONTRAST  Soft tissue windows demonstrate expected cerebral atrophy. Mild low density in the periventricular white matter likely related to small vessel disease. Slightly asymmetric, greater left than right. No mass lesion, hemorrhage, hydrocephalus, acute infarct, intra-axial, or extra-axial fluid collection.  IMPRESSION:personally reviewed and agree with the following:  1. No acute intracranial abnormality. 2. Cerebral atrophy and small vessel ischemic change. 3. Soft tissue swelling about the left supraorbital region.   REVIEW OF SYSTEMS: Out of a complete 14 system review of symptoms, the patient complains only of the following symptoms, and all other reviewed systems are negative.  Joint pain, walking difficulty, neck pain, neck stiffness, confusion, decreased concentration, depression, weakness, memory loss, bruise/bleed easily, environmental allergies, incontinence of bladder, leg swelling, snoring, cough, wheezing, hearing loss, fatigue  ALLERGIES: Allergies  Allergen Reactions  . Azithromycin Other (See Comments)    Nausea, weakness   . Aspirin   . Sulfonamide Derivatives     HOME MEDICATIONS: Outpatient Medications Prior to Visit  Medication Sig Dispense Refill  . albuterol (PROVENTIL HFA;VENTOLIN HFA) 108 (90 Base) MCG/ACT inhaler  Inhale 1-2 puffs into the lungs every 6 (six) hours as needed for wheezing or shortness of breath. 1 Inhaler 6  . Calcium Carbonate-Vitamin D3 (CALCIUM 600-D) 600-400 MG-UNIT TABS Take by mouth daily.    . cetirizine (ZYRTEC) 10 MG tablet Take 10 mg by mouth at bedtime.    . Cholecalciferol (VITAMIN D-3) 1000 units CAPS Take by mouth daily.    . Cyanocobalamin (VITAMIN B-12) 3000 MCG SUBL Place under the tongue daily.    Marland Kitchen dextromethorphan-guaiFENesin (MUCINEX DM) 30-600 MG 12hr tablet Take 1 tablet by mouth 2 (two) times daily.    . diphenhydramine-acetaminophen (TYLENOL PM) 25-500 MG TABS tablet Take 1 tablet by mouth at bedtime as needed.    . docusate sodium (COLACE) 100 MG capsule Take 100 mg by mouth daily.    Marland Kitchen FLUoxetine (PROZAC) 20 MG capsule Take 40 mg by mouth daily.     . fluticasone (FLONASE) 50 MCG/ACT nasal spray instill 2 sprays into each nostril once daily 16 g 6  . fluticasone furoate-vilanterol (BREO ELLIPTA) 100-25 MCG/INH AEPB Inhale 1 puff into the lungs daily. 60 each 6  . loratadine (CLARITIN) 10 MG tablet Take 10 mg by mouth daily.    . memantine (NAMENDA) 10 MG tablet Take 1 tablet (10 mg total) by mouth 2 (two) times daily. 60 tablet 11  . montelukast (SINGULAIR) 10 MG tablet Take 1 tablet (10 mg total) by mouth daily. 30 tablet 6  . olmesartan (BENICAR) 40 MG tablet Take 40 mg by mouth daily.    Marland Kitchen  omeprazole (PRILOSEC) 20 MG capsule take 1 capsule by mouth once daily 30 MINUTES BEFORE A MEAL  0  . promethazine (PHENERGAN) 25 MG tablet Take 25 mg by mouth 3 (three) times daily as needed for nausea or vomiting.     Facility-Administered Medications Prior to Visit  Medication Dose Route Frequency Provider Last Rate Last Dose  . omalizumab Arvid Right) injection 300 mg  300 mg Subcutaneous Q14 Days Javier Glazier, MD   300 mg at 02/01/17 0277  . omalizumab Arvid Right) injection 300 mg  300 mg Subcutaneous Q14 Days Javier Glazier, MD   300 mg at 02/15/17 1352  .  omalizumab Arvid Right) injection 300 mg  300 mg Subcutaneous Q14 Days Javier Glazier, MD   300 mg at 03/02/17 1617    PAST MEDICAL HISTORY: Past Medical History:  Diagnosis Date  . Arthritis   . Asthma   . Depression   . Fall   . Hyperlipidemia   . Hypertension   . Memory loss     PAST SURGICAL HISTORY: Past Surgical History:  Procedure Laterality Date  . HIP ARTHROPLASTY  06/25/2011   Procedure: ARTHROPLASTY BIPOLAR HIP;  Surgeon: Laurice Record Aplington;  Location: [facility];  Service: Orthopedics;  Laterality: Right;  Marland Kitchen VAGINAL HYSTERECTOMY      FAMILY HISTORY: Family History  Problem Relation Age of Onset  . Asthma Unknown   . Colon cancer Father   . Dementia Neg Hx     SOCIAL HISTORY: Social History   Social History  . Marital status: Married    Spouse name: N/A  . Number of children: 1  . Years of education: 2   Occupational History  . retired Retired   Social History Main Topics  . Smoking status: Never Smoker  . Smokeless tobacco: Never Used  . Alcohol use 0.0 oz/week     Comment: 1 glass daily  . Drug use: No  . Sexual activity: Not on file   Other Topics Concern  . Not on file   Social History Narrative   Originally from [place]. She previously lived in [place]. 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 No bird exposure. No recent mold exposure. Previous hold did have mold.       Lives at [facility] retirement community: assistive living   Caffeine use: 2 cups coffee /day      PHYSICAL EXAM  Vitals:   03/10/17 1323  BP: 128/63  Pulse: 76  Height: 5' 3.5" (1.613 m)   There is no height or weight on file to calculate BMI.   Generalized: Well developed, in no acute distress   Neurological examination  Mentation: Alert. Follows all commands speech and language fluent. MMSE 23/30 Cranial nerve II-XII: Pupils were equal round reactive to light. Extraocular movements were full, visual field were full on confrontational test. Facial sensation and  strength were normal. Uvula tongue midline. Head turning and shoulder shrug  were normal and symmetric. Motor: The motor testing reveals 5 over 5 strength of all 4 extremities. Good symmetric motor tone is noted throughout.  Sensory: Sensory testing is intact to soft touch on all 4 extremities. No evidence of extinction is noted.  Coordination: Cerebellar testing reveals good finger-nose-finger and heel-to-shin bilaterally.  Gait and station: In wheelchair    DIAGNOSTIC DATA (LABS, IMAGING, TESTING) - I reviewed patient records, labs, notes, testing and imaging myself where available.   Lab Results  Component Value Date   AJOINOMV67 209 09/04/2015   Lab Results  Component  Value Date   TSH 2.440 09/04/2015      ASSESSMENT AND PLAN 83 y.o. year old female  has a past medical history of Arthritis; Asthma; Depression; Fall; Hyperlipidemia; Hypertension; and Memory loss. here with:  1. Memory Disturbance  Patient has remained stable. Her MMSE today was 23 out of 30. We will continue to monitor her memory. She will remain on Namenda 10 mg twice a day. Daughter states that she may want to retry Aricept in the future however we will hold off at this time. Patient and her daughter advised that if her symptoms worsen or he develops new symptoms she should let us know. She will follow-up in 6 months or sooner if needed.  I spent 15 minutes with the patient. 50% of this time was spent discussing medication     Ward Givens, MSN, NP-C 03/10/2017, 11:45 AM [facility] [address], [place], [place] [address] [phone]

## 2017-03-10 NOTE — Patient Instructions (Signed)
We will continue to monitor memory Continue Namenda We can consider retrying Aricept If your symptoms worsen or you develop new symptoms please let us know.

## 2017-03-12 DIAGNOSIS — M1711 Unilateral primary osteoarthritis, right knee: Secondary | ICD-10-CM | POA: Diagnosis not present

## 2017-03-15 ENCOUNTER — Ambulatory Visit (INDEPENDENT_AMBULATORY_CARE_PROVIDER_SITE_OTHER): Payer: Medicare Other

## 2017-03-15 DIAGNOSIS — R296 Repeated falls: Secondary | ICD-10-CM | POA: Diagnosis not present

## 2017-03-15 DIAGNOSIS — R278 Other lack of coordination: Secondary | ICD-10-CM | POA: Diagnosis not present

## 2017-03-15 DIAGNOSIS — G301 Alzheimer's disease with late onset: Secondary | ICD-10-CM | POA: Diagnosis not present

## 2017-03-15 DIAGNOSIS — M25361 Other instability, right knee: Secondary | ICD-10-CM | POA: Diagnosis not present

## 2017-03-15 DIAGNOSIS — M6389 Disorders of muscle in diseases classified elsewhere, multiple sites: Secondary | ICD-10-CM | POA: Diagnosis not present

## 2017-03-15 DIAGNOSIS — R1313 Dysphagia, pharyngeal phase: Secondary | ICD-10-CM | POA: Diagnosis not present

## 2017-03-15 DIAGNOSIS — J4540 Moderate persistent asthma, uncomplicated: Secondary | ICD-10-CM | POA: Diagnosis not present

## 2017-03-16 MED ORDER — OMALIZUMAB 150 MG ~~LOC~~ SOLR
150.0000 mg | Freq: Once | SUBCUTANEOUS | Status: AC
  Administered 2017-03-15: 150 mg via SUBCUTANEOUS

## 2017-03-16 NOTE — Progress Notes (Signed)
300mg  (60 units) of Xolair given at injection appt. Charged accordingly. [name]

## 2017-03-17 DIAGNOSIS — R1313 Dysphagia, pharyngeal phase: Secondary | ICD-10-CM | POA: Diagnosis not present

## 2017-03-17 DIAGNOSIS — G301 Alzheimer's disease with late onset: Secondary | ICD-10-CM | POA: Diagnosis not present

## 2017-03-17 DIAGNOSIS — M6389 Disorders of muscle in diseases classified elsewhere, multiple sites: Secondary | ICD-10-CM | POA: Diagnosis not present

## 2017-03-17 DIAGNOSIS — R296 Repeated falls: Secondary | ICD-10-CM | POA: Diagnosis not present

## 2017-03-17 DIAGNOSIS — R278 Other lack of coordination: Secondary | ICD-10-CM | POA: Diagnosis not present

## 2017-03-17 DIAGNOSIS — M25361 Other instability, right knee: Secondary | ICD-10-CM | POA: Diagnosis not present

## 2017-03-18 ENCOUNTER — Telehealth: Payer: Self-pay | Admitting: Pulmonary Disease

## 2017-03-18 NOTE — Telephone Encounter (Signed)
#   vials:4 Ordered date:03/18/17 Shipping Date:03/18/17

## 2017-03-19 DIAGNOSIS — M1711 Unilateral primary osteoarthritis, right knee: Secondary | ICD-10-CM | POA: Diagnosis not present

## 2017-03-19 NOTE — Telephone Encounter (Signed)
#   Vials:4 Arrival Date:03/19/17 Lot #[id number] Exp Date:07/2020

## 2017-03-23 DIAGNOSIS — R296 Repeated falls: Secondary | ICD-10-CM | POA: Diagnosis not present

## 2017-03-23 DIAGNOSIS — M6389 Disorders of muscle in diseases classified elsewhere, multiple sites: Secondary | ICD-10-CM | POA: Diagnosis not present

## 2017-03-23 DIAGNOSIS — G301 Alzheimer's disease with late onset: Secondary | ICD-10-CM | POA: Diagnosis not present

## 2017-03-23 DIAGNOSIS — M25361 Other instability, right knee: Secondary | ICD-10-CM | POA: Diagnosis not present

## 2017-03-23 DIAGNOSIS — R1313 Dysphagia, pharyngeal phase: Secondary | ICD-10-CM | POA: Diagnosis not present

## 2017-03-23 DIAGNOSIS — R278 Other lack of coordination: Secondary | ICD-10-CM | POA: Diagnosis not present

## 2017-03-25 DIAGNOSIS — R296 Repeated falls: Secondary | ICD-10-CM | POA: Diagnosis not present

## 2017-03-25 DIAGNOSIS — M25361 Other instability, right knee: Secondary | ICD-10-CM | POA: Diagnosis not present

## 2017-03-25 DIAGNOSIS — M6389 Disorders of muscle in diseases classified elsewhere, multiple sites: Secondary | ICD-10-CM | POA: Diagnosis not present

## 2017-03-25 DIAGNOSIS — R278 Other lack of coordination: Secondary | ICD-10-CM | POA: Diagnosis not present

## 2017-03-25 DIAGNOSIS — G301 Alzheimer's disease with late onset: Secondary | ICD-10-CM | POA: Diagnosis not present

## 2017-03-25 DIAGNOSIS — R1313 Dysphagia, pharyngeal phase: Secondary | ICD-10-CM | POA: Diagnosis not present

## 2017-03-26 ENCOUNTER — Ambulatory Visit (INDEPENDENT_AMBULATORY_CARE_PROVIDER_SITE_OTHER)
Admission: RE | Admit: 2017-03-26 | Discharge: 2017-03-26 | Disposition: A | Discharge status: Home / Self Care | Payer: Medicare Other | Source: Ambulatory Visit | Attending: Pulmonary Disease | Admitting: Pulmonary Disease

## 2017-03-26 ENCOUNTER — Encounter: Payer: Self-pay | Admitting: Pulmonary Disease

## 2017-03-26 ENCOUNTER — Ambulatory Visit (INDEPENDENT_AMBULATORY_CARE_PROVIDER_SITE_OTHER): Payer: Medicare Other | Admitting: Pulmonary Disease

## 2017-03-26 VITALS — BP 128/66 | HR 74 | Ht 63.5 in | Wt 137.0 lb

## 2017-03-26 DIAGNOSIS — M1711 Unilateral primary osteoarthritis, right knee: Secondary | ICD-10-CM | POA: Diagnosis not present

## 2017-03-26 DIAGNOSIS — Z23 Encounter for immunization: Secondary | ICD-10-CM

## 2017-03-26 DIAGNOSIS — J189 Pneumonia, unspecified organism: Secondary | ICD-10-CM | POA: Diagnosis not present

## 2017-03-26 DIAGNOSIS — J309 Allergic rhinitis, unspecified: Secondary | ICD-10-CM

## 2017-03-26 DIAGNOSIS — J4540 Moderate persistent asthma, uncomplicated: Secondary | ICD-10-CM

## 2017-03-26 MED ORDER — ALBUTEROL SULFATE (2.5 MG/3ML) 0.083% IN NEBU: 2.5000 mg | INHALATION_SOLUTION | RESPIRATORY_TRACT | 3 refills | Status: DC | PRN

## 2017-03-26 MED ORDER — FORMOTEROL FUMARATE 20 MCG/2ML IN NEBU: 20.0000 ug | INHALATION_SOLUTION | Freq: Two times a day (BID) | RESPIRATORY_TRACT | 6 refills | Status: DC

## 2017-03-26 MED ORDER — PREDNISONE 20 MG PO TABS: 20.0000 mg | ORAL_TABLET | Freq: Every day | ORAL | 0 refills | Status: DC

## 2017-03-26 MED ORDER — BUDESONIDE 0.5 MG/2ML IN SUSP: 0.5000 mg | Freq: Two times a day (BID) | RESPIRATORY_TRACT | 6 refills | Status: DC

## 2017-03-26 NOTE — Patient Instructions (Addendum)
   We are switching from [facility] over to Pulmicort and Perforomist twice daily in your nebulizer.   Remember to remove any dentures or partials you have before you use your Pulmicort. Remember to brush your teeth & tongue after you use it as well as rinse, gargle & spit to keep from getting thrush in your mouth or on your tongue (a white film).   We are going to use Prednisone daily for 4 days to help with your wheezing as well.  We are keeping you on Singulair & Xolair.  Please let me know if you have any new breathing problems or feel things are getting worse.  TESTS ORDERED: 1. CXR PA/LAT TODAY

## 2017-03-26 NOTE — Progress Notes (Signed)
Subjective:    Patient ID: [name], female    DOB: [birth year], 83 y.o.   MRN: [id number]  C.C.:  Follow-up for Moderate, Persistent Asthma & Chronic Allergic Rhinitis.  HPI The patient's daughter is with her today. She reports she was diagnosed with pneumonia on 7/6 & treated since her last visit. She is currently living in a local [facility].   Moderate, persistent asthma: Previously switched from Advair to Breo 100. Also prescribed Singulair and Xolair. She has had somewhat more cough and congestion lately. She has continued on her current medication regimen as managed by her facility. She does have wheezing at times reported by her daughter. She does report some wheezing at night on the phone per her daughter.  She denies any chest pain or pressure. She also know has nebulizer medication to use as needed.   Chronic allergic rhinitis: Prescribed Flonase, Singulair, and Xolair. Patient also previously taking Zyrtec. She has some minimal sinus drainage. No significant chest congestion.    Review of Systems Unable to obtain an accurate review of systems given her memory difficulties.   Allergies  Allergen Reactions  . Azithromycin Other (See Comments)    Nausea, weakness   . Aspirin   . Sulfonamide Derivatives     Current Outpatient Prescriptions on File Prior to Visit  Medication Sig Dispense Refill  . albuterol (PROVENTIL HFA;VENTOLIN HFA) 108 (90 Base) MCG/ACT inhaler Inhale 1-2 puffs into the lungs every 6 (six) hours as needed for wheezing or shortness of breath. 1 Inhaler 6  . Calcium Carbonate-Vitamin D3 (CALCIUM 600-D) 600-400 MG-UNIT TABS Take by mouth daily.    . cetirizine (ZYRTEC) 10 MG tablet Take 10 mg by mouth at bedtime.    . Cholecalciferol (VITAMIN D-3) 1000 units CAPS Take by mouth daily.    . Cyanocobalamin (VITAMIN B-12) 3000 MCG SUBL Place under the tongue daily.    Marland Kitchen dextromethorphan-guaiFENesin (MUCINEX DM) 30-600 MG 12hr tablet Take 1 tablet by  mouth 2 (two) times daily.    . diphenhydramine-acetaminophen (TYLENOL PM) 25-500 MG TABS tablet Take 1 tablet by mouth at bedtime as needed.    . docusate sodium (COLACE) 100 MG capsule Take 100 mg by mouth daily.    Marland Kitchen FLUoxetine (PROZAC) 20 MG capsule Take 40 mg by mouth daily.     . fluticasone (FLONASE) 50 MCG/ACT nasal spray instill 2 sprays into each nostril once daily 16 g 6  . fluticasone furoate-vilanterol (BREO ELLIPTA) 100-25 MCG/INH AEPB Inhale 1 puff into the lungs daily. 60 each 6  . loratadine (CLARITIN) 10 MG tablet Take 10 mg by mouth daily.    . memantine (NAMENDA) 10 MG tablet Take 1 tablet (10 mg total) by mouth 2 (two) times daily. 60 tablet 11  . montelukast (SINGULAIR) 10 MG tablet Take 1 tablet (10 mg total) by mouth daily. 30 tablet 6  . olmesartan (BENICAR) 40 MG tablet Take 40 mg by mouth daily.    Marland Kitchen omeprazole (PRILOSEC) 20 MG capsule take 1 capsule by mouth once daily 30 MINUTES BEFORE A MEAL  0  . predniSONE (DELTASONE) 20 MG tablet Take 40 mg by mouth daily.  0  . promethazine (PHENERGAN) 25 MG tablet Take 25 mg by mouth 3 (three) times daily as needed for nausea or vomiting.    . [DISCONTINUED] rivaroxaban (XARELTO) 10 MG TABS tablet Take 1 tablet (10 mg total) by mouth daily. 12 tablet 0   No current facility-administered medications on file prior to  visit.     Past Medical History:  Diagnosis Date  . Arthritis   . Asthma   . Depression   . Fall   . Hyperlipidemia   . Hypertension   . Memory loss     Past Surgical History:  Procedure Laterality Date  . HIP ARTHROPLASTY  06/25/2011   Procedure: ARTHROPLASTY BIPOLAR HIP;  Surgeon: Laurice Record Aplington;  Location: [facility];  Service: Orthopedics;  Laterality: Right;  Marland Kitchen VAGINAL HYSTERECTOMY      Family History  Problem Relation Age of Onset  . Asthma Unknown   . Colon cancer Father   . Dementia Neg Hx     Social History   Social History  . Marital status: Married    Spouse name: N/A  . Number of  children: 1  . Years of education: 30   Occupational History  . retired Retired   Social History Main Topics  . Smoking status: Never Smoker  . Smokeless tobacco: Never Used  . Alcohol use 0.0 oz/week     Comment: 1 glass daily  . Drug use: No  . Sexual activity: Not Asked   Other Topics Concern  . None   Social History Narrative   Originally from [place]. She previously lived in [place]. 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 No bird exposure. No recent mold exposure. Previous hold did have mold.       Lives at [facility] community: assistive living   Caffeine use: 2 cups coffee /day      Objective:   Physical Exam BP 128/66 (BP Location: Right Arm, Cuff Size: Normal)   Pulse 74   Ht 5' 3.5" (1.613 m)   Wt 137 lb (62.1 kg)   SpO2 100%   BMI 23.89 kg/m   General:  Elderly female. Sitting in wheelchair. Accompanied by daughter today. Integument:  Warm. Dry. No rash on exposed skin. Lymphatics: No appreciated cervical or supraclavicular lymphadenopathy. HEENT:  Moist mucus membranes. No nasal turbinate swelling. No oral ulcers. Cardiovascular:  Regular rate. No edema. Unable to appreciate JVD. Pulmonary:  Mild bilateral expiratory wheeze. Good aeration bilaterally. Normal work of breathing on room air. Abdomen: Soft. Normal bowel sounds. Mildly protuberant. Musculoskeletal:  Normal bulk and tone. No joint effusion appreciated.  PFT 03/29/06: FVC 2.20 L (93%) FEV1 1.45 L (91%) FEV1/FVC 0.66 FEF 25-75 0.74 L (40%) negative bronchodilator response TLC 3.80 L (88%) RV 84% ERV 42% DLCO uncorrected 83%  LABS 10/10/8 IgE:  490.7    Assessment & Plan:  83 y.o. female with moderate, persistent asthma and chronic allergic rhinitis. Patient's asthma is suboptimally controlled with her wheezing on physical exam today. Given she is now residing in a memory care unit and has more assistance with her routine medications I believe transitioning to a nebulizer regimen may be  more effective and beneficial for the patient. As such, I am transitioning from inhalers to nebulizer medications while continuing her other medications for control of her allergies and asthma. I instructed the patient and her daughter to contact me if there was any question about her breathing or new breathing problems before her next appointment.  1. Moderate, persistent asthma: Switching from Breo to Pulmicort 0.5 mg and Perforomist nebulized twice daily. Also prescribing albuterol to use as needed in her nebulizer for any coughing, wheezing, or shortness of breath. Prescribing the patient prednisone 20 mg daily for next 4 days given her wheezing. Continuing Xolair and Singulair for now. It may be reasonable to consider  discontinuing Xolair in the future. 2. Chronic allergic rhinitis: Continuing Flonase, Xolair and Singulair. No changes at this time. 3. History of pneumonia: Checking chest x-ray PA/LAT today. 4. Health maintenance: Status post Prevnar August 2015 & Pneumovax November 2010. Administering influenza vaccine today. 5. Follow-up: Return to clinic in 3 months or sooner if needed.  Sonia Baller Ashok Cordia, M.D. [facility] Pulmonary & Critical Care Pager:  [phone] After 3pm or if no response, call [phone] 11:43 AM 03/26/17

## 2017-03-29 ENCOUNTER — Ambulatory Visit (INDEPENDENT_AMBULATORY_CARE_PROVIDER_SITE_OTHER): Payer: Medicare Other

## 2017-03-29 DIAGNOSIS — J4540 Moderate persistent asthma, uncomplicated: Secondary | ICD-10-CM | POA: Diagnosis not present

## 2017-03-30 DIAGNOSIS — R1313 Dysphagia, pharyngeal phase: Secondary | ICD-10-CM | POA: Diagnosis not present

## 2017-03-30 DIAGNOSIS — G301 Alzheimer's disease with late onset: Secondary | ICD-10-CM | POA: Diagnosis not present

## 2017-03-30 DIAGNOSIS — R278 Other lack of coordination: Secondary | ICD-10-CM | POA: Diagnosis not present

## 2017-03-30 DIAGNOSIS — M6389 Disorders of muscle in diseases classified elsewhere, multiple sites: Secondary | ICD-10-CM | POA: Diagnosis not present

## 2017-03-30 DIAGNOSIS — R296 Repeated falls: Secondary | ICD-10-CM | POA: Diagnosis not present

## 2017-03-30 DIAGNOSIS — M25361 Other instability, right knee: Secondary | ICD-10-CM | POA: Diagnosis not present

## 2017-03-30 MED ORDER — OMALIZUMAB 150 MG ~~LOC~~ SOLR
300.0000 mg | SUBCUTANEOUS | Status: DC
  Administered 2017-03-29: 300 mg via SUBCUTANEOUS

## 2017-03-30 NOTE — Progress Notes (Signed)
Xolair injection documentation and charges entered by Ashley Caulfield, RMA, based on injection sheet filled out by Tammy Scott per office protocol.   

## 2017-03-31 ENCOUNTER — Other Ambulatory Visit: Payer: Self-pay | Admitting: Pulmonary Disease

## 2017-03-31 DIAGNOSIS — J4540 Moderate persistent asthma, uncomplicated: Secondary | ICD-10-CM

## 2017-03-31 DIAGNOSIS — J189 Pneumonia, unspecified organism: Secondary | ICD-10-CM

## 2017-04-01 DIAGNOSIS — R296 Repeated falls: Secondary | ICD-10-CM | POA: Diagnosis not present

## 2017-04-01 DIAGNOSIS — R1313 Dysphagia, pharyngeal phase: Secondary | ICD-10-CM | POA: Diagnosis not present

## 2017-04-01 DIAGNOSIS — M25361 Other instability, right knee: Secondary | ICD-10-CM | POA: Diagnosis not present

## 2017-04-01 DIAGNOSIS — M6389 Disorders of muscle in diseases classified elsewhere, multiple sites: Secondary | ICD-10-CM | POA: Diagnosis not present

## 2017-04-01 DIAGNOSIS — G301 Alzheimer's disease with late onset: Secondary | ICD-10-CM | POA: Diagnosis not present

## 2017-04-01 DIAGNOSIS — R278 Other lack of coordination: Secondary | ICD-10-CM | POA: Diagnosis not present

## 2017-04-06 DIAGNOSIS — R296 Repeated falls: Secondary | ICD-10-CM | POA: Diagnosis not present

## 2017-04-06 DIAGNOSIS — M25361 Other instability, right knee: Secondary | ICD-10-CM | POA: Diagnosis not present

## 2017-04-06 DIAGNOSIS — M6389 Disorders of muscle in diseases classified elsewhere, multiple sites: Secondary | ICD-10-CM | POA: Diagnosis not present

## 2017-04-06 DIAGNOSIS — R278 Other lack of coordination: Secondary | ICD-10-CM | POA: Diagnosis not present

## 2017-04-08 DIAGNOSIS — R296 Repeated falls: Secondary | ICD-10-CM | POA: Diagnosis not present

## 2017-04-08 DIAGNOSIS — R278 Other lack of coordination: Secondary | ICD-10-CM | POA: Diagnosis not present

## 2017-04-08 DIAGNOSIS — M6389 Disorders of muscle in diseases classified elsewhere, multiple sites: Secondary | ICD-10-CM | POA: Diagnosis not present

## 2017-04-08 DIAGNOSIS — M25361 Other instability, right knee: Secondary | ICD-10-CM | POA: Diagnosis not present

## 2017-04-10 NOTE — Progress Notes (Signed)
Personally  participated in, made any corrections needed, and agree with history, physical, neuro exam,assessment and plan as stated.     Rukaya Kleinschmidt, MD [facility]     

## 2017-04-12 ENCOUNTER — Telehealth: Payer: Self-pay | Admitting: Pulmonary Disease

## 2017-04-12 ENCOUNTER — Ambulatory Visit (INDEPENDENT_AMBULATORY_CARE_PROVIDER_SITE_OTHER): Payer: Medicare Other

## 2017-04-12 DIAGNOSIS — J4540 Moderate persistent asthma, uncomplicated: Secondary | ICD-10-CM

## 2017-04-12 NOTE — Telephone Encounter (Signed)
#   vials:4 Ordered date:04/12/17 Shipping Date:04/12/17

## 2017-04-13 DIAGNOSIS — M25361 Other instability, right knee: Secondary | ICD-10-CM | POA: Diagnosis not present

## 2017-04-13 DIAGNOSIS — R278 Other lack of coordination: Secondary | ICD-10-CM | POA: Diagnosis not present

## 2017-04-13 DIAGNOSIS — R296 Repeated falls: Secondary | ICD-10-CM | POA: Diagnosis not present

## 2017-04-13 DIAGNOSIS — M6389 Disorders of muscle in diseases classified elsewhere, multiple sites: Secondary | ICD-10-CM | POA: Diagnosis not present

## 2017-04-13 MED ORDER — OMALIZUMAB 150 MG ~~LOC~~ SOLR
300.0000 mg | SUBCUTANEOUS | Status: DC
  Administered 2017-04-12: 300 mg via SUBCUTANEOUS

## 2017-04-13 NOTE — Telephone Encounter (Signed)
#   Vials:4 Arrival Date:04/13/17 Lot #:[id number] [id number]:03/[id number]

## 2017-04-13 NOTE — Progress Notes (Signed)
Xolair injection documentation and charges entered by Ashley Caulfield, RMA, based on injection sheet filled out by Tammy Scott per office protocol.   

## 2017-04-14 ENCOUNTER — Encounter: Payer: Self-pay | Admitting: Pulmonary Disease

## 2017-04-14 DIAGNOSIS — J4540 Moderate persistent asthma, uncomplicated: Secondary | ICD-10-CM | POA: Diagnosis not present

## 2017-04-14 DIAGNOSIS — K219 Gastro-esophageal reflux disease without esophagitis: Secondary | ICD-10-CM | POA: Diagnosis not present

## 2017-04-14 DIAGNOSIS — E871 Hypo-osmolality and hyponatremia: Secondary | ICD-10-CM | POA: Diagnosis not present

## 2017-04-14 DIAGNOSIS — M1990 Unspecified osteoarthritis, unspecified site: Secondary | ICD-10-CM | POA: Diagnosis not present

## 2017-04-14 DIAGNOSIS — D519 Vitamin B12 deficiency anemia, unspecified: Secondary | ICD-10-CM | POA: Diagnosis not present

## 2017-04-14 DIAGNOSIS — I129 Hypertensive chronic kidney disease with stage 1 through stage 4 chronic kidney disease, or unspecified chronic kidney disease: Secondary | ICD-10-CM | POA: Diagnosis not present

## 2017-04-14 DIAGNOSIS — E559 Vitamin D deficiency, unspecified: Secondary | ICD-10-CM | POA: Diagnosis not present

## 2017-04-14 DIAGNOSIS — N183 Chronic kidney disease, stage 3 (moderate): Secondary | ICD-10-CM | POA: Diagnosis not present

## 2017-04-14 DIAGNOSIS — J302 Other seasonal allergic rhinitis: Secondary | ICD-10-CM | POA: Diagnosis not present

## 2017-04-14 DIAGNOSIS — Z6825 Body mass index (BMI) 25.0-25.9, adult: Secondary | ICD-10-CM | POA: Diagnosis not present

## 2017-04-14 DIAGNOSIS — R413 Other amnesia: Secondary | ICD-10-CM | POA: Diagnosis not present

## 2017-04-14 DIAGNOSIS — E538 Deficiency of other specified B group vitamins: Secondary | ICD-10-CM | POA: Diagnosis not present

## 2017-04-14 DIAGNOSIS — F331 Major depressive disorder, recurrent, moderate: Secondary | ICD-10-CM | POA: Diagnosis not present

## 2017-04-15 DIAGNOSIS — M25361 Other instability, right knee: Secondary | ICD-10-CM | POA: Diagnosis not present

## 2017-04-15 DIAGNOSIS — R296 Repeated falls: Secondary | ICD-10-CM | POA: Diagnosis not present

## 2017-04-15 DIAGNOSIS — M6389 Disorders of muscle in diseases classified elsewhere, multiple sites: Secondary | ICD-10-CM | POA: Diagnosis not present

## 2017-04-15 DIAGNOSIS — R278 Other lack of coordination: Secondary | ICD-10-CM | POA: Diagnosis not present

## 2017-04-20 DIAGNOSIS — M6389 Disorders of muscle in diseases classified elsewhere, multiple sites: Secondary | ICD-10-CM | POA: Diagnosis not present

## 2017-04-20 DIAGNOSIS — R278 Other lack of coordination: Secondary | ICD-10-CM | POA: Diagnosis not present

## 2017-04-20 DIAGNOSIS — M25361 Other instability, right knee: Secondary | ICD-10-CM | POA: Diagnosis not present

## 2017-04-20 DIAGNOSIS — R296 Repeated falls: Secondary | ICD-10-CM | POA: Diagnosis not present

## 2017-04-22 DIAGNOSIS — R278 Other lack of coordination: Secondary | ICD-10-CM | POA: Diagnosis not present

## 2017-04-22 DIAGNOSIS — R296 Repeated falls: Secondary | ICD-10-CM | POA: Diagnosis not present

## 2017-04-22 DIAGNOSIS — M25361 Other instability, right knee: Secondary | ICD-10-CM | POA: Diagnosis not present

## 2017-04-22 DIAGNOSIS — M6389 Disorders of muscle in diseases classified elsewhere, multiple sites: Secondary | ICD-10-CM | POA: Diagnosis not present

## 2017-04-26 ENCOUNTER — Ambulatory Visit (INDEPENDENT_AMBULATORY_CARE_PROVIDER_SITE_OTHER)
Admission: RE | Admit: 2017-04-26 | Discharge: 2017-04-26 | Disposition: A | Discharge status: Home / Self Care | Payer: Medicare Other | Source: Ambulatory Visit | Attending: Pulmonary Disease | Admitting: Pulmonary Disease

## 2017-04-26 ENCOUNTER — Ambulatory Visit (INDEPENDENT_AMBULATORY_CARE_PROVIDER_SITE_OTHER): Payer: Medicare Other

## 2017-04-26 DIAGNOSIS — J4540 Moderate persistent asthma, uncomplicated: Secondary | ICD-10-CM | POA: Diagnosis not present

## 2017-04-26 DIAGNOSIS — J189 Pneumonia, unspecified organism: Secondary | ICD-10-CM | POA: Diagnosis not present

## 2017-04-26 DIAGNOSIS — R05 Cough: Secondary | ICD-10-CM | POA: Diagnosis not present

## 2017-04-27 DIAGNOSIS — R296 Repeated falls: Secondary | ICD-10-CM | POA: Diagnosis not present

## 2017-04-27 DIAGNOSIS — M6389 Disorders of muscle in diseases classified elsewhere, multiple sites: Secondary | ICD-10-CM | POA: Diagnosis not present

## 2017-04-27 DIAGNOSIS — R278 Other lack of coordination: Secondary | ICD-10-CM | POA: Diagnosis not present

## 2017-04-27 DIAGNOSIS — M25361 Other instability, right knee: Secondary | ICD-10-CM | POA: Diagnosis not present

## 2017-04-28 ENCOUNTER — Telehealth: Payer: Self-pay | Admitting: Pulmonary Disease

## 2017-04-28 MED ORDER — OMALIZUMAB 150 MG ~~LOC~~ SOLR
300.0000 mg | SUBCUTANEOUS | Status: DC
  Administered 2017-04-26: 300 mg via SUBCUTANEOUS

## 2017-04-28 NOTE — Telephone Encounter (Signed)
Notes recorded by Javier Glazier, MD on 04/26/2017 at 1:12 PM EDT Please let the patient know that her left-sided pneumonia has resolved on my review of her x-ray. Thank you. Spoke with patient's daughter. She is aware of results. Nothing else needed at time of call.

## 2017-04-28 NOTE — Progress Notes (Signed)
Documentation of medication administration and charges of Xolair have been completed by Briget Shaheed, CMA based on the Xolair documentation sheet completed by Tammy Scott.  

## 2017-04-29 DIAGNOSIS — M25361 Other instability, right knee: Secondary | ICD-10-CM | POA: Diagnosis not present

## 2017-04-29 DIAGNOSIS — M6389 Disorders of muscle in diseases classified elsewhere, multiple sites: Secondary | ICD-10-CM | POA: Diagnosis not present

## 2017-04-29 DIAGNOSIS — R278 Other lack of coordination: Secondary | ICD-10-CM | POA: Diagnosis not present

## 2017-04-29 DIAGNOSIS — R296 Repeated falls: Secondary | ICD-10-CM | POA: Diagnosis not present

## 2017-05-04 DIAGNOSIS — M6389 Disorders of muscle in diseases classified elsewhere, multiple sites: Secondary | ICD-10-CM | POA: Diagnosis not present

## 2017-05-04 DIAGNOSIS — R296 Repeated falls: Secondary | ICD-10-CM | POA: Diagnosis not present

## 2017-05-04 DIAGNOSIS — R278 Other lack of coordination: Secondary | ICD-10-CM | POA: Diagnosis not present

## 2017-05-04 DIAGNOSIS — M25361 Other instability, right knee: Secondary | ICD-10-CM | POA: Diagnosis not present

## 2017-05-06 DIAGNOSIS — M6389 Disorders of muscle in diseases classified elsewhere, multiple sites: Secondary | ICD-10-CM | POA: Diagnosis not present

## 2017-05-06 DIAGNOSIS — R296 Repeated falls: Secondary | ICD-10-CM | POA: Diagnosis not present

## 2017-05-06 DIAGNOSIS — M25361 Other instability, right knee: Secondary | ICD-10-CM | POA: Diagnosis not present

## 2017-05-06 DIAGNOSIS — R278 Other lack of coordination: Secondary | ICD-10-CM | POA: Diagnosis not present

## 2017-05-10 ENCOUNTER — Ambulatory Visit (INDEPENDENT_AMBULATORY_CARE_PROVIDER_SITE_OTHER): Payer: Medicare Other

## 2017-05-10 DIAGNOSIS — J4540 Moderate persistent asthma, uncomplicated: Secondary | ICD-10-CM

## 2017-05-11 DIAGNOSIS — R278 Other lack of coordination: Secondary | ICD-10-CM | POA: Diagnosis not present

## 2017-05-11 DIAGNOSIS — M25361 Other instability, right knee: Secondary | ICD-10-CM | POA: Diagnosis not present

## 2017-05-11 DIAGNOSIS — M6389 Disorders of muscle in diseases classified elsewhere, multiple sites: Secondary | ICD-10-CM | POA: Diagnosis not present

## 2017-05-11 DIAGNOSIS — R296 Repeated falls: Secondary | ICD-10-CM | POA: Diagnosis not present

## 2017-05-11 MED ORDER — OMALIZUMAB 150 MG ~~LOC~~ SOLR
300.0000 mg | SUBCUTANEOUS | Status: DC
  Administered 2017-05-10: 300 mg via SUBCUTANEOUS

## 2017-05-11 NOTE — Progress Notes (Signed)
Xolair injection documentation and charges entered by Ashley Caulfield, RMA, based on injection sheet filled out by Tammy Scott per office protocol.   

## 2017-05-13 DIAGNOSIS — R278 Other lack of coordination: Secondary | ICD-10-CM | POA: Diagnosis not present

## 2017-05-13 DIAGNOSIS — M25361 Other instability, right knee: Secondary | ICD-10-CM | POA: Diagnosis not present

## 2017-05-13 DIAGNOSIS — R296 Repeated falls: Secondary | ICD-10-CM | POA: Diagnosis not present

## 2017-05-13 DIAGNOSIS — M6389 Disorders of muscle in diseases classified elsewhere, multiple sites: Secondary | ICD-10-CM | POA: Diagnosis not present

## 2017-05-17 ENCOUNTER — Telehealth: Payer: Self-pay | Admitting: Pulmonary Disease

## 2017-05-17 NOTE — Telephone Encounter (Signed)
#   vials:4 Ordered date:05/17/17 Shipping Date:05/17/17

## 2017-05-18 DIAGNOSIS — R296 Repeated falls: Secondary | ICD-10-CM | POA: Diagnosis not present

## 2017-05-18 DIAGNOSIS — M25361 Other instability, right knee: Secondary | ICD-10-CM | POA: Diagnosis not present

## 2017-05-18 DIAGNOSIS — R278 Other lack of coordination: Secondary | ICD-10-CM | POA: Diagnosis not present

## 2017-05-18 DIAGNOSIS — M6389 Disorders of muscle in diseases classified elsewhere, multiple sites: Secondary | ICD-10-CM | POA: Diagnosis not present

## 2017-05-18 NOTE — Telephone Encounter (Signed)
#   Vials: 4 Arrival Date:05/18/17 Lot #:[id number] Exp Date:10/2020

## 2017-05-20 DIAGNOSIS — M6389 Disorders of muscle in diseases classified elsewhere, multiple sites: Secondary | ICD-10-CM | POA: Diagnosis not present

## 2017-05-20 DIAGNOSIS — R278 Other lack of coordination: Secondary | ICD-10-CM | POA: Diagnosis not present

## 2017-05-20 DIAGNOSIS — M25361 Other instability, right knee: Secondary | ICD-10-CM | POA: Diagnosis not present

## 2017-05-20 DIAGNOSIS — R296 Repeated falls: Secondary | ICD-10-CM | POA: Diagnosis not present

## 2017-05-25 ENCOUNTER — Ambulatory Visit (INDEPENDENT_AMBULATORY_CARE_PROVIDER_SITE_OTHER): Payer: Medicare Other

## 2017-05-25 DIAGNOSIS — J4540 Moderate persistent asthma, uncomplicated: Secondary | ICD-10-CM

## 2017-05-26 DIAGNOSIS — M25361 Other instability, right knee: Secondary | ICD-10-CM | POA: Diagnosis not present

## 2017-05-26 DIAGNOSIS — R278 Other lack of coordination: Secondary | ICD-10-CM | POA: Diagnosis not present

## 2017-05-26 DIAGNOSIS — R296 Repeated falls: Secondary | ICD-10-CM | POA: Diagnosis not present

## 2017-05-26 DIAGNOSIS — M6389 Disorders of muscle in diseases classified elsewhere, multiple sites: Secondary | ICD-10-CM | POA: Diagnosis not present

## 2017-05-26 MED ORDER — OMALIZUMAB 150 MG ~~LOC~~ SOLR
300.0000 mg | SUBCUTANEOUS | Status: DC
  Administered 2017-05-25: 300 mg via SUBCUTANEOUS

## 2017-05-26 NOTE — Progress Notes (Signed)
Documentation of medication administration and charges of Xolair have been completed by Lindsay Lemons, CMA based on the Xolair documentation sheet completed by Tammy Scott.  

## 2017-05-27 DIAGNOSIS — M6389 Disorders of muscle in diseases classified elsewhere, multiple sites: Secondary | ICD-10-CM | POA: Diagnosis not present

## 2017-05-27 DIAGNOSIS — R278 Other lack of coordination: Secondary | ICD-10-CM | POA: Diagnosis not present

## 2017-05-27 DIAGNOSIS — R296 Repeated falls: Secondary | ICD-10-CM | POA: Diagnosis not present

## 2017-05-27 DIAGNOSIS — M25361 Other instability, right knee: Secondary | ICD-10-CM | POA: Diagnosis not present

## 2017-05-28 DIAGNOSIS — M25361 Other instability, right knee: Secondary | ICD-10-CM | POA: Diagnosis not present

## 2017-05-28 DIAGNOSIS — R278 Other lack of coordination: Secondary | ICD-10-CM | POA: Diagnosis not present

## 2017-05-28 DIAGNOSIS — R296 Repeated falls: Secondary | ICD-10-CM | POA: Diagnosis not present

## 2017-05-28 DIAGNOSIS — M6389 Disorders of muscle in diseases classified elsewhere, multiple sites: Secondary | ICD-10-CM | POA: Diagnosis not present

## 2017-05-31 DIAGNOSIS — R296 Repeated falls: Secondary | ICD-10-CM | POA: Diagnosis not present

## 2017-05-31 DIAGNOSIS — M25361 Other instability, right knee: Secondary | ICD-10-CM | POA: Diagnosis not present

## 2017-05-31 DIAGNOSIS — M6389 Disorders of muscle in diseases classified elsewhere, multiple sites: Secondary | ICD-10-CM | POA: Diagnosis not present

## 2017-05-31 DIAGNOSIS — R278 Other lack of coordination: Secondary | ICD-10-CM | POA: Diagnosis not present

## 2017-06-03 DIAGNOSIS — R278 Other lack of coordination: Secondary | ICD-10-CM | POA: Diagnosis not present

## 2017-06-03 DIAGNOSIS — M6389 Disorders of muscle in diseases classified elsewhere, multiple sites: Secondary | ICD-10-CM | POA: Diagnosis not present

## 2017-06-03 DIAGNOSIS — M25361 Other instability, right knee: Secondary | ICD-10-CM | POA: Diagnosis not present

## 2017-06-03 DIAGNOSIS — R296 Repeated falls: Secondary | ICD-10-CM | POA: Diagnosis not present

## 2017-06-04 DIAGNOSIS — M6389 Disorders of muscle in diseases classified elsewhere, multiple sites: Secondary | ICD-10-CM | POA: Diagnosis not present

## 2017-06-04 DIAGNOSIS — R296 Repeated falls: Secondary | ICD-10-CM | POA: Diagnosis not present

## 2017-06-04 DIAGNOSIS — M25361 Other instability, right knee: Secondary | ICD-10-CM | POA: Diagnosis not present

## 2017-06-04 DIAGNOSIS — R278 Other lack of coordination: Secondary | ICD-10-CM | POA: Diagnosis not present

## 2017-06-07 DIAGNOSIS — M6389 Disorders of muscle in diseases classified elsewhere, multiple sites: Secondary | ICD-10-CM | POA: Diagnosis not present

## 2017-06-07 DIAGNOSIS — M25361 Other instability, right knee: Secondary | ICD-10-CM | POA: Diagnosis not present

## 2017-06-07 DIAGNOSIS — R278 Other lack of coordination: Secondary | ICD-10-CM | POA: Diagnosis not present

## 2017-06-07 DIAGNOSIS — R296 Repeated falls: Secondary | ICD-10-CM | POA: Diagnosis not present

## 2017-06-08 ENCOUNTER — Ambulatory Visit (INDEPENDENT_AMBULATORY_CARE_PROVIDER_SITE_OTHER): Payer: Medicare Other

## 2017-06-08 DIAGNOSIS — J4540 Moderate persistent asthma, uncomplicated: Secondary | ICD-10-CM | POA: Diagnosis not present

## 2017-06-09 ENCOUNTER — Telehealth: Payer: Self-pay | Admitting: Pulmonary Disease

## 2017-06-09 DIAGNOSIS — M25361 Other instability, right knee: Secondary | ICD-10-CM | POA: Diagnosis not present

## 2017-06-09 DIAGNOSIS — M6389 Disorders of muscle in diseases classified elsewhere, multiple sites: Secondary | ICD-10-CM | POA: Diagnosis not present

## 2017-06-09 DIAGNOSIS — R296 Repeated falls: Secondary | ICD-10-CM | POA: Diagnosis not present

## 2017-06-09 DIAGNOSIS — R278 Other lack of coordination: Secondary | ICD-10-CM | POA: Diagnosis not present

## 2017-06-10 MED ORDER — OMALIZUMAB 150 MG ~~LOC~~ SOLR
300.0000 mg | SUBCUTANEOUS | Status: DC
  Administered 2017-06-08: 300 mg via SUBCUTANEOUS

## 2017-06-10 NOTE — Progress Notes (Signed)
Documentation of medication administration and charges of Xolair have been completed by Collie Wernick, CMA based on the Xolair documentation sheet completed by Tammy Scott.  

## 2017-06-11 NOTE — Telephone Encounter (Signed)
Will forward to TS to follow up on   Thanks

## 2017-06-14 DIAGNOSIS — R296 Repeated falls: Secondary | ICD-10-CM | POA: Diagnosis not present

## 2017-06-14 DIAGNOSIS — R278 Other lack of coordination: Secondary | ICD-10-CM | POA: Diagnosis not present

## 2017-06-14 DIAGNOSIS — M6389 Disorders of muscle in diseases classified elsewhere, multiple sites: Secondary | ICD-10-CM | POA: Diagnosis not present

## 2017-06-14 DIAGNOSIS — M25361 Other instability, right knee: Secondary | ICD-10-CM | POA: Diagnosis not present

## 2017-06-15 ENCOUNTER — Telehealth: Payer: Self-pay | Admitting: Pulmonary Disease

## 2017-06-15 NOTE — Telephone Encounter (Signed)
#   vials:4 Ordered date:06/15/17 Shipping Date:06/15/17

## 2017-06-16 NOTE — Telephone Encounter (Signed)
#   Vials:4 Arrival Date:06/16/17 Lot #:[id number] Exp Date:10/2020

## 2017-06-21 DIAGNOSIS — R296 Repeated falls: Secondary | ICD-10-CM | POA: Diagnosis not present

## 2017-06-21 DIAGNOSIS — M25361 Other instability, right knee: Secondary | ICD-10-CM | POA: Diagnosis not present

## 2017-06-21 DIAGNOSIS — R278 Other lack of coordination: Secondary | ICD-10-CM | POA: Diagnosis not present

## 2017-06-21 DIAGNOSIS — M6389 Disorders of muscle in diseases classified elsewhere, multiple sites: Secondary | ICD-10-CM | POA: Diagnosis not present

## 2017-06-22 ENCOUNTER — Ambulatory Visit: Payer: Self-pay

## 2017-06-23 ENCOUNTER — Ambulatory Visit (INDEPENDENT_AMBULATORY_CARE_PROVIDER_SITE_OTHER): Payer: Medicare Other

## 2017-06-23 DIAGNOSIS — J4540 Moderate persistent asthma, uncomplicated: Secondary | ICD-10-CM | POA: Diagnosis not present

## 2017-06-23 NOTE — Telephone Encounter (Signed)
Sent notes with pt. Today. Nothing further needed. Pt. Had another appt. Today. I didn't know about the new policy last time or I would have filled that one out as well.Nothing further needed.

## 2017-06-28 DIAGNOSIS — M25361 Other instability, right knee: Secondary | ICD-10-CM | POA: Diagnosis not present

## 2017-06-28 DIAGNOSIS — R278 Other lack of coordination: Secondary | ICD-10-CM | POA: Diagnosis not present

## 2017-06-28 DIAGNOSIS — M6389 Disorders of muscle in diseases classified elsewhere, multiple sites: Secondary | ICD-10-CM | POA: Diagnosis not present

## 2017-06-28 DIAGNOSIS — R296 Repeated falls: Secondary | ICD-10-CM | POA: Diagnosis not present

## 2017-06-29 MED ORDER — OMALIZUMAB 150 MG ~~LOC~~ SOLR
300.0000 mg | Freq: Once | SUBCUTANEOUS | Status: AC
  Administered 2017-06-23: 300 mg via SUBCUTANEOUS

## 2017-07-01 ENCOUNTER — Encounter: Payer: Self-pay | Admitting: Pulmonary Disease

## 2017-07-01 ENCOUNTER — Ambulatory Visit (INDEPENDENT_AMBULATORY_CARE_PROVIDER_SITE_OTHER): Payer: Medicare Other | Admitting: Pulmonary Disease

## 2017-07-01 VITALS — BP 128/78 | HR 100 | Ht 65.5 in | Wt 142.0 lb

## 2017-07-01 DIAGNOSIS — J4540 Moderate persistent asthma, uncomplicated: Secondary | ICD-10-CM

## 2017-07-01 DIAGNOSIS — J309 Allergic rhinitis, unspecified: Secondary | ICD-10-CM | POA: Diagnosis not present

## 2017-07-01 NOTE — Patient Instructions (Addendum)
   Continue using your nebulizers and medications as prescribed.  We are going to stop your Breo inhaler.  We will plan on seeing you back in 6 months.  Please call if you have any new breathing problems or questions before your next appointment.

## 2017-07-01 NOTE — Progress Notes (Signed)
Subjective:    Patient ID: [name], female    DOB: [birth year], 83 y.o.   MRN: [id number]  C.C.:  Follow-up for Moderate, Persistent Asthma & Chronic Allergic Rhinitis.  HPI Moderate, persistent asthma: Previously has been on Advair as well as Breo. Patient prescribed Singulair and Xolair. Switched over from [place] to [employer] and Pulmicort at last appointment. Also treated with a short course of prednisone. She has continued taking Breo due to confusion at her facility. She reports her breathing is doing good. No new cough or wheezing. No exacerbations since last appointment.   Chronic allergic rhinitis: Prescribed Xolair, Singulair, and Flonase. At last appointment patient was also taking Zyrtec. No sinus congestion or drainage.  Review of Systems No chest pain, pressure or tightness. No fever or chills. No abdominal pain or nausea.   Allergies  Allergen Reactions  . Azithromycin Other (See Comments)    Nausea, weakness   . Aspirin   . Sulfonamide Derivatives     Current Outpatient Medications on File Prior to Visit  Medication Sig Dispense Refill  . albuterol (PROVENTIL HFA;VENTOLIN HFA) 108 (90 Base) MCG/ACT inhaler Inhale 1-2 puffs into the lungs every 6 (six) hours as needed for wheezing or shortness of breath. 1 Inhaler 6  . albuterol (PROVENTIL) (2.5 MG/3ML) 0.083% nebulizer solution Take 3 mLs (2.5 mg total) by nebulization every 4 (four) hours as needed for wheezing or shortness of breath. 120 mL 3  . budesonide (PULMICORT) 0.5 MG/2ML nebulizer solution Take 2 mLs (0.5 mg total) by nebulization 2 (two) times daily. 60 mL 6  . Calcium Carbonate-Vitamin D3 (CALCIUM 600-D) 600-400 MG-UNIT TABS Take by mouth daily.    . cetirizine (ZYRTEC) 10 MG tablet Take 10 mg by mouth at bedtime.    . Cholecalciferol (VITAMIN D-3) 1000 units CAPS Take by mouth daily.    . Cyanocobalamin (VITAMIN B-12) 3000 MCG SUBL Place under the tongue daily.    Marland Kitchen dextromethorphan-guaiFENesin  (MUCINEX DM) 30-600 MG 12hr tablet Take 1 tablet by mouth 2 (two) times daily.    . diphenhydramine-acetaminophen (TYLENOL PM) 25-500 MG TABS tablet Take 1 tablet by mouth at bedtime as needed.    . docusate sodium (COLACE) 100 MG capsule Take 100 mg by mouth daily.    Marland Kitchen FLUoxetine (PROZAC) 20 MG capsule Take 40 mg by mouth daily.     . fluticasone (FLONASE) 50 MCG/ACT nasal spray instill 2 sprays into each nostril once daily 16 g 6  . fluticasone furoate-vilanterol (BREO ELLIPTA) 100-25 MCG/INH AEPB Inhale 1 puff into the lungs daily. 60 each 6  . formoterol (PERFOROMIST) 20 MCG/2ML nebulizer solution Take 2 mLs (20 mcg total) by nebulization 2 (two) times daily. 60 mL 6  . loratadine (CLARITIN) 10 MG tablet Take 10 mg by mouth daily.    . memantine (NAMENDA) 10 MG tablet Take 1 tablet (10 mg total) by mouth 2 (two) times daily. 60 tablet 11  . montelukast (SINGULAIR) 10 MG tablet Take 1 tablet (10 mg total) by mouth daily. 30 tablet 6  . olmesartan (BENICAR) 40 MG tablet Take 40 mg by mouth daily.    Marland Kitchen omeprazole (PRILOSEC) 20 MG capsule take 1 capsule by mouth once daily 30 MINUTES BEFORE A MEAL  0  . predniSONE (DELTASONE) 20 MG tablet Take 40 mg by mouth daily.  0  . predniSONE (DELTASONE) 20 MG tablet Take 1 tablet (20 mg total) by mouth daily with breakfast. 4 tablet 0  . promethazine (PHENERGAN) 25  MG tablet Take 25 mg by mouth 3 (three) times daily as needed for nausea or vomiting.    . [DISCONTINUED] rivaroxaban (XARELTO) 10 MG TABS tablet Take 1 tablet (10 mg total) by mouth daily. 12 tablet 0   Current Facility-Administered Medications on File Prior to Visit  Medication Dose Route Frequency Provider Last Rate Last Dose  . omalizumab Arvid Right) injection 300 mg  300 mg Subcutaneous Q14 Days Javier Glazier, MD   300 mg at 03/29/17 1636  . omalizumab Arvid Right) injection 300 mg  300 mg Subcutaneous Q14 Days Javier Glazier, MD   300 mg at 04/12/17 0916  . omalizumab Arvid Right)  injection 300 mg  300 mg Subcutaneous Q14 Days Javier Glazier, MD   300 mg at 04/26/17 1043  . omalizumab Arvid Right) injection 300 mg  300 mg Subcutaneous Q14 Days Javier Glazier, MD   300 mg at 05/10/17 0951  . omalizumab Arvid Right) injection 300 mg  300 mg Subcutaneous Q14 Days Javier Glazier, MD   300 mg at 05/25/17 1054  . omalizumab Arvid Right) injection 300 mg  300 mg Subcutaneous Q14 Days Javier Glazier, MD   300 mg at 06/08/17 1035    Past Medical History:  Diagnosis Date  . Arthritis   . Asthma   . Depression   . Fall   . Hyperlipidemia   . Hypertension   . Memory loss     Past Surgical History:  Procedure Laterality Date  . HIP ARTHROPLASTY  06/25/2011   Procedure: ARTHROPLASTY BIPOLAR HIP;  Surgeon: Laurice Record Aplington;  Location: [facility];  Service: Orthopedics;  Laterality: Right;  Marland Kitchen VAGINAL HYSTERECTOMY      Family History  Problem Relation Age of Onset  . Asthma Unknown   . Colon cancer Father   . Dementia Neg Hx     Social History   Socioeconomic History  . Marital status: Married    Spouse name: None  . Number of children: 1  . Years of education: 81  . Highest education level: None  Social Needs  . Financial resource strain: None  . Food insecurity - worry: None  . Food insecurity - inability: None  . Transportation needs - medical: None  . Transportation needs - non-medical: None  Occupational History  . Occupation: retired    Fish farm manager: RETIRED  Tobacco Use  . Smoking status: Never Smoker  . Smokeless tobacco: Never Used  Substance and Sexual Activity  . Alcohol use: Yes    Alcohol/week: 0.0 oz    Comment: 1 glass daily  . Drug use: No  . Sexual activity: None  Other Topics Concern  . None  Social History Narrative   Originally from [place]. She previously lived in [place]. 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 No bird exposure. No recent mold exposure. Previous hold did have mold.       Lives at [facility] retirement community:  assistive living   Caffeine use: 2 cups coffee /day      Objective:   Physical Exam BP 128/78 (BP Location: Left Arm, Cuff Size: Normal)   Pulse 100   Ht 5' 5.5" (1.664 m)   Wt 142 lb (64.4 kg)   SpO2 94%   BMI 23.27 kg/m   General:  Awake. No distress. Sitting in wheelchair.  Integument:  No rash. Warm. Dry. Extremities:  No cyanosis or clubbing.  HEENT:  No nasal turbinate swelling. No scleral icterus. Moist mucous membranes Cardiovascular:  Regular rate. No edema. Unable  to appreciate JVD.  Pulmonary:  Slightly diminished breath sounds in the bases. Otherwise clear to auscultation bilaterally. Normal work of breathing. Abdomen: Soft. Normal bowel sounds. Nondistended.  Musculoskeletal:  Normal bulk and tone. No joint deformity or effusion appreciated. Neurological:  Cranial nerves 2-12 grossly in tact. No meningismus.   PFT 03/29/06: FVC 2.20 L (93%) FEV1 1.45 L (91%) FEV1/FVC 0.66 FEF 25-75 0.74 L (40%) negative bronchodilator response TLC 3.80 L (88%) RV 84% ERV 42% DLCO uncorrected 83%  IMAGING CXR PA/LAT 04/26/17 (personally reviewed by me):  No opacity or mass appreciated. No pleural effusion. Heart normal in size & mediastinum normal in contour.  LABS 10/10/8 IgE:  490.7    Assessment & Plan:  83 y.o. female with underlying moderate, persistent asthma. Patient is doing better on her current regimen of Pulmicort and Perforomist. This nebulizer regimen seems to be more effective than her previous inhalers. Reviewing her previous x-ray shows no residual capacity. Overall her allergies are well controlled as well. She seems to have benefited significantly from her current nebulizers and Xolair. I instructed the patient to contact our office if she had any new breathing problems otherwise we will plan on seeing her back in 6 months. She will discuss with her daughter which physician she will continue to follow with after my departure.  1. Moderate, persistent asthma: Patient  continuing on her current regimen of Pulmicort, Perforomist, Singulair, and Xolair. No changes. Discontinuing Breo from her regimen. 2. Chronic allergic rhinitis: Continuing Singulair and Xolair. Patient also continuing on Flonase. No new medications at this time. 3. Health maintenance: Status post Flu Vaccine August 2018, Prevnar August 2015 & Pneumovax November 2010. 4. Follow-up: Return to clinic in 6 months or sooner if needed.  Sonia Baller Ashok Cordia, M.D. [facility] Pulmonary & Critical Care Pager:  [phone] After 3pm or if no response, call [phone] 11:47 AM 07/01/17

## 2017-07-07 ENCOUNTER — Ambulatory Visit (INDEPENDENT_AMBULATORY_CARE_PROVIDER_SITE_OTHER): Payer: Medicare Other

## 2017-07-07 DIAGNOSIS — J4540 Moderate persistent asthma, uncomplicated: Secondary | ICD-10-CM | POA: Diagnosis not present

## 2017-07-08 MED ORDER — OMALIZUMAB 150 MG ~~LOC~~ SOLR
300.0000 mg | Freq: Once | SUBCUTANEOUS | Status: AC
  Administered 2017-07-07: 300 mg via SUBCUTANEOUS

## 2017-07-16 ENCOUNTER — Encounter: Payer: Self-pay | Admitting: Pulmonary Disease

## 2017-07-16 DIAGNOSIS — R062 Wheezing: Secondary | ICD-10-CM | POA: Diagnosis not present

## 2017-07-20 ENCOUNTER — Telehealth: Payer: Self-pay | Admitting: Pulmonary Disease

## 2017-07-21 ENCOUNTER — Telehealth: Payer: Self-pay | Admitting: Pulmonary Disease

## 2017-07-21 ENCOUNTER — Ambulatory Visit (INDEPENDENT_AMBULATORY_CARE_PROVIDER_SITE_OTHER): Payer: Medicare Other

## 2017-07-21 DIAGNOSIS — J4540 Moderate persistent asthma, uncomplicated: Secondary | ICD-10-CM | POA: Diagnosis not present

## 2017-07-22 NOTE — Telephone Encounter (Signed)
#   vials:4 Ordered date:07/20/17 Shipping Date:07/20/17 I was off, [name] ordered this late in the day, and there was an emergency staff meeting.

## 2017-07-22 NOTE — Telephone Encounter (Deleted)
#   vials:4 Ordered date:07/20/17 Shipping Date:07/14/17 I was off, the order was placed late in the day. (emergency staff meeting.)

## 2017-07-22 NOTE — Telephone Encounter (Signed)
#   Vials:4 Arrival Date:07/21/17 Lot #:[id number] Exp Date:11/2020 order wasn't put in.

## 2017-07-22 NOTE — Telephone Encounter (Signed)
error 

## 2017-07-24 ENCOUNTER — Encounter (HOSPITAL_COMMUNITY): Payer: Self-pay | Admitting: Emergency Medicine

## 2017-07-24 ENCOUNTER — Emergency Department (HOSPITAL_COMMUNITY): Payer: Medicare Other

## 2017-07-24 ENCOUNTER — Emergency Department (HOSPITAL_COMMUNITY)
Admission: EM | Admit: 2017-07-24 | Discharge: 2017-07-25 | Disposition: A | Discharge status: Home / Self Care | Payer: Medicare Other | Attending: Emergency Medicine | Admitting: Emergency Medicine

## 2017-07-24 DIAGNOSIS — S199XXA Unspecified injury of neck, initial encounter: Secondary | ICD-10-CM | POA: Diagnosis not present

## 2017-07-24 DIAGNOSIS — S064X0A Epidural hemorrhage without loss of consciousness, initial encounter: Secondary | ICD-10-CM | POA: Diagnosis not present

## 2017-07-24 DIAGNOSIS — Z96641 Presence of right artificial hip joint: Secondary | ICD-10-CM | POA: Insufficient documentation

## 2017-07-24 DIAGNOSIS — Y929 Unspecified place or not applicable: Secondary | ICD-10-CM | POA: Diagnosis not present

## 2017-07-24 DIAGNOSIS — R9431 Abnormal electrocardiogram [ECG] [EKG]: Secondary | ICD-10-CM | POA: Diagnosis not present

## 2017-07-24 DIAGNOSIS — Y939 Activity, unspecified: Secondary | ICD-10-CM | POA: Diagnosis not present

## 2017-07-24 DIAGNOSIS — S0181XA Laceration without foreign body of other part of head, initial encounter: Secondary | ICD-10-CM | POA: Insufficient documentation

## 2017-07-24 DIAGNOSIS — S299XXA Unspecified injury of thorax, initial encounter: Secondary | ICD-10-CM | POA: Diagnosis not present

## 2017-07-24 DIAGNOSIS — J45909 Unspecified asthma, uncomplicated: Secondary | ICD-10-CM | POA: Insufficient documentation

## 2017-07-24 DIAGNOSIS — Y999 Unspecified external cause status: Secondary | ICD-10-CM | POA: Insufficient documentation

## 2017-07-24 DIAGNOSIS — S51011A Laceration without foreign body of right elbow, initial encounter: Secondary | ICD-10-CM | POA: Diagnosis not present

## 2017-07-24 DIAGNOSIS — S12090A Other displaced fracture of first cervical vertebra, initial encounter for closed fracture: Secondary | ICD-10-CM

## 2017-07-24 DIAGNOSIS — I10 Essential (primary) hypertension: Secondary | ICD-10-CM | POA: Insufficient documentation

## 2017-07-24 DIAGNOSIS — R51 Headache: Secondary | ICD-10-CM | POA: Insufficient documentation

## 2017-07-24 DIAGNOSIS — S12000A Unspecified displaced fracture of first cervical vertebra, initial encounter for closed fracture: Secondary | ICD-10-CM | POA: Diagnosis not present

## 2017-07-24 DIAGNOSIS — S0990XA Unspecified injury of head, initial encounter: Secondary | ICD-10-CM | POA: Diagnosis not present

## 2017-07-24 DIAGNOSIS — W19XXXA Unspecified fall, initial encounter: Secondary | ICD-10-CM | POA: Insufficient documentation

## 2017-07-24 LAB — CBC WITH DIFFERENTIAL/PLATELET
BASOS ABS: 0 10*3/uL (ref 0.0–0.1)
BASOS PCT: 0 %
EOS PCT: 6 %
Eosinophils Absolute: 0.6 10*3/uL (ref 0.0–0.7)
HCT: 34.9 % — ABNORMAL LOW (ref 36.0–46.0)
Hemoglobin: 11.4 g/dL — ABNORMAL LOW (ref 12.0–15.0)
Lymphocytes Relative: 15 %
Lymphs Abs: 1.8 10*3/uL (ref 0.7–4.0)
MCH: 30.3 pg (ref 26.0–34.0)
MCHC: 32.7 g/dL (ref 30.0–36.0)
MCV: 92.8 fL (ref 78.0–100.0)
MONO ABS: 0.5 10*3/uL (ref 0.1–1.0)
MONOS PCT: 5 %
Neutro Abs: 8.7 10*3/uL — ABNORMAL HIGH (ref 1.7–7.7)
Neutrophils Relative %: 74 %
PLATELETS: 304 10*3/uL (ref 150–400)
RBC: 3.76 MIL/uL — ABNORMAL LOW (ref 3.87–5.11)
RDW: 14.5 % (ref 11.5–15.5)
WBC: 11.7 10*3/uL — ABNORMAL HIGH (ref 4.0–10.5)

## 2017-07-24 LAB — BASIC METABOLIC PANEL
Anion gap: 13 (ref 5–15)
BUN: 15 mg/dL (ref 6–20)
CHLORIDE: 95 mmol/L — AB (ref 101–111)
CO2: 24 mmol/L (ref 22–32)
CREATININE: 0.84 mg/dL (ref 0.44–1.00)
Calcium: 9 mg/dL (ref 8.9–10.3)
GFR calc Af Amer: >60 mL/min (ref >60)
GFR calc non Af Amer: 59 mL/min — ABNORMAL LOW (ref >60)
GLUCOSE: 142 mg/dL — AB (ref 65–99)
Potassium: 3.3 mmol/L — ABNORMAL LOW (ref 3.5–5.1)
SODIUM: 132 mmol/L — AB (ref 135–145)

## 2017-07-24 LAB — PROTIME-INR
INR: 0.95
Prothrombin Time: 12.6 seconds (ref 11.4–15.2)

## 2017-07-24 LAB — MAGNESIUM: MAGNESIUM: 2.1 mg/dL (ref 1.7–2.4)

## 2017-07-24 MED ORDER — LIDOCAINE-EPINEPHRINE-TETRACAINE (LET) SOLUTION
3.0000 mL | Freq: Once | NASAL | Status: AC
  Administered 2017-07-24: 3 mL via TOPICAL
  Filled 2017-07-24: qty 3

## 2017-07-24 MED ORDER — TETANUS-DIPHTH-ACELL PERTUSSIS 5-2.5-18.5 LF-MCG/0.5 IM SUSP
0.5000 mL | Freq: Once | INTRAMUSCULAR | Status: DC
  Filled 2017-07-24: qty 0.5

## 2017-07-24 MED ORDER — LIDOCAINE-EPINEPHRINE (PF) 2 %-1:200000 IJ SOLN
10.0000 mL | Freq: Once | INTRAMUSCULAR | Status: AC
  Administered 2017-07-24: 10 mL via INTRADERMAL
  Filled 2017-07-24: qty 20

## 2017-07-24 MED ORDER — ACETAMINOPHEN 325 MG PO TABS
650.0000 mg | ORAL_TABLET | Freq: Once | ORAL | Status: AC
  Administered 2017-07-24: 650 mg via ORAL
  Filled 2017-07-24: qty 2

## 2017-07-24 NOTE — ED Notes (Signed)
Secretary called [facility] for transport home. Pt to be moved out into hallway until their arrival. Pt daughter at bedside and d/c instruction reviewed with her as well as Rollene Fare, [place], at [place].

## 2017-07-24 NOTE — Discharge Instructions (Signed)
Your workup today showed evidence of lacerations and skin tears.  We also found the cervical spine fracture.  Please follow-up with neurosurgery for further evaluation and management.  We suspect is an old injury.  Please watch for signs and symptoms of infection.  If any symptoms change or worsen, please return to the nearest emergency department.

## 2017-07-24 NOTE — ED Triage Notes (Addendum)
Pt BIB EMS from [facility] for a fall. Pt fell forward and hit head; hematoma to head; denies LOC, states she does not know how she fell. Skin tear to rt arm. Denies blood thinners. Pt wheezing with EMS, received 10 mg albuterol, 0.5 mg atrovent, and 125 mg solumedrol PTA. Pt still wheezing on ED assessment, finishing EMS neb tx. Pt A&Ox4; resp e/u; nad at this time.   Pt in afib with ems; denies hx of afib.

## 2017-07-24 NOTE — ED Notes (Signed)
Family at bedside. 

## 2017-07-24 NOTE — ED Notes (Signed)
ED Provider at bedside. 

## 2017-07-24 NOTE — ED Notes (Signed)
Patient transported to X-ray 

## 2017-07-24 NOTE — ED Notes (Signed)
Pt head lac cleansed and hair rinsed out. Rt arm skin tear cleansed.

## 2017-07-24 NOTE — ED Provider Notes (Signed)
[facility] EMERGENCY DEPARTMENT Provider Note   CSN: [id number] Arrival date & time: 07/24/17  1728     History   Chief Complaint Chief Complaint  Patient presents with  . Fall    HPI [name] is a 83 y.o. female.  The history is provided by the patient. No language interpreter was used.  Fall  This is a new problem. The current episode started less than 1 hour ago. The problem occurs constantly. The problem has not changed since onset.Associated symptoms include headaches. Pertinent negatives include no chest pain, no abdominal pain and no shortness of breath. Nothing aggravates the symptoms. Nothing relieves the symptoms. She has tried nothing for the symptoms. The treatment provided no relief.    Past Medical History:  Diagnosis Date  . Arthritis   . Asthma   . Depression   . Fall   . Hyperlipidemia   . Hypertension   . Memory loss     Patient Active Problem List   Diagnosis Date Noted  . Generalized weakness 05/26/2016  . Dementia 03/06/2016  . Memory loss 09/04/2015  . Fatigue 09/04/2015  . At high risk for falls 12/07/2012  . COLONIC POLYPS, BENIGN, HX OF 12/14/2008  . Moderate persistent asthma 10/27/2007  . HYPERTENSION 05/13/2007  . Allergic rhinitis 05/13/2007    Past Surgical History:  Procedure Laterality Date  . HIP ARTHROPLASTY  06/25/2011   Procedure: ARTHROPLASTY BIPOLAR HIP;  Surgeon: Laurice Record [place];  Location: [facility];  Service: Orthopedics;  Laterality: Right;  Marland Kitchen VAGINAL HYSTERECTOMY      OB History    No data available       Home Medications    Prior to Admission medications   Medication Sig Start Date End Date Taking? Authorizing Provider  albuterol (PROVENTIL HFA;VENTOLIN HFA) 108 (90 Base) MCG/ACT inhaler Inhale 1-2 puffs into the lungs every 6 (six) hours as needed for wheezing or shortness of breath. 09/11/16   Javier Glazier, MD  albuterol (PROVENTIL) (2.5 MG/3ML) 0.083% nebulizer solution Take  3 mLs (2.5 mg total) by nebulization every 4 (four) hours as needed for wheezing or shortness of breath. 03/26/17   Javier Glazier, MD  budesonide (PULMICORT) 0.5 MG/2ML nebulizer solution Take 2 mLs (0.5 mg total) by nebulization 2 (two) times daily. 03/26/17   Javier Glazier, MD  Calcium Carbonate-Vitamin D3 (CALCIUM 600-D) 600-400 MG-UNIT TABS Take by mouth daily.    [provider]  cetirizine (ZYRTEC) 10 MG tablet Take 10 mg by mouth at bedtime.    [provider]  Cholecalciferol (VITAMIN D-3) 1000 units CAPS Take by mouth daily.    [provider]  Cyanocobalamin (VITAMIN B-12) 3000 MCG SUBL Place under the tongue daily.    [provider]  dextromethorphan-guaiFENesin (MUCINEX DM) 30-600 MG 12hr tablet Take 1 tablet by mouth 2 (two) times daily.    [provider]  diphenhydramine-acetaminophen (TYLENOL PM) 25-500 MG TABS tablet Take 1 tablet by mouth at bedtime as needed.    [provider]  docusate sodium (COLACE) 100 MG capsule Take 100 mg by mouth daily.    [provider]  FLUoxetine (PROZAC) 20 MG capsule Take 40 mg by mouth daily.     [provider]  fluticasone Asencion Islam) 50 MCG/ACT nasal spray instill 2 sprays into each nostril once daily 09/11/16   Javier Glazier, MD  fluticasone furoate-vilanterol (BREO ELLIPTA) 100-25 MCG/INH AEPB Inhale 1 puff into the lungs daily. 09/11/16  Javier Glazier, MD  formoterol (PERFOROMIST) 20 MCG/2ML nebulizer solution Take 2 mLs (20 mcg total) by nebulization 2 (two) times daily. 03/26/17   Javier Glazier, MD  loratadine (CLARITIN) 10 MG tablet Take 10 mg by mouth daily.    [provider]  memantine (NAMENDA) 10 MG tablet Take 1 tablet (10 mg total) by mouth 2 (two) times daily. 09/10/16   Melvenia Beam, MD  montelukast (SINGULAIR) 10 MG tablet Take 1 tablet (10 mg total) by mouth daily. 09/11/16   Javier Glazier, MD  olmesartan (BENICAR) 40 MG tablet  Take 40 mg by mouth daily.    [provider]  omeprazole (PRILOSEC) 20 MG capsule take 1 capsule by mouth once daily 30 MINUTES BEFORE A MEAL 05/03/16   [provider]  predniSONE (DELTASONE) 20 MG tablet Take 40 mg by mouth daily. 02/05/17   [provider]  predniSONE (DELTASONE) 20 MG tablet Take 1 tablet (20 mg total) by mouth daily with breakfast. 03/26/17   Javier Glazier, MD  promethazine (PHENERGAN) 25 MG tablet Take 25 mg by mouth 3 (three) times daily as needed for nausea or vomiting.    [provider]    Family History Family History  Problem Relation Age of Onset  . Asthma Unknown   . Colon cancer Father   . Dementia Neg Hx     Social History Social History   Tobacco Use  . Smoking status: Never Smoker  . Smokeless tobacco: Never Used  Substance Use Topics  . Alcohol use: Yes    Alcohol/week: 0.0 oz    Comment: 1 glass daily  . Drug use: No     Allergies   Azithromycin; Aspirin; and Sulfonamide derivatives   Review of Systems Review of Systems  Constitutional: Negative for appetite change, chills, diaphoresis, fatigue and fever.  HENT: Negative for congestion.   Eyes: Negative for photophobia and visual disturbance.  Respiratory: Positive for wheezing (chronic). Negative for cough, chest tightness, shortness of breath and stridor.   Cardiovascular: Negative for chest pain, palpitations and leg swelling.  Gastrointestinal: Negative for abdominal pain, constipation, diarrhea, nausea and vomiting.  Genitourinary: Negative for dysuria and flank pain.  Musculoskeletal: Negative for back pain, neck pain and neck stiffness.  Skin: Positive for wound. Negative for rash.  Neurological: Positive for headaches. Negative for weakness, light-headedness and numbness.  Psychiatric/Behavioral: Negative for agitation.  All other systems reviewed and are negative.    Physical Exam Updated Vital Signs BP (!) 164/79   Pulse 86    Temp 98 F (36.7 C) (Oral)   Resp 14   Ht 5\' 1"  (1.549 m)   Wt 59 kg (130 lb)   SpO2 100%   BMI 24.56 kg/m   Physical Exam  Constitutional: She appears well-developed and well-nourished. No distress.  HENT:  Head: Head is with laceration.    Nose: Nose normal.  Mouth/Throat: Oropharynx is clear and moist. No oropharyngeal exudate.  Eyes: Conjunctivae and EOM are normal. Pupils are equal, round, and reactive to light.  Neck: Normal range of motion.  Cardiovascular: Normal rate and intact distal pulses.  No murmur heard. Pulmonary/Chest: Effort normal and breath sounds normal. No stridor. No respiratory distress. She has no wheezes. She has no rales. She exhibits no tenderness.  Abdominal: Soft. Bowel sounds are normal. There is no tenderness.  Musculoskeletal: She exhibits tenderness.       Right forearm: She exhibits laceration.       Arms:  Neurological: No sensory deficit. She exhibits normal muscle tone.  Skin: Capillary refill takes less than 2 seconds. No rash noted. She is not diaphoretic.  Nursing note and vitals reviewed.    ED Treatments / Results  Labs (all labs ordered are listed, but only abnormal results are displayed) Labs Reviewed  CBC WITH DIFFERENTIAL/PLATELET - Abnormal; Notable for the following components:      Result Value   WBC 11.7 (*)    RBC 3.76 (*)    Hemoglobin 11.4 (*)    HCT 34.9 (*)    Neutro Abs 8.7 (*)    All other components within normal limits  BASIC METABOLIC PANEL - Abnormal; Notable for the following components:   Sodium 132 (*)    Potassium 3.3 (*)    Chloride 95 (*)    Glucose, Bld 142 (*)    GFR calc non Af Amer 59 (*)    All other components within normal limits  PROTIME-INR  MAGNESIUM    EKG  EKG Interpretation  Date/Time:  Saturday July 24 2017 17:36:53 EST Ventricular Rate:  80 PR Interval:    QRS Duration: 99 QT Interval:  393 QTC Calculation: 454 R Axis:   -38 Text Interpretation:  Sinus rhythm Atrial  premature complex Left axis deviation Anterior infarct, old when compared to prior, no significant changes seen.  N0 STEMI Confirmed by Antony Blackbird [phone]) on 07/24/2017 7:07:26 PM       Radiology Dg Chest 2 View  Result Date: 07/24/2017 CLINICAL DATA:  83 year old female status post fall with head hematoma. EXAM: CHEST  2 VIEW COMPARISON:  Most recent prior chest x-ray 04/26/2017 FINDINGS: Stable cardiac and mediastinal contours. Atherosclerotic calcifications are again noted in the transverse aorta. Stable mild bronchitic changes. The lungs are otherwise clear. No pleural effusion or pneumothorax. No acute osseous abnormality. IMPRESSION: Stable chest x-ray without evidence of acute cardiopulmonary process. Electronically Signed   By: Jacqulynn Cadet M.D.   On: 07/24/2017 18:52   Ct Head Wo Contrast  Result Date: 07/24/2017 CLINICAL DATA:  Fall today with back and head pain. Injury to frontal skull. EXAM: CT HEAD WITHOUT CONTRAST CT CERVICAL SPINE WITHOUT CONTRAST TECHNIQUE: Multidetector CT imaging of the head and cervical spine was performed following the standard protocol without intravenous contrast. Multiplanar CT image reconstructions of the cervical spine were also generated. COMPARISON:  None. FINDINGS: CT HEAD FINDINGS Brain: The ventricles, cisterns and other CSF spaces are within normal as there is mild age related atrophic change. There is chronic ischemic microvascular disease. There is no mass, mass effect, shift of midline structures or acute hemorrhage. There is no evidence of acute infarction. Stable prominent CSF space over the periphery of the left cerebellar hemisphere. Vascular: No hyperdense vessel or unexpected calcification. Skull: Normal. Negative for fracture or focal lesion. Sinuses/Orbits: Orbits are normal and symmetric. There is complete opacification over the right frontal sinus which is worse. Minimal opacification over the ethmoid air cells and mild mucosal  membrane thickening of the right maxillary sinus. Minimal mucosal membrane thickening of the sphenoid sinus. Mastoid air cells are clear. Other: Minimal soft tissue swelling over the left frontal scalp. CT CERVICAL SPINE FINDINGS Alignment: Subtle anterior subluxation of C4 on C5 unchanged compatible degenerate subluxation. Skull base and vertebrae: Vertebral body heights are maintained. There is mild to moderate spondylosis throughout the cervical spine. Atlantoaxial articulation is within normal. There is uncovertebral joint spurring and facet arthropathy. Bilateral neural foramina narrowing at multiple levels due to adjacent bony  spurring. There is a displaced fracture involving the posterior and anterior aspect of the right foramen transversarium of C1. There is slight widening, irregularity and sclerosis of the joint space between the right lateral masses of C1 and C2 suggesting this may be chronic injury. No other fractures identified. Soft tissues and spinal canal: Prevertebral soft tissues are within normal. Spinal canal is within normal. Disc levels: Moderate disc space narrowing at the C5-6 and C6-7 levels. Upper chest: Within normal. Other: None. IMPRESSION: No acute brain injury. Minimal soft tissue swelling left frontal scalp. Displaced fracture involving the anterior and posterior aspect of the right foramen transversarium of C1. This fracture is likely chronic in nature, although an acute component is not entirely excluded. Age related atrophy and chronic ischemic microvascular disease. Moderate chronic sinus inflammatory disease as described. Moderate spondylosis of the cervical spine with moderate disc disease at the C5-6 and C6-7 levels. Multilevel bilateral neural foraminal narrowing as described. These results were called by telephone at the time of interpretation on 07/24/2017 at 7:33 pm to Dr. Marda Stalker , who verbally acknowledged these results. Electronically Signed   By: Marin Olp M.D.   On: 07/24/2017 19:33   Ct Cervical Spine Wo Contrast  Result Date: 07/24/2017 CLINICAL DATA:  Fall today with back and head pain. Injury to frontal skull. EXAM: CT HEAD WITHOUT CONTRAST CT CERVICAL SPINE WITHOUT CONTRAST TECHNIQUE: Multidetector CT imaging of the head and cervical spine was performed following the standard protocol without intravenous contrast. Multiplanar CT image reconstructions of the cervical spine were also generated. COMPARISON:  None. FINDINGS: CT HEAD FINDINGS Brain: The ventricles, cisterns and other CSF spaces are within normal as there is mild age related atrophic change. There is chronic ischemic microvascular disease. There is no mass, mass effect, shift of midline structures or acute hemorrhage. There is no evidence of acute infarction. Stable prominent CSF space over the periphery of the left cerebellar hemisphere. Vascular: No hyperdense vessel or unexpected calcification. Skull: Normal. Negative for fracture or focal lesion. Sinuses/Orbits: Orbits are normal and symmetric. There is complete opacification over the right frontal sinus which is worse. Minimal opacification over the ethmoid air cells and mild mucosal membrane thickening of the right maxillary sinus. Minimal mucosal membrane thickening of the sphenoid sinus. Mastoid air cells are clear. Other: Minimal soft tissue swelling over the left frontal scalp. CT CERVICAL SPINE FINDINGS Alignment: Subtle anterior subluxation of C4 on C5 unchanged compatible degenerate subluxation. Skull base and vertebrae: Vertebral body heights are maintained. There is mild to moderate spondylosis throughout the cervical spine. Atlantoaxial articulation is within normal. There is uncovertebral joint spurring and facet arthropathy. Bilateral neural foramina narrowing at multiple levels due to adjacent bony spurring. There is a displaced fracture involving the posterior and anterior aspect of the right foramen transversarium of  C1. There is slight widening, irregularity and sclerosis of the joint space between the right lateral masses of C1 and C2 suggesting this may be chronic injury. No other fractures identified. Soft tissues and spinal canal: Prevertebral soft tissues are within normal. Spinal canal is within normal. Disc levels: Moderate disc space narrowing at the C5-6 and C6-7 levels. Upper chest: Within normal. Other: None. IMPRESSION: No acute brain injury. Minimal soft tissue swelling left frontal scalp. Displaced fracture involving the anterior and posterior aspect of the right foramen transversarium of C1. This fracture is likely chronic in nature, although an acute component is not entirely excluded. Age related atrophy and chronic ischemic microvascular disease. Moderate  chronic sinus inflammatory disease as described. Moderate spondylosis of the cervical spine with moderate disc disease at the C5-6 and C6-7 levels. Multilevel bilateral neural foraminal narrowing as described. These results were called by telephone at the time of interpretation on 07/24/2017 at 7:33 pm to Dr. Marda Stalker , who verbally acknowledged these results. Electronically Signed   By: Marin Olp M.D.   On: 07/24/2017 19:33    Procedures Wound closure utilizing adhes only Date/Time: 07/24/2017 11:23 PM Performed by: Courtney Paris, MD Authorized by: Courtney Paris, MD  Consent: Verbal consent obtained. Risks and benefits: risks, benefits and alternatives were discussed Consent given by: patient Patient identity confirmed: verbally with patient Time out: Immediately prior to procedure a "time out" was called to verify the correct patient, procedure, equipment, support staff and site/side marked as required. Preparation: Patient was prepped and draped in the usual sterile fashion. Local anesthesia used: yes  Anesthesia: Local anesthesia used: yes Local Anesthetic: LET (lido,epi,tetracaine) and lidocaine 2% with  epinephrine Anesthetic total: 2 mL  Sedation: Patient sedated: no  Patient tolerance: Patient tolerated the procedure well with no immediate complications    (including critical care time)  Medications Ordered in ED Medications  Tdap (BOOSTRIX) injection 0.5 mL (0.5 mLs Intramuscular Not Given 07/24/17 1919)  lidocaine-EPINEPHrine-tetracaine (LET) solution (3 mLs Topical Given 07/24/17 2025)  acetaminophen (TYLENOL) tablet 650 mg (650 mg Oral Given 07/24/17 2139)  lidocaine-EPINEPHrine (XYLOCAINE W/EPI) 2 %-1:200000 (PF) injection 10 mL (10 mLs Intradermal Given 07/24/17 2138)     Initial Impression / Assessment and Plan / ED Course  I have reviewed the triage vital signs and the nursing notes.  Pertinent labs & imaging results that were available during my care of the patient were reviewed by me and considered in my medical decision making (see chart for details).      [name] is a 83 y.o. female with a past medical history significant for asthma, hypertension, and hyperlipidemia who presents with a fall and head injury.  Patient reports that she remembers the fall and got tripped up leading her to fall down.  She denies loss of consciousness.  She reports a mild to moderate headache.  Patient was brought in by EMS after he was discovered she had a laceration to the forehead and a laceration to the right arm.  Right arm appears to be a skin tear.  Patient is left-handed and is not having any pain in the elbow.  She denies recent chest pain, shortness of breath, palpitations, lightheadedness, nausea, vomiting, vision changes, urinary symptoms or GI symptoms.    When EMS picked of the patient, they reported she had wheezing.  They gave her albuterol, Atrovent, and Solu-Medrol.  Patient reports that she always wheezes and is not having any shortness breath or other symptoms.  EMS was concerned for atrial fibrillation however on initial EKG, it appeared to be a sinus rhythm with  occasional arrhythmia.  On exam, patient found to have a small laceration to the left forehead.  It was hemostatic.  Patient is unsure of her last tetanus vaccination, will update.  Patient also had a skin tear to the right elbow without significant pain or tenderness.  Patient had normal grip strength, pulses, and sensation in the upper extremities.  Patient had no focal neurologic deficits on my exam.  Pupils were reactive and had normal movements bilaterally.  No facial droop and patient was alert and oriented.    Due to the patient's headache  and the fall, patient will have CT of the head and neck.  Patient also have chest x-ray and lab testing to look further ab normalities may contribute to her fall.  CT of the head showed no acute brain injury.  CT of the cervical spine showed evidence of a possibly chronic C1 fracture.  Will reassess the patient's neck for tenderness or pain.  If there is any abnormality, we will likely speak with neurosurgery.  Next  We will clean the patient's wounds and determine if she wound management in the ED.  Patient's wounds were cleaned and the forehead laceration is well approximated.  It does not appear to go deep.  Steri-Strips were used to help maintain approximation.  Wound was hemostatic.  Do not feel patient needs sutures given her frail skin.  Wound was dressed.  Elbow wound was also washed and cleaned.  Steri-Strips were used to try to approximate the skin tear.  Nonadhesive gauze jelly gauze was used and wound was dressed.  It was hemostatic.  Family was advised on wound care.  Patient will be discharged with plans to follow-up with both PCP as well as neurosurgery for the old C1 fracture.  Given patient's reassuring neurologic exam and her report that he is having no neck pain at this time, do not feel this is new.  They report the patient has had on and off neck pain for years and suspect that that is likely the cause.  Do not feel that neurosurgery needs  to see the patient tonight but will call to confirm disposition.  Neurosurgery called back to report they suspect the images old based on the images.  They did not feel patient needs to go home in a collar.  Patient will follow-up as an outpatient.   Patient understood return precautions for any signs or symptoms of head or neck pathology.  Patient discharged in good condition.   Final Clinical Impressions(s) / ED Diagnoses   Final diagnoses:  Fall, initial encounter  Other closed displaced fracture of first cervical vertebra, initial encounter ([place])  Laceration of forehead, initial encounter  Skin tear of right elbow without complication, initial encounter    ED Discharge Orders    None      Clinical Impression: 1. Fall, initial encounter   2. Other closed displaced fracture of first cervical vertebra, initial encounter ([place])   3. Laceration of forehead, initial encounter   4. Skin tear of right elbow without complication, initial encounter     Disposition: Discharge  Condition: Good  I have discussed the results, Dx and Tx plan with the pt(& family if present). He/she/they expressed understanding and agree(s) with the plan. Discharge instructions discussed at great length. Strict return precautions discussed and pt &/or family have verbalized understanding of the instructions. No further questions at time of discharge.    This SmartLink is deprecated. Use AVSMEDLIST instead to display the medication list for a patient.  Follow Up: [name], MD [place] [address] [place] [address] [id number] [place] [phone]  If symptoms worsen  Earnie Larsson, MD [phone] [address] [place] [address] [phone]        Machai Desmith, Gwenyth Allegra, MD 07/25/17 [phone]

## 2017-07-25 DIAGNOSIS — S0990XA Unspecified injury of head, initial encounter: Secondary | ICD-10-CM | POA: Diagnosis not present

## 2017-07-25 DIAGNOSIS — G8911 Acute pain due to trauma: Secondary | ICD-10-CM | POA: Diagnosis not present

## 2017-07-28 MED ORDER — OMALIZUMAB 150 MG ~~LOC~~ SOLR
300.0000 mg | Freq: Once | SUBCUTANEOUS | Status: AC
  Administered 2017-07-21: 300 mg via SUBCUTANEOUS

## 2017-08-04 ENCOUNTER — Ambulatory Visit (INDEPENDENT_AMBULATORY_CARE_PROVIDER_SITE_OTHER): Payer: Medicare Other

## 2017-08-04 ENCOUNTER — Telehealth: Payer: Self-pay | Admitting: Pulmonary Disease

## 2017-08-04 DIAGNOSIS — J4540 Moderate persistent asthma, uncomplicated: Secondary | ICD-10-CM

## 2017-08-04 NOTE — Telephone Encounter (Signed)
#   PFS:6 Ordered date:08/04/17 Shipping Date:08/04/17

## 2017-08-05 NOTE — Telephone Encounter (Signed)
#   PFS:4 Arrival Date:08/05/17 Lot #:[id number] Exp Date:07/2018

## 2017-08-10 MED ORDER — OMALIZUMAB 150 MG ~~LOC~~ SOLR
300.0000 mg | SUBCUTANEOUS | Status: DC
  Administered 2017-08-04: 300 mg via SUBCUTANEOUS

## 2017-08-10 NOTE — Progress Notes (Signed)
Documentation of medication administration and charges of Xolair have been completed by Maryland Luppino, CMA based on the Xolair documentation sheet completed by Tammy Scott.  

## 2017-08-11 ENCOUNTER — Telehealth: Payer: Self-pay | Admitting: Acute Care

## 2017-08-11 NOTE — Telephone Encounter (Addendum)
Regina with [facility] is aware of below and voiced her understaning. Rollene Fare states Rx for prednisone will be written up and faxed over for signature. Will leave message in triage until Rx is received

## 2017-08-11 NOTE — Telephone Encounter (Signed)
Can send script for prednisone 10 mg pill >> 3 pills daily for 2 days, 2 pill daily for 2 days, 1 pill daily for 2 days.  #12 with no refills.  If she doesn't improve, then she needs ROV.

## 2017-08-11 NOTE — Telephone Encounter (Signed)
Called and spoke to [place] with [facility].  Rollene Fare states pt has an heavy asthmatic wheeze. Rollene Fare states wheezing does not improve with albuterol neb solution.  Denies additional symptoms.  VS please advise. Thanks.

## 2017-08-12 DIAGNOSIS — S12041A Nondisplaced lateral mass fracture of first cervical vertebra, initial encounter for closed fracture: Secondary | ICD-10-CM | POA: Diagnosis not present

## 2017-08-12 NOTE — Telephone Encounter (Signed)
No rx on fax machine, or in VS's folders. It appears that VS' folder may have been emptied? Kelli please advise if you have this rx, or if we need to re-request it from [facility].  Thanks!

## 2017-08-12 NOTE — Telephone Encounter (Signed)
Spoke with Vida Roller who advised that she has not received a fax for this patient.   Called [facility] and spoke with [facility], who advised that a signature from our office is not needed for this rx- the verbal recs will be relayed to the docs at [facility], who will dispense the rx to the patient.  Nothing further needed.

## 2017-08-12 NOTE — Telephone Encounter (Signed)
Spoke with Caryl Pina in Triage regarding patient script She will be calling [facility] again today

## 2017-08-18 ENCOUNTER — Ambulatory Visit (INDEPENDENT_AMBULATORY_CARE_PROVIDER_SITE_OTHER): Payer: Medicare Other

## 2017-08-18 DIAGNOSIS — J4540 Moderate persistent asthma, uncomplicated: Secondary | ICD-10-CM | POA: Diagnosis not present

## 2017-08-25 DIAGNOSIS — I129 Hypertensive chronic kidney disease with stage 1 through stage 4 chronic kidney disease, or unspecified chronic kidney disease: Secondary | ICD-10-CM | POA: Diagnosis not present

## 2017-08-25 DIAGNOSIS — R413 Other amnesia: Secondary | ICD-10-CM | POA: Diagnosis not present

## 2017-08-25 DIAGNOSIS — S129XXD Fracture of neck, unspecified, subsequent encounter: Secondary | ICD-10-CM | POA: Diagnosis not present

## 2017-08-25 DIAGNOSIS — F331 Major depressive disorder, recurrent, moderate: Secondary | ICD-10-CM | POA: Diagnosis not present

## 2017-08-25 DIAGNOSIS — R159 Full incontinence of feces: Secondary | ICD-10-CM | POA: Diagnosis not present

## 2017-08-25 DIAGNOSIS — R32 Unspecified urinary incontinence: Secondary | ICD-10-CM | POA: Diagnosis not present

## 2017-08-25 DIAGNOSIS — E871 Hypo-osmolality and hyponatremia: Secondary | ICD-10-CM | POA: Diagnosis not present

## 2017-08-25 DIAGNOSIS — N183 Chronic kidney disease, stage 3 (moderate): Secondary | ICD-10-CM | POA: Diagnosis not present

## 2017-08-25 DIAGNOSIS — E559 Vitamin D deficiency, unspecified: Secondary | ICD-10-CM | POA: Diagnosis not present

## 2017-08-25 DIAGNOSIS — M810 Age-related osteoporosis without current pathological fracture: Secondary | ICD-10-CM | POA: Diagnosis not present

## 2017-08-25 DIAGNOSIS — E538 Deficiency of other specified B group vitamins: Secondary | ICD-10-CM | POA: Diagnosis not present

## 2017-08-25 DIAGNOSIS — I10 Essential (primary) hypertension: Secondary | ICD-10-CM | POA: Diagnosis not present

## 2017-08-25 MED ORDER — OMALIZUMAB 150 MG ~~LOC~~ SOLR
300.0000 mg | SUBCUTANEOUS | Status: DC
  Administered 2017-08-18: 300 mg via SUBCUTANEOUS

## 2017-08-25 NOTE — Progress Notes (Signed)
Documentation of medication administration and charges of Xolair have been completed by Bernarda Erck, CMA based on the Xolair documentation sheet completed by Tammy Scott.  

## 2017-09-01 ENCOUNTER — Ambulatory Visit (INDEPENDENT_AMBULATORY_CARE_PROVIDER_SITE_OTHER): Payer: Medicare Other

## 2017-09-01 DIAGNOSIS — J4540 Moderate persistent asthma, uncomplicated: Secondary | ICD-10-CM | POA: Diagnosis not present

## 2017-09-02 MED ORDER — OMALIZUMAB 150 MG ~~LOC~~ SOLR
300.0000 mg | Freq: Once | SUBCUTANEOUS | Status: AC
  Administered 2017-09-01: 300 mg via SUBCUTANEOUS

## 2017-09-07 ENCOUNTER — Ambulatory Visit: Payer: Self-pay | Admitting: Internal Medicine

## 2017-09-09 ENCOUNTER — Telehealth: Payer: Self-pay | Admitting: Pulmonary Disease

## 2017-09-09 NOTE — Telephone Encounter (Signed)
#   vials:4 Ordered date:09/09/17 Shipping Date:09/09/17

## 2017-09-10 NOTE — Telephone Encounter (Signed)
#   Vials:4 Arrival Date:09/10/17 Lot #:[id number] Exp Date:07/2018

## 2017-09-14 ENCOUNTER — Ambulatory Visit (INDEPENDENT_AMBULATORY_CARE_PROVIDER_SITE_OTHER): Payer: Medicare Other | Admitting: Neurology

## 2017-09-14 ENCOUNTER — Encounter: Payer: Self-pay | Admitting: Neurology

## 2017-09-14 VITALS — BP 128/70 | HR 84

## 2017-09-14 DIAGNOSIS — G301 Alzheimer's disease with late onset: Secondary | ICD-10-CM

## 2017-09-14 DIAGNOSIS — F0280 Dementia in other diseases classified elsewhere without behavioral disturbance: Secondary | ICD-10-CM

## 2017-09-14 MED ORDER — DONEPEZIL HCL 5 MG PO TABS: 2.5000 mg | ORAL_TABLET | Freq: Every day | ORAL | 8 refills | Status: DC

## 2017-09-14 NOTE — Patient Instructions (Addendum)
Start Aricept (Donepezil) at 2.5mg  an evening. Can cut the pill in half for 2 weeks. If no side effects we will increase to a whole pill which is 5mg . We may further increase to 10mg  daily.   Donepezil tablets What is this medicine? DONEPEZIL (doe NEP e zil) is used to treat mild to moderate dementia caused by Alzheimer's disease. This medicine may be used for other purposes; ask your health care provider or pharmacist if you have questions. COMMON BRAND NAME(S): Aricept What should I tell my health care provider before I take this medicine? They need to know if you have any of these conditions: -asthma or other lung disease -difficulty passing urine -head injury -heart disease -history of irregular heartbeat -liver disease -seizures (convulsions) -stomach or intestinal disease, ulcers or stomach bleeding -an unusual or allergic reaction to donepezil, other medicines, foods, dyes, or preservatives -pregnant or trying to get pregnant -breast-feeding How should I use this medicine? Take this medicine by mouth with a glass of water. Follow the directions on the prescription label. You may take this medicine with or without food. Take this medicine at regular intervals. This medicine is usually taken before bedtime. Do not take it more often than directed. Continue to take your medicine even if you feel better. Do not stop taking except on your doctor's advice. If you are taking the 23 mg donepezil tablet, swallow it whole; do not cut, crush, or chew it. Talk to your pediatrician regarding the use of this medicine in children. Special care may be needed. Overdosage: If you think you have taken too much of this medicine contact a poison control center or emergency room at once. NOTE: This medicine is only for you. Do not share this medicine with others. What if I miss a dose? If you miss a dose, take it as soon as you can. If it is almost time for your next dose, take only that dose, do not  take double or extra doses. What may interact with this medicine? Do not take this medicine with any of the following medications: -certain medicines for fungal infections like itraconazole, fluconazole, posaconazole, and voriconazole -cisapride -dextromethorphan; quinidine -dofetilide -dronedarone -pimozide -quinidine -thioridazine -ziprasidone This medicine may also interact with the following medications: -antihistamines for allergy, cough and cold -atropine -bethanechol -carbamazepine -certain medicines for bladder problems like oxybutynin, tolterodine -certain medicines for Parkinson's disease like benztropine, trihexyphenidyl -certain medicines for stomach problems like dicyclomine, hyoscyamine -certain medicines for travel sickness like scopolamine -dexamethasone -ipratropium -NSAIDs, medicines for pain and inflammation, like ibuprofen or naproxen -other medicines for Alzheimer's disease -other medicines that prolong the QT interval (cause an abnormal heart rhythm) -phenobarbital -phenytoin -rifampin, rifabutin or rifapentine This list may not describe all possible interactions. Give your health care provider a list of all the medicines, herbs, non-prescription drugs, or dietary supplements you use. Also tell them if you smoke, drink alcohol, or use illegal drugs. Some items may interact with your medicine. What should I watch for while using this medicine? Visit your doctor or health care professional for regular checks on your progress. Check with your doctor or health care professional if your symptoms do not get better or if they get worse. You may get drowsy or dizzy. Do not drive, use machinery, or do anything that needs mental alertness until you know how this drug affects you. What side effects may I notice from receiving this medicine? Side effects that you should report to your doctor or health care professional as soon  as possible: -allergic reactions like skin  rash, itching or hives, swelling of the face, lips, or tongue -feeling faint or lightheaded, falls -loss of bladder control -seizures -signs and symptoms of a dangerous change in heartbeat or heart rhythm like chest pain; dizziness; fast or irregular heartbeat; palpitations; feeling faint or lightheaded, falls; breathing problems -signs and symptoms of infection like fever or chills; cough; sore throat; pain or trouble passing urine -signs and symptoms of liver injury like dark yellow or brown urine; general ill feeling or flu-like symptoms; light-colored stools; loss of appetite; nausea; right upper belly pain; unusually weak or tired; yellowing of the eyes or skin -slow heartbeat or palpitations -unusual bleeding or bruising -vomiting Side effects that usually do not require medical attention (report to your doctor or health care professional if they continue or are bothersome): -diarrhea, especially when starting treatment -headache -loss of appetite -muscle cramps -nausea -stomach upset This list may not describe all possible side effects. Call your doctor for medical advice about side effects. You may report side effects to FDA at 1-800-FDA-1088. Where should I keep my medicine? Keep out of reach of children. Store at room temperature between 15 and 30 degrees C (59 and 86 degrees F). Throw away any unused medicine after the expiration date. NOTE: This sheet is a summary. It may not cover all possible information. If you have questions about this medicine, talk to your doctor, pharmacist, or health care provider.  2018 Elsevier/Gold Standard (2016-01-06 21:00:42)

## 2017-09-14 NOTE — Progress Notes (Signed)
[name]    Provider:  Dr [name] Referring Provider: [name], MD Primary Care Physician:  Julia Pao, MD  CC: Memory problems  Interval history 09/14/2017: She has alarms on her bed and chair and wheelchair and a call button. She is in skilled nursing. She was moved from memory unit because she was not a flight risk. It is higher level than independent living. She also has a personal aid from 9-1 every day. Patient thinks things are "alright" daughter says she is confused more about things. Memory is the same. She can converse. She is wheelchair bound. She is worse at night a little more confused about where she is at [name] Daughter calls her every day. She likes to go back to her room after she eats and she likes to get in bed. She lays there for several hours just thinking and when daughter calls she is somewhere else. No behavioral issues. No hallucinations or delusions. Sometimes she has dinner with her dad.   CT 07/24/2017: personally reviewed images and agree with the following   Brain: The ventricles, cisterns and other CSF spaces are within normal as there is mild age related atrophic change. There is chronic ischemic microvascular disease. There is no mass, mass effect, shift of midline structures or acute hemorrhage. There is no evidence of acute infarction. Stable prominent CSF space over the periphery of the left cerebellar hemisphere.  Vascular: No hyperdense vessel or unexpected calcification.  Skull: Normal. Negative for fracture or focal lesion.  Sinuses/Orbits: Orbits are normal and symmetric. There is complete opacification over the right frontal sinus which is worse. Minimal opacification over the ethmoid air cells and mild mucosal membrane thickening of the right maxillary sinus. Minimal mucosal membrane thickening of the sphenoid sinus. Mastoid air cells are clear.  Other: Minimal soft tissue swelling over the left  frontal scalp.  CT CERVICAL SPINE FINDINGS  Alignment: Subtle anterior subluxation of C4 on C5 unchanged compatible degenerate subluxation.  Skull base and vertebrae: Vertebral body heights are maintained. There is mild to moderate spondylosis throughout the cervical spine. Atlantoaxial articulation is within normal. There is uncovertebral joint spurring and facet arthropathy. Bilateral neural foramina narrowing at multiple levels due to adjacent bony spurring. There is a displaced fracture involving the posterior and anterior aspect of the right foramen transversarium of C1. There is slight widening, irregularity and sclerosis of the joint space between the right lateral masses of C1 and C2 suggesting this may be chronic injury. No other fractures identified.  Soft tissues and spinal canal: Prevertebral soft tissues are within normal. Spinal canal is within normal.  Disc levels: Moderate disc space narrowing at the C5-6 and C6-7 levels.  Upper chest: Within normal.  Other: None.  IMPRESSION: No acute brain injury. Minimal soft tissue swelling left frontal scalp.  Displaced fracture involving the anterior and posterior aspect of the right foramen transversarium of C1. This fracture is likely chronic in nature, although an acute component is not entirely excluded.  Age related atrophy and chronic ischemic microvascular disease.  Moderate chronic sinus inflammatory disease as described.  Moderate spondylosis of the cervical spine with moderate disc disease at the C5-6 and C6-7 levels. Multilevel bilateral neural foraminal narrowing as described.  These results were called by telephone at the time of interpretation on 07/24/2017 at 7:33 pm to Dr. Marda [name] , who verbally acknowledged these results.   Interval history 09/08/2016: Here for follow up of dementia She is on namenda. Did not  tolerate AChesterase meds.Will continue namenda. Her right knee  is giving out causing falls but she is in assisted living now with in-home care. Will increase Namenda to 10mg  twice daily. She is in assisted living. Here with her daughter. She gets confused and forgets more. Here with her daughter [name]. She gets a little more confused. She still asks if she has another apartment she is going back to, sometimes she thinks things are going on that are not such as she said they rolled her into an empty house and remembering things that did not happen. She is not remembering as well as she did. She is administered medicine by caretakers. Sometimes she turns the wrong way to go to the dining hall and gets confused. She did say that her husband visited her one night but at the time she had a virus. Otherwise no hallucinations or delusions or behavioral issues. Prozac is working well. She has been in assisted living since last fall. She is having shots in the knees. She is in PT.   Interval history 03/04/2016: Memory is worsening. She did not tolerate Aricept or Exelon patch due to GI effects, made her sick. Her B12 and TSH were normal. Had a long discussion about dementia, this is a progressive memory disorder. Other medications we can try include Namenda, will start today, discussed side effects. Daughter here and provides most information.   Reviewed images and findings with patient and daughter:C/w dementia. Clinically appears to be Alzheimer's,  may be a vascular component as well  This MRI of the brain without contrast shows the following: 1. Generalized cortical atrophy that has progressed since 2012. The atrophy is most pronounced in the mesial temporal lobes. 2. Widespread T2/FLAIR hyperintense foci in both hemispheres, pons and cerebellum consistent with chronic microvascular ischemic changes. Many of these changes in the hemispheres and the cerebellum were present in 2012. 3. Although the signal changes in the pons most likely represent chronic  microvascular ischemic foci, superimposed metabolic issues cannot be ruled out. 4. Chronic inflammation of the right frontal, right ethmoid cells, right hemi-sphenoid and right maxillary sinusitis with superimposed acute right maxillary sinusitis.  Julia M Hornadayis a very pleasant 84 y.o.femalehere as a referral from Dr. Lenor Derrick memory loss. PMHx of Asthma, gerd, depression, cataracts. Started when she had a hip replacement after anesthesia 3-4 years ago. That's when daughter, who provides most information, first noticed it. It was mild at the time, this past fall started progressively worsening. The last 2 months it has gotten a lot worse. She gets confused more. She had a physical therapy appointment and she was confused when she had a new therapist she didn't know. She gets confused about appointments or whether daughter is working or not. Patient gets confused about where daughter is even though it is a work day and she should know where daughter is(at work). She lives in [place] alone in an apartment. She misses medications occasionally, daughter manages the finances, she does not drive because she felt her reaction time was less, she misplaces things and can't find them occasionally, patient is independent with most ADLs and IADLs. She eats in the [employer]. She has depression and it well controlled, daughter doesn't notice mood problems, she is not as outgoing as she was. No hallucinations or delusions. She is more fatigued.   Reviewed notes, labs and imaging from outside physicians, which showed:  Ct of the head 01/2011: personally reviewed images and agree with the following:  CT HEAD  WITHOUT CONTRAST  Soft tissue windows demonstrate expected cerebral atrophy. Mild low density in the periventricular white matter likely related to small vessel disease. Slightly asymmetric, greater left than right. No mass lesion, hemorrhage, hydrocephalus, acute infarct,  intra-axial, or extra-axial fluid collection.  IMPRESSION:personally reviewed and agree with the following:  1. No acute intracranial abnormality. 2. Cerebral atrophy and small vessel ischemic change. 3. Soft tissue swelling about the left supraorbital region.  Review of Systems: Patient complains of symptoms per HPI as well as the following symptoms: cough, wheezing, easy bruising, hering loss, incontinence, memory loss, confusion, decreased energy. Pertinent negatives per HPI. All others negative.  Social History   Socioeconomic History  . Marital status: Married    Spouse name: Not on file  . Number of children: 1  . Years of education: 28  . Highest education level: Not on file  Social Needs  . Financial resource strain: Not on file  . Food insecurity - worry: Not on file  . Food insecurity - inability: Not on file  . Transportation needs - medical: Not on file  . Transportation needs - non-medical: Not on file  Occupational History  . Occupation: retired    Fish farm manager: RETIRED  Tobacco Use  . Smoking status: Never Smoker  . Smokeless tobacco: Never Used  Substance and Sexual Activity  . Alcohol use: Yes    Alcohol/week: 0.0 oz    Comment: 1 glass daily  . Drug use: No  . Sexual activity: Not on file  Other Topics Concern  . Not on file  Social History Narrative   Originally from [place]. She previously lived in [place]. 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 No bird exposure. No recent mold exposure. Previous hold did have mold.       Lives at [facility] community: skilled nursing area    Caffeine use: 1 cup coffee /day    Family History  Problem Relation Age of Onset  . Asthma Unknown   . Colon cancer Father   . Dementia Neg Hx     Past Medical History:  Diagnosis Date  . Arthritis   . Asthma   . Depression   . Environmental allergies    mold  . Fall   . Hyperlipidemia   . Hypertension   . Memory loss   . Seasonal allergies     Past  Surgical History:  Procedure Laterality Date  . HIP ARTHROPLASTY  06/25/2011   Procedure: ARTHROPLASTY BIPOLAR HIP;  Surgeon: Laurice Record Aplington;  Location: [facility];  Service: Orthopedics;  Laterality: Right;  . NASAL SINUS SURGERY    . VAGINAL HYSTERECTOMY      Current Outpatient Medications  Medication Sig Dispense Refill  . acetaminophen (TYLENOL) 325 MG tablet Take 650 mg by mouth every 4 (four) hours as needed (pain).    Marland Kitchen albuterol (PROVENTIL HFA;VENTOLIN HFA) 108 (90 Base) MCG/ACT inhaler Inhale 1-2 puffs into the lungs every 6 (six) hours as needed for wheezing or shortness of breath. 1 Inhaler 6  . albuterol (PROVENTIL) (2.5 MG/3ML) 0.083% nebulizer solution Take 3 mLs (2.5 mg total) by nebulization every 4 (four) hours as needed for wheezing or shortness of breath. 120 mL 3  . budesonide (PULMICORT) 0.5 MG/2ML nebulizer solution Take 2 mLs (0.5 mg total) by nebulization 2 (two) times daily. 60 mL 6  . Calcium Carb-Cholecalciferol (CALCIUM 600-D PO) Take 1 tablet by mouth daily.    . cetirizine (ZYRTEC) 10 MG tablet Take 10 mg by mouth at  bedtime.    . Cholecalciferol (VITAMIN D) 2000 units tablet Take 2,000 Units by mouth at bedtime.    Marland Kitchen dextromethorphan (DELSYM) 30 MG/5ML liquid Take 60 mg by mouth every 12 (twelve) hours as needed for cough.    . dextromethorphan-guaiFENesin (MUCINEX DM) 30-600 MG 12hr tablet Take 1 tablet by mouth 2 (two) times daily as needed for cough (congestion).     . diphenhydramine-acetaminophen (TYLENOL PM) 25-500 MG TABS tablet Take 1 tablet by mouth at bedtime.     . docusate sodium (COLACE) 100 MG capsule Take 100 mg by mouth at bedtime.     Marland Kitchen FLUoxetine (PROZAC) 20 MG capsule Take 40 mg by mouth daily.     . fluticasone (FLONASE) 50 MCG/ACT nasal spray instill 2 sprays into each nostril once daily (Patient taking differently: Place 2 sprays into both nostrils daily. ) 16 g 6  . formoterol (PERFOROMIST) 20 MCG/2ML nebulizer solution Take 2 mLs (20 mcg  total) by nebulization 2 (two) times daily. 60 mL 6  . furosemide (LASIX) 20 MG tablet Take 20 mg by mouth daily.    Marland Kitchen Heat Wraps (THERMACARE BACK/HIP) MISC Place 1 patch onto the skin as needed. Not to be worn more than 8 hours    . ipratropium-albuterol (DUONEB) 0.5-2.5 (3) MG/3ML SOLN Take 3 mLs by nebulization every 4 (four) hours as needed (shortness of breath/wheezing).    . Liniments (SALONPAS PAIN RELIEF PATCH EX) Place 1 patch onto the skin daily as needed (neck pain (remove patch after 12 hours)).    Marland Kitchen memantine (NAMENDA) 10 MG tablet Take 1 tablet (10 mg total) by mouth 2 (two) times daily. 60 tablet 11  . montelukast (SINGULAIR) 10 MG tablet Take 1 tablet (10 mg total) by mouth daily. (Patient taking differently: Take 10 mg by mouth at bedtime. ) 30 tablet 6  . olmesartan (BENICAR) 40 MG tablet Take 40 mg by mouth daily.    . Omalizumab (XOLAIR) 150 MG/ML SOSY Inject 150 mg into the skin every 14 (fourteen) days. By [facility]    . omeprazole (PRILOSEC) 20 MG capsule Take 20 mg by mouth daily before breakfast.    . polyethylene glycol (MIRALAX / GLYCOLAX) packet Take 17 g by mouth daily.    Marland Kitchen spiritus frumenti (ETHYL ALCOHOL) SOLN Take 1 each by mouth See admin instructions. May have 1-2 glasses of wine every evening(4 oz each) for a total of 8 oz daily    . vitamin B-12 (CYANOCOBALAMIN) 1000 MCG tablet Take 3,000 mcg by mouth daily.    Marland Kitchen donepezil (ARICEPT) 5 MG tablet Take 0.5 tablets (2.5 mg total) by mouth at bedtime. 30 tablet 8   No current facility-administered medications for this visit.     Allergies as of 09/14/2017 - Review Complete 09/14/2017  Allergen Reaction Noted  . Azithromycin Other (See Comments) 04/17/2015  . Aspirin Other (See Comments) 06/25/2011  . Sulfonamide derivatives Other (See Comments) 05/13/2007    Vitals: BP 128/70 (BP Location: Left Arm, Patient Position: Sitting)   Pulse 84  Last Weight:  Wt Readings from Last 1 Encounters:  07/24/17  130 lb (59 kg)   Last Height:   Ht Readings from Last 1 Encounters:  07/24/17 5\' 1"  (1.549 m)   Montreal Cognitive Assessment  09/08/2016 03/04/2016 09/04/2015  Visuospatial/ Executive (0/5) 4 1 1   Naming (0/3) 3 3 2   Attention: Read list of digits (0/2) 2 2 2   Attention: Read list of letters (0/1) 1 1 1   Attention:  Serial 7 subtraction starting at 100 (0/3) 1 1 3   Language: Repeat phrase (0/2) 2 2 2   Language : Fluency (0/1) 1 1 1   Abstraction (0/2) 2 1 1   Delayed Recall (0/5) 0 0 0  Orientation (0/6) 3 4 4   Total 19 16 17   Adjusted Score (based on education) 19 16 18     Cranial Nerves:    The pupils are equal, round, and reactive to light. The fundi are flat. Visual fields are full to finger confrontation. Extraocular movements are intact. Trigeminal sensation is intact and the muscles of mastication are normal. The face is symmetric. The palate elevates in the midline. Hearing intact. Voice is normal. Shoulder shrug is normal. The tongue has normal motion without fasciculations.   Coordination:    No dysmetria  Gait:    Cautious with walker  Motor Observation:    No asymmetry, no atrophy, and no involuntary movements noted. Tone:    Normal muscle tone.    Posture:    Posture is normal. normal erect    Strength:    Strength is V/V in the upper and lower limbs.      Sensation: intact to LT     Reflex Exam:  DTR's:    Absent AJs otherwise deep tendon reflexes in the upper and lower extremities are normal bilaterally.   Toes:    The toes are equiv bilaterally.   Clonus:    Clonus is absent.         Assessment/Plan:very pleasant 84 y.o. female here as a follow up from Dr. Osborne Casco for memory loss with her daughter. PMHx of Asthma, gerd, depression, cataracts. Started when she had a hip replacement after anesthesia 3-4 years ago. MoCA 19/30 today with worsening of memory per daughter c/w moderate to advanced cognitive impairment and dementia. She is  [facility] new nursing facility  As far as your medications are concerned, I would like to suggest:  I would like to suggest: continue Namenda to 10mg  twice daily. Will try Aricept again. Start Aricept (Donepezil) at 2.5mg  an evening. Can cut the pill in half for 2 weeks. If no side effects we will increase to a whole pill which is 5mg . We may further increase to 10mg  daily.   Discussed doing yoga, water aerobics. She used to particiapte, I encouraged starting again at [name]   Diagnostic testing: MRI of the brain and labs: reviewed with patient and daughter at last appointment showing atrophy more pronounced in the mesial temporal lobes c/w alzheimer's dementia, showed them the images. Reviewed new CT images with them, stable.   Recommend calcium and vitamin D.   Addendum: Received notes from primary care, labs completed 02/26/2016. CMP with creatinine of 1, otherwise normal, CBC unremarkable with some mild anemia, vitamin D normal 40 6., B12 1928  Julia Ill, MD  [name] [address] [place], [place] [address]  Phone [phone] Fax [phone]  A total of 25 minutes was spent face-to-face with this patient. Over half this time was spent on counseling patient on the dementia diagnosis and different diagnostic and therapeutic options available.

## 2017-09-15 ENCOUNTER — Ambulatory Visit (INDEPENDENT_AMBULATORY_CARE_PROVIDER_SITE_OTHER): Payer: Medicare Other

## 2017-09-15 ENCOUNTER — Telehealth: Payer: Self-pay | Admitting: Pulmonary Disease

## 2017-09-15 DIAGNOSIS — J4540 Moderate persistent asthma, uncomplicated: Secondary | ICD-10-CM

## 2017-09-15 NOTE — Telephone Encounter (Signed)
Spoke with Alroy Bailiff and she questioned if ok to give pt her Xolair injection as pt has had a recent dry cough. Pt is a former [facility] pt and has an upcoming appt with BQ on 09/28/2017 to establish care. Pt only c/o dry cough, pt denies CP/tightness, increase in SOB, chest congestion, f/c/s, body aches. Per BQ ok to give Xolair. While speaking with pt she states she was unable to sleep well last night due to this dry cough. Pt is requesting something to help her with cough. Pt states prednisone has helped in the past. Advised pt to take OTC delsym for cough, pt states she has taken this without help.   Dr. Lake Bells please advise. Thanks.

## 2017-09-15 NOTE — Telephone Encounter (Signed)
Misty with [facility] returning call, CB is [phone].

## 2017-09-15 NOTE — Telephone Encounter (Signed)
Lmtcb x1 for [employer] with [facility]

## 2017-09-15 NOTE — Telephone Encounter (Signed)
In lieu of BQ being unavailable TP please advise. Patient is coming in for her xoliar injection but has a dry cough that has been going on for a few days. States they have been using neb treatments and other meds but nothing is helping. Last time patient had to be placed on prednisone. TP please advise on what she should do, no other symptoms present.

## 2017-09-15 NOTE — Telephone Encounter (Signed)
Prednisone 20mg daily x 5 days

## 2017-09-15 NOTE — Telephone Encounter (Signed)
Spoke with [employer]. Took verbal order for medication nothing further needed.

## 2017-09-15 NOTE — Telephone Encounter (Signed)
BQ is in clinic this morning Routing to Dr Lake Bells for recommendations

## 2017-09-16 MED ORDER — OMALIZUMAB 150 MG ~~LOC~~ SOLR
300.0000 mg | SUBCUTANEOUS | Status: DC
  Administered 2017-09-15: 300 mg via SUBCUTANEOUS

## 2017-09-16 NOTE — Progress Notes (Signed)
Documentation of medication administration and charges of Xolair have been completed by Keeghan Bialy, CMA based on the Xolair documentation sheet completed by Tammy Scott.  

## 2017-09-17 DIAGNOSIS — R05 Cough: Secondary | ICD-10-CM | POA: Diagnosis not present

## 2017-09-17 DIAGNOSIS — R0602 Shortness of breath: Secondary | ICD-10-CM | POA: Diagnosis not present

## 2017-09-27 ENCOUNTER — Telehealth: Payer: Self-pay | Admitting: Pulmonary Disease

## 2017-09-27 NOTE — Telephone Encounter (Signed)
[name] is aware, nothing further needed.

## 2017-09-27 NOTE — Telephone Encounter (Signed)
Yes, OK to come

## 2017-09-27 NOTE — Telephone Encounter (Signed)
BQ please advise, patient was diagnosed with the flu a week ago on Friday. She has finished her tamiflu and is not showing any signs of symptoms. Is she ok to still come to her visit tomorrow, thanks.

## 2017-09-28 ENCOUNTER — Ambulatory Visit (INDEPENDENT_AMBULATORY_CARE_PROVIDER_SITE_OTHER): Payer: Medicare Other | Admitting: Pulmonary Disease

## 2017-09-28 ENCOUNTER — Encounter: Payer: Self-pay | Admitting: Pulmonary Disease

## 2017-09-28 VITALS — BP 124/68 | HR 76

## 2017-09-28 DIAGNOSIS — J309 Allergic rhinitis, unspecified: Secondary | ICD-10-CM | POA: Diagnosis not present

## 2017-09-28 DIAGNOSIS — J4540 Moderate persistent asthma, uncomplicated: Secondary | ICD-10-CM

## 2017-09-28 NOTE — Progress Notes (Signed)
Subjective:   PATIENT ID: [name] GENDER: female DOB: 10/04/1923, MRN: [id number]  Synopsis:Former patient of Dr. Ashok [name] with asthma who has dementia.  She lives in [place].    HPI  Chief Complaint  Patient presents with  . Follow-up    [name] being treated for asthma.  patient is on Xolair therapy.      [name] has been a patient of [name] for several decades for asthma. She recently had the flu as there was an outbreak at her SNF.  She was treated with Tamiflu. She had a congested cough during that time.  She is better now. She typically has one coughing spell about once a day which she attributes to her asthma. She feels that she has recovered from the flu.  The cough has improved. She doesn't need much albuterol, but her daughter (who provides the history today due to the patient's dementia) says that she thinks that she forgets to use it.  She has an albuterol inhaler easily accessible, but she doesn't use it much.  She takes her controller medicines in the form of nebulized pulmicort and perforomist.   Her daughter has witnessed some wheezing.   She has been coming here for injections of Xolair. She doesn't really get out of breath.  Past Medical History:  Diagnosis Date  . Arthritis   . Asthma   . Depression   . Environmental allergies    mold  . Fall   . Hyperlipidemia   . Hypertension   . Memory loss   . Seasonal allergies      Family History  Problem Relation Age of Onset  . Asthma Unknown   . Colon cancer Father   . Dementia Neg Hx      Social History   Socioeconomic History  . Marital status: Married    Spouse name: Not on file  . Number of children: 1  . Years of education: 40  . Highest education level: Not on file  Social Needs  . Financial resource strain: Not on file  . Food insecurity - worry: Not on file  . Food insecurity - inability: Not on file  . Transportation needs - medical: Not on file  . Transportation  needs - non-medical: Not on file  Occupational History  . Occupation: retired    Fish farm manager: RETIRED  Tobacco Use  . Smoking status: Never Smoker  . Smokeless tobacco: Never Used  Substance and Sexual Activity  . Alcohol use: Yes    Alcohol/week: 0.0 oz    Comment: 1 glass daily  . Drug use: No  . Sexual activity: Not on file  Other Topics Concern  . Not on file  Social History Narrative   Originally from [place]. She previously lived in [place]. 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 No bird exposure. No recent mold exposure. Previous hold did have mold.       Lives at [facility]: skilled nursing area    Caffeine use: 1 cup coffee /day     Allergies  Allergen Reactions  . Azithromycin Other (See Comments)    Nausea, weakness   . Aspirin Other (See Comments)    Unknown reaction  . Sulfonamide Derivatives Other (See Comments)    Unknown reaction     Outpatient Medications Prior to Visit  Medication Sig Dispense Refill  . acetaminophen (TYLENOL) 325 MG tablet Take 650 mg by mouth every 4 (four) hours as needed (pain).    Marland Kitchen  albuterol (PROVENTIL HFA;VENTOLIN HFA) 108 (90 Base) MCG/ACT inhaler Inhale 1-2 puffs into the lungs every 6 (six) hours as needed for wheezing or shortness of breath. 1 Inhaler 6  . albuterol (PROVENTIL) (2.5 MG/3ML) 0.083% nebulizer solution Take 3 mLs (2.5 mg total) by nebulization every 4 (four) hours as needed for wheezing or shortness of breath. 120 mL 3  . budesonide (PULMICORT) 0.5 MG/2ML nebulizer solution Take 2 mLs (0.5 mg total) by nebulization 2 (two) times daily. 60 mL 6  . Calcium Carb-Cholecalciferol (CALCIUM 600-D PO) Take 1 tablet by mouth daily.    . cetirizine (ZYRTEC) 10 MG tablet Take 10 mg by mouth at bedtime.    . Cholecalciferol (VITAMIN D) 2000 units tablet Take 2,000 Units by mouth at bedtime.    Marland Kitchen dextromethorphan (DELSYM) 30 MG/5ML liquid Take 60 mg by mouth every 12 (twelve) hours as needed for cough.    .  dextromethorphan-guaiFENesin (MUCINEX DM) 30-600 MG 12hr tablet Take 1 tablet by mouth 2 (two) times daily as needed for cough (congestion).     . diphenhydramine-acetaminophen (TYLENOL PM) 25-500 MG TABS tablet Take 1 tablet by mouth at bedtime.     . docusate sodium (COLACE) 100 MG capsule Take 100 mg by mouth at bedtime.     . donepezil (ARICEPT) 5 MG tablet Take 0.5 tablets (2.5 mg total) by mouth at bedtime. 30 tablet 8  . FLUoxetine (PROZAC) 20 MG capsule Take 40 mg by mouth daily.     . fluticasone (FLONASE) 50 MCG/ACT nasal spray instill 2 sprays into each nostril once daily (Patient taking differently: Place 2 sprays into both nostrils daily. ) 16 g 6  . formoterol (PERFOROMIST) 20 MCG/2ML nebulizer solution Take 2 mLs (20 mcg total) by nebulization 2 (two) times daily. 60 mL 6  . furosemide (LASIX) 20 MG tablet Take 20 mg by mouth daily.    Marland Kitchen Heat Wraps (THERMACARE BACK/HIP) MISC Place 1 patch onto the skin as needed. Not to be worn more than 8 hours    . ipratropium-albuterol (DUONEB) 0.5-2.5 (3) MG/3ML SOLN Take 3 mLs by nebulization every 4 (four) hours as needed (shortness of breath/wheezing).    . Liniments (SALONPAS PAIN RELIEF PATCH EX) Place 1 patch onto the skin daily as needed (neck pain (remove patch after 12 hours)).    Marland Kitchen memantine (NAMENDA) 10 MG tablet Take 1 tablet (10 mg total) by mouth 2 (two) times daily. 60 tablet 11  . montelukast (SINGULAIR) 10 MG tablet Take 1 tablet (10 mg total) by mouth daily. (Patient taking differently: Take 10 mg by mouth at bedtime. ) 30 tablet 6  . olmesartan (BENICAR) 40 MG tablet Take 40 mg by mouth daily.    . Omalizumab (XOLAIR) 150 MG/ML SOSY Inject 150 mg into the skin every 14 (fourteen) days. By [facility]    . omeprazole (PRILOSEC) 20 MG capsule Take 20 mg by mouth daily before breakfast.    . polyethylene glycol (MIRALAX / GLYCOLAX) packet Take 17 g by mouth daily.    Marland Kitchen spiritus frumenti (ETHYL ALCOHOL) SOLN Take 1 each by  mouth See admin instructions. May have 1-2 glasses of wine every evening(4 oz each) for a total of 8 oz daily    . vitamin B-12 (CYANOCOBALAMIN) 1000 MCG tablet Take 3,000 mcg by mouth daily.     Facility-Administered Medications Prior to Visit  Medication Dose Route Frequency Provider Last Rate Last Dose  . omalizumab Arvid Right) injection 300 mg  300 mg Subcutaneous Q14  Days Juanito Doom, MD   300 mg at 09/15/17 1125    Review of Systems  Constitutional: Negative for chills, diaphoresis and malaise/fatigue.  HENT: Negative for sinus pain and sore throat.   Respiratory: Positive for cough and wheezing. Negative for sputum production and stridor.   Cardiovascular: Negative for chest pain, claudication and leg swelling.      Objective:  Physical Exam   Vitals:   09/28/17 0946  BP: 124/68  Pulse: 76  SpO2: 100%   RA  Gen: chronically ill appearing, in wheelchair HENT: OP clear, TM's clear, neck supple PULM: CTA B, normal percussion CV: RRR, no mgr, trace edema GI: BS+, soft, nontender Derm: no cyanosis or rash Psyche: normal mood and affect    CBC    Component Value Date/Time   WBC 11.7 (H) 07/24/2017 1822   RBC 3.76 (L) 07/24/2017 1822   HGB 11.4 (L) 07/24/2017 1822   HCT 34.9 (L) 07/24/2017 1822   PLT 304 07/24/2017 1822   MCV 92.8 07/24/2017 1822   MCH 30.3 07/24/2017 1822   MCHC 32.7 07/24/2017 1822   RDW 14.5 07/24/2017 1822   LYMPHSABS 1.8 07/24/2017 1822   MONOABS 0.5 07/24/2017 1822   EOSABS 0.6 07/24/2017 1822   BASOSABS 0.0 07/24/2017 1822     PFT 03/29/06: FVC 2.20 L (93%) FEV1 1.45 L (91%) FEV1/FVC 0.66 FEF 25-75 0.74 L (40%) negative bronchodilator response TLC 3.80 L (88%) RV 84% ERV 42% DLCO uncorrected 83%  IMAGING CXR PA/LAT 04/26/17:  No opacity or mass appreciated. No pleural effusion. Heart normal in size & mediastinum normal in contour.  LABS 10/10/8 IgE:  490.7       Assessment & Plan:   Moderate persistent asthma without  complication  Chronic allergic rhinitis  Discussion: [name] has severe persistent asthma with recurrent exacerbations managed with Xolair, montelukast, Zyrtec, Perforomist, Pulmicort.  She has exacerbations from time to time but considering the severity of her disease I think she is doing fairly well.  She does not really exert herself enough to get short of breath.  She does not have the respiratory capacity to be able to take a dry powder inhaler so I agree with her current regimen.  It sounds as if she is well cared for at [facility].  I encouraged her today to ask for her inhaler if she feels that she is wheezing or feels short of breath.  Plan: Severe persistent asthma: Continue Xolair injections as ordered Perforomist and Pulmicort Use albuterol as needed for chest tightness wheezing or shortness of breath Continue Singulair Continue Zyrtec  Allergic rhinitis: Continue Singulair and Zyrtec  Follow up in 4-6 months  14 minutes spent in direct consultation with the patient and her daughter, 27 minutes total on this visit.   Current Outpatient Medications:  .  acetaminophen (TYLENOL) 325 MG tablet, Take 650 mg by mouth every 4 (four) hours as needed (pain)., Disp: , Rfl:  .  albuterol (PROVENTIL HFA;VENTOLIN HFA) 108 (90 Base) MCG/ACT inhaler, Inhale 1-2 puffs into the lungs every 6 (six) hours as needed for wheezing or shortness of breath., Disp: 1 Inhaler, Rfl: 6 .  albuterol (PROVENTIL) (2.5 MG/3ML) 0.083% nebulizer solution, Take 3 mLs (2.5 mg total) by nebulization every 4 (four) hours as needed for wheezing or shortness of breath., Disp: 120 mL, Rfl: 3 .  budesonide (PULMICORT) 0.5 MG/2ML nebulizer solution, Take 2 mLs (0.5 mg total) by nebulization 2 (two) times daily., Disp: 60 mL, Rfl: 6 .  Calcium Carb-Cholecalciferol (CALCIUM 600-D PO), Take 1 tablet by mouth daily., Disp: , Rfl:  .  cetirizine (ZYRTEC) 10 MG tablet, Take 10 mg by mouth at bedtime., Disp: , Rfl:  .   Cholecalciferol (VITAMIN D) 2000 units tablet, Take 2,000 Units by mouth at bedtime., Disp: , Rfl:  .  dextromethorphan (DELSYM) 30 MG/5ML liquid, Take 60 mg by mouth every 12 (twelve) hours as needed for cough., Disp: , Rfl:  .  dextromethorphan-guaiFENesin (MUCINEX DM) 30-600 MG 12hr tablet, Take 1 tablet by mouth 2 (two) times daily as needed for cough (congestion). , Disp: , Rfl:  .  diphenhydramine-acetaminophen (TYLENOL PM) 25-500 MG TABS tablet, Take 1 tablet by mouth at bedtime. , Disp: , Rfl:  .  docusate sodium (COLACE) 100 MG capsule, Take 100 mg by mouth at bedtime. , Disp: , Rfl:  .  donepezil (ARICEPT) 5 MG tablet, Take 0.5 tablets (2.5 mg total) by mouth at bedtime., Disp: 30 tablet, Rfl: 8 .  FLUoxetine (PROZAC) 20 MG capsule, Take 40 mg by mouth daily. , Disp: , Rfl:  .  fluticasone (FLONASE) 50 MCG/ACT nasal spray, instill 2 sprays into each nostril once daily (Patient taking differently: Place 2 sprays into both nostrils daily. ), Disp: 16 g, Rfl: 6 .  formoterol (PERFOROMIST) 20 MCG/2ML nebulizer solution, Take 2 mLs (20 mcg total) by nebulization 2 (two) times daily., Disp: 60 mL, Rfl: 6 .  furosemide (LASIX) 20 MG tablet, Take 20 mg by mouth daily., Disp: , Rfl:  .  Heat Wraps (THERMACARE BACK/HIP) MISC, Place 1 patch onto the skin as needed. Not to be worn more than 8 hours, Disp: , Rfl:  .  ipratropium-albuterol (DUONEB) 0.5-2.5 (3) MG/3ML SOLN, Take 3 mLs by nebulization every 4 (four) hours as needed (shortness of breath/wheezing)., Disp: , Rfl:  .  Liniments (SALONPAS PAIN RELIEF PATCH EX), Place 1 patch onto the skin daily as needed (neck pain (remove patch after 12 hours))., Disp: , Rfl:  .  memantine (NAMENDA) 10 MG tablet, Take 1 tablet (10 mg total) by mouth 2 (two) times daily., Disp: 60 tablet, Rfl: 11 .  montelukast (SINGULAIR) 10 MG tablet, Take 1 tablet (10 mg total) by mouth daily. (Patient taking differently: Take 10 mg by mouth at bedtime. ), Disp: 30 tablet,  Rfl: 6 .  olmesartan (BENICAR) 40 MG tablet, Take 40 mg by mouth daily., Disp: , Rfl:  .  Omalizumab (XOLAIR) 150 MG/ML SOSY, Inject 150 mg into the skin every 14 (fourteen) days. By [facility], Disp: , Rfl:  .  omeprazole (PRILOSEC) 20 MG capsule, Take 20 mg by mouth daily before breakfast., Disp: , Rfl:  .  polyethylene glycol (MIRALAX / GLYCOLAX) packet, Take 17 g by mouth daily., Disp: , Rfl:  .  spiritus frumenti (ETHYL ALCOHOL) SOLN, Take 1 each by mouth See admin instructions. May have 1-2 glasses of wine every evening(4 oz each) for a total of 8 oz daily, Disp: , Rfl:  .  vitamin B-12 (CYANOCOBALAMIN) 1000 MCG tablet, Take 3,000 mcg by mouth daily., Disp: , Rfl:   Current Facility-Administered Medications:  .  omalizumab Arvid Right) injection 300 mg, 300 mg, Subcutaneous, Q14 Days, McQuaid, Douglas B, MD, 300 mg at 09/15/17 1125

## 2017-09-28 NOTE — Patient Instructions (Signed)
Severe persistent asthma: Continue Xolair injections as ordered Perforomist and Pulmicort Use albuterol as needed for chest tightness wheezing or shortness of breath Continue Singulair Continue Zyrtec  Follow up in 4-6 months

## 2017-09-29 ENCOUNTER — Ambulatory Visit (INDEPENDENT_AMBULATORY_CARE_PROVIDER_SITE_OTHER): Payer: Medicare Other

## 2017-09-29 DIAGNOSIS — J4540 Moderate persistent asthma, uncomplicated: Secondary | ICD-10-CM | POA: Diagnosis not present

## 2017-09-30 MED ORDER — OMALIZUMAB 150 MG ~~LOC~~ SOLR
300.0000 mg | SUBCUTANEOUS | Status: DC
  Administered 2017-09-29: 300 mg via SUBCUTANEOUS

## 2017-09-30 NOTE — Progress Notes (Signed)
Documentation of medication administration and charges of Xolair have been completed by Lindsay Lemons, CMA based on the Xolair documentation sheet completed by Tammy Scott.  

## 2017-10-06 ENCOUNTER — Telehealth: Payer: Self-pay | Admitting: Pulmonary Disease

## 2017-10-06 NOTE — Telephone Encounter (Signed)
Prefilled Syringe: #150mg  4  #75mg  0 Ordered Date: 10/06/2017 Shipping Date: 10/06/2017

## 2017-10-07 NOTE — Telephone Encounter (Signed)
Prefilled Syringes: # 150mg  4  #75mg  n/a Arrival Date:10/07/2017 Lot #: 150mg  [id number]      75mg  N/a Exp Date: 150mg  07/2018   75mg  n/a

## 2017-10-13 ENCOUNTER — Ambulatory Visit (INDEPENDENT_AMBULATORY_CARE_PROVIDER_SITE_OTHER): Payer: Medicare Other

## 2017-10-13 DIAGNOSIS — J4540 Moderate persistent asthma, uncomplicated: Secondary | ICD-10-CM | POA: Diagnosis not present

## 2017-10-14 MED ORDER — OMALIZUMAB 150 MG ~~LOC~~ SOLR
300.0000 mg | SUBCUTANEOUS | Status: DC
  Administered 2017-10-13: 300 mg via SUBCUTANEOUS

## 2017-10-14 NOTE — Progress Notes (Signed)
Documentation of medication administration and charges of Xolair have been completed by Lindsay Lemons, CMA based on the Xolair documentation sheet completed by Tammy Scott.  

## 2017-10-22 ENCOUNTER — Telehealth: Payer: Self-pay | Admitting: Pulmonary Disease

## 2017-10-22 NOTE — Telephone Encounter (Signed)
LMTCB

## 2017-10-25 NOTE — Telephone Encounter (Signed)
lmtcb x2 

## 2017-10-26 NOTE — Telephone Encounter (Signed)
Spoke with Misty at [facility]. States that the pt is has not been feeling well. Reports increased coughing and SOB. Cough produces mucus but Misty is unsure of the color due to the pt not spitting the mucus out. Denies chest tightness, wheezing or fever. Misty would Dr. Anastasia Pall recommendations.  Dr. Lake Bells - please advise. Thanks!

## 2017-10-26 NOTE — Telephone Encounter (Signed)
Spoke with nurse at [facility] and gave verbal Rx over the phone. Nothing further is needed.

## 2017-10-26 NOTE — Telephone Encounter (Signed)
Prednisone 20mg daily x 5 days

## 2017-10-27 ENCOUNTER — Ambulatory Visit (INDEPENDENT_AMBULATORY_CARE_PROVIDER_SITE_OTHER): Payer: Medicare Other

## 2017-10-27 DIAGNOSIS — J4540 Moderate persistent asthma, uncomplicated: Secondary | ICD-10-CM | POA: Diagnosis not present

## 2017-10-28 MED ORDER — OMALIZUMAB 150 MG ~~LOC~~ SOLR
300.0000 mg | Freq: Once | SUBCUTANEOUS | Status: AC
  Administered 2017-10-27: 300 mg via SUBCUTANEOUS

## 2017-11-08 ENCOUNTER — Telehealth: Payer: Self-pay | Admitting: Pulmonary Disease

## 2017-11-08 NOTE — Telephone Encounter (Signed)
Prefilled Syringe: #150mg  4  #75mg  0 Ordered Date: 11/08/2017 Shipping Date: 11/08/2017

## 2017-11-09 NOTE — Telephone Encounter (Signed)
Prefilled Syringes: # 150mg  4  #75mg  0 Arrival Date:11/09/2017 Lot #: 150mg  [id number]      75mg  Exp Date: 150mg  12/19   75mg 

## 2017-11-10 ENCOUNTER — Ambulatory Visit (INDEPENDENT_AMBULATORY_CARE_PROVIDER_SITE_OTHER): Payer: Medicare Other

## 2017-11-10 DIAGNOSIS — J4540 Moderate persistent asthma, uncomplicated: Secondary | ICD-10-CM

## 2017-11-11 MED ORDER — OMALIZUMAB 150 MG ~~LOC~~ SOLR
300.0000 mg | SUBCUTANEOUS | Status: DC
  Administered 2017-11-10: 300 mg via SUBCUTANEOUS

## 2017-11-11 NOTE — Progress Notes (Signed)
Documentation of medication administration and charges of Xolair have been completed by Desmond Dike, CMA based on the Xolair documentation sheet completed by Maury Dus, RMA.

## 2017-11-17 ENCOUNTER — Telehealth: Payer: Self-pay | Admitting: Pulmonary Disease

## 2017-11-17 NOTE — Telephone Encounter (Signed)
Called and spoke with Len Blalock, LPN and she said a verbal order of this was fine. They will send the prescription to their in house pharmacy. Nothing further needed.

## 2017-11-17 NOTE — Telephone Encounter (Signed)
Called and spoke with [facility] she states that patient has had wheezing for about two weeks now along with a dry cough for two days. Her oxygen is at 98% and she is doing her scheduled nebulizer. They are asking that we send in prednisone and this has helped before.    BQ please advise, thanks.

## 2017-11-17 NOTE — Telephone Encounter (Signed)
Prednisone 20mg daily x 5 days

## 2017-11-24 ENCOUNTER — Telehealth: Payer: Self-pay | Admitting: Pulmonary Disease

## 2017-11-24 ENCOUNTER — Ambulatory Visit (INDEPENDENT_AMBULATORY_CARE_PROVIDER_SITE_OTHER): Payer: Medicare Other

## 2017-11-24 DIAGNOSIS — J4540 Moderate persistent asthma, uncomplicated: Secondary | ICD-10-CM

## 2017-11-24 NOTE — Telephone Encounter (Signed)
Misty/[facility] returning [place] call, CB is [phone].

## 2017-11-24 NOTE — Telephone Encounter (Signed)
TS has pt in injection appt rom- DOD question. Pt Lives at [facility] saw the Dr. there last wk.  He put her on Prednisone and may have been on an abx.  TS have a call in the nurse of pt's unit. Is is okay to have Xolair injection today.   Routing message to VS to advise

## 2017-11-24 NOTE — Telephone Encounter (Signed)
Spoke with Misty pt. Was not put on an abx. Come to find out she called and ordered pred. From [place].

## 2017-11-24 NOTE — Telephone Encounter (Signed)
Okay to give xolair.

## 2017-11-25 NOTE — Telephone Encounter (Signed)
Misty Lobbyist) is concern Eda Magnussen has had have pred once a month since Jan. Of this year. I told her I would let you know. A reminder she takes xolair 300mg  q 2 wks.Marland Kitchen

## 2017-11-26 NOTE — Telephone Encounter (Signed)
Please schedule for ROV with Dr. Lake Bells or Nurse Practitioner.

## 2017-11-26 NOTE — Telephone Encounter (Signed)
Routing message to VS for review  Misty Lobbyist) is concern Lashonne Shull has had have pred once a month since Jan. Of this year. I told her I would let you know. A reminder she takes xolair 300mg  q 2 wks..  VS please advise

## 2017-11-29 NOTE — Telephone Encounter (Signed)
Called and spoke with [facility] from [facility]. I advised her of VS response. Patient has an appointment on 5.8.19 for her injection with TS. I have scheduled patient to see TP on 5.8.19 for a same day appointment.

## 2017-11-30 DIAGNOSIS — I10 Essential (primary) hypertension: Secondary | ICD-10-CM | POA: Diagnosis not present

## 2017-11-30 DIAGNOSIS — E538 Deficiency of other specified B group vitamins: Secondary | ICD-10-CM | POA: Diagnosis not present

## 2017-11-30 DIAGNOSIS — R5381 Other malaise: Secondary | ICD-10-CM | POA: Diagnosis not present

## 2017-11-30 DIAGNOSIS — N183 Chronic kidney disease, stage 3 (moderate): Secondary | ICD-10-CM | POA: Diagnosis not present

## 2017-11-30 DIAGNOSIS — J4540 Moderate persistent asthma, uncomplicated: Secondary | ICD-10-CM | POA: Diagnosis not present

## 2017-11-30 DIAGNOSIS — M810 Age-related osteoporosis without current pathological fracture: Secondary | ICD-10-CM | POA: Diagnosis not present

## 2017-11-30 DIAGNOSIS — I129 Hypertensive chronic kidney disease with stage 1 through stage 4 chronic kidney disease, or unspecified chronic kidney disease: Secondary | ICD-10-CM | POA: Diagnosis not present

## 2017-11-30 DIAGNOSIS — E559 Vitamin D deficiency, unspecified: Secondary | ICD-10-CM | POA: Diagnosis not present

## 2017-11-30 DIAGNOSIS — R32 Unspecified urinary incontinence: Secondary | ICD-10-CM | POA: Diagnosis not present

## 2017-11-30 DIAGNOSIS — R413 Other amnesia: Secondary | ICD-10-CM | POA: Diagnosis not present

## 2017-12-01 ENCOUNTER — Telehealth: Payer: Self-pay | Admitting: Pulmonary Disease

## 2017-12-01 NOTE — Telephone Encounter (Signed)
Prefilled Syringe: #150mg  4  #75mg  0 Ordered Date: 12/01/2017 Shipping Date: 12/01/2017

## 2017-12-02 NOTE — Telephone Encounter (Signed)
Prefilled Syringes: # 150mg  4  #75mg  0  Arrival Date: 12/02/2017 Lot #: 150mg  [id number]      75mg  0  Exp Date: 150mg  07/2018   75mg  0

## 2017-12-03 ENCOUNTER — Telehealth: Payer: Self-pay | Admitting: Pulmonary Disease

## 2017-12-03 NOTE — Telephone Encounter (Signed)
Spoke with Financial controller at [facility], states that pt c/o increased sob, wheezing, prod cough with clear/white mucus X3-4 days.  No fever, chest pain, unusual fatigue, lethargy.  S/s improved with deep coughs and neb treatments. Pt has an OV on 5/8 with TP, but Misty is wanting to know of recs in the meantime.    BQ please advise.  Thanks!

## 2017-12-03 NOTE — Telephone Encounter (Signed)
Prednisone 20mg daily x 5 days

## 2017-12-03 NOTE — Telephone Encounter (Signed)
Spoke with nurse at [facility], aware of recs.  Nothing further needed.

## 2017-12-03 NOTE — Telephone Encounter (Signed)
TP is aware of pt's pending appt and Rx Routing to triage for resolution

## 2017-12-07 DIAGNOSIS — G301 Alzheimer's disease with late onset: Secondary | ICD-10-CM | POA: Diagnosis not present

## 2017-12-07 DIAGNOSIS — R5381 Other malaise: Secondary | ICD-10-CM | POA: Diagnosis not present

## 2017-12-07 DIAGNOSIS — M62562 Muscle wasting and atrophy, not elsewhere classified, left lower leg: Secondary | ICD-10-CM | POA: Diagnosis not present

## 2017-12-07 DIAGNOSIS — M62561 Muscle wasting and atrophy, not elsewhere classified, right lower leg: Secondary | ICD-10-CM | POA: Diagnosis not present

## 2017-12-07 MED ORDER — OMALIZUMAB 150 MG ~~LOC~~ SOLR
300.0000 mg | SUBCUTANEOUS | Status: DC
  Administered 2017-11-24: 300 mg via SUBCUTANEOUS

## 2017-12-07 NOTE — Progress Notes (Signed)
Documentation of medication administration and charges of Xolair have been completed by Lindsay Lemons, CMA based on the Xolair documentation sheet completed by Tammy Scott.  

## 2017-12-08 ENCOUNTER — Ambulatory Visit (INDEPENDENT_AMBULATORY_CARE_PROVIDER_SITE_OTHER): Payer: Medicare Other | Admitting: Adult Health

## 2017-12-08 ENCOUNTER — Ambulatory Visit (INDEPENDENT_AMBULATORY_CARE_PROVIDER_SITE_OTHER): Payer: Medicare Other

## 2017-12-08 ENCOUNTER — Encounter: Payer: Self-pay | Admitting: Adult Health

## 2017-12-08 DIAGNOSIS — J302 Other seasonal allergic rhinitis: Secondary | ICD-10-CM

## 2017-12-08 DIAGNOSIS — J4541 Moderate persistent asthma with (acute) exacerbation: Secondary | ICD-10-CM

## 2017-12-08 DIAGNOSIS — J4540 Moderate persistent asthma, uncomplicated: Secondary | ICD-10-CM | POA: Diagnosis not present

## 2017-12-08 NOTE — Progress Notes (Signed)
Reviewed, agree 

## 2017-12-08 NOTE — Assessment & Plan Note (Signed)
Recent flare now resolved  Plan  Continue on Pulmicort and Perforomist Continue on Singulair Continue on Zyrtec Albuterol as needed Continue on Xolair

## 2017-12-08 NOTE — Assessment & Plan Note (Signed)
Continue on trigger prevention.  Continue on current regimen

## 2017-12-08 NOTE — Progress Notes (Signed)
@Patient  ID: [name], female    DOB: 04-12-1924, 84 y.o.   MRN: [id number]  Chief Complaint  Patient presents with  . Follow-up    Referring provider: Tisovec, Fransico Him, MD  HPI: 84 year old female Nursing home pt never smoker followed for Asthma  PMH : Dementia    12/08/2017 Follow up: Asthma  Patient presents for a follow-up for her asthma.  Last week patient had increased cough and wheezing.  She was called in prednisone 20 mg for 5 days.  Patient says she is feeling better with resolution of wheezing . Cough is better. No fever, discolored mucus , increased edema or orthopnea.  Patient remains on Pulmicort and Perforomist nebulizers twice daily, Xolair injections.  , singulair and zyrtec.  She is with caregiver, unsure if she has used any albuterol or not .   Allergies  Allergen Reactions  . Azithromycin Other (See Comments)    Nausea, weakness   . Aspirin Other (See Comments)    Unknown reaction  . Sulfonamide Derivatives Other (See Comments)    Unknown reaction    Immunization History  Administered Date(s) Administered  . Influenza Split 04/04/2012, 05/03/2013, 05/10/2015  . Influenza Whole 04/18/2008, 06/04/2009, 04/03/2010, 03/30/2011  . Influenza,inj,Quad PF,6+ Mos 03/26/2017  . Influenza-Unspecified 06/03/2014, 04/27/2016  . Pneumococcal Conjugate-13 03/05/2014  . Pneumococcal Polysaccharide-23 06/04/2009    Past Medical History:  Diagnosis Date  . Arthritis   . Asthma   . Depression   . Environmental allergies    mold  . Fall   . Hyperlipidemia   . Hypertension   . Memory loss   . Seasonal allergies     Tobacco History: Social History   Tobacco Use  Smoking Status Never Smoker  Smokeless Tobacco Never Used   Counseling given: Not Answered   Outpatient Encounter Medications as of 12/08/2017  Medication Sig  . acetaminophen (TYLENOL) 325 MG tablet Take 650 mg by mouth every 4 (four) hours as needed (pain).  Marland Kitchen albuterol (PROVENTIL  HFA;VENTOLIN HFA) 108 (90 Base) MCG/ACT inhaler Inhale 1-2 puffs into the lungs every 6 (six) hours as needed for wheezing or shortness of breath.  Marland Kitchen albuterol (PROVENTIL) (2.5 MG/3ML) 0.083% nebulizer solution Take 3 mLs (2.5 mg total) by nebulization every 4 (four) hours as needed for wheezing or shortness of breath.  . budesonide (PULMICORT) 0.5 MG/2ML nebulizer solution Take 2 mLs (0.5 mg total) by nebulization 2 (two) times daily.  . Calcium Carb-Cholecalciferol (CALCIUM 600-D PO) Take 1 tablet by mouth daily.  . cetirizine (ZYRTEC) 10 MG tablet Take 10 mg by mouth at bedtime.  . Cholecalciferol (VITAMIN D) 2000 units tablet Take 2,000 Units by mouth at bedtime.  Marland Kitchen dextromethorphan (DELSYM) 30 MG/5ML liquid Take 60 mg by mouth every 12 (twelve) hours as needed for cough.  . dextromethorphan-guaiFENesin (MUCINEX DM) 30-600 MG 12hr tablet Take 1 tablet by mouth 2 (two) times daily as needed for cough (congestion).   . diphenhydramine-acetaminophen (TYLENOL PM) 25-500 MG TABS tablet Take 1 tablet by mouth at bedtime.   . docusate sodium (COLACE) 100 MG capsule Take 100 mg by mouth at bedtime.   . donepezil (ARICEPT) 5 MG tablet Take 0.5 tablets (2.5 mg total) by mouth at bedtime.  Marland Kitchen FLUoxetine (PROZAC) 20 MG capsule Take 40 mg by mouth daily.   . fluticasone (FLONASE) 50 MCG/ACT nasal spray instill 2 sprays into each nostril once daily (Patient taking differently: Place 2 sprays into both nostrils daily. )  . formoterol (PERFOROMIST) 20  MCG/2ML nebulizer solution Take 2 mLs (20 mcg total) by nebulization 2 (two) times daily.  . furosemide (LASIX) 20 MG tablet Take 20 mg by mouth daily.  Marland Kitchen Heat Wraps (THERMACARE BACK/HIP) MISC Place 1 patch onto the skin as needed. Not to be worn more than 8 hours  . ipratropium-albuterol (DUONEB) 0.5-2.5 (3) MG/3ML SOLN Take 3 mLs by nebulization every 4 (four) hours as needed (shortness of breath/wheezing).  . Liniments (SALONPAS PAIN RELIEF PATCH EX) Place 1  patch onto the skin daily as needed (neck pain (remove patch after 12 hours)).  Marland Kitchen memantine (NAMENDA) 10 MG tablet Take 1 tablet (10 mg total) by mouth 2 (two) times daily.  . montelukast (SINGULAIR) 10 MG tablet Take 1 tablet (10 mg total) by mouth daily. (Patient taking differently: Take 10 mg by mouth at bedtime. )  . olmesartan (BENICAR) 40 MG tablet Take 40 mg by mouth daily.  Marland Kitchen omeprazole (PRILOSEC) 20 MG capsule Take 20 mg by mouth daily before breakfast.  . polyethylene glycol (MIRALAX / GLYCOLAX) packet Take 17 g by mouth daily.  Marland Kitchen spiritus frumenti (ETHYL ALCOHOL) SOLN Take 1 each by mouth See admin instructions. May have 1-2 glasses of wine every evening(4 oz each) for a total of 8 oz daily  . vitamin B-12 (CYANOCOBALAMIN) 1000 MCG tablet Take 3,000 mcg by mouth daily.  . [DISCONTINUED] rivaroxaban (XARELTO) 10 MG TABS tablet Take 1 tablet (10 mg total) by mouth daily.   Facility-Administered Encounter Medications as of 12/08/2017  Medication  . omalizumab Arvid Right) injection 300 mg  . omalizumab Arvid Right) injection 300 mg     Review of Systems  Constitutional:   No  weight loss, night sweats,  Fevers, chills, fatigue, or  lassitude.  HEENT:   No headaches,  Difficulty swallowing,  Tooth/dental problems, or  Sore throat,                No sneezing, itching, ear ache, nasal congestion, post nasal drip,   CV:  No chest pain,  Orthopnea, PND, swelling in lower extremities, anasarca, dizziness, palpitations, syncope.   GI  No heartburn, indigestion, abdominal pain, nausea, vomiting, diarrhea, change in bowel habits, loss of appetite, bloody stools.   Resp:    No chest wall deformity  Skin: no rash or lesions.  GU: no dysuria, change in color of urine, no urgency or frequency.  No flank pain, no hematuria   MS:  No joint pain or swelling.  No decreased range of motion.  No back pain.    Physical Exam  BP 126/72 (BP Location: Right Arm, Cuff Size: Normal)   Pulse 65   Ht 5'  5.5" (1.664 m)   Wt 150 lb (68 kg) Comment: patient weighed at [facility] this am  SpO2 100%   BMI 24.58 kg/m   GEN: A/Ox3; pleasant , NAD, elderly in wc    HEENT:  [place]/AT,  EACs-clear, TMs-wnl, NOSE-clear, THROAT-clear, no lesions, no postnasal drip or exudate noted.   NECK:  Supple w/ fair ROM; no JVD; normal carotid impulses w/o bruits; no thyromegaly or nodules palpated; no lymphadenopathy.    RESP  Clear  P & A; w/o, wheezes/ rales/ or rhonchi. no accessory muscle use, no dullness to percussion  CARD:  RRR, no m/r/g,tr  peripheral edema, pulses intact, no cyanosis or clubbing.  GI:   Soft & nt; nml bowel sounds; no organomegaly or masses detected.   Musco: Warm bil, no deformities or joint swelling noted.   Neuro: alert,  no focal deficits noted.    Skin: Warm, no lesions or rashes    Lab Results:  CBC   BMET  BNP No results found for: BNP  ProBNP No results found for: PROBNP  Imaging: No results found.   Assessment & Plan:   Moderate persistent asthma Recent flare now resolved  Plan  Continue on Pulmicort and Perforomist Continue on Singulair Continue on Zyrtec Albuterol as needed Continue on Xolair   Allergic rhinitis Continue on trigger prevention.  Continue on current regimen     Rexene Edison, NP 12/08/2017

## 2017-12-08 NOTE — Patient Instructions (Signed)
Continue on current regimen  Follow up with Dr. Lake Bells in 3 months and As needed

## 2017-12-09 MED ORDER — OMALIZUMAB 150 MG ~~LOC~~ SOLR
300.0000 mg | Freq: Once | SUBCUTANEOUS | Status: AC
  Administered 2017-12-08: 300 mg via SUBCUTANEOUS

## 2017-12-14 DIAGNOSIS — Q6689 Other  specified congenital deformities of feet: Secondary | ICD-10-CM | POA: Diagnosis not present

## 2017-12-14 DIAGNOSIS — L602 Onychogryphosis: Secondary | ICD-10-CM | POA: Diagnosis not present

## 2017-12-22 ENCOUNTER — Ambulatory Visit (INDEPENDENT_AMBULATORY_CARE_PROVIDER_SITE_OTHER): Payer: Medicare Other

## 2017-12-22 ENCOUNTER — Telehealth: Payer: Self-pay | Admitting: Pulmonary Disease

## 2017-12-22 DIAGNOSIS — J4540 Moderate persistent asthma, uncomplicated: Secondary | ICD-10-CM | POA: Diagnosis not present

## 2017-12-22 NOTE — Telephone Encounter (Signed)
Spoke with [employer] at [name] She is aware of Dr. Anastasia Pall recommendation. They will fill this prescription at their in house pharmacy. Nothing further was needed.

## 2017-12-22 NOTE — Telephone Encounter (Signed)
Spoke with pt's caregiver  She reports increased wheezing over the past 2 days  She states this morning you can hear her wheezing across the room  She denies any cough, chest tightness, fever, increased SOB or other co's  She states duoneb only helps minimally, and she feels pred is needed at this point  Please advise, thanks!  Allergies  Allergen Reactions  . Azithromycin Other (See Comments)    Nausea, weakness   . Aspirin Other (See Comments)    Unknown reaction  . Sulfonamide Derivatives Other (See Comments)    Unknown reaction

## 2017-12-22 NOTE — Telephone Encounter (Signed)
Prednisone 20mg daily x 5 days

## 2017-12-23 MED ORDER — OMALIZUMAB 150 MG ~~LOC~~ SOLR
300.0000 mg | SUBCUTANEOUS | Status: DC
  Administered 2017-12-22: 300 mg via SUBCUTANEOUS

## 2017-12-23 NOTE — Progress Notes (Signed)
Documentation of medication administration and charges of Xolair have been completed by Lindsay Lemons, CMA based on the Xolair documentation sheet completed by Tammy Scott.  

## 2018-01-04 ENCOUNTER — Telehealth: Payer: Self-pay | Admitting: Pulmonary Disease

## 2018-01-05 ENCOUNTER — Ambulatory Visit (INDEPENDENT_AMBULATORY_CARE_PROVIDER_SITE_OTHER): Payer: Medicare Other

## 2018-01-05 ENCOUNTER — Telehealth: Payer: Self-pay | Admitting: Pulmonary Disease

## 2018-01-05 DIAGNOSIS — J4540 Moderate persistent asthma, uncomplicated: Secondary | ICD-10-CM | POA: Diagnosis not present

## 2018-01-05 NOTE — Telephone Encounter (Signed)
Prefilled Syringes: # 150mg  4  #75mg  0 Arrival Date: 01/05/18 Lot #: 150mg  [id number]      75mg  0 Exp Date: 150mg  07/2018   75mg  0

## 2018-01-05 NOTE — Telephone Encounter (Signed)
Prefilled Syringe: #150mg  4  #75mg  0 Ordered Date: 01/04/18 Shipping Date: 01/04/18

## 2018-01-05 NOTE — Telephone Encounter (Signed)
Error

## 2018-01-05 NOTE — Telephone Encounter (Signed)
error 

## 2018-01-06 MED ORDER — OMALIZUMAB 150 MG ~~LOC~~ SOLR
300.0000 mg | SUBCUTANEOUS | Status: DC
  Administered 2018-01-05: 300 mg via SUBCUTANEOUS

## 2018-01-06 NOTE — Progress Notes (Signed)
Documentation of medication administration and charges of Xolair have been completed by Lindsay Lemons, CMA based on the Xolair documentation sheet completed by Tammy Scott.  

## 2018-01-10 ENCOUNTER — Telehealth: Payer: Self-pay | Admitting: Pulmonary Disease

## 2018-01-10 MED ORDER — PREDNISONE 10 MG PO TABS: ORAL_TABLET | ORAL | 0 refills | Status: DC

## 2018-01-10 NOTE — Telephone Encounter (Signed)
If she is sick now then call in prednisone 20mg  daily x 5 days If not then needs OV

## 2018-01-10 NOTE — Telephone Encounter (Signed)
Spoke with [place]. She verbalized understanding. She took the verbal for the prednisone and will this into the pharmacy that [facility] uses.   Nothing else needed at time of call.

## 2018-01-10 NOTE — Telephone Encounter (Signed)
Spoke with [employer]. She stated that the patient has been coughing and wheezing every since she finished her last RX for prednisone (per her chart, this was back in 2018). She stated that she believes the patient will need to be on a maintenance dose of prednisone to manage her symptoms. Denies any other symptoms.    RX will need to be sent to [facility].   BQ, please advise. Thanks!

## 2018-01-19 ENCOUNTER — Ambulatory Visit (INDEPENDENT_AMBULATORY_CARE_PROVIDER_SITE_OTHER): Payer: Medicare Other

## 2018-01-19 DIAGNOSIS — J4540 Moderate persistent asthma, uncomplicated: Secondary | ICD-10-CM

## 2018-01-21 MED ORDER — OMALIZUMAB 150 MG ~~LOC~~ SOLR
300.0000 mg | Freq: Once | SUBCUTANEOUS | Status: AC
  Administered 2018-01-19: 300 mg via SUBCUTANEOUS

## 2018-01-25 DIAGNOSIS — L298 Other pruritus: Secondary | ICD-10-CM | POA: Diagnosis not present

## 2018-01-25 DIAGNOSIS — D2239 Melanocytic nevi of other parts of face: Secondary | ICD-10-CM | POA: Diagnosis not present

## 2018-01-25 DIAGNOSIS — Z85828 Personal history of other malignant neoplasm of skin: Secondary | ICD-10-CM | POA: Diagnosis not present

## 2018-01-25 DIAGNOSIS — D225 Melanocytic nevi of trunk: Secondary | ICD-10-CM | POA: Diagnosis not present

## 2018-01-25 DIAGNOSIS — L821 Other seborrheic keratosis: Secondary | ICD-10-CM | POA: Diagnosis not present

## 2018-01-26 ENCOUNTER — Telehealth: Payer: Self-pay | Admitting: Pulmonary Disease

## 2018-01-27 ENCOUNTER — Telehealth: Payer: Self-pay | Admitting: Pulmonary Disease

## 2018-01-27 NOTE — Telephone Encounter (Signed)
Prefilled Syringe: #150mg  4  #75mg  0 Ordered Date: 01/26/18 Shipping Date: 01/26/18

## 2018-01-27 NOTE — Telephone Encounter (Signed)
Error

## 2018-01-31 ENCOUNTER — Telehealth: Payer: Self-pay | Admitting: Pulmonary Disease

## 2018-01-31 NOTE — Telephone Encounter (Signed)
Spoke with [employer] at [name] She stated that the patient developed a non-productive cough and increased wheezing late last week. Per [place], patient does not have a fever and denied being SOB. Her O2 level has been running between 92%-95%.  Misty wishes to have something called in for the patient. Will need to call [facility] to call in medication once approved by BQ.   BQ, please advise. Thanks!

## 2018-01-31 NOTE — Telephone Encounter (Signed)
Called and spoke to [place] with [facility] and relayed below message.  Misty stated that pt is scheduled for injection on 02/02/18 at 11:00 at our office, and requested that apt be made same day.  OV has been made with Wyn Quaker on 02/02/18 at 9:30a. Misty is aware that there may be wait in between visit and injection.  Misty voiced her understanding and had no further questions. Nothing further is needed.

## 2018-01-31 NOTE — Telephone Encounter (Signed)
Needs OV, as this has happened two other times since the last visit.

## 2018-02-01 NOTE — Progress Notes (Signed)
@Patient  ID: [name], female    DOB: Jun 30, 1924, 84 y.o.   MRN: [id number]  Chief Complaint  Patient presents with  . Acute Visit    Increased wheezing, productive cough with discolored mucous.     Referring provider: Tisovec, Fransico Him, MD  HPI: 85 year old female Nursing home pt never smoker followed for Asthma. Xolair injections. Pt of Dr. Lake Bells.   PMH : Dementia   Recent [facility] Encounters:   12/08/17 - TP - OV  Plan: Continue on Pulmicort and Perforomist, Continue on Singulair, Continue on Zyrtec, Albuterol as needed, Continue on Xolair  May/19 and June/19-telephone encounter-treated with prednisone    02/02/18 Acute 84 year old patient Dr. Lake Bells.  With 4 days of wheezing.  Productive cough, unfortunately patient is mucus so unsure of color.  Patient and [facility] staff with patient did not recent fevers.  Patient has known dementia is a poor historian regarding her symptoms.  [facility] staff with patient, report patient has seen more fatigued as well as shortness of breath with exertion.   Patient presenting today to also get her Xolair injection.  Clinical staff with patient report she has been adherent to medications.     Allergies  Allergen Reactions  . Azithromycin Other (See Comments)    Nausea, weakness   . Aspirin Other (See Comments)    Unknown reaction  . Sulfonamide Derivatives Other (See Comments)    Unknown reaction    Immunization History  Administered Date(s) Administered  . Influenza Split 04/04/2012, 05/03/2013, 05/10/2015  . Influenza Whole 04/18/2008, 06/04/2009, 04/03/2010, 03/30/2011  . Influenza,inj,Quad PF,6+ Mos 03/26/2017  . Influenza-Unspecified 06/03/2014, 04/27/2016  . Pneumococcal Conjugate-13 03/05/2014  . Pneumococcal Polysaccharide-23 06/04/2009    Past Medical History:  Diagnosis Date  . Arthritis   . Asthma   . Depression   . Environmental allergies    mold  . Fall   . Hyperlipidemia   .  Hypertension   . Memory loss   . Seasonal allergies     Tobacco History: Social History   Tobacco Use  Smoking Status Never Smoker  Smokeless Tobacco Never Used   Counseling given: Not Answered Continue not smoking   Outpatient Encounter Medications as of 02/02/2018  Medication Sig  . acetaminophen (TYLENOL) 325 MG tablet Take 650 mg by mouth every 4 (four) hours as needed (pain).  Marland Kitchen albuterol (PROVENTIL HFA;VENTOLIN HFA) 108 (90 Base) MCG/ACT inhaler Inhale 1-2 puffs into the lungs every 6 (six) hours as needed for wheezing or shortness of breath.  Marland Kitchen albuterol (PROVENTIL) (2.5 MG/3ML) 0.083% nebulizer solution Take 3 mLs (2.5 mg total) by nebulization every 4 (four) hours as needed for wheezing or shortness of breath.  . budesonide (PULMICORT) 0.5 MG/2ML nebulizer solution Take 2 mLs (0.5 mg total) by nebulization 2 (two) times daily.  . Calcium Carb-Cholecalciferol (CALCIUM 600-D PO) Take 1 tablet by mouth daily.  . cetirizine (ZYRTEC) 10 MG tablet Take 10 mg by mouth at bedtime.  . Cholecalciferol (VITAMIN D) 2000 units tablet Take 2,000 Units by mouth at bedtime.  Marland Kitchen dextromethorphan (DELSYM) 30 MG/5ML liquid Take 60 mg by mouth every 12 (twelve) hours as needed for cough.  . dextromethorphan-guaiFENesin (MUCINEX DM) 30-600 MG 12hr tablet Take 1 tablet by mouth 2 (two) times daily as needed for cough (congestion).   . diphenhydramine-acetaminophen (TYLENOL PM) 25-500 MG TABS tablet Take 1 tablet by mouth at bedtime.   . docusate sodium (COLACE) 100 MG capsule Take 100 mg by mouth at bedtime.   Marland Kitchen  FLUoxetine (PROZAC) 20 MG capsule Take 40 mg by mouth daily.   . fluticasone (FLONASE) 50 MCG/ACT nasal spray instill 2 sprays into each nostril once daily (Patient taking differently: Place 2 sprays into both nostrils daily. )  . formoterol (PERFOROMIST) 20 MCG/2ML nebulizer solution Take 2 mLs (20 mcg total) by nebulization 2 (two) times daily.  . furosemide (LASIX) 20 MG tablet Take 20 mg  by mouth daily.  Marland Kitchen Heat Wraps (THERMACARE BACK/HIP) MISC Place 1 patch onto the skin as needed. Not to be worn more than 8 hours  . ipratropium-albuterol (DUONEB) 0.5-2.5 (3) MG/3ML SOLN Take 3 mLs by nebulization every 4 (four) hours as needed (shortness of breath/wheezing).  . Liniments (SALONPAS PAIN RELIEF PATCH EX) Place 1 patch onto the skin daily as needed (neck pain (remove patch after 12 hours)).  Marland Kitchen memantine (NAMENDA) 10 MG tablet Take 1 tablet (10 mg total) by mouth 2 (two) times daily.  . montelukast (SINGULAIR) 10 MG tablet Take 1 tablet (10 mg total) by mouth daily. (Patient taking differently: Take 10 mg by mouth at bedtime. )  . olmesartan (BENICAR) 40 MG tablet Take 40 mg by mouth daily.  Marland Kitchen omeprazole (PRILOSEC) 20 MG capsule Take 20 mg by mouth daily before breakfast.  . polyethylene glycol (MIRALAX / GLYCOLAX) packet Take 17 g by mouth daily.  Marland Kitchen spiritus frumenti (ETHYL ALCOHOL) SOLN Take 1 each by mouth See admin instructions. May have 1-2 glasses of wine every evening(4 oz each) for a total of 8 oz daily  . vitamin B-12 (CYANOCOBALAMIN) 1000 MCG tablet Take 3,000 mcg by mouth daily.  . [DISCONTINUED] predniSONE (DELTASONE) 10 MG tablet Take 2 tablets daily for 5 days  . donepezil (ARICEPT) 5 MG tablet Take 0.5 tablets (2.5 mg total) by mouth at bedtime. (Patient not taking: Reported on 02/02/2018)  . predniSONE (DELTASONE) 10 MG tablet Take 2 tablets daily for 5 days (Patient not taking: Reported on 02/02/2018)  . [DISCONTINUED] rivaroxaban (XARELTO) 10 MG TABS tablet Take 1 tablet (10 mg total) by mouth daily.   Facility-Administered Encounter Medications as of 02/02/2018  Medication  . [COMPLETED] levalbuterol (XOPENEX) nebulizer solution 0.63 mg  . omalizumab Arvid Right) injection 300 mg  . omalizumab Arvid Right) injection 300 mg  . omalizumab Arvid Right) injection 300 mg  . omalizumab Arvid Right) injection 300 mg     Review of Systems  Constitutional: +fatigue, weight gain over  past few months   No  weight loss, night sweats,  fevers, chills HEENT:   No headaches,  Difficulty swallowing,  Tooth/dental problems, or  Sore throat, No sneezing, itching, ear ache, nasal congestion, post nasal drip  CV: +LE swelling  No chest pain,  orthopnea, PND, anasarca, dizziness, palpitations, syncope  GI: No heartburn, indigestion, abdominal pain, nausea, vomiting, diarrhea, change in bowel habits, loss of appetite, bloody stools Resp: +productive cough, unsure color of sputum, sob with exertion, wheezing             No shortness of breath at rest.   No coughing up of blood.  No change in color of mucus.   No chest wall deformity Skin: no rash, lesions, no skin changes. GU: no dysuria, change in color of urine, no urgency or frequency.  No flank pain, no hematuria  MS:  No joint pain or swelling.  No decreased range of motion.  No back pain. Psych:  No change in mood or affect. No depression or anxiety.  No memory loss.   Physical Exam  BP  138/80   Pulse 67   Temp 98.1 F (36.7 C) (Oral)   Ht 5' 3"  (1.6 m)   Wt 156 lb (70.8 kg)   SpO2 93%   BMI 27.63 kg/m   Wt Readings from Last 3 Encounters:  02/02/18 156 lb (70.8 kg)  12/08/17 150 lb (68 kg)  07/24/17 130 lb (59 kg)     GEN: A/Ox3; pleasant , NAD, well nourished    HEENT:  /AT,  EACs-clear, TMs-wnl, NOSE-clear, THROAT-clear, no lesions, no postnasal drip or exudate noted.   NECK:  Supple w/ fair ROM; no JVD; normal carotid impulses w/o bruits; no thyromegaly or nodules palpated; no lymphadenopathy.    RESP: +inspiratory and expiratory wheezes, diminished air movement in all lobes no accessory muscle use, no dullness to percussion  CARD: +LE swelling bilaterally, wearing compression stockings  RRR, no m/r/g, no peripheral edema, pulses intact, no cyanosis or clubbing.  GI:   Soft & nt; nml bowel sounds; no organomegaly or masses detected.   Musco: Warm bil, no deformities or joint swelling noted, in wheelchair     Neuro: alert, confused    Skin: Warm, no lesions or rashes    Lab Results:  CBC    Component Value Date/Time   WBC 7.2 02/02/2018 1031   RBC 3.88 02/02/2018 1031   HGB 12.0 02/02/2018 1031   HCT 34.9 (L) 02/02/2018 1031   PLT 272.0 02/02/2018 1031   MCV 89.8 02/02/2018 1031   MCH 30.3 07/24/2017 1822   MCHC 34.4 02/02/2018 1031   RDW 15.3 02/02/2018 1031   LYMPHSABS 0.9 02/02/2018 1031   MONOABS 0.5 02/02/2018 1031   EOSABS 0.7 02/02/2018 1031   BASOSABS 0.0 02/02/2018 1031    BMET    Component Value Date/Time   NA 135 02/02/2018 1031   K 4.0 02/02/2018 1031   CL 95 (L) 02/02/2018 1031   CO2 31 02/02/2018 1031   GLUCOSE 90 02/02/2018 1031   BUN 24 (H) 02/02/2018 1031   CREATININE 1.14 02/02/2018 1031   CALCIUM 9.3 02/02/2018 1031   GFRNONAA 59 (L) 07/24/2017 1822   GFRAA >60 07/24/2017 1822    BNP No results found for: BNP  ProBNP    Component Value Date/Time   PROBNP 37.0 02/02/2018 1031    Imaging: Dg Chest 2 View  Result Date: 02/02/2018 CLINICAL DATA:  Cough, wheezing. EXAM: CHEST - 2 VIEW COMPARISON:  Radiographs of July 24, 2017. FINDINGS: The heart size and mediastinal contours are within normal limits. Atherosclerosis of thoracic aorta is noted. No pneumothorax or pleural effusion is noted. Both lungs are clear. The visualized skeletal structures are unremarkable. IMPRESSION: No active cardiopulmonary disease. Aortic Atherosclerosis (ICD10-I70.0). Electronically Signed   By: Marijo Conception, M.D.   On: 02/02/2018 10:54     Assessment & Plan:   Pleasant 84 year old patient seen in office today. Pt was asthma exacerbation. Pt also with potential fluid overload.   Will do lab work today: CMET, BNP, CBC today. Chest Xray. And 1 week follow up.   Will hold xolair today. Will discuss with Dr. Lake Bells if we should continue xolair, switch to dupixent, or consider low dose prednisone for further therapies etc.    Edema Lab work today Continue  Lasix 20 mg daily at this time Follow-up in 1 week   Moderate persistent asthma Prednisone 37m  >>> Take 2 tablets (213mtotal) daily for the next 5 days >>> Take with food  Lab work today >>> C met,  BNP, CBC  Hold Xolair today  Follow-up in 1 week  Shortness of breath Prednisone 63m  >>> Take 2 tablets (233mtotal) daily for the next 5 days >>> Take with food  Lab work today >>> C met, BNP, CBC  Hold Xolair today  Follow-up in 1 week     BrLauraine RinneNP 02/02/2018

## 2018-02-02 ENCOUNTER — Other Ambulatory Visit (INDEPENDENT_AMBULATORY_CARE_PROVIDER_SITE_OTHER): Payer: Medicare Other

## 2018-02-02 ENCOUNTER — Ambulatory Visit (INDEPENDENT_AMBULATORY_CARE_PROVIDER_SITE_OTHER)
Admission: RE | Admit: 2018-02-02 | Discharge: 2018-02-02 | Disposition: A | Discharge status: Home / Self Care | Payer: Medicare Other | Source: Ambulatory Visit | Attending: Pulmonary Disease | Admitting: Pulmonary Disease

## 2018-02-02 ENCOUNTER — Encounter: Payer: Self-pay | Admitting: Pulmonary Disease

## 2018-02-02 ENCOUNTER — Ambulatory Visit: Payer: Medicare Other

## 2018-02-02 ENCOUNTER — Ambulatory Visit (INDEPENDENT_AMBULATORY_CARE_PROVIDER_SITE_OTHER): Payer: Medicare Other | Admitting: Pulmonary Disease

## 2018-02-02 VITALS — BP 138/80 | HR 67 | Temp 98.1°F | Ht 63.0 in | Wt 156.0 lb

## 2018-02-02 DIAGNOSIS — R609 Edema, unspecified: Secondary | ICD-10-CM

## 2018-02-02 DIAGNOSIS — J4540 Moderate persistent asthma, uncomplicated: Secondary | ICD-10-CM

## 2018-02-02 DIAGNOSIS — J4541 Moderate persistent asthma with (acute) exacerbation: Secondary | ICD-10-CM | POA: Diagnosis not present

## 2018-02-02 DIAGNOSIS — R0602 Shortness of breath: Secondary | ICD-10-CM

## 2018-02-02 DIAGNOSIS — R05 Cough: Secondary | ICD-10-CM | POA: Diagnosis not present

## 2018-02-02 LAB — CBC WITH DIFFERENTIAL/PLATELET
Basophils Absolute: 0 10*3/uL (ref 0.0–0.1)
Basophils Relative: 0.7 % (ref 0.0–3.0)
EOS ABS: 0.7 10*3/uL (ref 0.0–0.7)
Eosinophils Relative: 9.5 % — ABNORMAL HIGH (ref 0.0–5.0)
HCT: 34.9 % — ABNORMAL LOW (ref 36.0–46.0)
Hemoglobin: 12 g/dL (ref 12.0–15.0)
LYMPHS ABS: 0.9 10*3/uL (ref 0.7–4.0)
Lymphocytes Relative: 13.2 % (ref 12.0–46.0)
MCHC: 34.4 g/dL (ref 30.0–36.0)
MCV: 89.8 fl (ref 78.0–100.0)
MONO ABS: 0.5 10*3/uL (ref 0.1–1.0)
Monocytes Relative: 7.4 % (ref 3.0–12.0)
Neutro Abs: 5 10*3/uL (ref 1.4–7.7)
Neutrophils Relative %: 69.2 % (ref 43.0–77.0)
Platelets: 272 10*3/uL (ref 150.0–400.0)
RBC: 3.88 Mil/uL (ref 3.87–5.11)
RDW: 15.3 % (ref 11.5–15.5)
WBC: 7.2 10*3/uL (ref 4.0–10.5)

## 2018-02-02 LAB — COMPREHENSIVE METABOLIC PANEL
ALK PHOS: 100 U/L (ref 39–117)
ALT: 10 U/L (ref 0–35)
AST: 15 U/L (ref 0–37)
Albumin: 3.9 g/dL (ref 3.5–5.2)
BUN: 24 mg/dL — ABNORMAL HIGH (ref 6–23)
CO2: 31 mEq/L (ref 19–32)
Calcium: 9.3 mg/dL (ref 8.4–10.5)
Chloride: 95 mEq/L — ABNORMAL LOW (ref 96–112)
Creatinine, Ser: 1.14 mg/dL (ref 0.40–1.20)
GFR: 47.34 mL/min — ABNORMAL LOW (ref >60.00)
Glucose, Bld: 90 mg/dL (ref 70–99)
POTASSIUM: 4 meq/L (ref 3.5–5.1)
SODIUM: 135 meq/L (ref 135–145)
TOTAL PROTEIN: 6.8 g/dL (ref 6.0–8.3)
Total Bilirubin: 0.6 mg/dL (ref 0.2–1.2)

## 2018-02-02 LAB — BRAIN NATRIURETIC PEPTIDE: Pro B Natriuretic peptide (BNP): 37 pg/mL (ref 0.0–100.0)

## 2018-02-02 MED ORDER — PREDNISONE 10 MG PO TABS: ORAL_TABLET | ORAL | 0 refills | Status: DC

## 2018-02-02 MED ORDER — LEVALBUTEROL HCL 0.63 MG/3ML IN NEBU
0.6300 mg | INHALATION_SOLUTION | RESPIRATORY_TRACT | Status: AC
  Administered 2018-02-02: 0.63 mg via RESPIRATORY_TRACT

## 2018-02-02 NOTE — Assessment & Plan Note (Signed)
Prednisone 72m  >>> Take 2 tablets (257mtotal) daily for the next 5 days >>> Take with food  Lab work today >>> C met, BNP, CBC  Hold Xolair today  Follow-up in 1 week

## 2018-02-02 NOTE — Assessment & Plan Note (Signed)
Prednisone 10mg  >>> Take 2 tablets (20mg total) daily for the next 5 days >>> Take with food  Lab work today >>> C met, BNP, CBC  Hold Xolair today  Follow-up in 1 week 

## 2018-02-02 NOTE — Progress Notes (Signed)
Your chest x-ray and lab results have come back.  Continue Lasix as prescribed 20 mg daily.  We can work to reschedule your Xolair injection that you did not get today.  We will still see you for follow-up next week.  Wyn Quaker FNP

## 2018-02-02 NOTE — Patient Instructions (Addendum)
Prednisone 51m  >>> Take 2 tablets (220mtotal) daily for the next 5 days >>> Take with food  Lab work today >>> C met, BNP, CBC  Hold Xolair today  Follow-up in 1 week   Please contact the office if your symptoms worsen or you have concerns that you are not improving.   Thank you for choosing [facility] for your healthcare, and for allowing [facility] partner with you on your healthcare journey. I am thankful to be able to provide care to you today.   BrWyn QuakerNP-C

## 2018-02-02 NOTE — Progress Notes (Signed)
Chest x-ray results and lab work have come back.  Continue Lasix 20 mg daily.  No other further changes.  Continue follow-up with [place] in 1 week.  Wyn Quaker FNP

## 2018-02-02 NOTE — Assessment & Plan Note (Signed)
Lab work today Continue Lasix 20 mg daily at this time Follow-up in 1 week

## 2018-02-04 ENCOUNTER — Telehealth: Payer: Self-pay | Admitting: Pulmonary Disease

## 2018-02-04 NOTE — Telephone Encounter (Signed)
Attempted to call Rejeana Brock at phone [phone] I did not receive an answer at time of call. I have left a voicemail message for pt to return call. X1

## 2018-02-07 NOTE — Telephone Encounter (Signed)
ATC Misty X4, line rang to fast busy signal. Wcb.

## 2018-02-08 NOTE — Telephone Encounter (Signed)
Attempted to contact Misty multiple times but each time while trying to contact her, receive a fast busy signal. Will try to call back later.

## 2018-02-09 NOTE — Telephone Encounter (Signed)
Attempted to contact Misty at [facility] several times this morning. Triage to call back again today.

## 2018-02-09 NOTE — Progress Notes (Signed)
@Patient  ID: [name], female    DOB: 07/03/1924, 84 y.o.   MRN: [id number]  Chief Complaint  Patient presents with  . Follow-up    States she is feeling much better.     Referring provider: Tisovec, Fransico Him, MD  HPI:  84 year old female Nursing home pt never smoker followed for Asthma. Xolair injections. Pt of Dr. Lake Bells.   PMH : Dementia    Recent [facility] Encounters:   12/08/17 - TP - OV  Plan: Continue on Pulmicort and Perforomist, Continue on Singulair, Continue on Zyrtec, Albuterol as needed, Continue on Xolair  May/19 and June/19-telephone encounter-treated with prednisone  02/02/18 Acute 84 year old patient Dr. Lake Bells.  With 4 days of wheezing.  Productive cough, unfortunately patient is mucus so unsure of color.  Patient and [facility] staff with patient did not recent fevers.  Patient has known dementia is a poor historian regarding her symptoms.  [facility] staff with patient, report patient has seen more fatigued as well as shortness of breath with exertion.  Patient presenting today to also get her Xolair injection. Clinical staff with patient report she has been adherent to medications.  Plan: Hold Xolair, lab work, chest x-ray, follow-up in 1 week, prednisone 20mg  x5days   02/10/18 OV   Pleasant 84 year old patient seen office today.  For 1 week follow-up.  Patient reporting she feels much better.  Patient reports adherence to her nebulized medications.  This is confirmed by the [facility] representative with patient in office today.  Patient is in wheelchair.  Patient does need Xolair injection if she is able to get it today.  Representatives from [facility] are inquiring about starting patient on chronic prednisone.  We will discuss with them today.   Allergies  Allergen Reactions  . Azithromycin Other (See Comments)    Nausea, weakness   . Aspirin Other (See Comments)    Unknown reaction  . Sulfonamide Derivatives Other (See Comments)      Unknown reaction    Immunization History  Administered Date(s) Administered  . Influenza Split 04/04/2012, 05/03/2013, 05/10/2015  . Influenza Whole 04/18/2008, 06/04/2009, 04/03/2010, 03/30/2011  . Influenza,inj,Quad PF,6+ Mos 03/26/2017  . Influenza-Unspecified 06/03/2014, 04/27/2016  . Pneumococcal Conjugate-13 03/05/2014  . Pneumococcal Polysaccharide-23 06/04/2009    Past Medical History:  Diagnosis Date  . Arthritis   . Asthma   . Depression   . Environmental allergies    mold  . Fall   . Hyperlipidemia   . Hypertension   . Memory loss   . Seasonal allergies     Tobacco History: Social History   Tobacco Use  Smoking Status Never Smoker  Smokeless Tobacco Never Used   Counseling given: Yes Continue not smoking.  Outpatient Encounter Medications as of 02/10/2018  Medication Sig  . acetaminophen (TYLENOL) 325 MG tablet Take 650 mg by mouth every 4 (four) hours as needed (pain).  Marland Kitchen albuterol (PROVENTIL HFA;VENTOLIN HFA) 108 (90 Base) MCG/ACT inhaler Inhale 1-2 puffs into the lungs every 6 (six) hours as needed for wheezing or shortness of breath.  Marland Kitchen albuterol (PROVENTIL) (2.5 MG/3ML) 0.083% nebulizer solution Take 3 mLs (2.5 mg total) by nebulization every 4 (four) hours as needed for wheezing or shortness of breath.  . budesonide (PULMICORT) 0.5 MG/2ML nebulizer solution Take 2 mLs (0.5 mg total) by nebulization 2 (two) times daily.  . Calcium Carb-Cholecalciferol (CALCIUM 600-D PO) Take 1 tablet by mouth daily.  . cetirizine (ZYRTEC) 10 MG tablet Take 10 mg by mouth at bedtime.  Marland Kitchen  Cholecalciferol (VITAMIN D) 2000 units tablet Take 2,000 Units by mouth at bedtime.  Marland Kitchen dextromethorphan (DELSYM) 30 MG/5ML liquid Take 60 mg by mouth every 12 (twelve) hours as needed for cough.  . dextromethorphan-guaiFENesin (MUCINEX DM) 30-600 MG 12hr tablet Take 1 tablet by mouth 2 (two) times daily as needed for cough (congestion).   . diphenhydramine-acetaminophen (TYLENOL PM)  25-500 MG TABS tablet Take 1 tablet by mouth at bedtime.   . docusate sodium (COLACE) 100 MG capsule Take 100 mg by mouth at bedtime.   . donepezil (ARICEPT) 5 MG tablet Take 0.5 tablets (2.5 mg total) by mouth at bedtime.  Marland Kitchen FLUoxetine (PROZAC) 20 MG capsule Take 40 mg by mouth daily.   . fluticasone (FLONASE) 50 MCG/ACT nasal spray instill 2 sprays into each nostril once daily (Patient taking differently: Place 2 sprays into both nostrils daily. )  . formoterol (PERFOROMIST) 20 MCG/2ML nebulizer solution Take 2 mLs (20 mcg total) by nebulization 2 (two) times daily.  . furosemide (LASIX) 20 MG tablet Take 20 mg by mouth daily.  Marland Kitchen Heat Wraps (THERMACARE BACK/HIP) MISC Place 1 patch onto the skin as needed. Not to be worn more than 8 hours  . ipratropium-albuterol (DUONEB) 0.5-2.5 (3) MG/3ML SOLN Take 3 mLs by nebulization every 4 (four) hours as needed (shortness of breath/wheezing).  . Liniments (SALONPAS PAIN RELIEF PATCH EX) Place 1 patch onto the skin daily as needed (neck pain (remove patch after 12 hours)).  Marland Kitchen memantine (NAMENDA) 10 MG tablet Take 1 tablet (10 mg total) by mouth 2 (two) times daily.  . montelukast (SINGULAIR) 10 MG tablet Take 1 tablet (10 mg total) by mouth daily. (Patient taking differently: Take 10 mg by mouth at bedtime. )  . olmesartan (BENICAR) 40 MG tablet Take 40 mg by mouth daily.  Marland Kitchen omeprazole (PRILOSEC) 20 MG capsule Take 20 mg by mouth daily before breakfast.  . polyethylene glycol (MIRALAX / GLYCOLAX) packet Take 17 g by mouth daily.  . predniSONE (DELTASONE) 10 MG tablet Take 2 tablets daily for 5 days  . spiritus frumenti (ETHYL ALCOHOL) SOLN Take 1 each by mouth See admin instructions. May have 1-2 glasses of wine every evening(4 oz each) for a total of 8 oz daily  . vitamin B-12 (CYANOCOBALAMIN) 1000 MCG tablet Take 3,000 mcg by mouth daily.  . [DISCONTINUED] rivaroxaban (XARELTO) 10 MG TABS tablet Take 1 tablet (10 mg total) by mouth daily.    Facility-Administered Encounter Medications as of 02/10/2018  Medication  . omalizumab Arvid Right) injection 300 mg  . omalizumab Arvid Right) injection 300 mg  . omalizumab Arvid Right) injection 300 mg  . omalizumab Arvid Right) injection 300 mg     Review of Systems  Review of Systems  Constitutional: Negative for chills, fatigue, fever and unexpected weight change.  HENT: Negative for congestion, ear pain, postnasal drip, rhinorrhea, sinus pressure, sinus pain, sneezing and sore throat.   Respiratory: Positive for wheezing (improved ). Negative for cough, chest tightness and shortness of breath.   Cardiovascular: Negative for chest pain and palpitations.  Gastrointestinal: Negative for blood in stool, diarrhea, nausea and vomiting.  Endocrine: Negative for polydipsia, polyphagia and polyuria.  Genitourinary: Negative for dysuria, frequency and urgency.  Musculoskeletal: Negative for arthralgias.  Skin: Negative for color change.  Allergic/Immunologic: Negative for environmental allergies, food allergies and immunocompromised state.  Neurological: Negative for dizziness, tremors, light-headedness and headaches.  Psychiatric/Behavioral: Negative for dysphoric mood. The patient is not nervous/anxious.       Physical Exam  BP 124/78   Pulse 77   Ht 5\' 3"  (1.6 m)   Wt 157 lb 11.2 oz (71.5 kg)   SpO2 99%   BMI 27.94 kg/m   Wt Readings from Last 5 Encounters:  02/10/18 157 lb 11.2 oz (71.5 kg)  02/02/18 156 lb (70.8 kg)  12/08/17 150 lb (68 kg)  07/24/17 130 lb (59 kg)  07/01/17 142 lb (64.4 kg)     Physical Exam  Constitutional: She is oriented to person, place, and time and well-developed, well-nourished, and in no distress. Vital signs are normal. No distress.  HENT:  Head: Normocephalic and atraumatic.  Right Ear: Hearing, tympanic membrane, external ear and ear canal normal.  Left Ear: Hearing, tympanic membrane, external ear and ear canal normal.  Nose: Nose normal. Right  sinus exhibits no maxillary sinus tenderness and no frontal sinus tenderness. Left sinus exhibits no maxillary sinus tenderness and no frontal sinus tenderness.  Mouth/Throat: Uvula is midline and oropharynx is clear and moist. No oropharyngeal exudate.  Eyes: Pupils are equal, round, and reactive to light.  Neck: Normal range of motion. Neck supple. No JVD present.  Cardiovascular: Normal rate, regular rhythm and normal heart sounds.  Pulmonary/Chest: Effort normal. No accessory muscle usage. No respiratory distress. She has no decreased breath sounds. She has wheezes (Improved since last exam, minor inspiratory wheeze.  Slight expiratory wheeze.  Air movement throughout all lobes.). She has no rhonchi.  Abdominal: Soft. Bowel sounds are normal. There is no tenderness.  Musculoskeletal: Normal range of motion. She exhibits no edema (Bilateral lower extremity edema, this is chronic, compression stockings present on exam.).  Lymphadenopathy:    She has no cervical adenopathy.  Neurological: She is alert and oriented to person, place, and time.  In wheelchair chronically.  Skin: Skin is warm and dry. She is not diaphoretic. No erythema.  Psychiatric: Mood, memory, affect and judgment normal.  Nursing note and vitals reviewed.     Lab Results:  CBC    Component Value Date/Time   WBC 7.2 02/02/2018 1031   RBC 3.88 02/02/2018 1031   HGB 12.0 02/02/2018 1031   HCT 34.9 (L) 02/02/2018 1031   PLT 272.0 02/02/2018 1031   MCV 89.8 02/02/2018 1031   MCH 30.3 07/24/2017 1822   MCHC 34.4 02/02/2018 1031   RDW 15.3 02/02/2018 1031   LYMPHSABS 0.9 02/02/2018 1031   MONOABS 0.5 02/02/2018 1031   EOSABS 0.7 02/02/2018 1031   BASOSABS 0.0 02/02/2018 1031    BMET    Component Value Date/Time   NA 135 02/02/2018 1031   K 4.0 02/02/2018 1031   CL 95 (L) 02/02/2018 1031   CO2 31 02/02/2018 1031   GLUCOSE 90 02/02/2018 1031   BUN 24 (H) 02/02/2018 1031   CREATININE 1.14 02/02/2018 1031    CALCIUM 9.3 02/02/2018 1031   GFRNONAA 59 (L) 07/24/2017 1822   GFRAA >60 07/24/2017 1822    BNP No results found for: BNP  ProBNP    Component Value Date/Time   PROBNP 37.0 02/02/2018 1031    Imaging: Dg Chest 2 View  Result Date: 02/02/2018 CLINICAL DATA:  Cough, wheezing. EXAM: CHEST - 2 VIEW COMPARISON:  Radiographs of July 24, 2017. FINDINGS: The heart size and mediastinal contours are within normal limits. Atherosclerosis of thoracic aorta is noted. No pneumothorax or pleural effusion is noted. Both lungs are clear. The visualized skeletal structures are unremarkable. IMPRESSION: No active cardiopulmonary disease. Aortic Atherosclerosis (ICD10-I70.0). Electronically Signed   By: Jeneen Rinks  Murlean Caller, M.D.   On: 02/02/2018 10:54     Assessment & Plan:   Pleasant 84 year old patient seen office today.  Patient to get Xolair injection today.  Discussed with patient potential of switching to Dupixent.  Will contact patient's daughter to discuss this as well.  Patient to keep follow-up appointment with Dr. Lake Bells.  This appointment is on 03/09/2018.  Patient agrees.  Potentially we could start chronic prednisone use but I am hesitant to do so at this time.  Especially with such a quick rebound with short prednisone dose.  Hopeful that his Xolair injection will be helpful to patient.  Attempted to call patient's daughter.  See telephone encounter.  Will try again later on today or tomorrow.  Moderate persistent asthma Patient to get Xolair today Keep follow-up appointment with Dr. Lake Bells in 03/09/2018 We will contact her daughter and discuss potentially switching him to another injectable medication called Dupixent  Continue respiratory medications as prescribed.  Continue Pulmicort, Perforomist, albuterol nebulizers, rescue inhaler, Singulair.  Follow-up with our office if you have any concerns about your respiratory status    This appointment was 27 minutes along with 50% of the  time direct face-to-face patient care, discussion, assessment, follow-up.   Lauraine Rinne, NP 02/10/2018

## 2018-02-10 ENCOUNTER — Telehealth: Payer: Self-pay | Admitting: Pulmonary Disease

## 2018-02-10 ENCOUNTER — Ambulatory Visit (INDEPENDENT_AMBULATORY_CARE_PROVIDER_SITE_OTHER): Payer: Medicare Other | Admitting: Pulmonary Disease

## 2018-02-10 ENCOUNTER — Ambulatory Visit (INDEPENDENT_AMBULATORY_CARE_PROVIDER_SITE_OTHER): Payer: Medicare Other

## 2018-02-10 ENCOUNTER — Encounter: Payer: Self-pay | Admitting: Pulmonary Disease

## 2018-02-10 DIAGNOSIS — J4540 Moderate persistent asthma, uncomplicated: Secondary | ICD-10-CM

## 2018-02-10 NOTE — Telephone Encounter (Signed)
02/10/18  Contacted patient's daughter to discuss recent exacerbations, patient's appointment today, and how we proceed forward with the correct plan of care.  Informed patient's daughter that patient looked much better today, and was able to complete her Xolair injection.  Unfortunately Dr. Lake Bells and I both agree that she may not be the most controlled on her Xolair injections.  As she has had multiple exacerbations in the past few months.  One option we can consider that I discussed today with Ms. Claudia Desanctis was potentially trialing Ms. Bortle off of Xolair and switching to [place].  Another option is to continue Xolair and watch patient closely.  Ordered continue Xolair as well as low-dose prednisone to help manage patient's symptoms.  I explained to the patient starts very difficult to make these changes with limited amount of office visits to see the patient as well as limited amount of office visits to see patient had stable interval.  We will not make any changes at this time.  We will have patient follow-up with Dr. Lake Bells on 03/09/2018.  We will also make sure that the appointment patient's daughter is present.  If agreeable and patient is stable at that time we could proceed forward with 1 of these options. Pt daughter agreed.   Wyn Quaker FNP

## 2018-02-10 NOTE — Assessment & Plan Note (Signed)
Patient to get Xolair today Keep follow-up appointment with Dr. Lake Bells in 03/09/2018 We will contact her daughter and discuss potentially switching him to another injectable medication called Dupixent  Continue respiratory medications as prescribed.  Continue Pulmicort, Perforomist, albuterol nebulizers, rescue inhaler, Singulair.  Follow-up with our office if you have any concerns about your respiratory status

## 2018-02-10 NOTE — Patient Instructions (Addendum)
Patient to get Xolair today Keep follow-up appointment with Dr. Lake Bells in 03/09/2018 We will contact her daughter and discuss potentially switching him to another injectable medication called Dupixent  Continue respiratory medications as prescribed.  Continue Pulmicort, Perforomist, albuterol nebulizers, rescue inhaler, Singulair.  Follow-up with our office if you have any concerns about your respiratory status  Please contact the office if your symptoms worsen or you have concerns that you are not improving.   Thank you for choosing [facility] for your healthcare, and for allowing [place] to partner with you on your healthcare journey. I am thankful to be able to provide care to you today.   Wyn Quaker FNP-C

## 2018-02-10 NOTE — Telephone Encounter (Signed)
ATC x2. Received a busy tone both times. Will call back later.

## 2018-02-10 NOTE — Telephone Encounter (Signed)
02/10/18 1550  Called Pt daughter regarding

## 2018-02-10 NOTE — Progress Notes (Signed)
Difficult case.  Need to consider biologic agent for recurrent severe asthma exacerbations.

## 2018-02-11 MED ORDER — OMALIZUMAB 150 MG ~~LOC~~ SOLR
300.0000 mg | Freq: Once | SUBCUTANEOUS | Status: AC
  Administered 2018-02-10: 300 mg via SUBCUTANEOUS

## 2018-02-23 ENCOUNTER — Telehealth: Payer: Self-pay | Admitting: Pulmonary Disease

## 2018-02-23 DIAGNOSIS — J4540 Moderate persistent asthma, uncomplicated: Secondary | ICD-10-CM

## 2018-02-23 MED ORDER — PREDNISONE 10 MG PO TABS: ORAL_TABLET | ORAL | 0 refills | Status: DC

## 2018-02-23 NOTE — Telephone Encounter (Signed)
LMTC x 1  

## 2018-02-23 NOTE — Telephone Encounter (Signed)
Spoke with Harle Battiest at [facility] is aware that Rx per BQ and took verbal.

## 2018-02-23 NOTE — Telephone Encounter (Signed)
Called and spoke with [facility] with [facility] at phone [phone] She states pt has dry cough, cannot get anything to come up, and has increase wheezing Misty is requesting if pt needs to be seen or could something be called in   BQ please advise

## 2018-02-23 NOTE — Telephone Encounter (Signed)
Prednisone 20 mg daily x 5 days Keep appointment for 8/7 as previously arranged

## 2018-02-24 ENCOUNTER — Ambulatory Visit (INDEPENDENT_AMBULATORY_CARE_PROVIDER_SITE_OTHER): Payer: Medicare Other

## 2018-02-24 DIAGNOSIS — J4540 Moderate persistent asthma, uncomplicated: Secondary | ICD-10-CM

## 2018-03-01 MED ORDER — OMALIZUMAB 150 MG ~~LOC~~ SOLR
300.0000 mg | SUBCUTANEOUS | Status: DC
  Administered 2018-02-24: 300 mg via SUBCUTANEOUS

## 2018-03-01 NOTE — Progress Notes (Signed)
Documentation of medication administration and charges of Xolair have been completed by Lindsay Lemons, CMA based on the Xolair documentation sheet completed by Tammy Scott.  

## 2018-03-02 DIAGNOSIS — R293 Abnormal posture: Secondary | ICD-10-CM | POA: Diagnosis not present

## 2018-03-02 DIAGNOSIS — G301 Alzheimer's disease with late onset: Secondary | ICD-10-CM | POA: Diagnosis not present

## 2018-03-02 DIAGNOSIS — R102 Pelvic and perineal pain: Secondary | ICD-10-CM | POA: Diagnosis not present

## 2018-03-02 DIAGNOSIS — M6389 Disorders of muscle in diseases classified elsewhere, multiple sites: Secondary | ICD-10-CM | POA: Diagnosis not present

## 2018-03-02 DIAGNOSIS — R531 Weakness: Secondary | ICD-10-CM | POA: Diagnosis not present

## 2018-03-02 DIAGNOSIS — M818 Other osteoporosis without current pathological fracture: Secondary | ICD-10-CM | POA: Diagnosis not present

## 2018-03-04 DIAGNOSIS — M6389 Disorders of muscle in diseases classified elsewhere, multiple sites: Secondary | ICD-10-CM | POA: Diagnosis not present

## 2018-03-04 DIAGNOSIS — M1990 Unspecified osteoarthritis, unspecified site: Secondary | ICD-10-CM | POA: Diagnosis not present

## 2018-03-04 DIAGNOSIS — I129 Hypertensive chronic kidney disease with stage 1 through stage 4 chronic kidney disease, or unspecified chronic kidney disease: Secondary | ICD-10-CM | POA: Diagnosis not present

## 2018-03-04 DIAGNOSIS — R32 Unspecified urinary incontinence: Secondary | ICD-10-CM | POA: Diagnosis not present

## 2018-03-04 DIAGNOSIS — N183 Chronic kidney disease, stage 3 (moderate): Secondary | ICD-10-CM | POA: Diagnosis not present

## 2018-03-04 DIAGNOSIS — I10 Essential (primary) hypertension: Secondary | ICD-10-CM | POA: Diagnosis not present

## 2018-03-04 DIAGNOSIS — M818 Other osteoporosis without current pathological fracture: Secondary | ICD-10-CM | POA: Diagnosis not present

## 2018-03-04 DIAGNOSIS — M810 Age-related osteoporosis without current pathological fracture: Secondary | ICD-10-CM | POA: Diagnosis not present

## 2018-03-04 DIAGNOSIS — E871 Hypo-osmolality and hyponatremia: Secondary | ICD-10-CM | POA: Diagnosis not present

## 2018-03-04 DIAGNOSIS — R159 Full incontinence of feces: Secondary | ICD-10-CM | POA: Diagnosis not present

## 2018-03-04 DIAGNOSIS — G301 Alzheimer's disease with late onset: Secondary | ICD-10-CM | POA: Diagnosis not present

## 2018-03-04 DIAGNOSIS — E559 Vitamin D deficiency, unspecified: Secondary | ICD-10-CM | POA: Diagnosis not present

## 2018-03-04 DIAGNOSIS — J4540 Moderate persistent asthma, uncomplicated: Secondary | ICD-10-CM | POA: Diagnosis not present

## 2018-03-04 DIAGNOSIS — R531 Weakness: Secondary | ICD-10-CM | POA: Diagnosis not present

## 2018-03-04 DIAGNOSIS — R413 Other amnesia: Secondary | ICD-10-CM | POA: Diagnosis not present

## 2018-03-04 DIAGNOSIS — R102 Pelvic and perineal pain: Secondary | ICD-10-CM | POA: Diagnosis not present

## 2018-03-04 DIAGNOSIS — R293 Abnormal posture: Secondary | ICD-10-CM | POA: Diagnosis not present

## 2018-03-06 DIAGNOSIS — R102 Pelvic and perineal pain: Secondary | ICD-10-CM | POA: Diagnosis not present

## 2018-03-06 DIAGNOSIS — G301 Alzheimer's disease with late onset: Secondary | ICD-10-CM | POA: Diagnosis not present

## 2018-03-06 DIAGNOSIS — M818 Other osteoporosis without current pathological fracture: Secondary | ICD-10-CM | POA: Diagnosis not present

## 2018-03-06 DIAGNOSIS — R293 Abnormal posture: Secondary | ICD-10-CM | POA: Diagnosis not present

## 2018-03-06 DIAGNOSIS — M6389 Disorders of muscle in diseases classified elsewhere, multiple sites: Secondary | ICD-10-CM | POA: Diagnosis not present

## 2018-03-06 DIAGNOSIS — R531 Weakness: Secondary | ICD-10-CM | POA: Diagnosis not present

## 2018-03-07 ENCOUNTER — Telehealth: Payer: Self-pay | Admitting: Pulmonary Disease

## 2018-03-07 DIAGNOSIS — Z961 Presence of intraocular lens: Secondary | ICD-10-CM | POA: Diagnosis not present

## 2018-03-07 DIAGNOSIS — H35033 Hypertensive retinopathy, bilateral: Secondary | ICD-10-CM | POA: Diagnosis not present

## 2018-03-07 DIAGNOSIS — H04123 Dry eye syndrome of bilateral lacrimal glands: Secondary | ICD-10-CM | POA: Diagnosis not present

## 2018-03-07 DIAGNOSIS — H029 Unspecified disorder of eyelid: Secondary | ICD-10-CM | POA: Diagnosis not present

## 2018-03-07 DIAGNOSIS — H35363 Drusen (degenerative) of macula, bilateral: Secondary | ICD-10-CM | POA: Diagnosis not present

## 2018-03-08 DIAGNOSIS — G301 Alzheimer's disease with late onset: Secondary | ICD-10-CM | POA: Diagnosis not present

## 2018-03-08 DIAGNOSIS — M6389 Disorders of muscle in diseases classified elsewhere, multiple sites: Secondary | ICD-10-CM | POA: Diagnosis not present

## 2018-03-08 DIAGNOSIS — R531 Weakness: Secondary | ICD-10-CM | POA: Diagnosis not present

## 2018-03-08 DIAGNOSIS — M818 Other osteoporosis without current pathological fracture: Secondary | ICD-10-CM | POA: Diagnosis not present

## 2018-03-08 DIAGNOSIS — R293 Abnormal posture: Secondary | ICD-10-CM | POA: Diagnosis not present

## 2018-03-08 DIAGNOSIS — R102 Pelvic and perineal pain: Secondary | ICD-10-CM | POA: Diagnosis not present

## 2018-03-08 NOTE — Telephone Encounter (Signed)
Prefilled Syringe: #150mg  4  #75mg  0 Ordered Date: 03/07/18 Shipping Date: 03/07/18

## 2018-03-09 ENCOUNTER — Ambulatory Visit (INDEPENDENT_AMBULATORY_CARE_PROVIDER_SITE_OTHER): Payer: Medicare Other | Admitting: Pulmonary Disease

## 2018-03-09 ENCOUNTER — Encounter: Payer: Self-pay | Admitting: Pulmonary Disease

## 2018-03-09 VITALS — BP 118/66 | HR 76

## 2018-03-09 DIAGNOSIS — J383 Other diseases of vocal cords: Secondary | ICD-10-CM | POA: Diagnosis not present

## 2018-03-09 DIAGNOSIS — J4551 Severe persistent asthma with (acute) exacerbation: Secondary | ICD-10-CM | POA: Diagnosis not present

## 2018-03-09 DIAGNOSIS — K219 Gastro-esophageal reflux disease without esophagitis: Secondary | ICD-10-CM

## 2018-03-09 NOTE — Patient Instructions (Signed)
Severe recurrent asthma with recurrent exacerbations and elevated serum eosinophils: We are going to prescribe a medicine called Dupixent Once this medicine is approved we will overlap it with the Xolair for 2 months Continue Perforomist Continue budesonide Keep using albuterol as needed for chest tightness wheezing or shortness of breath  Gastroesophageal reflux disease: I think this is contributing to your cough Increase omeprazole to twice a day  Vocal cord dysfunction: I will ask the speech therapist at [facility] to work with you on this.  We will see you back in 4 to 6 weeks or sooner if needed

## 2018-03-09 NOTE — Progress Notes (Signed)
Subjective:   PATIENT ID: [name] GENDER: female DOB: [birth year], MRN: [id number]  Synopsis:Former patient of Dr. Ashok Cordia with asthma who has dementia.  She lives in [place].    HPI  Chief Complaint  Patient presents with  . Follow-up    pt c/o increased wheezing   And has been struggling with recurrent exacerbations of asthma for the last several months.  She says that ever since she was relocated to the skilled nursing facility she is noticed increasing frequency of this.  She has had increased shortness of breath and tightness and more audible wheezing.  Sometimes cough with clear mucus production.  She is compliant with her Perforomist and the budesonide.  She says that when she takes prednisone it is very helpful and the wheezing goes away.  She has been compliant with her Xolair injections.  However, despite taking Xolair she continues to struggle with recurrent exacerbations of her asthma.  Past Medical History:  Diagnosis Date  . Arthritis   . Asthma   . Depression   . Environmental allergies    mold  . Fall   . Hyperlipidemia   . Hypertension   . Memory loss   . Seasonal allergies      Review of Systems  Constitutional: Negative for chills, diaphoresis and malaise/fatigue.  HENT: Negative for sinus pain and sore throat.   Respiratory: Positive for cough and wheezing. Negative for sputum production and stridor.   Cardiovascular: Negative for chest pain, claudication and leg swelling.      Objective:  Physical Exam   Vitals:   03/09/18 1430  BP: 118/66  Pulse: 76  SpO2: 94%   RA  Gen: chronically ill appearing HENT: OP clear, TM's clear, neck supple PULM: Wheezing both at level of vocal cords and in lung fields B, normal percussion CV: RRR, no mgr, trace edema GI: BS+, soft, nontender Derm: no cyanosis or rash Psyche: normal mood and affect     CBC    Component Value Date/Time   WBC 7.2 02/02/2018 1031   RBC 3.88 02/02/2018 1031     HGB 12.0 02/02/2018 1031   HCT 34.9 (L) 02/02/2018 1031   PLT 272.0 02/02/2018 1031   MCV 89.8 02/02/2018 1031   MCH 30.3 07/24/2017 1822   MCHC 34.4 02/02/2018 1031   RDW 15.3 02/02/2018 1031   LYMPHSABS 0.9 02/02/2018 1031   MONOABS 0.5 02/02/2018 1031   EOSABS 0.7 02/02/2018 1031   BASOSABS 0.0 02/02/2018 1031     PFT 03/29/06: FVC 2.20 L (93%) FEV1 1.45 L (91%) FEV1/FVC 0.66 FEF 25-75 0.74 L (40%) negative bronchodilator response TLC 3.80 L (88%) RV 84% ERV 42% DLCO uncorrected 83%  IMAGING CXR PA/LAT 04/26/17:  No opacity or mass appreciated. No pleural effusion. Heart normal in size & mediastinum normal in contour.  LABS 10/10/8 IgE:  490.7       Assessment & Plan:   Severe persistent asthma with acute exacerbation  Vocal cord dysfunction  Gastroesophageal reflux disease, esophagitis presence not specified  Discussion: And has struggled with recurrent exacerbations of asthma.  Based on her physical exam I believe she also has vocal cord dysfunction, however she has legitimate airflow obstruction seen on lung function testing from 12 years ago and her physical exam is supportive of wheezing in her lung fields in addition to the vocal cords.  She has elevated serum eosinophils as well.  I believe that if we could move her to a different location this  may be helpful but it sounds as if this is not physically possible in her current skilled nursing facility.  So after lengthy conversation we think the best approach is to change to a biologic antagonist of IL-4/13.  We will plan on overlapping this with her Xolair injections for 2 months.  We discussed chronic prednisone but none of [place] feel that that is a good option because of the many side effects that would come from that and she states that whenever she is on prednisone she does not sleep.  Plan: Severe recurrent asthma with recurrent exacerbations and elevated serum eosinophils: We are going to prescribe a medicine  called Dupixent Once this medicine is approved we will overlap it with the Xolair for 2 months Continue Perforomist Continue budesonide Keep using albuterol as needed for chest tightness wheezing or shortness of breath  Gastroesophageal reflux disease: I think this is contributing to your cough Increase omeprazole to twice a day  Vocal cord dysfunction: I will ask the speech therapist at [facility] to work with you on this.  We will see you back in 4 to 6 weeks or sooner if needed   Current Outpatient Medications:  .  acetaminophen (TYLENOL) 325 MG tablet, Take 650 mg by mouth every 4 (four) hours as needed (pain)., Disp: , Rfl:  .  albuterol (PROVENTIL HFA;VENTOLIN HFA) 108 (90 Base) MCG/ACT inhaler, Inhale 1-2 puffs into the lungs every 6 (six) hours as needed for wheezing or shortness of breath., Disp: 1 Inhaler, Rfl: 6 .  albuterol (PROVENTIL) (2.5 MG/3ML) 0.083% nebulizer solution, Take 3 mLs (2.5 mg total) by nebulization every 4 (four) hours as needed for wheezing or shortness of breath., Disp: 120 mL, Rfl: 3 .  budesonide (PULMICORT) 0.5 MG/2ML nebulizer solution, Take 2 mLs (0.5 mg total) by nebulization 2 (two) times daily., Disp: 60 mL, Rfl: 6 .  Calcium Carb-Cholecalciferol (CALCIUM 600-D PO), Take 1 tablet by mouth daily., Disp: , Rfl:  .  cetirizine (ZYRTEC) 10 MG tablet, Take 10 mg by mouth at bedtime., Disp: , Rfl:  .  Cholecalciferol (VITAMIN D) 2000 units tablet, Take 2,000 Units by mouth at bedtime., Disp: , Rfl:  .  dextromethorphan (DELSYM) 30 MG/5ML liquid, Take 60 mg by mouth every 12 (twelve) hours as needed for cough., Disp: , Rfl:  .  dextromethorphan-guaiFENesin (MUCINEX DM) 30-600 MG 12hr tablet, Take 1 tablet by mouth 2 (two) times daily as needed for cough (congestion). , Disp: , Rfl:  .  diphenhydramine-acetaminophen (TYLENOL PM) 25-500 MG TABS tablet, Take 1 tablet by mouth at bedtime. , Disp: , Rfl:  .  docusate sodium (COLACE) 100 MG capsule, Take 100 mg  by mouth at bedtime. , Disp: , Rfl:  .  donepezil (ARICEPT) 5 MG tablet, Take 0.5 tablets (2.5 mg total) by mouth at bedtime., Disp: 30 tablet, Rfl: 8 .  FLUoxetine (PROZAC) 20 MG capsule, Take 40 mg by mouth daily. , Disp: , Rfl:  .  fluticasone (FLONASE) 50 MCG/ACT nasal spray, instill 2 sprays into each nostril once daily (Patient taking differently: Place 2 sprays into both nostrils daily. ), Disp: 16 g, Rfl: 6 .  formoterol (PERFOROMIST) 20 MCG/2ML nebulizer solution, Take 2 mLs (20 mcg total) by nebulization 2 (two) times daily., Disp: 60 mL, Rfl: 6 .  furosemide (LASIX) 20 MG tablet, Take 40 mg by mouth daily. , Disp: , Rfl:  .  Heat Wraps (THERMACARE BACK/HIP) MISC, Place 1 patch onto the skin as needed. Not to be  worn more than 8 hours, Disp: , Rfl:  .  ipratropium-albuterol (DUONEB) 0.5-2.5 (3) MG/3ML SOLN, Take 3 mLs by nebulization every 4 (four) hours as needed (shortness of breath/wheezing)., Disp: , Rfl:  .  Liniments (SALONPAS PAIN RELIEF PATCH EX), Place 1 patch onto the skin daily as needed (neck pain (remove patch after 12 hours))., Disp: , Rfl:  .  memantine (NAMENDA) 10 MG tablet, Take 1 tablet (10 mg total) by mouth 2 (two) times daily., Disp: 60 tablet, Rfl: 11 .  montelukast (SINGULAIR) 10 MG tablet, Take 1 tablet (10 mg total) by mouth daily. (Patient taking differently: Take 10 mg by mouth at bedtime. ), Disp: 30 tablet, Rfl: 6 .  olmesartan (BENICAR) 40 MG tablet, Take 40 mg by mouth daily., Disp: , Rfl:  .  omeprazole (PRILOSEC) 20 MG capsule, Take 20 mg by mouth daily before breakfast., Disp: , Rfl:  .  polyethylene glycol (MIRALAX / GLYCOLAX) packet, Take 17 g by mouth daily., Disp: , Rfl:  .  predniSONE (DELTASONE) 10 MG tablet, Take 2 tablets daily for 5 days, Disp: 10 tablet, Rfl: 0 .  spiritus frumenti (ETHYL ALCOHOL) SOLN, Take 1 each by mouth See admin instructions. May have 1-2 glasses of wine every evening(4 oz each) for a total of 8 oz daily, Disp: , Rfl:  .   vitamin B-12 (CYANOCOBALAMIN) 1000 MCG tablet, Take 3,000 mcg by mouth daily., Disp: , Rfl:   Current Facility-Administered Medications:  .  omalizumab Arvid Right) injection 300 mg, 300 mg, Subcutaneous, Q14 Days, McQuaid, Douglas B, MD, 300 mg at 11/10/17 0913 .  omalizumab Arvid Right) injection 300 mg, 300 mg, Subcutaneous, Q14 Days, McQuaid, Douglas B, MD, 300 mg at 11/24/17 1002 .  omalizumab Arvid Right) injection 300 mg, 300 mg, Subcutaneous, Q14 Days, McQuaid, Douglas B, MD, 300 mg at 12/22/17 1416 .  omalizumab Arvid Right) injection 300 mg, 300 mg, Subcutaneous, Q14 Days, McQuaid, Douglas B, MD, 300 mg at 01/05/18 0842 .  omalizumab Arvid Right) injection 300 mg, 300 mg, Subcutaneous, Q14 Days, McQuaid, Douglas B, MD, 300 mg at 02/24/18 1423

## 2018-03-09 NOTE — Telephone Encounter (Signed)
Prefilled Syringes: # 150mg  4  #75mg  0 Arrival Date: 03/08/18 Lot #: 150mg  [id number]      75mg  0 Exp Date: 150mg  08/2018   75mg  0 Katie aware.

## 2018-03-10 ENCOUNTER — Ambulatory Visit (INDEPENDENT_AMBULATORY_CARE_PROVIDER_SITE_OTHER): Payer: Medicare Other

## 2018-03-10 DIAGNOSIS — J4540 Moderate persistent asthma, uncomplicated: Secondary | ICD-10-CM

## 2018-03-11 DIAGNOSIS — J4540 Moderate persistent asthma, uncomplicated: Secondary | ICD-10-CM

## 2018-03-11 MED ORDER — OMALIZUMAB 150 MG ~~LOC~~ SOLR
300.0000 mg | SUBCUTANEOUS | Status: DC
  Administered 2018-03-11: 300 mg via SUBCUTANEOUS

## 2018-03-11 NOTE — Progress Notes (Signed)
Documentation of medication administration and charges of Xolair have been completed by Lindsay Lemons, CMA based on the Xolair documentation sheet completed by Tammy Scott.  

## 2018-03-14 ENCOUNTER — Telehealth: Payer: Self-pay | Admitting: Pulmonary Disease

## 2018-03-14 MED ORDER — PREDNISONE 20 MG PO TABS: 20.0000 mg | ORAL_TABLET | Freq: Every day | ORAL | 0 refills | Status: AC

## 2018-03-14 NOTE — Telephone Encounter (Signed)
Called and spoke to pt's daughter and gave update on medication being ordered. She thanked [place] for giving her an update since patient is forgetful.  Called and spoke to Merrill at [facility] and gave order for Prednisone per RB. Advised her that RB would like a call back near end of medication to let us know how she is doing.   Nothing further needed at this time.

## 2018-03-14 NOTE — Telephone Encounter (Signed)
Spoke with Transport planner at [facility], states that pt c/o increased sob, wheezing, nonprod cough X1 week.  Normal sats, vitals.  No mucus production, fever.  Requesting recs.  Call in any meds to [facility]- she will do the ordering through [employer].   Sending to DOD as BQ is unavailable today.  RB please advise on recs.  Thanks!

## 2018-03-14 NOTE — Telephone Encounter (Signed)
Review of the chart shows that she does flare frequently, is being considered for dupixient.  Would like to avoid higher doses of steroids in elderly pt . Will give pred 20mg  qd x 5 days. Have her call us back to report whether she has improved.

## 2018-03-15 DIAGNOSIS — G301 Alzheimer's disease with late onset: Secondary | ICD-10-CM | POA: Diagnosis not present

## 2018-03-15 DIAGNOSIS — M6389 Disorders of muscle in diseases classified elsewhere, multiple sites: Secondary | ICD-10-CM | POA: Diagnosis not present

## 2018-03-15 DIAGNOSIS — R293 Abnormal posture: Secondary | ICD-10-CM | POA: Diagnosis not present

## 2018-03-15 DIAGNOSIS — R531 Weakness: Secondary | ICD-10-CM | POA: Diagnosis not present

## 2018-03-15 DIAGNOSIS — M818 Other osteoporosis without current pathological fracture: Secondary | ICD-10-CM | POA: Diagnosis not present

## 2018-03-15 DIAGNOSIS — R102 Pelvic and perineal pain: Secondary | ICD-10-CM | POA: Diagnosis not present

## 2018-03-17 DIAGNOSIS — G301 Alzheimer's disease with late onset: Secondary | ICD-10-CM | POA: Diagnosis not present

## 2018-03-17 DIAGNOSIS — R293 Abnormal posture: Secondary | ICD-10-CM | POA: Diagnosis not present

## 2018-03-17 DIAGNOSIS — M6389 Disorders of muscle in diseases classified elsewhere, multiple sites: Secondary | ICD-10-CM | POA: Diagnosis not present

## 2018-03-17 DIAGNOSIS — R102 Pelvic and perineal pain: Secondary | ICD-10-CM | POA: Diagnosis not present

## 2018-03-17 DIAGNOSIS — M818 Other osteoporosis without current pathological fracture: Secondary | ICD-10-CM | POA: Diagnosis not present

## 2018-03-17 DIAGNOSIS — R531 Weakness: Secondary | ICD-10-CM | POA: Diagnosis not present

## 2018-03-18 ENCOUNTER — Telehealth: Payer: Self-pay | Admitting: Pulmonary Disease

## 2018-03-18 NOTE — Telephone Encounter (Signed)
Called and spoke with Misty at [facility].  Misty stated that they wanted to give Dr Lake Bells an update on [name]. She stated that the Patient is doing much better since starting the Prednisone. She stated that there is minimal wheezing in her lung bases.    Will route to Dr Lake Bells for [facility]

## 2018-03-22 DIAGNOSIS — R102 Pelvic and perineal pain: Secondary | ICD-10-CM | POA: Diagnosis not present

## 2018-03-22 DIAGNOSIS — M6389 Disorders of muscle in diseases classified elsewhere, multiple sites: Secondary | ICD-10-CM | POA: Diagnosis not present

## 2018-03-22 DIAGNOSIS — R531 Weakness: Secondary | ICD-10-CM | POA: Diagnosis not present

## 2018-03-22 DIAGNOSIS — M818 Other osteoporosis without current pathological fracture: Secondary | ICD-10-CM | POA: Diagnosis not present

## 2018-03-22 DIAGNOSIS — G301 Alzheimer's disease with late onset: Secondary | ICD-10-CM | POA: Diagnosis not present

## 2018-03-22 DIAGNOSIS — R293 Abnormal posture: Secondary | ICD-10-CM | POA: Diagnosis not present

## 2018-03-22 NOTE — Telephone Encounter (Signed)
OK 

## 2018-03-23 ENCOUNTER — Telehealth: Payer: Self-pay | Admitting: Pulmonary Disease

## 2018-03-23 NOTE — Telephone Encounter (Signed)
Calling Katrina now, I'm on hold. Had to call her back, she took a late lunch.  She just needed me to confirm dupixent dose. It is 200mg .  I'm going to leave this encounter open to record p/a decision. This will take a few days.

## 2018-03-24 ENCOUNTER — Ambulatory Visit (INDEPENDENT_AMBULATORY_CARE_PROVIDER_SITE_OTHER): Payer: Medicare Other

## 2018-03-24 DIAGNOSIS — R531 Weakness: Secondary | ICD-10-CM | POA: Diagnosis not present

## 2018-03-24 DIAGNOSIS — R293 Abnormal posture: Secondary | ICD-10-CM | POA: Diagnosis not present

## 2018-03-24 DIAGNOSIS — M818 Other osteoporosis without current pathological fracture: Secondary | ICD-10-CM | POA: Diagnosis not present

## 2018-03-24 DIAGNOSIS — M6389 Disorders of muscle in diseases classified elsewhere, multiple sites: Secondary | ICD-10-CM | POA: Diagnosis not present

## 2018-03-24 DIAGNOSIS — J4540 Moderate persistent asthma, uncomplicated: Secondary | ICD-10-CM

## 2018-03-24 DIAGNOSIS — R102 Pelvic and perineal pain: Secondary | ICD-10-CM | POA: Diagnosis not present

## 2018-03-24 DIAGNOSIS — G301 Alzheimer's disease with late onset: Secondary | ICD-10-CM | POA: Diagnosis not present

## 2018-03-25 DIAGNOSIS — J4540 Moderate persistent asthma, uncomplicated: Secondary | ICD-10-CM | POA: Diagnosis not present

## 2018-03-25 MED ORDER — OMALIZUMAB 150 MG ~~LOC~~ SOLR
300.0000 mg | SUBCUTANEOUS | Status: DC
  Administered 2018-03-25: 300 mg via SUBCUTANEOUS

## 2018-03-25 NOTE — Telephone Encounter (Signed)
Dr. Lake Bells, [name] a pharmacist at [facility]. We sent pt's Dupixent enrollment form to [employer] because they do the summary of benefits, P/A if needed, denial if needed. [name] would like for you to call him and discuss [name] dob [birth year] Dupixent with him. CB [phone] It's about overlapping her xolair with the [place] for 2 months. As a pharmacist he has never heard of overlapping therapies. He's concerned with doing this at 23. I explained to him we did this with another pt, but he is younger and healthier than [name]. (He asked)

## 2018-03-25 NOTE — Progress Notes (Signed)
Documentation of medication administration and charges of Xolair have been completed by Shanna Un, CMA based on the hand written Xolair documentation sheet completed by Tammy Scott, who administered the medication.  

## 2018-03-28 DIAGNOSIS — J45909 Unspecified asthma, uncomplicated: Secondary | ICD-10-CM | POA: Diagnosis not present

## 2018-03-28 DIAGNOSIS — J383 Other diseases of vocal cords: Secondary | ICD-10-CM | POA: Insufficient documentation

## 2018-03-28 DIAGNOSIS — M1711 Unilateral primary osteoarthritis, right knee: Secondary | ICD-10-CM | POA: Insufficient documentation

## 2018-03-28 NOTE — Telephone Encounter (Signed)
I'll try to call him this week

## 2018-03-28 NOTE — Telephone Encounter (Signed)
Thank you :)

## 2018-03-29 ENCOUNTER — Telehealth: Payer: Self-pay | Admitting: Pulmonary Disease

## 2018-03-29 DIAGNOSIS — R531 Weakness: Secondary | ICD-10-CM | POA: Diagnosis not present

## 2018-03-29 DIAGNOSIS — M6389 Disorders of muscle in diseases classified elsewhere, multiple sites: Secondary | ICD-10-CM | POA: Diagnosis not present

## 2018-03-29 DIAGNOSIS — R293 Abnormal posture: Secondary | ICD-10-CM | POA: Diagnosis not present

## 2018-03-29 DIAGNOSIS — R102 Pelvic and perineal pain: Secondary | ICD-10-CM | POA: Diagnosis not present

## 2018-03-29 DIAGNOSIS — M1711 Unilateral primary osteoarthritis, right knee: Secondary | ICD-10-CM | POA: Diagnosis not present

## 2018-03-29 DIAGNOSIS — G301 Alzheimer's disease with late onset: Secondary | ICD-10-CM | POA: Diagnosis not present

## 2018-03-29 DIAGNOSIS — M818 Other osteoporosis without current pathological fracture: Secondary | ICD-10-CM | POA: Diagnosis not present

## 2018-03-29 NOTE — Telephone Encounter (Signed)
Prefilled Syringe: #150mg  4  #75mg  0 Ordered Date: 03/29/18 Shipping Date: 03/29/18

## 2018-03-30 NOTE — Telephone Encounter (Signed)
[name] called back to f/u about [name]. I relayed your message that you would try to call him this wk.He said "Ok, just didn't won't the pt to fall through the cracks." His words not mine. (TBS) Routing this to BQ as a friendly reminder.

## 2018-03-30 NOTE — Telephone Encounter (Signed)
Prefilled Syringes: # 150mg  4  #75mg  0 Arrival Date: 03/30/18 Lot #: 150mg  [id number]      75mg  0 Exp Date: 150mg  09/2018   75mg  0

## 2018-03-31 DIAGNOSIS — R102 Pelvic and perineal pain: Secondary | ICD-10-CM | POA: Diagnosis not present

## 2018-03-31 DIAGNOSIS — M6389 Disorders of muscle in diseases classified elsewhere, multiple sites: Secondary | ICD-10-CM | POA: Diagnosis not present

## 2018-03-31 DIAGNOSIS — G301 Alzheimer's disease with late onset: Secondary | ICD-10-CM | POA: Diagnosis not present

## 2018-03-31 DIAGNOSIS — M818 Other osteoporosis without current pathological fracture: Secondary | ICD-10-CM | POA: Diagnosis not present

## 2018-03-31 DIAGNOSIS — R531 Weakness: Secondary | ICD-10-CM | POA: Diagnosis not present

## 2018-03-31 DIAGNOSIS — R293 Abnormal posture: Secondary | ICD-10-CM | POA: Diagnosis not present

## 2018-03-31 NOTE — Telephone Encounter (Signed)
Will route information to T. Scott as Juluis Rainier. Nothing further needed at this time.

## 2018-03-31 NOTE — Telephone Encounter (Signed)
He is going to change the dose of dupixent and we decided that the best approach is to reassess how she is doing on dupixent after a month.  If doing well then we will stop the xolair then

## 2018-03-31 NOTE — Telephone Encounter (Signed)
Noted. Thanks, BQ and [place] for making me aware. I'll note this in pt's Duxipent and xolair folders as well.

## 2018-03-31 NOTE — Telephone Encounter (Signed)
I tried calling that number and it didn't go through. Please get him on the phone this morning and come find me.  I'll be seeing patients but I can step out and talk to him.

## 2018-03-31 NOTE — Telephone Encounter (Signed)
[name], pharmacists at [facility] with MD Lake Bells.

## 2018-04-01 IMAGING — CT CT HEAD W/O CM
4 of 8 series · 14 of 47 positions shown, 15 images · non-contrast
Comparison: None.

CLINICAL DATA: Fall today with back and head pain. Injury to
frontal skull.

EXAM:
CT HEAD WITHOUT CONTRAST
CT CERVICAL SPINE WITHOUT CONTRAST
TECHNIQUE: Multidetector CT imaging of the head and cervical spine was
performed following the standard protocol without intravenous
contrast. Multiplanar CT image reconstructions of the cervical spine
were also generated.

[Series 3: head without · axial · non-contrast · 0.43mm/px · z∈[-152,-2]mm · 3 of 31 slices shown, 4 images]
[im 1/31  brain]
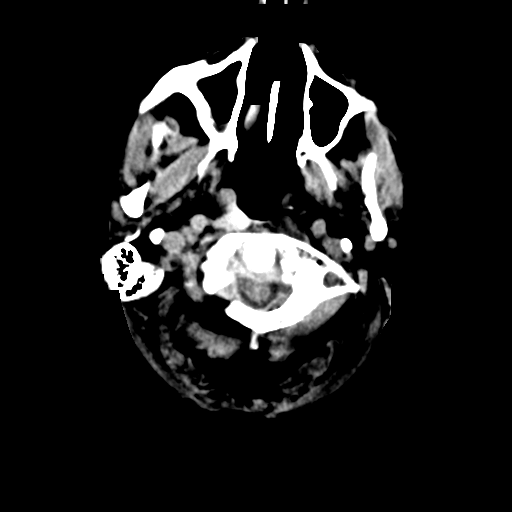
[im 1/31  bone]
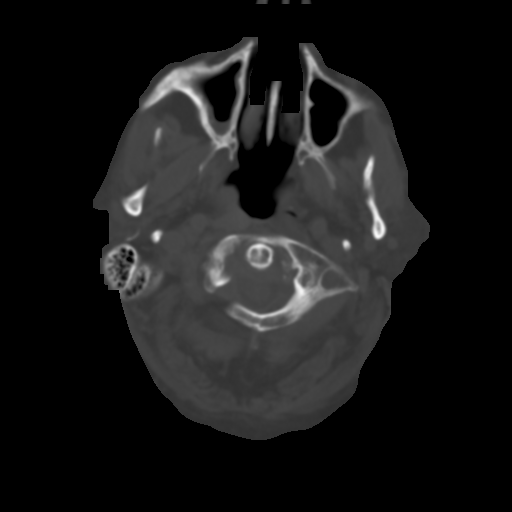
[im 16/31  brain]
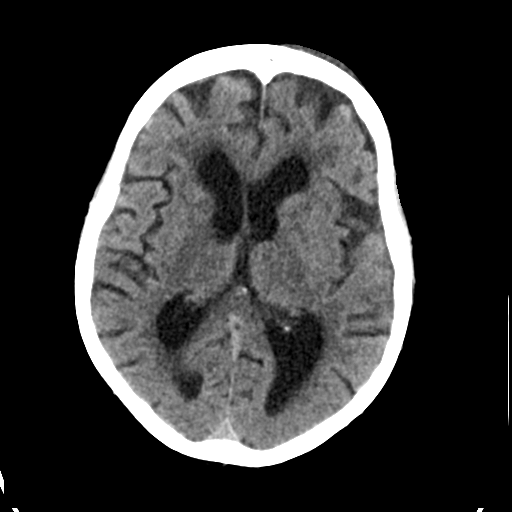
[im 31/31  brain]
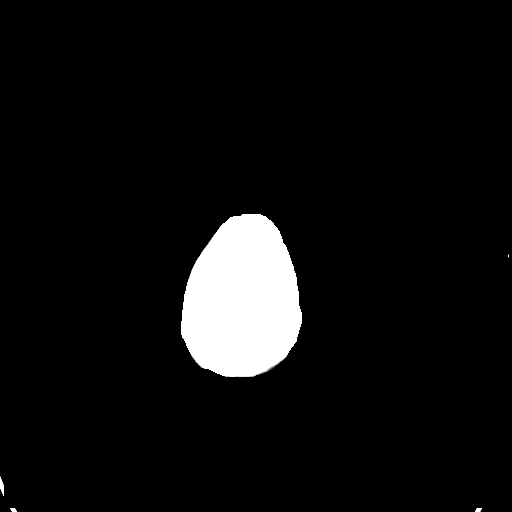

[Series 4: head bone · axial · 0.43mm/px · z∈[-130,-18]mm · 6 of 80 slices shown]
[im 12/80  bone]
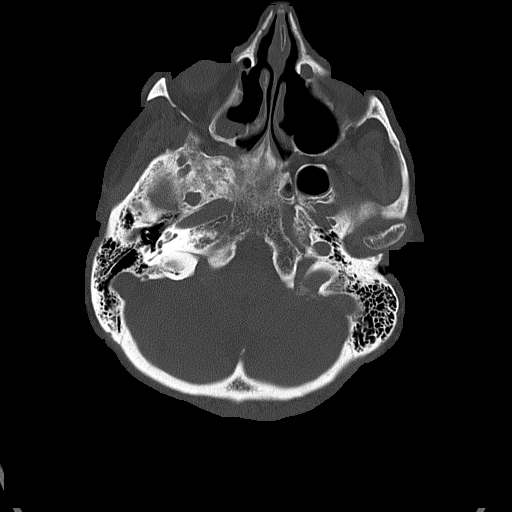
[im 23/80  bone]
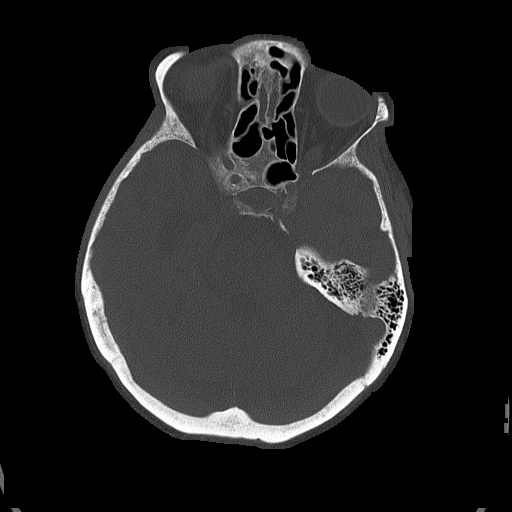
[im 34/80  bone]
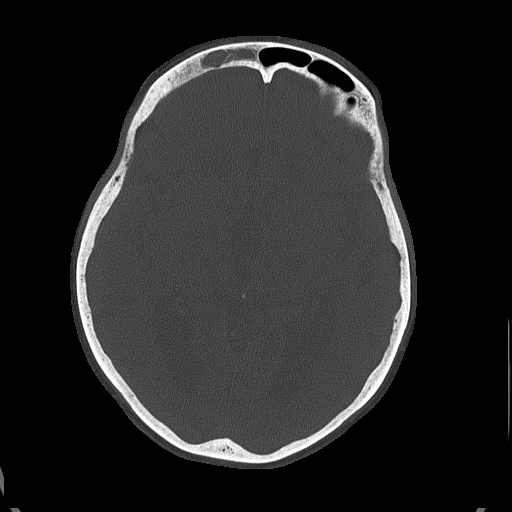
[im 46/80  bone]
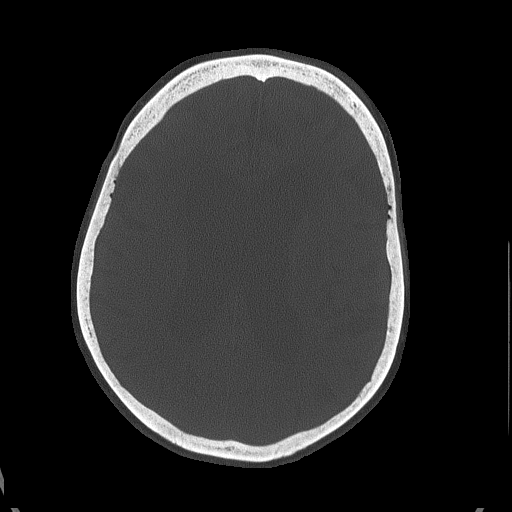
[im 57/80  bone]
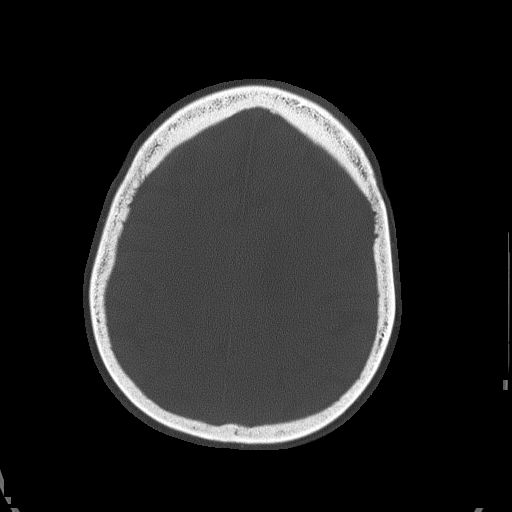
[im 68/80  bone]
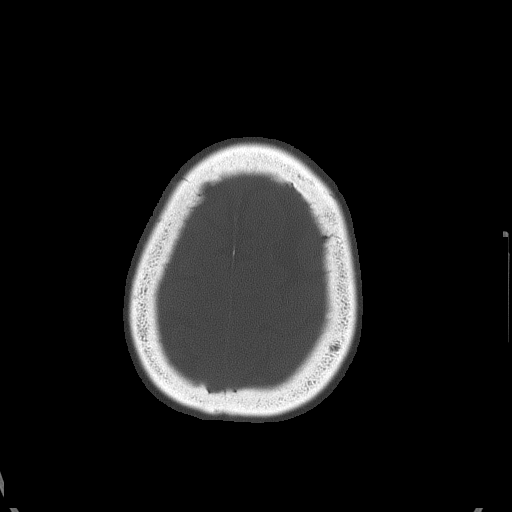

[Series 5: head without cor · coronal · non-contrast · 0.32mm/px · 3 of 71 slices shown]
[im 21/71  brain]
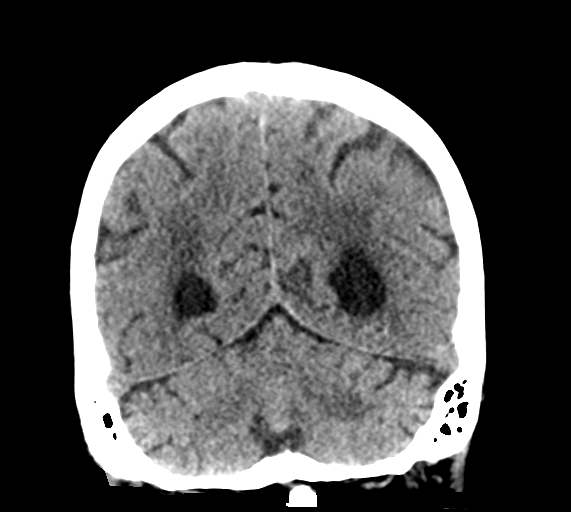
[im 31/71  brain]
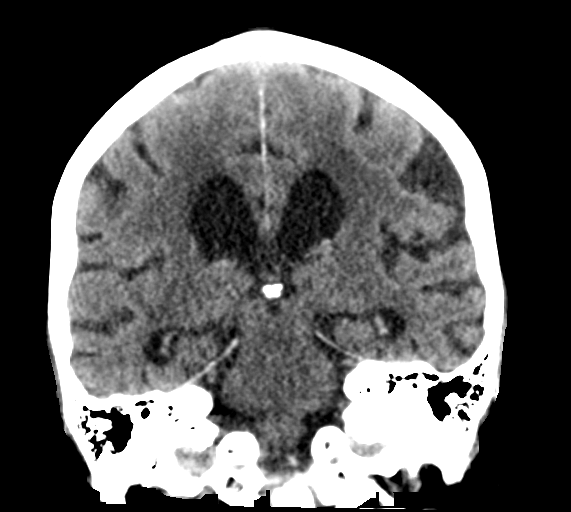
[im 41/71  brain]
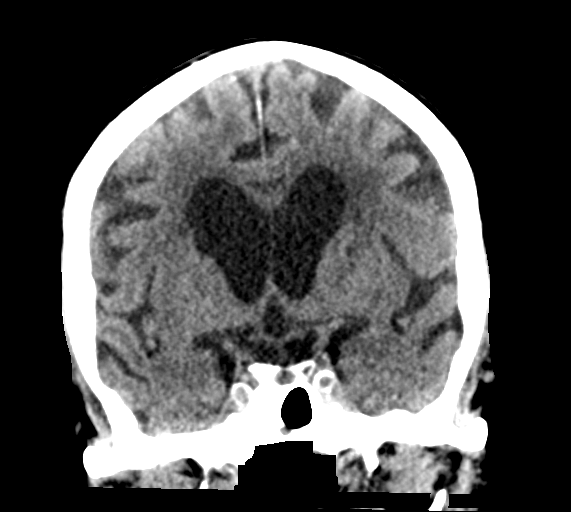

[Series 6: head without sag · sagittal · non-contrast · 0.32mm/px · 2 of 62 slices shown]
[im 21/62  brain]
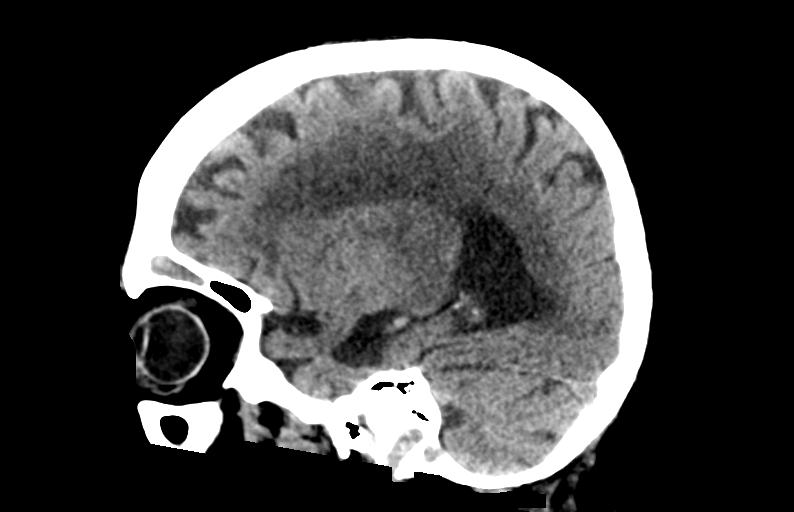
[im 41/62  brain]
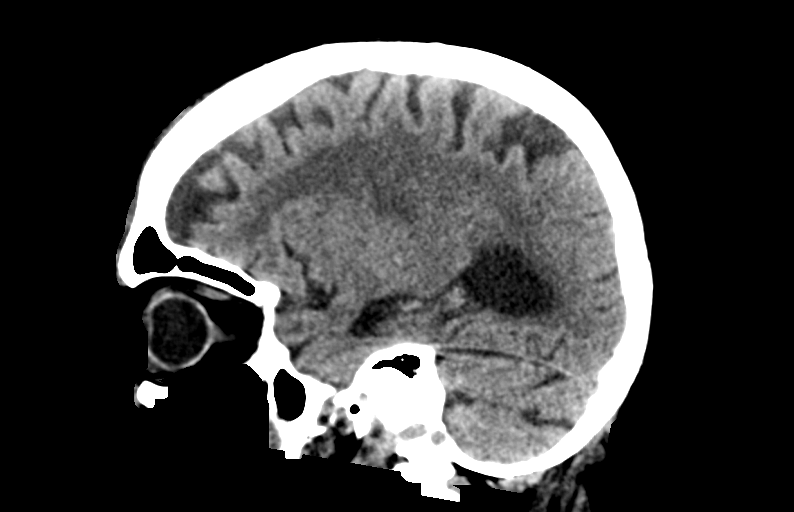

[14 of 47 positions shown; findings below may reference images not displayed]

FINDINGS: CT HEAD FINDINGS

Brain: The ventricles, cisterns and other CSF spaces are within
normal as there is mild age related atrophic change. There is
chronic ischemic microvascular disease. There is no mass, mass
effect, shift of midline structures or acute hemorrhage. There is no
evidence of acute infarction. Stable prominent CSF space over the
periphery of the left cerebellar hemisphere.

Vascular: No hyperdense vessel or unexpected calcification.

Skull: Normal. Negative for fracture or focal lesion.

Sinuses/Orbits: Orbits are normal and symmetric. There is complete
opacification over the right frontal sinus which is worse. Minimal
opacification over the ethmoid air cells and mild mucosal membrane
thickening of the right maxillary sinus. Minimal mucosal membrane
thickening of the sphenoid sinus. Mastoid air cells are clear.

Other: Minimal soft tissue swelling over the left frontal scalp.

CT CERVICAL SPINE FINDINGS

Alignment: Subtle anterior subluxation of C4 on C5 unchanged
compatible degenerate subluxation.

Skull base and vertebrae: Vertebral body heights are maintained.
There is mild to moderate spondylosis throughout the cervical spine.
Atlantoaxial articulation is within normal. There is uncovertebral
joint spurring and facet arthropathy. Bilateral neural foramina
narrowing at multiple levels due to adjacent bony spurring. There is
a displaced fracture involving the posterior and anterior aspect of
the right foramen transversarium of C1. There is slight widening,
irregularity and sclerosis of the joint space between the right
lateral masses of C1 and C2 suggesting this may be chronic injury.
No other fractures identified.

Soft tissues and spinal canal: Prevertebral soft tissues are within
normal. Spinal canal is within normal.

Disc levels: Moderate disc space narrowing at the C5-6 and C6-7
levels.

Upper chest: Within normal.

Other: None.
IMPRESSION: No acute brain injury. Minimal soft tissue swelling left frontal
scalp.

Displaced fracture involving the anterior and posterior aspect of
the right foramen transversarium of C1. This fracture is likely
chronic in nature, although an acute component is not entirely
excluded.

Age related atrophy and chronic ischemic microvascular disease.

Moderate chronic sinus inflammatory disease as described.

Moderate spondylosis of the cervical spine with moderate disc
disease at the C5-6 and C6-7 levels. Multilevel bilateral neural
foraminal narrowing as described.

These results were called by telephone at the time of interpretation
on 07/24/2017 at [DATE] to Dr. CARLOZ SIMARMATA CHABRILLIAT , who verbally
acknowledged these results.

## 2018-04-05 ENCOUNTER — Telehealth: Payer: Self-pay | Admitting: Pulmonary Disease

## 2018-04-05 DIAGNOSIS — G301 Alzheimer's disease with late onset: Secondary | ICD-10-CM | POA: Diagnosis not present

## 2018-04-05 DIAGNOSIS — R531 Weakness: Secondary | ICD-10-CM | POA: Diagnosis not present

## 2018-04-05 DIAGNOSIS — R293 Abnormal posture: Secondary | ICD-10-CM | POA: Diagnosis not present

## 2018-04-05 DIAGNOSIS — J383 Other diseases of vocal cords: Secondary | ICD-10-CM | POA: Diagnosis not present

## 2018-04-05 DIAGNOSIS — R102 Pelvic and perineal pain: Secondary | ICD-10-CM | POA: Diagnosis not present

## 2018-04-05 DIAGNOSIS — R061 Stridor: Secondary | ICD-10-CM | POA: Diagnosis not present

## 2018-04-05 DIAGNOSIS — R062 Wheezing: Secondary | ICD-10-CM | POA: Diagnosis not present

## 2018-04-05 DIAGNOSIS — M818 Other osteoporosis without current pathological fracture: Secondary | ICD-10-CM | POA: Diagnosis not present

## 2018-04-05 DIAGNOSIS — R498 Other voice and resonance disorders: Secondary | ICD-10-CM | POA: Diagnosis not present

## 2018-04-05 DIAGNOSIS — M6389 Disorders of muscle in diseases classified elsewhere, multiple sites: Secondary | ICD-10-CM | POA: Diagnosis not present

## 2018-04-05 NOTE — Telephone Encounter (Signed)
Prednisone 20mg  daily x5 days Please let them know that we are working on the [employer]

## 2018-04-05 NOTE — Telephone Encounter (Signed)
Spoke with [place], informed of order per MD McQuaid. Verbal order taken over phone. Voiced understanding. Nothing further needed at this time.

## 2018-04-05 NOTE — Telephone Encounter (Signed)
Called and spoke to [place] with [facility].  Misty states she visited with pt this morning. Pt is experiencing increased wheezing, non prod cough & increased sob x4-5d Recent course of prednisone on 03/14/18. spo2 and temp were within normal range per [employer]. spO2-94% and temp of 98.6. Offered OV- misty was unsure if pt could make OV, as pt is currently ina long care facility Also attempted to contact pt's daughter, Nunzio Cobbs to see of OV is possible- no answer with no option to leave vm.  BQ please advise. Thanks.

## 2018-04-06 ENCOUNTER — Telehealth: Payer: Self-pay | Admitting: Pulmonary Disease

## 2018-04-06 DIAGNOSIS — M1711 Unilateral primary osteoarthritis, right knee: Secondary | ICD-10-CM | POA: Diagnosis not present

## 2018-04-06 NOTE — Telephone Encounter (Signed)
1 prefilled syringe Ordered Date: 04/06/18 Shipping Date: [name] from [employer]., I returned her call she said " We will send her Dupixent to your office next week. Will wait for med to come in.

## 2018-04-06 NOTE — Telephone Encounter (Signed)
Returned [employer], she said they would get [employer] Dupixent to [place] next week. Lelan Pons notified pt's daughter. Nothing further needed.

## 2018-04-06 NOTE — Telephone Encounter (Signed)
Routing to [employer].

## 2018-04-07 ENCOUNTER — Telehealth: Payer: Self-pay | Admitting: Pulmonary Disease

## 2018-04-07 ENCOUNTER — Ambulatory Visit (INDEPENDENT_AMBULATORY_CARE_PROVIDER_SITE_OTHER): Payer: Medicare Other

## 2018-04-07 DIAGNOSIS — J4540 Moderate persistent asthma, uncomplicated: Secondary | ICD-10-CM | POA: Diagnosis not present

## 2018-04-07 MED ORDER — OMALIZUMAB 150 MG ~~LOC~~ SOLR
300.0000 mg | Freq: Once | SUBCUTANEOUS | Status: AC
  Administered 2018-04-07: 300 mg via SUBCUTANEOUS

## 2018-04-07 NOTE — Telephone Encounter (Signed)
Spoke with daughter Schuler, per [place] she wanted to speak with BQ regarding schedule for patients Xolair and Dupixent injections. Last Xolair injection was today 04/07/2018, next one scheduled is Sept 19th. Daughter would like to know when to start the Dupixent injections. Will route to [employer] as FYI.   BQ please advise.

## 2018-04-08 DIAGNOSIS — M818 Other osteoporosis without current pathological fracture: Secondary | ICD-10-CM | POA: Diagnosis not present

## 2018-04-08 DIAGNOSIS — M6389 Disorders of muscle in diseases classified elsewhere, multiple sites: Secondary | ICD-10-CM | POA: Diagnosis not present

## 2018-04-08 DIAGNOSIS — R293 Abnormal posture: Secondary | ICD-10-CM | POA: Diagnosis not present

## 2018-04-08 DIAGNOSIS — G301 Alzheimer's disease with late onset: Secondary | ICD-10-CM | POA: Diagnosis not present

## 2018-04-08 DIAGNOSIS — R498 Other voice and resonance disorders: Secondary | ICD-10-CM | POA: Diagnosis not present

## 2018-04-08 DIAGNOSIS — J383 Other diseases of vocal cords: Secondary | ICD-10-CM | POA: Diagnosis not present

## 2018-04-11 DIAGNOSIS — M6389 Disorders of muscle in diseases classified elsewhere, multiple sites: Secondary | ICD-10-CM | POA: Diagnosis not present

## 2018-04-11 DIAGNOSIS — R293 Abnormal posture: Secondary | ICD-10-CM | POA: Diagnosis not present

## 2018-04-11 DIAGNOSIS — J383 Other diseases of vocal cords: Secondary | ICD-10-CM | POA: Diagnosis not present

## 2018-04-11 DIAGNOSIS — G301 Alzheimer's disease with late onset: Secondary | ICD-10-CM | POA: Diagnosis not present

## 2018-04-11 DIAGNOSIS — M818 Other osteoporosis without current pathological fracture: Secondary | ICD-10-CM | POA: Diagnosis not present

## 2018-04-11 DIAGNOSIS — R498 Other voice and resonance disorders: Secondary | ICD-10-CM | POA: Diagnosis not present

## 2018-04-11 NOTE — Telephone Encounter (Signed)
I would like for her to receive the Dupixent 10 days after the Xolair.  Then plan on no further Xolair.

## 2018-04-11 NOTE — Telephone Encounter (Signed)
Spoke with Wynona Canes and advised message from Dr. Lake Bells. She understood and nothing further is needed.

## 2018-04-11 NOTE — Telephone Encounter (Signed)
Per 9/4 phone note, it appears that [place] [facility] has been approved.  Dupixent is to be shipped to the office this week, no specific date documented per Alroy Bailiff on other 9/4 phone note.   Pt's next scheduled Xolair injection is 04/21/18.  Pt has an appt with BQ on 04/20/18.  BQ just to clarify: is it ok for pt to start getting both Xolair and Dupixent injections on 9/19?  Also, if this is pt's first Dupixent injection, does she still need to keep the appt the day before? Thanks!

## 2018-04-11 NOTE — Telephone Encounter (Signed)
Need approval for it first

## 2018-04-12 DIAGNOSIS — R293 Abnormal posture: Secondary | ICD-10-CM | POA: Diagnosis not present

## 2018-04-12 DIAGNOSIS — R498 Other voice and resonance disorders: Secondary | ICD-10-CM | POA: Diagnosis not present

## 2018-04-12 DIAGNOSIS — M6389 Disorders of muscle in diseases classified elsewhere, multiple sites: Secondary | ICD-10-CM | POA: Diagnosis not present

## 2018-04-12 DIAGNOSIS — G301 Alzheimer's disease with late onset: Secondary | ICD-10-CM | POA: Diagnosis not present

## 2018-04-12 DIAGNOSIS — J383 Other diseases of vocal cords: Secondary | ICD-10-CM | POA: Diagnosis not present

## 2018-04-12 DIAGNOSIS — M818 Other osteoporosis without current pathological fracture: Secondary | ICD-10-CM | POA: Diagnosis not present

## 2018-04-12 NOTE — Telephone Encounter (Signed)
1 prefilled syringe Arrival Date: 04/12/18 Lot #:[id number] Exp date: 09/2019

## 2018-04-13 DIAGNOSIS — M1711 Unilateral primary osteoarthritis, right knee: Secondary | ICD-10-CM | POA: Diagnosis not present

## 2018-04-13 NOTE — Telephone Encounter (Addendum)
Hey BQ, [name] dob 2023-09-07, has one dose of Xolair left. Can I give her that last dose and then stop the xolair? Please advise. Will route to BQ to f/u up on.

## 2018-04-14 DIAGNOSIS — J383 Other diseases of vocal cords: Secondary | ICD-10-CM | POA: Diagnosis not present

## 2018-04-14 DIAGNOSIS — R293 Abnormal posture: Secondary | ICD-10-CM | POA: Diagnosis not present

## 2018-04-14 DIAGNOSIS — M818 Other osteoporosis without current pathological fracture: Secondary | ICD-10-CM | POA: Diagnosis not present

## 2018-04-14 DIAGNOSIS — R498 Other voice and resonance disorders: Secondary | ICD-10-CM | POA: Diagnosis not present

## 2018-04-14 DIAGNOSIS — G301 Alzheimer's disease with late onset: Secondary | ICD-10-CM | POA: Diagnosis not present

## 2018-04-14 DIAGNOSIS — M6389 Disorders of muscle in diseases classified elsewhere, multiple sites: Secondary | ICD-10-CM | POA: Diagnosis not present

## 2018-04-14 NOTE — Telephone Encounter (Signed)
yes

## 2018-04-14 NOTE — Telephone Encounter (Signed)
Will route info to [employer] as she manages the biologics. Nothing further needed from triage at this time.

## 2018-04-15 ENCOUNTER — Telehealth: Payer: Self-pay | Admitting: Pulmonary Disease

## 2018-04-15 NOTE — Telephone Encounter (Signed)
I called Susanne back. She wanted to know if Mrs. Harrell could get her dupixent shot when she sees BQ.  I explained to her her mother has a dose of xolair on hand. He said she could take that and then start [place] 10 days later.. Made pt an appt for 05/02/18 at 2:00. Her daughter is aware of 2 hr wait and to bring pt's Epi-Pen. Nothing further needed.

## 2018-04-18 DIAGNOSIS — M6389 Disorders of muscle in diseases classified elsewhere, multiple sites: Secondary | ICD-10-CM | POA: Diagnosis not present

## 2018-04-18 DIAGNOSIS — M818 Other osteoporosis without current pathological fracture: Secondary | ICD-10-CM | POA: Diagnosis not present

## 2018-04-18 DIAGNOSIS — R498 Other voice and resonance disorders: Secondary | ICD-10-CM | POA: Diagnosis not present

## 2018-04-18 DIAGNOSIS — R293 Abnormal posture: Secondary | ICD-10-CM | POA: Diagnosis not present

## 2018-04-18 DIAGNOSIS — J383 Other diseases of vocal cords: Secondary | ICD-10-CM | POA: Diagnosis not present

## 2018-04-18 DIAGNOSIS — G301 Alzheimer's disease with late onset: Secondary | ICD-10-CM | POA: Diagnosis not present

## 2018-04-20 ENCOUNTER — Encounter: Payer: Self-pay | Admitting: Pulmonary Disease

## 2018-04-20 ENCOUNTER — Ambulatory Visit (INDEPENDENT_AMBULATORY_CARE_PROVIDER_SITE_OTHER): Payer: Medicare Other | Admitting: Pulmonary Disease

## 2018-04-20 VITALS — BP 120/72 | HR 66

## 2018-04-20 DIAGNOSIS — J4540 Moderate persistent asthma, uncomplicated: Secondary | ICD-10-CM | POA: Diagnosis not present

## 2018-04-20 DIAGNOSIS — Z23 Encounter for immunization: Secondary | ICD-10-CM | POA: Diagnosis not present

## 2018-04-20 DIAGNOSIS — J383 Other diseases of vocal cords: Secondary | ICD-10-CM

## 2018-04-20 DIAGNOSIS — K219 Gastro-esophageal reflux disease without esophagitis: Secondary | ICD-10-CM | POA: Diagnosis not present

## 2018-04-20 DIAGNOSIS — J4551 Severe persistent asthma with (acute) exacerbation: Secondary | ICD-10-CM

## 2018-04-20 NOTE — Patient Instructions (Signed)
Severe persistent asthma: Last Xolair injection tomorrow September 19 First Dupixent injection September 23 Continue Perforomist twice a day Continue budesonide twice a day Keep using albuterol as needed for chest tightness wheezing or shortness of breath High-dose flu shot today  Gastroesophageal reflux disease: Continue taking omeprazole twice a day  Follow-up with me in about 6 weeks

## 2018-04-20 NOTE — Progress Notes (Signed)
Subjective:   PATIENT ID: [name] GENDER: female DOB: July 19, 1924, MRN: [id number]  Synopsis:Former patient of Dr. Ashok Cordia with asthma who has dementia.  She lives in [place].    HPI  Chief Complaint  Patient presents with  . Follow-up    pt reports that breathing treatments are helping   And has been on more prednisone recently because of wheezing.  She says that this is always helpful and most recently she says she has not been feeling very short of breath but that is because she has been taking more prednisone.  She says that she continue to take her inhaled medicines.  She has not had any trouble breathing.  No recent fevers or chills.  Past Medical History:  Diagnosis Date  . Arthritis   . Asthma   . Depression   . Environmental allergies    mold  . Fall   . Hyperlipidemia   . Hypertension   . Memory loss   . Seasonal allergies      Review of Systems  Constitutional: Negative for chills, diaphoresis and malaise/fatigue.  HENT: Negative for sinus pain and sore throat.   Respiratory: Positive for cough and wheezing. Negative for sputum production and stridor.   Cardiovascular: Negative for chest pain, claudication and leg swelling.      Objective:  Physical Exam   Vitals:   04/20/18 1530  BP: 120/72  Pulse: 66  SpO2: 98%   RA  Gen: chronically ill appearing HENT: OP clear, TM's clear, neck supple PULM: Wheezing bilaterally, normal percussion CV: RRR, no mgr, trace edema GI: BS+, soft, nontender Derm: no cyanosis or rash Psyche: normal mood and affect      CBC    Component Value Date/Time   WBC 7.2 02/02/2018 1031   RBC 3.88 02/02/2018 1031   HGB 12.0 02/02/2018 1031   HCT 34.9 (L) 02/02/2018 1031   PLT 272.0 02/02/2018 1031   MCV 89.8 02/02/2018 1031   MCH 30.3 07/24/2017 1822   MCHC 34.4 02/02/2018 1031   RDW 15.3 02/02/2018 1031   LYMPHSABS 0.9 02/02/2018 1031   MONOABS 0.5 02/02/2018 1031   EOSABS 0.7 02/02/2018 1031   BASOSABS 0.0 02/02/2018 1031     PFT 03/29/06: FVC 2.20 L (93%) FEV1 1.45 L (91%) FEV1/FVC 0.66 FEF 25-75 0.74 L (40%) negative bronchodilator response TLC 3.80 L (88%) RV 84% ERV 42% DLCO uncorrected 83%  IMAGING CXR PA/LAT 04/26/17:  No opacity or mass appreciated. No pleural effusion. Heart normal in size & mediastinum normal in contour.  LABS 10/10/8 IgE:  490.7       Assessment & Plan:   Extrinsic asthma, moderate persistent, uncomplicated  Severe persistent asthma with acute exacerbation  Vocal cord dysfunction  Gastroesophageal reflux disease, esophagitis presence not specified  Discussion: Severe persistent asthma with recurrent exacerbations.  This continues to be a problem for her and she is now near prednisone dependent.  We will plan to change to Dupixent as soon as possible.  She will have a Xolair injection on September 19 and then she will start [place] on September 23 with no plans for further Xolair injections.  Plan: Severe persistent asthma: Last Xolair injection tomorrow September 19 First Dupixent injection September 23 Continue Perforomist twice a day Continue budesonide twice a day Keep using albuterol as needed for chest tightness wheezing or shortness of breath High-dose flu shot today  Gastroesophageal reflux disease: Continue taking omeprazole twice a day  Follow-up with me in about  6 weeks  > 50% of this 25 minute visit spent face to face   Current Outpatient Medications:  .  acetaminophen (TYLENOL) 325 MG tablet, Take 650 mg by mouth every 4 (four) hours as needed (pain)., Disp: , Rfl:  .  albuterol (PROVENTIL HFA;VENTOLIN HFA) 108 (90 Base) MCG/ACT inhaler, Inhale 1-2 puffs into the lungs every 6 (six) hours as needed for wheezing or shortness of breath., Disp: 1 Inhaler, Rfl: 6 .  albuterol (PROVENTIL) (2.5 MG/3ML) 0.083% nebulizer solution, Take 3 mLs (2.5 mg total) by nebulization every 4 (four) hours as needed for wheezing or  shortness of breath., Disp: 120 mL, Rfl: 3 .  budesonide (PULMICORT) 0.5 MG/2ML nebulizer solution, Take 2 mLs (0.5 mg total) by nebulization 2 (two) times daily., Disp: 60 mL, Rfl: 6 .  Calcium Carb-Cholecalciferol (CALCIUM 600-D PO), Take 1 tablet by mouth daily., Disp: , Rfl:  .  cetirizine (ZYRTEC) 10 MG tablet, Take 10 mg by mouth at bedtime., Disp: , Rfl:  .  Cholecalciferol (VITAMIN D) 2000 units tablet, Take 2,000 Units by mouth at bedtime., Disp: , Rfl:  .  dextromethorphan (DELSYM) 30 MG/5ML liquid, Take 60 mg by mouth every 12 (twelve) hours as needed for cough., Disp: , Rfl:  .  dextromethorphan-guaiFENesin (MUCINEX DM) 30-600 MG 12hr tablet, Take 1 tablet by mouth 2 (two) times daily as needed for cough (congestion). , Disp: , Rfl:  .  diphenhydramine-acetaminophen (TYLENOL PM) 25-500 MG TABS tablet, Take 1 tablet by mouth at bedtime. , Disp: , Rfl:  .  docusate sodium (COLACE) 100 MG capsule, Take 100 mg by mouth at bedtime. , Disp: , Rfl:  .  donepezil (ARICEPT) 5 MG tablet, Take 0.5 tablets (2.5 mg total) by mouth at bedtime., Disp: 30 tablet, Rfl: 8 .  FLUoxetine (PROZAC) 20 MG capsule, Take 40 mg by mouth daily. , Disp: , Rfl:  .  fluticasone (FLONASE) 50 MCG/ACT nasal spray, instill 2 sprays into each nostril once daily (Patient taking differently: Place 2 sprays into both nostrils daily. ), Disp: 16 g, Rfl: 6 .  formoterol (PERFOROMIST) 20 MCG/2ML nebulizer solution, Take 2 mLs (20 mcg total) by nebulization 2 (two) times daily., Disp: 60 mL, Rfl: 6 .  furosemide (LASIX) 20 MG tablet, Take 40 mg by mouth daily. , Disp: , Rfl:  .  Heat Wraps (THERMACARE BACK/HIP) MISC, Place 1 patch onto the skin as needed. Not to be worn more than 8 hours, Disp: , Rfl:  .  ipratropium-albuterol (DUONEB) 0.5-2.5 (3) MG/3ML SOLN, Take 3 mLs by nebulization every 4 (four) hours as needed (shortness of breath/wheezing)., Disp: , Rfl:  .  Liniments (SALONPAS PAIN RELIEF PATCH EX), Place 1 patch onto  the skin daily as needed (neck pain (remove patch after 12 hours))., Disp: , Rfl:  .  memantine (NAMENDA) 10 MG tablet, Take 1 tablet (10 mg total) by mouth 2 (two) times daily., Disp: 60 tablet, Rfl: 11 .  montelukast (SINGULAIR) 10 MG tablet, Take 1 tablet (10 mg total) by mouth daily. (Patient taking differently: Take 10 mg by mouth at bedtime. ), Disp: 30 tablet, Rfl: 6 .  olmesartan (BENICAR) 40 MG tablet, Take 40 mg by mouth daily., Disp: , Rfl:  .  omeprazole (PRILOSEC) 20 MG capsule, Take 20 mg by mouth daily before breakfast., Disp: , Rfl:  .  polyethylene glycol (MIRALAX / GLYCOLAX) packet, Take 17 g by mouth daily., Disp: , Rfl:  .  predniSONE (DELTASONE) 10 MG tablet, Take 2  tablets daily for 5 days, Disp: 10 tablet, Rfl: 0 .  spiritus frumenti (ETHYL ALCOHOL) SOLN, Take 1 each by mouth See admin instructions. May have 1-2 glasses of wine every evening(4 oz each) for a total of 8 oz daily, Disp: , Rfl:  .  vitamin B-12 (CYANOCOBALAMIN) 1000 MCG tablet, Take 3,000 mcg by mouth daily., Disp: , Rfl:

## 2018-04-21 ENCOUNTER — Ambulatory Visit (INDEPENDENT_AMBULATORY_CARE_PROVIDER_SITE_OTHER): Payer: Medicare Other

## 2018-04-21 DIAGNOSIS — M818 Other osteoporosis without current pathological fracture: Secondary | ICD-10-CM | POA: Diagnosis not present

## 2018-04-21 DIAGNOSIS — J383 Other diseases of vocal cords: Secondary | ICD-10-CM | POA: Diagnosis not present

## 2018-04-21 DIAGNOSIS — R498 Other voice and resonance disorders: Secondary | ICD-10-CM | POA: Diagnosis not present

## 2018-04-21 DIAGNOSIS — G301 Alzheimer's disease with late onset: Secondary | ICD-10-CM | POA: Diagnosis not present

## 2018-04-21 DIAGNOSIS — R293 Abnormal posture: Secondary | ICD-10-CM | POA: Diagnosis not present

## 2018-04-21 DIAGNOSIS — M6389 Disorders of muscle in diseases classified elsewhere, multiple sites: Secondary | ICD-10-CM | POA: Diagnosis not present

## 2018-04-21 DIAGNOSIS — J4540 Moderate persistent asthma, uncomplicated: Secondary | ICD-10-CM | POA: Diagnosis not present

## 2018-04-21 MED ORDER — OMALIZUMAB 150 MG ~~LOC~~ SOLR
300.0000 mg | SUBCUTANEOUS | Status: DC
  Administered 2018-04-21: 300 mg via SUBCUTANEOUS

## 2018-04-21 NOTE — Progress Notes (Signed)
Documentation of medication administration and charges of Xolair have been completed by Janaiya Beauchesne, CMA based on the hand written Xolair documentation sheet completed by Tammy Scott, who administered the medication.  

## 2018-04-25 ENCOUNTER — Ambulatory Visit (INDEPENDENT_AMBULATORY_CARE_PROVIDER_SITE_OTHER): Payer: Medicare Other

## 2018-04-25 DIAGNOSIS — R498 Other voice and resonance disorders: Secondary | ICD-10-CM | POA: Diagnosis not present

## 2018-04-25 DIAGNOSIS — J4540 Moderate persistent asthma, uncomplicated: Secondary | ICD-10-CM | POA: Diagnosis not present

## 2018-04-25 DIAGNOSIS — J383 Other diseases of vocal cords: Secondary | ICD-10-CM | POA: Diagnosis not present

## 2018-04-25 DIAGNOSIS — M818 Other osteoporosis without current pathological fracture: Secondary | ICD-10-CM | POA: Diagnosis not present

## 2018-04-25 DIAGNOSIS — M6389 Disorders of muscle in diseases classified elsewhere, multiple sites: Secondary | ICD-10-CM | POA: Diagnosis not present

## 2018-04-25 DIAGNOSIS — R293 Abnormal posture: Secondary | ICD-10-CM | POA: Diagnosis not present

## 2018-04-25 DIAGNOSIS — G301 Alzheimer's disease with late onset: Secondary | ICD-10-CM | POA: Diagnosis not present

## 2018-04-25 MED ORDER — DUPILUMAB 300 MG/2ML ~~LOC~~ SOSY
600.0000 mg | PREFILLED_SYRINGE | Freq: Once | SUBCUTANEOUS | Status: AC
  Administered 2018-04-25: 600 mg via SUBCUTANEOUS

## 2018-04-26 DIAGNOSIS — M6389 Disorders of muscle in diseases classified elsewhere, multiple sites: Secondary | ICD-10-CM | POA: Diagnosis not present

## 2018-04-26 DIAGNOSIS — M818 Other osteoporosis without current pathological fracture: Secondary | ICD-10-CM | POA: Diagnosis not present

## 2018-04-26 DIAGNOSIS — G301 Alzheimer's disease with late onset: Secondary | ICD-10-CM | POA: Diagnosis not present

## 2018-04-26 DIAGNOSIS — J383 Other diseases of vocal cords: Secondary | ICD-10-CM | POA: Diagnosis not present

## 2018-04-26 DIAGNOSIS — R498 Other voice and resonance disorders: Secondary | ICD-10-CM | POA: Diagnosis not present

## 2018-04-26 DIAGNOSIS — R293 Abnormal posture: Secondary | ICD-10-CM | POA: Diagnosis not present

## 2018-04-28 DIAGNOSIS — R293 Abnormal posture: Secondary | ICD-10-CM | POA: Diagnosis not present

## 2018-04-28 DIAGNOSIS — R498 Other voice and resonance disorders: Secondary | ICD-10-CM | POA: Diagnosis not present

## 2018-04-28 DIAGNOSIS — J383 Other diseases of vocal cords: Secondary | ICD-10-CM | POA: Diagnosis not present

## 2018-04-28 DIAGNOSIS — G301 Alzheimer's disease with late onset: Secondary | ICD-10-CM | POA: Diagnosis not present

## 2018-04-28 DIAGNOSIS — M818 Other osteoporosis without current pathological fracture: Secondary | ICD-10-CM | POA: Diagnosis not present

## 2018-04-28 DIAGNOSIS — M6389 Disorders of muscle in diseases classified elsewhere, multiple sites: Secondary | ICD-10-CM | POA: Diagnosis not present

## 2018-05-02 ENCOUNTER — Ambulatory Visit: Payer: Self-pay

## 2018-05-04 ENCOUNTER — Telehealth: Payer: Self-pay | Admitting: Pulmonary Disease

## 2018-05-04 DIAGNOSIS — R061 Stridor: Secondary | ICD-10-CM | POA: Diagnosis not present

## 2018-05-04 DIAGNOSIS — J383 Other diseases of vocal cords: Secondary | ICD-10-CM | POA: Diagnosis not present

## 2018-05-04 DIAGNOSIS — R062 Wheezing: Secondary | ICD-10-CM | POA: Diagnosis not present

## 2018-05-04 DIAGNOSIS — R498 Other voice and resonance disorders: Secondary | ICD-10-CM | POA: Diagnosis not present

## 2018-05-04 NOTE — Telephone Encounter (Deleted)
Created in error

## 2018-05-04 NOTE — Telephone Encounter (Signed)
1 prefilled syringe Ordered Date: 05/04/18 Shipping Date: 05/04/18

## 2018-05-05 DIAGNOSIS — J383 Other diseases of vocal cords: Secondary | ICD-10-CM | POA: Diagnosis not present

## 2018-05-05 DIAGNOSIS — R498 Other voice and resonance disorders: Secondary | ICD-10-CM | POA: Diagnosis not present

## 2018-05-05 DIAGNOSIS — R062 Wheezing: Secondary | ICD-10-CM | POA: Diagnosis not present

## 2018-05-05 DIAGNOSIS — R061 Stridor: Secondary | ICD-10-CM | POA: Diagnosis not present

## 2018-05-05 NOTE — Telephone Encounter (Signed)
1 prefilled syringe Arrival Date: 05/05/18 Lot #:[id number] Exp date: 01/2020

## 2018-05-09 DIAGNOSIS — R531 Weakness: Secondary | ICD-10-CM | POA: Diagnosis not present

## 2018-05-09 DIAGNOSIS — N183 Chronic kidney disease, stage 3 (moderate): Secondary | ICD-10-CM | POA: Diagnosis not present

## 2018-05-09 DIAGNOSIS — D649 Anemia, unspecified: Secondary | ICD-10-CM | POA: Diagnosis not present

## 2018-05-10 ENCOUNTER — Telehealth: Payer: Self-pay | Admitting: Pulmonary Disease

## 2018-05-10 ENCOUNTER — Ambulatory Visit (INDEPENDENT_AMBULATORY_CARE_PROVIDER_SITE_OTHER): Payer: Medicare Other

## 2018-05-10 DIAGNOSIS — N390 Urinary tract infection, site not specified: Secondary | ICD-10-CM | POA: Diagnosis not present

## 2018-05-10 DIAGNOSIS — R531 Weakness: Secondary | ICD-10-CM | POA: Diagnosis not present

## 2018-05-10 DIAGNOSIS — R319 Hematuria, unspecified: Secondary | ICD-10-CM | POA: Diagnosis not present

## 2018-05-10 DIAGNOSIS — N183 Chronic kidney disease, stage 3 (moderate): Secondary | ICD-10-CM | POA: Diagnosis not present

## 2018-05-10 DIAGNOSIS — J4540 Moderate persistent asthma, uncomplicated: Secondary | ICD-10-CM | POA: Diagnosis not present

## 2018-05-10 MED ORDER — DUPILUMAB 300 MG/2ML ~~LOC~~ SOSY
300.0000 mg | PREFILLED_SYRINGE | Freq: Once | SUBCUTANEOUS | Status: AC
  Administered 2018-05-10: 300 mg via SUBCUTANEOUS

## 2018-05-10 NOTE — Progress Notes (Signed)
Documented by Parke Poisson CMA based on hand-written Dupixent documentation sheet completed by [facility] CMA, who administered the medication.

## 2018-05-10 NOTE — Telephone Encounter (Signed)
T[facility] and [facility] as a whole, want to know if pt can start getting her shots there. It would be more convinient. Or had you rather her continue to take Dupixent here? Please advise. Routing to [employer].

## 2018-05-12 ENCOUNTER — Other Ambulatory Visit: Payer: Self-pay | Admitting: Pulmonary Disease

## 2018-05-12 NOTE — Telephone Encounter (Signed)
Routing biologic to TS

## 2018-05-13 NOTE — Telephone Encounter (Signed)
I want her to get three shots with [place], then she can change to receive them there

## 2018-05-13 NOTE — Telephone Encounter (Signed)
Routing to [employer].

## 2018-05-17 NOTE — Telephone Encounter (Signed)
Noted. I will call [facility] and make them aware. Then I'll call her daughter to make her aware. Nothing further needed. Thank you!

## 2018-05-25 ENCOUNTER — Telehealth: Payer: Self-pay | Admitting: Pulmonary Disease

## 2018-05-25 NOTE — Telephone Encounter (Signed)
Misty with [facility], [phone], calling to check on [place] as well and if they will be giving injection and when to start.

## 2018-05-25 NOTE — Telephone Encounter (Signed)
I called Misty, and set pt's 3rd and last appt with [place] 05/26/18 at 11:15. I also called pt's daughter and lmom. I let her know I called Misty and set her mother's last appt. up here. After tomorrow she will be able to get her injections at [facility].  I called [facility] to make sure they knew she will be getting her injections from [facility] from now on. At their skilled nursing facility. Yes! Nothing further needed.

## 2018-05-26 ENCOUNTER — Ambulatory Visit (INDEPENDENT_AMBULATORY_CARE_PROVIDER_SITE_OTHER): Payer: Medicare Other

## 2018-05-26 DIAGNOSIS — J4540 Moderate persistent asthma, uncomplicated: Secondary | ICD-10-CM

## 2018-05-26 MED ORDER — DUPILUMAB 300 MG/2ML ~~LOC~~ SOSY
300.0000 mg | PREFILLED_SYRINGE | Freq: Once | SUBCUTANEOUS | Status: AC
  Administered 2018-05-26: 300 mg via SUBCUTANEOUS

## 2018-05-26 NOTE — Progress Notes (Signed)
Documented by Parke Poisson CMA based on hand-written Dupixent documentation sheet completed by [facility] CMA, who administered the medication.  Patient answered the following questions per the hand-written documentation on the Dupixent Injection sheet completed by Alroy Bailiff CMA, who administered the medication:  Have you had a change in your insurance?  NO. Have you been in the hospital in the past 10 days?  NO. Do you have a fever?  NO. Do you have a cough?  YES.  Dry.

## 2018-05-30 ENCOUNTER — Telehealth: Payer: Self-pay | Admitting: Pulmonary Disease

## 2018-05-31 NOTE — Telephone Encounter (Signed)
OK 

## 2018-05-31 NOTE — Telephone Encounter (Signed)
Toy Baker an Dr. Rosilyn Mings at [facility] needs a verbal or written order for [name]'s dob 04-20-1924 Dupixent. [facility] ph. # [phone], Fax #: [phone]. Since [facility] will be taking over her [place] therapy.  [facility] is the Pharmacy, their ph.# [phone], fax #: [phone]. I called them last wk. They are aware pt will be getting her med at [facility] now. Dupixent 300mg  given SubQ q 2 wks.Otho Darner: One 2 pk. (Two 2 ml syringes) For a 28 days supply. Routing this info. To, C. Potts and BQ.

## 2018-05-31 NOTE — Telephone Encounter (Signed)
Tammy, please advise on this for pt. Thanks!

## 2018-05-31 NOTE — Telephone Encounter (Signed)
Thank You.

## 2018-05-31 NOTE — Telephone Encounter (Signed)
[address] [place], [place].  States does not have order for patient's Dupixent to start giving her this at [facility] and is calling to see if she needs to come in the office for next injection.

## 2018-06-01 NOTE — Telephone Encounter (Signed)
Called and gave order to [facility]. Nothing further needed at this time.

## 2018-06-01 NOTE — Telephone Encounter (Signed)
Please either call Rojelio Brenner [phone] and give her a verbal order or fax the order to [phone]. [facility] needs an order before they can start Mrs. Kakos's Dupixent shots there. I just spoke with [facility], they do not have an order yet. Please refer to the note I sent you before your response yesterday. Will route to BQ & Burman Nieves.

## 2018-06-01 NOTE — Telephone Encounter (Signed)
TS please advise if the verbal order for pt's Dupixent was sent to [facility] already? Thank you. [facility] ph. # [phone], Fax #: [phone].  Since [facility] will be taking over her [place] therapy.

## 2018-06-06 ENCOUNTER — Ambulatory Visit (INDEPENDENT_AMBULATORY_CARE_PROVIDER_SITE_OTHER): Payer: Medicare Other | Admitting: Pulmonary Disease

## 2018-06-06 ENCOUNTER — Encounter: Payer: Self-pay | Admitting: Pulmonary Disease

## 2018-06-06 VITALS — BP 118/64 | HR 78

## 2018-06-06 DIAGNOSIS — J4550 Severe persistent asthma, uncomplicated: Secondary | ICD-10-CM

## 2018-06-06 NOTE — Progress Notes (Signed)
Subjective:   PATIENT ID: [name] GENDER: female DOB: 1924-07-27, MRN: [id number]  Synopsis:Former patient of Dr. Ashok [name] with asthma who has dementia.  She lives in [place].   Stopped Xolair 04/2018, started Dupixent 04/2018   HPI  Chief Complaint  Patient presents with  . Follow-up    reporting breathing better   [name] has done really well since we started Dupixent injections in September.  She has not had bronchitis or pneumonia and she has not been wheezing.  She is accompanied today by 1 of [facility] nurses from [facility] who says that her breathing has improved significantly since starting the injections.  She has not been having any complications that she is aware of.  There is no redness or soreness at the injection sites.  She has not been using albuterol during the daytime like she had to before.  No problems with sinus congestion.  She does not feel short of breath during the daytime.  Past Medical History:  Diagnosis Date  . Arthritis   . Asthma   . Depression   . Environmental allergies    mold  . Fall   . Hyperlipidemia   . Hypertension   . Memory loss   . Seasonal allergies      Review of Systems  Constitutional: Negative for chills, diaphoresis and malaise/fatigue.  HENT: Negative for sinus pain and sore throat.   Respiratory: Positive for cough and wheezing. Negative for sputum production and stridor.   Cardiovascular: Negative for chest pain, claudication and leg swelling.      Objective:  Physical Exam   Vitals:   06/06/18 0942  BP: 118/64  Pulse: 78  SpO2: 97%   RA  Gen: chronically ill appearing, in wheelchair HENT: OP clear, TM's clear, neck supple PULM: CTA B, normal percussion CV: RRR, no mgr, trace edema GI: BS+, soft, nontender Derm: no cyanosis or rash Psyche: normal mood and affect      CBC    Component Value Date/Time   WBC 7.2 02/02/2018 1031   RBC 3.88 02/02/2018 1031   HGB 12.0 02/02/2018 1031   HCT 34.9 (L)  02/02/2018 1031   PLT 272.0 02/02/2018 1031   MCV 89.8 02/02/2018 1031   MCH 30.3 07/24/2017 1822   MCHC 34.4 02/02/2018 1031   RDW 15.3 02/02/2018 1031   LYMPHSABS 0.9 02/02/2018 1031   MONOABS 0.5 02/02/2018 1031   EOSABS 0.7 02/02/2018 1031   BASOSABS 0.0 02/02/2018 1031     PFT 03/29/06: FVC 2.20 L (93%) FEV1 1.45 L (91%) FEV1/FVC 0.66 FEF 25-75 0.74 L (40%) negative bronchodilator response TLC 3.80 L (88%) RV 84% ERV 42% DLCO uncorrected 83%  IMAGING CXR PA/LAT 04/26/17:  No opacity or mass appreciated. No pleural effusion. Heart normal in size & mediastinum normal in contour.  LABS 10/10/8 IgE:  490.7       Assessment & Plan:   Severe persistent asthma without complication  Discussion: This has been a stable interval for her since we started the injections of Dupixent.  She will continue giving them every 15 days.  At this time there is no evidence of a complication from that medicine.  I will plan on treating her with these medicines for a total of 2 years.  Her immunizations are up-to-date.  Plan: Severe persistent asthma with recurrent exacerbations and eosinophilia: Continue Dupixent injections every 15 days Continue Perforomist Continue Pulmicort Use albuterol as needed for chest tightness wheezing or shortness of breath Continue Singulair  10 mg daily  I will see you back in February 2020 or sooner if needed   Current Outpatient Medications:  .  acetaminophen (TYLENOL) 325 MG tablet, Take 650 mg by mouth every 4 (four) hours as needed (pain)., Disp: , Rfl:  .  albuterol (PROVENTIL HFA;VENTOLIN HFA) 108 (90 Base) MCG/ACT inhaler, Inhale 1-2 puffs into the lungs every 6 (six) hours as needed for wheezing or shortness of breath., Disp: 1 Inhaler, Rfl: 6 .  albuterol (PROVENTIL) (2.5 MG/3ML) 0.083% nebulizer solution, Take 3 mLs (2.5 mg total) by nebulization every 4 (four) hours as needed for wheezing or shortness of breath., Disp: 120 mL, Rfl: 3 .  budesonide  (PULMICORT) 0.5 MG/2ML nebulizer solution, Take 2 mLs (0.5 mg total) by nebulization 2 (two) times daily., Disp: 60 mL, Rfl: 6 .  Calcium Carb-Cholecalciferol (CALCIUM 600-D PO), Take 1 tablet by mouth daily., Disp: , Rfl:  .  cetirizine (ZYRTEC) 10 MG tablet, Take 10 mg by mouth at bedtime., Disp: , Rfl:  .  Cholecalciferol (VITAMIN D) 2000 units tablet, Take 2,000 Units by mouth at bedtime., Disp: , Rfl:  .  dextromethorphan (DELSYM) 30 MG/5ML liquid, Take 60 mg by mouth every 12 (twelve) hours as needed for cough., Disp: , Rfl:  .  dextromethorphan-guaiFENesin (MUCINEX DM) 30-600 MG 12hr tablet, Take 1 tablet by mouth 2 (two) times daily as needed for cough (congestion). , Disp: , Rfl:  .  diphenhydramine-acetaminophen (TYLENOL PM) 25-500 MG TABS tablet, Take 1 tablet by mouth at bedtime. , Disp: , Rfl:  .  docusate sodium (COLACE) 100 MG capsule, Take 100 mg by mouth at bedtime. , Disp: , Rfl:  .  donepezil (ARICEPT) 5 MG tablet, Take 0.5 tablets (2.5 mg total) by mouth at bedtime., Disp: 30 tablet, Rfl: 8 .  DUPIXENT 300 MG/2ML SOSY, Inject 1 pen/syringe subcutaneously every other week STARTING ON DAY 15, Disp: 4 mL, Rfl: 6 .  FLUoxetine (PROZAC) 20 MG capsule, Take 40 mg by mouth daily. , Disp: , Rfl:  .  fluticasone (FLONASE) 50 MCG/ACT nasal spray, instill 2 sprays into each nostril once daily (Patient taking differently: Place 2 sprays into both nostrils daily. ), Disp: 16 g, Rfl: 6 .  formoterol (PERFOROMIST) 20 MCG/2ML nebulizer solution, Take 2 mLs (20 mcg total) by nebulization 2 (two) times daily., Disp: 60 mL, Rfl: 6 .  furosemide (LASIX) 20 MG tablet, Take 40 mg by mouth daily. , Disp: , Rfl:  .  Heat Wraps (THERMACARE BACK/HIP) MISC, Place 1 patch onto the skin as needed. Not to be worn more than 8 hours, Disp: , Rfl:  .  ipratropium-albuterol (DUONEB) 0.5-2.5 (3) MG/3ML SOLN, Take 3 mLs by nebulization every 4 (four) hours as needed (shortness of breath/wheezing)., Disp: , Rfl:  .   Liniments (SALONPAS PAIN RELIEF PATCH EX), Place 1 patch onto the skin daily as needed (neck pain (remove patch after 12 hours))., Disp: , Rfl:  .  memantine (NAMENDA) 10 MG tablet, Take 1 tablet (10 mg total) by mouth 2 (two) times daily., Disp: 60 tablet, Rfl: 11 .  montelukast (SINGULAIR) 10 MG tablet, Take 1 tablet (10 mg total) by mouth daily. (Patient taking differently: Take 10 mg by mouth at bedtime. ), Disp: 30 tablet, Rfl: 6 .  olmesartan (BENICAR) 40 MG tablet, Take 40 mg by mouth daily., Disp: , Rfl:  .  omeprazole (PRILOSEC) 20 MG capsule, Take 20 mg by mouth daily before breakfast., Disp: , Rfl:  .  polyethylene  glycol (MIRALAX / GLYCOLAX) packet, Take 17 g by mouth daily., Disp: , Rfl:  .  spiritus frumenti (ETHYL ALCOHOL) SOLN, Take 1 each by mouth See admin instructions. May have 1-2 glasses of wine every evening(4 oz each) for a total of 8 oz daily, Disp: , Rfl:  .  vitamin B-12 (CYANOCOBALAMIN) 1000 MCG tablet, Take 3,000 mcg by mouth daily., Disp: , Rfl:   Current Facility-Administered Medications:  .  omalizumab Arvid Right) injection 300 mg, 300 mg, Subcutaneous, Q14 Days, ,  B, MD, 300 mg at 04/21/18 1343

## 2018-06-06 NOTE — Patient Instructions (Signed)
Severe persistent asthma with recurrent exacerbations and eosinophilia: Continue Dupixent injections every 15 days Continue Perforomist Continue Pulmicort Use albuterol as needed for chest tightness wheezing or shortness of breath Continue Singulair 10 mg daily  I will see you back in February 2020 or sooner if needed

## 2018-06-16 DIAGNOSIS — M1990 Unspecified osteoarthritis, unspecified site: Secondary | ICD-10-CM | POA: Diagnosis not present

## 2018-06-16 DIAGNOSIS — J383 Other diseases of vocal cords: Secondary | ICD-10-CM | POA: Diagnosis not present

## 2018-06-16 DIAGNOSIS — F331 Major depressive disorder, recurrent, moderate: Secondary | ICD-10-CM | POA: Diagnosis not present

## 2018-06-16 DIAGNOSIS — K219 Gastro-esophageal reflux disease without esophagitis: Secondary | ICD-10-CM | POA: Diagnosis not present

## 2018-06-16 DIAGNOSIS — I10 Essential (primary) hypertension: Secondary | ICD-10-CM | POA: Diagnosis not present

## 2018-06-16 DIAGNOSIS — E871 Hypo-osmolality and hyponatremia: Secondary | ICD-10-CM | POA: Diagnosis not present

## 2018-06-16 DIAGNOSIS — I129 Hypertensive chronic kidney disease with stage 1 through stage 4 chronic kidney disease, or unspecified chronic kidney disease: Secondary | ICD-10-CM | POA: Diagnosis not present

## 2018-06-16 DIAGNOSIS — E559 Vitamin D deficiency, unspecified: Secondary | ICD-10-CM | POA: Diagnosis not present

## 2018-06-16 DIAGNOSIS — J4540 Moderate persistent asthma, uncomplicated: Secondary | ICD-10-CM | POA: Diagnosis not present

## 2018-06-16 DIAGNOSIS — R413 Other amnesia: Secondary | ICD-10-CM | POA: Diagnosis not present

## 2018-06-16 DIAGNOSIS — N183 Chronic kidney disease, stage 3 (moderate): Secondary | ICD-10-CM | POA: Diagnosis not present

## 2018-06-16 DIAGNOSIS — E538 Deficiency of other specified B group vitamins: Secondary | ICD-10-CM | POA: Diagnosis not present

## 2018-09-02 ENCOUNTER — Telehealth: Payer: Self-pay | Admitting: Pulmonary Disease

## 2018-09-02 NOTE — Telephone Encounter (Signed)
Call made to [facility], patient resides at [facility]. She states patient is having increased wheezing despite her giving her routine nebulizer treatments for the past 3 days. Patient states her chest is tight. Patient received her dupixent injection yesterday at the facility, they thought this would help but it still has not improved. She has appt scheduled with [facility] the 5th. She states it would be difficult to get transportation arranged for an earlier appointment. She states normally [facility] will give her prednisone to help with this. Denies any other symptoms. States she does not feel the dupixent is doing a good job at decreasing her exacerbations.   Allergies:  Azithromycinn, Aspirin, and Sulfa meds.   BM please advise.

## 2018-09-02 NOTE — Telephone Encounter (Signed)
Patient needs to keep office visit with Dr. Lake Bells.  Okay to offer:  Prednisone 10mg  tablet  >>>4 tabs for 2 days, then 3 tabs for 2 days, 2 tabs for 2 days, then 1 tab for 2 days, then stop >>>take with food  >>>take in the morning    Please place the order  Wyn Quaker, FNP

## 2018-09-02 NOTE — Telephone Encounter (Signed)
Spoke with Misty at [facility] of prednisone treatment directed by [employer]. She confirmed dosage recommendations and will start patient on prednisone today. Nothing further needed at this time.

## 2018-09-06 ENCOUNTER — Telehealth: Payer: Self-pay | Admitting: Pulmonary Disease

## 2018-09-06 NOTE — Telephone Encounter (Signed)
noted 

## 2018-09-06 NOTE — Telephone Encounter (Signed)
Will route this over to BQ as an FYI. Called and spoke with patient, she stated that she was given a prednisone taper and this is helping but she still has small wheezing. She is seeing BQ tomorrow 09/07/2018 and wanted to make him aware.

## 2018-09-07 ENCOUNTER — Ambulatory Visit (INDEPENDENT_AMBULATORY_CARE_PROVIDER_SITE_OTHER): Payer: Medicare Other | Admitting: Pulmonary Disease

## 2018-09-07 ENCOUNTER — Telehealth: Payer: Self-pay | Admitting: Pulmonary Disease

## 2018-09-07 ENCOUNTER — Encounter: Payer: Self-pay | Admitting: Pulmonary Disease

## 2018-09-07 VITALS — BP 132/68 | HR 65

## 2018-09-07 DIAGNOSIS — K219 Gastro-esophageal reflux disease without esophagitis: Secondary | ICD-10-CM

## 2018-09-07 DIAGNOSIS — J4551 Severe persistent asthma with (acute) exacerbation: Secondary | ICD-10-CM

## 2018-09-07 DIAGNOSIS — J383 Other diseases of vocal cords: Secondary | ICD-10-CM

## 2018-09-07 MED ORDER — PREDNISONE 20 MG PO TABS: 20.0000 mg | ORAL_TABLET | Freq: Every day | ORAL | 0 refills | Status: DC

## 2018-09-07 NOTE — Telephone Encounter (Signed)
OK to finish the prednisone taper as prescribed at [facility], no need to take the 20mg  daily I prescribed. Thanks to [facility] for clarifying

## 2018-09-07 NOTE — Patient Instructions (Signed)
Acute asthma exacerbation: We will check a test today called and exhaled nitric oxide test Take prednisone 20 mg daily x5 days Keep using Dupixent as arranged by your assisted living facility Continue Perforomist twice a day Continue budesonide twice a day Keep using albuterol as needed for chest tightness wheezing or shortness of breath  Gastroesophageal reflux disease: Keep taking omeprazole twice a day  Follow-up with [place] in 4 to 6 weeks or sooner if needed

## 2018-09-07 NOTE — Telephone Encounter (Signed)
[employer], spoke with [place].  Dr [facility] Bells recommendations given.  Nothing further at this time.

## 2018-09-07 NOTE — Telephone Encounter (Signed)
Returned call from [place] at [name]  Misty stated pt is already on a prednisone taper.  She was ordered 40mg  x2 days, 30mg  x2 days, 20mg  x2 days, and then 10mg  x2 days.  She is down to taking the 10mg  for 2 days.  Dr. Lake Bells sent in a script for prednisone 20mg  x5 days.   Dr. Lake Bells, how would you like her to proceed with the prednisone?

## 2018-09-07 NOTE — Addendum Note (Signed)
Addended by: Len Blalock on: 09/07/2018 11:50 AM   Modules accepted: Orders

## 2018-09-07 NOTE — Progress Notes (Signed)
Subjective:   PATIENT ID: [name] GENDER: female DOB: 09/23/1923, MRN: [id number]  Synopsis:Former patient of Dr. Ashok Cordia with asthma who has dementia.  She lives in [place].   Stopped Xolair 04/2018, started Dupixent 04/2018   HPI  Chief Complaint  Patient presents with  . Follow-up    pt c/o increased wheezing, nonprod cough X1 week.  pt doing well on Dupixent.     And returns to clinic today with a chief complaint of wheezing.  She has been doing well up until the last week.  She says that she has not had fevers, chills, headaches, body aches.  She does not feel more short of breath than normal.  She continues to take her injections of Dupixent on a regular basis.  She continues to take her inhaled medicines.  She is accompanied by her nursing assistant today.  Past Medical History:  Diagnosis Date  . Arthritis   . Asthma   . Depression   . Environmental allergies    mold  . Fall   . Hyperlipidemia   . Hypertension   . Memory loss   . Seasonal allergies      Review of Systems  Constitutional: Negative for chills, diaphoresis and malaise/fatigue.  HENT: Negative for sinus pain and sore throat.   Respiratory: Positive for cough and wheezing. Negative for sputum production and stridor.   Cardiovascular: Negative for chest pain, claudication and leg swelling.      Objective:  Physical Exam   Vitals:   09/07/18 1125  BP: 132/68  Pulse: 65  SpO2: 95%   RA  Gen: no distress in wheelchair HENT: OP clear,  neck supple PULM: Loud upper airway and lower airway wheezing, normal percussion CV: RRR, no mgr, trace edema GI: BS+, soft, nontender Derm: no cyanosis or rash Psyche: normal mood and affect       CBC    Component Value Date/Time   WBC 7.2 02/02/2018 1031   RBC 3.88 02/02/2018 1031   HGB 12.0 02/02/2018 1031   HCT 34.9 (L) 02/02/2018 1031   PLT 272.0 02/02/2018 1031   MCV 89.8 02/02/2018 1031   MCH 30.3 07/24/2017 1822   MCHC 34.4  02/02/2018 1031   RDW 15.3 02/02/2018 1031   LYMPHSABS 0.9 02/02/2018 1031   MONOABS 0.5 02/02/2018 1031   EOSABS 0.7 02/02/2018 1031   BASOSABS 0.0 02/02/2018 1031     PFT 03/29/06: FVC 2.20 L (93%) FEV1 1.45 L (91%) FEV1/FVC 0.66 FEF 25-75 0.74 L (40%) negative bronchodilator response TLC 3.80 L (88%) RV 84% ERV 42% DLCO uncorrected 83%  IMAGING CXR PA/LAT 04/26/17:  No opacity or mass appreciated. No pleural effusion. Heart normal in size & mediastinum normal in contour.  LABS 10/10/8 IgE:  490.7       Assessment & Plan:   Severe persistent asthma with exacerbation  Vocal cord dysfunction  Gastroesophageal reflux disease, esophagitis presence not specified  Discussion: Jariah is wheezing today, predominantly from an upper airway cause but it does seem as if there is limited airflow obstruction and she has some lower airway wheezing.  So she has an exacerbation of her severe persistent asthma but I also suspect some degree of upper airway wheezing.   She does not have flulike symptoms.  Acute asthma exacerbation: We will check a test today called and exhaled nitric oxide test Take prednisone 20 mg daily x5 days Keep using Dupixent as arranged by your assisted living facility Continue Perforomist twice a  day Continue budesonide twice a day Keep using albuterol as needed for chest tightness wheezing or shortness of breath  Gastroesophageal reflux disease: Keep taking omeprazole twice a day  Follow-up with [place] in 4 to 6 weeks or sooner if needed    Current Outpatient Medications:  .  acetaminophen (TYLENOL) 325 MG tablet, Take 650 mg by mouth every 4 (four) hours as needed (pain)., Disp: , Rfl:  .  albuterol (PROVENTIL HFA;VENTOLIN HFA) 108 (90 Base) MCG/ACT inhaler, Inhale 1-2 puffs into the lungs every 6 (six) hours as needed for wheezing or shortness of breath., Disp: 1 Inhaler, Rfl: 6 .  albuterol (PROVENTIL) (2.5 MG/3ML) 0.083% nebulizer solution, Take 3 mLs (2.5  mg total) by nebulization every 4 (four) hours as needed for wheezing or shortness of breath., Disp: 120 mL, Rfl: 3 .  budesonide (PULMICORT) 0.5 MG/2ML nebulizer solution, Take 2 mLs (0.5 mg total) by nebulization 2 (two) times daily., Disp: 60 mL, Rfl: 6 .  Calcium Carb-Cholecalciferol (CALCIUM 600-D PO), Take 1 tablet by mouth daily., Disp: , Rfl:  .  cetirizine (ZYRTEC) 10 MG tablet, Take 10 mg by mouth at bedtime., Disp: , Rfl:  .  Cholecalciferol (VITAMIN D) 2000 units tablet, Take 2,000 Units by mouth at bedtime., Disp: , Rfl:  .  dextromethorphan (DELSYM) 30 MG/5ML liquid, Take 60 mg by mouth every 12 (twelve) hours as needed for cough., Disp: , Rfl:  .  dextromethorphan-guaiFENesin (MUCINEX DM) 30-600 MG 12hr tablet, Take 1 tablet by mouth 2 (two) times daily as needed for cough (congestion). , Disp: , Rfl:  .  diphenhydramine-acetaminophen (TYLENOL PM) 25-500 MG TABS tablet, Take 1 tablet by mouth at bedtime. , Disp: , Rfl:  .  docusate sodium (COLACE) 100 MG capsule, Take 100 mg by mouth at bedtime. , Disp: , Rfl:  .  donepezil (ARICEPT) 5 MG tablet, Take 0.5 tablets (2.5 mg total) by mouth at bedtime., Disp: 30 tablet, Rfl: 8 .  DUPIXENT 300 MG/2ML SOSY, Inject 1 pen/syringe subcutaneously every other week STARTING ON DAY 15, Disp: 4 mL, Rfl: 6 .  FLUoxetine (PROZAC) 20 MG capsule, Take 40 mg by mouth daily. , Disp: , Rfl:  .  fluticasone (FLONASE) 50 MCG/ACT nasal spray, instill 2 sprays into each nostril once daily (Patient taking differently: Place 2 sprays into both nostrils daily. ), Disp: 16 g, Rfl: 6 .  formoterol (PERFOROMIST) 20 MCG/2ML nebulizer solution, Take 2 mLs (20 mcg total) by nebulization 2 (two) times daily., Disp: 60 mL, Rfl: 6 .  furosemide (LASIX) 20 MG tablet, Take 40 mg by mouth daily. , Disp: , Rfl:  .  Heat Wraps (THERMACARE BACK/HIP) MISC, Place 1 patch onto the skin as needed. Not to be worn more than 8 hours, Disp: , Rfl:  .  ipratropium-albuterol (DUONEB)  0.5-2.5 (3) MG/3ML SOLN, Take 3 mLs by nebulization every 4 (four) hours as needed (shortness of breath/wheezing)., Disp: , Rfl:  .  Liniments (SALONPAS PAIN RELIEF PATCH EX), Place 1 patch onto the skin daily as needed (neck pain (remove patch after 12 hours))., Disp: , Rfl:  .  memantine (NAMENDA) 10 MG tablet, Take 1 tablet (10 mg total) by mouth 2 (two) times daily., Disp: 60 tablet, Rfl: 11 .  montelukast (SINGULAIR) 10 MG tablet, Take 1 tablet (10 mg total) by mouth daily. (Patient taking differently: Take 10 mg by mouth at bedtime. ), Disp: 30 tablet, Rfl: 6 .  olmesartan (BENICAR) 40 MG tablet, Take 40 mg by mouth  daily., Disp: , Rfl:  .  omeprazole (PRILOSEC) 20 MG capsule, Take 20 mg by mouth daily before breakfast., Disp: , Rfl:  .  polyethylene glycol (MIRALAX / GLYCOLAX) packet, Take 17 g by mouth daily., Disp: , Rfl:  .  spiritus frumenti (ETHYL ALCOHOL) SOLN, Take 1 each by mouth See admin instructions. May have 1-2 glasses of wine every evening(4 oz each) for a total of 8 oz daily, Disp: , Rfl:  .  vitamin B-12 (CYANOCOBALAMIN) 1000 MCG tablet, Take 3,000 mcg by mouth daily., Disp: , Rfl:   Current Facility-Administered Medications:  .  omalizumab Arvid Right) injection 300 mg, 300 mg, Subcutaneous, Q14 Days, McQuaid, Douglas B, MD, 300 mg at 04/21/18 1343

## 2018-09-07 NOTE — Addendum Note (Signed)
Addended by: Len Blalock on: 09/07/2018 11:41 AM   Modules accepted: Orders

## 2018-09-20 ENCOUNTER — Encounter: Payer: Self-pay | Admitting: Neurology

## 2018-09-20 ENCOUNTER — Ambulatory Visit (INDEPENDENT_AMBULATORY_CARE_PROVIDER_SITE_OTHER): Payer: Medicare Other | Admitting: Neurology

## 2018-09-20 VITALS — BP 99/59 | HR 80 | Ht 63.0 in

## 2018-09-20 DIAGNOSIS — F0280 Dementia in other diseases classified elsewhere without behavioral disturbance: Secondary | ICD-10-CM | POA: Diagnosis not present

## 2018-09-20 DIAGNOSIS — G301 Alzheimer's disease with late onset: Secondary | ICD-10-CM | POA: Diagnosis not present

## 2018-09-20 NOTE — Patient Instructions (Signed)
Dementia  Dementia is the loss of two or more brain functions, such as:  · Memory.  · Decision making.  · Behavior.  · Speaking.  · Thinking.  · Problem solving.  There are many types of dementia. The most common type is called progressive dementia. Progressive dementia gets worse with time and it is irreversible. An example of this type of dementia is Alzheimer disease.  What are the causes?  This condition may be caused by:  · Nerve cell damage in the brain.  · Genetic mutations.  · Certain medicines.  · Multiple small strokes.  · An infection, such as chronic meningitis.  · A metabolic problem, such as vitamin B12 deficiency or thyroid disease.  · Pressure on the brain, such as from a tumor or blood clot.  What are the signs or symptoms?  Symptoms of this condition include:  · Sudden changes in mood.  · Depression.  · Problems with balance.  · Changes in personality.  · Poor short-term memory.  · Agitation.  · Delusions.  · Hallucinations.  · Having a hard time:  ? Speaking thoughts.  ? Finding words.  ? Solving problems.  ? Doing familiar tasks.  ? Understanding familiar ideas.  How is this diagnosed?  This condition is diagnosed with an assessment by your health care provider. During this assessment, your health care provider will talk with you and your family, friends, or caregivers about your symptoms.  A thorough medical history will be taken, and you will have a physical exam and tests. Tests may include:  · Lab tests, such as blood or urine tests.  · Imaging tests, such as a CT scan, PET scan, or MRI.  · A lumbar puncture. This test involves removing and testing a small amount of the fluid that surrounds the brain and spinal cord.  · An electroencephalogram (EEG). In this test, small metal discs are used to measure electrical activity in the brain.  · Memory tests, cognitive tests, and neuropsychological tests. These tests evaluate brain function.  How is this treated?  Treatment depends on the cause of  the dementia. It may involve taking medicines that may help:  · To control the dementia.  · To slow down the disease.  · To manage symptoms.  In some cases, treating the cause of the dementia can improve symptoms, reverse symptoms, or slow down how quickly the dementia gets worse.  Your health care provider can help direct you to support groups, organizations, and other health care providers who can help with decisions about your care.  Follow these instructions at home:  Medicine  · Take over-the-counter and prescription medicines only as told by your health care provider.  · Avoid taking medicines that can affect thinking, such as pain or sleeping medicines.  Lifestyle    · Make healthy lifestyle choices:  ? Be physically active as told by your health care provider.  ? Do not use any tobacco products, such as cigarettes, chewing tobacco, and e-cigarettes. If you need help quitting, ask your health care provider.  ? Eat a healthy diet.  ? Practice stress-management techniques when you get stressed.  ? Stay social.  · Drink enough fluid to keep your urine clear or pale yellow.  · Make sure to get quality sleep. These tips can help you to get a good night's rest:  ? Avoid napping during the day.  ? Keep your sleeping area dark and cool.  ? Avoid exercising during the few   hours before you go to bed.  ? Avoid caffeine products in the evening.  General instructions  · Work with your health care provider to determine what you need help with and what your safety needs are.  · If you were given a bracelet that tracks your location, make sure to wear it.  · Keep all follow-up visits as told by your health care provider. This is important.  Contact a health care provider if:  · You have any new symptoms.  · You have problems with choking or swallowing.  · You have any symptoms of a different illness.  Get help right away if:  · You develop a fever.  · You have new or worsening confusion.  · You have new or worsening  sleepiness.  · You have a hard time staying awake.  · You or your family members become concerned for your safety.  This information is not intended to replace advice given to you by your health care provider. Make sure you discuss any questions you have with your health care provider.  Document Released: 01/13/2001 Document Revised: 11/28/2015 Document Reviewed: 04/17/2015  [employer] Interactive Patient Education © 2019 [employer]

## 2018-09-20 NOTE — Progress Notes (Signed)
[facility]    Provider:  Dr [name] Referring Provider: [name], MD Primary Care Physician:  [name], MD  CC: Dementia  Interval history September 20, 2018: progressive memory changes. No significant  frustration or irritability. She can get annoyed. She forgets her husband is in [place]. Overal she is pretty laid back. She tries to read the paper. She rests in the afternoons. She has someone to come take her out a few hours a day. Daughter provides. No hallucinations or delusions. No behavior problems. She is in [place] which is very nice. No swallowing problems. No behavior problems. Memory is slowly declining. Daughter is here and provides most information.  Interval history 09/14/2017: She has alarms on her bed and chair and wheelchair and a call button. She is in skilled nursing. She was moved from memory unit because she was not a flight risk. It is higher level than independent living. She also has a personal aid from 9-1 every day. Patient thinks things are "alright" daughter says she is confused more about things. Memory is the same. She can converse. She is wheelchair bound. She is worse at night a little more confused about where she is at [name] Daughter calls her every day. She likes to go back to her room after she eats and she likes to get in bed. She lays there for several hours just thinking and when daughter calls she is somewhere else. No behavioral issues. No hallucinations or delusions. Sometimes she has dinner with her dad.   CT 07/24/2017: personally reviewed images and agree with the following   Brain: The ventricles, cisterns and other CSF spaces are within normal as there is mild age related atrophic change. There is chronic ischemic microvascular disease. There is no mass, mass effect, shift of midline structures or acute hemorrhage. There is no evidence of acute infarction. Stable prominent CSF space over the periphery of the  left cerebellar hemisphere.  Vascular: No hyperdense vessel or unexpected calcification.  Skull: Normal. Negative for fracture or focal lesion.  Sinuses/Orbits: Orbits are normal and symmetric. There is complete opacification over the right frontal sinus which is worse. Minimal opacification over the ethmoid air cells and mild mucosal membrane thickening of the right maxillary sinus. Minimal mucosal membrane thickening of the sphenoid sinus. Mastoid air cells are clear.  Other: Minimal soft tissue swelling over the left frontal scalp.  CT CERVICAL SPINE FINDINGS  Alignment: Subtle anterior subluxation of C4 on C5 unchanged compatible degenerate subluxation.  Skull base and vertebrae: Vertebral body heights are maintained. There is mild to moderate spondylosis throughout the cervical spine. Atlantoaxial articulation is within normal. There is uncovertebral joint spurring and facet arthropathy. Bilateral neural foramina narrowing at multiple levels due to adjacent bony spurring. There is a displaced fracture involving the posterior and anterior aspect of the right foramen transversarium of C1. There is slight widening, irregularity and sclerosis of the joint space between the right lateral masses of C1 and C2 suggesting this may be chronic injury. No other fractures identified.  Soft tissues and spinal canal: Prevertebral soft tissues are within normal. Spinal canal is within normal.  Disc levels: Moderate disc space narrowing at the C5-6 and C6-7 levels.  Upper chest: Within normal.  Other: None.  IMPRESSION: No acute brain injury. Minimal soft tissue swelling left frontal scalp.  Displaced fracture involving the anterior and posterior aspect of the right foramen transversarium of C1. This fracture is likely chronic in nature, although an acute component  is not entirely excluded.  Age related atrophy and chronic ischemic microvascular  disease.  Moderate chronic sinus inflammatory disease as described.  Moderate spondylosis of the cervical spine with moderate disc disease at the C5-6 and C6-7 levels. Multilevel bilateral neural foraminal narrowing as described.  These results were called by telephone at the time of interpretation on 07/24/2017 at 7:33 pm to Dr. Marda Stalker , who verbally acknowledged these results.   Interval history 09/08/2016: Here for follow up of dementia She is on namenda. Did not tolerate Anti-cholinesterase meds.Will continue namenda. Her right knee is giving out causing falls but she is in assisted living now with in-home care. Will increase Namenda to 10mg  twice daily. She is in assisted living. Here with her daughter. She gets confused and forgets more. Here with her daughter Steffey. She gets a little more confused. She still asks if she has another apartment she is going back to, sometimes she thinks things are going on that are not such as she said they rolled her into an empty house and remembering things that did not happen. She is not remembering as well as she did. She is administered medicine by caretakers. Sometimes she turns the wrong way to go to the dining hall and gets confused. She did say that her husband visited her one night but at the time she had a virus. Otherwise no hallucinations or delusions or behavioral issues. Prozac is working well. She has been in assisted living since last fall. She is having shots in the knees. She is in PT.   Interval history 03/04/2016: Memory is worsening. She did not tolerate Aricept or Exelon patch due to GI effects, made her sick. Her B12 and TSH were normal. Had a long discussion about dementia, this is a progressive memory disorder. Other medications we can try include Namenda, will start today, discussed side effects. Daughter here and provides most information.   Reviewed images and findings with patient and daughter:C/w dementia.  Clinically appears to be Alzheimer's,  may be a vascular component as well  This MRI of the brain without contrast shows the following: 1. Generalized cortical atrophy that has progressed since 2012. The atrophy is most pronounced in the mesial temporal lobes. 2. Widespread T2/FLAIR hyperintense foci in both hemispheres, pons and cerebellum consistent with chronic microvascular ischemic changes. Many of these changes in the hemispheres and the cerebellum were present in 2012. 3. Although the signal changes in the pons most likely represent chronic microvascular ischemic foci, superimposed metabolic issues cannot be ruled out. 4. Chronic inflammation of the right frontal, right ethmoid cells, right hemi-sphenoid and right maxillary sinusitis with superimposed acute right maxillary sinusitis.  MWN:UUVO M Hornadayis a very pleasant 85 y.o.femalehere as a referral from Dr. Lenor Derrick memory loss. PMHx of Asthma, gerd, depression, cataracts. Started when she had a hip replacement after anesthesia 3-4 years ago. That's when daughter, who provides most information, first noticed it. It was mild at the time, this past fall started progressively worsening. The last 2 months it has gotten a lot worse. She gets confused more. She had a physical therapy appointment and she was confused when she had a new therapist she didn't know. She gets confused about appointments or whether daughter is working or not. Patient gets confused about where daughter is even though it is a work day and she should know where daughter is(at work). She lives in [place] alone in an apartment. She misses medications occasionally, daughter manages the finances, she does not  drive because she felt her reaction time was less, she misplaces things and can't find them occasionally, patient is independent with most ADLs and IADLs. She eats in the [employer]. She has depression and it well controlled, daughter  doesn't notice mood problems, she is not as outgoing as she was. No hallucinations or delusions. She is more fatigued.   Reviewed notes, labs and imaging from outside physicians, which showed:  Ct of the head 01/2011: personally reviewed images and agree with the following:  CT HEAD WITHOUT CONTRAST  Soft tissue windows demonstrate expected cerebral atrophy. Mild low density in the periventricular white matter likely related to small vessel disease. Slightly asymmetric, greater left than right. No mass lesion, hemorrhage, hydrocephalus, acute infarct, intra-axial, or extra-axial fluid collection.  IMPRESSION:personally reviewed and agree with the following:  1. No acute intracranial abnormality. 2. Cerebral atrophy and small vessel ischemic change. 3. Soft tissue swelling about the left supraorbital region.  Review of Systems: Patient complains of symptoms per HPI as well as the following symptoms: cough, wheezing, easy bruising, hering loss, incontinence, memory loss, confusion, decreased energy. Pertinent negatives per HPI. All others negative.  Social History   Socioeconomic History  . Marital status: Married    Spouse name: Not on file  . Number of children: 1  . Years of education: 12  . Highest education level: Not on file  Occupational History  . Occupation: retired    Fish farm manager: RETIRED  Social Needs  . Financial resource strain: Not on file  . Food insecurity:    Worry: Not on file    Inability: Not on file  . Transportation needs:    Medical: Not on file    Non-medical: Not on file  Tobacco Use  . Smoking status: Never Smoker  . Smokeless tobacco: Never Used  Substance and Sexual Activity  . Alcohol use: Yes    Alcohol/week: 0.0 standard drinks    Comment: 1 glass daily  . Drug use: No  . Sexual activity: Not on file  Lifestyle  . Physical activity:    Days per week: Not on file    Minutes per session: Not on file  . Stress: Not on file   Relationships  . Social connections:    Talks on phone: Not on file    Gets together: Not on file    Attends religious service: Not on file    Active member of club or organization: Not on file    Attends meetings of clubs or organizations: Not on file    Relationship status: Not on file  . Intimate partner violence:    Fear of current or ex partner: Not on file    Emotionally abused: Not on file    Physically abused: Not on file    Forced sexual activity: Not on file  Other Topics Concern  . Not on file  Social History Narrative   Originally from [place]. She previously lived in [place]. 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 No bird exposure. No recent mold exposure. Previous hold did have mold.       Lives at [facility] retirement community: skilled nursing area    Caffeine use: 1 cup coffee /day    Family History  Problem Relation Age of Onset  . Asthma Other   . Colon cancer Father   . Dementia Neg Hx     Past Medical History:  Diagnosis Date  . Age-related osteoporosis without current pathological fracture   . Alzheimer's disease (  [place])    with late onset  . Arthritis   . Asthma   . Deficiency of other specified B group vitamins   . Depression   . Environmental allergies    mold  . Fall   . GERD without esophagitis   . Hyperlipidemia   . Hypertension   . Hypertensive kidney disease with CKD (chronic kidney disease)    with stage I through stage 4 CKD   . Memory loss   . Seasonal allergies   . Unspecified osteoarthritis, unspecified site     Past Surgical History:  Procedure Laterality Date  . HIP ARTHROPLASTY  06/25/2011   Procedure: ARTHROPLASTY BIPOLAR HIP;  Surgeon: Laurice Record [place];  Location: [facility];  Service: Orthopedics;  Laterality: Right;  . NASAL SINUS SURGERY    . VAGINAL HYSTERECTOMY      Current Outpatient Medications  Medication Sig Dispense Refill  . acetaminophen (TYLENOL) 325 MG tablet Take 650 mg by mouth every 4 (four) hours as  needed.    Marland Kitchen acetaminophen (TYLENOL) 500 MG tablet Take 500 mg by mouth at bedtime.    Marland Kitchen albuterol (PROVENTIL HFA;VENTOLIN HFA) 108 (90 Base) MCG/ACT inhaler Inhale 1-2 puffs into the lungs every 6 (six) hours as needed for wheezing or shortness of breath. 1 Inhaler 6  . albuterol (PROVENTIL) (2.5 MG/3ML) 0.083% nebulizer solution Take 3 mLs (2.5 mg total) by nebulization every 4 (four) hours as needed for wheezing or shortness of breath. 120 mL 3  . budesonide (PULMICORT) 0.5 MG/2ML nebulizer solution Take 2 mLs (0.5 mg total) by nebulization 2 (two) times daily. 60 mL 6  . Calcium Carb-Cholecalciferol (CALCIUM 600-D PO) Take 1 tablet by mouth daily.    . cetirizine (ZYRTEC) 10 MG tablet Take 10 mg by mouth at bedtime.    . Cholecalciferol (VITAMIN D) 2000 units tablet Take 2,000 Units by mouth at bedtime.    Marland Kitchen dextromethorphan (DELSYM) 30 MG/5ML liquid Take 60 mg by mouth every 12 (twelve) hours as needed for cough.    . dextromethorphan-guaiFENesin (MUCINEX DM) 30-600 MG 12hr tablet Take 1 tablet by mouth 2 (two) times daily as needed for cough (congestion).     Marland Kitchen docusate sodium (COLACE) 100 MG capsule Take 100 mg by mouth at bedtime.     . DUPIXENT 300 MG/2ML SOSY Inject 1 pen/syringe subcutaneously every other week STARTING ON DAY 15 4 mL 6  . FLUoxetine (PROZAC) 20 MG capsule Take 40 mg by mouth daily.     . fluticasone (FLONASE) 50 MCG/ACT nasal spray instill 2 sprays into each nostril once daily (Patient taking differently: Place 2 sprays into both nostrils daily. ) 16 g 6  . formoterol (PERFOROMIST) 20 MCG/2ML nebulizer solution Take 2 mLs (20 mcg total) by nebulization 2 (two) times daily. 60 mL 6  . furosemide (LASIX) 20 MG tablet Take 40 mg by mouth daily.     Marland Kitchen ipratropium-albuterol (DUONEB) 0.5-2.5 (3) MG/3ML SOLN Take 3 mLs by nebulization every 4 (four) hours as needed (shortness of breath/wheezing).    . memantine (NAMENDA) 10 MG tablet Take 1 tablet (10 mg total) by mouth 2 (two)  times daily. 60 tablet 11  . montelukast (SINGULAIR) 10 MG tablet Take 1 tablet (10 mg total) by mouth daily. (Patient taking differently: Take 10 mg by mouth at bedtime. ) 30 tablet 6  . olmesartan (BENICAR) 40 MG tablet Take 40 mg by mouth daily.    Marland Kitchen omeprazole (PRILOSEC) 20 MG capsule Take 20 mg by  mouth 2 (two) times daily.     . ondansetron (ZOFRAN) 4 MG tablet Take 4 mg by mouth every 6 (six) hours as needed for nausea or vomiting.    . polyethylene glycol (MIRALAX / GLYCOLAX) packet Take 17 g by mouth daily.    Marland Kitchen spiritus frumenti (ETHYL ALCOHOL) SOLN Take 1 each by mouth See admin instructions. May have 1-2 glasses of wine every evening(4 oz each) for a total of 8 oz daily    . triamcinolone cream (KENALOG) 0.1 % Apply 1 application topically as needed.    . vitamin B-12 (CYANOCOBALAMIN) 1000 MCG tablet Take 3,000 mcg by mouth daily.    . diphenhydramine-acetaminophen (TYLENOL PM) 25-500 MG TABS tablet Take 1 tablet by mouth at bedtime.     . donepezil (ARICEPT) 5 MG tablet Take 0.5 tablets (2.5 mg total) by mouth at bedtime. (Patient not taking: Reported on 09/20/2018) 30 tablet 8  . Heat Wraps (THERMACARE BACK/HIP) MISC Place 1 patch onto the skin as needed. Not to be worn more than 8 hours    . Liniments (SALONPAS PAIN RELIEF PATCH EX) Place 1 patch onto the skin daily as needed (neck pain (remove patch after 12 hours)).    . predniSONE (DELTASONE) 20 MG tablet Take 1 tablet (20 mg total) by mouth daily with breakfast. 5 tablet 0  . UNABLE TO FIND Med Name: Xolair 150 mg every 2 weeks subcutaneous     Current Facility-Administered Medications  Medication Dose Route Frequency Provider Last Rate Last Dose  . omalizumab Arvid Right) injection 300 mg  300 mg Subcutaneous Q14 Days Simonne Maffucci B, MD   300 mg at 04/21/18 1343    Allergies as of 09/20/2018 - Review Complete 09/07/2018  Allergen Reaction Noted  . Azithromycin Other (See Comments) 04/17/2015  . Aspirin Other (See Comments)  06/25/2011  . Sulfonamide derivatives Other (See Comments) 05/13/2007    Vitals: BP (!) 99/59 (BP Location: Right Arm, Patient Position: Sitting)   Pulse 80   Ht 5\' 3"  (1.6 m)   BMI 27.94 kg/m  Last Weight:  Wt Readings from Last 1 Encounters:  02/10/18 157 lb 11.2 oz (71.5 kg)   Last Height:   Ht Readings from Last 1 Encounters:  09/20/18 5\' 3"  (1.6 m)   Montreal Cognitive Assessment  09/08/2016 03/04/2016 09/04/2015  Visuospatial/ Executive (0/5) 4 1 1   Naming (0/3) 3 3 2   Attention: Read list of digits (0/2) 2 2 2   Attention: Read list of letters (0/1) 1 1 1   Attention: Serial 7 subtraction starting at 100 (0/3) 1 1 3   Language: Repeat phrase (0/2) 2 2 2   Language : Fluency (0/1) 1 1 1   Abstraction (0/2) 2 1 1   Delayed Recall (0/5) 0 0 0  Orientation (0/6) 3 4 4   Total 19 16 17   Adjusted Score (based on education) 19 16 18    MMSE - Mini Mental State Exam 09/20/2018 09/14/2017  Orientation to time 1 0  Orientation to Place 5 5  Registration 3 3  Attention/ Calculation 0 5  Recall 0 2  Language- name 2 objects 2 2  Language- repeat 1 1  Language- follow 3 step command 3 3  Language- read & follow direction 1 1  Write a sentence 1 1  Copy design 0 0  Total score 17 23    Cranial Nerves:    The pupils are equal, round, and reactive to light.  Visual fields are full to threat bilat. Extraocular movements are intact.  Trigeminal sensation is intact and the muscles of mastication are normal. The face is symmetric. The palate elevates in the midline. Hearing ipaired. Voice is normal. Shoulder shrug is normal. The tongue has normal motion without fasciculations.   Coordination:    No dysmetria  Gait:    cannot ambulate independently in a wheelchair  Motor Observation:    No asymmetry, no atrophy, and no involuntary movements noted. Tone:    Normal muscle tone.    Posture:    Posture is normal in wheelchair    Strength:    Strength is equal and antigravity and  symmetric in the upper and lower limbs.      Sensation: intact to LT     Reflex Exam:  DTR's:    Absent AJs      Assessment/Plan:very pleasant 85 y.o. female here as a follow up from Dr. Osborne Casco for dementia with her daughter. PMHx of Asthma, gerd, depression, cataracts. Started when she had a hip replacement after anesthesia around 2013. MMSE declined today to 19/30 today with worsening of memory per daughter c/w moderate to advanced cognitive impairment and dementia. She is [facility] new nursing facility  As far as your medications are concerned, I would like to suggest: Did not tolerate Anti-cholinesterase meds.Will continue namenda   Exercise: Discussed doing yoga, water aerobics. She used to particiapte, I encouraged starting again at [name] Stay social with activities at [facility]. No behavior problems and she is generally laid back and happy.  Diagnostic testing: MRI of the brain and labs: reviewed with patient and daughter at last appointment showing atrophy more pronounced in the mesial temporal lobes c/w alzheimer's dementia, showed them the images. Reviewed new CT images with them, stable.   Recommend calcium and vitamin D.   Addendum: Received notes from primary care, labs completed 02/26/2016. CMP with creatinine of 1, otherwise normal, CBC unremarkable with some mild anemia, vitamin D normal 40 6., B12 1928  Sarina Ill, MD  [facility] [address] [place], [place] [address]  Phone [phone] Fax [phone]  A total of 30 minutes was spent face-to-face with this patient. Over half this time was spent on counseling patient on the  1. Late onset Alzheimer's disease without behavioral disturbance ([place])     diagnosis and different diagnostic and therapeutic options available.

## 2018-10-11 ENCOUNTER — Ambulatory Visit (INDEPENDENT_AMBULATORY_CARE_PROVIDER_SITE_OTHER): Payer: Medicare Other | Admitting: Pulmonary Disease

## 2018-10-11 ENCOUNTER — Encounter: Payer: Self-pay | Admitting: Pulmonary Disease

## 2018-10-11 VITALS — BP 112/74 | HR 73 | Wt 151.0 lb

## 2018-10-11 DIAGNOSIS — J383 Other diseases of vocal cords: Secondary | ICD-10-CM | POA: Diagnosis not present

## 2018-10-11 DIAGNOSIS — K219 Gastro-esophageal reflux disease without esophagitis: Secondary | ICD-10-CM

## 2018-10-11 DIAGNOSIS — J4551 Severe persistent asthma with (acute) exacerbation: Secondary | ICD-10-CM

## 2018-10-11 NOTE — Progress Notes (Signed)
Subjective:   PATIENT ID: [name] GENDER: female DOB: 28-Jul-1924, MRN: [id number]  Synopsis:Former patient of Dr. Ashok Cordia with asthma who has dementia.  She lives in [place].   Stopped Xolair 04/2018, started Dupixent 04/2018   HPI  Chief Complaint  Patient presents with  . Follow-up    f/u asthma, doing well    She has been doing well since the last visit.  Last time we had to give her prednisone for a case of wheezing cough and increasing shortness of breath.  She said that that was helpful as it always is.  She says that she has not had problems with shortness of breath in the last few days.  She says that her nebulizer medicines are being administered twice a day as ordered.  She continues to take [place].  Past Medical History:  Diagnosis Date  . Age-related osteoporosis without current pathological fracture   . Alzheimer's disease ([place])    with late onset  . Arthritis   . Asthma   . Deficiency of other specified B group vitamins   . Depression   . Environmental allergies    mold  . Fall   . GERD without esophagitis   . Hyperlipidemia   . Hypertension   . Hypertensive kidney disease with CKD (chronic kidney disease)    with stage I through stage 4 CKD   . Memory loss   . Seasonal allergies   . Unspecified osteoarthritis, unspecified site      Review of Systems  Constitutional: Negative for chills, diaphoresis and malaise/fatigue.  HENT: Negative for sinus pain and sore throat.   Respiratory: Positive for cough and wheezing. Negative for sputum production and stridor.   Cardiovascular: Negative for chest pain, claudication and leg swelling.      Objective:  Physical Exam   Vitals:   10/11/18 1113  BP: 112/74  Pulse: 73  SpO2: 98%  Weight: 151 lb (68.5 kg)   RA  Gen: elderly female well appearing HENT: OP clear, TM's clear, neck supple PULM: CTA B, normal percussion CV: RRR, no mgr, trace edema GI: BS+, soft, nontender Derm: no  cyanosis or rash Psyche: normal mood and affect      CBC    Component Value Date/Time   WBC 7.2 02/02/2018 1031   RBC 3.88 02/02/2018 1031   HGB 12.0 02/02/2018 1031   HCT 34.9 (L) 02/02/2018 1031   PLT 272.0 02/02/2018 1031   MCV 89.8 02/02/2018 1031   MCH 30.3 07/24/2017 1822   MCHC 34.4 02/02/2018 1031   RDW 15.3 02/02/2018 1031   LYMPHSABS 0.9 02/02/2018 1031   MONOABS 0.5 02/02/2018 1031   EOSABS 0.7 02/02/2018 1031   BASOSABS 0.0 02/02/2018 1031     PFT 03/29/06: FVC 2.20 L (93%) FEV1 1.45 L (91%) FEV1/FVC 0.66 FEF 25-75 0.74 L (40%) negative bronchodilator response TLC 3.80 L (88%) RV 84% ERV 42% DLCO uncorrected 83%  IMAGING CXR PA/LAT 04/26/17:  No opacity or mass appreciated. No pleural effusion. Heart normal in size & mediastinum normal in contour.  LABS 10/10/8 IgE:  490.7       Assessment & Plan:   Severe persistent asthma with exacerbation  Vocal cord dysfunction  Gastroesophageal reflux disease, esophagitis presence not specified  Discussion: Despite a recent exacerbation this is been a stable interval for Ms. Gobert.  She will continue taking her medicines as ordered.  Plan: Severe persistent asthma with eosinophilia and recurrent exacerbations: Continue taking Dupixent as you  are doing Continue Pulmicort twice a day Continue Perforomist twice a day Continue albuterol as needed Practice good hand hygiene Stay active  Follow-up with me in 3 months or sooner if needed    Current Outpatient Medications:  .  acetaminophen (TYLENOL) 325 MG tablet, Take 650 mg by mouth every 4 (four) hours as needed., Disp: , Rfl:  .  acetaminophen (TYLENOL) 500 MG tablet, Take 500 mg by mouth at bedtime., Disp: , Rfl:  .  albuterol (PROVENTIL HFA;VENTOLIN HFA) 108 (90 Base) MCG/ACT inhaler, Inhale 1-2 puffs into the lungs every 6 (six) hours as needed for wheezing or shortness of breath., Disp: 1 Inhaler, Rfl: 6 .  albuterol (PROVENTIL) (2.5 MG/3ML) 0.083%  nebulizer solution, Take 3 mLs (2.5 mg total) by nebulization every 4 (four) hours as needed for wheezing or shortness of breath., Disp: 120 mL, Rfl: 3 .  budesonide (PULMICORT) 0.5 MG/2ML nebulizer solution, Take 2 mLs (0.5 mg total) by nebulization 2 (two) times daily., Disp: 60 mL, Rfl: 6 .  Calcium Carb-Cholecalciferol (CALCIUM 600-D PO), Take 1 tablet by mouth daily., Disp: , Rfl:  .  cetirizine (ZYRTEC) 10 MG tablet, Take 10 mg by mouth at bedtime., Disp: , Rfl:  .  Cholecalciferol (VITAMIN D) 2000 units tablet, Take 2,000 Units by mouth at bedtime., Disp: , Rfl:  .  dextromethorphan (DELSYM) 30 MG/5ML liquid, Take 60 mg by mouth every 12 (twelve) hours as needed for cough., Disp: , Rfl:  .  dextromethorphan-guaiFENesin (MUCINEX DM) 30-600 MG 12hr tablet, Take 1 tablet by mouth 2 (two) times daily as needed for cough (congestion). , Disp: , Rfl:  .  diphenhydramine-acetaminophen (TYLENOL PM) 25-500 MG TABS tablet, Take 1 tablet by mouth at bedtime. , Disp: , Rfl:  .  docusate sodium (COLACE) 100 MG capsule, Take 100 mg by mouth at bedtime. , Disp: , Rfl:  .  donepezil (ARICEPT) 5 MG tablet, Take 0.5 tablets (2.5 mg total) by mouth at bedtime. (Patient not taking: Reported on 09/20/2018), Disp: 30 tablet, Rfl: 8 .  DUPIXENT 300 MG/2ML SOSY, Inject 1 pen/syringe subcutaneously every other week STARTING ON DAY 15, Disp: 4 mL, Rfl: 6 .  FLUoxetine (PROZAC) 20 MG capsule, Take 40 mg by mouth daily. , Disp: , Rfl:  .  fluticasone (FLONASE) 50 MCG/ACT nasal spray, instill 2 sprays into each nostril once daily (Patient taking differently: Place 2 sprays into both nostrils daily. ), Disp: 16 g, Rfl: 6 .  formoterol (PERFOROMIST) 20 MCG/2ML nebulizer solution, Take 2 mLs (20 mcg total) by nebulization 2 (two) times daily., Disp: 60 mL, Rfl: 6 .  furosemide (LASIX) 20 MG tablet, Take 40 mg by mouth daily. , Disp: , Rfl:  .  Heat Wraps (THERMACARE BACK/HIP) MISC, Place 1 patch onto the skin as needed. Not to  be worn more than 8 hours, Disp: , Rfl:  .  ipratropium-albuterol (DUONEB) 0.5-2.5 (3) MG/3ML SOLN, Take 3 mLs by nebulization every 4 (four) hours as needed (shortness of breath/wheezing)., Disp: , Rfl:  .  Liniments (SALONPAS PAIN RELIEF PATCH EX), Place 1 patch onto the skin daily as needed (neck pain (remove patch after 12 hours))., Disp: , Rfl:  .  memantine (NAMENDA) 10 MG tablet, Take 1 tablet (10 mg total) by mouth 2 (two) times daily., Disp: 60 tablet, Rfl: 11 .  montelukast (SINGULAIR) 10 MG tablet, Take 1 tablet (10 mg total) by mouth daily. (Patient taking differently: Take 10 mg by mouth at bedtime. ), Disp: 30 tablet,  Rfl: 6 .  olmesartan (BENICAR) 40 MG tablet, Take 40 mg by mouth daily., Disp: , Rfl:  .  omeprazole (PRILOSEC) 20 MG capsule, Take 20 mg by mouth 2 (two) times daily. , Disp: , Rfl:  .  ondansetron (ZOFRAN) 4 MG tablet, Take 4 mg by mouth every 6 (six) hours as needed for nausea or vomiting., Disp: , Rfl:  .  polyethylene glycol (MIRALAX / GLYCOLAX) packet, Take 17 g by mouth daily., Disp: , Rfl:  .  predniSONE (DELTASONE) 20 MG tablet, Take 1 tablet (20 mg total) by mouth daily with breakfast., Disp: 5 tablet, Rfl: 0 .  spiritus frumenti (ETHYL ALCOHOL) SOLN, Take 1 each by mouth See admin instructions. May have 1-2 glasses of wine every evening(4 oz each) for a total of 8 oz daily, Disp: , Rfl:  .  triamcinolone cream (KENALOG) 0.1 %, Apply 1 application topically as needed., Disp: , Rfl:  .  UNABLE TO FIND, Med Name: Xolair 150 mg every 2 weeks subcutaneous, Disp: , Rfl:  .  vitamin B-12 (CYANOCOBALAMIN) 1000 MCG tablet, Take 3,000 mcg by mouth daily., Disp: , Rfl:   Current Facility-Administered Medications:  .  omalizumab Arvid Right) injection 300 mg, 300 mg, Subcutaneous, Q14 Days, ,  B, MD, 300 mg at 04/21/18 1343

## 2018-10-11 NOTE — Patient Instructions (Signed)
Severe persistent asthma with eosinophilia and recurrent exacerbations: Continue taking Dupixent as you are doing Continue Pulmicort twice a day Continue Perforomist twice a day Continue albuterol as needed Practice good hand hygiene Stay active  Follow-up with me in 3 months or sooner if needed

## 2018-10-20 DIAGNOSIS — H90A31 Mixed conductive and sensorineural hearing loss, unilateral, right ear with restricted hearing on the contralateral side: Secondary | ICD-10-CM | POA: Diagnosis not present

## 2018-10-20 DIAGNOSIS — H90A22 Sensorineural hearing loss, unilateral, left ear, with restricted hearing on the contralateral side: Secondary | ICD-10-CM | POA: Diagnosis not present

## 2018-11-24 DIAGNOSIS — M1711 Unilateral primary osteoarthritis, right knee: Secondary | ICD-10-CM | POA: Diagnosis not present

## 2018-12-01 DIAGNOSIS — M1711 Unilateral primary osteoarthritis, right knee: Secondary | ICD-10-CM | POA: Diagnosis not present

## 2018-12-08 DIAGNOSIS — M1711 Unilateral primary osteoarthritis, right knee: Secondary | ICD-10-CM | POA: Diagnosis not present

## 2018-12-14 DIAGNOSIS — J302 Other seasonal allergic rhinitis: Secondary | ICD-10-CM | POA: Diagnosis not present

## 2018-12-14 DIAGNOSIS — Z1331 Encounter for screening for depression: Secondary | ICD-10-CM | POA: Diagnosis not present

## 2018-12-14 DIAGNOSIS — N183 Chronic kidney disease, stage 3 (moderate): Secondary | ICD-10-CM | POA: Diagnosis not present

## 2018-12-14 DIAGNOSIS — M1990 Unspecified osteoarthritis, unspecified site: Secondary | ICD-10-CM | POA: Diagnosis not present

## 2018-12-14 DIAGNOSIS — J4550 Severe persistent asthma, uncomplicated: Secondary | ICD-10-CM | POA: Diagnosis not present

## 2018-12-14 DIAGNOSIS — R413 Other amnesia: Secondary | ICD-10-CM | POA: Diagnosis not present

## 2018-12-14 DIAGNOSIS — E538 Deficiency of other specified B group vitamins: Secondary | ICD-10-CM | POA: Diagnosis not present

## 2018-12-14 DIAGNOSIS — I129 Hypertensive chronic kidney disease with stage 1 through stage 4 chronic kidney disease, or unspecified chronic kidney disease: Secondary | ICD-10-CM | POA: Diagnosis not present

## 2018-12-15 DIAGNOSIS — N183 Chronic kidney disease, stage 3 (moderate): Secondary | ICD-10-CM | POA: Diagnosis not present

## 2018-12-15 DIAGNOSIS — E559 Vitamin D deficiency, unspecified: Secondary | ICD-10-CM | POA: Diagnosis not present

## 2018-12-15 DIAGNOSIS — D649 Anemia, unspecified: Secondary | ICD-10-CM | POA: Diagnosis not present

## 2018-12-15 DIAGNOSIS — R5381 Other malaise: Secondary | ICD-10-CM | POA: Diagnosis not present

## 2018-12-20 DIAGNOSIS — H838X3 Other specified diseases of inner ear, bilateral: Secondary | ICD-10-CM | POA: Diagnosis not present

## 2018-12-20 DIAGNOSIS — H6123 Impacted cerumen, bilateral: Secondary | ICD-10-CM | POA: Diagnosis not present

## 2018-12-20 DIAGNOSIS — H903 Sensorineural hearing loss, bilateral: Secondary | ICD-10-CM | POA: Diagnosis not present

## 2019-01-12 DIAGNOSIS — Z20828 Contact with and (suspected) exposure to other viral communicable diseases: Secondary | ICD-10-CM | POA: Diagnosis not present

## 2019-01-31 ENCOUNTER — Ambulatory Visit (INDEPENDENT_AMBULATORY_CARE_PROVIDER_SITE_OTHER): Payer: Medicare Other | Admitting: Primary Care

## 2019-01-31 ENCOUNTER — Other Ambulatory Visit: Payer: Self-pay

## 2019-01-31 ENCOUNTER — Encounter: Payer: Self-pay | Admitting: Primary Care

## 2019-01-31 DIAGNOSIS — J4550 Severe persistent asthma, uncomplicated: Secondary | ICD-10-CM

## 2019-01-31 NOTE — Patient Instructions (Addendum)
Asthma: - Continue Dupixent 300mg  IM q14 days - Continue Pulmicort and Perforomist twice daily - Albuterol hfa/neb every 6 hours as needed for breakthrough shortness of breath/wheezing    Follow-up 4-6 months with Dr. Lake Bells OR sooner if needed   Asthma, Adult  Asthma is a long-term (chronic) condition in which the airways get tight and narrow. The airways are the breathing passages that lead from the nose and mouth down into the lungs. A person with asthma will have times when symptoms get worse. These are called asthma attacks. They can cause coughing, whistling sounds when you breathe (wheezing), shortness of breath, and chest pain. They can make it hard to breathe. There is no cure for asthma, but medicines and lifestyle changes can help control it. There are many things that can bring on an asthma attack or make asthma symptoms worse (triggers). Common triggers include:  Mold.  Dust.  Cigarette smoke.  Cockroaches.  Things that can cause allergy symptoms (allergens). These include animal skin flakes (dander) and pollen from trees or grass.  Things that pollute the air. These may include household cleaners, wood smoke, smog, or chemical odors.  Cold air, weather changes, and wind.  Crying or laughing hard.  Stress.  Certain medicines or drugs.  Certain foods such as dried fruit, potato chips, and grape juice.  Infections, such as a cold or the flu.  Certain medical conditions or diseases.  Exercise or tiring activities. Asthma may be treated with medicines and by staying away from the things that cause asthma attacks. Types of medicines may include:  Controller medicines. These help prevent asthma symptoms. They are usually taken every day.  Fast-acting reliever or rescue medicines. These quickly relieve asthma symptoms. They are used as needed and provide short-term relief.  Allergy medicines if your attacks are brought on by allergens.  Medicines to help  control the body's defense (immune) system. Follow these instructions at home: Avoiding triggers in your home  Change your heating and air conditioning filter often.  Limit your use of fireplaces and wood stoves.  Get rid of pests (such as roaches and mice) and their droppings.  Throw away plants if you see mold on them.  Clean your floors. Dust regularly. Use cleaning products that do not smell.  Have someone vacuum when you are not home. Use a vacuum cleaner with a HEPA filter if possible.  Replace carpet with wood, tile, or vinyl flooring. Carpet can trap animal skin flakes and dust.  Use allergy-proof pillows, mattress covers, and box spring covers.  Wash bed sheets and blankets every week in hot water. Dry them in a dryer.  Keep your bedroom free of any triggers.  Avoid pets and keep windows closed when things that cause allergy symptoms are in the air.  Use blankets that are made of polyester or cotton.  Clean bathrooms and kitchens with bleach. If possible, have someone repaint the walls in these rooms with mold-resistant paint. Keep out of the rooms that are being cleaned and painted.  Wash your hands often with soap and water. If soap and water are not available, use hand sanitizer.  Do not allow anyone to smoke in your home. General instructions  Take over-the-counter and prescription medicines only as told by your doctor. ? Talk with your doctor if you have questions about how or when to take your medicines. ? Make note if you need to use your medicines more often than usual.  Do not use any products that contain  nicotine or tobacco, such as cigarettes and e-cigarettes. If you need help quitting, ask your doctor.  Stay away from secondhand smoke.  Avoid doing things outdoors when allergen counts are high and when air quality is low.  Wear a ski mask when doing outdoor activities in the winter. The mask should cover your nose and mouth. Exercise indoors on cold  days if you can.  Warm up before you exercise. Take time to cool down after exercise.  Use a peak flow meter as told by your doctor. A peak flow meter is a tool that measures how well the lungs are working.  Keep track of the peak flow meter's readings. Write them down.  Follow your asthma action plan. This is a written plan for taking care of your asthma and treating your attacks.  Make sure you get all the shots (vaccines) that your doctor recommends. Ask your doctor about a flu shot and a pneumonia shot.  Keep all follow-up visits as told by your doctor. This is important. Contact a doctor if:  You have wheezing, shortness of breath, or a cough even while taking medicine to prevent attacks.  The mucus you cough up (sputum) is thicker than usual.  The mucus you cough up changes from clear or white to yellow, green, gray, or bloody.  You have problems from the medicine you are taking, such as: ? A rash. ? Itching. ? Swelling. ? Trouble breathing.  You need reliever medicines more than 2-3 times a week.  Your peak flow reading is still at 50-79% of your personal best after following the action plan for 1 hour.  You have a fever. Get help right away if:  You seem to be worse and are not responding to medicine during an asthma attack.  You are short of breath even at rest.  You get short of breath when doing very little activity.  You have trouble eating, drinking, or talking.  You have chest pain or tightness.  You have a fast heartbeat.  Your lips or fingernails start to turn blue.  You are light-headed or dizzy, or you faint.  Your peak flow is less than 50% of your personal best.  You feel too tired to breathe normally. Summary  Asthma is a long-term (chronic) condition in which the airways get tight and narrow. An asthma attack can make it hard to breathe.  Asthma cannot be cured, but medicines and lifestyle changes can help control it.  Make sure you  understand how to avoid triggers and how and when to use your medicines. This information is not intended to replace advice given to you by your health care provider. Make sure you discuss any questions you have with your health care provider. Document Released: 01/06/2008 Document Revised: 09/22/2018 Document Reviewed: 08/24/2016 Elsevier Patient Education  2020 [employer].

## 2019-01-31 NOTE — Progress Notes (Signed)
  Virtual Visit via Telephone Note  I connected with [name] on 01/31/19 at  1:30 PM EDT by telephone and verified that I am speaking with the correct person using two identifiers.  Location: Patient: Home Provider: Office   I discussed the limitations, risks, security and privacy concerns of performing an evaluation and management service by telephone and the availability of in person appointments. I also discussed with the patient that there may be a patient responsible charge related to this service. The patient expressed understanding and agreed to proceed.  Synopsis: Former patient of Dr. Ashok Cordia with asthma who has dementia. She lives in [place]. Stopped Xolair 04/2018, started Dupixent 04/2018   History of Present Illness: 85 year old female, never smoked. PMH significant for hypertension, severe persistent asthma with eosinophilia, allergic rhinitis, dementia. Patient of Dr. Lake Bells, last seen on 10/11/18. Maintained on Dupixent 300mg  q14 days, pulmicort and perforomist.   01/31/2019 Patient called today for 3 month follow-up visit. She lives in [place]. Accompanied by [facility] during televisit. Misty states that patient is doing really well since starting dupixent. She receives injection from facility every 2 weeks. She has not had any shortness of breath or wheezing. No recent exacerbations. Continues nebulizer's as prescribed, rare SABA use.    Observations/Objective:  - None   Assessment and Plan:  Asthma: - Continue Dupixent 300mg  q14 days - Continue Pulmicort and Perforomist twice daily - Albuterol hfa/neb every 6 hours as needed for breakthrough shortness of breath/wheezing   Follow Up Instructions:  Follow-up 4-6 months with Dr. Lake Bells or sooner if needed  I discussed the assessment and treatment plan with the patient. The patient was provided an opportunity to ask questions and all were answered. The patient agreed with the plan and demonstrated an  understanding of the instructions.   The patient was advised to call back or seek an in-person evaluation if the symptoms worsen or if the condition fails to improve as anticipated.  I provided 10 minutes of non-face-to-face time during this encounter.   Martyn Ehrich, NP

## 2019-01-31 NOTE — Progress Notes (Signed)
Reviewed, agree 

## 2019-03-27 DIAGNOSIS — H43813 Vitreous degeneration, bilateral: Secondary | ICD-10-CM | POA: Diagnosis not present

## 2019-03-27 DIAGNOSIS — H35033 Hypertensive retinopathy, bilateral: Secondary | ICD-10-CM | POA: Diagnosis not present

## 2019-03-27 DIAGNOSIS — H35373 Puckering of macula, bilateral: Secondary | ICD-10-CM | POA: Diagnosis not present

## 2019-03-27 DIAGNOSIS — H35363 Drusen (degenerative) of macula, bilateral: Secondary | ICD-10-CM | POA: Diagnosis not present

## 2019-04-04 DIAGNOSIS — L821 Other seborrheic keratosis: Secondary | ICD-10-CM | POA: Diagnosis not present

## 2019-04-04 DIAGNOSIS — C44722 Squamous cell carcinoma of skin of right lower limb, including hip: Secondary | ICD-10-CM | POA: Diagnosis not present

## 2019-04-04 DIAGNOSIS — D2239 Melanocytic nevi of other parts of face: Secondary | ICD-10-CM | POA: Diagnosis not present

## 2019-04-04 DIAGNOSIS — Z85828 Personal history of other malignant neoplasm of skin: Secondary | ICD-10-CM | POA: Diagnosis not present

## 2019-04-04 DIAGNOSIS — D485 Neoplasm of uncertain behavior of skin: Secondary | ICD-10-CM | POA: Diagnosis not present

## 2019-04-04 DIAGNOSIS — L820 Inflamed seborrheic keratosis: Secondary | ICD-10-CM | POA: Diagnosis not present

## 2019-04-07 DIAGNOSIS — R413 Other amnesia: Secondary | ICD-10-CM | POA: Diagnosis not present

## 2019-04-07 DIAGNOSIS — N183 Chronic kidney disease, stage 3 (moderate): Secondary | ICD-10-CM | POA: Diagnosis not present

## 2019-04-07 DIAGNOSIS — J302 Other seasonal allergic rhinitis: Secondary | ICD-10-CM | POA: Diagnosis not present

## 2019-04-07 DIAGNOSIS — R5381 Other malaise: Secondary | ICD-10-CM | POA: Diagnosis not present

## 2019-04-07 DIAGNOSIS — R159 Full incontinence of feces: Secondary | ICD-10-CM | POA: Diagnosis not present

## 2019-04-07 DIAGNOSIS — J4550 Severe persistent asthma, uncomplicated: Secondary | ICD-10-CM | POA: Diagnosis not present

## 2019-04-07 DIAGNOSIS — R32 Unspecified urinary incontinence: Secondary | ICD-10-CM | POA: Diagnosis not present

## 2019-05-03 DIAGNOSIS — Z9189 Other specified personal risk factors, not elsewhere classified: Secondary | ICD-10-CM | POA: Diagnosis not present

## 2019-05-11 DIAGNOSIS — Z20828 Contact with and (suspected) exposure to other viral communicable diseases: Secondary | ICD-10-CM | POA: Diagnosis not present

## 2019-05-17 DIAGNOSIS — Z9189 Other specified personal risk factors, not elsewhere classified: Secondary | ICD-10-CM | POA: Diagnosis not present

## 2019-05-22 DIAGNOSIS — Z9189 Other specified personal risk factors, not elsewhere classified: Secondary | ICD-10-CM | POA: Diagnosis not present

## 2019-05-25 DIAGNOSIS — R293 Abnormal posture: Secondary | ICD-10-CM | POA: Diagnosis not present

## 2019-05-25 DIAGNOSIS — M6389 Disorders of muscle in diseases classified elsewhere, multiple sites: Secondary | ICD-10-CM | POA: Diagnosis not present

## 2019-05-25 DIAGNOSIS — R531 Weakness: Secondary | ICD-10-CM | POA: Diagnosis not present

## 2019-05-25 DIAGNOSIS — G301 Alzheimer's disease with late onset: Secondary | ICD-10-CM | POA: Diagnosis not present

## 2019-05-29 DIAGNOSIS — Z9189 Other specified personal risk factors, not elsewhere classified: Secondary | ICD-10-CM | POA: Diagnosis not present

## 2019-05-31 DIAGNOSIS — M6389 Disorders of muscle in diseases classified elsewhere, multiple sites: Secondary | ICD-10-CM | POA: Diagnosis not present

## 2019-05-31 DIAGNOSIS — R293 Abnormal posture: Secondary | ICD-10-CM | POA: Diagnosis not present

## 2019-05-31 DIAGNOSIS — G301 Alzheimer's disease with late onset: Secondary | ICD-10-CM | POA: Diagnosis not present

## 2019-05-31 DIAGNOSIS — R531 Weakness: Secondary | ICD-10-CM | POA: Diagnosis not present

## 2019-06-05 DIAGNOSIS — Z9189 Other specified personal risk factors, not elsewhere classified: Secondary | ICD-10-CM | POA: Diagnosis not present

## 2019-06-07 DIAGNOSIS — M6389 Disorders of muscle in diseases classified elsewhere, multiple sites: Secondary | ICD-10-CM | POA: Diagnosis not present

## 2019-06-07 DIAGNOSIS — G301 Alzheimer's disease with late onset: Secondary | ICD-10-CM | POA: Diagnosis not present

## 2019-06-07 DIAGNOSIS — R293 Abnormal posture: Secondary | ICD-10-CM | POA: Diagnosis not present

## 2019-06-07 DIAGNOSIS — R531 Weakness: Secondary | ICD-10-CM | POA: Diagnosis not present

## 2019-06-08 DIAGNOSIS — R531 Weakness: Secondary | ICD-10-CM | POA: Diagnosis not present

## 2019-06-08 DIAGNOSIS — G301 Alzheimer's disease with late onset: Secondary | ICD-10-CM | POA: Diagnosis not present

## 2019-06-08 DIAGNOSIS — R293 Abnormal posture: Secondary | ICD-10-CM | POA: Diagnosis not present

## 2019-06-08 DIAGNOSIS — M6389 Disorders of muscle in diseases classified elsewhere, multiple sites: Secondary | ICD-10-CM | POA: Diagnosis not present

## 2019-06-14 DIAGNOSIS — G301 Alzheimer's disease with late onset: Secondary | ICD-10-CM | POA: Diagnosis not present

## 2019-06-14 DIAGNOSIS — Z20828 Contact with and (suspected) exposure to other viral communicable diseases: Secondary | ICD-10-CM | POA: Diagnosis not present

## 2019-06-14 DIAGNOSIS — R293 Abnormal posture: Secondary | ICD-10-CM | POA: Diagnosis not present

## 2019-06-14 DIAGNOSIS — Z9189 Other specified personal risk factors, not elsewhere classified: Secondary | ICD-10-CM | POA: Diagnosis not present

## 2019-06-14 DIAGNOSIS — M6389 Disorders of muscle in diseases classified elsewhere, multiple sites: Secondary | ICD-10-CM | POA: Diagnosis not present

## 2019-06-14 DIAGNOSIS — R531 Weakness: Secondary | ICD-10-CM | POA: Diagnosis not present

## 2019-06-19 DIAGNOSIS — Z9189 Other specified personal risk factors, not elsewhere classified: Secondary | ICD-10-CM | POA: Diagnosis not present

## 2019-06-20 DIAGNOSIS — H903 Sensorineural hearing loss, bilateral: Secondary | ICD-10-CM | POA: Diagnosis not present

## 2019-06-20 DIAGNOSIS — H838X3 Other specified diseases of inner ear, bilateral: Secondary | ICD-10-CM | POA: Diagnosis not present

## 2019-06-21 DIAGNOSIS — R293 Abnormal posture: Secondary | ICD-10-CM | POA: Diagnosis not present

## 2019-06-21 DIAGNOSIS — G301 Alzheimer's disease with late onset: Secondary | ICD-10-CM | POA: Diagnosis not present

## 2019-06-21 DIAGNOSIS — R531 Weakness: Secondary | ICD-10-CM | POA: Diagnosis not present

## 2019-06-21 DIAGNOSIS — M6389 Disorders of muscle in diseases classified elsewhere, multiple sites: Secondary | ICD-10-CM | POA: Diagnosis not present

## 2019-06-23 ENCOUNTER — Telehealth: Payer: Self-pay | Admitting: Primary Care

## 2019-06-26 ENCOUNTER — Telehealth: Payer: Self-pay | Admitting: Primary Care

## 2019-06-26 NOTE — Telephone Encounter (Signed)
error 

## 2019-06-26 NOTE — Telephone Encounter (Signed)
Daughter calling to speak with someone about getting her mothers Duipxent renewed

## 2019-06-26 NOTE — Telephone Encounter (Signed)
Spoke to pt's daughter Oconnell (dpr on file), states that she received a letter stating that pt's Dupixent PA will expire on 08/03/2019 and needs to be reauthorized. Daughter Caslin would like to be made aware of when this has been approved.   Forwarding to injection pool to follow up on.

## 2019-06-27 DIAGNOSIS — Z9189 Other specified personal risk factors, not elsewhere classified: Secondary | ICD-10-CM | POA: Diagnosis not present

## 2019-07-13 NOTE — Telephone Encounter (Signed)
Patient is former [facility] patient who was last seen 01/31/19 by Eustaquio Maize, NP.  Patient receives Dupixent at [facility] and is due for a new PA.  Dupixent PA will expire 08/03/19.  Beth, are you ok with [place] placing [place] PA for 2021 under you?

## 2019-07-14 NOTE — Telephone Encounter (Signed)
Sure thing!

## 2019-07-14 NOTE — Telephone Encounter (Signed)
Called [facility] to check on need for Dupixent PA.  Spoke with Santiago Glad, who stated Patient has not received Dupixent from them since 2019.  Eddy [phone] to follow up on Dupixent injections.  Spoke with Nurse who is going to check to see if Patient is receiving injections and call back with  pharmacy information.

## 2019-07-14 NOTE — Telephone Encounter (Signed)
Make sure she established with new [facility] pulmonary doctor here if she hasnt been seen

## 2019-07-17 DIAGNOSIS — Z9189 Other specified personal risk factors, not elsewhere classified: Secondary | ICD-10-CM | POA: Diagnosis not present

## 2019-07-17 DIAGNOSIS — Z20828 Contact with and (suspected) exposure to other viral communicable diseases: Secondary | ICD-10-CM | POA: Diagnosis not present

## 2019-07-19 NOTE — Telephone Encounter (Signed)
Did we get an update on this?

## 2019-07-21 NOTE — Telephone Encounter (Signed)
[place] daughter checking the status of the Dupixent PA.  Expires 08/03/2019.  [facility] phone number is [phone] and [phone].

## 2019-07-21 NOTE — Telephone Encounter (Signed)
Unable to speak with anyone at [facility] that knows about Patient receiving Dupixent or PA. Called and spoke with Daughter, Signore.  Tyrell stated [facility] uses [facility] in [place], and the [place] letter she received was from [facility].  Allers stated she could fax PA request to our office.  Fax number given. Will wait on fax and follow up next week.

## 2019-07-24 NOTE — Telephone Encounter (Signed)
Received the fax from the pt's daughter. PA does not expire until 08/03/2019. We will need to call the pt's insurance company at [phone] to initiate a new PA closer to 08/03/2019.

## 2019-08-03 NOTE — Telephone Encounter (Signed)
Called [phone] to initiate new Dupixent PA.  I was told number is not correct.  [employer] was the number given and is not handling or has accounts for Patient. Explained per fax this is the number to contact. I was told they can not help.

## 2019-08-03 NOTE — Telephone Encounter (Signed)
Called number on insurance card [phone] as instructed by [employer] previous number. It was also [employer]. Spoke with Brayton Layman, Dupixent PA initiated and approved.  Fax should be received in next 24 hours. PA# [id number].

## 2019-08-10 DIAGNOSIS — Z20828 Contact with and (suspected) exposure to other viral communicable diseases: Secondary | ICD-10-CM | POA: Diagnosis not present

## 2019-08-10 DIAGNOSIS — Z9189 Other specified personal risk factors, not elsewhere classified: Secondary | ICD-10-CM | POA: Diagnosis not present

## 2019-08-15 ENCOUNTER — Non-Acute Institutional Stay (SKILLED_NURSING_FACILITY): Payer: Medicare Other | Admitting: Internal Medicine

## 2019-08-15 ENCOUNTER — Encounter: Payer: Self-pay | Admitting: Internal Medicine

## 2019-08-15 DIAGNOSIS — J4540 Moderate persistent asthma, uncomplicated: Secondary | ICD-10-CM

## 2019-08-15 DIAGNOSIS — J383 Other diseases of vocal cords: Secondary | ICD-10-CM

## 2019-08-15 DIAGNOSIS — R531 Weakness: Secondary | ICD-10-CM

## 2019-08-15 DIAGNOSIS — M1711 Unilateral primary osteoarthritis, right knee: Secondary | ICD-10-CM | POA: Diagnosis not present

## 2019-08-15 DIAGNOSIS — I10 Essential (primary) hypertension: Secondary | ICD-10-CM | POA: Diagnosis not present

## 2019-08-15 DIAGNOSIS — U071 COVID-19: Secondary | ICD-10-CM

## 2019-08-15 DIAGNOSIS — G301 Alzheimer's disease with late onset: Secondary | ICD-10-CM

## 2019-08-15 DIAGNOSIS — F0280 Dementia in other diseases classified elsewhere without behavioral disturbance: Secondary | ICD-10-CM | POA: Diagnosis not present

## 2019-08-15 NOTE — Progress Notes (Signed)
A user error has taken place: encounter opened in error, closed for administrative reasons.

## 2019-08-15 NOTE — Progress Notes (Signed)
Provider:  Rexene Edison. Mariea Clonts, D.O., C.M.D. Location:  [place] Room Number: 121 Place of Service:  SNF (31)  PCP: Tisovec, Fransico Him, MD Patient Care Team: Tisovec, Fransico Him, MD as PCP - General (Internal Medicine) Elsie Stain, MD (Pulmonary Disease)  Extended Emergency Contact Information Primary Emergency Contact: Earlene Plater of [place] Phone: [phone] Mobile Phone: [phone] Relation: Daughter  Code Status: DNR Goals of Care: Advanced Directive information Advanced Directives 08/15/2019  Does Patient Have a Medical Advance Directive? Yes  Type of Advance Directive [place]  Does patient want to make changes to medical advance directive? No - Patient declined  Copy of [place] in Chart? -  Pre-existing out of facility DNR order (yellow form or pink MOST form) -    Chief Complaint  Patient presents with  . New Admit To SNF    Covid 19    HPI: Patient is a 86 y.o. female with h/o dementia, htn, asthma, allergic rhinitis, OA of her right knee, vocal cord dysfunction, generalized weakness, and high fall risk seen today for admission to [facility] covid unit due to covid-19 positive test--[facility] has been asked to care for her now.  She'd been followed by Dr. Osborne Casco previously at [facility] and was going out for appts to see him.  Currently, this is not feasible amid covid and now she has covid.     08/05/19, she developed a temp of 100.3 and respirations were 22, she was flushed and had a medium-sized loose stool.  She was swabbed on 08/06/19 for covid which was negative, but repeat on 08/10/19 was positive.  She had some ongoing low grade temps through 1/4 and a wet-sounding, but nonproductive cough that was treated with delsym.  She's had fine crackles at her bases.  Intake is not very good, but not remarkably different than baseline per nursing staff.  She herself has no  complaints, was pleasant and grateful for my visit.  She's not required oxygen therapy.  She has not had any further documented loose bms.    When seen, she was awake and alert.  She had some coughing.  She was eating her breakfast.    Past Medical History:  Diagnosis Date  . Age-related osteoporosis without current pathological fracture   . Alzheimer's disease ([place])    with late onset  . Arthritis   . Asthma   . Deficiency of other specified B group vitamins   . Depression   . Environmental allergies    mold  . Fall   . GERD without esophagitis   . Hyperlipidemia   . Hypertension   . Hypertensive kidney disease with CKD (chronic kidney disease)    with stage I through stage 4 CKD   . Memory loss   . Seasonal allergies   . Unspecified osteoarthritis, unspecified site    Past Surgical History:  Procedure Laterality Date  . HIP ARTHROPLASTY  06/25/2011   Procedure: ARTHROPLASTY BIPOLAR HIP;  Surgeon: Laurice Record [place];  Location: [facility];  Service: Orthopedics;  Laterality: Right;  . NASAL SINUS SURGERY    . VAGINAL HYSTERECTOMY      reports that she has never smoked. She has never used smokeless tobacco. She reports current alcohol use. She reports that she does not use drugs. Social History   Socioeconomic History  . Marital status: Married    Spouse name: Not on file  . Number of children: 1  . Years  of education: 16  . Highest education level: Not on file  Occupational History  . Occupation: retired    Fish farm manager: RETIRED  Tobacco Use  . Smoking status: Never Smoker  . Smokeless tobacco: Never Used  Substance and Sexual Activity  . Alcohol use: Yes    Alcohol/week: 0.0 standard drinks    Comment: 1 glass daily  . Drug use: No  . Sexual activity: Not on file  Other Topics Concern  . Not on file  Social History Narrative   Originally from [place]. She previously lived in [place]. 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 No bird exposure. No recent mold exposure.  Previous hold did have mold.       Lives at [facility] community: skilled nursing area    Caffeine use: 1 cup coffee /day   Social Determinants of Health   Financial Resource Strain:   . Difficulty of Paying Living Expenses: Not on file  Food Insecurity:   . Worried About Charity fundraiser in the Last Year: Not on file  . Ran Out of Food in the Last Year: Not on file  Transportation Needs:   . Lack of Transportation (Medical): Not on file  . Lack of Transportation (Non-Medical): Not on file  Physical Activity:   . Days of Exercise per Week: Not on file  . Minutes of Exercise per Session: Not on file  Stress:   . Feeling of Stress : Not on file  Social Connections:   . Frequency of Communication with Friends and Family: Not on file  . Frequency of Social Gatherings with Friends and Family: Not on file  . Attends Religious Services: Not on file  . Active Member of Clubs or Organizations: Not on file  . Attends Archivist Meetings: Not on file  . Marital Status: Not on file  Intimate Partner Violence:   . Fear of Current or Ex-Partner: Not on file  . Emotionally Abused: Not on file  . Physically Abused: Not on file  . Sexually Abused: Not on file    Functional Status Survey:    Family History  Problem Relation Age of Onset  . Asthma Other   . Colon cancer Father   . Dementia Neg Hx     Health Maintenance  Topic Date Due  . TETANUS/TDAP  09/25/1942  . DEXA SCAN  09/25/1990  . INFLUENZA VACCINE  03/04/2019  . PNA vac Low Risk Adult  Completed    Allergies  Allergen Reactions  . Azithromycin Other (See Comments)    Nausea, weakness   . Aspirin Other (See Comments)    Unknown reaction  . Sulfonamide Derivatives Other (See Comments)    Unknown reaction    Outpatient Encounter Medications as of 08/15/2019  Medication Sig  . acetaminophen (TYLENOL) 500 MG tablet Take 500 mg by mouth at bedtime.  Marland Kitchen albuterol (PROVENTIL) (2.5 MG/3ML) 0.083%  nebulizer solution Take 3 mLs (2.5 mg total) by nebulization every 4 (four) hours as needed for wheezing or shortness of breath.  . Calcium Carb-Cholecalciferol (CALCIUM 600-D PO) Take 1 tablet by mouth daily.  . cetirizine (ZYRTEC) 10 MG tablet Take 10 mg by mouth at bedtime.  . Cholecalciferol (VITAMIN D) 2000 units tablet Take 2,000 Units by mouth at bedtime.  Marland Kitchen dextromethorphan (DELSYM) 30 MG/5ML liquid Take 60 mg by mouth every 12 (twelve) hours as needed for cough.  . diphenhydramine-acetaminophen (TYLENOL PM) 25-500 MG TABS tablet Take 1 tablet by mouth at bedtime.   Marland Kitchen  docusate sodium (COLACE) 100 MG capsule Take 100 mg by mouth at bedtime.   . DUPIXENT 300 MG/2ML SOSY Inject 1 pen/syringe subcutaneously every other week STARTING ON DAY 15  . FLUoxetine (PROZAC) 20 MG capsule Take 40 mg by mouth daily.   . fluticasone (FLONASE) 50 MCG/ACT nasal spray Place 2 sprays into both nostrils daily. 2 sprays: nasal Special instruction: administer 2 sprays into nostrils daily once  a morning 8 am and 11 am  . furosemide (LASIX) 20 MG tablet Take 40 mg by mouth daily.   Marland Kitchen guaiFENesin (ROBITUSSIN) 100 MG/5ML liquid Take 200 mg by mouth 3 (three) times daily as needed for cough. Special instructions: every 6 hours- PRN  Give 10 mls for cough  . Heat Wraps (THERMACARE BACK/HIP) MISC Place 1 patch onto the skin as needed. Not to be worn more than 8 hours  . Liniments (SALONPAS PAIN RELIEF PATCH EX) Place 1 patch onto the skin daily as needed (neck pain (remove patch after 12 hours)).  Marland Kitchen loperamide (IMODIUM) 2 MG capsule Take 2 mg by mouth as needed for diarrhea or loose stools. Give 1 cap after loose stool. Don not exceed 4 caps in an 24 hour period.  . memantine (NAMENDA) 10 MG tablet Take 1 tablet (10 mg total) by mouth 2 (two) times daily.  . montelukast (SINGULAIR) 10 MG tablet Take 1 tablet (10 mg total) by mouth daily.  Marland Kitchen olmesartan (BENICAR) 40 MG tablet Take 40 mg by mouth daily.  Marland Kitchen omeprazole  (PRILOSEC) 20 MG capsule Take 20 mg by mouth 2 (two) times daily.   . ondansetron (ZOFRAN) 4 MG tablet Take 4 mg by mouth every 6 (six) hours as needed for nausea or vomiting.  . polyethylene glycol (MIRALAX / GLYCOLAX) packet Take 17 g by mouth daily.  Marland Kitchen spiritus frumenti (ETHYL ALCOHOL) SOLN Take 1 each by mouth See admin instructions. May have 1-2 glasses of wine every evening(4 oz each) for a total of 8 oz daily  . triamcinolone cream (KENALOG) 0.1 % Apply 1 application topically as needed.  . vitamin B-12 (CYANOCOBALAMIN) 1000 MCG tablet Take 3,000 mcg by mouth daily.  Marland Kitchen albuterol (PROVENTIL HFA;VENTOLIN HFA) 108 (90 Base) MCG/ACT inhaler Inhale 1-2 puffs into the lungs every 6 (six) hours as needed for wheezing or shortness of breath.  . [DISCONTINUED] acetaminophen (TYLENOL) 325 MG tablet Take 650 mg by mouth every 4 (four) hours as needed.  . [DISCONTINUED] budesonide (PULMICORT) 0.5 MG/2ML nebulizer solution Take 2 mLs (0.5 mg total) by nebulization 2 (two) times daily.  . [DISCONTINUED] dextromethorphan-guaiFENesin (MUCINEX DM) 30-600 MG 12hr tablet Take 1 tablet by mouth 2 (two) times daily as needed for cough (congestion).   . [DISCONTINUED] donepezil (ARICEPT) 5 MG tablet Take 0.5 tablets (2.5 mg total) by mouth at bedtime. (Patient not taking: Reported on 09/20/2018)  . [DISCONTINUED] fluticasone (FLONASE) 50 MCG/ACT nasal spray instill 2 sprays into each nostril once daily (Patient taking differently: Place 2 sprays into both nostrils daily. 2 sprays: nasal administer 2 sprays iinto nostril daily. Once a morning 8am and 11am)  . [DISCONTINUED] formoterol (PERFOROMIST) 20 MCG/2ML nebulizer solution Take 2 mLs (20 mcg total) by nebulization 2 (two) times daily.  . [DISCONTINUED] ipratropium-albuterol (DUONEB) 0.5-2.5 (3) MG/3ML SOLN Take 3 mLs by nebulization every 4 (four) hours as needed (shortness of breath/wheezing).  . [DISCONTINUED] predniSONE (DELTASONE) 20 MG tablet Take 1 tablet  (20 mg total) by mouth daily with breakfast. (Patient not taking: Reported on 01/31/2019)  . [  DISCONTINUED] rivaroxaban (XARELTO) 10 MG TABS tablet Take 1 tablet (10 mg total) by mouth daily.  . [DISCONTINUED] UNABLE TO FIND Med Name: Xolair 150 mg every 2 weeks subcutaneous   Facility-Administered Encounter Medications as of 08/15/2019  Medication  . omalizumab Arvid Right) injection 300 mg    Review of Systems  Constitutional: Positive for fatigue and fever. Negative for activity change, appetite change and chills.  HENT: Negative for congestion.   Eyes: Negative for visual disturbance.  Respiratory: Positive for cough and wheezing. Negative for shortness of breath.   Cardiovascular: Negative for chest pain, palpitations and leg swelling.  Gastrointestinal: Positive for diarrhea. Negative for abdominal pain, constipation, nausea and vomiting.  Genitourinary: Negative for dysuria.  Musculoskeletal: Positive for arthralgias and gait problem.       Uses wheelchair  Skin: Negative for color change.  Neurological: Positive for weakness. Negative for dizziness.  Psychiatric/Behavioral: Positive for confusion. Negative for agitation, behavioral problems and sleep disturbance. The patient is not nervous/anxious.     Vitals:   08/15/19 1403  BP: (!) 115/54  Pulse: 67  Temp: (!) 97.5 F (36.4 C)  TempSrc: Oral  SpO2: 93%  Weight: 157 lb 3.2 oz (71.3 kg)  Height: 5\' 2"  (1.575 m)   Body mass index is 28.75 kg/m. Physical Exam Vitals reviewed.  Constitutional:      General: She is not in acute distress.    Appearance: Normal appearance. She is not ill-appearing or toxic-appearing.  HENT:     Head: Normocephalic and atraumatic.     Right Ear: External ear normal.     Left Ear: External ear normal.     Nose: No congestion.     Mouth/Throat:     Mouth: Mucous membranes are dry.  Eyes:     Conjunctiva/sclera: Conjunctivae normal.     Pupils: Pupils are equal, round, and reactive to  light.  Cardiovascular:     Rate and Rhythm: Normal rate and regular rhythm.  Pulmonary:     Effort: Pulmonary effort is normal.     Breath sounds: Wheezing and rhonchi present.  Abdominal:     General: Bowel sounds are normal. There is no distension.     Palpations: Abdomen is soft. There is no mass.     Tenderness: There is no abdominal tenderness. There is no guarding or rebound.  Musculoskeletal:        General: Normal range of motion.     Cervical back: Neck supple.     Right lower leg: No edema.     Left lower leg: No edema.  Lymphadenopathy:     Cervical: No cervical adenopathy.  Skin:    General: Skin is warm and dry.  Neurological:     General: No focal deficit present.     Mental Status: She is alert. Mental status is at baseline.     Cranial Nerves: No cranial nerve deficit.     Motor: Weakness present.     Gait: Gait abnormal.     Comments: Uses wheelchair  Psychiatric:        Mood and Affect: Mood normal.        Behavior: Behavior normal.     Comments: pleasant     Labs reviewed:  Need to abstract labs from prior PCP into epic Basic Metabolic Panel: No results for input(s): NA, K, CL, CO2, GLUCOSE, BUN, CREATININE, CALCIUM, MG, PHOS in the last 8760 hours. Liver Function Tests: No results for input(s): AST, ALT, ALKPHOS, BILITOT, PROT, ALBUMIN in  the last 8760 hours. No results for input(s): LIPASE, AMYLASE in the last 8760 hours. No results for input(s): AMMONIA in the last 8760 hours. CBC: No results for input(s): WBC, NEUTROABS, HGB, HCT, MCV, PLT in the last 8760 hours. Cardiac Enzymes: No results for input(s): CKTOTAL, CKMB, CKMBINDEX, TROPONINI in the last 8760 hours. BNP: Invalid input(s): POCBNP No results found for: HGBA1C Lab Results  Component Value Date   TSH 2.440 09/04/2015   Lab Results  Component Value Date   [id number] 762 09/04/2015   Lab Results  Component Value Date   FOLATE 5.1 09/04/2015    Imaging and Procedures obtained  prior to SNF admission: DG Chest 2 View  Result Date: 02/02/2018 CLINICAL DATA:  Cough, wheezing. EXAM: CHEST - 2 VIEW COMPARISON:  Radiographs of July 24, 2017. FINDINGS: The heart size and mediastinal contours are within normal limits. Atherosclerosis of thoracic aorta is noted. No pneumothorax or pleural effusion is noted. Both lungs are clear. The visualized skeletal structures are unremarkable. IMPRESSION: No active cardiopulmonary disease. Aortic Atherosclerosis (ICD10-I70.0). Electronically Signed   By: Marijo Conception, M.D.   On: 02/02/2018 10:54    Assessment/Plan 1. COVID-19 -seems she fortunately has a mild case--doing quite well--has some abnormal lung sounds and intake not as good as baseline, some cough, had some diarrhea, but all improving now -encourage po intake, continue conservative measures per facility protocol  2. Late onset Alzheimer's disease without behavioral disturbance ([place]) -is pleasantly confused, staff do not report worsening of her memory or delirium at this point, she has stayed in her apt in snf 3 which likely has helped  3. Generalized weakness -has been on her problem list so not new, uses manual wheelchair for mobility  -if worsens with infection, may need PT  -for now, encourage positional changes, IS and acapella use per protocol when accessible  4. Moderate persistent asthma without complication -is on albuterol, albuterol nebs, delsym, singulair, dupixent injections every other week through pulmonary   5. Benign essential HTN -bp at goal, continues on lasix 40mg  daily, benicar -if intake of fluids declines, will need lasix held and monitor  6. Primary osteoarthritis of right knee -uses tylenol nightly and also on tylenol pm which I advise against in her age group--will need to discuss with family to consider stopping it  7. Vocal cord dysfunction -diagnosed by pulmonary as cause of some of the wheezing staff were hearing--coming from throat  not lungs  Family/ staff Communication: discussed with SNF3 nurse  Labs/tests ordered:  No new added--need to put old ones into epic and get notes scanned from [place]. Jaryn Hocutt, D.O. [facility] [address],  [address] Cell Phone (Mon-Fri 8am-5pm):  [phone] On Call:  [phone] & follow prompts after 5pm & weekends Office Phone:  [phone] Office Fax:  [phone]

## 2019-08-25 ENCOUNTER — Encounter: Payer: Self-pay | Admitting: Internal Medicine

## 2019-08-25 DIAGNOSIS — M1990 Unspecified osteoarthritis, unspecified site: Secondary | ICD-10-CM

## 2019-08-25 DIAGNOSIS — F339 Major depressive disorder, recurrent, unspecified: Secondary | ICD-10-CM | POA: Insufficient documentation

## 2019-08-25 DIAGNOSIS — M810 Age-related osteoporosis without current pathological fracture: Secondary | ICD-10-CM

## 2019-08-25 DIAGNOSIS — F0280 Dementia in other diseases classified elsewhere without behavioral disturbance: Secondary | ICD-10-CM | POA: Insufficient documentation

## 2019-08-25 DIAGNOSIS — H269 Unspecified cataract: Secondary | ICD-10-CM | POA: Insufficient documentation

## 2019-08-25 DIAGNOSIS — E559 Vitamin D deficiency, unspecified: Secondary | ICD-10-CM | POA: Insufficient documentation

## 2019-08-25 DIAGNOSIS — F02818 Dementia in other diseases classified elsewhere, unspecified severity, with other behavioral disturbance: Secondary | ICD-10-CM | POA: Insufficient documentation

## 2019-08-25 DIAGNOSIS — I120 Hypertensive chronic kidney disease with stage 5 chronic kidney disease or end stage renal disease: Secondary | ICD-10-CM | POA: Insufficient documentation

## 2019-08-25 DIAGNOSIS — R32 Unspecified urinary incontinence: Secondary | ICD-10-CM | POA: Insufficient documentation

## 2019-08-25 DIAGNOSIS — F325 Major depressive disorder, single episode, in full remission: Secondary | ICD-10-CM | POA: Insufficient documentation

## 2019-08-25 DIAGNOSIS — R159 Full incontinence of feces: Secondary | ICD-10-CM | POA: Insufficient documentation

## 2019-08-25 DIAGNOSIS — N183 Chronic kidney disease, stage 3 unspecified: Secondary | ICD-10-CM

## 2019-08-25 DIAGNOSIS — L509 Urticaria, unspecified: Secondary | ICD-10-CM

## 2019-08-25 DIAGNOSIS — N1832 Chronic kidney disease, stage 3b: Secondary | ICD-10-CM | POA: Insufficient documentation

## 2019-08-25 DIAGNOSIS — K219 Gastro-esophageal reflux disease without esophagitis: Secondary | ICD-10-CM | POA: Insufficient documentation

## 2019-08-25 DIAGNOSIS — N185 Chronic kidney disease, stage 5: Secondary | ICD-10-CM

## 2019-08-25 DIAGNOSIS — G301 Alzheimer's disease with late onset: Secondary | ICD-10-CM | POA: Insufficient documentation

## 2019-09-11 ENCOUNTER — Non-Acute Institutional Stay (SKILLED_NURSING_FACILITY): Payer: Medicare Other | Admitting: Adult Health

## 2019-09-11 ENCOUNTER — Encounter: Payer: Self-pay | Admitting: Adult Health

## 2019-09-11 DIAGNOSIS — J4540 Moderate persistent asthma, uncomplicated: Secondary | ICD-10-CM

## 2019-09-11 DIAGNOSIS — G301 Alzheimer's disease with late onset: Secondary | ICD-10-CM

## 2019-09-11 DIAGNOSIS — I700 Atherosclerosis of aorta: Secondary | ICD-10-CM | POA: Diagnosis not present

## 2019-09-11 DIAGNOSIS — M810 Age-related osteoporosis without current pathological fracture: Secondary | ICD-10-CM

## 2019-09-11 DIAGNOSIS — Z66 Do not resuscitate: Secondary | ICD-10-CM | POA: Diagnosis not present

## 2019-09-11 DIAGNOSIS — M542 Cervicalgia: Secondary | ICD-10-CM | POA: Diagnosis not present

## 2019-09-11 DIAGNOSIS — J302 Other seasonal allergic rhinitis: Secondary | ICD-10-CM

## 2019-09-11 DIAGNOSIS — F0280 Dementia in other diseases classified elsewhere without behavioral disturbance: Secondary | ICD-10-CM | POA: Diagnosis not present

## 2019-09-11 NOTE — Progress Notes (Addendum)
Location:  Occupational psychologist of Service:  SNF (31) Provider:   Cindi Carbon, ANP [place] [phone]   Gayland Curry, DO  Patient Care Team: Gayland Curry, DO as PCP - General (Geriatric Medicine) Elsie Stain, MD (Pulmonary Disease)  Extended Emergency Contact Information Primary Emergency Contact: Earlene Plater of [place] Phone: [phone] Mobile Phone: [phone] Relation: Daughter  Code Status:  DNR Goals of care: Advanced Directive information Advanced Directives 09/11/2019  Does Patient Have a Medical Advance Directive? Yes  Type of Paramedic of [place];Out of facility DNR (pink MOST or yellow form);Living will  Does patient want to make changes to medical advance directive? No - Patient declined  Copy of [place] in Chart? Yes - validated most recent copy scanned in chart (See row information)  Pre-existing out of facility DNR order (yellow form or pink MOST form) Yellow form placed in chart (order not valid for inpatient use)     Chief Complaint  Patient presents with  . Medical Management of Chronic Issues    HPI:  Pt is a 86 y.o. female seen today for medical management of chronic diseases.    She was diagnosed with Covid 19 in January of 2021 and only had mild symptoms. She denies any sob, cough, fever, or body aches.   She requires a hoyer lift for transfers and is not ambulatory. Noted hx of OP. No recent dexa scan for review.  She reports neck pain on the right side that feels like a muscle spasm. Her nurse reports that it has been bothersome lately but has been going on for quite several weeks. The pain does not radiate and she does not have any numbness or tingling. She had a CT of the cervical neck in 2018 which showed spondylosis at C5-7 and noted a displaced fracture at the right foramen of C1.  She did f/u with Dr. Annette Stable for this  issue and no further intervention was required.   She has had significant issues in the past with moderate asthma and rhinitis. She denies any cough, sob, runny nose, wheeze, sneeze, etc. Her nurse reports that since she was placed on Dupixent she has been doing well.   Past Medical History:  Diagnosis Date  . Age-related osteoporosis without current pathological fracture   . Alzheimer's disease ([place])    with late onset  . Arthritis   . Asthma   . Deficiency of other specified B group vitamins   . Depression   . Environmental allergies    mold  . Fall   . GERD without esophagitis   . Hyperlipidemia   . Hypertension   . Hypertensive kidney disease with CKD (chronic kidney disease)    with stage I through stage 4 CKD   . Memory loss   . Seasonal allergies   . Unspecified osteoarthritis, unspecified site    Past Surgical History:  Procedure Laterality Date  . HIP ARTHROPLASTY  06/25/2011   Procedure: ARTHROPLASTY BIPOLAR HIP;  Surgeon: Laurice Record [place];  Location: [facility];  Service: Orthopedics;  Laterality: Right;  . NASAL SINUS SURGERY    . VAGINAL HYSTERECTOMY      Allergies  Allergen Reactions  . Azithromycin Other (See Comments)    Nausea, weakness   . Aspirin Other (See Comments)    Unknown reaction  . Molds & Smuts   . Septra [Sulfamethoxazole-Trimethoprim]   . Sulfa Antibiotics   .  Sulfonamide Derivatives Other (See Comments)    Unknown reaction    Outpatient Encounter Medications as of 09/11/2019  Medication Sig  . budesonide (PULMICORT) 0.5 MG/2ML nebulizer solution Take 0.5 mg by nebulization 2 (two) times daily.  . formoterol (PERFOROMIST) 20 MCG/2ML nebulizer solution Take 20 mcg by nebulization 2 (two) times daily.  Marland Kitchen ipratropium-albuterol (DUONEB) 0.5-2.5 (3) MG/3ML SOLN Take 3 mLs by nebulization every 4 (four) hours as needed.  Marland Kitchen acetaminophen (TYLENOL) 500 MG tablet Take 500 mg by mouth at bedtime.  Marland Kitchen albuterol (PROVENTIL HFA;VENTOLIN HFA) 108 (90  Base) MCG/ACT inhaler Inhale 1-2 puffs into the lungs every 6 (six) hours as needed for wheezing or shortness of breath.  Marland Kitchen albuterol (PROVENTIL) (2.5 MG/3ML) 0.083% nebulizer solution Take 3 mLs (2.5 mg total) by nebulization every 4 (four) hours as needed for wheezing or shortness of breath.  . Calcium Carb-Cholecalciferol (CALCIUM 600-D PO) Take 1 tablet by mouth daily.  . calcium carbonate (OSCAL) 1500 (600 Ca) MG TABS tablet Take 600 mg of elemental calcium by mouth daily. Take one capsule daily: Route; ORAL  . Camphor-Menthol-Methyl Sal 1.2-5.7-6.3 % PTCH Apply topically. 12 hours as needed for pain Route: external  . cetirizine (ZYRTEC) 10 MG tablet Take 10 mg by mouth at bedtime.  . Cholecalciferol (VITAMIN D) 2000 units tablet Take 2,000 Units by mouth at bedtime.  Marland Kitchen dextromethorphan (DELSYM) 30 MG/5ML liquid Take 60 mg by mouth every 12 (twelve) hours as needed for cough.  . diphenhydramine-acetaminophen (TYLENOL PM) 25-500 MG TABS tablet Take 1 tablet by mouth at bedtime.   . docusate sodium (COLACE) 100 MG capsule Take 100 mg by mouth at bedtime.   . DUPIXENT 300 MG/2ML SOSY Inject 1 pen/syringe subcutaneously every other week STARTING ON DAY 15  . FLUoxetine (PROZAC) 20 MG capsule Take 40 mg by mouth daily.   . fluticasone (FLONASE) 50 MCG/ACT nasal spray Place 2 sprays into both nostrils daily. 2 sprays: nasal Special instruction: administer 2 sprays into nostrils daily once  a morning 8 am and 11 am  . furosemide (LASIX) 20 MG tablet Take 40 mg by mouth daily.   Marland Kitchen guaiFENesin (ROBITUSSIN) 100 MG/5ML liquid Take 200 mg by mouth 3 (three) times daily as needed for cough. Special instructions: every 6 hours- PRN  Give 10 mls for cough  . Heat Wraps (THERMACARE BACK/HIP) MISC Place 1 patch onto the skin as needed. Not to be worn more than 8 hours  . Liniments (SALONPAS PAIN RELIEF PATCH EX) Place 1 patch onto the skin daily as needed (neck pain (remove patch after 12 hours)).  Marland Kitchen  loperamide (IMODIUM) 2 MG capsule Take 2 mg by mouth as needed for diarrhea or loose stools. Give 1 cap after loose stool. Don not exceed 4 caps in an 24 hour period.  . memantine (NAMENDA) 10 MG tablet Take 1 tablet (10 mg total) by mouth 2 (two) times daily.  . montelukast (SINGULAIR) 10 MG tablet Take 1 tablet (10 mg total) by mouth daily.  Marland Kitchen olmesartan (BENICAR) 40 MG tablet Take 40 mg by mouth daily.  Marland Kitchen omeprazole (PRILOSEC) 20 MG capsule Take 20 mg by mouth 2 (two) times daily.   . ondansetron (ZOFRAN) 4 MG tablet Take 4 mg by mouth every 6 (six) hours as needed for nausea or vomiting.  . polyethylene glycol (MIRALAX / GLYCOLAX) packet Take 17 g by mouth daily.  . promethazine (PHENERGAN) 25 MG tablet Take 25 mg by mouth every 6 (six) hours as needed  for nausea or vomiting. 1 tab po tid;  prn nausea route: ORAL  . spiritus frumenti (ETHYL ALCOHOL) SOLN Take 1 each by mouth See admin instructions. May have 1-2 glasses of wine every evening(4 oz each) for a total of 8 oz daily  . triamcinolone cream (KENALOG) 0.1 % Apply 1 application topically as needed.  . vitamin B-12 (CYANOCOBALAMIN) 1000 MCG tablet Take 3,000 mcg by mouth daily.  . [DISCONTINUED] rivaroxaban (XARELTO) 10 MG TABS tablet Take 1 tablet (10 mg total) by mouth daily.   Facility-Administered Encounter Medications as of 09/11/2019  Medication  . omalizumab Arvid Right) injection 300 mg    Review of Systems  Constitutional: Negative for activity change, appetite change, chills, diaphoresis, fatigue, fever and unexpected weight change.  HENT: Negative for congestion.   Respiratory: Negative for cough, shortness of breath and wheezing.   Cardiovascular: Negative for chest pain, palpitations and leg swelling.  Gastrointestinal: Negative for abdominal distention, abdominal pain, constipation and diarrhea.  Genitourinary: Negative for difficulty urinating and dysuria.  Musculoskeletal: Positive for arthralgias, gait problem and neck  pain. Negative for back pain, joint swelling and myalgias.  Neurological: Negative for dizziness, tremors, seizures, syncope, facial asymmetry, speech difficulty, weakness, light-headedness, numbness and headaches.  Psychiatric/Behavioral: Positive for confusion. Negative for agitation and behavioral problems.    Immunization History  Administered Date(s) Administered  . Influenza Inj Mdck Quad Pf 05/18/2019  . Influenza Split 04/04/2012, 05/03/2013, 05/10/2015  . Influenza Whole 04/18/2008, 06/04/2009, 04/03/2010, 03/30/2011  . Influenza, High Dose Seasonal PF 04/20/2018  . Influenza,inj,Quad PF,6+ Mos 03/26/2017  . Influenza-Unspecified 03/26/2010, 04/13/2012, 05/08/2013, 05/14/2014, 06/03/2014, 05/13/2015, 04/27/2016, 04/29/2016  . Moderna SARS-COVID-2 Vaccination 09/05/2019  . Pneumococcal Conjugate-13 03/05/2014  . Pneumococcal Polysaccharide-23 06/04/2009  . Pneumococcal-Unspecified 03/13/2009  . Td 03/13/2009, 04/13/2012  . Zoster 03/21/2010   Pertinent  Health Maintenance Due  Topic Date Due  . INFLUENZA VACCINE  Completed  . PNA vac Low Risk Adult  Completed  . DEXA SCAN  Discontinued   Fall Risk  12/07/2012  Falls in the past year? Yes  Number falls in past yr: 2 or more  Risk Factor Category  High Fall Risk  Risk for fall due to : Impaired balance/gait   Functional Status Survey:    Vitals:   09/11/19 1513  Weight: 152 lb (68.9 kg)   Body mass index is 27.8 kg/m. Physical Exam Vitals and nursing note reviewed.  Constitutional:      General: She is not in acute distress.    Appearance: She is not diaphoretic.  HENT:     Head: Normocephalic and atraumatic.  Neck:     Vascular: No JVD.     Comments: Decreased neck ROM. Muscle spasm on exam palpated on the right trap muscle. No warmth, tenderness, or open areas. Cardiovascular:     Rate and Rhythm: Normal rate and regular rhythm.     Heart sounds: No murmur.  Pulmonary:     Effort: Pulmonary effort is  normal. No respiratory distress.     Breath sounds: Wheezing present.  Abdominal:     General: Bowel sounds are normal. There is no distension.     Palpations: Abdomen is soft.     Tenderness: There is no abdominal tenderness.  Musculoskeletal:     Cervical back: No rigidity or tenderness.     Right lower leg: No edema.     Left lower leg: No edema.  Lymphadenopathy:     Cervical: No cervical adenopathy.  Skin:  General: Skin is warm and dry.  Neurological:     General: No focal deficit present.     Mental Status: She is alert. Mental status is at baseline.  Psychiatric:        Mood and Affect: Mood normal.     Labs reviewed: No results for input(s): NA, K, CL, CO2, GLUCOSE, BUN, CREATININE, CALCIUM, MG, PHOS in the last 8760 hours. No results for input(s): AST, ALT, ALKPHOS, BILITOT, PROT, ALBUMIN in the last 8760 hours. No results for input(s): WBC, NEUTROABS, HGB, HCT, MCV, PLT in the last 8760 hours. Lab Results  Component Value Date   TSH 2.440 09/04/2015   No results found for: HGBA1C No results found for: CHOL, HDL, LDLCALC, LDLDIRECT, TRIG, CHOLHDL  Significant Diagnostic Results in last 30 days:  No results found.  Assessment/Plan  1. Late onset Alzheimer's disease without behavioral disturbance ([place]) MMSE 22/30 on 07/13/19 Continue Namenda 10 mg bid and supportive care in the skilled unit.   2. DNR (do not resuscitate) Up dated in epic - DNR (Do Not Resuscitate)  3. Moderate persistent asthma without complication Controlled with a complex regimen, followed by Dr. [place] Bells.   4. Seasonal allergic rhinitis, unspecified trigger No current symptoms Continue Zyrtec 10 mg qhs Singulair, and Flonase  5. Neck pain Continues with mild neck pain which appears to be related to a muscle spasm with underlying arthritic changes.  No red flags at this time. Continue tylenol and salonpas patch.  OT to eval and treat  6. Aortic atherosclerosis ([place]) Noted on CT in  2017. Given her advanced age, skilled care status, and dementia no further treatment is indicated.   7. Osteoporosis No longer having dexa scans as she is not ambulatory Continue Ca and Vit D supplementation.   8. HTN Nurse reports that her Lasix and benicar are being held frequently due to low bp as the parameter is set at 120/60 to hold. BP running in the 90s frequently. Will reduce lasix to 20 mg qd and Benicar to 20 mg qd with same parameters. Given her advanced age driving the bp this low is not recommended.   Family/ staff Communication: nurse  Labs/tests ordered:  NA

## 2019-09-14 NOTE — Addendum Note (Signed)
Addended by: Barnie Mort on: 09/14/2019 09:16 AM   Modules accepted: Orders

## 2019-09-15 DIAGNOSIS — R293 Abnormal posture: Secondary | ICD-10-CM | POA: Diagnosis not present

## 2019-09-15 DIAGNOSIS — F0280 Dementia in other diseases classified elsewhere without behavioral disturbance: Secondary | ICD-10-CM | POA: Diagnosis not present

## 2019-09-15 DIAGNOSIS — M542 Cervicalgia: Secondary | ICD-10-CM | POA: Diagnosis not present

## 2019-09-15 DIAGNOSIS — M545 Low back pain: Secondary | ICD-10-CM | POA: Diagnosis not present

## 2019-09-15 DIAGNOSIS — U071 COVID-19: Secondary | ICD-10-CM | POA: Diagnosis not present

## 2019-09-15 DIAGNOSIS — M21061 Valgus deformity, not elsewhere classified, right knee: Secondary | ICD-10-CM | POA: Diagnosis not present

## 2019-09-15 DIAGNOSIS — G301 Alzheimer's disease with late onset: Secondary | ICD-10-CM | POA: Diagnosis not present

## 2019-09-15 DIAGNOSIS — M1711 Unilateral primary osteoarthritis, right knee: Secondary | ICD-10-CM | POA: Diagnosis not present

## 2019-09-15 DIAGNOSIS — M25561 Pain in right knee: Secondary | ICD-10-CM | POA: Diagnosis not present

## 2019-09-19 DIAGNOSIS — M545 Low back pain: Secondary | ICD-10-CM | POA: Diagnosis not present

## 2019-09-19 DIAGNOSIS — M542 Cervicalgia: Secondary | ICD-10-CM | POA: Diagnosis not present

## 2019-09-19 DIAGNOSIS — M21061 Valgus deformity, not elsewhere classified, right knee: Secondary | ICD-10-CM | POA: Diagnosis not present

## 2019-09-19 DIAGNOSIS — R293 Abnormal posture: Secondary | ICD-10-CM | POA: Diagnosis not present

## 2019-09-19 DIAGNOSIS — M25561 Pain in right knee: Secondary | ICD-10-CM | POA: Diagnosis not present

## 2019-09-19 DIAGNOSIS — U071 COVID-19: Secondary | ICD-10-CM | POA: Diagnosis not present

## 2019-09-20 DIAGNOSIS — M21061 Valgus deformity, not elsewhere classified, right knee: Secondary | ICD-10-CM | POA: Diagnosis not present

## 2019-09-20 DIAGNOSIS — M545 Low back pain: Secondary | ICD-10-CM | POA: Diagnosis not present

## 2019-09-20 DIAGNOSIS — U071 COVID-19: Secondary | ICD-10-CM | POA: Diagnosis not present

## 2019-09-20 DIAGNOSIS — M25561 Pain in right knee: Secondary | ICD-10-CM | POA: Diagnosis not present

## 2019-09-20 DIAGNOSIS — R293 Abnormal posture: Secondary | ICD-10-CM | POA: Diagnosis not present

## 2019-09-20 DIAGNOSIS — M542 Cervicalgia: Secondary | ICD-10-CM | POA: Diagnosis not present

## 2019-09-22 DIAGNOSIS — M25561 Pain in right knee: Secondary | ICD-10-CM | POA: Diagnosis not present

## 2019-09-22 DIAGNOSIS — M545 Low back pain: Secondary | ICD-10-CM | POA: Diagnosis not present

## 2019-09-22 DIAGNOSIS — R293 Abnormal posture: Secondary | ICD-10-CM | POA: Diagnosis not present

## 2019-09-22 DIAGNOSIS — M21061 Valgus deformity, not elsewhere classified, right knee: Secondary | ICD-10-CM | POA: Diagnosis not present

## 2019-09-22 DIAGNOSIS — M542 Cervicalgia: Secondary | ICD-10-CM | POA: Diagnosis not present

## 2019-09-22 DIAGNOSIS — U071 COVID-19: Secondary | ICD-10-CM | POA: Diagnosis not present

## 2019-09-25 DIAGNOSIS — U071 COVID-19: Secondary | ICD-10-CM | POA: Diagnosis not present

## 2019-09-25 DIAGNOSIS — R293 Abnormal posture: Secondary | ICD-10-CM | POA: Diagnosis not present

## 2019-09-25 DIAGNOSIS — M21061 Valgus deformity, not elsewhere classified, right knee: Secondary | ICD-10-CM | POA: Diagnosis not present

## 2019-09-25 DIAGNOSIS — M542 Cervicalgia: Secondary | ICD-10-CM | POA: Diagnosis not present

## 2019-09-25 DIAGNOSIS — M545 Low back pain: Secondary | ICD-10-CM | POA: Diagnosis not present

## 2019-09-25 DIAGNOSIS — M25561 Pain in right knee: Secondary | ICD-10-CM | POA: Diagnosis not present

## 2019-09-27 DIAGNOSIS — R293 Abnormal posture: Secondary | ICD-10-CM | POA: Diagnosis not present

## 2019-09-27 DIAGNOSIS — M545 Low back pain: Secondary | ICD-10-CM | POA: Diagnosis not present

## 2019-09-27 DIAGNOSIS — M21061 Valgus deformity, not elsewhere classified, right knee: Secondary | ICD-10-CM | POA: Diagnosis not present

## 2019-09-27 DIAGNOSIS — M542 Cervicalgia: Secondary | ICD-10-CM | POA: Diagnosis not present

## 2019-09-27 DIAGNOSIS — U071 COVID-19: Secondary | ICD-10-CM | POA: Diagnosis not present

## 2019-09-27 DIAGNOSIS — M25561 Pain in right knee: Secondary | ICD-10-CM | POA: Diagnosis not present

## 2019-09-28 DIAGNOSIS — R293 Abnormal posture: Secondary | ICD-10-CM | POA: Diagnosis not present

## 2019-09-28 DIAGNOSIS — U071 COVID-19: Secondary | ICD-10-CM | POA: Diagnosis not present

## 2019-09-28 DIAGNOSIS — M542 Cervicalgia: Secondary | ICD-10-CM | POA: Diagnosis not present

## 2019-09-28 DIAGNOSIS — M21061 Valgus deformity, not elsewhere classified, right knee: Secondary | ICD-10-CM | POA: Diagnosis not present

## 2019-09-28 DIAGNOSIS — M545 Low back pain: Secondary | ICD-10-CM | POA: Diagnosis not present

## 2019-09-28 DIAGNOSIS — M25561 Pain in right knee: Secondary | ICD-10-CM | POA: Diagnosis not present

## 2019-10-02 DIAGNOSIS — M21061 Valgus deformity, not elsewhere classified, right knee: Secondary | ICD-10-CM | POA: Diagnosis not present

## 2019-10-02 DIAGNOSIS — U071 COVID-19: Secondary | ICD-10-CM | POA: Diagnosis not present

## 2019-10-02 DIAGNOSIS — M542 Cervicalgia: Secondary | ICD-10-CM | POA: Diagnosis not present

## 2019-10-02 DIAGNOSIS — M25561 Pain in right knee: Secondary | ICD-10-CM | POA: Diagnosis not present

## 2019-10-02 DIAGNOSIS — F0280 Dementia in other diseases classified elsewhere without behavioral disturbance: Secondary | ICD-10-CM | POA: Diagnosis not present

## 2019-10-02 DIAGNOSIS — M545 Low back pain: Secondary | ICD-10-CM | POA: Diagnosis not present

## 2019-10-02 DIAGNOSIS — R293 Abnormal posture: Secondary | ICD-10-CM | POA: Diagnosis not present

## 2019-10-02 DIAGNOSIS — G301 Alzheimer's disease with late onset: Secondary | ICD-10-CM | POA: Diagnosis not present

## 2019-10-02 DIAGNOSIS — M1711 Unilateral primary osteoarthritis, right knee: Secondary | ICD-10-CM | POA: Diagnosis not present

## 2019-10-04 DIAGNOSIS — R293 Abnormal posture: Secondary | ICD-10-CM | POA: Diagnosis not present

## 2019-10-04 DIAGNOSIS — M25561 Pain in right knee: Secondary | ICD-10-CM | POA: Diagnosis not present

## 2019-10-04 DIAGNOSIS — U071 COVID-19: Secondary | ICD-10-CM | POA: Diagnosis not present

## 2019-10-04 DIAGNOSIS — M21061 Valgus deformity, not elsewhere classified, right knee: Secondary | ICD-10-CM | POA: Diagnosis not present

## 2019-10-04 DIAGNOSIS — M542 Cervicalgia: Secondary | ICD-10-CM | POA: Diagnosis not present

## 2019-10-04 DIAGNOSIS — M545 Low back pain: Secondary | ICD-10-CM | POA: Diagnosis not present

## 2019-10-05 ENCOUNTER — Encounter: Payer: Self-pay | Admitting: Adult Health

## 2019-10-05 ENCOUNTER — Non-Acute Institutional Stay (SKILLED_NURSING_FACILITY): Payer: Medicare Other | Admitting: Adult Health

## 2019-10-05 DIAGNOSIS — F0280 Dementia in other diseases classified elsewhere without behavioral disturbance: Secondary | ICD-10-CM | POA: Diagnosis not present

## 2019-10-05 DIAGNOSIS — R600 Localized edema: Secondary | ICD-10-CM

## 2019-10-05 DIAGNOSIS — I10 Essential (primary) hypertension: Secondary | ICD-10-CM

## 2019-10-05 DIAGNOSIS — K219 Gastro-esophageal reflux disease without esophagitis: Secondary | ICD-10-CM

## 2019-10-05 DIAGNOSIS — J4550 Severe persistent asthma, uncomplicated: Secondary | ICD-10-CM

## 2019-10-05 DIAGNOSIS — G301 Alzheimer's disease with late onset: Secondary | ICD-10-CM

## 2019-10-06 DIAGNOSIS — M542 Cervicalgia: Secondary | ICD-10-CM | POA: Diagnosis not present

## 2019-10-06 DIAGNOSIS — U071 COVID-19: Secondary | ICD-10-CM | POA: Diagnosis not present

## 2019-10-06 DIAGNOSIS — M545 Low back pain: Secondary | ICD-10-CM | POA: Diagnosis not present

## 2019-10-06 DIAGNOSIS — J4550 Severe persistent asthma, uncomplicated: Secondary | ICD-10-CM | POA: Insufficient documentation

## 2019-10-06 DIAGNOSIS — R293 Abnormal posture: Secondary | ICD-10-CM | POA: Diagnosis not present

## 2019-10-06 DIAGNOSIS — M25561 Pain in right knee: Secondary | ICD-10-CM | POA: Diagnosis not present

## 2019-10-06 DIAGNOSIS — M21061 Valgus deformity, not elsewhere classified, right knee: Secondary | ICD-10-CM | POA: Diagnosis not present

## 2019-10-06 NOTE — Progress Notes (Signed)
Location:  Occupational psychologist of Service:  SNF (31) Provider:   Cindi Carbon, ANP [place] [phone]   Gayland Curry, DO  Patient Care Team: Gayland Curry, DO as PCP - General (Geriatric Medicine) Elsie Stain, MD (Pulmonary Disease)  Extended Emergency Contact Information Primary Emergency Contact: Earlene Plater of [place] Phone: [phone] Mobile Phone: [phone] Relation: Daughter  Code Status:  DNR Goals of care: Advanced Directive information Advanced Directives 09/11/2019  Does Patient Have a Medical Advance Directive? Yes  Type of Paramedic of [place];Out of facility DNR (pink MOST or yellow form);Living will  Does patient want to make changes to medical advance directive? No - Patient declined  Copy of [place] in Chart? Yes - validated most recent copy scanned in chart (See row information)  Pre-existing out of facility DNR order (yellow form or pink MOST form) Yellow form placed in chart (order not valid for inpatient use)     Chief Complaint  Patient presents with  . Medical Management of Chronic Issues    HPI:  Pt is a 86 y.o. female seen today for medical management of chronic diseases.    She reports that she is doing well. She has a hx of significant resp issues and severe asthma. She denies any cough,sob, wheezing, etc. She received her second covid vaccine and had some wheezing after that which subsided with a nebulizer.   The nurse reports in the evening she becomes more confused and is a fall risk as she tries to get out of bed without help. She requires a lift for tranfers.    Past Medical History:  Diagnosis Date  . Age-related osteoporosis without current pathological fracture   . Alzheimer's disease ([place])    with late onset  . Arthritis   . Asthma   . Deficiency of other specified B group vitamins   . Depression   .  Environmental allergies    mold  . Fall   . GERD without esophagitis   . Hyperlipidemia   . Hypertension   . Hypertensive kidney disease with CKD (chronic kidney disease)    with stage I through stage 4 CKD   . Memory loss   . Seasonal allergies   . Unspecified osteoarthritis, unspecified site    Past Surgical History:  Procedure Laterality Date  . HIP ARTHROPLASTY  06/25/2011   Procedure: ARTHROPLASTY BIPOLAR HIP;  Surgeon: Laurice Record [place];  Location: [facility];  Service: Orthopedics;  Laterality: Right;  . NASAL SINUS SURGERY    . VAGINAL HYSTERECTOMY      Allergies  Allergen Reactions  . Azithromycin Other (See Comments)    Nausea, weakness   . Aspirin Other (See Comments)    Unknown reaction  . Molds & Smuts   . Septra [Sulfamethoxazole-Trimethoprim]   . Sulfa Antibiotics   . Sulfonamide Derivatives Other (See Comments)    Unknown reaction    Outpatient Encounter Medications as of 10/05/2019  Medication Sig  . acetaminophen (TYLENOL) 500 MG tablet Take 500 mg by mouth at bedtime.  . budesonide (PULMICORT) 0.5 MG/2ML nebulizer solution Take 0.5 mg by nebulization 2 (two) times daily.  . Calcium Carb-Cholecalciferol (CALCIUM 600-D PO) Take 1 tablet by mouth daily.  . calcium carbonate (OSCAL) 1500 (600 Ca) MG TABS tablet Take 600 mg of elemental calcium by mouth daily. Take one capsule daily: Route; ORAL  . Cholecalciferol (VITAMIN D) 2000 units tablet Take  2,000 Units by mouth at bedtime.  Marland Kitchen dextromethorphan (DELSYM) 30 MG/5ML liquid Take 60 mg by mouth every 12 (twelve) hours as needed for cough.  . docusate sodium (COLACE) 100 MG capsule Take 100 mg by mouth at bedtime.   . DUPIXENT 300 MG/2ML SOSY Inject 1 pen/syringe subcutaneously every other week STARTING ON DAY 15  . EPINEPHrine (EPIPEN 2-PAK) 0.3 mg/0.3 mL IJ SOAJ injection Inject 0.3 mg into the muscle as needed for anaphylaxis. Prescribed with dupexient  . FLUoxetine (PROZAC) 20 MG capsule Take 40 mg by mouth  daily.   . fluticasone (FLONASE) 50 MCG/ACT nasal spray Place 2 sprays into both nostrils daily. 2 sprays: nasal Special instruction: administer 2 sprays into nostrils daily once  a morning 8 am and 11 am  . formoterol (PERFOROMIST) 20 MCG/2ML nebulizer solution Take 20 mcg by nebulization 2 (two) times daily.  . furosemide (LASIX) 40 MG tablet Take 40 mg by mouth daily.  Marland Kitchen ipratropium-albuterol (DUONEB) 0.5-2.5 (3) MG/3ML SOLN Take 3 mLs by nebulization every 4 (four) hours as needed.  . Liniments (SALONPAS PAIN RELIEF PATCH EX) Place 1 patch onto the skin daily as needed (neck pain (remove patch after 12 hours)).  Marland Kitchen memantine (NAMENDA) 10 MG tablet Take 1 tablet (10 mg total) by mouth 2 (two) times daily.  . montelukast (SINGULAIR) 10 MG tablet Take 1 tablet (10 mg total) by mouth daily.  Marland Kitchen olmesartan (BENICAR) 20 MG tablet Take 40 mg by mouth daily.   Marland Kitchen omeprazole (PRILOSEC) 20 MG capsule Take 20 mg by mouth daily.   . ondansetron (ZOFRAN) 4 MG tablet Take 4 mg by mouth every 6 (six) hours as needed for nausea or vomiting.  . polyethylene glycol (MIRALAX / GLYCOLAX) packet Take 17 g by mouth daily.  Marland Kitchen spiritus frumenti (ETHYL ALCOHOL) SOLN Take 1 each by mouth See admin instructions. May have 1-2 glasses of wine every evening(4 oz each) for a total of 8 oz daily  . triamcinolone cream (KENALOG) 0.1 % Apply 1 application topically as needed.  . vitamin B-12 (CYANOCOBALAMIN) 1000 MCG tablet Take 3,000 mcg by mouth daily.  . [DISCONTINUED] albuterol (PROVENTIL) (2.5 MG/3ML) 0.083% nebulizer solution Take 3 mLs (2.5 mg total) by nebulization every 4 (four) hours as needed for wheezing or shortness of breath.  . [DISCONTINUED] furosemide (LASIX) 20 MG tablet Take 20 mg by mouth daily.   . [DISCONTINUED] albuterol (PROVENTIL HFA;VENTOLIN HFA) 108 (90 Base) MCG/ACT inhaler Inhale 1-2 puffs into the lungs every 6 (six) hours as needed for wheezing or shortness of breath.  . [DISCONTINUED]  Camphor-Menthol-Methyl Sal 1.2-5.7-6.3 % PTCH Apply topically. 12 hours as needed for pain Route: external  . [DISCONTINUED] cetirizine (ZYRTEC) 10 MG tablet Take 10 mg by mouth at bedtime.  . [DISCONTINUED] diphenhydramine-acetaminophen (TYLENOL PM) 25-500 MG TABS tablet Take 1 tablet by mouth at bedtime.   . [DISCONTINUED] guaiFENesin (ROBITUSSIN) 100 MG/5ML liquid Take 200 mg by mouth 3 (three) times daily as needed for cough. Special instructions: every 6 hours- PRN  Give 10 mls for cough  . [DISCONTINUED] Heat Wraps (THERMACARE BACK/HIP) MISC Place 1 patch onto the skin as needed. Not to be worn more than 8 hours  . [DISCONTINUED] loperamide (IMODIUM) 2 MG capsule Take 2 mg by mouth as needed for diarrhea or loose stools. Give 1 cap after loose stool. Don not exceed 4 caps in an 24 hour period.  . [DISCONTINUED] promethazine (PHENERGAN) 25 MG tablet Take 25 mg by mouth every 6 (six)  hours as needed for nausea or vomiting. 1 tab po tid;  prn nausea route: ORAL  . [DISCONTINUED] rivaroxaban (XARELTO) 10 MG TABS tablet Take 1 tablet (10 mg total) by mouth daily.  . [DISCONTINUED] omalizumab Arvid Right) injection 300 mg    No facility-administered encounter medications on file as of 10/05/2019.    Review of Systems  Constitutional: Negative for activity change, appetite change, chills, diaphoresis, fatigue, fever and unexpected weight change.  HENT: Negative for congestion.   Respiratory: Negative for cough, shortness of breath and wheezing.   Cardiovascular: Negative for chest pain, palpitations and leg swelling.  Gastrointestinal: Negative for abdominal distention, abdominal pain, constipation and diarrhea.  Genitourinary: Negative for difficulty urinating and dysuria.  Musculoskeletal: Positive for gait problem. Negative for arthralgias, back pain, joint swelling and myalgias.  Neurological: Negative for dizziness, tremors, seizures, syncope, facial asymmetry, speech difficulty, weakness,  light-headedness, numbness and headaches.  Psychiatric/Behavioral: Positive for confusion. Negative for agitation and behavioral problems.    Immunization History  Administered Date(s) Administered  . Influenza Inj Mdck Quad Pf 05/18/2019  . Influenza Split 04/04/2012, 05/03/2013, 05/10/2015  . Influenza Whole 04/18/2008, 06/04/2009, 04/03/2010, 03/30/2011  . Influenza, High Dose Seasonal PF 04/20/2018  . Influenza,inj,Quad PF,6+ Mos 03/26/2017  . Influenza-Unspecified 03/26/2010, 04/13/2012, 05/08/2013, 05/14/2014, 06/03/2014, 05/13/2015, 04/27/2016, 04/29/2016  . Moderna SARS-COVID-2 Vaccination 09/05/2019, 10/03/2019  . Pneumococcal Conjugate-13 03/05/2014  . Pneumococcal Polysaccharide-23 06/04/2009  . Pneumococcal-Unspecified 03/13/2009  . Td 03/13/2009, 04/13/2012  . Zoster 03/21/2010   Pertinent  Health Maintenance Due  Topic Date Due  . INFLUENZA VACCINE  Completed  . PNA vac Low Risk Adult  Completed  . DEXA SCAN  Discontinued   Fall Risk  12/07/2012  Falls in the past year? Yes  Number falls in past yr: 2 or more  Risk Factor Category  High Fall Risk  Risk for fall due to : Impaired balance/gait   Functional Status Survey:    Vitals:   10/05/19 1427  Weight: 155 lb 4.8 oz (70.4 kg)   Body mass index is 28.4 kg/m. Physical Exam Vitals and nursing note reviewed.  Constitutional:      General: She is not in acute distress.    Appearance: She is not diaphoretic.  HENT:     Head: Normocephalic and atraumatic.  Neck:     Vascular: No JVD.  Cardiovascular:     Rate and Rhythm: Normal rate and regular rhythm.     Heart sounds: No murmur.  Pulmonary:     Effort: Pulmonary effort is normal. No respiratory distress.     Breath sounds: Normal breath sounds. No wheezing.  Abdominal:     General: Bowel sounds are normal. There is no distension.     Palpations: Abdomen is soft.  Musculoskeletal:     Right lower leg: No edema.     Left lower leg: No edema.   Skin:    General: Skin is warm and dry.  Neurological:     General: No focal deficit present.     Mental Status: She is alert. Mental status is at baseline.  Psychiatric:        Mood and Affect: Mood normal.     Labs reviewed: No results for input(s): NA, K, CL, CO2, GLUCOSE, BUN, CREATININE, CALCIUM, MG, PHOS in the last 8760 hours. No results for input(s): AST, ALT, ALKPHOS, BILITOT, PROT, ALBUMIN in the last 8760 hours. No results for input(s): WBC, NEUTROABS, HGB, HCT, MCV, PLT in the last 8760 hours. Lab Results  Component Value Date   TSH 2.440 09/04/2015   No results found for: HGBA1C No results found for: CHOL, HDL, LDLCALC, LDLDIRECT, TRIG, CHOLHDL  Significant Diagnostic Results in last 30 days:  No results found.  Assessment/Plan 1. Late onset Alzheimer's disease without behavioral disturbance ([place]) MMSE 07/13/19 22/30  Moderate  Continue Namenda 10 mg bid   2. Gastro-esophageal reflux disease without esophagitis Controlled  Continue Prilosec 20 mg qd   3. Severe persistent asthma without complication Controlled with Singulair, Pulmicort, Performist, and Dupexent Followed by pulmonary   4. Essential hypertension Controlled  Continue Benicar 40 mg qd   5. Localized edema Controlled with lasix 40 mg qd  Wt Readings from Last 3 Encounters:  10/05/19 155 lb 4.8 oz (70.4 kg)  09/11/19 152 lb (68.9 kg)  08/15/19 157 lb 3.2 oz (71.3 kg)  Weight has been stable for quite some time. Decrease weights to twice weekly.       Family/ staff Communication: resident and nurse  Labs/tests ordered:  NA

## 2019-10-09 DIAGNOSIS — M545 Low back pain: Secondary | ICD-10-CM | POA: Diagnosis not present

## 2019-10-09 DIAGNOSIS — M542 Cervicalgia: Secondary | ICD-10-CM | POA: Diagnosis not present

## 2019-10-09 DIAGNOSIS — M21061 Valgus deformity, not elsewhere classified, right knee: Secondary | ICD-10-CM | POA: Diagnosis not present

## 2019-10-09 DIAGNOSIS — U071 COVID-19: Secondary | ICD-10-CM | POA: Diagnosis not present

## 2019-10-09 DIAGNOSIS — R293 Abnormal posture: Secondary | ICD-10-CM | POA: Diagnosis not present

## 2019-10-09 DIAGNOSIS — M25561 Pain in right knee: Secondary | ICD-10-CM | POA: Diagnosis not present

## 2019-10-11 DIAGNOSIS — M25561 Pain in right knee: Secondary | ICD-10-CM | POA: Diagnosis not present

## 2019-10-11 DIAGNOSIS — M542 Cervicalgia: Secondary | ICD-10-CM | POA: Diagnosis not present

## 2019-10-11 DIAGNOSIS — U071 COVID-19: Secondary | ICD-10-CM | POA: Diagnosis not present

## 2019-10-11 DIAGNOSIS — M21061 Valgus deformity, not elsewhere classified, right knee: Secondary | ICD-10-CM | POA: Diagnosis not present

## 2019-10-11 DIAGNOSIS — M545 Low back pain: Secondary | ICD-10-CM | POA: Diagnosis not present

## 2019-10-11 DIAGNOSIS — M1711 Unilateral primary osteoarthritis, right knee: Secondary | ICD-10-CM | POA: Diagnosis not present

## 2019-10-11 DIAGNOSIS — R293 Abnormal posture: Secondary | ICD-10-CM | POA: Diagnosis not present

## 2019-10-13 DIAGNOSIS — M545 Low back pain: Secondary | ICD-10-CM | POA: Diagnosis not present

## 2019-10-13 DIAGNOSIS — M25561 Pain in right knee: Secondary | ICD-10-CM | POA: Diagnosis not present

## 2019-10-13 DIAGNOSIS — R293 Abnormal posture: Secondary | ICD-10-CM | POA: Diagnosis not present

## 2019-10-13 DIAGNOSIS — U071 COVID-19: Secondary | ICD-10-CM | POA: Diagnosis not present

## 2019-10-13 DIAGNOSIS — M542 Cervicalgia: Secondary | ICD-10-CM | POA: Diagnosis not present

## 2019-10-13 DIAGNOSIS — M21061 Valgus deformity, not elsewhere classified, right knee: Secondary | ICD-10-CM | POA: Diagnosis not present

## 2019-10-16 DIAGNOSIS — M545 Low back pain: Secondary | ICD-10-CM | POA: Diagnosis not present

## 2019-10-16 DIAGNOSIS — R293 Abnormal posture: Secondary | ICD-10-CM | POA: Diagnosis not present

## 2019-10-16 DIAGNOSIS — M542 Cervicalgia: Secondary | ICD-10-CM | POA: Diagnosis not present

## 2019-10-16 DIAGNOSIS — U071 COVID-19: Secondary | ICD-10-CM | POA: Diagnosis not present

## 2019-10-16 DIAGNOSIS — M21061 Valgus deformity, not elsewhere classified, right knee: Secondary | ICD-10-CM | POA: Diagnosis not present

## 2019-10-16 DIAGNOSIS — M25561 Pain in right knee: Secondary | ICD-10-CM | POA: Diagnosis not present

## 2019-10-18 DIAGNOSIS — U071 COVID-19: Secondary | ICD-10-CM | POA: Diagnosis not present

## 2019-10-18 DIAGNOSIS — M1711 Unilateral primary osteoarthritis, right knee: Secondary | ICD-10-CM | POA: Diagnosis not present

## 2019-10-18 DIAGNOSIS — M21061 Valgus deformity, not elsewhere classified, right knee: Secondary | ICD-10-CM | POA: Diagnosis not present

## 2019-10-18 DIAGNOSIS — M545 Low back pain: Secondary | ICD-10-CM | POA: Diagnosis not present

## 2019-10-18 DIAGNOSIS — R293 Abnormal posture: Secondary | ICD-10-CM | POA: Diagnosis not present

## 2019-10-18 DIAGNOSIS — M25561 Pain in right knee: Secondary | ICD-10-CM | POA: Diagnosis not present

## 2019-10-18 DIAGNOSIS — M542 Cervicalgia: Secondary | ICD-10-CM | POA: Diagnosis not present

## 2019-10-20 DIAGNOSIS — M25561 Pain in right knee: Secondary | ICD-10-CM | POA: Diagnosis not present

## 2019-10-20 DIAGNOSIS — U071 COVID-19: Secondary | ICD-10-CM | POA: Diagnosis not present

## 2019-10-20 DIAGNOSIS — M21061 Valgus deformity, not elsewhere classified, right knee: Secondary | ICD-10-CM | POA: Diagnosis not present

## 2019-10-20 DIAGNOSIS — R293 Abnormal posture: Secondary | ICD-10-CM | POA: Diagnosis not present

## 2019-10-20 DIAGNOSIS — M545 Low back pain: Secondary | ICD-10-CM | POA: Diagnosis not present

## 2019-10-20 DIAGNOSIS — M542 Cervicalgia: Secondary | ICD-10-CM | POA: Diagnosis not present

## 2019-10-23 DIAGNOSIS — M545 Low back pain: Secondary | ICD-10-CM | POA: Diagnosis not present

## 2019-10-23 DIAGNOSIS — M21061 Valgus deformity, not elsewhere classified, right knee: Secondary | ICD-10-CM | POA: Diagnosis not present

## 2019-10-23 DIAGNOSIS — M25561 Pain in right knee: Secondary | ICD-10-CM | POA: Diagnosis not present

## 2019-10-23 DIAGNOSIS — R293 Abnormal posture: Secondary | ICD-10-CM | POA: Diagnosis not present

## 2019-10-23 DIAGNOSIS — U071 COVID-19: Secondary | ICD-10-CM | POA: Diagnosis not present

## 2019-10-23 DIAGNOSIS — M542 Cervicalgia: Secondary | ICD-10-CM | POA: Diagnosis not present

## 2019-10-25 DIAGNOSIS — R293 Abnormal posture: Secondary | ICD-10-CM | POA: Diagnosis not present

## 2019-10-25 DIAGNOSIS — M25561 Pain in right knee: Secondary | ICD-10-CM | POA: Diagnosis not present

## 2019-10-25 DIAGNOSIS — M542 Cervicalgia: Secondary | ICD-10-CM | POA: Diagnosis not present

## 2019-10-25 DIAGNOSIS — M21061 Valgus deformity, not elsewhere classified, right knee: Secondary | ICD-10-CM | POA: Diagnosis not present

## 2019-10-25 DIAGNOSIS — M1711 Unilateral primary osteoarthritis, right knee: Secondary | ICD-10-CM | POA: Diagnosis not present

## 2019-10-25 DIAGNOSIS — M545 Low back pain: Secondary | ICD-10-CM | POA: Diagnosis not present

## 2019-10-25 DIAGNOSIS — U071 COVID-19: Secondary | ICD-10-CM | POA: Diagnosis not present

## 2019-10-27 DIAGNOSIS — M21061 Valgus deformity, not elsewhere classified, right knee: Secondary | ICD-10-CM | POA: Diagnosis not present

## 2019-10-27 DIAGNOSIS — M545 Low back pain: Secondary | ICD-10-CM | POA: Diagnosis not present

## 2019-10-27 DIAGNOSIS — M542 Cervicalgia: Secondary | ICD-10-CM | POA: Diagnosis not present

## 2019-10-27 DIAGNOSIS — M25561 Pain in right knee: Secondary | ICD-10-CM | POA: Diagnosis not present

## 2019-10-27 DIAGNOSIS — U071 COVID-19: Secondary | ICD-10-CM | POA: Diagnosis not present

## 2019-10-27 DIAGNOSIS — R293 Abnormal posture: Secondary | ICD-10-CM | POA: Diagnosis not present

## 2019-10-30 DIAGNOSIS — M25561 Pain in right knee: Secondary | ICD-10-CM | POA: Diagnosis not present

## 2019-10-30 DIAGNOSIS — R293 Abnormal posture: Secondary | ICD-10-CM | POA: Diagnosis not present

## 2019-10-30 DIAGNOSIS — M542 Cervicalgia: Secondary | ICD-10-CM | POA: Diagnosis not present

## 2019-10-30 DIAGNOSIS — M21061 Valgus deformity, not elsewhere classified, right knee: Secondary | ICD-10-CM | POA: Diagnosis not present

## 2019-10-30 DIAGNOSIS — M545 Low back pain: Secondary | ICD-10-CM | POA: Diagnosis not present

## 2019-10-30 DIAGNOSIS — U071 COVID-19: Secondary | ICD-10-CM | POA: Diagnosis not present

## 2019-11-01 DIAGNOSIS — M21061 Valgus deformity, not elsewhere classified, right knee: Secondary | ICD-10-CM | POA: Diagnosis not present

## 2019-11-01 DIAGNOSIS — M545 Low back pain: Secondary | ICD-10-CM | POA: Diagnosis not present

## 2019-11-01 DIAGNOSIS — M542 Cervicalgia: Secondary | ICD-10-CM | POA: Diagnosis not present

## 2019-11-01 DIAGNOSIS — U071 COVID-19: Secondary | ICD-10-CM | POA: Diagnosis not present

## 2019-11-01 DIAGNOSIS — R293 Abnormal posture: Secondary | ICD-10-CM | POA: Diagnosis not present

## 2019-11-01 DIAGNOSIS — M25561 Pain in right knee: Secondary | ICD-10-CM | POA: Diagnosis not present

## 2019-11-06 DIAGNOSIS — F0280 Dementia in other diseases classified elsewhere without behavioral disturbance: Secondary | ICD-10-CM | POA: Diagnosis not present

## 2019-11-06 DIAGNOSIS — M1711 Unilateral primary osteoarthritis, right knee: Secondary | ICD-10-CM | POA: Diagnosis not present

## 2019-11-06 DIAGNOSIS — M542 Cervicalgia: Secondary | ICD-10-CM | POA: Diagnosis not present

## 2019-11-06 DIAGNOSIS — M25561 Pain in right knee: Secondary | ICD-10-CM | POA: Diagnosis not present

## 2019-11-06 DIAGNOSIS — U071 COVID-19: Secondary | ICD-10-CM | POA: Diagnosis not present

## 2019-11-06 DIAGNOSIS — G301 Alzheimer's disease with late onset: Secondary | ICD-10-CM | POA: Diagnosis not present

## 2019-11-06 DIAGNOSIS — R293 Abnormal posture: Secondary | ICD-10-CM | POA: Diagnosis not present

## 2019-11-06 DIAGNOSIS — M545 Low back pain: Secondary | ICD-10-CM | POA: Diagnosis not present

## 2019-11-06 DIAGNOSIS — M21061 Valgus deformity, not elsewhere classified, right knee: Secondary | ICD-10-CM | POA: Diagnosis not present

## 2019-11-08 DIAGNOSIS — M21061 Valgus deformity, not elsewhere classified, right knee: Secondary | ICD-10-CM | POA: Diagnosis not present

## 2019-11-08 DIAGNOSIS — M542 Cervicalgia: Secondary | ICD-10-CM | POA: Diagnosis not present

## 2019-11-08 DIAGNOSIS — M545 Low back pain: Secondary | ICD-10-CM | POA: Diagnosis not present

## 2019-11-08 DIAGNOSIS — M25561 Pain in right knee: Secondary | ICD-10-CM | POA: Diagnosis not present

## 2019-11-08 DIAGNOSIS — R293 Abnormal posture: Secondary | ICD-10-CM | POA: Diagnosis not present

## 2019-11-08 DIAGNOSIS — U071 COVID-19: Secondary | ICD-10-CM | POA: Diagnosis not present

## 2019-11-10 DIAGNOSIS — M545 Low back pain: Secondary | ICD-10-CM | POA: Diagnosis not present

## 2019-11-10 DIAGNOSIS — M542 Cervicalgia: Secondary | ICD-10-CM | POA: Diagnosis not present

## 2019-11-10 DIAGNOSIS — M25561 Pain in right knee: Secondary | ICD-10-CM | POA: Diagnosis not present

## 2019-11-10 DIAGNOSIS — M21061 Valgus deformity, not elsewhere classified, right knee: Secondary | ICD-10-CM | POA: Diagnosis not present

## 2019-11-10 DIAGNOSIS — R293 Abnormal posture: Secondary | ICD-10-CM | POA: Diagnosis not present

## 2019-11-10 DIAGNOSIS — U071 COVID-19: Secondary | ICD-10-CM | POA: Diagnosis not present

## 2019-11-13 DIAGNOSIS — M21061 Valgus deformity, not elsewhere classified, right knee: Secondary | ICD-10-CM | POA: Diagnosis not present

## 2019-11-13 DIAGNOSIS — M542 Cervicalgia: Secondary | ICD-10-CM | POA: Diagnosis not present

## 2019-11-13 DIAGNOSIS — R293 Abnormal posture: Secondary | ICD-10-CM | POA: Diagnosis not present

## 2019-11-13 DIAGNOSIS — U071 COVID-19: Secondary | ICD-10-CM | POA: Diagnosis not present

## 2019-11-13 DIAGNOSIS — M25561 Pain in right knee: Secondary | ICD-10-CM | POA: Diagnosis not present

## 2019-11-13 DIAGNOSIS — M545 Low back pain: Secondary | ICD-10-CM | POA: Diagnosis not present

## 2019-11-14 ENCOUNTER — Non-Acute Institutional Stay (SKILLED_NURSING_FACILITY): Payer: Medicare Other | Admitting: Internal Medicine

## 2019-11-14 ENCOUNTER — Encounter: Payer: Self-pay | Admitting: Internal Medicine

## 2019-11-14 DIAGNOSIS — G301 Alzheimer's disease with late onset: Secondary | ICD-10-CM

## 2019-11-14 DIAGNOSIS — F0280 Dementia in other diseases classified elsewhere without behavioral disturbance: Secondary | ICD-10-CM | POA: Diagnosis not present

## 2019-11-14 DIAGNOSIS — F325 Major depressive disorder, single episode, in full remission: Secondary | ICD-10-CM

## 2019-11-14 DIAGNOSIS — N185 Chronic kidney disease, stage 5: Secondary | ICD-10-CM

## 2019-11-14 DIAGNOSIS — K219 Gastro-esophageal reflux disease without esophagitis: Secondary | ICD-10-CM | POA: Diagnosis not present

## 2019-11-14 DIAGNOSIS — J383 Other diseases of vocal cords: Secondary | ICD-10-CM | POA: Diagnosis not present

## 2019-11-14 DIAGNOSIS — J4550 Severe persistent asthma, uncomplicated: Secondary | ICD-10-CM | POA: Diagnosis not present

## 2019-11-14 DIAGNOSIS — I120 Hypertensive chronic kidney disease with stage 5 chronic kidney disease or end stage renal disease: Secondary | ICD-10-CM | POA: Diagnosis not present

## 2019-11-14 NOTE — Progress Notes (Signed)
Patient ID: [name], female   DOB: [birth year], 86 y.o.   MRN: [id number]  Location:  [facility] Room Number: 121 Place of Service:  SNF ([phone]) Provider:   Gayland Curry, DO  Patient Care Team: Gayland Curry, DO as PCP - General (Geriatric Medicine) Elsie Stain, MD (Pulmonary Disease)  Extended Emergency Contact Information Primary Emergency Contact: Earlene Plater of [place] Phone: [phone] Mobile Phone: [phone] Relation: Daughter  Code Status:  DNR Goals of care: Advanced Directive information Advanced Directives 09/11/2019  Does Patient Have a Medical Advance Directive? Yes  Type of Paramedic of [place];Out of facility DNR (pink MOST or yellow form);Living will  Does patient want to make changes to medical advance directive? No - Patient declined  Copy of [place] in Chart? Yes - validated most recent copy scanned in chart (See row information)  Pre-existing out of facility DNR order (yellow form or pink MOST form) Yellow form placed in chart (order not valid for inpatient use)     Chief Complaint  Patient presents with  . Medical Management of Chronic Issues    Routine Visit    HPI:  [name] is a 86 y.o. female seen today for medical management of chronic diseases.  She has a h/o AD, osteoporosis, knee OA, severe asthma, CKDIV, allergies, htn, hyperlipidemia.  She is in snf for long-term care.  She is doing well.  Nursing had no new concerns.  [name] denies shortness of breath or wheezing.  She sleeps well.  Her weight is stable.  She is at risk for falls when she has some sundowning in the evenings.   Past Medical History:  Diagnosis Date  . Age-related osteoporosis without current pathological fracture   . Alzheimer's disease ([place])    with late onset  . Arthritis   . Asthma   . Deficiency of other specified B group vitamins   . Depression   .  Environmental allergies    mold  . Fall   . GERD without esophagitis   . Hyperlipidemia   . Hypertension   . Hypertensive kidney disease with CKD (chronic kidney disease)    with stage I through stage 4 CKD   . Memory loss   . Seasonal allergies   . Unspecified osteoarthritis, unspecified site    Past Surgical History:  Procedure Laterality Date  . HIP ARTHROPLASTY  06/25/2011   Procedure: ARTHROPLASTY BIPOLAR HIP;  Surgeon: Laurice Record Aplington;  Location: [facility];  Service: Orthopedics;  Laterality: Right;  . NASAL SINUS SURGERY    . VAGINAL HYSTERECTOMY      Allergies  Allergen Reactions  . Azithromycin Other (See Comments)    Nausea, weakness   . Aspirin Other (See Comments)    Unknown reaction  . Molds & Smuts   . Septra [Sulfamethoxazole-Trimethoprim]   . Sulfa Antibiotics   . Sulfonamide Derivatives Other (See Comments)    Unknown reaction    Outpatient Encounter Medications as of 11/14/2019  Medication Sig  . acetaminophen (TYLENOL) 500 MG tablet Take 500 mg by mouth at bedtime.  . budesonide (PULMICORT) 0.5 MG/2ML nebulizer solution Take 0.5 mg by nebulization 2 (two) times daily.  . Calcium Carb-Cholecalciferol (CALCIUM 600-D PO) Take 1 tablet by mouth daily.  . calcium carbonate (OSCAL) 1500 (600 Ca) MG TABS tablet Take 600 mg of elemental calcium by mouth daily. Take one capsule daily: Route; ORAL  . Camphor-Menthol-Methyl Sal (SALONPAS) 3.08-08-08 %  PTCH Apply 1 patch topically every 12 (twelve) hours as needed (neck pain).  . Cholecalciferol (VITAMIN D) 2000 units tablet Take 2,000 Units by mouth at bedtime.  Marland Kitchen dextromethorphan (DELSYM) 30 MG/5ML liquid Take 60 mg by mouth every 12 (twelve) hours as needed for cough.  . docusate sodium (COLACE) 100 MG capsule Take 100 mg by mouth at bedtime.   . DUPIXENT 300 MG/2ML SOSY Inject 1 pen/syringe subcutaneously every other week STARTING ON DAY 15  . EPINEPHrine (EPIPEN 2-PAK) 0.3 mg/0.3 mL IJ SOAJ injection Inject 0.3 mg  into the muscle as needed for anaphylaxis. Prescribed with dupexient  . FLUoxetine (PROZAC) 20 MG capsule Take 40 mg by mouth daily.   . fluticasone (FLONASE) 50 MCG/ACT nasal spray Place 2 sprays into both nostrils daily. 2 sprays: nasal Special instruction: administer 2 sprays into nostrils daily once  a morning 8 am and 11 am  . formoterol (PERFOROMIST) 20 MCG/2ML nebulizer solution Take 20 mcg by nebulization 2 (two) times daily.  . furosemide (LASIX) 40 MG tablet Take 40 mg by mouth daily.  Marland Kitchen ipratropium-albuterol (DUONEB) 0.5-2.5 (3) MG/3ML SOLN Take 3 mLs by nebulization every 4 (four) hours as needed.  . memantine (NAMENDA) 10 MG tablet Take 1 tablet (10 mg total) by mouth 2 (two) times daily.  . montelukast (SINGULAIR) 10 MG tablet Take 1 tablet (10 mg total) by mouth daily.  Marland Kitchen olmesartan (BENICAR) 20 MG tablet Take 40 mg by mouth daily.   Marland Kitchen omeprazole (PRILOSEC) 20 MG capsule Take 20 mg by mouth daily.   . ondansetron (ZOFRAN) 4 MG tablet Take 4 mg by mouth every 6 (six) hours as needed for nausea or vomiting.  . polyethylene glycol (MIRALAX / GLYCOLAX) packet Take 17 g by mouth daily.  Marland Kitchen triamcinolone cream (KENALOG) 0.1 % Apply 1 application topically as needed.  . vitamin B-12 (CYANOCOBALAMIN) 1000 MCG tablet Take 3,000 mcg by mouth daily.  . [DISCONTINUED] Liniments (SALONPAS PAIN RELIEF PATCH EX) Place 1 patch onto the skin daily as needed (neck pain (remove patch after 12 hours)).  . [DISCONTINUED] rivaroxaban (XARELTO) 10 MG TABS tablet Take 1 tablet (10 mg total) by mouth daily.  . [DISCONTINUED] spiritus frumenti (ETHYL ALCOHOL) SOLN Take 1 each by mouth See admin instructions. May have 1-2 glasses of wine every evening(4 oz each) for a total of 8 oz daily   No facility-administered encounter medications on file as of 11/14/2019.    Review of Systems  Constitutional: Negative for activity change, appetite change, chills and fever.  HENT: Negative for congestion and sore  throat.   Eyes: Negative for visual disturbance.  Respiratory: Negative for shortness of breath and wheezing.   Cardiovascular: Negative for chest pain and palpitations.  Gastrointestinal: Negative for abdominal pain, constipation, diarrhea, nausea and vomiting.  Genitourinary: Negative for dysuria.  Musculoskeletal: Positive for arthralgias and gait problem.  Skin: Negative for color change.  Neurological: Positive for weakness. Negative for dizziness.  Hematological: Bruises/bleeds easily.  Psychiatric/Behavioral: Positive for confusion. Negative for behavioral problems and sleep disturbance. The patient is not nervous/anxious.     Immunization History  Administered Date(s) Administered  . Influenza Inj Mdck Quad Pf 05/18/2019  . Influenza Split 04/04/2012, 05/03/2013, 05/10/2015  . Influenza Whole 04/18/2008, 06/04/2009, 04/03/2010, 03/30/2011  . Influenza, High Dose Seasonal PF 04/20/2018  . Influenza,inj,Quad PF,6+ Mos 03/26/2017  . Influenza-Unspecified 03/26/2010, 04/13/2012, 05/08/2013, 05/14/2014, 06/03/2014, 05/13/2015, 04/27/2016, 04/29/2016  . Moderna SARS-COVID-2 Vaccination 09/05/2019, 10/03/2019  . Pneumococcal Conjugate-13 03/05/2014  . Pneumococcal  Polysaccharide-23 06/04/2009  . Pneumococcal-Unspecified 03/13/2009  . Td 03/13/2009, 04/13/2012  . Zoster 03/21/2010   Pertinent  Health Maintenance Due  Topic Date Due  . INFLUENZA VACCINE  03/03/2020  . PNA vac Low Risk Adult  Completed  . DEXA SCAN  Discontinued   Fall Risk  12/07/2012  Falls in the past year? Yes  Number falls in past yr: 2 or more  Risk Factor Category  High Fall Risk  Risk for fall due to : Impaired balance/gait   Functional Status Survey:    Vitals:   11/14/19 1414  BP: 113/62  Pulse: 68  Resp: 18  Temp: (!) 97.4 F (36.3 C)  TempSrc: Oral  SpO2: 97%  Weight: 155 lb (70.3 kg)   Body mass index is 28.35 kg/m. Physical Exam Vitals reviewed.  Constitutional:      General: She  is not in acute distress.    Appearance: Normal appearance. She is not toxic-appearing.  Cardiovascular:     Rate and Rhythm: Normal rate and regular rhythm.     Heart sounds: No murmur.  Pulmonary:     Effort: Pulmonary effort is normal.     Breath sounds: Normal breath sounds. No wheezing or rhonchi.  Abdominal:     General: Bowel sounds are normal.     Palpations: Abdomen is soft.     Tenderness: There is no abdominal tenderness.  Musculoskeletal:        General: Normal range of motion.     Right lower leg: Edema present.     Left lower leg: Edema present.  Skin:    General: Skin is warm and dry.     Capillary Refill: Capillary refill takes less than 2 seconds.  Neurological:     General: No focal deficit present.     Mental Status: She is alert. Mental status is at baseline.     Comments: Uses manual wheelchair  Psychiatric:        Mood and Affect: Mood normal.     Labs reviewed: No results for input(s): NA, K, CL, CO2, GLUCOSE, BUN, CREATININE, CALCIUM, MG, PHOS in the last 8760 hours. No results for input(s): AST, ALT, ALKPHOS, BILITOT, PROT, ALBUMIN in the last 8760 hours. No results for input(s): WBC, NEUTROABS, HGB, HCT, MCV, PLT in the last 8760 hours. Lab Results  Component Value Date   TSH 2.440 09/04/2015   No results found for: HGBA1C No results found for: CHOL, HDL, LDLCALC, LDLDIRECT, TRIG, CHOLHDL  Assessment/Plan 1. Late onset Alzheimer's disease without behavioral disturbance ([place]) -continues on namenda for moderate dementia -requires snf care  2. Major depressive disorder with single episode, in full remission ([place]) -remains in remission on prozac  3. Hypertensive kidney disease with CKD (chronic kidney disease) stage V ([place]) -bp controlled with current regimen--cont same and monitor  4. Severe persistent asthma without complication -with allergies and component of vocal cord dysfunction -cont singulair, duonebs, perforomist, dupixent, prn  epipen, pulmicort and delsym cough syrup  5. Gastro-esophageal reflux disease without esophagitis -cont PPI  6. Vocal cord dysfunction -felt to be a contributor historically, but she's not on an anxiolytic for this  Family/ staff Communication: discussed with snf nurse Labs/tests ordered: none  Kerissa Coia L. Rossie Scarfone, D.O. [facility] [address], [place] [address] Cell Phone (Mon-Fri 8am-5pm):  [phone] On Call:  [phone] & follow prompts after 5pm & weekends Office Phone:  [phone] Office Fax:  [phone]

## 2019-11-15 DIAGNOSIS — R293 Abnormal posture: Secondary | ICD-10-CM | POA: Diagnosis not present

## 2019-11-15 DIAGNOSIS — M542 Cervicalgia: Secondary | ICD-10-CM | POA: Diagnosis not present

## 2019-11-15 DIAGNOSIS — M21061 Valgus deformity, not elsewhere classified, right knee: Secondary | ICD-10-CM | POA: Diagnosis not present

## 2019-11-15 DIAGNOSIS — M25561 Pain in right knee: Secondary | ICD-10-CM | POA: Diagnosis not present

## 2019-11-15 DIAGNOSIS — M545 Low back pain: Secondary | ICD-10-CM | POA: Diagnosis not present

## 2019-11-15 DIAGNOSIS — U071 COVID-19: Secondary | ICD-10-CM | POA: Diagnosis not present

## 2019-11-17 DIAGNOSIS — M545 Low back pain: Secondary | ICD-10-CM | POA: Diagnosis not present

## 2019-11-17 DIAGNOSIS — U071 COVID-19: Secondary | ICD-10-CM | POA: Diagnosis not present

## 2019-11-17 DIAGNOSIS — M25561 Pain in right knee: Secondary | ICD-10-CM | POA: Diagnosis not present

## 2019-11-17 DIAGNOSIS — M542 Cervicalgia: Secondary | ICD-10-CM | POA: Diagnosis not present

## 2019-11-17 DIAGNOSIS — M21061 Valgus deformity, not elsewhere classified, right knee: Secondary | ICD-10-CM | POA: Diagnosis not present

## 2019-11-17 DIAGNOSIS — R293 Abnormal posture: Secondary | ICD-10-CM | POA: Diagnosis not present

## 2019-11-20 DIAGNOSIS — M545 Low back pain: Secondary | ICD-10-CM | POA: Diagnosis not present

## 2019-11-20 DIAGNOSIS — R293 Abnormal posture: Secondary | ICD-10-CM | POA: Diagnosis not present

## 2019-11-20 DIAGNOSIS — M21061 Valgus deformity, not elsewhere classified, right knee: Secondary | ICD-10-CM | POA: Diagnosis not present

## 2019-11-20 DIAGNOSIS — M25561 Pain in right knee: Secondary | ICD-10-CM | POA: Diagnosis not present

## 2019-11-20 DIAGNOSIS — M542 Cervicalgia: Secondary | ICD-10-CM | POA: Diagnosis not present

## 2019-11-20 DIAGNOSIS — U071 COVID-19: Secondary | ICD-10-CM | POA: Diagnosis not present

## 2019-11-22 DIAGNOSIS — M542 Cervicalgia: Secondary | ICD-10-CM | POA: Diagnosis not present

## 2019-11-22 DIAGNOSIS — R293 Abnormal posture: Secondary | ICD-10-CM | POA: Diagnosis not present

## 2019-11-22 DIAGNOSIS — M545 Low back pain: Secondary | ICD-10-CM | POA: Diagnosis not present

## 2019-11-22 DIAGNOSIS — U071 COVID-19: Secondary | ICD-10-CM | POA: Diagnosis not present

## 2019-11-22 DIAGNOSIS — M25561 Pain in right knee: Secondary | ICD-10-CM | POA: Diagnosis not present

## 2019-11-22 DIAGNOSIS — M21061 Valgus deformity, not elsewhere classified, right knee: Secondary | ICD-10-CM | POA: Diagnosis not present

## 2019-11-24 DIAGNOSIS — M542 Cervicalgia: Secondary | ICD-10-CM | POA: Diagnosis not present

## 2019-11-24 DIAGNOSIS — M545 Low back pain: Secondary | ICD-10-CM | POA: Diagnosis not present

## 2019-11-24 DIAGNOSIS — R293 Abnormal posture: Secondary | ICD-10-CM | POA: Diagnosis not present

## 2019-11-24 DIAGNOSIS — U071 COVID-19: Secondary | ICD-10-CM | POA: Diagnosis not present

## 2019-11-24 DIAGNOSIS — M25561 Pain in right knee: Secondary | ICD-10-CM | POA: Diagnosis not present

## 2019-11-24 DIAGNOSIS — M21061 Valgus deformity, not elsewhere classified, right knee: Secondary | ICD-10-CM | POA: Diagnosis not present

## 2019-11-28 DIAGNOSIS — M545 Low back pain: Secondary | ICD-10-CM | POA: Diagnosis not present

## 2019-11-28 DIAGNOSIS — M21061 Valgus deformity, not elsewhere classified, right knee: Secondary | ICD-10-CM | POA: Diagnosis not present

## 2019-11-28 DIAGNOSIS — R293 Abnormal posture: Secondary | ICD-10-CM | POA: Diagnosis not present

## 2019-11-28 DIAGNOSIS — U071 COVID-19: Secondary | ICD-10-CM | POA: Diagnosis not present

## 2019-11-28 DIAGNOSIS — M25561 Pain in right knee: Secondary | ICD-10-CM | POA: Diagnosis not present

## 2019-11-28 DIAGNOSIS — M542 Cervicalgia: Secondary | ICD-10-CM | POA: Diagnosis not present

## 2019-11-29 DIAGNOSIS — R293 Abnormal posture: Secondary | ICD-10-CM | POA: Diagnosis not present

## 2019-11-29 DIAGNOSIS — M545 Low back pain: Secondary | ICD-10-CM | POA: Diagnosis not present

## 2019-11-29 DIAGNOSIS — U071 COVID-19: Secondary | ICD-10-CM | POA: Diagnosis not present

## 2019-11-29 DIAGNOSIS — M21061 Valgus deformity, not elsewhere classified, right knee: Secondary | ICD-10-CM | POA: Diagnosis not present

## 2019-11-29 DIAGNOSIS — M542 Cervicalgia: Secondary | ICD-10-CM | POA: Diagnosis not present

## 2019-11-29 DIAGNOSIS — M25561 Pain in right knee: Secondary | ICD-10-CM | POA: Diagnosis not present

## 2019-11-30 DIAGNOSIS — M21061 Valgus deformity, not elsewhere classified, right knee: Secondary | ICD-10-CM | POA: Diagnosis not present

## 2019-11-30 DIAGNOSIS — R293 Abnormal posture: Secondary | ICD-10-CM | POA: Diagnosis not present

## 2019-11-30 DIAGNOSIS — M25561 Pain in right knee: Secondary | ICD-10-CM | POA: Diagnosis not present

## 2019-11-30 DIAGNOSIS — U071 COVID-19: Secondary | ICD-10-CM | POA: Diagnosis not present

## 2019-11-30 DIAGNOSIS — M542 Cervicalgia: Secondary | ICD-10-CM | POA: Diagnosis not present

## 2019-11-30 DIAGNOSIS — M545 Low back pain: Secondary | ICD-10-CM | POA: Diagnosis not present

## 2019-12-04 DIAGNOSIS — U071 COVID-19: Secondary | ICD-10-CM | POA: Diagnosis not present

## 2019-12-04 DIAGNOSIS — M542 Cervicalgia: Secondary | ICD-10-CM | POA: Diagnosis not present

## 2019-12-04 DIAGNOSIS — M21061 Valgus deformity, not elsewhere classified, right knee: Secondary | ICD-10-CM | POA: Diagnosis not present

## 2019-12-04 DIAGNOSIS — M25561 Pain in right knee: Secondary | ICD-10-CM | POA: Diagnosis not present

## 2019-12-04 DIAGNOSIS — G301 Alzheimer's disease with late onset: Secondary | ICD-10-CM | POA: Diagnosis not present

## 2019-12-04 DIAGNOSIS — F0280 Dementia in other diseases classified elsewhere without behavioral disturbance: Secondary | ICD-10-CM | POA: Diagnosis not present

## 2019-12-04 DIAGNOSIS — M1711 Unilateral primary osteoarthritis, right knee: Secondary | ICD-10-CM | POA: Diagnosis not present

## 2019-12-04 DIAGNOSIS — R293 Abnormal posture: Secondary | ICD-10-CM | POA: Diagnosis not present

## 2019-12-04 DIAGNOSIS — M545 Low back pain: Secondary | ICD-10-CM | POA: Diagnosis not present

## 2019-12-06 DIAGNOSIS — U071 COVID-19: Secondary | ICD-10-CM | POA: Diagnosis not present

## 2019-12-06 DIAGNOSIS — M545 Low back pain: Secondary | ICD-10-CM | POA: Diagnosis not present

## 2019-12-06 DIAGNOSIS — M21061 Valgus deformity, not elsewhere classified, right knee: Secondary | ICD-10-CM | POA: Diagnosis not present

## 2019-12-06 DIAGNOSIS — R293 Abnormal posture: Secondary | ICD-10-CM | POA: Diagnosis not present

## 2019-12-06 DIAGNOSIS — M25561 Pain in right knee: Secondary | ICD-10-CM | POA: Diagnosis not present

## 2019-12-06 DIAGNOSIS — M542 Cervicalgia: Secondary | ICD-10-CM | POA: Diagnosis not present

## 2019-12-08 DIAGNOSIS — M545 Low back pain: Secondary | ICD-10-CM | POA: Diagnosis not present

## 2019-12-08 DIAGNOSIS — U071 COVID-19: Secondary | ICD-10-CM | POA: Diagnosis not present

## 2019-12-08 DIAGNOSIS — M21061 Valgus deformity, not elsewhere classified, right knee: Secondary | ICD-10-CM | POA: Diagnosis not present

## 2019-12-08 DIAGNOSIS — M542 Cervicalgia: Secondary | ICD-10-CM | POA: Diagnosis not present

## 2019-12-08 DIAGNOSIS — R293 Abnormal posture: Secondary | ICD-10-CM | POA: Diagnosis not present

## 2019-12-08 DIAGNOSIS — M25561 Pain in right knee: Secondary | ICD-10-CM | POA: Diagnosis not present

## 2019-12-11 DIAGNOSIS — M21061 Valgus deformity, not elsewhere classified, right knee: Secondary | ICD-10-CM | POA: Diagnosis not present

## 2019-12-11 DIAGNOSIS — M542 Cervicalgia: Secondary | ICD-10-CM | POA: Diagnosis not present

## 2019-12-11 DIAGNOSIS — M545 Low back pain: Secondary | ICD-10-CM | POA: Diagnosis not present

## 2019-12-11 DIAGNOSIS — M25561 Pain in right knee: Secondary | ICD-10-CM | POA: Diagnosis not present

## 2019-12-11 DIAGNOSIS — U071 COVID-19: Secondary | ICD-10-CM | POA: Diagnosis not present

## 2019-12-11 DIAGNOSIS — R293 Abnormal posture: Secondary | ICD-10-CM | POA: Diagnosis not present

## 2019-12-13 DIAGNOSIS — M542 Cervicalgia: Secondary | ICD-10-CM | POA: Diagnosis not present

## 2019-12-13 DIAGNOSIS — M21061 Valgus deformity, not elsewhere classified, right knee: Secondary | ICD-10-CM | POA: Diagnosis not present

## 2019-12-13 DIAGNOSIS — M25561 Pain in right knee: Secondary | ICD-10-CM | POA: Diagnosis not present

## 2019-12-13 DIAGNOSIS — U071 COVID-19: Secondary | ICD-10-CM | POA: Diagnosis not present

## 2019-12-13 DIAGNOSIS — R293 Abnormal posture: Secondary | ICD-10-CM | POA: Diagnosis not present

## 2019-12-13 DIAGNOSIS — M545 Low back pain: Secondary | ICD-10-CM | POA: Diagnosis not present

## 2019-12-14 ENCOUNTER — Non-Acute Institutional Stay (SKILLED_NURSING_FACILITY): Payer: Medicare Other | Admitting: Adult Health

## 2019-12-14 ENCOUNTER — Encounter: Payer: Self-pay | Admitting: Adult Health

## 2019-12-14 DIAGNOSIS — M542 Cervicalgia: Secondary | ICD-10-CM | POA: Diagnosis not present

## 2019-12-14 DIAGNOSIS — K219 Gastro-esophageal reflux disease without esophagitis: Secondary | ICD-10-CM | POA: Diagnosis not present

## 2019-12-14 DIAGNOSIS — J4550 Severe persistent asthma, uncomplicated: Secondary | ICD-10-CM

## 2019-12-14 DIAGNOSIS — G301 Alzheimer's disease with late onset: Secondary | ICD-10-CM | POA: Diagnosis not present

## 2019-12-14 DIAGNOSIS — I10 Essential (primary) hypertension: Secondary | ICD-10-CM | POA: Diagnosis not present

## 2019-12-14 DIAGNOSIS — F0280 Dementia in other diseases classified elsewhere without behavioral disturbance: Secondary | ICD-10-CM | POA: Diagnosis not present

## 2019-12-14 DIAGNOSIS — K5901 Slow transit constipation: Secondary | ICD-10-CM

## 2019-12-14 NOTE — Progress Notes (Signed)
Location:  [facility] of Service:  SNF (31) Provider:   Cindi Carbon, ANP [place] [phone]  Gayland Curry, DO  Patient Care Team: Gayland Curry, DO as PCP - General (Geriatric Medicine) Elsie Stain, MD (Pulmonary Disease)  Extended Emergency Contact Information Primary Emergency Contact: Earlene Plater of [place] Phone: [phone] Mobile Phone: [phone] Relation: Daughter  Code Status: DNR Goals of care: Advanced Directive information Advanced Directives 09/11/2019  Does Patient Have a Medical Advance Directive? Yes  Type of Paramedic of [place];Out of facility DNR (pink MOST or yellow form);Living will  Does patient want to make changes to medical advance directive? No - Patient declined  Copy of [place] in Chart? Yes - validated most recent copy scanned in chart (See row information)  Pre-existing out of facility DNR order (yellow form or pink MOST form) Yellow form placed in chart (order not valid for inpatient use)     Chief Complaint  Patient presents with  . Medical Management of Chronic Issues    HPI:  Pt is a 86 y.o. female seen today for medical management of chronic diseases.   She has had some neck pain and stiffness likely related to positioning. She is working with OT and that seems to help. The nurse is requesting that we increase her tylenol.  She has AD and needs assistance with all ADLS. Increased confusion is noted in the afternoon hrs.  Bowels are moving well. Appetite is fair. BP is controlled She has a hx of difficult to control asthma but has been doing quite well for several months. No reports of increased cough or wheezing. PRN duonebs are not used.   Past Medical History:  Diagnosis Date  . Age-related osteoporosis without current pathological fracture   . Alzheimer's disease ([place])    with late onset  .  Arthritis   . Asthma   . Deficiency of other specified B group vitamins   . Depression   . Environmental allergies    mold  . Fall   . GERD without esophagitis   . Hyperlipidemia   . Hypertension   . Hypertensive kidney disease with CKD (chronic kidney disease)    with stage I through stage 4 CKD   . Memory loss   . Seasonal allergies   . Unspecified osteoarthritis, unspecified site    Past Surgical History:  Procedure Laterality Date  . HIP ARTHROPLASTY  06/25/2011   Procedure: ARTHROPLASTY BIPOLAR HIP;  Surgeon: Laurice Record Aplington;  Location: [facility];  Service: Orthopedics;  Laterality: Right;  . NASAL SINUS SURGERY    . VAGINAL HYSTERECTOMY      Allergies  Allergen Reactions  . Azithromycin Other (See Comments)    Nausea, weakness   . Aspirin Other (See Comments)    Unknown reaction  . Molds & Smuts   . Septra [Sulfamethoxazole-Trimethoprim]   . Sulfa Antibiotics   . Sulfonamide Derivatives Other (See Comments)    Unknown reaction    Outpatient Encounter Medications as of 12/14/2019  Medication Sig  . acetaminophen (TYLENOL) 500 MG tablet Take 500 mg by mouth at bedtime.  . budesonide (PULMICORT) 0.5 MG/2ML nebulizer solution Take 0.5 mg by nebulization 2 (two) times daily.  . Calcium Carb-Cholecalciferol (CALCIUM 600-D PO) Take 1 tablet by mouth daily.  . calcium carbonate (OSCAL) 1500 (600 Ca) MG TABS tablet Take 600 mg of elemental calcium by mouth daily. Take one capsule  daily: Route; ORAL  . Camphor-Menthol-Methyl Sal (SALONPAS) 3.08-08-08 % PTCH Apply 1 patch topically every 12 (twelve) hours as needed (neck pain).  . Cholecalciferol (VITAMIN D) 2000 units tablet Take 2,000 Units by mouth at bedtime.  Marland Kitchen dextromethorphan (DELSYM) 30 MG/5ML liquid Take 60 mg by mouth every 12 (twelve) hours as needed for cough.  . docusate sodium (COLACE) 100 MG capsule Take 100 mg by mouth at bedtime.   . DUPIXENT 300 MG/2ML SOSY Inject 1 pen/syringe subcutaneously every other week  STARTING ON DAY 15  . EPINEPHrine (EPIPEN 2-PAK) 0.3 mg/0.3 mL IJ SOAJ injection Inject 0.3 mg into the muscle as needed for anaphylaxis. Prescribed with dupexient  . FLUoxetine (PROZAC) 20 MG capsule Take 40 mg by mouth daily.   . fluticasone (FLONASE) 50 MCG/ACT nasal spray Place 2 sprays into both nostrils daily. 2 sprays: nasal Special instruction: administer 2 sprays into nostrils daily once  a morning 8 am and 11 am  . formoterol (PERFOROMIST) 20 MCG/2ML nebulizer solution Take 20 mcg by nebulization 2 (two) times daily.  . furosemide (LASIX) 40 MG tablet Take 40 mg by mouth daily.  Marland Kitchen ipratropium-albuterol (DUONEB) 0.5-2.5 (3) MG/3ML SOLN Take 3 mLs by nebulization every 4 (four) hours as needed.  . memantine (NAMENDA) 10 MG tablet Take 1 tablet (10 mg total) by mouth 2 (two) times daily.  . montelukast (SINGULAIR) 10 MG tablet Take 1 tablet (10 mg total) by mouth daily.  Marland Kitchen olmesartan (BENICAR) 20 MG tablet Take 40 mg by mouth daily.   Marland Kitchen omeprazole (PRILOSEC) 20 MG capsule Take 20 mg by mouth daily.   . ondansetron (ZOFRAN) 4 MG tablet Take 4 mg by mouth every 6 (six) hours as needed for nausea or vomiting.  . polyethylene glycol (MIRALAX / GLYCOLAX) packet Take 17 g by mouth daily.  Marland Kitchen triamcinolone cream (KENALOG) 0.1 % Apply 1 application topically as needed.  . vitamin B-12 (CYANOCOBALAMIN) 1000 MCG tablet Take 3,000 mcg by mouth daily.  . [DISCONTINUED] rivaroxaban (XARELTO) 10 MG TABS tablet Take 1 tablet (10 mg total) by mouth daily.   No facility-administered encounter medications on file as of 12/14/2019.    Review of Systems  Constitutional: Negative for activity change, appetite change, chills, diaphoresis, fatigue, fever and unexpected weight change.  HENT: Negative for congestion.   Respiratory: Negative for cough, shortness of breath and wheezing.   Cardiovascular: Negative for chest pain, palpitations and leg swelling.  Gastrointestinal: Negative for abdominal distention,  abdominal pain, constipation and diarrhea.  Genitourinary: Negative for difficulty urinating and dysuria.  Musculoskeletal: Positive for gait problem, neck pain and neck stiffness. Negative for arthralgias, back pain, joint swelling and myalgias.  Neurological: Positive for weakness. Negative for dizziness, tremors, seizures, syncope, facial asymmetry, speech difficulty, light-headedness, numbness and headaches.  Psychiatric/Behavioral: Positive for confusion. Negative for agitation and behavioral problems.    Immunization History  Administered Date(s) Administered  . Influenza Inj Mdck Quad Pf 05/18/2019  . Influenza Split 04/04/2012, 05/03/2013, 05/10/2015  . Influenza Whole 04/18/2008, 06/04/2009, 04/03/2010, 03/30/2011  . Influenza, High Dose Seasonal PF 04/20/2018  . Influenza,inj,Quad PF,6+ Mos 03/26/2017  . Influenza-Unspecified 03/26/2010, 04/13/2012, 05/08/2013, 05/14/2014, 06/03/2014, 05/13/2015, 04/27/2016, 04/29/2016  . Moderna SARS-COVID-2 Vaccination 09/05/2019, 10/03/2019  . Pneumococcal Conjugate-13 03/05/2014  . Pneumococcal Polysaccharide-23 06/04/2009  . Pneumococcal-Unspecified 03/13/2009  . Td 03/13/2009, 04/13/2012  . Zoster 03/21/2010   Pertinent  Health Maintenance Due  Topic Date Due  . INFLUENZA VACCINE  03/03/2020  . PNA vac Low Risk Adult  Completed  . DEXA SCAN  Discontinued   Fall Risk  12/07/2012  Falls in the past year? Yes  Number falls in past yr: 2 or more  Risk Factor Category  High Fall Risk  Risk for fall due to : Impaired balance/gait   Functional Status Survey:    Vitals:   12/14/19 1645  Weight: 155 lb 9.6 oz (70.6 kg)   Body mass index is 28.46 kg/m. Physical Exam Vitals and nursing note reviewed.  Neck:     Vascular: No carotid bruit.     Comments: Decreased ROM of neck due to arthritic changes. No pain with ROM, swelling or deformity.  Musculoskeletal:     Cervical back: No rigidity or tenderness.     Right lower leg: No  edema.     Left lower leg: No edema.  Lymphadenopathy:     Cervical: No cervical adenopathy.  Neurological:     General: No focal deficit present.     Mental Status: She is alert. Mental status is at baseline.     Labs reviewed: No results for input(s): NA, K, CL, CO2, GLUCOSE, BUN, CREATININE, CALCIUM, MG, PHOS in the last 8760 hours. No results for input(s): AST, ALT, ALKPHOS, BILITOT, PROT, ALBUMIN in the last 8760 hours. No results for input(s): WBC, NEUTROABS, HGB, HCT, MCV, PLT in the last 8760 hours. Lab Results  Component Value Date   TSH 2.440 09/04/2015   No results found for: HGBA1C No results found for: CHOL, HDL, LDLCALC, LDLDIRECT, TRIG, CHOLHDL  Significant Diagnostic Results in last 30 days:  No results found.  Assessment/Plan 1. Neck pain Continues to work with OT to provide exercises, stretches and positioning techniques Increase tylenol 500 mg tid   2. Essential hypertension Controlled Continue Benicar 40 mg qd   3. Severe persistent asthma without complication No acute exacerbations. Followed by Pulmonary. Doing well on Dupixent and Pulmicort.  4. Gastro-esophageal reflux disease without esophagitis Continue Prilosec 20 mg qd  5. Slow transit constipation Regular bowel movements reported Continue Colace   6. Late onset Alzheimer's disease without behavioral disturbance ([place]) MMSE 22/30 07/13/19 Moderate to severe, continues on Namenda 10 mg bid      Family/ staff Communication: nurse  Labs/tests ordered:   CBC BMP

## 2019-12-15 DIAGNOSIS — M21061 Valgus deformity, not elsewhere classified, right knee: Secondary | ICD-10-CM | POA: Diagnosis not present

## 2019-12-15 DIAGNOSIS — M545 Low back pain: Secondary | ICD-10-CM | POA: Diagnosis not present

## 2019-12-15 DIAGNOSIS — M542 Cervicalgia: Secondary | ICD-10-CM | POA: Diagnosis not present

## 2019-12-15 DIAGNOSIS — U071 COVID-19: Secondary | ICD-10-CM | POA: Diagnosis not present

## 2019-12-15 DIAGNOSIS — M25561 Pain in right knee: Secondary | ICD-10-CM | POA: Diagnosis not present

## 2019-12-15 DIAGNOSIS — R293 Abnormal posture: Secondary | ICD-10-CM | POA: Diagnosis not present

## 2019-12-18 DIAGNOSIS — I129 Hypertensive chronic kidney disease with stage 1 through stage 4 chronic kidney disease, or unspecified chronic kidney disease: Secondary | ICD-10-CM | POA: Diagnosis not present

## 2019-12-18 DIAGNOSIS — D649 Anemia, unspecified: Secondary | ICD-10-CM | POA: Diagnosis not present

## 2019-12-18 DIAGNOSIS — E559 Vitamin D deficiency, unspecified: Secondary | ICD-10-CM | POA: Diagnosis not present

## 2019-12-18 DIAGNOSIS — E876 Hypokalemia: Secondary | ICD-10-CM | POA: Diagnosis not present

## 2019-12-18 LAB — CBC AND DIFFERENTIAL
HCT: 33 — AB (ref 36–46)
Hemoglobin: 11 — AB (ref 12.0–16.0)
Platelets: 245 (ref 150–399)
WBC: 9.1

## 2019-12-18 LAB — COMPREHENSIVE METABOLIC PANEL: Calcium: 9.6 (ref 8.7–10.7)

## 2019-12-18 LAB — BASIC METABOLIC PANEL
BUN: 24 — AB (ref 4–21)
CO2: 22 (ref 13–22)
Chloride: 98 — AB (ref 99–108)
Creatinine: 0.9 (ref 0.5–1.1)
Glucose: 89
Potassium: 4.4 (ref 3.4–5.3)
Sodium: 134 — AB (ref 137–147)

## 2019-12-18 LAB — CBC: RBC: 3.65 — AB (ref 3.87–5.11)

## 2019-12-19 DIAGNOSIS — U071 COVID-19: Secondary | ICD-10-CM | POA: Diagnosis not present

## 2019-12-19 DIAGNOSIS — R293 Abnormal posture: Secondary | ICD-10-CM | POA: Diagnosis not present

## 2019-12-19 DIAGNOSIS — M21061 Valgus deformity, not elsewhere classified, right knee: Secondary | ICD-10-CM | POA: Diagnosis not present

## 2019-12-19 DIAGNOSIS — M542 Cervicalgia: Secondary | ICD-10-CM | POA: Diagnosis not present

## 2019-12-19 DIAGNOSIS — M25561 Pain in right knee: Secondary | ICD-10-CM | POA: Diagnosis not present

## 2019-12-19 DIAGNOSIS — M545 Low back pain: Secondary | ICD-10-CM | POA: Diagnosis not present

## 2019-12-20 ENCOUNTER — Encounter: Payer: Self-pay | Admitting: Internal Medicine

## 2019-12-20 DIAGNOSIS — R293 Abnormal posture: Secondary | ICD-10-CM | POA: Diagnosis not present

## 2019-12-20 DIAGNOSIS — M21061 Valgus deformity, not elsewhere classified, right knee: Secondary | ICD-10-CM | POA: Diagnosis not present

## 2019-12-20 DIAGNOSIS — M545 Low back pain: Secondary | ICD-10-CM | POA: Diagnosis not present

## 2019-12-20 DIAGNOSIS — M25561 Pain in right knee: Secondary | ICD-10-CM | POA: Diagnosis not present

## 2019-12-20 DIAGNOSIS — M542 Cervicalgia: Secondary | ICD-10-CM | POA: Diagnosis not present

## 2019-12-20 DIAGNOSIS — U071 COVID-19: Secondary | ICD-10-CM | POA: Diagnosis not present

## 2019-12-21 DIAGNOSIS — M545 Low back pain: Secondary | ICD-10-CM | POA: Diagnosis not present

## 2019-12-21 DIAGNOSIS — M21061 Valgus deformity, not elsewhere classified, right knee: Secondary | ICD-10-CM | POA: Diagnosis not present

## 2019-12-21 DIAGNOSIS — M542 Cervicalgia: Secondary | ICD-10-CM | POA: Diagnosis not present

## 2019-12-21 DIAGNOSIS — M25561 Pain in right knee: Secondary | ICD-10-CM | POA: Diagnosis not present

## 2019-12-21 DIAGNOSIS — U071 COVID-19: Secondary | ICD-10-CM | POA: Diagnosis not present

## 2019-12-21 DIAGNOSIS — R293 Abnormal posture: Secondary | ICD-10-CM | POA: Diagnosis not present

## 2019-12-25 DIAGNOSIS — M542 Cervicalgia: Secondary | ICD-10-CM | POA: Diagnosis not present

## 2019-12-25 DIAGNOSIS — M25561 Pain in right knee: Secondary | ICD-10-CM | POA: Diagnosis not present

## 2019-12-25 DIAGNOSIS — M21061 Valgus deformity, not elsewhere classified, right knee: Secondary | ICD-10-CM | POA: Diagnosis not present

## 2019-12-25 DIAGNOSIS — U071 COVID-19: Secondary | ICD-10-CM | POA: Diagnosis not present

## 2019-12-25 DIAGNOSIS — R293 Abnormal posture: Secondary | ICD-10-CM | POA: Diagnosis not present

## 2019-12-25 DIAGNOSIS — M545 Low back pain: Secondary | ICD-10-CM | POA: Diagnosis not present

## 2019-12-26 DIAGNOSIS — U071 COVID-19: Secondary | ICD-10-CM | POA: Diagnosis not present

## 2019-12-26 DIAGNOSIS — R293 Abnormal posture: Secondary | ICD-10-CM | POA: Diagnosis not present

## 2019-12-26 DIAGNOSIS — M25561 Pain in right knee: Secondary | ICD-10-CM | POA: Diagnosis not present

## 2019-12-26 DIAGNOSIS — M21061 Valgus deformity, not elsewhere classified, right knee: Secondary | ICD-10-CM | POA: Diagnosis not present

## 2019-12-26 DIAGNOSIS — M542 Cervicalgia: Secondary | ICD-10-CM | POA: Diagnosis not present

## 2019-12-26 DIAGNOSIS — M545 Low back pain: Secondary | ICD-10-CM | POA: Diagnosis not present

## 2019-12-27 DIAGNOSIS — M545 Low back pain: Secondary | ICD-10-CM | POA: Diagnosis not present

## 2019-12-27 DIAGNOSIS — M542 Cervicalgia: Secondary | ICD-10-CM | POA: Diagnosis not present

## 2019-12-27 DIAGNOSIS — U071 COVID-19: Secondary | ICD-10-CM | POA: Diagnosis not present

## 2019-12-27 DIAGNOSIS — M21061 Valgus deformity, not elsewhere classified, right knee: Secondary | ICD-10-CM | POA: Diagnosis not present

## 2019-12-27 DIAGNOSIS — M25561 Pain in right knee: Secondary | ICD-10-CM | POA: Diagnosis not present

## 2019-12-27 DIAGNOSIS — R293 Abnormal posture: Secondary | ICD-10-CM | POA: Diagnosis not present

## 2020-01-11 ENCOUNTER — Non-Acute Institutional Stay (SKILLED_NURSING_FACILITY): Payer: Medicare Other | Admitting: Adult Health

## 2020-01-11 ENCOUNTER — Encounter: Payer: Self-pay | Admitting: Adult Health

## 2020-01-11 DIAGNOSIS — N183 Chronic kidney disease, stage 3 unspecified: Secondary | ICD-10-CM

## 2020-01-11 DIAGNOSIS — R600 Localized edema: Secondary | ICD-10-CM | POA: Diagnosis not present

## 2020-01-11 DIAGNOSIS — I10 Essential (primary) hypertension: Secondary | ICD-10-CM | POA: Diagnosis not present

## 2020-01-11 DIAGNOSIS — J302 Other seasonal allergic rhinitis: Secondary | ICD-10-CM | POA: Diagnosis not present

## 2020-01-11 DIAGNOSIS — F0280 Dementia in other diseases classified elsewhere without behavioral disturbance: Secondary | ICD-10-CM

## 2020-01-11 DIAGNOSIS — G301 Alzheimer's disease with late onset: Secondary | ICD-10-CM

## 2020-01-11 DIAGNOSIS — M542 Cervicalgia: Secondary | ICD-10-CM | POA: Diagnosis not present

## 2020-01-11 NOTE — Progress Notes (Signed)
Location:  [facility] of Service:  SNF (31) Provider:   Cindi Carbon, ANP [place] [phone]  Gayland Curry, DO  Patient Care Team: Gayland Curry, DO as PCP - General (Geriatric Medicine) Elsie Stain, MD (Pulmonary Disease)  Extended Emergency Contact Information Primary Emergency Contact: Earlene Plater of [place] Phone: [phone] Mobile Phone: [phone] Relation: Daughter  Code Status: DNR Goals of care: Advanced Directive information Advanced Directives 09/11/2019  Does Patient Have a Medical Advance Directive? Yes  Type of Paramedic of [place];Out of facility DNR (pink MOST or yellow form);Living will  Does patient want to make changes to medical advance directive? No - Patient declined  Copy of [place] in Chart? Yes - validated most recent copy scanned in chart (See row information)  Pre-existing out of facility DNR order (yellow form or pink MOST form) Yellow form placed in chart (order not valid for inpatient use)     Chief Complaint  Patient presents with  . Medical Management of Chronic Issues    HPI:  Pt is a 86 y.o. female seen today for medical management of chronic diseases.   AD: the nurse reports she is more confused over time and seems to be disoriented in the afternoon and paranoid that there is something wrong with her family  She continues to work with OT due to mild neck pain and stiffness. She is currently taking  salonpas patch and tylenol which helps. She has a hx of difficult to control asthma. At this time she denies any sob, wheeze, cough ,and has no prn use of duoneb in the past 10 days. Her weight and vitals are stable. No pnd, increased edema, doe, or cp.  Wt Readings from Last 3 Encounters:  01/11/20 156 lb 6.4 oz (70.9 kg)  12/14/19 155 lb 9.6 oz (70.6 kg)  11/14/19 155 lb (70.3 kg)   The therapy dept recently  recommended that she be a hoyer lift for morning transfers due to weakness.   Past Medical History:  Diagnosis Date  . Age-related osteoporosis without current pathological fracture   . Alzheimer's disease ([place])    with late onset  . Arthritis   . Asthma   . Deficiency of other specified B group vitamins   . Depression   . Environmental allergies    mold  . Fall   . GERD without esophagitis   . Hyperlipidemia   . Hypertension   . Hypertensive kidney disease with CKD (chronic kidney disease)    with stage I through stage 4 CKD   . Memory loss   . Seasonal allergies   . Unspecified osteoarthritis, unspecified site    Past Surgical History:  Procedure Laterality Date  . HIP ARTHROPLASTY  06/25/2011   Procedure: ARTHROPLASTY BIPOLAR HIP;  Surgeon: Laurice Record Aplington;  Location: [facility];  Service: Orthopedics;  Laterality: Right;  . NASAL SINUS SURGERY    . VAGINAL HYSTERECTOMY      Allergies  Allergen Reactions  . Azithromycin Other (See Comments)    Nausea, weakness   . Aspirin Other (See Comments)    Unknown reaction  . Molds & Smuts   . Septra [Sulfamethoxazole-Trimethoprim]   . Sulfa Antibiotics   . Sulfonamide Derivatives Other (See Comments)    Unknown reaction    Outpatient Encounter Medications as of 01/11/2020  Medication Sig  . acetaminophen (TYLENOL) 500 MG tablet Take 500 mg by mouth in  the morning, at noon, and at bedtime.   . budesonide (PULMICORT) 0.5 MG/2ML nebulizer solution Take 0.5 mg by nebulization 2 (two) times daily.  . Calcium Carb-Cholecalciferol (CALCIUM 600-D PO) Take 1 tablet by mouth daily.  . Cholecalciferol (VITAMIN D) 2000 units tablet Take 2,000 Units by mouth at bedtime.  Marland Kitchen dextromethorphan (DELSYM) 30 MG/5ML liquid Take 60 mg by mouth every 12 (twelve) hours as needed for cough.  . docusate sodium (COLACE) 100 MG capsule Take 100 mg by mouth at bedtime.   . DUPIXENT 300 MG/2ML SOSY Inject 1 pen/syringe subcutaneously every other week  STARTING ON DAY 15  . EPINEPHrine (EPIPEN 2-PAK) 0.3 mg/0.3 mL IJ SOAJ injection Inject 0.3 mg into the muscle as needed for anaphylaxis. Prescribed with dupexient  . FLUoxetine (PROZAC) 20 MG capsule Take 40 mg by mouth daily.   . fluticasone (FLONASE) 50 MCG/ACT nasal spray Place 2 sprays into both nostrils daily. 2 sprays: nasal Special instruction: administer 2 sprays into nostrils daily once  a morning 8 am and 11 am  . formoterol (PERFOROMIST) 20 MCG/2ML nebulizer solution Take 20 mcg by nebulization 2 (two) times daily.  . furosemide (LASIX) 40 MG tablet Take 40 mg by mouth daily.  Marland Kitchen ipratropium-albuterol (DUONEB) 0.5-2.5 (3) MG/3ML SOLN Take 3 mLs by nebulization every 4 (four) hours as needed.  . memantine (NAMENDA) 10 MG tablet Take 1 tablet (10 mg total) by mouth 2 (two) times daily.  . montelukast (SINGULAIR) 10 MG tablet Take 1 tablet (10 mg total) by mouth daily.  Marland Kitchen olmesartan (BENICAR) 20 MG tablet Take 40 mg by mouth daily.   Marland Kitchen omeprazole (PRILOSEC) 20 MG capsule Take 20 mg by mouth daily.   . ondansetron (ZOFRAN) 4 MG tablet Take 4 mg by mouth every 6 (six) hours as needed for nausea or vomiting.  . polyethylene glycol (MIRALAX / GLYCOLAX) packet Take 17 g by mouth daily.  Marland Kitchen triamcinolone cream (KENALOG) 0.1 % Apply 1 application topically as needed.  . vitamin B-12 (CYANOCOBALAMIN) 1000 MCG tablet Take 3,000 mcg by mouth daily.  . [DISCONTINUED] calcium carbonate (OSCAL) 1500 (600 Ca) MG TABS tablet Take 600 mg of elemental calcium by mouth daily. Take one capsule daily: Route; ORAL  . [DISCONTINUED] Camphor-Menthol-Methyl Sal (SALONPAS) 3.08-08-08 % PTCH Apply 1 patch topically every 12 (twelve) hours as needed (neck pain).  . [DISCONTINUED] rivaroxaban (XARELTO) 10 MG TABS tablet Take 1 tablet (10 mg total) by mouth daily.   No facility-administered encounter medications on file as of 01/11/2020.    Review of Systems  Constitutional: Negative for activity change, appetite  change, chills, diaphoresis, fatigue, fever and unexpected weight change.  HENT: Negative for congestion.   Respiratory: Negative for cough, shortness of breath and wheezing.   Cardiovascular: Negative for chest pain, palpitations and leg swelling.  Gastrointestinal: Negative for abdominal distention, abdominal pain, constipation and diarrhea.  Genitourinary: Negative for difficulty urinating and dysuria.  Musculoskeletal: Positive for gait problem, neck pain and neck stiffness. Negative for arthralgias, back pain, joint swelling and myalgias.  Neurological: Positive for weakness. Negative for dizziness, tremors, seizures, syncope, facial asymmetry, speech difficulty, light-headedness, numbness and headaches.  Psychiatric/Behavioral: Positive for confusion. Negative for agitation and behavioral problems.    Immunization History  Administered Date(s) Administered  . Influenza Inj Mdck Quad Pf 05/18/2019  . Influenza Split 04/04/2012, 05/03/2013, 05/10/2015  . Influenza Whole 04/18/2008, 06/04/2009, 04/03/2010, 03/30/2011  . Influenza, High Dose Seasonal PF 04/20/2018  . Influenza,inj,Quad PF,6+ Mos 03/26/2017  . Influenza-Unspecified  03/26/2010, 04/13/2012, 05/08/2013, 05/14/2014, 06/03/2014, 05/13/2015, 04/27/2016, 04/29/2016  . Moderna SARS-COVID-2 Vaccination 09/05/2019, 10/03/2019  . Pneumococcal Conjugate-13 03/05/2014  . Pneumococcal Polysaccharide-23 06/04/2009  . Pneumococcal-Unspecified 03/13/2009  . Td 03/13/2009, 04/13/2012  . Zoster 03/21/2010   Pertinent  Health Maintenance Due  Topic Date Due  . INFLUENZA VACCINE  03/03/2020  . PNA vac Low Risk Adult  Completed  . DEXA SCAN  Discontinued   Fall Risk  01/11/2020 12/07/2012  Falls in the past year? 0 Yes  Number falls in past yr: 0 2 or more  Injury with Fall? 0 -  Risk Factor Category  - High Fall Risk  Risk for fall due to : - Impaired balance/gait   Functional Status Survey:    Vitals:   01/11/20 0951  Weight:  156 lb 6.4 oz (70.9 kg)   Body mass index is 28.61 kg/m.  Wt Readings from Last 3 Encounters:  01/11/20 156 lb 6.4 oz (70.9 kg)  12/14/19 155 lb 9.6 oz (70.6 kg)  11/14/19 155 lb (70.3 kg)   1  Physical Exam Vitals and nursing note reviewed.  HENT:     Head: Normocephalic and atraumatic.  Neck:     Vascular: No carotid bruit.     Comments: Decreased ROM of neck due to arthritic changes. No pain with ROM, swelling or deformity.  Cardiovascular:     Rate and Rhythm: Normal rate and regular rhythm.  Pulmonary:     Effort: Pulmonary effort is normal.     Breath sounds: Normal breath sounds.  Abdominal:     General: Bowel sounds are normal. There is no distension.     Palpations: Abdomen is soft.  Musculoskeletal:     Cervical back: No rigidity or tenderness.     Right lower leg: No edema.     Left lower leg: No edema.  Lymphadenopathy:     Cervical: No cervical adenopathy.  Neurological:     General: No focal deficit present.     Mental Status: She is alert. Mental status is at baseline.  Psychiatric:     Comments: Asking what is wrong with her, slightly anxious     Labs reviewed: Recent Labs    12/18/19 0700  NA 134*  K 4.4  CL 98*  CO2 22  BUN 24*  CREATININE 0.9  CALCIUM 9.6   No results for input(s): AST, ALT, ALKPHOS, BILITOT, PROT, ALBUMIN in the last 8760 hours. Recent Labs    12/18/19 0700  WBC 9.1  HGB 11.0*  HCT 33*  PLT 245   Lab Results  Component Value Date   TSH 2.440 09/04/2015   No results found for: HGBA1C No results found for: CHOL, HDL, LDLCALC, LDLDIRECT, TRIG, CHOLHDL  Significant Diagnostic Results in last 30 days:  No results found.  Assessment/Plan 1. Late onset Alzheimer's disease without behavioral disturbance (HCC) Worsening, needs more cuing and frequent comfort/reminders Continue Namenda 10 mg bid   2. Stage 3 chronic kidney disease, unspecified whether stage 3a or 3b CKD Hydrate Continue to periodically monitor  BMP and avoid nephrotoxic agents  3. Seasonal allergic rhinitis, unspecified trigger No new issues, continue fluticasone  4. Neck pain Continue Tylenol 500 mg tid and salonpas patch. If worsens would recommended imaging although given her advanced age no further intervention would likely be warranted (reviewed CT of neck in epic)  5. Localized edema Controlled with scheduled lasix 40 mg qd. BMP reviewed   6. Essential hypertension Controlled. Continue Benicar 40 mg qd  7. Severe asthma Followed by pulmonary Doing well with no acute exacerbations with pulmicort, formoterol, and Dupexent      Family/ staff Communication: nurse  Labs/tests ordered:   NA

## 2020-01-26 ENCOUNTER — Non-Acute Institutional Stay (SKILLED_NURSING_FACILITY): Payer: Medicare Other | Admitting: Adult Health

## 2020-01-26 ENCOUNTER — Encounter: Payer: Self-pay | Admitting: Adult Health

## 2020-01-26 DIAGNOSIS — F02818 Dementia in other diseases classified elsewhere, unspecified severity, with other behavioral disturbance: Secondary | ICD-10-CM

## 2020-01-26 DIAGNOSIS — G301 Alzheimer's disease with late onset: Secondary | ICD-10-CM | POA: Diagnosis not present

## 2020-01-26 DIAGNOSIS — F0281 Dementia in other diseases classified elsewhere with behavioral disturbance: Secondary | ICD-10-CM | POA: Diagnosis not present

## 2020-01-26 NOTE — Progress Notes (Signed)
Location:  [facility] of Service:  SNF (31) Provider:   Cindi Carbon, ANP [place] [phone]   Gayland Curry, DO  Patient Care Team: Gayland Curry, DO as PCP - General (Geriatric Medicine) Elsie Stain, MD (Pulmonary Disease)  Extended Emergency Contact Information Primary Emergency Contact: Earlene Plater of [place] Phone: [phone] Mobile Phone: [phone] Relation: Daughter  Code Status:  DNR Goals of care: Advanced Directive information Advanced Directives 09/11/2019  Does Patient Have a Medical Advance Directive? Yes  Type of Paramedic of [place];Out of facility DNR (pink MOST or yellow form);Living will  Does patient want to make changes to medical advance directive? No - Patient declined  Copy of [place] in Chart? Yes - validated most recent copy scanned in chart (See row information)  Pre-existing out of facility DNR order (yellow form or pink MOST form) Yellow form placed in chart (order not valid for inpatient use)     Chief Complaint  Patient presents with  . Acute Visit    anxiety/delusions    HPI:  Pt is a 86 y.o. female seen today for an acute visit for anxiety and delusions. Ms. Hulett has a hx of AD. Recently she has become more confused She thinks she is supposed to eat in the main dining room and can be persistent about this. She becomes increasingly confused in the afternoon and asks for her husband. She can refuse to eat because she thinks she is not supposed to eat in skilled care. She need increased prompting and cuing in the skilled are setting. Labs reviewed are WNL. Vitals are also within acceptable range. Her daughter has requested medication prn for this issue.    Past Medical History:  Diagnosis Date  . Age-related osteoporosis without current pathological fracture   . Alzheimer's disease ([place])    with late  onset  . Arthritis   . Asthma   . Deficiency of other specified B group vitamins   . Depression   . Environmental allergies    mold  . Fall   . GERD without esophagitis   . Hyperlipidemia   . Hypertension   . Hypertensive kidney disease with CKD (chronic kidney disease)    with stage I through stage 4 CKD   . Memory loss   . Seasonal allergies   . Unspecified osteoarthritis, unspecified site    Past Surgical History:  Procedure Laterality Date  . HIP ARTHROPLASTY  06/25/2011   Procedure: ARTHROPLASTY BIPOLAR HIP;  Surgeon: Laurice Record [place];  Location: [facility];  Service: Orthopedics;  Laterality: Right;  . NASAL SINUS SURGERY    . VAGINAL HYSTERECTOMY      Allergies  Allergen Reactions  . Azithromycin Other (See Comments)    Nausea, weakness   . Aspirin Other (See Comments)    Unknown reaction  . Molds & Smuts   . Septra [Sulfamethoxazole-Trimethoprim]   . Sulfa Antibiotics   . Sulfonamide Derivatives Other (See Comments)    Unknown reaction    Outpatient Encounter Medications as of 01/26/2020  Medication Sig  . acetaminophen (TYLENOL) 500 MG tablet Take 500 mg by mouth in the morning, at noon, and at bedtime.   . budesonide (PULMICORT) 0.5 MG/2ML nebulizer solution Take 0.5 mg by nebulization 2 (two) times daily.  . Calcium Carb-Cholecalciferol (CALCIUM 600-D PO) Take 1 tablet by mouth daily.  . Cholecalciferol (VITAMIN D) 2000 units tablet Take 2,000 Units by  mouth at bedtime.  Marland Kitchen dextromethorphan (DELSYM) 30 MG/5ML liquid Take 60 mg by mouth every 12 (twelve) hours as needed for cough.  . docusate sodium (COLACE) 100 MG capsule Take 100 mg by mouth at bedtime.   . DUPIXENT 300 MG/2ML SOSY Inject 1 pen/syringe subcutaneously every other week STARTING ON DAY 15  . EPINEPHrine (EPIPEN 2-PAK) 0.3 mg/0.3 mL IJ SOAJ injection Inject 0.3 mg into the muscle as needed for anaphylaxis. Prescribed with dupexient  . FLUoxetine (PROZAC) 20 MG capsule Take 40 mg by mouth daily.   .  fluticasone (FLONASE) 50 MCG/ACT nasal spray Place 2 sprays into both nostrils daily. 2 sprays: nasal Special instruction: administer 2 sprays into nostrils daily once  a morning 8 am and 11 am  . formoterol (PERFOROMIST) 20 MCG/2ML nebulizer solution Take 20 mcg by nebulization 2 (two) times daily.  . furosemide (LASIX) 40 MG tablet Take 40 mg by mouth daily.  Marland Kitchen ipratropium-albuterol (DUONEB) 0.5-2.5 (3) MG/3ML SOLN Take 3 mLs by nebulization every 4 (four) hours as needed.  . memantine (NAMENDA) 10 MG tablet Take 1 tablet (10 mg total) by mouth 2 (two) times daily.  . montelukast (SINGULAIR) 10 MG tablet Take 1 tablet (10 mg total) by mouth daily.  Marland Kitchen olmesartan (BENICAR) 20 MG tablet Take 40 mg by mouth daily.   Marland Kitchen omeprazole (PRILOSEC) 20 MG capsule Take 20 mg by mouth daily.   . ondansetron (ZOFRAN) 4 MG tablet Take 4 mg by mouth every 6 (six) hours as needed for nausea or vomiting.  . polyethylene glycol (MIRALAX / GLYCOLAX) packet Take 17 g by mouth daily.  Marland Kitchen triamcinolone cream (KENALOG) 0.1 % Apply 1 application topically as needed.  . vitamin B-12 (CYANOCOBALAMIN) 1000 MCG tablet Take 3,000 mcg by mouth daily.  . [DISCONTINUED] rivaroxaban (XARELTO) 10 MG TABS tablet Take 1 tablet (10 mg total) by mouth daily.   No facility-administered encounter medications on file as of 01/26/2020.    Review of Systems  Constitutional: Negative for activity change, appetite change, chills, diaphoresis, fatigue, fever and unexpected weight change.  HENT: Negative for congestion.   Respiratory: Negative for cough, shortness of breath and wheezing.   Cardiovascular: Negative for chest pain, palpitations and leg swelling.  Gastrointestinal: Negative for abdominal distention, abdominal pain, constipation and diarrhea.  Genitourinary: Negative for difficulty urinating and dysuria.  Musculoskeletal: Positive for gait problem and neck pain. Negative for arthralgias, back pain, joint swelling and myalgias.    Neurological: Negative for dizziness, tremors, seizures, syncope, facial asymmetry, speech difficulty, weakness, light-headedness, numbness and headaches.  Psychiatric/Behavioral: Positive for agitation, behavioral problems and confusion. Negative for self-injury and suicidal ideas. The patient is nervous/anxious.     Immunization History  Administered Date(s) Administered  . Influenza Inj Mdck Quad Pf 05/18/2019  . Influenza Split 04/04/2012, 05/03/2013, 05/10/2015  . Influenza Whole 04/18/2008, 06/04/2009, 04/03/2010, 03/30/2011  . Influenza, High Dose Seasonal PF 04/20/2018  . Influenza,inj,Quad PF,6+ Mos 03/26/2017  . Influenza-Unspecified 03/26/2010, 04/13/2012, 05/08/2013, 05/14/2014, 06/03/2014, 05/13/2015, 04/27/2016, 04/29/2016  . Moderna SARS-COVID-2 Vaccination 09/05/2019, 10/03/2019  . Pneumococcal Conjugate-13 03/05/2014  . Pneumococcal Polysaccharide-23 06/04/2009  . Pneumococcal-Unspecified 03/13/2009  . Td 03/13/2009, 04/13/2012  . Zoster 03/21/2010   Pertinent  Health Maintenance Due  Topic Date Due  . INFLUENZA VACCINE  03/03/2020  . PNA vac Low Risk Adult  Completed  . DEXA SCAN  Discontinued   Fall Risk  01/11/2020 12/07/2012  Falls in the past year? 0 Yes  Number falls in past yr: 0  2 or more  Injury with Fall? 0 -  Risk Factor Category  - High Fall Risk  Risk for fall due to : - Impaired balance/gait   Functional Status Survey:    There were no vitals filed for this visit. There is no height or weight on file to calculate BMI. Physical Exam Vitals and nursing note reviewed.  Constitutional:      General: She is not in acute distress.    Appearance: She is not diaphoretic.  HENT:     Head: Normocephalic and atraumatic.  Neck:     Vascular: No JVD.  Cardiovascular:     Rate and Rhythm: Normal rate and regular rhythm.     Heart sounds: No murmur heard.   Pulmonary:     Effort: Pulmonary effort is normal. No respiratory distress.     Breath sounds:  Wheezing present.  Abdominal:     General: Bowel sounds are normal. There is no distension.     Palpations: Abdomen is soft.  Skin:    General: Skin is warm and dry.  Neurological:     General: No focal deficit present.     Mental Status: She is alert. Mental status is at baseline.     Comments: Oriented x 2  Psychiatric:        Mood and Affect: Mood normal.     Labs reviewed: Recent Labs    12/18/19 0700  NA 134*  K 4.4  CL 98*  CO2 22  BUN 24*  CREATININE 0.9  CALCIUM 9.6   No results for input(s): AST, ALT, ALKPHOS, BILITOT, PROT, ALBUMIN in the last 8760 hours. Recent Labs    12/18/19 0700  WBC 9.1  HGB 11.0*  HCT 33*  PLT 245   Lab Results  Component Value Date   TSH 2.440 09/04/2015   No results found for: HGBA1C No results found for: CHOL, HDL, LDLCALC, LDLDIRECT, TRIG, CHOLHDL  Significant Diagnostic Results in last 30 days:  No results found.  Assessment/Plan 1. Late onset Alzheimer's disease with behavioral disturbance (HCC) Vistaril 12.5 mg bid as needed x 14 days, if no improvement may try xanax. If this continues on a more regular basis neurontin may be a good option to be given each afternoon as the patient also has chronic neck pain.  Ms. Claudia Desanctis would like the staff to encourage Ms. Lezcano if she becomes anxious in a way that is not "telling her what to do" as she becomes resistant to this approach. I discussed this concern with her nurse Misti.   Family/ staff Communication: discussed with her daughter Ms. [place] (20 min conversation)  Labs/tests ordered:  NA

## 2020-02-05 DIAGNOSIS — U071 COVID-19: Secondary | ICD-10-CM | POA: Diagnosis not present

## 2020-02-05 DIAGNOSIS — M545 Low back pain: Secondary | ICD-10-CM | POA: Diagnosis not present

## 2020-02-05 DIAGNOSIS — M1711 Unilateral primary osteoarthritis, right knee: Secondary | ICD-10-CM | POA: Diagnosis not present

## 2020-02-05 DIAGNOSIS — M21061 Valgus deformity, not elsewhere classified, right knee: Secondary | ICD-10-CM | POA: Diagnosis not present

## 2020-02-05 DIAGNOSIS — F0280 Dementia in other diseases classified elsewhere without behavioral disturbance: Secondary | ICD-10-CM | POA: Diagnosis not present

## 2020-02-05 DIAGNOSIS — R293 Abnormal posture: Secondary | ICD-10-CM | POA: Diagnosis not present

## 2020-02-05 DIAGNOSIS — M542 Cervicalgia: Secondary | ICD-10-CM | POA: Diagnosis not present

## 2020-02-05 DIAGNOSIS — G301 Alzheimer's disease with late onset: Secondary | ICD-10-CM | POA: Diagnosis not present

## 2020-02-05 DIAGNOSIS — M25561 Pain in right knee: Secondary | ICD-10-CM | POA: Diagnosis not present

## 2020-02-07 DIAGNOSIS — M25561 Pain in right knee: Secondary | ICD-10-CM | POA: Diagnosis not present

## 2020-02-07 DIAGNOSIS — M545 Low back pain: Secondary | ICD-10-CM | POA: Diagnosis not present

## 2020-02-07 DIAGNOSIS — R293 Abnormal posture: Secondary | ICD-10-CM | POA: Diagnosis not present

## 2020-02-07 DIAGNOSIS — U071 COVID-19: Secondary | ICD-10-CM | POA: Diagnosis not present

## 2020-02-07 DIAGNOSIS — M542 Cervicalgia: Secondary | ICD-10-CM | POA: Diagnosis not present

## 2020-02-07 DIAGNOSIS — M21061 Valgus deformity, not elsewhere classified, right knee: Secondary | ICD-10-CM | POA: Diagnosis not present

## 2020-02-08 DIAGNOSIS — M542 Cervicalgia: Secondary | ICD-10-CM | POA: Diagnosis not present

## 2020-02-08 DIAGNOSIS — U071 COVID-19: Secondary | ICD-10-CM | POA: Diagnosis not present

## 2020-02-08 DIAGNOSIS — M545 Low back pain: Secondary | ICD-10-CM | POA: Diagnosis not present

## 2020-02-08 DIAGNOSIS — M25561 Pain in right knee: Secondary | ICD-10-CM | POA: Diagnosis not present

## 2020-02-08 DIAGNOSIS — R293 Abnormal posture: Secondary | ICD-10-CM | POA: Diagnosis not present

## 2020-02-08 DIAGNOSIS — M21061 Valgus deformity, not elsewhere classified, right knee: Secondary | ICD-10-CM | POA: Diagnosis not present

## 2020-02-12 DIAGNOSIS — M21061 Valgus deformity, not elsewhere classified, right knee: Secondary | ICD-10-CM | POA: Diagnosis not present

## 2020-02-12 DIAGNOSIS — R293 Abnormal posture: Secondary | ICD-10-CM | POA: Diagnosis not present

## 2020-02-12 DIAGNOSIS — M542 Cervicalgia: Secondary | ICD-10-CM | POA: Diagnosis not present

## 2020-02-12 DIAGNOSIS — M545 Low back pain: Secondary | ICD-10-CM | POA: Diagnosis not present

## 2020-02-12 DIAGNOSIS — U071 COVID-19: Secondary | ICD-10-CM | POA: Diagnosis not present

## 2020-02-12 DIAGNOSIS — M25561 Pain in right knee: Secondary | ICD-10-CM | POA: Diagnosis not present

## 2020-02-14 DIAGNOSIS — M542 Cervicalgia: Secondary | ICD-10-CM | POA: Diagnosis not present

## 2020-02-14 DIAGNOSIS — U071 COVID-19: Secondary | ICD-10-CM | POA: Diagnosis not present

## 2020-02-14 DIAGNOSIS — M21061 Valgus deformity, not elsewhere classified, right knee: Secondary | ICD-10-CM | POA: Diagnosis not present

## 2020-02-14 DIAGNOSIS — M25561 Pain in right knee: Secondary | ICD-10-CM | POA: Diagnosis not present

## 2020-02-14 DIAGNOSIS — R293 Abnormal posture: Secondary | ICD-10-CM | POA: Diagnosis not present

## 2020-02-14 DIAGNOSIS — M545 Low back pain: Secondary | ICD-10-CM | POA: Diagnosis not present

## 2020-02-19 DIAGNOSIS — M542 Cervicalgia: Secondary | ICD-10-CM | POA: Diagnosis not present

## 2020-02-19 DIAGNOSIS — U071 COVID-19: Secondary | ICD-10-CM | POA: Diagnosis not present

## 2020-02-19 DIAGNOSIS — M21061 Valgus deformity, not elsewhere classified, right knee: Secondary | ICD-10-CM | POA: Diagnosis not present

## 2020-02-19 DIAGNOSIS — M25561 Pain in right knee: Secondary | ICD-10-CM | POA: Diagnosis not present

## 2020-02-19 DIAGNOSIS — M545 Low back pain: Secondary | ICD-10-CM | POA: Diagnosis not present

## 2020-02-19 DIAGNOSIS — R293 Abnormal posture: Secondary | ICD-10-CM | POA: Diagnosis not present

## 2020-02-21 DIAGNOSIS — M542 Cervicalgia: Secondary | ICD-10-CM | POA: Diagnosis not present

## 2020-02-21 DIAGNOSIS — M21061 Valgus deformity, not elsewhere classified, right knee: Secondary | ICD-10-CM | POA: Diagnosis not present

## 2020-02-21 DIAGNOSIS — M25561 Pain in right knee: Secondary | ICD-10-CM | POA: Diagnosis not present

## 2020-02-21 DIAGNOSIS — U071 COVID-19: Secondary | ICD-10-CM | POA: Diagnosis not present

## 2020-02-21 DIAGNOSIS — R293 Abnormal posture: Secondary | ICD-10-CM | POA: Diagnosis not present

## 2020-02-21 DIAGNOSIS — M545 Low back pain: Secondary | ICD-10-CM | POA: Diagnosis not present

## 2020-02-26 DIAGNOSIS — R293 Abnormal posture: Secondary | ICD-10-CM | POA: Diagnosis not present

## 2020-02-26 DIAGNOSIS — M542 Cervicalgia: Secondary | ICD-10-CM | POA: Diagnosis not present

## 2020-02-26 DIAGNOSIS — U071 COVID-19: Secondary | ICD-10-CM | POA: Diagnosis not present

## 2020-02-26 DIAGNOSIS — M545 Low back pain: Secondary | ICD-10-CM | POA: Diagnosis not present

## 2020-02-26 DIAGNOSIS — M25561 Pain in right knee: Secondary | ICD-10-CM | POA: Diagnosis not present

## 2020-02-26 DIAGNOSIS — M21061 Valgus deformity, not elsewhere classified, right knee: Secondary | ICD-10-CM | POA: Diagnosis not present

## 2020-02-29 DIAGNOSIS — M542 Cervicalgia: Secondary | ICD-10-CM | POA: Diagnosis not present

## 2020-02-29 DIAGNOSIS — M21061 Valgus deformity, not elsewhere classified, right knee: Secondary | ICD-10-CM | POA: Diagnosis not present

## 2020-02-29 DIAGNOSIS — M25561 Pain in right knee: Secondary | ICD-10-CM | POA: Diagnosis not present

## 2020-02-29 DIAGNOSIS — U071 COVID-19: Secondary | ICD-10-CM | POA: Diagnosis not present

## 2020-02-29 DIAGNOSIS — M545 Low back pain: Secondary | ICD-10-CM | POA: Diagnosis not present

## 2020-02-29 DIAGNOSIS — R293 Abnormal posture: Secondary | ICD-10-CM | POA: Diagnosis not present

## 2020-03-18 DIAGNOSIS — Z9189 Other specified personal risk factors, not elsewhere classified: Secondary | ICD-10-CM | POA: Diagnosis not present

## 2020-03-18 DIAGNOSIS — Z20828 Contact with and (suspected) exposure to other viral communicable diseases: Secondary | ICD-10-CM | POA: Diagnosis not present

## 2020-03-21 ENCOUNTER — Non-Acute Institutional Stay (SKILLED_NURSING_FACILITY): Payer: Medicare Other | Admitting: Adult Health

## 2020-03-21 DIAGNOSIS — M810 Age-related osteoporosis without current pathological fracture: Secondary | ICD-10-CM | POA: Diagnosis not present

## 2020-03-21 DIAGNOSIS — I10 Essential (primary) hypertension: Secondary | ICD-10-CM

## 2020-03-21 DIAGNOSIS — K219 Gastro-esophageal reflux disease without esophagitis: Secondary | ICD-10-CM | POA: Diagnosis not present

## 2020-03-21 DIAGNOSIS — J4550 Severe persistent asthma, uncomplicated: Secondary | ICD-10-CM

## 2020-03-21 DIAGNOSIS — G301 Alzheimer's disease with late onset: Secondary | ICD-10-CM

## 2020-03-21 DIAGNOSIS — F0280 Dementia in other diseases classified elsewhere without behavioral disturbance: Secondary | ICD-10-CM

## 2020-03-22 ENCOUNTER — Encounter: Payer: Self-pay | Admitting: Adult Health

## 2020-03-22 NOTE — Progress Notes (Signed)
Location:  [facility] of Service:  SNF (31) Provider:   Cindi Carbon, ANP [place] [phone]   Gayland Curry, DO  Patient Care Team: Gayland Curry, DO as PCP - General (Geriatric Medicine) Elsie Stain, MD (Pulmonary Disease)  Extended Emergency Contact Information Primary Emergency Contact: Earlene Plater of [place] Phone: [phone] Mobile Phone: [phone] Relation: Daughter  Code Status:  DNR Goals of care: Advanced Directive information Advanced Directives 09/11/2019  Does Patient Have a Medical Advance Directive? Yes  Type of Paramedic of [place];Out of facility DNR (pink MOST or yellow form);Living will  Does patient want to make changes to medical advance directive? No - Patient declined  Copy of [place] in Chart? Yes - validated most recent copy scanned in chart (See row information)  Pre-existing out of facility DNR order (yellow form or pink MOST form) Yellow form placed in chart (order not valid for inpatient use)     Chief Complaint  Patient presents with   Medical Management of Chronic Issues    HPI:  Pt is a 86 y.o. female seen today for medical management of chronic diseases.   Ms. Wible has a hx of progressive dementia. She was sun downing and Vistaril was prescribed in June but since that time the order expired and her symptoms improved. She does tend to have more confusion in the afternoon but is now less agitated and anxious.   She has a hx of difficult to control asthma but is doing well with her current regimen prescribed by pulmonary. No wheezing, cough, sob.   No reports of indigestion, n, v or reflux. Currently on prilosec.   OP: noted hx of thus but due to the fact that she is no longer ambulatory she is not going out for dexa scans    Past Medical History:  Diagnosis Date   Age-related osteoporosis without  current pathological fracture    Alzheimer's disease ([place])    with late onset   Arthritis    Asthma    Deficiency of other specified B group vitamins    Depression    Environmental allergies    mold   Fall    GERD without esophagitis    Hyperlipidemia    Hypertension    Hypertensive kidney disease with CKD (chronic kidney disease)    with stage I through stage 4 CKD    Memory loss    Seasonal allergies    Unspecified osteoarthritis, unspecified site    Past Surgical History:  Procedure Laterality Date   HIP ARTHROPLASTY  06/25/2011   Procedure: ARTHROPLASTY BIPOLAR HIP;  Surgeon: Laurice Record [place];  Location: [facility];  Service: Orthopedics;  Laterality: Right;   NASAL SINUS SURGERY     VAGINAL HYSTERECTOMY      Allergies  Allergen Reactions   Azithromycin Other (See Comments)    Nausea, weakness    Aspirin Other (See Comments)    Unknown reaction   Molds & Smuts    Septra [Sulfamethoxazole-Trimethoprim]    Sulfa Antibiotics    Sulfonamide Derivatives Other (See Comments)    Unknown reaction    Outpatient Encounter Medications as of 03/21/2020  Medication Sig   acetaminophen (TYLENOL) 500 MG tablet Take 500 mg by mouth in the morning, at noon, and at bedtime.    budesonide (PULMICORT) 0.5 MG/2ML nebulizer solution Take 0.5 mg by nebulization 2 (two) times daily.   Calcium Carb-Cholecalciferol (CALCIUM  600-D PO) Take 1 tablet by mouth daily.   Cholecalciferol (VITAMIN D) 2000 units tablet Take 2,000 Units by mouth at bedtime.   dextromethorphan (DELSYM) 30 MG/5ML liquid Take 60 mg by mouth every 12 (twelve) hours as needed for cough.   docusate sodium (COLACE) 100 MG capsule Take 100 mg by mouth at bedtime.    DUPIXENT 300 MG/2ML SOSY Inject 1 pen/syringe subcutaneously every other week STARTING ON DAY 15   EPINEPHrine (EPIPEN 2-PAK) 0.3 mg/0.3 mL IJ SOAJ injection Inject 0.3 mg into the muscle as needed for anaphylaxis. Prescribed with  dupexient   FLUoxetine (PROZAC) 20 MG capsule Take 40 mg by mouth daily.    fluticasone (FLONASE) 50 MCG/ACT nasal spray Place 2 sprays into both nostrils daily. 2 sprays: nasal Special instruction: administer 2 sprays into nostrils daily once  a morning 8 am and 11 am   formoterol (PERFOROMIST) 20 MCG/2ML nebulizer solution Take 20 mcg by nebulization 2 (two) times daily.   furosemide (LASIX) 40 MG tablet Take 40 mg by mouth daily.   ipratropium-albuterol (DUONEB) 0.5-2.5 (3) MG/3ML SOLN Take 3 mLs by nebulization every 4 (four) hours as needed.   memantine (NAMENDA) 10 MG tablet Take 1 tablet (10 mg total) by mouth 2 (two) times daily.   montelukast (SINGULAIR) 10 MG tablet Take 1 tablet (10 mg total) by mouth daily.   olmesartan (BENICAR) 20 MG tablet Take 40 mg by mouth daily.    omeprazole (PRILOSEC) 20 MG capsule Take 20 mg by mouth daily.    ondansetron (ZOFRAN) 4 MG tablet Take 4 mg by mouth every 6 (six) hours as needed for nausea or vomiting.   polyethylene glycol (MIRALAX / GLYCOLAX) packet Take 17 g by mouth daily.   triamcinolone cream (KENALOG) 0.1 % Apply 1 application topically as needed.   vitamin B-12 (CYANOCOBALAMIN) 1000 MCG tablet Take 3,000 mcg by mouth daily.   [DISCONTINUED] hydrOXYzine (ATARAX/VISTARIL) 25 MG tablet Take 12.5 mg by mouth 2 (two) times daily as needed. X 14 days   [DISCONTINUED] rivaroxaban (XARELTO) 10 MG TABS tablet Take 1 tablet (10 mg total) by mouth daily.   No facility-administered encounter medications on file as of 03/21/2020.    Review of Systems  Constitutional: Negative for activity change, appetite change, chills, diaphoresis, fatigue, fever and unexpected weight change.  HENT: Negative for congestion.   Respiratory: Negative for cough, shortness of breath and wheezing.   Cardiovascular: Negative for chest pain, palpitations and leg swelling.  Gastrointestinal: Negative for abdominal distention, abdominal pain, constipation  and diarrhea.  Genitourinary: Negative for difficulty urinating and dysuria.  Musculoskeletal: Positive for gait problem. Negative for arthralgias, back pain, joint swelling, myalgias and neck pain.  Neurological: Negative for dizziness, tremors, seizures, syncope, facial asymmetry, speech difficulty, weakness, light-headedness, numbness and headaches.  Psychiatric/Behavioral: Positive for confusion. Negative for agitation, behavioral problems, self-injury and suicidal ideas. The patient is not nervous/anxious.     Immunization History  Administered Date(s) Administered   Influenza Inj Mdck Quad Pf 05/18/2019   Influenza Split 04/04/2012, 05/03/2013, 05/10/2015   Influenza Whole 04/18/2008, 06/04/2009, 04/03/2010, 03/30/2011   Influenza, High Dose Seasonal PF 04/20/2018   Influenza,inj,Quad PF,6+ Mos 03/26/2017   Influenza-Unspecified 03/26/2010, 04/13/2012, 05/08/2013, 05/14/2014, 06/03/2014, 05/13/2015, 04/27/2016, 04/29/2016   Moderna SARS-COVID-2 Vaccination 09/05/2019, 10/03/2019   Pneumococcal Conjugate-13 03/05/2014   Pneumococcal Polysaccharide-23 06/04/2009   Pneumococcal-Unspecified 03/13/2009   Td 03/13/2009, 04/13/2012   Zoster 03/21/2010   Pertinent  Health Maintenance Due  Topic Date Due   INFLUENZA  VACCINE  03/03/2020   PNA vac Low Risk Adult  Completed   DEXA SCAN  Discontinued   Fall Risk  01/11/2020 12/07/2012  Falls in the past year? 0 Yes  Number falls in past yr: 0 2 or more  Injury with Fall? 0 -  Risk Factor Category  - High Fall Risk  Risk for fall due to : - Impaired balance/gait   Functional Status Survey:    Vitals:   03/22/20 1159  Weight: 159 lb 3.2 oz (72.2 kg)   Body mass index is 29.12 kg/m. Physical Exam Vitals and nursing note reviewed.  Constitutional:      General: She is not in acute distress.    Appearance: She is not diaphoretic.  HENT:     Head: Normocephalic and atraumatic.  Neck:     Vascular: No JVD.    Cardiovascular:     Rate and Rhythm: Normal rate and regular rhythm.     Heart sounds: No murmur heard.   Pulmonary:     Effort: Pulmonary effort is normal. No respiratory distress.     Breath sounds: No wheezing.  Abdominal:     General: Bowel sounds are normal. There is no distension.     Palpations: Abdomen is soft.  Musculoskeletal:     Right lower leg: No edema.     Left lower leg: No edema.  Skin:    General: Skin is warm and dry.  Neurological:     General: No focal deficit present.     Mental Status: She is alert. Mental status is at baseline.     Comments: Oriented x 2  Psychiatric:        Mood and Affect: Mood normal.     Labs reviewed: Recent Labs    12/18/19 0000 12/18/19 0700  NA 134*  --   K 4.4  --   CL 98*  --   CO2 22  --   BUN 24*  --   CREATININE 0.9  --   CALCIUM  --  9.6   No results for input(s): AST, ALT, ALKPHOS, BILITOT, PROT, ALBUMIN in the last 8760 hours. Recent Labs    12/18/19 0000  WBC 9.1  HGB 11.0*  HCT 33*  PLT 245   Lab Results  Component Value Date   TSH 2.440 09/04/2015   No results found for: HGBA1C No results found for: CHOL, HDL, LDLCALC, LDLDIRECT, TRIG, CHOLHDL  Significant Diagnostic Results in last 30 days:  No results found.  Assessment/Plan  1. Late onset Alzheimer's disease without behavioral disturbance (HCC) Improved afternoon behaviors.  Continue to monitor and treat accordingly.  Continue namenda   2. Severe persistent asthma without complication Controlled with no acute exacerbations in quite some time. Continues on Dupixent, perforomist, pulmicort, and duonebs prn   3. Gastro-esophageal reflux disease without esophagitis Controlled Continue prilosec   4. Osteoporosis without current pathological fracture, unspecified osteoporosis type Continue Ca with Vit D supplementation. No additional treatment indicated due to non weight bearing status.   5. Essential hypertension Controlled.   Continue Lasix and olmesartan    Family/ staff Communication: discussed with her nurse Line  Labs/tests ordered:  NA

## 2020-03-25 DIAGNOSIS — Z20828 Contact with and (suspected) exposure to other viral communicable diseases: Secondary | ICD-10-CM | POA: Diagnosis not present

## 2020-03-25 DIAGNOSIS — Z9189 Other specified personal risk factors, not elsewhere classified: Secondary | ICD-10-CM | POA: Diagnosis not present

## 2020-03-27 DIAGNOSIS — H35033 Hypertensive retinopathy, bilateral: Secondary | ICD-10-CM | POA: Diagnosis not present

## 2020-03-27 DIAGNOSIS — H35363 Drusen (degenerative) of macula, bilateral: Secondary | ICD-10-CM | POA: Diagnosis not present

## 2020-03-27 DIAGNOSIS — H43813 Vitreous degeneration, bilateral: Secondary | ICD-10-CM | POA: Diagnosis not present

## 2020-03-27 DIAGNOSIS — H35373 Puckering of macula, bilateral: Secondary | ICD-10-CM | POA: Diagnosis not present

## 2020-04-23 DIAGNOSIS — L570 Actinic keratosis: Secondary | ICD-10-CM | POA: Diagnosis not present

## 2020-04-23 DIAGNOSIS — D225 Melanocytic nevi of trunk: Secondary | ICD-10-CM | POA: Diagnosis not present

## 2020-04-23 DIAGNOSIS — Z85828 Personal history of other malignant neoplasm of skin: Secondary | ICD-10-CM | POA: Diagnosis not present

## 2020-04-23 DIAGNOSIS — I872 Venous insufficiency (chronic) (peripheral): Secondary | ICD-10-CM | POA: Diagnosis not present

## 2020-04-23 DIAGNOSIS — L821 Other seborrheic keratosis: Secondary | ICD-10-CM | POA: Diagnosis not present

## 2020-04-23 DIAGNOSIS — I8311 Varicose veins of right lower extremity with inflammation: Secondary | ICD-10-CM | POA: Diagnosis not present

## 2020-04-23 DIAGNOSIS — I8312 Varicose veins of left lower extremity with inflammation: Secondary | ICD-10-CM | POA: Diagnosis not present

## 2020-05-06 ENCOUNTER — Encounter: Payer: Self-pay | Admitting: Adult Health

## 2020-05-06 ENCOUNTER — Non-Acute Institutional Stay (SKILLED_NURSING_FACILITY): Payer: Medicare Other | Admitting: Adult Health

## 2020-05-06 DIAGNOSIS — Z9189 Other specified personal risk factors, not elsewhere classified: Secondary | ICD-10-CM | POA: Diagnosis not present

## 2020-05-06 DIAGNOSIS — I10 Essential (primary) hypertension: Secondary | ICD-10-CM | POA: Diagnosis not present

## 2020-05-06 DIAGNOSIS — J302 Other seasonal allergic rhinitis: Secondary | ICD-10-CM | POA: Diagnosis not present

## 2020-05-06 DIAGNOSIS — J4540 Moderate persistent asthma, uncomplicated: Secondary | ICD-10-CM | POA: Diagnosis not present

## 2020-05-06 DIAGNOSIS — G301 Alzheimer's disease with late onset: Secondary | ICD-10-CM

## 2020-05-06 DIAGNOSIS — M542 Cervicalgia: Secondary | ICD-10-CM | POA: Diagnosis not present

## 2020-05-06 DIAGNOSIS — F0280 Dementia in other diseases classified elsewhere without behavioral disturbance: Secondary | ICD-10-CM | POA: Diagnosis not present

## 2020-05-06 DIAGNOSIS — R197 Diarrhea, unspecified: Secondary | ICD-10-CM | POA: Diagnosis not present

## 2020-05-06 DIAGNOSIS — Z20828 Contact with and (suspected) exposure to other viral communicable diseases: Secondary | ICD-10-CM | POA: Diagnosis not present

## 2020-05-06 NOTE — Progress Notes (Signed)
Location:  [facility] of Service:  SNF (31) Provider:   Cindi Carbon, ANP [place] [phone]  Gayland Curry, DO  Patient Care Team: Gayland Curry, DO as PCP - General (Geriatric Medicine) Elsie Stain, MD (Pulmonary Disease)  Extended Emergency Contact Information Primary Emergency Contact: Earlene Plater of [place] Phone: [phone] Mobile Phone: [phone] Relation: Daughter  Code Status:  DNR Goals of care: Advanced Directive information Advanced Directives 09/11/2019  Does Patient Have a Medical Advance Directive? Yes  Type of Paramedic of [place];Out of facility DNR (pink MOST or yellow form);Living will  Does patient want to make changes to medical advance directive? No - Patient declined  Copy of [place] in Chart? Yes - validated most recent copy scanned in chart (See row information)  Pre-existing out of facility DNR order (yellow form or pink MOST form) Yellow form placed in chart (order not valid for inpatient use)     Chief Complaint  Patient presents with  . Medical Management of Chronic Issues    HPI:  Pt is a 86 y.o. female seen today for medical management of chronic diseases.   An SBAR was filled out stating that Ms. Oscar has had diarrhea off and on for two weeks. Another resident near her has also had diarrhea. Ms. Lemmie Evens does not have any fever, abd pain, nausea, decreased appetite, sore throat, body aches, etc. She is vaccinated for covid with Moderna.  The CNA at the bedside states she doesn't think she has had that much diarrhea. She takes miralax.   Neck pain: currently taking tylenol tid with no new complaints   Asthma: no issues with cough or wheeze  Allergic rhinitis: no current complaints   Dementia: has occasional periods of delusions in the afternoon and "yelling out" for help.  Past Medical History:  Diagnosis  Date  . Age-related osteoporosis without current pathological fracture   . Alzheimer's disease ([place])    with late onset  . Arthritis   . Asthma   . Deficiency of other specified B group vitamins   . Depression   . Environmental allergies    mold  . Fall   . GERD without esophagitis   . Hyperlipidemia   . Hypertension   . Hypertensive kidney disease with CKD (chronic kidney disease)    with stage I through stage 4 CKD   . Memory loss   . Seasonal allergies   . Unspecified osteoarthritis, unspecified site    Past Surgical History:  Procedure Laterality Date  . HIP ARTHROPLASTY  06/25/2011   Procedure: ARTHROPLASTY BIPOLAR HIP;  Surgeon: Laurice Record [place];  Location: [facility];  Service: Orthopedics;  Laterality: Right;  . NASAL SINUS SURGERY    . VAGINAL HYSTERECTOMY      Allergies  Allergen Reactions  . Azithromycin Other (See Comments)    Nausea, weakness   . Aspirin Other (See Comments)    Unknown reaction  . Molds & Smuts   . Septra [Sulfamethoxazole-Trimethoprim]   . Sulfa Antibiotics   . Sulfonamide Derivatives Other (See Comments)    Unknown reaction    Outpatient Encounter Medications as of 05/06/2020  Medication Sig  . acetaminophen (TYLENOL) 500 MG tablet Take 500 mg by mouth in the morning, at noon, and at bedtime.   . budesonide (PULMICORT) 0.5 MG/2ML nebulizer solution Take 0.5 mg by nebulization 2 (two) times daily.  . Calcium Carb-Cholecalciferol (CALCIUM 600-D PO)  Take 1 tablet by mouth daily.  . Cholecalciferol (VITAMIN D) 2000 units tablet Take 2,000 Units by mouth at bedtime.  Marland Kitchen dextromethorphan (DELSYM) 30 MG/5ML liquid Take 60 mg by mouth every 12 (twelve) hours as needed for cough.  . docusate sodium (COLACE) 100 MG capsule Take 100 mg by mouth at bedtime.   . DUPIXENT 300 MG/2ML SOSY Inject 1 pen/syringe subcutaneously every other week STARTING ON DAY 15  . EPINEPHrine (EPIPEN 2-PAK) 0.3 mg/0.3 mL IJ SOAJ injection Inject 0.3 mg into the muscle as  needed for anaphylaxis. Prescribed with dupexient  . FLUoxetine (PROZAC) 20 MG capsule Take 40 mg by mouth daily.   . fluticasone (FLONASE) 50 MCG/ACT nasal spray Place 2 sprays into both nostrils daily. 2 sprays: nasal Special instruction: administer 2 sprays into nostrils daily once  a morning 8 am and 11 am  . formoterol (PERFOROMIST) 20 MCG/2ML nebulizer solution Take 20 mcg by nebulization 2 (two) times daily.  . furosemide (LASIX) 40 MG tablet Take 40 mg by mouth daily.  Marland Kitchen ipratropium-albuterol (DUONEB) 0.5-2.5 (3) MG/3ML SOLN Take 3 mLs by nebulization every 4 (four) hours as needed.  . memantine (NAMENDA) 10 MG tablet Take 1 tablet (10 mg total) by mouth 2 (two) times daily.  . montelukast (SINGULAIR) 10 MG tablet Take 1 tablet (10 mg total) by mouth daily.  Marland Kitchen olmesartan (BENICAR) 20 MG tablet Take 40 mg by mouth daily.   Marland Kitchen omeprazole (PRILOSEC) 20 MG capsule Take 20 mg by mouth daily.   . ondansetron (ZOFRAN) 4 MG tablet Take 4 mg by mouth every 6 (six) hours as needed for nausea or vomiting.  . polyethylene glycol (MIRALAX / GLYCOLAX) packet Take 17 g by mouth daily.  Marland Kitchen triamcinolone cream (KENALOG) 0.1 % Apply 1 application topically as needed.  . vitamin B-12 (CYANOCOBALAMIN) 1000 MCG tablet Take 3,000 mcg by mouth daily.  . [DISCONTINUED] rivaroxaban (XARELTO) 10 MG TABS tablet Take 1 tablet (10 mg total) by mouth daily.   No facility-administered encounter medications on file as of 05/06/2020.    Review of Systems  Immunization History  Administered Date(s) Administered  . Influenza Inj Mdck Quad Pf 05/18/2019  . Influenza Split 04/04/2012, 05/03/2013, 05/10/2015  . Influenza Whole 04/18/2008, 06/04/2009, 04/03/2010, 03/30/2011  . Influenza, High Dose Seasonal PF 04/20/2018  . Influenza,inj,Quad PF,6+ Mos 03/26/2017  . Influenza-Unspecified 03/26/2010, 04/13/2012, 05/08/2013, 05/14/2014, 06/03/2014, 05/13/2015, 04/27/2016, 04/29/2016  . Moderna SARS-COVID-2 Vaccination  09/05/2019, 10/03/2019  . Pneumococcal Conjugate-13 03/05/2014  . Pneumococcal Polysaccharide-23 06/04/2009  . Pneumococcal-Unspecified 03/13/2009  . Td 03/13/2009, 04/13/2012  . Zoster 03/21/2010   Pertinent  Health Maintenance Due  Topic Date Due  . INFLUENZA VACCINE  03/03/2020  . PNA vac Low Risk Adult  Completed  . DEXA SCAN  Discontinued   Fall Risk  01/11/2020 12/07/2012  Falls in the past year? 0 Yes  Number falls in past yr: 0 2 or more  Injury with Fall? 0 -  Risk Factor Category  - High Fall Risk  Risk for fall due to : - Impaired balance/gait   Functional Status Survey:    Vitals:   05/06/20 1657  Temp: (!) 97.5 F (36.4 C)  Weight: 155 lb 9.6 oz (70.6 kg)   Body mass index is 28.46 kg/m. Physical Exam  Labs reviewed: Recent Labs    12/18/19 0000 12/18/19 0700  NA 134*  --   K 4.4  --   CL 98*  --   CO2 22  --  BUN 24*  --   CREATININE 0.9  --   CALCIUM  --  9.6   No results for input(s): AST, ALT, ALKPHOS, BILITOT, PROT, ALBUMIN in the last 8760 hours. Recent Labs    12/18/19 0000  WBC 9.1  HGB 11.0*  HCT 33*  PLT 245   Lab Results  Component Value Date   TSH 2.440 09/04/2015   No results found for: HGBA1C No results found for: CHOL, HDL, LDLCALC, LDLDIRECT, TRIG, CHOLHDL  Significant Diagnostic Results in last 30 days:  No results found.  Assessment/Plan 1. Diarrhea, unspecified type Change miralax to qd prn She currently doesn't have any loose stools and no other symptoms. The severity of this symptom is unknown as the pt can not provide a hx. Covid swab ordered with isolation. If negative would remove isolation. If diarrhea returns and covid neg would also check stool studies.   2. Moderate persistent asthma without complication Well controlled on Dupixent and Perforomist 20 mcg bid and Pulmicort bid  Continue Duoneb q 4 prn   3. Seasonal allergic rhinitis, unspecified trigger Controlled with Singulair 10 mg qd,   4. Late  onset Alzheimer's dementia without behavioral disturbance (HCC) Moderate to severe Continue supportive care in the skilled unit. Continue namenda 10 mg bid. Consider restarting Vistaril as needed if afternoon agitation worsens.   5. Neck pain Continue Tylenol 500 mg tid   6. Essential hypertension Controlled Continue Benicar 40 mg qd and Lasix 40 mg qd   Family/ staff Communication: discussed with her nurse  Labs/tests ordered:  Covid swab

## 2020-06-10 ENCOUNTER — Non-Acute Institutional Stay (SKILLED_NURSING_FACILITY): Payer: Medicare Other | Admitting: Adult Health

## 2020-06-10 DIAGNOSIS — R600 Localized edema: Secondary | ICD-10-CM | POA: Diagnosis not present

## 2020-06-10 DIAGNOSIS — J4550 Severe persistent asthma, uncomplicated: Secondary | ICD-10-CM

## 2020-06-10 DIAGNOSIS — I10 Essential (primary) hypertension: Secondary | ICD-10-CM | POA: Diagnosis not present

## 2020-06-10 DIAGNOSIS — G301 Alzheimer's disease with late onset: Secondary | ICD-10-CM

## 2020-06-10 DIAGNOSIS — F0280 Dementia in other diseases classified elsewhere without behavioral disturbance: Secondary | ICD-10-CM

## 2020-06-10 DIAGNOSIS — J302 Other seasonal allergic rhinitis: Secondary | ICD-10-CM | POA: Diagnosis not present

## 2020-06-13 ENCOUNTER — Encounter: Payer: Self-pay | Admitting: Adult Health

## 2020-06-13 NOTE — Progress Notes (Signed)
Location:  Occupational psychologist of Service:  SNF (31) Provider:  Cindi Carbon, ANP [place] [phone]  Gayland Curry, DO  Patient Care Team: Gayland Curry, DO as PCP - General (Geriatric Medicine) Elsie Stain, MD (Pulmonary Disease)  Extended Emergency Contact Information Primary Emergency Contact: Earlene Plater of [place] Phone: [phone] Mobile Phone: [phone] Relation: Daughter  Code Status:  DNR Goals of care: Advanced Directive information Advanced Directives 09/11/2019  Does Patient Have a Medical Advance Directive? Yes  Type of Paramedic of [place];Out of facility DNR (pink MOST or yellow form);Living will  Does patient want to make changes to medical advance directive? No - Patient declined  Copy of [place] in Chart? Yes - validated most recent copy scanned in chart (See row information)  Pre-existing out of facility DNR order (yellow form or pink MOST form) Yellow form placed in chart (order not valid for inpatient use)     Chief Complaint  Patient presents with   Medical Management of Chronic Issues    HPI:  Pt is a 86 y.o. female seen today for medical management of chronic diseases.    Dementia: MMSE 22/30, pt has periods of anxiety and yells"help help" but can be redirected per staff. She is not ambulatory and spends most of her time in the chair.   Asthma: no cough, wheeze, or sob.   No issues with edema or weight gain  BP controlled  Weight trending down 6 lbs in the past three months. Drinking boost.     Past Medical History:  Diagnosis Date   Age-related osteoporosis without current pathological fracture    Alzheimer's disease ([place])    with late onset   Arthritis    Asthma    Deficiency of other specified B group vitamins    Depression    Environmental allergies    mold   Fall    GERD without esophagitis     Hyperlipidemia    Hypertension    Hypertensive kidney disease with CKD (chronic kidney disease)    with stage I through stage 4 CKD    Memory loss    Seasonal allergies    Unspecified osteoarthritis, unspecified site    Past Surgical History:  Procedure Laterality Date   HIP ARTHROPLASTY  06/25/2011   Procedure: ARTHROPLASTY BIPOLAR HIP;  Surgeon: Laurice Record [place];  Location: [facility];  Service: Orthopedics;  Laterality: Right;   NASAL SINUS SURGERY     VAGINAL HYSTERECTOMY      Allergies  Allergen Reactions   Azithromycin Other (See Comments)    Nausea, weakness    Aspirin Other (See Comments)    Unknown reaction   Molds & Smuts    Septra [Sulfamethoxazole-Trimethoprim]    Sulfa Antibiotics    Sulfonamide Derivatives Other (See Comments)    Unknown reaction    Outpatient Encounter Medications as of 06/10/2020  Medication Sig   acetaminophen (TYLENOL) 500 MG tablet Take 500 mg by mouth in the morning, at noon, and at bedtime.    budesonide (PULMICORT) 0.5 MG/2ML nebulizer solution Take 0.5 mg by nebulization 2 (two) times daily.   Calcium Carb-Cholecalciferol (CALCIUM 600-D PO) Take 1 tablet by mouth daily.   Camphor-Menthol-Methyl Sal (SALONPAS) 3.08-08-08 % PTCH Apply 1 patch topically daily. On in the am and off in the pm   docusate sodium (COLACE) 100 MG capsule Take 100 mg by mouth at bedtime.  DUPIXENT 300 MG/2ML SOSY Inject 1 pen/syringe subcutaneously every other week STARTING ON DAY 15   FLUoxetine (PROZAC) 20 MG capsule Take 40 mg by mouth daily.    fluticasone (FLONASE) 50 MCG/ACT nasal spray Place 2 sprays into both nostrils daily. 2 sprays: nasal Special instruction: administer 2 sprays into nostrils daily once  a morning 8 am and 11 am   formoterol (PERFOROMIST) 20 MCG/2ML nebulizer solution Take 20 mcg by nebulization 2 (two) times daily.   furosemide (LASIX) 20 MG tablet Take 20 mg by mouth daily.   ipratropium-albuterol (DUONEB)  0.5-2.5 (3) MG/3ML SOLN Take 3 mLs by nebulization every 4 (four) hours as needed.   lactose free nutrition (BOOST) LIQD Take 237 mLs by mouth daily.   memantine (NAMENDA) 10 MG tablet Take 1 tablet (10 mg total) by mouth 2 (two) times daily.   montelukast (SINGULAIR) 10 MG tablet Take 1 tablet (10 mg total) by mouth daily.   omeprazole (PRILOSEC) 20 MG capsule Take 20 mg by mouth daily.    Polyethyl Glycol-Propyl Glycol (SYSTANE) 0.4-0.3 % GEL ophthalmic gel Place 1 application into both eyes at bedtime.   Cholecalciferol (VITAMIN D) 2000 units tablet Take 2,000 Units by mouth at bedtime.   dextromethorphan (DELSYM) 30 MG/5ML liquid Take 60 mg by mouth every 12 (twelve) hours as needed for cough.   EPINEPHrine (EPIPEN 2-PAK) 0.3 mg/0.3 mL IJ SOAJ injection Inject 0.3 mg into the muscle as needed for anaphylaxis. Prescribed with dupexient   olmesartan (BENICAR) 20 MG tablet Take 40 mg by mouth daily.    ondansetron (ZOFRAN) 4 MG tablet Take 4 mg by mouth every 6 (six) hours as needed for nausea or vomiting.   polyethylene glycol (MIRALAX / GLYCOLAX) packet Take 17 g by mouth daily as needed.   triamcinolone cream (KENALOG) 0.1 % Apply 1 application topically as needed.   vitamin B-12 (CYANOCOBALAMIN) 1000 MCG tablet Take 3,000 mcg by mouth daily.   [DISCONTINUED] furosemide (LASIX) 40 MG tablet Take 40 mg by mouth daily.   [DISCONTINUED] rivaroxaban (XARELTO) 10 MG TABS tablet Take 1 tablet (10 mg total) by mouth daily.   No facility-administered encounter medications on file as of 06/10/2020.    Review of Systems  Constitutional: Negative for activity change, appetite change, chills, diaphoresis, fatigue, fever and unexpected weight change.  HENT: Negative for congestion.   Respiratory: Negative for cough, shortness of breath and wheezing.   Cardiovascular: Negative for chest pain, palpitations and leg swelling.  Gastrointestinal: Negative for abdominal distention, abdominal  pain, constipation and diarrhea.  Genitourinary: Negative for difficulty urinating and dysuria.  Musculoskeletal: Positive for arthralgias and gait problem. Negative for back pain, joint swelling and myalgias.  Skin: Negative for wound.  Neurological: Negative for dizziness, tremors, seizures, syncope, facial asymmetry, speech difficulty, weakness, light-headedness, numbness and headaches.  Psychiatric/Behavioral: Positive for agitation, behavioral problems and confusion. Negative for self-injury, sleep disturbance and suicidal ideas. The patient is nervous/anxious.     Immunization History  Administered Date(s) Administered   Influenza Inj Mdck Quad Pf 05/18/2019   Influenza Split 04/04/2012, 05/03/2013, 05/10/2015   Influenza Whole 04/18/2008, 06/04/2009, 04/03/2010, 03/30/2011   Influenza, High Dose Seasonal PF 04/20/2018   Influenza,inj,Quad PF,6+ Mos 03/26/2017   Influenza-Unspecified 03/26/2010, 04/13/2012, 05/08/2013, 05/14/2014, 06/03/2014, 05/13/2015, 04/27/2016, 04/29/2016   Moderna SARS-COVID-2 Vaccination 09/05/2019, 10/03/2019   Pneumococcal Conjugate-13 03/05/2014   Pneumococcal Polysaccharide-23 06/04/2009   Pneumococcal-Unspecified 03/13/2009   Td 03/13/2009, 04/13/2012   Zoster 03/21/2010   Pertinent  Health Maintenance Due  Topic Date Due   INFLUENZA VACCINE  03/03/2020   PNA vac Low Risk Adult  Completed   DEXA SCAN  Discontinued   Fall Risk  01/11/2020 12/07/2012  Falls in the past year? 0 Yes  Number falls in past yr: 0 2 or more  Injury with Fall? 0 -  Risk Factor Category  - High Fall Risk  Risk for fall due to : - Impaired balance/gait   Functional Status Survey:    Vitals:   06/13/20 1545  Weight: 153 lb 12.8 oz (69.8 kg)   Body mass index is 28.13 kg/m.  Wt Readings from Last 3 Encounters:  06/13/20 153 lb 12.8 oz (69.8 kg)  05/06/20 155 lb 9.6 oz (70.6 kg)  03/22/20 159 lb 3.2 oz (72.2 kg)    Physical Exam Vitals and nursing  note reviewed.  Constitutional:      General: She is not in acute distress.    Appearance: She is not diaphoretic.  HENT:     Head: Normocephalic and atraumatic.     Mouth/Throat:     Mouth: Mucous membranes are moist.     Pharynx: Oropharynx is clear.  Eyes:     Conjunctiva/sclera: Conjunctivae normal.     Pupils: Pupils are equal, round, and reactive to light.  Neck:     Vascular: No JVD.  Cardiovascular:     Rate and Rhythm: Normal rate and regular rhythm.     Heart sounds: No murmur heard.   Pulmonary:     Effort: Pulmonary effort is normal. No respiratory distress.     Breath sounds: Normal breath sounds. No wheezing.  Abdominal:     General: Bowel sounds are normal. There is no distension.     Palpations: Abdomen is soft.     Tenderness: There is no abdominal tenderness.  Skin:    General: Skin is warm and dry.  Neurological:     Mental Status: She is alert and oriented to person, place, and time.  Psychiatric:        Mood and Affect: Mood normal.     Labs reviewed: Recent Labs    12/18/19 0000 12/18/19 0700  NA 134*  --   K 4.4  --   CL 98*  --   CO2 22  --   BUN 24*  --   CREATININE 0.9  --   CALCIUM  --  9.6   No results for input(s): AST, ALT, ALKPHOS, BILITOT, PROT, ALBUMIN in the last 8760 hours. Recent Labs    12/18/19 0000  WBC 9.1  HGB 11.0*  HCT 33*  PLT 245   Lab Results  Component Value Date   TSH 2.440 09/04/2015   No results found for: HGBA1C No results found for: CHOL, HDL, LDLCALC, LDLDIRECT, TRIG, CHOLHDL  Significant Diagnostic Results in last 30 days:  No results found.  Assessment/Plan 1. Severe persistent asthma without complication Controlled with Pulmicort, Performorist and Dupixent  2. Late onset Alzheimer's dementia without behavioral disturbance (HCC) Moderate and progressing.  Continue Namenda 10 mg bid Continue redirection and re-orientation. Could resume prn vistaril if agitation worsens   3. Essential  hypertension Controlled Continue Benicar 40 mg qd   4. Seasonal allergic rhinitis, unspecified trigger Continue Flonase 2 sprays daily and Singular 10 mg qd   5. Localized edema No current edema. Weight trending down. Continue Lasix 20 mg qd    Family/ staff Communication: nurse   Labs/tests ordered:  NA

## 2020-07-04 ENCOUNTER — Non-Acute Institutional Stay (SKILLED_NURSING_FACILITY): Payer: Medicare Other | Admitting: Adult Health

## 2020-07-04 DIAGNOSIS — G301 Alzheimer's disease with late onset: Secondary | ICD-10-CM

## 2020-07-04 DIAGNOSIS — F325 Major depressive disorder, single episode, in full remission: Secondary | ICD-10-CM

## 2020-07-04 DIAGNOSIS — I10 Essential (primary) hypertension: Secondary | ICD-10-CM

## 2020-07-04 DIAGNOSIS — J4550 Severe persistent asthma, uncomplicated: Secondary | ICD-10-CM | POA: Diagnosis not present

## 2020-07-04 DIAGNOSIS — I120 Hypertensive chronic kidney disease with stage 5 chronic kidney disease or end stage renal disease: Secondary | ICD-10-CM | POA: Diagnosis not present

## 2020-07-04 DIAGNOSIS — N185 Chronic kidney disease, stage 5: Secondary | ICD-10-CM

## 2020-07-04 DIAGNOSIS — F0280 Dementia in other diseases classified elsewhere without behavioral disturbance: Secondary | ICD-10-CM

## 2020-07-05 ENCOUNTER — Encounter: Payer: Self-pay | Admitting: Adult Health

## 2020-07-05 NOTE — Progress Notes (Signed)
Location:  Occupational psychologist of Service:  SNF (31) Provider:  Cindi Carbon, ANP [place] [phone]  Gayland Curry, DO  Patient Care Team: Gayland Curry, DO as PCP - General (Geriatric Medicine) Elsie Stain, MD (Pulmonary Disease)  Extended Emergency Contact Information Primary Emergency Contact: Earlene Plater of [place] Phone: [phone] Mobile Phone: [phone] Relation: Daughter  Code Status:  DNR Goals of care: Advanced Directive information Advanced Directives 07/05/2020  Does Patient Have a Medical Advance Directive? Yes  Type of Advance Directive Out of facility DNR (pink MOST or yellow form)  Does patient want to make changes to medical advance directive? No - Patient declined  Copy of [place] in Chart? -  Pre-existing out of facility DNR order (yellow form or pink MOST form) -     Chief Complaint  Patient presents with  . Medical Management of Chronic Issues    HPI:  Pt is a 86 y.o. female seen today for medical management of chronic diseases.    Dementia: MMSE 22/30, She continues to have increased confusion in the afternoon. She is not combative or aggressive. Seems to have some delusional thoughts.    Asthma: no cough, wheeze, or sob. No recent exacerbations. Has done well since going on Dupixent.   Mood: anxious at times in the afternoon. No s/s of depression. No reports of issues sleeping. Weight with slight trend upward  Wt Readings from Last 3 Encounters:  07/05/20 156 lb 11.2 oz (71.1 kg)  06/13/20 153 lb 12.8 oz (69.8 kg)  05/06/20 155 lb 9.6 oz (70.6 kg)   BP controlled  No issues with swallowing, burning, indigestion, etc  Past Medical History:  Diagnosis Date  . Age-related osteoporosis without current pathological fracture   . Alzheimer's disease ([place])    with late onset  . Arthritis   . Asthma   . Deficiency of other specified B group  vitamins   . Depression   . Environmental allergies    mold  . Fall   . GERD without esophagitis   . Hyperlipidemia   . Hypertension   . Hypertensive kidney disease with CKD (chronic kidney disease)    with stage I through stage 4 CKD   . Memory loss   . Seasonal allergies   . Unspecified osteoarthritis, unspecified site    Past Surgical History:  Procedure Laterality Date  . HIP ARTHROPLASTY  06/25/2011   Procedure: ARTHROPLASTY BIPOLAR HIP;  Surgeon: Laurice Record [place];  Location: [facility];  Service: Orthopedics;  Laterality: Right;  . NASAL SINUS SURGERY    . VAGINAL HYSTERECTOMY      Allergies  Allergen Reactions  . Azithromycin Other (See Comments)    Nausea, weakness   . Aspirin Other (See Comments)    Unknown reaction  . Molds & Smuts   . Septra [Sulfamethoxazole-Trimethoprim]   . Sulfa Antibiotics   . Sulfonamide Derivatives Other (See Comments)    Unknown reaction    Outpatient Encounter Medications as of 07/04/2020  Medication Sig  . acetaminophen (TYLENOL) 500 MG tablet Take 500 mg by mouth in the morning, at noon, and at bedtime.   . budesonide (PULMICORT) 0.5 MG/2ML nebulizer solution Take 0.5 mg by nebulization 2 (two) times daily.  . Calcium Carb-Cholecalciferol (CALCIUM 600-D PO) Take 1 tablet by mouth daily.  . Camphor-Menthol-Methyl Sal (SALONPAS) 3.08-08-08 % PTCH Apply 1 patch topically daily. On in the am and off in the pm  .  Cholecalciferol (VITAMIN D) 2000 units tablet Take 2,000 Units by mouth at bedtime.  Marland Kitchen dextromethorphan (DELSYM) 30 MG/5ML liquid Take 60 mg by mouth every 12 (twelve) hours as needed for cough.  . docusate sodium (COLACE) 100 MG capsule Take 100 mg by mouth at bedtime.   . DUPIXENT 300 MG/2ML SOSY Inject 1 pen/syringe subcutaneously every other week STARTING ON DAY 15  . EPINEPHrine (EPIPEN 2-PAK) 0.3 mg/0.3 mL IJ SOAJ injection Inject 0.3 mg into the muscle as needed for anaphylaxis. Prescribed with dupexient  . FLUoxetine (PROZAC)  20 MG capsule Take 40 mg by mouth daily.   . fluticasone (FLONASE) 50 MCG/ACT nasal spray Place 2 sprays into both nostrils daily. 2 sprays: nasal Special instruction: administer 2 sprays into nostrils daily once  a morning 8 am and 11 am  . formoterol (PERFOROMIST) 20 MCG/2ML nebulizer solution Take 20 mcg by nebulization 2 (two) times daily.  . furosemide (LASIX) 20 MG tablet Take 20 mg by mouth daily.  Marland Kitchen ipratropium-albuterol (DUONEB) 0.5-2.5 (3) MG/3ML SOLN Take 3 mLs by nebulization every 4 (four) hours as needed.  . lactose free nutrition (BOOST) LIQD Take 237 mLs by mouth daily.  . memantine (NAMENDA) 10 MG tablet Take 1 tablet (10 mg total) by mouth 2 (two) times daily.  . montelukast (SINGULAIR) 10 MG tablet Take 1 tablet (10 mg total) by mouth daily.  Marland Kitchen olmesartan (BENICAR) 20 MG tablet Take 40 mg by mouth daily.   Marland Kitchen omeprazole (PRILOSEC) 20 MG capsule Take 20 mg by mouth daily.   Vladimir Faster Glycol-Propyl Glycol (SYSTANE) 0.4-0.3 % GEL ophthalmic gel Place 1 application into both eyes at bedtime.  . polyethylene glycol (MIRALAX / GLYCOLAX) packet Take 17 g by mouth daily as needed.  . triamcinolone cream (KENALOG) 0.1 % Apply 1 application topically as needed.  . vitamin B-12 (CYANOCOBALAMIN) 1000 MCG tablet Take 3,000 mcg by mouth daily.  . ondansetron (ZOFRAN) 4 MG tablet Take 4 mg by mouth every 6 (six) hours as needed for nausea or vomiting.  . [DISCONTINUED] rivaroxaban (XARELTO) 10 MG TABS tablet Take 1 tablet (10 mg total) by mouth daily.   No facility-administered encounter medications on file as of 07/04/2020.    Review of Systems  Constitutional: Negative for activity change, appetite change, chills, diaphoresis, fatigue, fever and unexpected weight change.  HENT: Negative for congestion.   Respiratory: Negative for cough, shortness of breath and wheezing.   Cardiovascular: Negative for chest pain, palpitations and leg swelling.  Gastrointestinal: Negative for abdominal  distention, abdominal pain, constipation and diarrhea.  Genitourinary: Negative for difficulty urinating and dysuria.  Musculoskeletal: Positive for arthralgias and gait problem. Negative for back pain, joint swelling and myalgias.  Skin: Negative for wound.  Neurological: Negative for dizziness, tremors, seizures, syncope, facial asymmetry, speech difficulty, weakness, light-headedness, numbness and headaches.  Psychiatric/Behavioral: Positive for agitation, behavioral problems and confusion. Negative for self-injury, sleep disturbance and suicidal ideas. The patient is nervous/anxious.     Immunization History  Administered Date(s) Administered  . Influenza Inj Mdck Quad Pf 05/18/2019  . Influenza Split 04/04/2012, 05/03/2013, 05/10/2015  . Influenza Whole 04/18/2008, 06/04/2009, 04/03/2010, 03/30/2011  . Influenza, High Dose Seasonal PF 04/20/2018  . Influenza,inj,Quad PF,6+ Mos 03/26/2017  . Influenza-Unspecified 03/26/2010, 04/13/2012, 05/08/2013, 05/14/2014, 06/03/2014, 05/13/2015, 04/27/2016, 04/29/2016  . Moderna SARS-COVID-2 Vaccination 09/05/2019, 10/03/2019  . Pneumococcal Conjugate-13 03/05/2014  . Pneumococcal Polysaccharide-23 06/04/2009  . Pneumococcal-Unspecified 03/13/2009  . Td 03/13/2009, 04/13/2012  . Zoster 03/21/2010   Pertinent  Health Maintenance  Due  Topic Date Due  . INFLUENZA VACCINE  03/03/2020  . PNA vac Low Risk Adult  Completed  . DEXA SCAN  Discontinued   Fall Risk  01/11/2020 12/07/2012  Falls in the past year? 0 Yes  Number falls in past yr: 0 2 or more  Injury with Fall? 0 -  Risk Factor Category  - High Fall Risk  Risk for fall due to : - Impaired balance/gait   Functional Status Survey:    Vitals:   07/05/20 1612  Weight: 156 lb 11.2 oz (71.1 kg)   Body mass index is 28.66 kg/m.  Wt Readings from Last 3 Encounters:  07/05/20 156 lb 11.2 oz (71.1 kg)  06/13/20 153 lb 12.8 oz (69.8 kg)  05/06/20 155 lb 9.6 oz (70.6 kg)    Physical  Exam Vitals and nursing note reviewed.  Constitutional:      General: She is not in acute distress.    Appearance: She is not diaphoretic.  HENT:     Head: Normocephalic and atraumatic.     Mouth/Throat:     Mouth: Mucous membranes are moist.     Pharynx: Oropharynx is clear.  Eyes:     Conjunctiva/sclera: Conjunctivae normal.     Pupils: Pupils are equal, round, and reactive to light.  Neck:     Vascular: No JVD.  Cardiovascular:     Rate and Rhythm: Normal rate and regular rhythm.     Heart sounds: No murmur heard.   Pulmonary:     Effort: Pulmonary effort is normal. No respiratory distress.     Breath sounds: Normal breath sounds. No wheezing.  Abdominal:     General: Bowel sounds are normal. There is no distension.     Palpations: Abdomen is soft.     Tenderness: There is no abdominal tenderness.  Skin:    General: Skin is warm and dry.  Neurological:     General: No focal deficit present.     Mental Status: She is alert.     Comments: Oriented to self. Thinks she is moving away.  Psychiatric:        Mood and Affect: Mood normal.     Labs reviewed: Recent Labs    12/18/19 0000 12/18/19 0700  NA 134*  --   K 4.4  --   CL 98*  --   CO2 22  --   BUN 24*  --   CREATININE 0.9  --   CALCIUM  --  9.6   No results for input(s): AST, ALT, ALKPHOS, BILITOT, PROT, ALBUMIN in the last 8760 hours. Recent Labs    12/18/19 0000  WBC 9.1  HGB 11.0*  HCT 33*  PLT 245   Lab Results  Component Value Date   TSH 2.440 09/04/2015   No results found for: HGBA1C No results found for: CHOL, HDL, LDLCALC, LDLDIRECT, TRIG, CHOLHDL  Significant Diagnostic Results in last 30 days:  No results found.  Assessment/Plan  1. Hypertensive kidney disease with CKD (chronic kidney disease) stage V ([place]) Lab Results  Component Value Date   BUN 24 (A) 12/18/2019   Lab Results  Component Value Date   CREATININE 0.9 12/18/2019  Continue to periodically monitor BMP and avoid  nephrotoxic agents  2. Essential hypertension Controlled Continue Benicar 40 mg qd and lasix 20 mg qd   3. Severe persistent asthma without complication Controlled Continue Dupixent, Pulmicort, and   4. Late onset Alzheimer's dementia without behavioral disturbance ([place]) Progressing over the  past year with more confusion in the afternoon Continue Namenda 10 mg bid   5. Major depressive disorder, single episode, in remission ([place]) Mood is stable, has been on Prozac for many years. Would continue at this time.     Family/ staff Communication: nurse   Labs/tests ordered:  NA

## 2020-08-22 ENCOUNTER — Non-Acute Institutional Stay (SKILLED_NURSING_FACILITY): Payer: Medicare Other | Admitting: Adult Health

## 2020-08-22 DIAGNOSIS — G301 Alzheimer's disease with late onset: Secondary | ICD-10-CM | POA: Diagnosis not present

## 2020-08-22 DIAGNOSIS — K219 Gastro-esophageal reflux disease without esophagitis: Secondary | ICD-10-CM

## 2020-08-22 DIAGNOSIS — I10 Essential (primary) hypertension: Secondary | ICD-10-CM | POA: Diagnosis not present

## 2020-08-22 DIAGNOSIS — J4550 Severe persistent asthma, uncomplicated: Secondary | ICD-10-CM

## 2020-08-22 DIAGNOSIS — M542 Cervicalgia: Secondary | ICD-10-CM

## 2020-08-22 DIAGNOSIS — F0280 Dementia in other diseases classified elsewhere without behavioral disturbance: Secondary | ICD-10-CM | POA: Diagnosis not present

## 2020-08-28 ENCOUNTER — Encounter: Payer: Self-pay | Admitting: Adult Health

## 2020-08-28 NOTE — Progress Notes (Signed)
Location:  Occupational psychologist of Service:  SNF (31) Provider:   Cindi Carbon, ANP [place] [phone]   Gayland Curry, DO  Patient Care Team: Gayland Curry, DO as PCP - General (Geriatric Medicine) Elsie Stain, MD (Pulmonary Disease)  Extended Emergency Contact Information Primary Emergency Contact: Earlene Plater of [place] Phone: [phone] Mobile Phone: [phone] Relation: Daughter  Code Status:  DNR Goals of care: Advanced Directive information Advanced Directives 07/05/2020  Does Patient Have a Medical Advance Directive? Yes  Type of Advance Directive Out of facility DNR (pink MOST or yellow form)  Does patient want to make changes to medical advance directive? No - Patient declined  Copy of [place] in Chart? -  Pre-existing out of facility DNR order (yellow form or pink MOST form) -     Chief Complaint  Patient presents with  . Medical Management of Chronic Issues    HPI:  Pt is a 87 y.o. female seen today for medical management of chronic diseases.   Ms. Preuss has progressive dementia and resides in the skilled care unit.  Intermittently in the evening she can become restless delusions.  She has as needed order for Vistaril which is helpful but has not been used often.  Her blood pressure is not well controlled and she is not shortness of breath or edema.  She has a history of asthma that has been difficult to control but is now well controlled on her current regimen.  She is not having any cough or wheeze and has not needed as needed albuterol. She is on PPI therapy and is not having any burning or indigestion.  Past Medical History:  Diagnosis Date  . Age-related osteoporosis without current pathological fracture   . Alzheimer's disease ([place])    with late onset  . Arthritis   . Asthma   . Deficiency of other specified B group vitamins   . Depression   .  Environmental allergies    mold  . Fall   . GERD without esophagitis   . Hyperlipidemia   . Hypertension   . Hypertensive kidney disease with CKD (chronic kidney disease)    with stage I through stage 4 CKD   . Memory loss   . Seasonal allergies   . Unspecified osteoarthritis, unspecified site    Past Surgical History:  Procedure Laterality Date  . HIP ARTHROPLASTY  06/25/2011   Procedure: ARTHROPLASTY BIPOLAR HIP;  Surgeon: Laurice Record [place];  Location: [facility] ORS;  Service: Orthopedics;  Laterality: Right;  . NASAL SINUS SURGERY    . VAGINAL HYSTERECTOMY      Allergies  Allergen Reactions  . Azithromycin Other (See Comments)    Nausea, weakness   . Aspirin Other (See Comments)    Unknown reaction  . Molds & Smuts   . Septra [Sulfamethoxazole-Trimethoprim]   . Sulfa Antibiotics   . Sulfonamide Derivatives Other (See Comments)    Unknown reaction    Outpatient Encounter Medications as of 08/22/2020  Medication Sig  . hydrOXYzine (ATARAX/VISTARIL) 25 MG tablet Take 25 mg by mouth 2 (two) times daily as needed.  Marland Kitchen acetaminophen (TYLENOL) 500 MG tablet Take 500 mg by mouth in the morning, at noon, and at bedtime.   . budesonide (PULMICORT) 0.5 MG/2ML nebulizer solution Take 0.5 mg by nebulization 2 (two) times daily.  . Calcium Carb-Cholecalciferol (CALCIUM 600-D PO) Take 1 tablet by mouth daily.  . Camphor-Menthol-Methyl Sal (SALONPAS)  3.08-08-08 % PTCH Apply 1 patch topically daily. On in the am and off in the pm  . Cholecalciferol (VITAMIN D) 2000 units tablet Take 2,000 Units by mouth at bedtime.  Marland Kitchen dextromethorphan (DELSYM) 30 MG/5ML liquid Take 60 mg by mouth every 12 (twelve) hours as needed for cough.  . docusate sodium (COLACE) 100 MG capsule Take 100 mg by mouth at bedtime.   . DUPIXENT 300 MG/2ML SOSY Inject 1 pen/syringe subcutaneously every other week STARTING ON DAY 15  . EPINEPHrine (EPIPEN 2-PAK) 0.3 mg/0.3 mL IJ SOAJ injection Inject 0.3 mg into the muscle as needed  for anaphylaxis. Prescribed with dupexient  . FLUoxetine (PROZAC) 20 MG capsule Take 40 mg by mouth daily.   . fluticasone (FLONASE) 50 MCG/ACT nasal spray Place 2 sprays into both nostrils daily. 2 sprays: nasal Special instruction: administer 2 sprays into nostrils daily once  a morning 8 am and 11 am  . formoterol (PERFOROMIST) 20 MCG/2ML nebulizer solution Take 20 mcg by nebulization 2 (two) times daily.  . furosemide (LASIX) 20 MG tablet Take 20 mg by mouth daily.  Marland Kitchen ipratropium-albuterol (DUONEB) 0.5-2.5 (3) MG/3ML SOLN Take 3 mLs by nebulization every 4 (four) hours as needed.  . lactose free nutrition (BOOST) LIQD Take 237 mLs by mouth daily.  . memantine (NAMENDA) 10 MG tablet Take 1 tablet (10 mg total) by mouth 2 (two) times daily.  . montelukast (SINGULAIR) 10 MG tablet Take 1 tablet (10 mg total) by mouth daily.  Marland Kitchen olmesartan (BENICAR) 20 MG tablet Take 40 mg by mouth daily.   Marland Kitchen omeprazole (PRILOSEC) 20 MG capsule Take 20 mg by mouth daily.   . ondansetron (ZOFRAN) 4 MG tablet Take 4 mg by mouth every 6 (six) hours as needed for nausea or vomiting.  Vladimir Faster Glycol-Propyl Glycol (SYSTANE) 0.4-0.3 % GEL ophthalmic gel Place 1 application into both eyes at bedtime.  . polyethylene glycol (MIRALAX / GLYCOLAX) packet Take 17 g by mouth daily as needed.  . triamcinolone cream (KENALOG) 0.1 % Apply 1 application topically as needed.  . vitamin B-12 (CYANOCOBALAMIN) 1000 MCG tablet Take 3,000 mcg by mouth daily.   No facility-administered encounter medications on file as of 08/22/2020.    Review of Systems  Constitutional: Negative for activity change, appetite change, chills, diaphoresis, fatigue, fever and unexpected weight change.  HENT: Negative for congestion.   Respiratory: Negative for cough, shortness of breath and wheezing.   Cardiovascular: Negative for chest pain, palpitations and leg swelling.  Gastrointestinal: Negative for abdominal distention, abdominal pain,  constipation and diarrhea.  Genitourinary: Negative for difficulty urinating and dysuria.  Musculoskeletal: Positive for gait problem. Negative for arthralgias, back pain, joint swelling and myalgias.       H/o neck pain   Skin: Negative for pallor, rash and wound.  Neurological: Negative for dizziness, tremors, seizures, syncope, facial asymmetry, speech difficulty, weakness, light-headedness, numbness and headaches.  Psychiatric/Behavioral: Positive for agitation and confusion. Negative for behavioral problems.       Delusions    Immunization History  Administered Date(s) Administered  . Influenza Inj Mdck Quad Pf 05/18/2019  . Influenza Split 04/04/2012, 05/03/2013, 05/10/2015  . Influenza Whole 04/18/2008, 06/04/2009, 04/03/2010, 03/30/2011  . Influenza, High Dose Seasonal PF 04/20/2018, 05/24/2020  . Influenza,inj,Quad PF,6+ Mos 03/26/2017  . Influenza-Unspecified 03/26/2010, 04/13/2012, 05/08/2013, 05/14/2014, 06/03/2014, 05/13/2015, 04/27/2016, 04/29/2016  . Moderna Sars-Covid-2 Vaccination 09/05/2019, 10/03/2019  . Pneumococcal Conjugate-13 03/05/2014  . Pneumococcal Polysaccharide-23 06/04/2009  . Pneumococcal-Unspecified 03/13/2009  . Td 03/13/2009, 04/13/2012  .  Zoster 03/21/2010   Pertinent  Health Maintenance Due  Topic Date Due  . INFLUENZA VACCINE  Completed  . PNA vac Low Risk Adult  Completed  . DEXA SCAN  Discontinued   Fall Risk  01/11/2020 12/07/2012  Falls in the past year? 0 Yes  Number falls in past yr: 0 2 or more  Injury with Fall? 0 -  Risk Factor Category  - High Fall Risk  Risk for fall due to : - Impaired balance/gait   Functional Status Survey:    Vitals:   08/28/20 1537  Weight: 155 lb 12.8 oz (70.7 kg)   Body mass index is 28.5 kg/m.  Wt Readings from Last 3 Encounters:  08/28/20 155 lb 12.8 oz (70.7 kg)  07/05/20 156 lb 11.2 oz (71.1 kg)  06/13/20 153 lb 12.8 oz (69.8 kg)   Cell Physical Exam Vitals and nursing note reviewed.   Constitutional:      General: She is not in acute distress.    Appearance: She is not diaphoretic.  HENT:     Head: Normocephalic and atraumatic.  Neck:     Vascular: No JVD.  Cardiovascular:     Rate and Rhythm: Normal rate and regular rhythm.     Heart sounds: No murmur heard.   Pulmonary:     Effort: Pulmonary effort is normal. No respiratory distress.     Breath sounds: Normal breath sounds. No wheezing.  Musculoskeletal:     Right lower leg: No edema.     Left lower leg: No edema.  Skin:    General: Skin is warm and dry.  Neurological:     General: No focal deficit present.     Mental Status: She is alert. Mental status is at baseline.     Labs reviewed: Recent Labs    12/18/19 0000 12/18/19 0700  NA 134*  --   K 4.4  --   CL 98*  --   CO2 22  --   BUN 24*  --   CREATININE 0.9  --   CALCIUM  --  9.6   No results for input(s): AST, ALT, ALKPHOS, BILITOT, PROT, ALBUMIN in the last 8760 hours. Recent Labs    12/18/19 0000  WBC 9.1  HGB 11.0*  HCT 33*  PLT 245   Lab Results  Component Value Date   TSH 2.440 09/04/2015   No results found for: HGBA1C No results found for: CHOL, HDL, LDLCALC, LDLDIRECT, TRIG, CHOLHDL  Significant Diagnostic Results in last 30 days:  No results found.  Assessment/Plan 1. Severe persistent asthma without complication No current symptoms Doing well on Brovana, Pulmicort and Dupixent   2. Gastro-esophageal reflux disease without esophagitis No current symptoms Continues on Prilosec 20 mg bid would not taper due to hx of asthma/cough  3. Late onset Alzheimer's dementia without behavioral disturbance ([place]) Progressive decline in cognition and physical function c/w the disease. Continue supportive care in the skilled environment. Continue Namenda 10 mg bid and prn vistaril   4. Essential hypertension Controlled Continue Benicar 40 mg qd   5. Neck pain Well controlled with tylenol tid and salonpas patch      Family/ staff Communication: nurse  Labs/tests ordered:  NA

## 2020-09-06 ENCOUNTER — Telehealth: Payer: Self-pay | Admitting: Primary Care

## 2020-09-06 NOTE — Telephone Encounter (Signed)
Pharmacy team will initiate new PA or appeal with updated OV chart notes.

## 2020-09-06 NOTE — Telephone Encounter (Signed)
We have not initiated any Pa's for this patient. Is she an active patient here? Most recent notes from our office were from 2020.

## 2020-09-06 NOTE — Telephone Encounter (Signed)
It could be that the med was denied due to last time pt was seen at the office. Since it has been more than a year since pt was last seen, the refill of med was probably denied.  Called and spoke with pt's daughter letting her know info from pharmacy and stated to her that we needed to get pt in for an appt. [place] verbalized understanding. appt scheduled for pt. Nothing further needed.

## 2020-09-06 NOTE — Telephone Encounter (Signed)
Called and spoke with pt's daughter who stated that they received a denial for pt's dupixent.   Routing to pharmacy team for them to further review. Please advise.

## 2020-09-10 ENCOUNTER — Encounter: Payer: Self-pay | Admitting: Primary Care

## 2020-09-10 ENCOUNTER — Telehealth: Payer: Self-pay | Admitting: Primary Care

## 2020-09-10 ENCOUNTER — Ambulatory Visit (INDEPENDENT_AMBULATORY_CARE_PROVIDER_SITE_OTHER): Payer: Medicare Other | Admitting: Primary Care

## 2020-09-10 ENCOUNTER — Other Ambulatory Visit: Payer: Self-pay

## 2020-09-10 DIAGNOSIS — J4550 Severe persistent asthma, uncomplicated: Secondary | ICD-10-CM | POA: Diagnosis not present

## 2020-09-10 MED ORDER — PREDNISONE 10 MG PO TABS: ORAL_TABLET | ORAL | 0 refills | Status: DC

## 2020-09-10 NOTE — Telephone Encounter (Signed)
Submitted a Prior Authorization request to [facility] for [place] 300mg  syringes via Cover My Meds. Will update once we receive a response.   [place] did have formulary change( preferred are Fasenra/Nucala),but we have had success with appealing for continuation of therapy.

## 2020-09-10 NOTE — Progress Notes (Signed)
@Patient  ID: [name], female    DOB: Apr 25, 1924, 87 y.o.   MRN: [id number]  Chief Complaint  Patient presents with  . Follow-up    Insurance denied [place]    Referring provider: Gayland Curry, DO  HPI: 87 year old feamle, never smoked. PMH significant for HTN, allergic rhinitis, moderate-severe persistent asthma, VCD, GERD, osteoporosis, chronic kidney disease. Former patient of Dr. Lake Bells. Maintained on Pulmicort/Perforomist nebulizer's twice a day and Dupixent 300mg  Q14 days.   09/10/2020  Patient presents today for regular follow-up. Lives at [place]. Patient states that she is doing well. No current respiratory complaints. No recent hospitalizations or bronchitis requiring abx/steriods. Since starting dupixent her breathing, wheezing and cough have imropved. Her lungs sounds are also better. Needing updated PA for Dupixent, started in the summer 2019. Pharmacy is [facility]. She gets injections from nurse staff. CAT score 9/40.   Allergies  Allergen Reactions  . Azithromycin Other (See Comments)    Nausea, weakness   . Aspirin Other (See Comments)    Unknown reaction  . Molds & Smuts   . Septra [Sulfamethoxazole-Trimethoprim]   . Sulfa Antibiotics   . Sulfonamide Derivatives Other (See Comments)    Unknown reaction    Immunization History  Administered Date(s) Administered  . Influenza Inj Mdck Quad Pf 05/18/2019  . Influenza Split 04/04/2012, 05/03/2013, 05/10/2015  . Influenza Whole 04/18/2008, 06/04/2009, 04/03/2010, 03/30/2011  . Influenza, High Dose Seasonal PF 04/20/2018, 05/24/2020  . Influenza,inj,Quad PF,6+ Mos 03/26/2017  . Influenza-Unspecified 03/26/2010, 04/13/2012, 05/08/2013, 05/14/2014, 06/03/2014, 05/13/2015, 04/27/2016, 04/29/2016  . Moderna SARS-COV2 Booster Vaccination 06/13/2020  . Moderna Sars-Covid-2 Vaccination 09/05/2019, 10/03/2019  . Pneumococcal Conjugate-13 03/05/2014  . Pneumococcal Polysaccharide-23 06/04/2009  .  Pneumococcal-Unspecified 03/13/2009  . Td 03/13/2009, 04/13/2012  . Zoster 03/21/2010    Past Medical History:  Diagnosis Date  . Age-related osteoporosis without current pathological fracture   . Alzheimer's disease ([place])    with late onset  . Arthritis   . Asthma   . Deficiency of other specified B group vitamins   . Depression   . Environmental allergies    mold  . Fall   . GERD without esophagitis   . Hyperlipidemia   . Hypertension   . Hypertensive kidney disease with CKD (chronic kidney disease)    with stage I through stage 4 CKD   . Memory loss   . Seasonal allergies   . Unspecified osteoarthritis, unspecified site     Tobacco History: Social History   Tobacco Use  Smoking Status Never Smoker  Smokeless Tobacco Never Used   Counseling given: Not Answered   Outpatient Medications Prior to Visit  Medication Sig Dispense Refill  . acetaminophen (TYLENOL) 500 MG tablet Take 500 mg by mouth in the morning, at noon, and at bedtime.     . budesonide (PULMICORT) 0.5 MG/2ML nebulizer solution Take 0.5 mg by nebulization 2 (two) times daily.    . Calcium Carb-Cholecalciferol (CALCIUM 600-D PO) Take 1 tablet by mouth daily.    . Camphor-Menthol-Methyl Sal (SALONPAS) 3.08-08-08 % PTCH Apply 1 patch topically daily. On in the am and off in the pm    . Cholecalciferol (VITAMIN D) 2000 units tablet Take 2,000 Units by mouth at bedtime.    Marland Kitchen dextromethorphan (DELSYM) 30 MG/5ML liquid Take 60 mg by mouth every 12 (twelve) hours as needed for cough.    . docusate sodium (COLACE) 100 MG capsule Take 100 mg by mouth at bedtime.     . DUPIXENT  300 MG/2ML SOSY Inject 1 pen/syringe subcutaneously every other week STARTING ON DAY 15 4 mL 6  . EPINEPHrine 0.3 mg/0.3 mL IJ SOAJ injection Inject 0.3 mg into the muscle as needed for anaphylaxis. Prescribed with dupexient    . FLUoxetine (PROZAC) 20 MG capsule Take 40 mg by mouth daily.    . fluticasone (FLONASE) 50 MCG/ACT nasal spray  Place 2 sprays into both nostrils daily. 8 am and 11 am    . formoterol (PERFOROMIST) 20 MCG/2ML nebulizer solution Take 20 mcg by nebulization 2 (two) times daily.    . furosemide (LASIX) 20 MG tablet Take 20 mg by mouth daily.    . hydrOXYzine (ATARAX/VISTARIL) 25 MG tablet Take 25 mg by mouth 2 (two) times daily as needed.    Marland Kitchen ipratropium-albuterol (DUONEB) 0.5-2.5 (3) MG/3ML SOLN Take 3 mLs by nebulization every 6 (six) hours as needed.    . lactose free nutrition (BOOST) LIQD Take 237 mLs by mouth daily.    . memantine (NAMENDA) 10 MG tablet Take 1 tablet (10 mg total) by mouth 2 (two) times daily. 60 tablet 11  . montelukast (SINGULAIR) 10 MG tablet Take 1 tablet (10 mg total) by mouth daily. 30 tablet 6  . olmesartan (BENICAR) 20 MG tablet Take 20 mg by mouth daily. Hold for SBP <120    . omeprazole (PRILOSEC) 20 MG capsule Take 20 mg by mouth 2 (two) times daily.    . ondansetron (ZOFRAN) 4 MG tablet Take 4 mg by mouth every 8 (eight) hours as needed for nausea or vomiting.    Vladimir Faster Glycol-Propyl Glycol (SYSTANE) 0.4-0.3 % GEL ophthalmic gel Place 1 application into both eyes at bedtime.    . polyethylene glycol (MIRALAX / GLYCOLAX) packet Take 17 g by mouth daily as needed.    . triamcinolone cream (KENALOG) 0.1 % Apply 1 application topically as needed.    . vitamin B-12 (CYANOCOBALAMIN) 1000 MCG tablet Take 3,000 mcg by mouth daily.     No facility-administered medications prior to visit.    Review of Systems  Review of Systems  Respiratory: Negative for cough.    Physical Exam  BP 120/70 (BP Location: Left Arm, Cuff Size: Normal)   Pulse 61   Temp 97.8 F (36.6 C) (Oral)   Ht 5\' 4"  (1.626 m)   Wt 152 lb 1.6 oz (69 kg)   SpO2 97%   BMI 26.11 kg/m  Physical Exam Constitutional:      Appearance: Normal appearance.  Cardiovascular:     Rate and Rhythm: Normal rate and regular rhythm.  Pulmonary:     Effort: Pulmonary effort is normal.     Breath sounds:  Wheezing present.  Musculoskeletal:     Comments: WC bound  Neurological:     General: No focal deficit present.     Mental Status: She is alert and oriented to person, place, and time.  Psychiatric:        Mood and Affect: Mood normal.        Behavior: Behavior normal.        Thought Content: Thought content normal.        Judgment: Judgment normal.      Lab Results:  CBC    Component Value Date/Time   WBC 9.1 12/18/2019 0000   WBC 7.2 02/02/2018 1031   RBC 3.65 (A) 12/18/2019 0700   HGB 11.0 (A) 12/18/2019 0000   HCT 33 (A) 12/18/2019 0000   PLT 245 12/18/2019 0000  MCV 89.8 02/02/2018 1031   MCH 30.3 07/24/2017 1822   MCHC 34.4 02/02/2018 1031   RDW 15.3 02/02/2018 1031   LYMPHSABS 0.9 02/02/2018 1031   MONOABS 0.5 02/02/2018 1031   EOSABS 0.7 02/02/2018 1031   BASOSABS 0.0 02/02/2018 1031    BMET    Component Value Date/Time   NA 134 (A) 12/18/2019 0000   K 4.4 12/18/2019 0000   CL 98 (A) 12/18/2019 0000   CO2 22 12/18/2019 0000   GLUCOSE 90 02/02/2018 1031   BUN 24 (A) 12/18/2019 0000   CREATININE 0.9 12/18/2019 0000   CREATININE 1.14 02/02/2018 1031   CALCIUM 9.6 12/18/2019 0700   GFRNONAA 59 (L) 07/24/2017 1822   GFRAA >60 07/24/2017 1822    BNP No results found for: BNP  ProBNP    Component Value Date/Time   PROBNP 37.0 02/02/2018 1031    Imaging: No results found.   Assessment & Plan:   Severe persistent asthma without complication - Severe persistent eosinophilic asthma with recurrent exacerbations  - Overall, she is well controlled on present treatment, she had scattered wheezing on exam today. We will send in short course of prednisone 20mg  x 5 days  - Continue Pulmicort and Perforomist nebulizers twice daily - Continue Dupixent 300mg  q 14days (We will initiate PA and it was approved through 08/02/21) - Continue Singulair 10mg  qhs  - FU 6 months with new pulmonary MD (either Julia Stalls, Hunsucker or dewald)   Martyn Ehrich,  NP 09/23/2020

## 2020-09-10 NOTE — Patient Instructions (Addendum)
Nice seeing you today [name]  Continue Pulmicort and Perforomist nebulizer twice a day Use ipratropium-albuterol every 6 hours for shortness of breath/wheezing  I will work on getting Dupixent covered with our pharmacist, if not we may need to try similar one that is covered call Berna Bue   Rx: Prednisone 20mg  daily x 5 days   Follow-up: 6 months with new pulmonar MD (either Desai, Hunsucker or dewald)

## 2020-09-10 NOTE — Telephone Encounter (Signed)
This patients Dupixent was denied. She has been on it since 2019 with much success. She lives at [place] and gets home injections. Can you help me?

## 2020-09-11 ENCOUNTER — Telehealth: Payer: Self-pay | Admitting: Primary Care

## 2020-09-11 NOTE — Telephone Encounter (Signed)
Called and spoke with [facility] and she is aware of the dosing of the prednisone per last ov on 02/08.  Nothing further is needed.

## 2020-09-11 NOTE — Telephone Encounter (Signed)
Received response [facility] that there was a Dupixent denial on 08/24/20. Maybe Pharmacy initiated PA renewal.    Plan will fax denial to office, so we can submit an urgent appeal.

## 2020-09-11 NOTE — Telephone Encounter (Signed)
Message above is nurse asking for clarification for patient's Prednisone prescription. Advised nurse that I will route message to triage.   Requesting a call back at [phone]

## 2020-09-12 NOTE — Telephone Encounter (Signed)
Finally received denial letter from 08/24/20- Provider on the denial was Dr. Osborne Casco? This is why our office never received notification.   Received a fax regarding Prior Authorization from [facility] for [place]. Authorization has been DENIED because patient has not tried 1 of the formulary preferred: [place] or Nucala.  Phone# [phone]  Pharmacy team will submit appeal

## 2020-09-13 NOTE — Telephone Encounter (Signed)
Faxed Dupixent appeal to [facility] - marked as expedited request. Should receive response in 3 business days. Will f/u on Wednesday, 09/18/20  Fax:[phone] Phone: [phone]  Knox Saliva, PharmD, MPH Clinical Pharmacist (Rheumatology and Pulmonology)

## 2020-09-18 NOTE — Telephone Encounter (Signed)
Patient's Dupixent PA denial was overturned. Dupixent is approved 09/12/20 through 08/02/21  Ref # [id number]  Knox Saliva, PharmD, MPH Clinical Pharmacist (Rheumatology and Pulmonology)

## 2020-09-19 ENCOUNTER — Non-Acute Institutional Stay (SKILLED_NURSING_FACILITY): Payer: Medicare Other | Admitting: Adult Health

## 2020-09-19 ENCOUNTER — Encounter: Payer: Self-pay | Admitting: Adult Health

## 2020-09-19 DIAGNOSIS — R55 Syncope and collapse: Secondary | ICD-10-CM

## 2020-09-19 DIAGNOSIS — I10 Essential (primary) hypertension: Secondary | ICD-10-CM

## 2020-09-19 DIAGNOSIS — R6889 Other general symptoms and signs: Secondary | ICD-10-CM

## 2020-09-19 DIAGNOSIS — N1832 Chronic kidney disease, stage 3b: Secondary | ICD-10-CM | POA: Diagnosis not present

## 2020-09-19 DIAGNOSIS — F0281 Dementia in other diseases classified elsewhere with behavioral disturbance: Secondary | ICD-10-CM | POA: Diagnosis not present

## 2020-09-19 DIAGNOSIS — G301 Alzheimer's disease with late onset: Secondary | ICD-10-CM | POA: Diagnosis not present

## 2020-09-19 DIAGNOSIS — J4550 Severe persistent asthma, uncomplicated: Secondary | ICD-10-CM

## 2020-09-19 NOTE — Progress Notes (Unsigned)
Location:  [facility] Room Number: 121-A Place of Service:  SNF [phone]) Provider:  Cindi Carbon, NP  Patient Care Team: Gayland Curry, DO as PCP - General (Geriatric Medicine) Elsie Stain, MD (Pulmonary Disease)  Extended Emergency Contact Information Primary Emergency Contact: Earlene Plater of [place] Phone: [phone] Mobile Phone: [phone] Relation: Daughter  Code Status:  DNR  Goals of care: Advanced Directive information Advanced Directives 09/19/2020  Does Patient Have a Medical Advance Directive? Yes  Type of Paramedic of [place];Living will;Out of facility DNR (pink MOST or yellow form)  Does patient want to make changes to medical advance directive? No - Patient declined  Copy of [place] in Chart? Yes - validated most recent copy scanned in chart (See row information)  Pre-existing out of facility DNR order (yellow form or pink MOST form) Yellow form placed in chart (order not valid for inpatient use)     Chief Complaint  Patient presents with  . Medical Management of Chronic Issues    Routine Visit     HPI:  Pt is a 87 y.o. female seen today for medical management of chronic diseases.    The nurse reports she had a vagal episode with bradycardia upon standing in the stand up lift after a shower on 09/16/20.  BP 102/52 and pulse 40.  She briefly passed out but returned to normal.   BP now 124/66.  Pulse 64.  Other bps in review or matrix have ranged 562-130'Q systolic  She denies any dizziness, sob or CP  She has periods of confusion and delusional thinking in the afternoon.  Sleepy in the morning hrs.   Cold all the time. Has a heater in her room, gloves on, and multiple blankets  Just finished a course of prednisone because she missed her Dupexient due to waiting on ins auth. No wheezing or cough are noted.   Past Medical History:   Diagnosis Date  . Age-related osteoporosis without current pathological fracture   . Alzheimer's disease ([place])    with late onset  . Arthritis   . Asthma   . Deficiency of other specified B group vitamins   . Depression   . Environmental allergies    mold  . Fall   . GERD without esophagitis   . Hyperlipidemia   . Hypertension   . Hypertensive kidney disease with CKD (chronic kidney disease)    with stage I through stage 4 CKD   . Memory loss   . Seasonal allergies   . Unspecified osteoarthritis, unspecified site    Past Surgical History:  Procedure Laterality Date  . HIP ARTHROPLASTY  06/25/2011   Procedure: ARTHROPLASTY BIPOLAR HIP;  Surgeon: Laurice Record [place];  Location: [facility];  Service: Orthopedics;  Laterality: Right;  . NASAL SINUS SURGERY    . VAGINAL HYSTERECTOMY      Allergies  Allergen Reactions  . Azithromycin Other (See Comments)    Nausea, weakness   . Aspirin Other (See Comments)    Unknown reaction  . Molds & Smuts   . Septra [Sulfamethoxazole-Trimethoprim]   . Sulfa Antibiotics   . Sulfonamide Derivatives Other (See Comments)    Unknown reaction    Outpatient Encounter Medications as of 09/19/2020  Medication Sig  . acetaminophen (TYLENOL) 500 MG tablet Take 500 mg by mouth in the morning, at noon, and at bedtime.   . budesonide (PULMICORT) 0.5 MG/2ML nebulizer solution Take 0.5 mg by  nebulization 2 (two) times daily.  . Calcium Carb-Cholecalciferol (CALCIUM 600-D PO) Take 1 tablet by mouth daily.  . Camphor-Menthol-Methyl Sal (SALONPAS) 3.08-08-08 % PTCH Apply 1 patch topically daily. On in the am and off in the pm  . Cholecalciferol (VITAMIN D) 2000 units tablet Take 2,000 Units by mouth at bedtime.  Marland Kitchen dextromethorphan (DELSYM) 30 MG/5ML liquid Take 60 mg by mouth every 12 (twelve) hours as needed for cough.  . docusate sodium (COLACE) 100 MG capsule Take 100 mg by mouth at bedtime.   . DUPIXENT 300 MG/2ML SOSY Inject 1 pen/syringe subcutaneously  every other week STARTING ON DAY 15  . EPINEPHrine 0.3 mg/0.3 mL IJ SOAJ injection Inject 0.3 mg into the muscle as needed for anaphylaxis. Prescribed with dupexient  . FLUoxetine (PROZAC) 20 MG capsule Take 40 mg by mouth daily.  . fluticasone (FLONASE) 50 MCG/ACT nasal spray Place 2 sprays into both nostrils daily. 8 am and 11 am  . formoterol (PERFOROMIST) 20 MCG/2ML nebulizer solution Take 20 mcg by nebulization 2 (two) times daily.  . furosemide (LASIX) 20 MG tablet Take 20 mg by mouth daily.  . hydrOXYzine (ATARAX/VISTARIL) 25 MG tablet Take 25 mg by mouth 2 (two) times daily as needed.  Marland Kitchen ipratropium-albuterol (DUONEB) 0.5-2.5 (3) MG/3ML SOLN Take 3 mLs by nebulization every 6 (six) hours as needed.  . lactose free nutrition (BOOST) LIQD Take 237 mLs by mouth daily.  . memantine (NAMENDA) 10 MG tablet Take 1 tablet (10 mg total) by mouth 2 (two) times daily.  . montelukast (SINGULAIR) 10 MG tablet Take 1 tablet (10 mg total) by mouth daily.  Marland Kitchen olmesartan (BENICAR) 20 MG tablet Take 20 mg by mouth daily. Hold for SBP <120  . omeprazole (PRILOSEC) 20 MG capsule Take 20 mg by mouth 2 (two) times daily.  . ondansetron (ZOFRAN) 4 MG tablet Take 4 mg by mouth every 8 (eight) hours as needed for nausea or vomiting.  Vladimir Faster Glycol-Propyl Glycol (SYSTANE) 0.4-0.3 % GEL ophthalmic gel Place 1 application into both eyes at bedtime.  . polyethylene glycol (MIRALAX / GLYCOLAX) packet Take 17 g by mouth daily as needed.  . triamcinolone cream (KENALOG) 0.1 % Apply 1 application topically as needed.  . vitamin B-12 (CYANOCOBALAMIN) 1000 MCG tablet Take 3,000 mcg by mouth daily.  . [DISCONTINUED] predniSONE (DELTASONE) 10 MG tablet Take 2 tabs daily x 5 days; then 1 tab daily x 5 days  . [DISCONTINUED] rivaroxaban (XARELTO) 10 MG TABS tablet Take 1 tablet (10 mg total) by mouth daily.   No facility-administered encounter medications on file as of 09/19/2020.    Review of Systems  Constitutional:  Negative for activity change, appetite change, chills, diaphoresis, fatigue, fever and unexpected weight change.  HENT: Negative for congestion.   Respiratory: Negative for cough, shortness of breath and wheezing.   Cardiovascular: Negative for chest pain, palpitations and leg swelling.  Gastrointestinal: Negative for abdominal distention, abdominal pain, constipation and diarrhea.  Endocrine: Positive for cold intolerance.  Genitourinary: Negative for difficulty urinating and dysuria.  Musculoskeletal: Positive for gait problem. Negative for arthralgias, back pain, joint swelling and myalgias.  Neurological: Negative for dizziness, tremors, seizures, syncope (x 1), facial asymmetry, speech difficulty, weakness, light-headedness, numbness and headaches.  Psychiatric/Behavioral: Positive for behavioral problems and confusion. Negative for agitation. The patient is nervous/anxious.        Delusions    Immunization History  Administered Date(s) Administered  . Influenza Inj Mdck Quad Pf 05/18/2019  . Influenza Split  04/04/2012, 05/03/2013, 05/10/2015  . Influenza Whole 04/18/2008, 06/04/2009, 04/03/2010, 03/30/2011  . Influenza, High Dose Seasonal PF 04/20/2018, 05/24/2020  . Influenza,inj,Quad PF,6+ Mos 03/26/2017  . Influenza-Unspecified 03/26/2010, 04/13/2012, 05/08/2013, 05/14/2014, 06/03/2014, 05/13/2015, 04/27/2016, 04/29/2016  . Moderna SARS-COV2 Booster Vaccination 06/13/2020  . Moderna Sars-Covid-2 Vaccination 09/05/2019, 10/03/2019  . Pneumococcal Conjugate-13 03/05/2014  . Pneumococcal Polysaccharide-23 06/04/2009  . Pneumococcal-Unspecified 03/13/2009  . Td 03/13/2009, 04/13/2012  . Zoster 03/21/2010   Pertinent  Health Maintenance Due  Topic Date Due  . INFLUENZA VACCINE  Completed  . PNA vac Low Risk Adult  Completed  . DEXA SCAN  Discontinued   Fall Risk  01/11/2020 12/07/2012  Falls in the past year? 0 Yes  Number falls in past yr: 0 2 or more  Injury with Fall? 0 -   Risk Factor Category  - High Fall Risk  Risk for fall due to : - Impaired balance/gait   Functional Status Survey:    Vitals:   09/19/20 1103  BP: 124/66  Pulse: 64  Resp: 15  Temp: 98.1 F (36.7 C)  SpO2: 93%  Weight: 155 lb (70.3 kg)  Height: 5\' 2"  (1.575 m)   Body mass index is 28.35 kg/m.  Wt Readings from Last 3 Encounters:  09/19/20 155 lb (70.3 kg)  09/10/20 152 lb 1.6 oz (69 kg)  08/28/20 155 lb 12.8 oz (70.7 kg)    Physical Exam Vitals and nursing note reviewed.  Constitutional:      General: She is not in acute distress.    Appearance: She is not diaphoretic.     Comments: Sleepy but arouses  HENT:     Head: Normocephalic and atraumatic.  Neck:     Vascular: No JVD.  Cardiovascular:     Rate and Rhythm: Normal rate and regular rhythm.     Heart sounds: No murmur heard.   Pulmonary:     Effort: Pulmonary effort is normal. No respiratory distress.     Breath sounds: Normal breath sounds. No wheezing.  Abdominal:     General: Bowel sounds are normal.     Palpations: Abdomen is soft.  Musculoskeletal:     Cervical back: No rigidity or tenderness.     Right lower leg: No edema.     Left lower leg: No edema.  Lymphadenopathy:     Cervical: No cervical adenopathy.  Skin:    General: Skin is warm and dry.  Neurological:     General: No focal deficit present.     Mental Status: Mental status is at baseline.  Psychiatric:        Mood and Affect: Mood normal.     Labs reviewed: Recent Labs    12/18/19 0000 12/18/19 0700  NA 134*  --   K 4.4  --   CL 98*  --   CO2 22  --   BUN 24*  --   CREATININE 0.9  --   CALCIUM  --  9.6   No results for input(s): AST, ALT, ALKPHOS, BILITOT, PROT, ALBUMIN in the last 8760 hours. Recent Labs    12/18/19 0000  WBC 9.1  HGB 11.0*  HCT 33*  PLT 245   Lab Results  Component Value Date   TSH 2.440 09/04/2015   No results found for: HGBA1C No results found for: CHOL, HDL, LDLCALC, LDLDIRECT, TRIG,  CHOLHDL  Significant Diagnostic Results in last 30 days:  No results found.  Assessment/Plan  1. Severe persistent asthma without complication No exacerbations Dupixient has  been authorized per the nurse and will come in from pharmacy  2. Essential hypertension Controlled Continue Benicar 20 mg qd  3. Cold intolerance Likely due to her age, due for labs so will check TSH  4. Vasovagal syncope Possibly due to orthostatic bp vs heat in the shower.  Will check labs but would otherwise not pursue aggressive workup for this one time event given her age and debility   5. Stage 3b chronic kidney disease ([place]) Continue to periodically monitor BMP and avoid nephrotoxic agents  6. Late onset Alzheimer's dementia with behavioral disturbance (H[facility]) Moderate Continues on Namenda 10 mg bid     Family/ staff Communication: discussed with her nurse Misti  Labs/tests ordered:  CBC BMP TSH

## 2020-09-20 ENCOUNTER — Encounter: Payer: Self-pay | Admitting: Adult Health

## 2020-09-23 ENCOUNTER — Encounter: Payer: Self-pay | Admitting: Internal Medicine

## 2020-09-23 DIAGNOSIS — Z79899 Other long term (current) drug therapy: Secondary | ICD-10-CM | POA: Diagnosis not present

## 2020-09-23 LAB — BASIC METABOLIC PANEL
BUN: 35 — AB (ref 4–21)
CO2: 23 — AB (ref 13–22)
Chloride: 102 (ref 99–108)
Creatinine: 1.1 (ref 0.5–1.1)
Glucose: 80
Potassium: 3.9 (ref 3.4–5.3)
Sodium: 138 (ref 137–147)

## 2020-09-23 LAB — COMPREHENSIVE METABOLIC PANEL: Calcium: 8.7 (ref 8.7–10.7)

## 2020-09-23 LAB — CBC AND DIFFERENTIAL
HCT: 34 — AB (ref 36–46)
Hemoglobin: 11.8 — AB (ref 12.0–16.0)
Platelets: 232 (ref 150–399)
WBC: 7.1

## 2020-09-23 LAB — CBC: RBC: 3.76 — AB (ref 3.87–5.11)

## 2020-09-23 LAB — TSH: TSH: 1.52 (ref 0.41–5.90)

## 2020-09-23 NOTE — Assessment & Plan Note (Addendum)
-   Severe persistent eosinophilic asthma with recurrent exacerbations  - Overall, she is well controlled on present treatment, she had scattered wheezing on exam today. We will send in short course of prednisone 20mg  x 5 days  - Continue Pulmicort and Perforomist nebulizers twice daily - Continue Dupixent 300mg  q 14days (We will initiate PA and it was approved through 08/02/21) - Continue Singulair 10mg  qhs  - FU 6 months with new pulmonary MD (either Desai, Hunsucker or dewald)

## 2020-10-02 ENCOUNTER — Encounter: Payer: Self-pay | Admitting: *Deleted

## 2020-11-08 ENCOUNTER — Encounter: Payer: Self-pay | Admitting: Internal Medicine

## 2020-11-08 ENCOUNTER — Non-Acute Institutional Stay (SKILLED_NURSING_FACILITY): Payer: Medicare Other | Admitting: Internal Medicine

## 2020-11-08 DIAGNOSIS — J4550 Severe persistent asthma, uncomplicated: Secondary | ICD-10-CM

## 2020-11-08 DIAGNOSIS — I10 Essential (primary) hypertension: Secondary | ICD-10-CM

## 2020-11-08 DIAGNOSIS — R600 Localized edema: Secondary | ICD-10-CM | POA: Diagnosis not present

## 2020-11-08 DIAGNOSIS — G301 Alzheimer's disease with late onset: Secondary | ICD-10-CM | POA: Diagnosis not present

## 2020-11-08 DIAGNOSIS — F0281 Dementia in other diseases classified elsewhere with behavioral disturbance: Secondary | ICD-10-CM | POA: Diagnosis not present

## 2020-11-08 DIAGNOSIS — N1832 Chronic kidney disease, stage 3b: Secondary | ICD-10-CM

## 2020-11-08 DIAGNOSIS — K219 Gastro-esophageal reflux disease without esophagitis: Secondary | ICD-10-CM | POA: Diagnosis not present

## 2020-11-08 NOTE — Progress Notes (Signed)
Location:    [facility] Room Number: 121 Place of Service:  SNF [phone]) Provider:  Veleta Miners MD  Virgie Dad, MD  Patient Care Team: [place] Dad, MD as PCP - General (Internal Medicine) Elsie Stain, MD (Pulmonary Disease)  Extended Emergency Contact Information Primary Emergency Contact: Earlene Plater of [place] Phone: [phone] Mobile Phone: [phone] Relation: Daughter  Code Status:  DNR Goals of care: Advanced Directive information Advanced Directives 11/08/2020  Does Patient Have a Medical Advance Directive? Yes  Type of Paramedic of [place];Living will;Out of facility DNR (pink MOST or yellow form)  Does patient want to make changes to medical advance directive? -  Copy of [place] in Chart? Yes - validated most recent copy scanned in chart (See row information)  Pre-existing out of facility DNR order (yellow form or pink MOST form) Yellow form placed in chart (order not valid for inpatient use)     Chief Complaint  Patient presents with  . Medical Management of Chronic Issues    HPI:  Pt is a 87 y.o. female seen today for medical management of chronic diseases.    Patient has a history of Alzheimer's dementia dependent for her ADLs Osteoporosis and knee osteoarthritis CKD stage 3b Severe asthma HLD and hypertension  Patient is doing well the nurses did not have any new concerns.  She has no behavior issues.  Has not had any falls recently.  Her breathing is under control with no shortness of breath or wheezing.  Weight is stable. Patient unable to give me much history due to her dementia but was alert and very pleasant and follows commands.  Past Medical History:  Diagnosis Date  . Age-related osteoporosis without current pathological fracture   . Alzheimer's disease ([place])    with late onset  . Arthritis   . Asthma   . Deficiency of  other specified B group vitamins   . Depression   . Environmental allergies    mold  . Fall   . GERD without esophagitis   . Hyperlipidemia   . Hypertension   . Hypertensive kidney disease with CKD (chronic kidney disease)    with stage I through stage 4 CKD   . Memory loss   . Seasonal allergies   . Unspecified osteoarthritis, unspecified site    Past Surgical History:  Procedure Laterality Date  . HIP ARTHROPLASTY  06/25/2011   Procedure: ARTHROPLASTY BIPOLAR HIP;  Surgeon: Laurice Record [place];  Location: [facility];  Service: Orthopedics;  Laterality: Right;  . NASAL SINUS SURGERY    . VAGINAL HYSTERECTOMY      Allergies  Allergen Reactions  . Azithromycin Other (See Comments)    Nausea, weakness   . Aspirin Other (See Comments)    Unknown reaction  . Molds & Smuts   . Septra [Sulfamethoxazole-Trimethoprim]   . Sulfa Antibiotics   . Sulfonamide Derivatives Other (See Comments)    Unknown reaction    Allergies as of 11/08/2020      Reactions   Azithromycin Other (See Comments)   Nausea, weakness    Aspirin Other (See Comments)   Unknown reaction   Molds & Smuts    Septra [sulfamethoxazole-trimethoprim]    Sulfa Antibiotics    Sulfonamide Derivatives Other (See Comments)   Unknown reaction      Medication List       Accurate as of November 08, 2020 12:20 PM. If you have any  questions, ask your nurse or doctor.        acetaminophen 500 MG tablet Commonly known as: TYLENOL Take 500 mg by mouth in the morning, at noon, and at bedtime.   budesonide 0.5 MG/2ML nebulizer solution Commonly known as: PULMICORT Take 0.5 mg by nebulization 2 (two) times daily.   CALCIUM 600-D PO Take 1 tablet by mouth daily.   dextromethorphan 30 MG/5ML liquid Commonly known as: DELSYM Take 60 mg by mouth every 12 (twelve) hours as needed for cough.   docusate sodium 100 MG capsule Commonly known as: COLACE Take 100 mg by mouth at bedtime.   Dupixent 300 MG/2ML prefilled  syringe Generic drug: dupilumab Inject 1 pen/syringe subcutaneously every other week STARTING ON DAY 15   EPINEPHrine 0.3 mg/0.3 mL Soaj injection Commonly known as: EPI-PEN Inject 0.3 mg into the muscle as needed for anaphylaxis. Prescribed with dupexient   FLUoxetine 20 MG capsule Commonly known as: PROZAC Take 40 mg by mouth daily.   fluticasone 50 MCG/ACT nasal spray Commonly known as: FLONASE Place 2 sprays into both nostrils daily. 8 am and 11 am   furosemide 20 MG tablet Commonly known as: LASIX Take 20 mg by mouth daily.   hydrOXYzine 25 MG tablet Commonly known as: ATARAX/VISTARIL Take 25 mg by mouth 2 (two) times daily as needed.   ipratropium-albuterol 0.5-2.5 (3) MG/3ML Soln Commonly known as: DUONEB Take 3 mLs by nebulization every 6 (six) hours as needed.   lactose free nutrition Liqd Take 237 mLs by mouth daily.   memantine 10 MG tablet Commonly known as: Namenda Take 1 tablet (10 mg total) by mouth 2 (two) times daily.   montelukast 10 MG tablet Commonly known as: SINGULAIR Take 1 tablet (10 mg total) by mouth daily.   olmesartan 20 MG tablet Commonly known as: BENICAR Take 20 mg by mouth daily. Hold for SBP <120   omeprazole 20 MG capsule Commonly known as: PRILOSEC Take 20 mg by mouth 2 (two) times daily.   ondansetron 4 MG tablet Commonly known as: ZOFRAN Take 4 mg by mouth every 8 (eight) hours as needed for nausea or vomiting.   Perforomist 20 MCG/2ML nebulizer solution Generic drug: formoterol Take 20 mcg by nebulization 2 (two) times daily.   polyethylene glycol 17 g packet Commonly known as: MIRALAX / GLYCOLAX Take 17 g by mouth daily as needed.   Salonpas 3.08-08-08 % Ptch Generic drug: Camphor-Menthol-Methyl Sal Apply 1 patch topically daily. On in the am and off in the pm   Systane 0.4-0.3 % Gel ophthalmic gel Generic drug: Polyethyl Glycol-Propyl Glycol Place 1 application into both eyes at bedtime.   triamcinolone cream 0.1  % Commonly known as: KENALOG Apply 1 application topically as needed.   vitamin B-12 1000 MCG tablet Commonly known as: CYANOCOBALAMIN Take 3,000 mcg by mouth daily.   Vitamin D 50 MCG (2000 UT) tablet Take 2,000 Units by mouth at bedtime.       Review of Systems  Unable to perform ROS: Dementia    Immunization History  Administered Date(s) Administered  . Influenza Inj Mdck Quad Pf 05/18/2019  . Influenza Split 04/04/2012, 05/03/2013, 05/10/2015  . Influenza Whole 04/18/2008, 06/04/2009, 04/03/2010, 03/30/2011  . Influenza, High Dose Seasonal PF 04/20/2018, 05/24/2020  . Influenza,inj,Quad PF,6+ Mos 03/26/2017  . Influenza-Unspecified 03/26/2010, 04/13/2012, 05/08/2013, 05/14/2014, 06/03/2014, 05/13/2015, 04/27/2016, 04/29/2016  . Moderna SARS-COV2 Booster Vaccination 06/13/2020  . Moderna Sars-Covid-2 Vaccination 09/05/2019, 10/03/2019  . Pneumococcal Conjugate-13 03/05/2014  . Pneumococcal Polysaccharide-23 06/04/2009  .  Pneumococcal-Unspecified 03/13/2009  . Td 03/13/2009, 04/13/2012  . Zoster 03/21/2010   Pertinent  Health Maintenance Due  Topic Date Due  . INFLUENZA VACCINE  03/03/2021  . PNA vac Low Risk Adult  Completed  . DEXA SCAN  Discontinued   Fall Risk  01/11/2020 12/07/2012  Falls in the past year? 0 Yes  Number falls in past yr: 0 2 or more  Injury with Fall? 0 -  Risk Factor Category  - High Fall Risk  Risk for fall due to : - Impaired balance/gait   Functional Status Survey:    Vitals:   11/08/20 1212  BP: (!) 144/70  Pulse: 68  Temp: (!) 97.2 F (36.2 C)  Weight: 153 lb 3.2 oz (69.5 kg)  Height: 5\' 2"  (1.575 m)   Body mass index is 28.02 kg/m. Physical Exam Vitals reviewed.  Constitutional:      Appearance: Normal appearance.  HENT:     Head: Normocephalic.     Nose: Nose normal.     Mouth/Throat:     Mouth: Mucous membranes are moist.     Pharynx: Oropharynx is clear.  Eyes:     Pupils: Pupils are equal, round, and reactive to  light.  Cardiovascular:     Rate and Rhythm: Normal rate and regular rhythm.     Pulses: Normal pulses.  Pulmonary:     Effort: Pulmonary effort is normal.     Breath sounds: Normal breath sounds. No wheezing.  Abdominal:     General: Abdomen is flat. Bowel sounds are normal.     Palpations: Abdomen is soft.  Musculoskeletal:     Cervical back: Neck supple.     Comments: Mild Edema Bilateral  Skin:    General: Skin is warm.  Neurological:     General: No focal deficit present.     Mental Status: She is alert.     Comments: Alert and Follows Commands  Psychiatric:        Thought Content: Thought content normal.     Labs reviewed: Recent Labs    12/18/19 0000 12/18/19 0700 09/23/20 0000  NA 134*  --  138  K 4.4  --  3.9  CL 98*  --  102  CO2 22  --  23*  BUN 24*  --  35*  CREATININE 0.9  --  1.1  CALCIUM  --  9.6 8.7   No results for input(s): AST, ALT, ALKPHOS, BILITOT, PROT, ALBUMIN in the last 8760 hours. Recent Labs    12/18/19 0000 09/23/20 0000  WBC 9.1 7.1  HGB 11.0* 11.8*  HCT 33* 34*  PLT 245 232   Lab Results  Component Value Date   TSH 1.52 09/23/2020   No results found for: HGBA1C No results found for: CHOL, HDL, LDLCALC, LDLDIRECT, TRIG, CHOLHDL  Significant Diagnostic Results in last 30 days:  No results found.  Assessment/Plan Severe persistent asthma without complication Stable on number of medications including budesonide the extent Flonase and Perforomist Also on Atarax and Singulair Essential hypertension Blood pressure is running little on the lower side.  Some blood pressure documented as systolic 80 will decrease her Benicar to 10 mg.  Check blood pressure QOD for 2 weeks and reval  Late onset Alzheimer's dementia with behavioral disturbance (HCC) Stable on Namenda  Stage 3b chronic kidney disease (HCC) Creatinine stable  Leg edema On low-dose of Lasix  Gastro-esophageal reflux disease without esophagitis Continue  Prilosec  Depression Doing well on Prozac   Family/  staff Communication:   Labs/tests ordered:  BMP in 2 weeks  for Potassium

## 2020-11-22 DIAGNOSIS — E876 Hypokalemia: Secondary | ICD-10-CM | POA: Diagnosis not present

## 2020-11-22 LAB — BASIC METABOLIC PANEL
BUN: 25 — AB (ref 4–21)
CO2: 25 — AB (ref 13–22)
Chloride: 104 (ref 99–108)
Creatinine: 1 (ref 0.5–1.1)
Glucose: 86
Potassium: 3.9 (ref 3.4–5.3)
Sodium: 138 (ref 137–147)

## 2020-11-22 LAB — COMPREHENSIVE METABOLIC PANEL: Calcium: 9.2 (ref 8.7–10.7)

## 2020-12-03 ENCOUNTER — Encounter: Payer: Self-pay | Admitting: Orthopedic Surgery

## 2020-12-03 ENCOUNTER — Non-Acute Institutional Stay (SKILLED_NURSING_FACILITY): Payer: Medicare Other | Admitting: Orthopedic Surgery

## 2020-12-03 DIAGNOSIS — K219 Gastro-esophageal reflux disease without esophagitis: Secondary | ICD-10-CM

## 2020-12-03 DIAGNOSIS — J4550 Severe persistent asthma, uncomplicated: Secondary | ICD-10-CM

## 2020-12-03 DIAGNOSIS — R600 Localized edema: Secondary | ICD-10-CM | POA: Diagnosis not present

## 2020-12-03 DIAGNOSIS — N1832 Chronic kidney disease, stage 3b: Secondary | ICD-10-CM

## 2020-12-03 DIAGNOSIS — F0281 Dementia in other diseases classified elsewhere with behavioral disturbance: Secondary | ICD-10-CM | POA: Diagnosis not present

## 2020-12-03 DIAGNOSIS — G301 Alzheimer's disease with late onset: Secondary | ICD-10-CM

## 2020-12-03 DIAGNOSIS — I10 Essential (primary) hypertension: Secondary | ICD-10-CM | POA: Diagnosis not present

## 2020-12-03 NOTE — Progress Notes (Addendum)
Location:    [facility] Room Number: 121 Place of Service:  SNF [phone]) Provider:  Windell Moulding, NP  Virgie Dad, MD  Patient Care Team: Virgie Dad, MD as PCP - General (Internal Medicine) Elsie Stain, MD (Pulmonary Disease)  Extended Emergency Contact Information Primary Emergency Contact: Earlene Plater of [place] Phone: [phone] Mobile Phone: [phone] Relation: Daughter  Code Status:  DNR Goals of care: Advanced Directive information Advanced Directives 12/03/2020  Does Patient Have a Medical Advance Directive? Yes  Type of Paramedic of [place];Living will;Out of facility DNR (pink MOST or yellow form)  Does patient want to make changes to medical advance directive? No - Patient declined  Copy of [place] in Chart? Yes - validated most recent copy scanned in chart (See row information)  Pre-existing out of facility DNR order (yellow form or pink MOST form) Yellow form placed in chart (order not valid for inpatient use)     Chief Complaint  Patient presents with  . Medical Management of Chronic Issues    HPI:  Pt is a 87 y.o. female seen today for medical management of chronic diseases.    Today, she is sitting in her recliner with her calendar. Alert to self and familiar faces. Disoriented to time, place and situation. Follows commands and can express needs. During our encounter she asked to go home three times. Nursing staff reports behavioral outbursts after son visits. 04/29 she was found to be shouting, saying she needed to be with her husband because of a scandal. Vistaril given to calm her down, being given about once a week for outbursts.   No recent falls or injuries.   Benicar discontinued 04/25.   Recent blood pressures are as follows:  05/03- 133/69  04/26- 138/80  04/22- 114/50  04/19- 130/55  Recent weights are as follows:  04/28-  152.4 lbs  04/04- 153.2 lbs  03/01- 155 lbs  02/01- 153 lbs  Nursing does not report any concerns, vitals stable.       Past Medical History:  Diagnosis Date  . Age-related osteoporosis without current pathological fracture   . Alzheimer's disease ([place])    with late onset  . Arthritis   . Asthma   . Deficiency of other specified B group vitamins   . Depression   . Environmental allergies    mold  . Fall   . GERD without esophagitis   . Hyperlipidemia   . Hypertension   . Hypertensive kidney disease with CKD (chronic kidney disease)    with stage I through stage 4 CKD   . Memory loss   . Seasonal allergies   . Unspecified osteoarthritis, unspecified site    Past Surgical History:  Procedure Laterality Date  . HIP ARTHROPLASTY  06/25/2011   Procedure: ARTHROPLASTY BIPOLAR HIP;  Surgeon: Laurice Record Aplington;  Location: [facility];  Service: Orthopedics;  Laterality: Right;  . NASAL SINUS SURGERY    . VAGINAL HYSTERECTOMY      Allergies  Allergen Reactions  . Azithromycin Other (See Comments)    Nausea, weakness   . Aspirin Other (See Comments)    Unknown reaction  . Molds & Smuts   . Septra [Sulfamethoxazole-Trimethoprim]   . Sulfa Antibiotics   . Sulfonamide Derivatives Other (See Comments)    Unknown reaction    Allergies as of 12/03/2020      Reactions   Azithromycin Other (See Comments)   Nausea,  weakness    Aspirin Other (See Comments)   Unknown reaction   Molds & Smuts    Septra [sulfamethoxazole-trimethoprim]    Sulfa Antibiotics    Sulfonamide Derivatives Other (See Comments)   Unknown reaction      Medication List       Accurate as of Dec 03, 2020  3:03 PM. If you have any questions, ask your nurse or doctor.        STOP taking these medications   EPINEPHrine 0.3 mg/0.3 mL Soaj injection Commonly known as: EPI-PEN Stopped by: Yvonna Alanis, NP   olmesartan 20 MG tablet Commonly known as: BENICAR Stopped by: Yvonna Alanis, NP     TAKE these  medications   acetaminophen 500 MG tablet Commonly known as: TYLENOL Take 500 mg by mouth in the morning, at noon, and at bedtime.   budesonide 0.5 MG/2ML nebulizer solution Commonly known as: PULMICORT Take 0.5 mg by nebulization 2 (two) times daily.   CALCIUM 600-D PO Take 1 tablet by mouth daily.   dextromethorphan 30 MG/5ML liquid Commonly known as: DELSYM Take 60 mg by mouth every 12 (twelve) hours as needed for cough.   docusate sodium 100 MG capsule Commonly known as: COLACE Take 100 mg by mouth at bedtime.   Dupixent 300 MG/2ML prefilled syringe Generic drug: dupilumab Inject 1 pen/syringe subcutaneously every other week STARTING ON DAY 15   FLUoxetine 20 MG capsule Commonly known as: PROZAC Take 40 mg by mouth daily.   fluticasone 50 MCG/ACT nasal spray Commonly known as: FLONASE Place 2 sprays into both nostrils daily. 8 am and 11 am   furosemide 20 MG tablet Commonly known as: LASIX Take 20 mg by mouth daily.   hydrOXYzine 25 MG tablet Commonly known as: ATARAX/VISTARIL Take 25 mg by mouth 2 (two) times daily as needed.   ipratropium-albuterol 0.5-2.5 (3) MG/3ML Soln Commonly known as: DUONEB Take 3 mLs by nebulization every 6 (six) hours as needed.   lactose free nutrition Liqd Take 237 mLs by mouth daily.   memantine 10 MG tablet Commonly known as: Namenda Take 1 tablet (10 mg total) by mouth 2 (two) times daily.   montelukast 10 MG tablet Commonly known as: SINGULAIR Take 1 tablet (10 mg total) by mouth daily.   omeprazole 20 MG capsule Commonly known as: PRILOSEC Take 20 mg by mouth 2 (two) times daily.   ondansetron 4 MG tablet Commonly known as: ZOFRAN Take 4 mg by mouth every 8 (eight) hours as needed for nausea or vomiting.   Perforomist 20 MCG/2ML nebulizer solution Generic drug: formoterol Take 20 mcg by nebulization 2 (two) times daily.   polyethylene glycol 17 g packet Commonly known as: MIRALAX / GLYCOLAX Take 17 g by mouth  daily as needed.   Salonpas 3.08-08-08 % Ptch Generic drug: Camphor-Menthol-Methyl Sal Apply 1 patch topically daily. On in the am and off in the pm   Systane 0.4-0.3 % Gel ophthalmic gel Generic drug: Polyethyl Glycol-Propyl Glycol Place 1 application into both eyes at bedtime.   triamcinolone cream 0.1 % Commonly known as: KENALOG Apply 1 application topically as needed.   vitamin B-12 1000 MCG tablet Commonly known as: CYANOCOBALAMIN Take 3,000 mcg by mouth daily.   Vitamin D 50 MCG (2000 UT) tablet Take 2,000 Units by mouth at bedtime.       Review of Systems  Unable to perform ROS: Dementia    Immunization History  Administered Date(s) Administered  . Influenza Inj Mdck Quad  Pf 05/18/2019  . Influenza Split 04/04/2012, 05/03/2013, 05/10/2015  . Influenza Whole 04/18/2008, 06/04/2009, 04/03/2010, 03/30/2011  . Influenza, High Dose Seasonal PF 04/20/2018, 05/24/2020  . Influenza,inj,Quad PF,6+ Mos 03/26/2017  . Influenza-Unspecified 03/26/2010, 04/13/2012, 05/08/2013, 05/14/2014, 06/03/2014, 05/13/2015, 04/27/2016, 04/29/2016  . Moderna SARS-COV2 Booster Vaccination 06/13/2020  . Moderna Sars-Covid-2 Vaccination 09/05/2019, 10/03/2019  . Pneumococcal Conjugate-13 03/05/2014  . Pneumococcal Polysaccharide-23 06/04/2009  . Pneumococcal-Unspecified 03/13/2009  . Td 03/13/2009, 04/13/2012  . Zoster 03/21/2010   Pertinent  Health Maintenance Due  Topic Date Due  . INFLUENZA VACCINE  03/03/2021  . PNA vac Low Risk Adult  Completed  . DEXA SCAN  Discontinued   Fall Risk  01/11/2020 12/07/2012  Falls in the past year? 0 Yes  Number falls in past yr: 0 2 or more  Injury with Fall? 0 -  Risk Factor Category  - High Fall Risk  Risk for fall due to : - Impaired balance/gait   Functional Status Survey:    Vitals:   12/03/20 1440  BP: 133/69  Pulse: (!) 57  Resp: 17  Temp: 98.5 F (36.9 C)  SpO2: 96%  Weight: 152 lb 6.4 oz (69.1 kg)  Height: 5\' 2"  (1.575 m)    Body mass index is 27.87 kg/m. Physical Exam Vitals reviewed.  Constitutional:      General: She is not in acute distress. HENT:     Head: Normocephalic.     Comments: Bilateral hearing aids    Right Ear: There is no impacted cerumen.     Left Ear: There is no impacted cerumen.     Nose: Nose normal.     Mouth/Throat:     Mouth: Mucous membranes are moist.  Eyes:     General:        Right eye: No discharge.        Left eye: No discharge.     Comments: glasses  Neck:     Thyroid: No thyroid mass or thyromegaly.  Cardiovascular:     Rate and Rhythm: Normal rate and regular rhythm.     Pulses: Normal pulses.     Heart sounds: Normal heart sounds. No murmur heard.   Pulmonary:     Effort: Pulmonary effort is normal. No respiratory distress.     Breath sounds: Normal breath sounds. No wheezing.  Abdominal:     General: Bowel sounds are normal. There is no distension.     Palpations: Abdomen is soft.     Tenderness: There is no abdominal tenderness.  Musculoskeletal:     Cervical back: Normal range of motion.     Right lower leg: Edema present.     Left lower leg: Edema present.     Comments: Non-pitting  Lymphadenopathy:     Cervical: No cervical adenopathy.  Skin:    General: Skin is warm and dry.     Capillary Refill: Capillary refill takes less than 2 seconds.  Neurological:     General: No focal deficit present.     Mental Status: She is alert. Mental status is at baseline.     Motor: Weakness present.     Gait: Gait abnormal.  Psychiatric:        Mood and Affect: Mood normal.        Behavior: Behavior normal.        Cognition and Memory: Memory is impaired.     Labs reviewed: Recent Labs    12/18/19 0000 12/18/19 0700 09/23/20 0000 11/22/20 0000  NA 134*  --  138 138  K 4.4  --  3.9 3.9  CL 98*  --  102 104  CO2 22  --  23* 25*  BUN 24*  --  35* 25*  CREATININE 0.9  --  1.1 1.0  CALCIUM  --  9.6 8.7 9.2   No results for input(s): AST, ALT,  ALKPHOS, BILITOT, PROT, ALBUMIN in the last 8760 hours. Recent Labs    12/18/19 0000 09/23/20 0000  WBC 9.1 7.1  HGB 11.0* 11.8*  HCT 33* 34*  PLT 245 232   Lab Results  Component Value Date   TSH 1.52 09/23/2020   No results found for: HGBA1C No results found for: CHOL, HDL, LDLCALC, LDLDIRECT, TRIG, CHOLHDL  Significant Diagnostic Results in last 30 days:  No results found.  Assessment/Plan 1. Severe persistent asthma without complication - lung sounds clear, no labored breathing - cont dupixent injections every other week - cont pulmicort nebs bid - cont duonebs prn - cont singulair  2. Essential hypertension - bp at goal < 150/90 - not on meds, cont low sodium diet  3. Late onset Alzheimer's dementia with behavioral disturbance ([place]) - behavioral outbursts averaging 1-2 times a week - cont vistaril for agitation - cont namenda  4. Stage 3b chronic kidney disease ([place]) - 11/22/2020- BUN 25, Creatinine 1.0 - cont to avoid nephrotoxic drugs like NSAIDS and dose adjust medications to be renally excreted - encourage hydration  5. Leg edema - non-pitting - cont lasix 20 mg po daily - encourage leg elevation a few hours daily  6. Gastro-esophageal reflux disease without esophagitis - stable with prilosec    Family/ staff Communication:   Labs/tests ordered:

## 2021-01-02 ENCOUNTER — Encounter: Payer: Self-pay | Admitting: Adult Health

## 2021-01-02 ENCOUNTER — Non-Acute Institutional Stay (SKILLED_NURSING_FACILITY): Payer: Medicare Other | Admitting: Adult Health

## 2021-01-02 DIAGNOSIS — M542 Cervicalgia: Secondary | ICD-10-CM | POA: Diagnosis not present

## 2021-01-02 DIAGNOSIS — G301 Alzheimer's disease with late onset: Secondary | ICD-10-CM | POA: Diagnosis not present

## 2021-01-02 DIAGNOSIS — F0281 Dementia in other diseases classified elsewhere with behavioral disturbance: Secondary | ICD-10-CM | POA: Diagnosis not present

## 2021-01-02 DIAGNOSIS — K219 Gastro-esophageal reflux disease without esophagitis: Secondary | ICD-10-CM

## 2021-01-02 DIAGNOSIS — F02818 Dementia in other diseases classified elsewhere, unspecified severity, with other behavioral disturbance: Secondary | ICD-10-CM

## 2021-01-02 DIAGNOSIS — J4550 Severe persistent asthma, uncomplicated: Secondary | ICD-10-CM | POA: Diagnosis not present

## 2021-01-02 DIAGNOSIS — N1832 Chronic kidney disease, stage 3b: Secondary | ICD-10-CM

## 2021-01-02 DIAGNOSIS — I10 Essential (primary) hypertension: Secondary | ICD-10-CM

## 2021-01-02 NOTE — Progress Notes (Signed)
Location:  [facility] Room Number: 121-A Place of Service:  SNF [phone]) Provider:  Royal Hawthorn, NP   Patient Care Team: Virgie Dad, MD as PCP - General (Internal Medicine) Elsie Stain, MD (Pulmonary Disease)  Extended Emergency Contact Information Primary Emergency Contact: Earlene Plater of [place] Phone: [phone] Mobile Phone: [phone] Relation: Daughter  Code Status:  DNR  Goals of care: Advanced Directive information Advanced Directives 01/02/2021  Does Patient Have a Medical Advance Directive? Yes  Type of Paramedic of [place];Living will;Out of facility DNR (pink MOST or yellow form)  Does patient want to make changes to medical advance directive? No - Patient declined  Copy of [place] in Chart? Yes - validated most recent copy scanned in chart (See row information)  Pre-existing out of facility DNR order (yellow form or pink MOST form) Yellow form placed in chart (order not valid for inpatient use)     Chief Complaint  Patient presents with  . Medical Management of Chronic Issues    Routine Visit and discuss need for zoster vaccines or exclude     HPI:  Pt is a 87 y.o. female seen today for medical management of chronic diseases.    PMH significant for asthma, OP, arthritis, Vit d deficiency, depression, dementia, HTN, HLD, hyperlipidemia, seasonal allergies, OA, and GERD.   She is non ambulatory and resides in skilled care due to physical function and memory loss. She has periods of delusional thoughts and agitation around 4 p each day.  Does not appear depressed but at times slightly anxious in the afternoon. She had a prn order for atarax but it expired since it was not being used regularly. She is no longer on bp meds and her bp has remained stable. Her weight is stable with no issues chewing or swallowing. She has some neck pain and uses a  salonas patch and tylenol which helps.  Wt Readings from Last 3 Encounters:  01/02/21 154 lb 12.8 oz (70.2 kg)  12/03/20 152 lb 6.4 oz (69.1 kg)  11/08/20 153 lb 3.2 oz (69.5 kg)      Past Medical History:  Diagnosis Date  . Age-related osteoporosis without current pathological fracture   . Alzheimer's disease ([place])    with late onset  . Arthritis   . Asthma   . Deficiency of other specified B group vitamins   . Depression   . Environmental allergies    mold  . Fall   . GERD without esophagitis   . Hyperlipidemia   . Hypertension   . Hypertensive kidney disease with CKD (chronic kidney disease)    with stage I through stage 4 CKD   . Memory loss   . Seasonal allergies   . Unspecified osteoarthritis, unspecified site    Past Surgical History:  Procedure Laterality Date  . HIP ARTHROPLASTY  06/25/2011   Procedure: ARTHROPLASTY BIPOLAR HIP;  Surgeon: Laurice Record [place]ton;  Location: [facility];  Service: Orthopedics;  Laterality: Right;  . NASAL SINUS SURGERY    . VAGINAL HYSTERECTOMY      Allergies  Allergen Reactions  . Azithromycin Other (See Comments)    Nausea, weakness   . Aspirin Other (See Comments)    Unknown reaction  . Molds & Smuts   . Septra [Sulfamethoxazole-Trimethoprim]   . Sulfa Antibiotics   . Sulfonamide Derivatives Other (See Comments)    Unknown reaction    Outpatient Encounter Medications as of  01/02/2021  Medication Sig  . acetaminophen (TYLENOL) 500 MG tablet Take 500 mg by mouth in the morning, at noon, and at bedtime.   . budesonide (PULMICORT) 0.5 MG/2ML nebulizer solution Take 0.5 mg by nebulization 2 (two) times daily.  . Calcium Carb-Cholecalciferol (CALCIUM 600-D PO) Take 1 tablet by mouth daily.  . Camphor-Menthol-Methyl Sal (SALONPAS) 3.08-08-08 % PTCH Apply 1 patch topically daily. On in the am and off in the pm  . Cholecalciferol (VITAMIN D) 2000 units tablet Take 2,000 Units by mouth at bedtime.  Marland Kitchen dextromethorphan (DELSYM) 30 MG/5ML  liquid Take 60 mg by mouth every 12 (twelve) hours as needed for cough.  . docusate sodium (COLACE) 100 MG capsule Take 100 mg by mouth at bedtime.   . DUPIXENT 300 MG/2ML SOSY Inject 1 pen/syringe subcutaneously every other week STARTING ON DAY 15  . FLUoxetine (PROZAC) 20 MG capsule Take 40 mg by mouth daily.  . fluticasone (FLONASE) 50 MCG/ACT nasal spray Place 2 sprays into both nostrils daily. 8 am and 11 am  . formoterol (PERFOROMIST) 20 MCG/2ML nebulizer solution Take 20 mcg by nebulization 2 (two) times daily.  . furosemide (LASIX) 20 MG tablet Take 20 mg by mouth daily.  Marland Kitchen ipratropium-albuterol (DUONEB) 0.5-2.5 (3) MG/3ML SOLN Take 3 mLs by nebulization every 6 (six) hours as needed.  . lactose free nutrition (BOOST) LIQD Take 237 mLs by mouth daily.  . memantine (NAMENDA) 10 MG tablet Take 1 tablet (10 mg total) by mouth 2 (two) times daily.  . montelukast (SINGULAIR) 10 MG tablet Take 1 tablet (10 mg total) by mouth daily.  Marland Kitchen omeprazole (PRILOSEC) 20 MG capsule Take 20 mg by mouth 2 (two) times daily.  . ondansetron (ZOFRAN) 4 MG tablet Take 4 mg by mouth every 8 (eight) hours as needed for nausea or vomiting.  Vladimir Faster Glycol-Propyl Glycol (SYSTANE) 0.4-0.3 % GEL ophthalmic gel Place 1 application into both eyes at bedtime.  . polyethylene glycol (MIRALAX / GLYCOLAX) packet Take 17 g by mouth daily as needed.  . triamcinolone cream (KENALOG) 0.1 % Apply 1 application topically as needed.  . vitamin B-12 (CYANOCOBALAMIN) 1000 MCG tablet Take 3,000 mcg by mouth daily.  . [DISCONTINUED] rivaroxaban (XARELTO) 10 MG TABS tablet Take 1 tablet (10 mg total) by mouth daily.   No facility-administered encounter medications on file as of 01/02/2021.    Review of Systems  Constitutional: Negative for activity change, appetite change, chills, diaphoresis, fatigue, fever and unexpected weight change.  HENT: Negative for congestion.   Respiratory: Negative for cough, shortness of breath and  wheezing.   Cardiovascular: Negative for chest pain, palpitations and leg swelling.  Gastrointestinal: Negative for abdominal distention, abdominal pain, constipation and diarrhea.  Genitourinary: Negative for difficulty urinating and dysuria.  Musculoskeletal: Positive for gait problem and neck pain (improves with meds). Negative for arthralgias, back pain, joint swelling and myalgias.  Neurological: Negative for dizziness, tremors, seizures, syncope, facial asymmetry, speech difficulty, weakness, light-headedness, numbness and headaches.  Psychiatric/Behavioral: Positive for agitation and confusion. Negative for behavioral problems, decreased concentration, dysphoric mood, hallucinations, self-injury, sleep disturbance and suicidal ideas. The patient is nervous/anxious. The patient is not hyperactive.     Immunization History  Administered Date(s) Administered  . Influenza Inj Mdck Quad Pf 05/18/2019  . Influenza Split 04/04/2012, 05/03/2013, 05/10/2015  . Influenza Whole 04/18/2008, 06/04/2009, 04/03/2010, 03/30/2011  . Influenza, High Dose Seasonal PF 04/20/2018, 05/24/2020  . Influenza,inj,Quad PF,6+ Mos 03/26/2017  . Influenza-Unspecified 03/26/2010, 04/13/2012, 05/08/2013, 05/14/2014, 06/03/2014, 05/13/2015,  04/27/2016, 04/29/2016  . Moderna SARS-COV2 Booster Vaccination 06/13/2020  . Moderna Sars-Covid-2 Vaccination 09/05/2019, 10/03/2019  . Pneumococcal Conjugate-13 03/05/2014  . Pneumococcal Polysaccharide-23 06/04/2009  . Pneumococcal-Unspecified 03/13/2009  . Td 03/13/2009, 04/13/2012  . Zoster, Live 03/21/2010   Pertinent  Health Maintenance Due  Topic Date Due  . INFLUENZA VACCINE  03/03/2021  . PNA vac Low Risk Adult  Completed  . DEXA SCAN  Discontinued   Fall Risk  01/11/2020 12/07/2012  Falls in the past year? 0 Yes  Number falls in past yr: 0 2 or more  Injury with Fall? 0 -  Risk Factor Category  - High Fall Risk  Risk for fall due to : - Impaired balance/gait    Functional Status Survey:    Vitals:   01/02/21 1529  BP: 132/80  Pulse: 77  Resp: 17  Temp: 97.9 F (36.6 C)  SpO2: 97%  Weight: 154 lb 12.8 oz (70.2 kg)  Height: 5\' 2"  (1.575 m)   Body mass index is 28.31 kg/m. Physical Exam Vitals and nursing note reviewed.  Constitutional:      General: She is not in acute distress.    Appearance: She is not diaphoretic.  HENT:     Head: Normocephalic and atraumatic.     Mouth/Throat:     Mouth: Mucous membranes are moist.     Pharynx: Oropharynx is clear.  Eyes:     Conjunctiva/sclera: Conjunctivae normal.     Pupils: Pupils are equal, round, and reactive to light.  Neck:     Vascular: No JVD.  Cardiovascular:     Rate and Rhythm: Normal rate and regular rhythm.     Heart sounds: No murmur heard.   Pulmonary:     Effort: Pulmonary effort is normal. No respiratory distress.     Breath sounds: Normal breath sounds. No wheezing.  Abdominal:     General: Bowel sounds are normal. There is no distension.     Palpations: Abdomen is soft.     Tenderness: There is no abdominal tenderness.  Musculoskeletal:     Cervical back: No rigidity or tenderness.     Right lower leg: No edema.     Left lower leg: No edema.  Lymphadenopathy:     Cervical: No cervical adenopathy.  Skin:    General: Skin is warm and dry.  Neurological:     General: No focal deficit present.     Mental Status: She is alert. Mental status is at baseline.     Labs reviewed: Recent Labs    09/23/20 0000 11/22/20 0000  NA 138 138  K 3.9 3.9  CL 102 104  CO2 23* 25*  BUN 35* 25*  CREATININE 1.1 1.0  CALCIUM 8.7 9.2   No results for input(s): AST, ALT, ALKPHOS, BILITOT, PROT, ALBUMIN in the last 8760 hours. Recent Labs    09/23/20 0000  WBC 7.1  HGB 11.8*  HCT 34*  PLT 232   Lab Results  Component Value Date   TSH 1.52 09/23/2020   No results found for: HGBA1C No results found for: CHOL, HDL, LDLCALC, LDLDIRECT, TRIG,  CHOLHDL  Significant Diagnostic Results in last 30 days:  No results found.  Assessment/Plan  1. Essential hypertension Controlled with losartan Continue lasix   2. Severe persistent asthma without complication No wheezing or cough at this time Continue pulmicort, dupixient, and perforomorist  3. Gastro-esophageal reflux disease without esophagitis Continue prilosec 20 mg bid which may help with cough related symptoms as well  4. Late onset Alzheimer's dementia with behavioral disturbance ([facility]) Needs MMSE Continue Namenda 10 mg bid  Can add back vistaril if necessary  5. Stage 3b chronic kidney disease ([place]) Lab Results  Component Value Date   BUN 25 (A) 11/22/2020   Lab Results  Component Value Date   CREATININE 1.0 11/22/2020   Continue to periodically monitor BMP and avoid nephrotoxic agents   6. Neck pain Likely arthritis related. Would continue tylenol and salonpas for now.    Labs/tests ordered:  NA

## 2021-01-03 ENCOUNTER — Encounter: Payer: Self-pay | Admitting: Adult Health

## 2021-02-24 ENCOUNTER — Encounter: Payer: Self-pay | Admitting: Internal Medicine

## 2021-02-24 ENCOUNTER — Non-Acute Institutional Stay (SKILLED_NURSING_FACILITY): Payer: Medicare Other | Admitting: Internal Medicine

## 2021-02-24 DIAGNOSIS — G301 Alzheimer's disease with late onset: Secondary | ICD-10-CM | POA: Diagnosis not present

## 2021-02-24 DIAGNOSIS — K219 Gastro-esophageal reflux disease without esophagitis: Secondary | ICD-10-CM

## 2021-02-24 DIAGNOSIS — J4550 Severe persistent asthma, uncomplicated: Secondary | ICD-10-CM | POA: Diagnosis not present

## 2021-02-24 DIAGNOSIS — F0281 Dementia in other diseases classified elsewhere with behavioral disturbance: Secondary | ICD-10-CM

## 2021-02-24 DIAGNOSIS — I10 Essential (primary) hypertension: Secondary | ICD-10-CM | POA: Diagnosis not present

## 2021-02-24 DIAGNOSIS — F325 Major depressive disorder, single episode, in full remission: Secondary | ICD-10-CM | POA: Diagnosis not present

## 2021-02-24 DIAGNOSIS — R600 Localized edema: Secondary | ICD-10-CM | POA: Diagnosis not present

## 2021-02-24 DIAGNOSIS — N1832 Chronic kidney disease, stage 3b: Secondary | ICD-10-CM | POA: Diagnosis not present

## 2021-02-24 NOTE — Progress Notes (Signed)
Location:   [facility] Room Number: 121 Place of Service:  SNF [phone]) Provider:  Veleta Miners MD  Virgie Dad, MD  Patient Care Team: Virgie Dad, MD as PCP - General (Internal Medicine) Elsie Stain, MD (Pulmonary Disease)  Extended Emergency Contact Information Primary Emergency Contact: Earlene Plater of [place] Phone: [phone] Mobile Phone: [phone] Relation: Daughter  Code Status:  DNR Goals of care: Advanced Directive information Advanced Directives 02/24/2021  Does Patient Have a Medical Advance Directive? Yes  Type of Paramedic of [place];Living will;Out of facility DNR (pink MOST or yellow form)  Does patient want to make changes to medical advance directive? No - Patient declined  Copy of [place] in Chart? Yes - validated most recent copy scanned in chart (See row information)  Pre-existing out of facility DNR order (yellow form or pink MOST form) Yellow form placed in chart (order not valid for inpatient use)     Chief Complaint  Patient presents with   Medical Management of Chronic Issues   Health Maintenance    #4 covid vaccination    HPI:  Pt is a 87 y.o. female seen today for medical management of chronic diseases.    Patient has a history of Alzheimer's dementia dependent for her ADLs Osteoporosis and knee osteoarthritis CKD stage 3b Severe asthma HLD and hypertension  No New Issues per Nurses. No Falls. Weight is stable.  Pleasantly confused  Past Medical History:  Diagnosis Date   Age-related osteoporosis without current pathological fracture    Alzheimer's disease ([place])    with late onset   Arthritis    Asthma    Deficiency of other specified B group vitamins    Depression    Environmental allergies    mold   Fall    GERD without esophagitis    Hyperlipidemia    Hypertension    Hypertensive kidney disease with  CKD (chronic kidney disease)    with stage I through stage 4 CKD    Memory loss    Seasonal allergies    Unspecified osteoarthritis, unspecified site    Past Surgical History:  Procedure Laterality Date   HIP ARTHROPLASTY  06/25/2011   Procedure: ARTHROPLASTY BIPOLAR HIP;  Surgeon: Laurice Record [place];  Location: [facility];  Service: Orthopedics;  Laterality: Right;   NASAL SINUS SURGERY     VAGINAL HYSTERECTOMY      Allergies  Allergen Reactions   Azithromycin Other (See Comments)    Nausea, weakness    Aspirin Other (See Comments)    Unknown reaction   Molds & Smuts    Septra [Sulfamethoxazole-Trimethoprim]    Sulfa Antibiotics    Sulfonamide Derivatives Other (See Comments)    Unknown reaction    Allergies as of 02/24/2021       Reactions   Azithromycin Other (See Comments)   Nausea, weakness    Aspirin Other (See Comments)   Unknown reaction   Molds & Smuts    Septra [sulfamethoxazole-trimethoprim]    Sulfa Antibiotics    Sulfonamide Derivatives Other (See Comments)   Unknown reaction        Medication List        Accurate as of February 24, 2021 10:17 AM. If you have any questions, ask your nurse or doctor.          acetaminophen 500 MG tablet Commonly known as: TYLENOL Take 500 mg by mouth in the morning, at noon,  and at bedtime.   budesonide 0.5 MG/2ML nebulizer solution Commonly known as: PULMICORT Take 0.5 mg by nebulization 2 (two) times daily.   CALCIUM 600-D PO Take 1 tablet by mouth daily.   dextromethorphan 30 MG/5ML liquid Commonly known as: DELSYM Take 60 mg by mouth every 12 (twelve) hours as needed for cough.   docusate sodium 100 MG capsule Commonly known as: COLACE Take 100 mg by mouth at bedtime.   Dupixent 300 MG/2ML prefilled syringe Generic drug: dupilumab Inject 1 pen/syringe subcutaneously every other week STARTING ON DAY 15   FLUoxetine 20 MG capsule Commonly known as: PROZAC Take 40 mg by mouth daily.   fluticasone 50  MCG/ACT nasal spray Commonly known as: FLONASE Place 2 sprays into both nostrils daily. 8 am and 11 am   furosemide 20 MG tablet Commonly known as: LASIX Take 20 mg by mouth daily.   ipratropium-albuterol 0.5-2.5 (3) MG/3ML Soln Commonly known as: DUONEB Take 3 mLs by nebulization every 6 (six) hours as needed.   lactose free nutrition Liqd Take 237 mLs by mouth daily.   memantine 10 MG tablet Commonly known as: Namenda Take 1 tablet (10 mg total) by mouth 2 (two) times daily.   montelukast 10 MG tablet Commonly known as: SINGULAIR Take 1 tablet (10 mg total) by mouth daily.   omeprazole 20 MG capsule Commonly known as: PRILOSEC Take 20 mg by mouth 2 (two) times daily.   ondansetron 4 MG tablet Commonly known as: ZOFRAN Take 4 mg by mouth every 8 (eight) hours as needed for nausea or vomiting.   Perforomist 20 MCG/2ML nebulizer solution Generic drug: formoterol Take 20 mcg by nebulization 2 (two) times daily.   polyethylene glycol 17 g packet Commonly known as: MIRALAX / GLYCOLAX Take 17 g by mouth daily as needed.   Salonpas 3.08-08-08 % Ptch Generic drug: Camphor-Menthol-Methyl Sal Apply 1 patch topically daily. On in the am and off in the pm   Systane 0.4-0.3 % Gel ophthalmic gel Generic drug: Polyethyl Glycol-Propyl Glycol Place 1 application into both eyes at bedtime.   triamcinolone cream 0.1 % Commonly known as: KENALOG Apply 1 application topically as needed.   vitamin B-12 1000 MCG tablet Commonly known as: CYANOCOBALAMIN Take 3,000 mcg by mouth daily.   Vitamin D 50 MCG (2000 UT) tablet Take 2,000 Units by mouth at bedtime.        Review of Systems  Unable to perform ROS: Dementia   Immunization History  Administered Date(s) Administered   Influenza Inj Mdck Quad Pf 05/18/2019   Influenza Split 04/04/2012, 05/03/2013, 05/10/2015   Influenza Whole 04/18/2008, 06/04/2009, 04/03/2010, 03/30/2011   Influenza, High Dose Seasonal PF 04/20/2018,  05/24/2020   Influenza,inj,Quad PF,6+ Mos 03/26/2017   Influenza-Unspecified 03/26/2010, 04/13/2012, 05/08/2013, 05/14/2014, 06/03/2014, 05/13/2015, 04/27/2016, 04/29/2016   Moderna SARS-COV2 Booster Vaccination 06/13/2020   Moderna Sars-Covid-2 Vaccination 09/05/2019, 10/03/2019   Pneumococcal Conjugate-13 03/05/2014   Pneumococcal Polysaccharide-23 06/04/2009   Pneumococcal-Unspecified 03/13/2009   Td 03/13/2009, 04/13/2012   Zoster Recombinat (Shingrix) 08/03/2017, 10/01/2017   Zoster, Live 03/21/2010   Pertinent  Health Maintenance Due  Topic Date Due   INFLUENZA VACCINE  03/03/2021   PNA vac Low Risk Adult  Completed   DEXA SCAN  Discontinued   Fall Risk  01/11/2020 12/07/2012  Falls in the past year? 0 Yes  Number falls in past yr: 0 2 or more  Injury with Fall? 0 -  Risk Factor Category  - High Fall Risk  Risk for fall  due to : - Impaired balance/gait   Functional Status Survey:    Vitals:   02/24/21 0953  BP: (!) 150/61  Pulse: 76  Resp: 16  Temp: 98.4 F (36.9 C)  SpO2: 96%  Weight: 153 lb 12.8 oz (69.8 kg)  Height: 5\' 2"  (1.575 m)   Body mass index is 28.13 kg/m. Physical Exam Vitals reviewed.  Constitutional:      Appearance: Normal appearance.  HENT:     Head: Normocephalic.     Nose: Nose normal.     Mouth/Throat:     Mouth: Mucous membranes are moist.     Pharynx: Oropharynx is clear.  Eyes:     Pupils: Pupils are equal, round, and reactive to light.  Cardiovascular:     Rate and Rhythm: Normal rate and regular rhythm.     Pulses: Normal pulses.     Heart sounds: Normal heart sounds.  Pulmonary:     Effort: Pulmonary effort is normal.     Breath sounds: Normal breath sounds.  Abdominal:     General: Abdomen is flat. Bowel sounds are normal.     Palpations: Abdomen is soft.  Musculoskeletal:        General: No swelling.     Cervical back: Neck supple.  Skin:    General: Skin is warm.  Neurological:     General: No focal deficit present.      Mental Status: She is alert.  Psychiatric:        Mood and Affect: Mood normal.        Thought Content: Thought content normal.    Labs reviewed: Recent Labs    09/23/20 0000 11/22/20 0000  NA 138 138  K 3.9 3.9  CL 102 104  CO2 23* 25*  BUN 35* 25*  CREATININE 1.1 1.0  CALCIUM 8.7 9.2   No results for input(s): AST, ALT, ALKPHOS, BILITOT, PROT, ALBUMIN in the last 8760 hours. Recent Labs    09/23/20 0000  WBC 7.1  HGB 11.8*  HCT 34*  PLT 232   Lab Results  Component Value Date   TSH 1.52 09/23/2020   No results found for: HGBA1C No results found for: CHOL, HDL, LDLCALC, LDLDIRECT, TRIG, CHOLHDL  Significant Diagnostic Results in last 30 days:  No results found.  Assessment/Plan Essential hypertension Is off her Benicar now BP fluctuates Discussed with Nurse to inform if SBP more then 160 to let us know Severe persistent asthma without complication On Performist,Flonase, Dupixent, Singulair and Budesonide Gastro-esophageal reflux disease without esophagitis On Prilosec Late onset Alzheimer's dementia with behavioral disturbance (HCC) On Namenda Stage 3b chronic kidney disease (HCC) Creat stable Leg edema On Low dose of Lasix Major depressive disorder, single episode, in remission ([place]) Prozac   Family/ staff Communication:   Labs/tests ordered:

## 2021-03-07 ENCOUNTER — Encounter: Payer: Self-pay | Admitting: Adult Health

## 2021-03-07 ENCOUNTER — Non-Acute Institutional Stay (SKILLED_NURSING_FACILITY): Payer: Medicare Other | Admitting: Adult Health

## 2021-03-07 DIAGNOSIS — B359 Dermatophytosis, unspecified: Secondary | ICD-10-CM | POA: Diagnosis not present

## 2021-03-07 MED ORDER — KETOCONAZOLE 2 % EX CREA: TOPICAL_CREAM | Freq: Two times a day (BID) | CUTANEOUS | Status: AC

## 2021-03-07 NOTE — Progress Notes (Signed)
Location:   [facility] Room Number: 102 Place of Service:  SNF [phone]) Provider:  Royal Hawthorn, NP  Virgie Dad, MD  Patient Care Team: Virgie Dad, MD as PCP - General (Internal Medicine) Elsie Stain, MD (Pulmonary Disease)  Extended Emergency Contact Information Primary Emergency Contact: Earlene Plater of [place] Phone: [phone] Mobile Phone: [phone] Relation: Daughter  Code Status:  DNR Goals of care: Advanced Directive information Advanced Directives 03/07/2021  Does Patient Have a Medical Advance Directive? Yes  Type of Paramedic of [place];Living will;Out of facility DNR (pink MOST or yellow form)  Does patient want to make changes to medical advance directive? No - Patient declined  Copy of [place] in Chart? Yes - validated most recent copy scanned in chart (See row information)  Pre-existing out of facility DNR order (yellow form or pink MOST form) Yellow form placed in chart (order not valid for inpatient use)     Chief Complaint  Patient presents with   Acute Visit    Rash    HPI:  Pt is a 87 y.o. female seen today for an acute visit for a rash to the right under arm. Circular rash, present for several days not improving with nystatin cream or triamcinolone. No itching, no wheezing, no fever.    Past Medical History:  Diagnosis Date   Age-related osteoporosis without current pathological fracture    Alzheimer's disease ([place])    with late onset   Arthritis    Asthma    Deficiency of other specified B group vitamins    Depression    Environmental allergies    mold   Fall    GERD without esophagitis    Hyperlipidemia    Hypertension    Hypertensive kidney disease with CKD (chronic kidney disease)    with stage I through stage 4 CKD    Memory loss    Seasonal allergies    Unspecified osteoarthritis, unspecified site    Past  Surgical History:  Procedure Laterality Date   HIP ARTHROPLASTY  06/25/2011   Procedure: ARTHROPLASTY BIPOLAR HIP;  Surgeon: Laurice Record [place];  Location: [facility];  Service: Orthopedics;  Laterality: Right;   NASAL SINUS SURGERY     VAGINAL HYSTERECTOMY      Allergies  Allergen Reactions   Azithromycin Other (See Comments)    Nausea, weakness    Aspirin Other (See Comments)    Unknown reaction   Molds & Smuts    Septra [Sulfamethoxazole-Trimethoprim]    Sulfa Antibiotics    Sulfonamide Derivatives Other (See Comments)    Unknown reaction    Allergies as of 03/07/2021       Reactions   Azithromycin Other (See Comments)   Nausea, weakness    Aspirin Other (See Comments)   Unknown reaction   Molds & Smuts    Septra [sulfamethoxazole-trimethoprim]    Sulfa Antibiotics    Sulfonamide Derivatives Other (See Comments)   Unknown reaction        Medication List        Accurate as of March 07, 2021 10:07 AM. If you have any questions, ask your nurse or doctor.          STOP taking these medications    Salonpas 3.08-08-08 % Ptch Generic drug: Camphor-Menthol-Methyl Sal Stopped by: Royal Hawthorn, NP       TAKE these medications    acetaminophen 500 MG tablet Commonly known as: TYLENOL  Take 500 mg by mouth in the morning, at noon, and at bedtime.   budesonide 0.5 MG/2ML nebulizer solution Commonly known as: PULMICORT Take 0.5 mg by nebulization 2 (two) times daily.   CALCIUM 600-D PO Take 1 tablet by mouth daily.   dextromethorphan 30 MG/5ML liquid Commonly known as: DELSYM Take 60 mg by mouth every 12 (twelve) hours as needed for cough.   docusate sodium 100 MG capsule Commonly known as: COLACE Take 100 mg by mouth at bedtime.   Dupixent 300 MG/2ML prefilled syringe Generic drug: dupilumab Inject 1 pen/syringe subcutaneously every other week STARTING ON DAY 15   FLUoxetine 20 MG capsule Commonly known as: PROZAC Take 40 mg by mouth daily.    fluticasone 50 MCG/ACT nasal spray Commonly known as: FLONASE Place 2 sprays into both nostrils daily. 8 am and 11 am   furosemide 20 MG tablet Commonly known as: LASIX Take 20 mg by mouth daily.   hydrOXYzine 25 MG tablet Commonly known as: ATARAX/VISTARIL Take 25 mg by mouth 2 (two) times daily as needed.   ipratropium-albuterol 0.5-2.5 (3) MG/3ML Soln Commonly known as: DUONEB Take 3 mLs by nebulization every 6 (six) hours as needed.   lactose free nutrition Liqd Take 237 mLs by mouth daily.   memantine 10 MG tablet Commonly known as: Namenda Take 1 tablet (10 mg total) by mouth 2 (two) times daily.   montelukast 10 MG tablet Commonly known as: SINGULAIR Take 1 tablet (10 mg total) by mouth daily.   omeprazole 20 MG capsule Commonly known as: PRILOSEC Take 20 mg by mouth 2 (two) times daily.   ondansetron 4 MG tablet Commonly known as: ZOFRAN Take 4 mg by mouth every 8 (eight) hours as needed for nausea or vomiting.   Perforomist 20 MCG/2ML nebulizer solution Generic drug: formoterol Take 20 mcg by nebulization 2 (two) times daily.   polyethylene glycol 17 g packet Commonly known as: MIRALAX / GLYCOLAX Take 17 g by mouth daily as needed.   Systane 0.4-0.3 % Gel ophthalmic gel Generic drug: Polyethyl Glycol-Propyl Glycol Place 1 application into both eyes at bedtime.   triamcinolone cream 0.1 % Commonly known as: KENALOG Apply 1 application topically as needed.   vitamin B-12 1000 MCG tablet Commonly known as: CYANOCOBALAMIN Take 3,000 mcg by mouth daily.   Vitamin D 50 MCG (2000 UT) tablet Take 2,000 Units by mouth at bedtime.        Review of Systems  Constitutional:  Negative for activity change, appetite change, chills, diaphoresis, fatigue, fever and unexpected weight change.  Skin:  Positive for rash. Negative for color change, pallor and wound.   Immunization History  Administered Date(s) Administered   Influenza Inj Mdck Quad Pf  05/18/2019   Influenza Split 04/04/2012, 05/03/2013, 05/10/2015   Influenza Whole 04/18/2008, 06/04/2009, 04/03/2010, 03/30/2011   Influenza, High Dose Seasonal PF 04/20/2018, 05/24/2020   Influenza,inj,Quad PF,6+ Mos 03/26/2017   Influenza-Unspecified 03/26/2010, 04/13/2012, 05/08/2013, 05/14/2014, 06/03/2014, 05/13/2015, 04/27/2016, 04/29/2016   Moderna SARS-COV2 Booster Vaccination 06/13/2020   Moderna Sars-Covid-2 Vaccination 09/05/2019, 10/03/2019   Pneumococcal Conjugate-13 03/05/2014   Pneumococcal Polysaccharide-23 06/04/2009   Pneumococcal-Unspecified 03/13/2009   Td 03/13/2009, 04/13/2012   Zoster Recombinat (Shingrix) 08/03/2017, 10/01/2017   Zoster, Live 03/21/2010   Pertinent  Health Maintenance Due  Topic Date Due   INFLUENZA VACCINE  03/03/2021   PNA vac Low Risk Adult  Completed   DEXA SCAN  Discontinued   Fall Risk  01/11/2020 12/07/2012  Falls in the past year?  0 Yes  Number falls in past yr: 0 2 or more  Injury with Fall? 0 -  Risk Factor Category  - High Fall Risk  Risk for fall due to : - Impaired balance/gait   Functional Status Survey:    Vitals:   03/07/21 0959  BP: 105/61  Pulse: 76  Resp: 16  Temp: 98.4 F (36.9 C)  SpO2: 96%  Weight: 154 lb 3.2 oz (69.9 kg)  Height: 5\' 2"  (1.575 m)   Body mass index is 28.2 kg/m. Physical Exam Vitals and nursing note reviewed.  Constitutional:      Appearance: Normal appearance.  Skin:    General: Skin is warm and dry.     Findings: Rash (annular rash with scaly erythema next to the right axilla) present.  Neurological:     Mental Status: She is alert.    Labs reviewed: Recent Labs    09/23/20 0000 11/22/20 0000  NA 138 138  K 3.9 3.9  CL 102 104  CO2 23* 25*  BUN 35* 25*  CREATININE 1.1 1.0  CALCIUM 8.7 9.2   No results for input(s): AST, ALT, ALKPHOS, BILITOT, PROT, ALBUMIN in the last 8760 hours. Recent Labs    09/23/20 0000  WBC 7.1  HGB 11.8*  HCT 34*  PLT 232   Lab Results   Component Value Date   TSH 1.52 09/23/2020   No results found for: HGBA1C No results found for: CHOL, HDL, LDLCALC, LDLDIRECT, TRIG, CHOLHDL  Significant Diagnostic Results in last 30 days:  No results found.  Assessment/Plan  1. Tinea  - ketoconazole (NIZORAL) 2 % cream Bid x 7 days or until resolved to right arm   Cindi Carbon, [place] [phone]

## 2021-03-11 ENCOUNTER — Other Ambulatory Visit: Payer: Self-pay

## 2021-03-11 ENCOUNTER — Encounter: Payer: Self-pay | Admitting: Pulmonary Disease

## 2021-03-11 ENCOUNTER — Ambulatory Visit (INDEPENDENT_AMBULATORY_CARE_PROVIDER_SITE_OTHER): Payer: Medicare Other | Admitting: Pulmonary Disease

## 2021-03-11 VITALS — BP 122/74 | HR 67 | Temp 97.8°F | Ht 64.0 in | Wt 154.0 lb

## 2021-03-11 DIAGNOSIS — J4550 Severe persistent asthma, uncomplicated: Secondary | ICD-10-CM

## 2021-03-11 DIAGNOSIS — J302 Other seasonal allergic rhinitis: Secondary | ICD-10-CM | POA: Diagnosis not present

## 2021-03-11 MED ORDER — DUPIXENT 300 MG/2ML ~~LOC~~ SOSY: 300.0000 mg | PREFILLED_SYRINGE | SUBCUTANEOUS | 5 refills | Status: DC

## 2021-03-11 NOTE — Patient Instructions (Signed)
Nice to meet you  No changes to medication today  Continue the nebulized treatment  Continue Dupixent on regular schedule  Follow-up with Dr. Silas Flood in 6 months or sooner as needed

## 2021-03-11 NOTE — Addendum Note (Signed)
Addended by: Cassandria Anger on: 03/11/2021 03:39 PM   Modules accepted: Orders

## 2021-03-11 NOTE — Progress Notes (Signed)
@Patient  ID: [name], female    DOB: 04/30/1924, 87 y.o.   MRN: [id number]  Chief Complaint  Patient presents with   New Patient (Initial Visit)    Was a [facility] patient for asthma, daughter and caregiver do endorse coughing at times and some wheezing after nebs treatments, patient feels fine    Referring provider: Virgie Dad, MD  HPI:   9 y.o. woman with asthma here for follow up of the same. Most recent pulmonary note reviewed. Most recent PCP note reviewed.  Doing well. No issues with breathing. Needed prednisone 20 mg daily x 5 days 09/2020 for exacerbation. In setting of missing dupixent doses due to insurance/payor issues. No issues since resuming. On neb ICS/LABA without issue. Daughter thinks sound a little wheezy after treatments but states patient says she does not feel wheezy.   Most recent CXR 01/2018 reviewed and interpreted as clear lungs with evidence of hyperinflation.   PMH: asthma Surgical History: Hip surgery, nasal sinus surgery, hysterectomy Family History: father with colon cancer, no significant respiratory disease in first degree relative  Questionaires / Pulmonary Flowsheets:   ACT:  Asthma Control Test ACT Total Score  03/11/2021 19    MMRC: No flowsheet data found.  Epworth:  No flowsheet data found.  Tests:   FENO:  No results found for: NITRICOXIDE  PFT: No flowsheet data found.  WALK:  No flowsheet data found.  Imaging: Personally reviewed  and as per EMR and discussion in this note  Lab Results: Personally reviewed, stable anemia noted CBC    Component Value Date/Time   WBC 7.1 09/23/2020 0000   WBC 7.2 02/02/2018 1031   RBC 3.76 (A) 09/23/2020 0000   HGB 11.8 (A) 09/23/2020 0000   HCT 34 (A) 09/23/2020 0000   PLT 232 09/23/2020 0000   MCV 89.8 02/02/2018 1031   MCH 30.3 07/24/2017 1822   MCHC 34.4 02/02/2018 1031   RDW 15.3 02/02/2018 1031   LYMPHSABS 0.9 02/02/2018 1031   MONOABS 0.5 02/02/2018 1031    EOSABS 0.7 02/02/2018 1031   BASOSABS 0.0 02/02/2018 1031    BMET    Component Value Date/Time   NA 138 11/22/2020 0000   K 3.9 11/22/2020 0000   CL 104 11/22/2020 0000   CO2 25 (A) 11/22/2020 0000   GLUCOSE 90 02/02/2018 1031   BUN 25 (A) 11/22/2020 0000   CREATININE 1.0 11/22/2020 0000   CREATININE 1.14 02/02/2018 1031   CALCIUM 9.2 11/22/2020 0000   GFRNONAA 59 (L) 07/24/2017 1822   GFRAA >60 07/24/2017 1822    BNP No results found for: BNP  ProBNP    Component Value Date/Time   PROBNP 37.0 02/02/2018 1031    Specialty Problems       Pulmonary Problems   Allergic rhinitis    Qualifier: Diagnosis of  By: Joya Gaskins MD, Burnett Harry        Moderate persistent asthma            Vocal cord dysfunction   Severe persistent asthma without complication    Allergies  Allergen Reactions   Azithromycin Other (See Comments)    Nausea, weakness    Aspirin Other (See Comments)    Unknown reaction   Molds & Smuts    Septra [Sulfamethoxazole-Trimethoprim]    Sulfa Antibiotics    Sulfonamide Derivatives Other (See Comments)    Unknown reaction    Immunization History  Administered Date(s) Administered   Influenza Inj Mdck Quad Pf 05/18/2019  Influenza Split 04/04/2012, 05/03/2013, 05/10/2015   Influenza Whole 04/18/2008, 06/04/2009, 04/03/2010, 03/30/2011   Influenza, High Dose Seasonal PF 04/20/2018, 05/24/2020   Influenza,inj,Quad PF,6+ Mos 03/26/2017   Influenza-Unspecified 03/26/2010, 04/13/2012, 05/08/2013, 05/14/2014, 06/03/2014, 05/13/2015, 04/27/2016, 04/29/2016   Moderna SARS-COV2 Booster Vaccination 06/13/2020   Moderna Sars-Covid-2 Vaccination 09/05/2019, 10/03/2019   Pneumococcal Conjugate-13 03/05/2014   Pneumococcal Polysaccharide-23 06/04/2009   Pneumococcal-Unspecified 03/13/2009   Td 03/13/2009, 04/13/2012   Zoster Recombinat (Shingrix) 08/03/2017, 10/01/2017   Zoster, Live 03/21/2010    Past Medical History:  Diagnosis Date   Age-related  osteoporosis without current pathological fracture    Alzheimer's disease ([place])    with late onset   Arthritis    Asthma    Deficiency of other specified B group vitamins    Depression    Environmental allergies    mold   Fall    GERD without esophagitis    Hyperlipidemia    Hypertension    Hypertensive kidney disease with CKD (chronic kidney disease)    with stage I through stage 4 CKD    Memory loss    Seasonal allergies    Unspecified osteoarthritis, unspecified site     Tobacco History: Social History   Tobacco Use  Smoking Status Never  Smokeless Tobacco Never   Counseling given: Yes   Continue to not smoke  Outpatient Encounter Medications as of 03/11/2021  Medication Sig   acetaminophen (TYLENOL) 500 MG tablet Take 500 mg by mouth in the morning, at noon, and at bedtime.    albuterol (VENTOLIN HFA) 108 (90 Base) MCG/ACT inhaler Inhale into the lungs.   budesonide (PULMICORT) 0.5 MG/2ML nebulizer solution Take 0.5 mg by nebulization 2 (two) times daily.   Calcium Carb-Cholecalciferol (CALCIUM 600-D PO) Take 1 tablet by mouth daily.   Cholecalciferol (VITAMIN D) 2000 units tablet Take 2,000 Units by mouth at bedtime.   dextromethorphan (DELSYM) 30 MG/5ML liquid Take 60 mg by mouth every 12 (twelve) hours as needed for cough.   docusate sodium (COLACE) 100 MG capsule Take 100 mg by mouth at bedtime.    DUPIXENT 300 MG/2ML SOSY Inject 1 pen/syringe subcutaneously every other week STARTING ON DAY 15   FLUoxetine (PROZAC) 20 MG capsule Take 40 mg by mouth daily.   fluticasone (FLONASE) 50 MCG/ACT nasal spray Place 2 sprays into both nostrils daily. 8 am and 11 am   formoterol (PERFOROMIST) 20 MCG/2ML nebulizer solution Take 20 mcg by nebulization 2 (two) times daily.   furosemide (LASIX) 20 MG tablet Take 20 mg by mouth daily.   hydrOXYzine (ATARAX/VISTARIL) 25 MG tablet Take 25 mg by mouth 2 (two) times daily as needed.   hydrOXYzine (VISTARIL) 25 MG capsule Take 25 mg  by mouth 2 (two) times daily.   ipratropium-albuterol (DUONEB) 0.5-2.5 (3) MG/3ML SOLN Take 3 mLs by nebulization every 6 (six) hours as needed.   ketoconazole (NIZORAL) 2 % cream Apply 1 application topically 2 (two) times daily.   lactose free nutrition (BOOST) LIQD Take 237 mLs by mouth daily.   memantine (NAMENDA) 10 MG tablet Take 1 tablet (10 mg total) by mouth 2 (two) times daily.   montelukast (SINGULAIR) 10 MG tablet Take 1 tablet (10 mg total) by mouth daily.   omeprazole (PRILOSEC) 20 MG capsule Take 20 mg by mouth 2 (two) times daily.   ondansetron (ZOFRAN) 4 MG tablet Take 4 mg by mouth every 8 (eight) hours as needed for nausea or vomiting.   Polyethyl Glycol-Propyl Glycol (SYSTANE) 0.4-0.3 % GEL  ophthalmic gel Place 1 application into both eyes at bedtime.   polyethylene glycol (MIRALAX / GLYCOLAX) packet Take 17 g by mouth daily as needed.   triamcinolone cream (KENALOG) 0.1 % Apply 1 application topically as needed.   vitamin B-12 (CYANOCOBALAMIN) 1000 MCG tablet Take 3,000 mcg by mouth daily.   [DISCONTINUED] rivaroxaban (XARELTO) 10 MG TABS tablet Take 1 tablet (10 mg total) by mouth daily.   Facility-Administered Encounter Medications as of 03/11/2021  Medication   ketoconazole (NIZORAL) 2 % cream     Review of Systems  Review of Systems  No chest pain with motion.  No orthopnea or PND.  Comprehensive review of systems otherwise negative. Physical Exam  BP 122/74 (BP Location: Right Arm, Cuff Size: Normal)   Pulse 67   Temp 97.8 F (36.6 C) (Oral)   Ht 5\' 4"  (1.626 m)   Wt 154 lb (69.9 kg)   SpO2 98%   BMI 26.43 kg/m   Wt Readings from Last 5 Encounters:  03/11/21 154 lb (69.9 kg)  03/07/21 154 lb 3.2 oz (69.9 kg)  02/24/21 153 lb 12.8 oz (69.8 kg)  01/02/21 154 lb 12.8 oz (70.2 kg)  12/03/20 152 lb 6.4 oz (69.1 kg)    BMI Readings from Last 5 Encounters:  03/11/21 26.43 kg/m  03/07/21 28.20 kg/m  02/24/21 28.13 kg/m  01/02/21 28.31 kg/m   12/03/20 27.87 kg/m     Physical Exam General: Sitting in wheelchair, no acute distress Eyes: EOMI, no icterus Neck: Supple, no JVD appreciated Cardiovascular: Regular rate and rhythm, no murmur Pulmonary: Clear, no wheeze, normal work of breathing on room air Abdomen: Nondistended, bowel sounds present MSK: No synovitis, no joint effusion Neuro: In wheelchair, no weakness, sensation intact Psych: Normal mood, full affect   Assessment & Plan:   Eosinophilic asthma: On Dupixent with much improved symptom control and exacerbation control.  Short course steroid 09/2020.  To continue Dupixent, nebulized ICS/LABA as well as DuoNebs as needed.  Nasal allergies: s/p surgery in past. Continue montelukast and flonase.   Return in about 6 months (around 09/11/2021).   Lanier Clam, MD 03/11/2021

## 2021-03-11 NOTE — Progress Notes (Signed)
Rx for Dupixent 300mg  every 14 days PFS sent to [facility] with note that Dr. Silas Flood will be managing patient's asthma moving forward  Knox Saliva, PharmD, MPH, BCPS Clinical Pharmacist (Rheumatology and Pulmonology)

## 2021-03-18 ENCOUNTER — Encounter: Payer: Self-pay | Admitting: Orthopedic Surgery

## 2021-03-18 ENCOUNTER — Non-Acute Institutional Stay (SKILLED_NURSING_FACILITY): Payer: Medicare Other | Admitting: Orthopedic Surgery

## 2021-03-18 DIAGNOSIS — N1832 Chronic kidney disease, stage 3b: Secondary | ICD-10-CM | POA: Diagnosis not present

## 2021-03-18 DIAGNOSIS — I10 Essential (primary) hypertension: Secondary | ICD-10-CM | POA: Diagnosis not present

## 2021-03-18 DIAGNOSIS — F02818 Dementia in other diseases classified elsewhere, unspecified severity, with other behavioral disturbance: Secondary | ICD-10-CM

## 2021-03-18 DIAGNOSIS — F325 Major depressive disorder, single episode, in full remission: Secondary | ICD-10-CM

## 2021-03-18 DIAGNOSIS — B359 Dermatophytosis, unspecified: Secondary | ICD-10-CM

## 2021-03-18 DIAGNOSIS — K219 Gastro-esophageal reflux disease without esophagitis: Secondary | ICD-10-CM

## 2021-03-18 DIAGNOSIS — R600 Localized edema: Secondary | ICD-10-CM | POA: Diagnosis not present

## 2021-03-18 DIAGNOSIS — G301 Alzheimer's disease with late onset: Secondary | ICD-10-CM | POA: Diagnosis not present

## 2021-03-18 DIAGNOSIS — F0281 Dementia in other diseases classified elsewhere with behavioral disturbance: Secondary | ICD-10-CM

## 2021-03-18 DIAGNOSIS — J4550 Severe persistent asthma, uncomplicated: Secondary | ICD-10-CM | POA: Diagnosis not present

## 2021-03-18 NOTE — Progress Notes (Signed)
Location:  [facility] Room Number: 121-A Place of Service:  SNF (31) Provider: Yvonna Alanis, NP   Patient Care Team: Virgie Dad, MD as PCP - General (Internal Medicine) Elsie Stain, MD (Pulmonary Disease)  Extended Emergency Contact Information Primary Emergency Contact: Earlene Plater of [place] Phone: [phone] Mobile Phone: [phone] Relation: Daughter  Code Status:  DNR Goals of care: Advanced Directive information Advanced Directives 03/18/2021  Does Patient Have a Medical Advance Directive? Yes  Type of Paramedic of [place];Living will;Out of facility DNR (pink MOST or yellow form)  Does patient want to make changes to medical advance directive? No - Patient declined  Copy of [place] in Chart? Yes - validated most recent copy scanned in chart (See row information)  Pre-existing out of facility DNR order (yellow form or pink MOST form) Yellow form placed in chart (order not valid for inpatient use)     Chief Complaint  Patient presents with   Medical Management of Chronic Issues    Routine visit and discuss need for covid and flu vaccines or exclude     HPI:  Pt is a 87 y.o. female seen today for medical management of chronic diseases.    She currently resides on the skilled nursing unit at [name] Past medical history includes: HTN, asthma, GERD, Alzheimer's, OA, CKD stage IIIb, depression, and chronic fatigue.   Alzheimer's- no recent behavioral outbursts, very pleasant, MMSE 22/30 07/2019, namenda 10 mg bid HTN- BUN/creat 25/1.0 11/22/2020, remains off benicar CKD stage IIIb- BUN/creat 25/1.0 11/22/2020 Asthma- seen by [facility] 08/09 advised 6 month f/u, Pulmicort and duonebs daily, Singulair 10 mg daily OA- pain to bilateral knees and hands, pain worse in morning due to stiffness, tylenol 500 mg bid, ambulates with wheelchair BLE-  furosemide 20 mg daily, TED hose during day, does not elevated lower legs much due to sitting in wheelchair GERD- no recent issues with reflux, hgb 11.8 09/23/2020 Depression- Prozac 20 mg daily Rash- located in right axilla, rash circular, unpleasant if touched, described as burning, she has tried nystatin and triamcinolone without success, ketoconazole cream ordered 08/05 with some improvement, nurse requesting new intervention  No recent falls, injuries or behavioral outbursts.   Recent blood pressures:  08/15- 127/62  08/14- 147/75  08/13- 103/51  Recent weights:  08/01- 154.2 lbs  07/01- 147.2 lbs  06/01- 154.8 lbs  Nurse does not report any other concerns, vitals stable.     Past Medical History:  Diagnosis Date   Age-related osteoporosis without current pathological fracture    Alzheimer's disease ([place])    with late onset   Arthritis    Asthma    Deficiency of other specified B group vitamins    Depression    Environmental allergies    mold   Fall    GERD without esophagitis    Hyperlipidemia    Hypertension    Hypertensive kidney disease with CKD (chronic kidney disease)    with stage I through stage 4 CKD    Memory loss    Seasonal allergies    Unspecified osteoarthritis, unspecified site    Past Surgical History:  Procedure Laterality Date   HIP ARTHROPLASTY  06/25/2011   Procedure: ARTHROPLASTY BIPOLAR HIP;  Surgeon: Laurice Record [place];  Location: [facility];  Service: Orthopedics;  Laterality: Right;   NASAL SINUS SURGERY     VAGINAL HYSTERECTOMY      Allergies  Allergen Reactions  Azithromycin Other (See Comments)    Nausea, weakness    Aspirin Other (See Comments)    Unknown reaction   Molds & Smuts    Septra [Sulfamethoxazole-Trimethoprim]    Sulfa Antibiotics    Sulfonamide Derivatives Other (See Comments)    Unknown reaction    Outpatient Encounter Medications as of 03/18/2021  Medication Sig   acetaminophen (TYLENOL) 500 MG tablet Take 500  mg by mouth in the morning, at noon, and at bedtime.    budesonide (PULMICORT) 0.5 MG/2ML nebulizer solution Take 0.5 mg by nebulization 2 (two) times daily.   Calcium Carb-Cholecalciferol (CALCIUM 600-D PO) Take 1 tablet by mouth daily.   Cholecalciferol (VITAMIN D) 2000 units tablet Take 2,000 Units by mouth at bedtime.   dextromethorphan (DELSYM) 30 MG/5ML liquid Take 60 mg by mouth every 12 (twelve) hours as needed for cough.   docusate sodium (COLACE) 100 MG capsule Take 100 mg by mouth at bedtime.    dupilumab (DUPIXENT) 300 MG/2ML prefilled syringe Inject 300 mg into the skin every 14 (fourteen) days.   FLUoxetine (PROZAC) 20 MG capsule Take 40 mg by mouth daily.   fluticasone (FLONASE) 50 MCG/ACT nasal spray Place 2 sprays into both nostrils daily. 8 am - 11 am   formoterol (PERFOROMIST) 20 MCG/2ML nebulizer solution Take 20 mcg by nebulization 2 (two) times daily.   furosemide (LASIX) 20 MG tablet Take 20 mg by mouth daily.   ipratropium-albuterol (DUONEB) 0.5-2.5 (3) MG/3ML SOLN Take 3 mLs by nebulization every 6 (six) hours as needed.   lactose free nutrition (BOOST) LIQD Take 237 mLs by mouth daily.   memantine (NAMENDA) 10 MG tablet Take 1 tablet (10 mg total) by mouth 2 (two) times daily.   montelukast (SINGULAIR) 10 MG tablet Take 1 tablet (10 mg total) by mouth daily.   nystatin powder Apply 1 application topically 2 (two) times daily.   omeprazole (PRILOSEC) 20 MG capsule Take 20 mg by mouth 2 (two) times daily.   ondansetron (ZOFRAN) 4 MG tablet Take 4 mg by mouth every 8 (eight) hours as needed for nausea or vomiting.   Polyethyl Glycol-Propyl Glycol (SYSTANE) 0.4-0.3 % GEL ophthalmic gel Place 1 application into both eyes at bedtime.   polyethylene glycol (MIRALAX / GLYCOLAX) packet Take 17 g by mouth daily as needed.   triamcinolone cream (KENALOG) 0.1 % Apply 1 application topically as needed.   vitamin B-12 (CYANOCOBALAMIN) 1000 MCG tablet Take 3,000 mcg by mouth daily.    [DISCONTINUED] albuterol (VENTOLIN HFA) 108 (90 Base) MCG/ACT inhaler Inhale into the lungs.   [DISCONTINUED] hydrOXYzine (VISTARIL) 25 MG capsule Take 25 mg by mouth 2 (two) times daily.   [DISCONTINUED] ketoconazole (NIZORAL) 2 % cream Apply 1 application topically 2 (two) times daily.   [DISCONTINUED] rivaroxaban (XARELTO) 10 MG TABS tablet Take 1 tablet (10 mg total) by mouth daily.   No facility-administered encounter medications on file as of 03/18/2021.    Review of Systems  Unable to perform ROS: Dementia   Immunization History  Administered Date(s) Administered   Influenza Inj Mdck Quad Pf 05/18/2019   Influenza Split 04/04/2012, 05/03/2013, 05/10/2015   Influenza Whole 04/18/2008, 06/04/2009, 04/03/2010, 03/30/2011   Influenza, High Dose Seasonal PF 04/20/2018, 05/24/2020   Influenza,inj,Quad PF,6+ Mos 03/26/2017   Influenza-Unspecified 03/26/2010, 04/13/2012, 05/08/2013, 05/14/2014, 06/03/2014, 05/13/2015, 04/27/2016, 04/29/2016   Moderna SARS-COV2 Booster Vaccination 06/13/2020   Moderna Sars-Covid-2 Vaccination 09/05/2019, 10/03/2019   Pneumococcal Conjugate-13 03/05/2014   Pneumococcal Polysaccharide-23 06/04/2009   Pneumococcal-Unspecified 03/13/2009  Td 03/13/2009, 04/13/2012   Zoster Recombinat (Shingrix) 08/03/2017, 10/01/2017   Zoster, Live 03/21/2010   Pertinent  Health Maintenance Due  Topic Date Due   INFLUENZA VACCINE  03/03/2021   PNA vac Low Risk Adult  Completed   DEXA SCAN  Discontinued   Fall Risk  01/11/2020 12/07/2012  Falls in the past year? 0 Yes  Number falls in past yr: 0 2 or more  Injury with Fall? 0 -  Risk Factor Category  - High Fall Risk  Risk for fall due to : - Impaired balance/gait   Functional Status Survey:    Vitals:   03/18/21 1005  BP: 127/62  Pulse: 68  Resp: 16  Temp: 98.4 F (36.9 C)  SpO2: 96%  Weight: 153 lb 9.6 oz (69.7 kg)  Height: 5\' 2"  (1.575 m)   Body mass index is 28.09 kg/m. Physical Exam Vitals  reviewed.  Constitutional:      General: She is not in acute distress. HENT:     Head: Normocephalic.     Right Ear: There is no impacted cerumen.     Left Ear: There is no impacted cerumen.     Ears:     Comments: Bilateral hearing aids    Nose: Nose normal.     Mouth/Throat:     Mouth: Mucous membranes are moist.  Eyes:     General:        Right eye: No discharge.        Left eye: No discharge.  Neck:     Vascular: No carotid bruit.  Cardiovascular:     Rate and Rhythm: Normal rate and regular rhythm.     Pulses: Normal pulses.     Heart sounds: Normal heart sounds. No murmur heard. Pulmonary:     Effort: Pulmonary effort is normal. No respiratory distress.     Breath sounds: Normal breath sounds. No wheezing.  Abdominal:     General: Bowel sounds are normal. There is no distension.     Palpations: Abdomen is soft.     Tenderness: There is no abdominal tenderness.  Musculoskeletal:     Cervical back: Normal range of motion.     Right lower leg: No edema.     Left lower leg: No edema.     Comments: Non-pitting, TED hose   Lymphadenopathy:     Cervical: No cervical adenopathy.  Skin:    General: Skin is warm and dry.     Capillary Refill: Capillary refill takes less than 2 seconds.  Neurological:     General: No focal deficit present.     Mental Status: She is alert. Mental status is at baseline.     Motor: Weakness present.     Gait: Gait abnormal.     Comments: wheelchair  Psychiatric:        Mood and Affect: Mood normal.        Behavior: Behavior normal.    Labs reviewed: Recent Labs    09/23/20 0000 11/22/20 0000  NA 138 138  K 3.9 3.9  CL 102 104  CO2 23* 25*  BUN 35* 25*  CREATININE 1.1 1.0  CALCIUM 8.7 9.2   No results for input(s): AST, ALT, ALKPHOS, BILITOT, PROT, ALBUMIN in the last 8760 hours. Recent Labs    09/23/20 0000  WBC 7.1  HGB 11.8*  HCT 34*  PLT 232   Lab Results  Component Value Date   TSH 1.52 09/23/2020   No results  found for:  HGBA1C No results found for: CHOL, HDL, LDLCALC, LDLDIRECT, TRIG, CHOLHDL  Significant Diagnostic Results in last 30 days:  No results found.  Assessment/Plan 1. Late onset Alzheimer's dementia with behavioral disturbance ([place]) - no recent outbursts - MMSE 22/30 07/2019 - cont namenda 10 mg daily - cont skilled nursing care  2. Essential hypertension - controlled - Benicar recently discontinued  3. Stage 3b chronic kidney disease ([place]) - BUN/creat 25/1.0 11/22/2020 - continue to avoid nephrotoxic drugs like NSAIDS and dose adjust medications to be renally excreted - encourage hydration  4. Severe persistent asthma without complication - followed by pulmonary- recently seen- advised f/u in 6 months - cont pulmicort and duonebs daily - cont Singulair 10 mg daily   5. Gastro-esophageal reflux disease without esophagitis - stable with prilosec 20 mg bid  6. Leg edema - furosemide 20 mg daily - cont ted hose - legs in wheelchair majority of day - recommend leg elevation 2-4 hours/day  7. Major depressive disorder, single episode, in remission ([place]) - stable with Prozac 20 mg daily  8. Tinea - rash appear to be improving  - Ketoconazole cream completed today - recommend leaving area open to air at this time   Family/ staff Communication: plan discussed with patient and nurse  Labs/tests ordered:  none

## 2021-03-25 DIAGNOSIS — Z23 Encounter for immunization: Secondary | ICD-10-CM | POA: Diagnosis not present

## 2021-04-17 ENCOUNTER — Encounter: Payer: Self-pay | Admitting: Adult Health

## 2021-04-17 ENCOUNTER — Non-Acute Institutional Stay (SKILLED_NURSING_FACILITY): Payer: Medicare Other | Admitting: Adult Health

## 2021-04-17 DIAGNOSIS — F0281 Dementia in other diseases classified elsewhere with behavioral disturbance: Secondary | ICD-10-CM

## 2021-04-17 DIAGNOSIS — N1832 Chronic kidney disease, stage 3b: Secondary | ICD-10-CM

## 2021-04-17 DIAGNOSIS — J4550 Severe persistent asthma, uncomplicated: Secondary | ICD-10-CM

## 2021-04-17 DIAGNOSIS — G301 Alzheimer's disease with late onset: Secondary | ICD-10-CM | POA: Diagnosis not present

## 2021-04-17 DIAGNOSIS — I10 Essential (primary) hypertension: Secondary | ICD-10-CM | POA: Diagnosis not present

## 2021-04-17 DIAGNOSIS — K219 Gastro-esophageal reflux disease without esophagitis: Secondary | ICD-10-CM

## 2021-04-17 NOTE — Progress Notes (Signed)
Location:  [facility] Room Number: 121-A Place of Service:  SNF [phone]) Provider:  Royal Hawthorn, NP   Patient Care Team: Virgie Dad, MD as PCP - General (Internal Medicine) Elsie Stain, MD (Pulmonary Disease)  Extended Emergency Contact Information Primary Emergency Contact: Earlene Plater of [place] Phone: [phone] Mobile Phone: [phone] Relation: Daughter  Code Status:  DNR Goals of care: Advanced Directive information Advanced Directives 04/17/2021  Does Patient Have a Medical Advance Directive? Yes  Type of Paramedic of [place];Living will;Out of facility DNR (pink MOST or yellow form)  Does patient want to make changes to medical advance directive? No - Patient declined  Copy of [place] in Chart? Yes - validated most recent copy scanned in chart (See row information)  Pre-existing out of facility DNR order (yellow form or pink MOST form) Yellow form placed in chart (order not valid for inpatient use)     Chief Complaint  Patient presents with   Medical Management of Chronic Issues    Routine visit. Discuss need for flu vaccine or exclude     HPI:  Pt is a 87 y.o. female seen today for medical management of chronic diseases.   PMH significant for asthma, OP, arthritis, Vit d deficiency, depression, dementia, HTN, HLD, hyperlipidemia, seasonal allergies, OA, and GERD.  No acute complaints Wt Readings from Last 3 Encounters:  04/17/21 152 lb 12.8 oz (69.3 kg)  03/18/21 153 lb 9.6 oz (69.7 kg)  03/11/21 154 lb (69.9 kg)  BP stable off benicar No issues with cough or wheezing, continues on nebulizers and dupixent.  Prior hx of OP no recent dexa scan for review. Pt is no ambulatory.  Past Medical History:  Diagnosis Date   Age-related osteoporosis without current pathological fracture    Alzheimer's disease ([place])    with late onset   Arthritis     Asthma    Deficiency of other specified B group vitamins    Depression    Environmental allergies    mold   Fall    GERD without esophagitis    Hyperlipidemia    Hypertension    Hypertensive kidney disease with CKD (chronic kidney disease)    with stage I through stage 4 CKD    Memory loss    Seasonal allergies    Unspecified osteoarthritis, unspecified site    Past Surgical History:  Procedure Laterality Date   HIP ARTHROPLASTY  06/25/2011   Procedure: ARTHROPLASTY BIPOLAR HIP;  Surgeon: Laurice Record [place];  Location: [facility];  Service: Orthopedics;  Laterality: Right;   NASAL SINUS SURGERY     VAGINAL HYSTERECTOMY      Allergies  Allergen Reactions   Azithromycin Other (See Comments)    Nausea, weakness    Aspirin Other (See Comments)    Unknown reaction   Molds & Smuts    Septra [Sulfamethoxazole-Trimethoprim]    Sulfa Antibiotics    Sulfonamide Derivatives Other (See Comments)    Unknown reaction    Outpatient Encounter Medications as of 04/17/2021  Medication Sig   acetaminophen (TYLENOL) 500 MG tablet Take 500 mg by mouth in the morning, at noon, and at bedtime.    budesonide (PULMICORT) 0.5 MG/2ML nebulizer solution Take 0.5 mg by nebulization 2 (two) times daily.   Calcium Carb-Cholecalciferol (CALCIUM 600-D PO) Take 1 tablet by mouth daily.   Cholecalciferol (VITAMIN D) 2000 units tablet Take 2,000 Units by mouth at bedtime.   dextromethorphan (  DELSYM) 30 MG/5ML liquid Take 60 mg by mouth every 12 (twelve) hours as needed for cough.   docusate sodium (COLACE) 100 MG capsule Take 100 mg by mouth at bedtime.    dupilumab (DUPIXENT) 300 MG/2ML prefilled syringe Inject 300 mg into the skin every 14 (fourteen) days.   FLUoxetine (PROZAC) 20 MG capsule Take 40 mg by mouth daily.   fluticasone (FLONASE) 50 MCG/ACT nasal spray Place 2 sprays into both nostrils daily. 8 am - 11 am   formoterol (PERFOROMIST) 20 MCG/2ML nebulizer solution Take 20 mcg by nebulization 2  (two) times daily.   furosemide (LASIX) 20 MG tablet Take 20 mg by mouth daily.   ipratropium-albuterol (DUONEB) 0.5-2.5 (3) MG/3ML SOLN Take 3 mLs by nebulization every 6 (six) hours as needed.   lactose free nutrition (BOOST) LIQD Take 237 mLs by mouth daily.   memantine (NAMENDA) 10 MG tablet Take 1 tablet (10 mg total) by mouth 2 (two) times daily.   montelukast (SINGULAIR) 10 MG tablet Take 1 tablet (10 mg total) by mouth daily.   omeprazole (PRILOSEC) 20 MG capsule Take 20 mg by mouth 2 (two) times daily.   ondansetron (ZOFRAN) 4 MG tablet Take 4 mg by mouth every 8 (eight) hours as needed for nausea or vomiting.   Polyethyl Glycol-Propyl Glycol (SYSTANE) 0.4-0.3 % GEL ophthalmic gel Place 1 application into both eyes at bedtime.   polyethylene glycol (MIRALAX / GLYCOLAX) packet Take 17 g by mouth daily as needed.   triamcinolone cream (KENALOG) 0.1 % Apply 1 application topically as needed.   vitamin B-12 (CYANOCOBALAMIN) 1000 MCG tablet Take 3,000 mcg by mouth daily.   [DISCONTINUED] nystatin powder Apply 1 application topically 2 (two) times daily.   [DISCONTINUED] rivaroxaban (XARELTO) 10 MG TABS tablet Take 1 tablet (10 mg total) by mouth daily.   No facility-administered encounter medications on file as of 04/17/2021.    Review of Systems  Constitutional:  Negative for activity change, appetite change, chills, diaphoresis, fatigue, fever and unexpected weight change.  HENT:  Negative for congestion.   Respiratory:  Negative for cough, shortness of breath and wheezing.   Cardiovascular:  Negative for chest pain, palpitations and leg swelling.  Gastrointestinal:  Negative for abdominal distention, abdominal pain, constipation and diarrhea.  Genitourinary:  Negative for difficulty urinating and dysuria.  Musculoskeletal:  Positive for arthralgias and gait problem. Negative for back pain, joint swelling and myalgias.  Neurological:  Negative for dizziness, tremors, seizures, syncope,  facial asymmetry, speech difficulty, weakness, light-headedness, numbness and headaches.  Psychiatric/Behavioral:  Positive for confusion. Negative for agitation and behavioral problems.    Immunization History  Administered Date(s) Administered   Influenza Inj Mdck Quad Pf 05/18/2019   Influenza Split 04/04/2012, 05/03/2013, 05/10/2015   Influenza Whole 04/18/2008, 06/04/2009, 04/03/2010, 03/30/2011   Influenza, High Dose Seasonal PF 04/20/2018, 05/24/2020   Influenza,inj,Quad PF,6+ Mos 03/26/2017   Influenza-Unspecified 03/26/2010, 04/13/2012, 05/08/2013, 05/14/2014, 06/03/2014, 05/13/2015, 04/27/2016, 04/29/2016   Moderna SARS-COV2 Booster Vaccination 06/13/2020, 03/25/2021   Moderna Sars-Covid-2 Vaccination 09/05/2019, 10/03/2019   Pneumococcal Conjugate-13 03/05/2014   Pneumococcal Polysaccharide-23 06/04/2009   Pneumococcal-Unspecified 03/13/2009   Td 03/13/2009, 04/13/2012   Zoster Recombinat (Shingrix) 08/03/2017, 10/01/2017   Zoster, Live 03/21/2010   Pertinent  Health Maintenance Due  Topic Date Due   INFLUENZA VACCINE  03/03/2021   PNA vac Low Risk Adult  Completed   DEXA SCAN  Discontinued   Fall Risk  01/11/2020 12/07/2012  Falls in the past year? 0 Yes  Number falls  in past yr: 0 2 or more  Injury with Fall? 0 -  Risk Factor Category  - High Fall Risk  Risk for fall due to : - Impaired balance/gait   Functional Status Survey:    Vitals:   04/17/21 1042  BP: (!) 121/44  Pulse: 63  Resp: 16  Temp: 98.4 F (36.9 C)  SpO2: 96%  Weight: 152 lb 12.8 oz (69.3 kg)  Height: 5\' 2"  (1.575 m)   Body mass index is 27.95 kg/m. Physical Exam Vitals and nursing note reviewed.  Constitutional:      General: She is not in acute distress.    Appearance: She is not diaphoretic.  HENT:     Head: Normocephalic and atraumatic.  Neck:     Vascular: No JVD.  Cardiovascular:     Rate and Rhythm: Normal rate and regular rhythm.     Heart sounds: No murmur  heard. Pulmonary:     Effort: Pulmonary effort is normal. No respiratory distress.     Breath sounds: Normal breath sounds. No wheezing.  Abdominal:     General: Bowel sounds are normal. There is no distension.     Palpations: Abdomen is soft.     Tenderness: There is no abdominal tenderness.  Musculoskeletal:     Right lower leg: No edema.     Left lower leg: No edema.  Skin:    General: Skin is warm and dry.  Neurological:     General: No focal deficit present.     Mental Status: She is alert. Mental status is at baseline.  Psychiatric:        Mood and Affect: Mood normal.    Labs reviewed: Recent Labs    09/23/20 0000 11/22/20 0000  NA 138 138  K 3.9 3.9  CL 102 104  CO2 23* 25*  BUN 35* 25*  CREATININE 1.1 1.0  CALCIUM 8.7 9.2   No results for input(s): AST, ALT, ALKPHOS, BILITOT, PROT, ALBUMIN in the last 8760 hours. Recent Labs    09/23/20 0000  WBC 7.1  HGB 11.8*  HCT 34*  PLT 232   Lab Results  Component Value Date   TSH 1.52 09/23/2020   No results found for: HGBA1C No results found for: CHOL, HDL, LDLCALC, LDLDIRECT, TRIG, CHOLHDL  Significant Diagnostic Results in last 30 days:  No results found.  Assessment/Plan  1. Essential hypertension Continues on lasix, off arb BP controlled   2. CKD III b Lab Results  Component Value Date   BUN 25 (A) 11/22/2020   Lab Results  Component Value Date   CREATININE 1.0 11/22/2020   Continue to periodically monitor BMP and avoid nephrotoxic agents   3. Severe persistent asthma without complication Well controlled with current regimen Followed by Pulmonary   4. Late onset Alzheimer's dementia with behavioral disturbance ([place]) Progressive decline in cognition and physical function c/w the disease. Continue supportive care in the skilled environment. Continue Namenda   5. Gastro-esophageal reflux disease without esophagitis Continue Prilosec Due to her hx of asthma/cough/wheeze would not  taper   Family/ staff Communication: nurse  Labs/tests ordered:  NA

## 2021-04-18 ENCOUNTER — Encounter: Payer: Self-pay | Admitting: Adult Health

## 2021-05-07 DIAGNOSIS — Z23 Encounter for immunization: Secondary | ICD-10-CM | POA: Diagnosis not present

## 2021-05-22 ENCOUNTER — Non-Acute Institutional Stay (SKILLED_NURSING_FACILITY): Payer: Medicare Other | Admitting: Adult Health

## 2021-05-22 ENCOUNTER — Encounter: Payer: Self-pay | Admitting: Adult Health

## 2021-05-22 DIAGNOSIS — J4550 Severe persistent asthma, uncomplicated: Secondary | ICD-10-CM | POA: Diagnosis not present

## 2021-05-22 DIAGNOSIS — R051 Acute cough: Secondary | ICD-10-CM | POA: Diagnosis not present

## 2021-05-22 NOTE — Progress Notes (Signed)
Location:  [facility] of Service:  SNF (31) Provider:   Cindi Carbon, [place] [phone]   Julia Dad, MD  Patient Care Team: Julia Dad, MD as PCP - General (Internal Medicine) Elsie Stain, MD (Pulmonary Disease)  Extended Emergency Contact Information Primary Emergency Contact: Earlene Plater of [place] Phone: [phone] Mobile Phone: [phone] Relation: Daughter  Code Status:  DNR Goals of care: Advanced Directive information Advanced Directives 04/17/2021  Does Patient Have a Medical Advance Directive? Yes  Type of Paramedic of [place];Living will;Out of facility DNR (pink MOST or yellow form)  Does patient want to make changes to medical advance directive? No - Patient declined  Copy of [place] in Chart? Yes - validated most recent copy scanned in chart (See row information)  Pre-existing out of facility DNR order (yellow form or pink MOST form) Yellow form placed in chart (order not valid for inpatient use)     Chief Complaint  Patient presents with   Acute Visit    Cough     HPI:  Pt is a 87 y.o. female seen today for an acute visit for cough. [name] has a hx of severe asthma and is followed by pulmonary. She has not had any recent exacerbations and has done quite well. The nurse reports she has a cough for three days with productive sputum. No fever or decreased 02 sats. No sob. The nurse thought she heard rales. She says the resident's daughter is concerned. The resident has dementia but denies any feelings of sob. She did not eat well for the past two days. When I enter the room she is alert taking her nebulizers in no acute distress.   Past Medical History:  Diagnosis Date   Age-related osteoporosis without current pathological fracture    Alzheimer's disease ([place])    with late onset   Arthritis    Asthma     Deficiency of other specified B group vitamins    Depression    Environmental allergies    mold   Fall    GERD without esophagitis    Hyperlipidemia    Hypertension    Hypertensive kidney disease with CKD (chronic kidney disease)    with stage I through stage 4 CKD    Memory loss    Seasonal allergies    Unspecified osteoarthritis, unspecified site    Past Surgical History:  Procedure Laterality Date   HIP ARTHROPLASTY  06/25/2011   Procedure: ARTHROPLASTY BIPOLAR HIP;  Surgeon: Laurice Record [place];  Location: [facility];  Service: Orthopedics;  Laterality: Right;   NASAL SINUS SURGERY     VAGINAL HYSTERECTOMY      Allergies  Allergen Reactions   Azithromycin Other (See Comments)    Nausea, weakness    Aspirin Other (See Comments)    Unknown reaction   Molds & Smuts    Septra [Sulfamethoxazole-Trimethoprim]    Sulfa Antibiotics    Sulfonamide Derivatives Other (See Comments)    Unknown reaction    Outpatient Encounter Medications as of 05/22/2021  Medication Sig   acetaminophen (TYLENOL) 500 MG tablet Take 500 mg by mouth in the morning, at noon, and at bedtime.    budesonide (PULMICORT) 0.5 MG/2ML nebulizer solution Take 0.5 mg by nebulization 2 (two) times daily.   Calcium Carb-Cholecalciferol (CALCIUM 600-D PO) Take 1 tablet by mouth daily.   Cholecalciferol (VITAMIN D) 2000 units tablet Take 2,000  Units by mouth at bedtime.   dextromethorphan (DELSYM) 30 MG/5ML liquid Take 60 mg by mouth every 12 (twelve) hours as needed for cough.   docusate sodium (COLACE) 100 MG capsule Take 100 mg by mouth at bedtime.    dupilumab (DUPIXENT) 300 MG/2ML prefilled syringe Inject 300 mg into the skin every 14 (fourteen) days.   FLUoxetine (PROZAC) 20 MG capsule Take 40 mg by mouth daily.   fluticasone (FLONASE) 50 MCG/ACT nasal spray Place 2 sprays into both nostrils daily. 8 am - 11 am   formoterol (PERFOROMIST) 20 MCG/2ML nebulizer solution Take 20 mcg by nebulization 2 (two) times  daily.   furosemide (LASIX) 20 MG tablet Take 20 mg by mouth daily.   ipratropium-albuterol (DUONEB) 0.5-2.5 (3) MG/3ML SOLN Take 3 mLs by nebulization every 6 (six) hours as needed.   lactose free nutrition (BOOST) LIQD Take 237 mLs by mouth daily.   memantine (NAMENDA) 10 MG tablet Take 1 tablet (10 mg total) by mouth 2 (two) times daily.   montelukast (SINGULAIR) 10 MG tablet Take 1 tablet (10 mg total) by mouth daily.   omeprazole (PRILOSEC) 20 MG capsule Take 20 mg by mouth 2 (two) times daily.   ondansetron (ZOFRAN) 4 MG tablet Take 4 mg by mouth every 8 (eight) hours as needed for nausea or vomiting.   Polyethyl Glycol-Propyl Glycol (SYSTANE) 0.4-0.3 % GEL ophthalmic gel Place 1 application into both eyes at bedtime.   polyethylene glycol (MIRALAX / GLYCOLAX) packet Take 17 g by mouth daily as needed.   triamcinolone cream (KENALOG) 0.1 % Apply 1 application topically as needed.   vitamin B-12 (CYANOCOBALAMIN) 1000 MCG tablet Take 3,000 mcg by mouth daily.   [DISCONTINUED] rivaroxaban (XARELTO) 10 MG TABS tablet Take 1 tablet (10 mg total) by mouth daily.   No facility-administered encounter medications on file as of 05/22/2021.    Review of Systems  Constitutional:  Negative for activity change, appetite change, chills, diaphoresis, fatigue, fever and unexpected weight change.  HENT:  Negative for congestion, rhinorrhea, sinus pressure, sinus pain, sneezing, sore throat, tinnitus and trouble swallowing.   Respiratory:  Positive for cough and wheezing. Negative for shortness of breath.   Cardiovascular:  Negative for chest pain, palpitations and leg swelling.  Gastrointestinal:  Negative for abdominal distention, abdominal pain, constipation and diarrhea.  Genitourinary:  Negative for difficulty urinating and dysuria.  Musculoskeletal:  Positive for gait problem. Negative for arthralgias, back pain, joint swelling and myalgias.  Neurological:  Negative for dizziness, tremors,  seizures, syncope, facial asymmetry, speech difficulty, weakness, light-headedness, numbness and headaches.  Psychiatric/Behavioral:  Positive for confusion. Negative for agitation and behavioral problems.    Immunization History  Administered Date(s) Administered   Influenza Inj Mdck Quad Pf 05/18/2019, 05/07/2021   Influenza Split 04/04/2012, 05/03/2013, 05/10/2015   Influenza Whole 04/18/2008, 06/04/2009, 04/03/2010, 03/30/2011   Influenza, High Dose Seasonal PF 04/20/2018, 05/24/2020   Influenza,inj,Quad PF,6+ Mos 03/26/2017   Influenza-Unspecified 03/26/2010, 04/13/2012, 05/08/2013, 05/14/2014, 06/03/2014, 05/13/2015, 04/27/2016, 04/29/2016   Moderna SARS-COV2 Booster Vaccination 06/13/2020, 03/25/2021, 03/27/2021   Moderna Sars-Covid-2 Vaccination 09/05/2019, 10/03/2019   Pneumococcal Conjugate-13 03/05/2014   Pneumococcal Polysaccharide-23 06/04/2009   Pneumococcal-Unspecified 03/13/2009   Td 03/13/2009, 04/13/2012   Zoster Recombinat (Shingrix) 08/03/2017, 10/01/2017   Zoster, Live 03/21/2010   Pertinent  Health Maintenance Due  Topic Date Due   INFLUENZA VACCINE  Completed   DEXA SCAN  Discontinued   Fall Risk  01/11/2020 12/07/2012  Falls in the past year? 0 Yes  Number  falls in past yr: 0 2 or more  Injury with Fall? 0 -  Risk Factor Category  - High Fall Risk  Risk for fall due to : - Impaired balance/gait   Functional Status Survey:    Vitals:   05/22/21 1641  BP: 139/74  Pulse: 62  Resp: (!) 22  Temp: (!) 97.5 F (36.4 C)  SpO2: 97%  Weight: 154 lb 9.6 oz (70.1 kg)   Body mass index is 28.28 kg/m. Physical Exam Vitals and nursing note reviewed.  Constitutional:      General: She is not in acute distress.    Appearance: She is not diaphoretic.  HENT:     Head: Normocephalic and atraumatic.     Nose: Nose normal. No congestion.     Mouth/Throat:     Mouth: Mucous membranes are moist.     Pharynx: Oropharynx is clear.  Eyes:     Conjunctiva/sclera:  Conjunctivae normal.     Pupils: Pupils are equal, round, and reactive to light.  Neck:     Vascular: No JVD.  Cardiovascular:     Rate and Rhythm: Normal rate and regular rhythm.     Heart sounds: No murmur heard. Pulmonary:     Effort: Pulmonary effort is normal. No respiratory distress.     Breath sounds: Rhonchi present. No wheezing.  Abdominal:     General: Bowel sounds are normal. There is no distension.     Palpations: Abdomen is soft.  Musculoskeletal:     Cervical back: No rigidity or tenderness.     Right lower leg: No edema.     Left lower leg: No edema.  Lymphadenopathy:     Cervical: No cervical adenopathy.  Skin:    General: Skin is warm and dry.  Neurological:     General: No focal deficit present.     Mental Status: She is alert. Mental status is at baseline.  Psychiatric:        Mood and Affect: Mood normal.    Labs reviewed: Recent Labs    09/23/20 0000 11/22/20 0000  NA 138 138  K 3.9 3.9  CL 102 104  CO2 23* 25*  BUN 35* 25*  CREATININE 1.1 1.0  CALCIUM 8.7 9.2   No results for input(s): AST, ALT, ALKPHOS, BILITOT, PROT, ALBUMIN in the last 8760 hours. Recent Labs    09/23/20 0000  WBC 7.1  HGB 11.8*  HCT 34*  PLT 232   Lab Results  Component Value Date   TSH 1.52 09/23/2020   No results found for: HGBA1C No results found for: CHOL, HDL, LDLCALC, LDLDIRECT, TRIG, CHOLHDL  Significant Diagnostic Results in last 30 days:  No results found.  Assessment/Plan 1. Acute cough With abnormal lung sounds, does not appear to be acute distress No signs of increased fluid overload, edema, or weight gain Covid swab  CXR Monitor vitals Duoneb q 6 prn   2. Severe persistent asthma without complication Currently on Brovana, Pulmicort, and Dupixient No recent exacerbations If worsening could add prednisone.     Family/ staff Communication: nurse   Labs/tests ordered:  CXR, covid swab

## 2021-05-23 DIAGNOSIS — R059 Cough, unspecified: Secondary | ICD-10-CM | POA: Diagnosis not present

## 2021-05-27 ENCOUNTER — Encounter: Payer: Self-pay | Admitting: Orthopedic Surgery

## 2021-05-27 ENCOUNTER — Non-Acute Institutional Stay (SKILLED_NURSING_FACILITY): Payer: Medicare Other | Admitting: Orthopedic Surgery

## 2021-05-27 DIAGNOSIS — J4550 Severe persistent asthma, uncomplicated: Secondary | ICD-10-CM | POA: Diagnosis not present

## 2021-05-27 DIAGNOSIS — R051 Acute cough: Secondary | ICD-10-CM | POA: Diagnosis not present

## 2021-05-27 DIAGNOSIS — F0280 Dementia in other diseases classified elsewhere without behavioral disturbance: Secondary | ICD-10-CM | POA: Diagnosis not present

## 2021-05-27 DIAGNOSIS — G301 Alzheimer's disease with late onset: Secondary | ICD-10-CM

## 2021-05-27 NOTE — Progress Notes (Signed)
Location:  [place] Room Number: 867Y Place of Service:  SNF [phone]) Provider:  Windell Moulding, AGNP-C  Virgie Dad, MD  Patient Care Team: Virgie Dad, MD as PCP - General (Internal Medicine) Elsie Stain, MD (Pulmonary Disease)  Extended Emergency Contact Information Primary Emergency Contact: Earlene Plater of [place] Phone: [phone] Mobile Phone: [phone] Relation: Daughter  Code Status:  DNR Goals of care: Advanced Directive information Advanced Directives 04/17/2021  Does Patient Have a Medical Advance Directive? Yes  Type of Paramedic of [place];Living will;Out of facility DNR (pink MOST or yellow form)  Does patient want to make changes to medical advance directive? No - Patient declined  Copy of [place] in Chart? Yes - validated most recent copy scanned in chart (See row information)  Pre-existing out of facility DNR order (yellow form or pink MOST form) Yellow form placed in chart (order not valid for inpatient use)     Chief Complaint  Patient presents with   Acute Visit    Increased behaviors/sundowning    HPI:  Pt is a 87 y.o. female seen today for acute visit due to increased behaviors and sundowning.   She currently resides on the skilled nursing unit at [name] Past medical history includes: HTN, asthma, GERD, Alzheimer's, osteoporosis, CKD 3b, depression.   Nursing reports intermittent agitation in the early evening. Family has paid for sitter to be with her from 5 pm to 7 pm. Sitters presence has helped with anxiety and agitation. Family requesting refill on Vistaril for severe agitation.   Cough has improved in the past few days, vitals stable. She continues to take Brovana, Pulmicort and Dupixient.   No recent falls or injuries.      Past Medical History:  Diagnosis Date   Age-related osteoporosis without current  pathological fracture    Alzheimer's disease ([place])    with late onset   Arthritis    Asthma    Deficiency of other specified B group vitamins    Depression    Environmental allergies    mold   Fall    GERD without esophagitis    Hyperlipidemia    Hypertension    Hypertensive kidney disease with CKD (chronic kidney disease)    with stage I through stage 4 CKD    Memory loss    Seasonal allergies    Unspecified osteoarthritis, unspecified site    Past Surgical History:  Procedure Laterality Date   HIP ARTHROPLASTY  06/25/2011   Procedure: ARTHROPLASTY BIPOLAR HIP;  Surgeon: Laurice Record Aplington;  Location: [facility];  Service: Orthopedics;  Laterality: Right;   NASAL SINUS SURGERY     VAGINAL HYSTERECTOMY      Allergies  Allergen Reactions   Azithromycin Other (See Comments)    Nausea, weakness    Aspirin Other (See Comments)    Unknown reaction   Molds & Smuts    Septra [Sulfamethoxazole-Trimethoprim]    Sulfa Antibiotics    Sulfonamide Derivatives Other (See Comments)    Unknown reaction    Outpatient Encounter Medications as of 05/27/2021  Medication Sig   acetaminophen (TYLENOL) 500 MG tablet Take 500 mg by mouth in the morning, at noon, and at bedtime.    budesonide (PULMICORT) 0.5 MG/2ML nebulizer solution Take 0.5 mg by nebulization 2 (two) times daily.   Calcium Carb-Cholecalciferol (CALCIUM 600-D PO) Take 1 tablet by mouth daily.   Cholecalciferol (VITAMIN D) 2000 units tablet Take 2,000  Units by mouth at bedtime.   dextromethorphan (DELSYM) 30 MG/5ML liquid Take 60 mg by mouth every 12 (twelve) hours as needed for cough.   docusate sodium (COLACE) 100 MG capsule Take 100 mg by mouth at bedtime.    dupilumab (DUPIXENT) 300 MG/2ML prefilled syringe Inject 300 mg into the skin every 14 (fourteen) days.   FLUoxetine (PROZAC) 20 MG capsule Take 40 mg by mouth daily.   fluticasone (FLONASE) 50 MCG/ACT nasal spray Place 2 sprays into both nostrils daily. 8 am - 11 am    formoterol (PERFOROMIST) 20 MCG/2ML nebulizer solution Take 20 mcg by nebulization 2 (two) times daily.   furosemide (LASIX) 20 MG tablet Take 20 mg by mouth daily.   ipratropium-albuterol (DUONEB) 0.5-2.5 (3) MG/3ML SOLN Take 3 mLs by nebulization every 6 (six) hours as needed.   lactose free nutrition (BOOST) LIQD Take 237 mLs by mouth daily.   memantine (NAMENDA) 10 MG tablet Take 1 tablet (10 mg total) by mouth 2 (two) times daily.   montelukast (SINGULAIR) 10 MG tablet Take 1 tablet (10 mg total) by mouth daily.   omeprazole (PRILOSEC) 20 MG capsule Take 20 mg by mouth 2 (two) times daily.   ondansetron (ZOFRAN) 4 MG tablet Take 4 mg by mouth every 8 (eight) hours as needed for nausea or vomiting.   Polyethyl Glycol-Propyl Glycol (SYSTANE) 0.4-0.3 % GEL ophthalmic gel Place 1 application into both eyes at bedtime.   polyethylene glycol (MIRALAX / GLYCOLAX) packet Take 17 g by mouth daily as needed.   triamcinolone cream (KENALOG) 0.1 % Apply 1 application topically as needed.   vitamin B-12 (CYANOCOBALAMIN) 1000 MCG tablet Take 3,000 mcg by mouth daily.   [DISCONTINUED] rivaroxaban (XARELTO) 10 MG TABS tablet Take 1 tablet (10 mg total) by mouth daily.   No facility-administered encounter medications on file as of 05/27/2021.    Review of Systems  Unable to perform ROS: Dementia   Immunization History  Administered Date(s) Administered   Influenza Inj Mdck Quad Pf 05/18/2019, 05/07/2021   Influenza Split 04/04/2012, 05/03/2013, 05/10/2015   Influenza Whole 04/18/2008, 06/04/2009, 04/03/2010, 03/30/2011   Influenza, High Dose Seasonal PF 04/20/2018, 05/24/2020   Influenza,inj,Quad PF,6+ Mos 03/26/2017   Influenza-Unspecified 03/26/2010, 04/13/2012, 05/08/2013, 05/14/2014, 06/03/2014, 05/13/2015, 04/27/2016, 04/29/2016   Moderna SARS-COV2 Booster Vaccination 06/13/2020, 03/25/2021, 03/27/2021   Moderna Sars-Covid-2 Vaccination 09/05/2019, 10/03/2019   Pneumococcal Conjugate-13  03/05/2014   Pneumococcal Polysaccharide-23 06/04/2009   Pneumococcal-Unspecified 03/13/2009   Td 03/13/2009, 04/13/2012   Zoster Recombinat (Shingrix) 08/03/2017, 10/01/2017   Zoster, Live 03/21/2010   Pertinent  Health Maintenance Due  Topic Date Due   INFLUENZA VACCINE  Completed   DEXA SCAN  Discontinued   Fall Risk 12/07/2012 01/11/2020  Falls in the past year? Yes 0  Was there an injury with Fall? - 0  Fall Risk Category Calculator - 0  Fall Risk Category - Low  Patient Fall Risk Level - Low fall risk  Patient at Risk for Falls Due to Impaired balance/gait -   Functional Status Survey:    Vitals:   05/27/21 1428  BP: 116/70  Pulse: 62  Resp: (!) 22  Temp: (!) 97 F (36.1 C)  SpO2: 97%  Weight: 154 lb 9.6 oz (70.1 kg)  Height: 5\' 2"  (1.575 m)   Body mass index is 28.28 kg/m. Physical Exam Vitals reviewed.  Constitutional:      General: She is not in acute distress. HENT:     Head: Normocephalic.  Eyes:  General:        Right eye: No discharge.        Left eye: No discharge.  Neck:     Vascular: No carotid bruit.  Cardiovascular:     Rate and Rhythm: Normal rate and regular rhythm.     Pulses: Normal pulses.     Heart sounds: Normal heart sounds. No murmur heard. Pulmonary:     Effort: Pulmonary effort is normal. No respiratory distress.     Breath sounds: Normal breath sounds. No wheezing, rhonchi or rales.  Abdominal:     General: Bowel sounds are normal. There is no distension.     Palpations: Abdomen is soft.     Tenderness: There is no abdominal tenderness.  Musculoskeletal:     Cervical back: Normal range of motion.     Right lower leg: No edema.     Left lower leg: No edema.  Lymphadenopathy:     Cervical: No cervical adenopathy.  Skin:    General: Skin is warm and dry.     Capillary Refill: Capillary refill takes less than 2 seconds.  Neurological:     General: No focal deficit present.     Mental Status: She is alert. Mental status is  at baseline.     Motor: Weakness present.     Gait: Gait abnormal.  Psychiatric:        Mood and Affect: Mood normal.        Behavior: Behavior normal.        Cognition and Memory: Memory is impaired.     Comments: Very pleasant, follows simple commands.     Labs reviewed: Recent Labs    09/23/20 0000 11/22/20 0000  NA 138 138  K 3.9 3.9  CL 102 104  CO2 23* 25*  BUN 35* 25*  CREATININE 1.1 1.0  CALCIUM 8.7 9.2   No results for input(s): AST, ALT, ALKPHOS, BILITOT, PROT, ALBUMIN in the last 8760 hours. Recent Labs    09/23/20 0000  WBC 7.1  HGB 11.8*  HCT 34*  PLT 232   Lab Results  Component Value Date   TSH 1.52 09/23/2020   No results found for: HGBA1C No results found for: CHOL, HDL, LDLCALC, LDLDIRECT, TRIG, CHOLHDL  Significant Diagnostic Results in last 30 days:  No results found.  Assessment/Plan: 1. Late onset Alzheimer's dementia without behavioral disturbance ([place]) - intermittent agitation/ sundowning - behaviors have improved with sitters pr escence - will start vistaril 25 mg po daily prn x 14 days - cont skilled nursing care  2. Acute cough - lung sounds clear today - will continue to monitor  3. Severe persistent asthma without complication - no recent exacerbations - cont Brovava, Pulmicort and Dupixent    Family/ staff Communication: plan discussed with patient, daughter and nurse  Labs/tests ordered:  none

## 2021-05-28 MED ORDER — HYDROXYZINE PAMOATE 25 MG PO CAPS: 25.0000 mg | ORAL_CAPSULE | Freq: Every day | ORAL | 0 refills | Status: AC | PRN

## 2021-06-09 ENCOUNTER — Encounter: Payer: Self-pay | Admitting: Internal Medicine

## 2021-06-09 ENCOUNTER — Non-Acute Institutional Stay (SKILLED_NURSING_FACILITY): Payer: Medicare Other | Admitting: Internal Medicine

## 2021-06-09 DIAGNOSIS — G301 Alzheimer's disease with late onset: Secondary | ICD-10-CM

## 2021-06-09 DIAGNOSIS — I10 Essential (primary) hypertension: Secondary | ICD-10-CM

## 2021-06-09 DIAGNOSIS — R600 Localized edema: Secondary | ICD-10-CM | POA: Diagnosis not present

## 2021-06-09 DIAGNOSIS — F325 Major depressive disorder, single episode, in full remission: Secondary | ICD-10-CM | POA: Diagnosis not present

## 2021-06-09 DIAGNOSIS — J4550 Severe persistent asthma, uncomplicated: Secondary | ICD-10-CM

## 2021-06-09 DIAGNOSIS — F02818 Dementia in other diseases classified elsewhere, unspecified severity, with other behavioral disturbance: Secondary | ICD-10-CM

## 2021-06-09 DIAGNOSIS — N1832 Chronic kidney disease, stage 3b: Secondary | ICD-10-CM

## 2021-06-09 DIAGNOSIS — K219 Gastro-esophageal reflux disease without esophagitis: Secondary | ICD-10-CM | POA: Diagnosis not present

## 2021-06-09 NOTE — Progress Notes (Signed)
Location:   [place] Room Number: 121 Place of Service:  SNF [phone]) Provider:  Veleta Miners MD   Virgie Dad, MD  Patient Care Team: Virgie Dad, MD as PCP - General (Internal Medicine) Elsie Stain, MD (Pulmonary Disease)  Extended Emergency Contact Information Primary Emergency Contact: Earlene Plater of [place] Phone: [phone] Mobile Phone: [phone] Relation: Daughter  Code Status:  DNR Goals of care: Advanced Directive information Advanced Directives 06/09/2021  Does Patient Have a Medical Advance Directive? Yes  Type of Paramedic of [place];Living will;Out of facility DNR (pink MOST or yellow form)  Does patient want to make changes to medical advance directive? No - Patient declined  Copy of [place] in Chart? Yes - validated most recent copy scanned in chart (See row information)  Pre-existing out of facility DNR order (yellow form or pink MOST form) Yellow form placed in chart (order not valid for inpatient use)     Chief Complaint  Patient presents with   Medical Management of Chronic Issues    HPI:  Pt is a 87 y.o. female seen today for medical management of chronic diseases.    Patient has a history of Alzheimer's dementia dependent for her ADLs Osteoporosis and knee osteoarthritis CKD stage 3b Severe asthma Eosinophilic HLD and hypertension  Patient continues to be Pleasantly confused No New Nursing issues Weight is stable Did have some behavior issues few weeks ago and responded well to Vistaril Prn Also was seen by Pulmonary with no new recommendations  Past Medical History:  Diagnosis Date   Age-related osteoporosis without current pathological fracture    Alzheimer's disease ([place])    with late onset   Arthritis    Asthma    Deficiency of other specified B group vitamins    Depression    Environmental allergies     mold   Fall    GERD without esophagitis    Hyperlipidemia    Hypertension    Hypertensive kidney disease with CKD (chronic kidney disease)    with stage I through stage 4 CKD    Memory loss    Seasonal allergies    Unspecified osteoarthritis, unspecified site    Past Surgical History:  Procedure Laterality Date   HIP ARTHROPLASTY  06/25/2011   Procedure: ARTHROPLASTY BIPOLAR HIP;  Surgeon: Laurice Record [place];  Location: [facility];  Service: Orthopedics;  Laterality: Right;   NASAL SINUS SURGERY     VAGINAL HYSTERECTOMY      Allergies  Allergen Reactions   Azithromycin Other (See Comments)    Nausea, weakness    Aspirin Other (See Comments)    Unknown reaction   Molds & Smuts    Septra [Sulfamethoxazole-Trimethoprim]    Sulfa Antibiotics    Sulfonamide Derivatives Other (See Comments)    Unknown reaction    Allergies as of 06/09/2021       Reactions   Azithromycin Other (See Comments)   Nausea, weakness    Aspirin Other (See Comments)   Unknown reaction   Molds & Smuts    Septra [sulfamethoxazole-trimethoprim]    Sulfa Antibiotics    Sulfonamide Derivatives Other (See Comments)   Unknown reaction        Medication List        Accurate as of June 09, 2021 11:19 AM. If you have any questions, ask your nurse or doctor.          acetaminophen 500 MG  tablet Commonly known as: TYLENOL Take 500 mg by mouth in the morning, at noon, and at bedtime.   budesonide 0.5 MG/2ML nebulizer solution Commonly known as: PULMICORT Take 0.5 mg by nebulization 2 (two) times daily.   CALCIUM 600-D PO Take 1 tablet by mouth daily.   dextromethorphan 30 MG/5ML liquid Commonly known as: DELSYM Take 60 mg by mouth every 12 (twelve) hours as needed for cough.   docusate sodium 100 MG capsule Commonly known as: COLACE Take 100 mg by mouth at bedtime.   Dupixent 300 MG/2ML prefilled syringe Generic drug: dupilumab Inject 300 mg into the skin every 14 (fourteen) days.    FLUoxetine 20 MG capsule Commonly known as: PROZAC Take 40 mg by mouth daily.   fluticasone 50 MCG/ACT nasal spray Commonly known as: FLONASE Place 2 sprays into both nostrils daily. 8 am - 11 am   furosemide 20 MG tablet Commonly known as: LASIX Take 20 mg by mouth daily.   hydrOXYzine 25 MG capsule Commonly known as: VISTARIL Take 1 capsule (25 mg total) by mouth daily as needed for up to 14 days for anxiety.   ipratropium-albuterol 0.5-2.5 (3) MG/3ML Soln Commonly known as: DUONEB Take 3 mLs by nebulization every 6 (six) hours as needed.   lactose free nutrition Liqd Take 237 mLs by mouth daily.   memantine 10 MG tablet Commonly known as: Namenda Take 1 tablet (10 mg total) by mouth 2 (two) times daily.   montelukast 10 MG tablet Commonly known as: SINGULAIR Take 1 tablet (10 mg total) by mouth daily.   omeprazole 20 MG capsule Commonly known as: PRILOSEC Take 20 mg by mouth 2 (two) times daily.   ondansetron 4 MG tablet Commonly known as: ZOFRAN Take 4 mg by mouth every 8 (eight) hours as needed for nausea or vomiting.   Perforomist 20 MCG/2ML nebulizer solution Generic drug: formoterol Take 20 mcg by nebulization 2 (two) times daily.   polyethylene glycol 17 g packet Commonly known as: MIRALAX / GLYCOLAX Take 17 g by mouth daily as needed.   Systane 0.4-0.3 % Gel ophthalmic gel Generic drug: Polyethyl Glycol-Propyl Glycol Place 1 application into both eyes at bedtime.   triamcinolone cream 0.1 % Commonly known as: KENALOG Apply 1 application topically as needed.   vitamin B-12 1000 MCG tablet Commonly known as: CYANOCOBALAMIN Take 3,000 mcg by mouth daily.   Vitamin D 50 MCG (2000 UT) tablet Take 2,000 Units by mouth at bedtime.        Review of Systems  Unable to perform ROS: Dementia (No New issues per Nurses)   Immunization History  Administered Date(s) Administered   Influenza Inj Mdck Quad Pf 05/18/2019, 05/07/2021   Influenza Split  04/04/2012, 05/03/2013, 05/10/2015   Influenza Whole 04/18/2008, 06/04/2009, 04/03/2010, 03/30/2011   Influenza, High Dose Seasonal PF 04/20/2018, 05/24/2020   Influenza,inj,Quad PF,6+ Mos 03/26/2017   Influenza-Unspecified 03/26/2010, 04/13/2012, 05/08/2013, 05/14/2014, 06/03/2014, 05/13/2015, 04/27/2016, 04/29/2016   Moderna SARS-COV2 Booster Vaccination 06/13/2020, 03/25/2021, 03/27/2021   Moderna Sars-Covid-2 Vaccination 09/05/2019, 10/03/2019, 06/13/2020, 03/25/2021, 05/14/2021   Pneumococcal Conjugate-13 03/05/2014   Pneumococcal Polysaccharide-23 06/04/2009   Pneumococcal-Unspecified 03/13/2009   Td 03/13/2009, 04/13/2012   Zoster Recombinat (Shingrix) 08/03/2017, 10/01/2017   Zoster, Live 03/21/2010   Pertinent  Health Maintenance Due  Topic Date Due   INFLUENZA VACCINE  Completed   DEXA SCAN  Discontinued   Fall Risk 12/07/2012 01/11/2020  Falls in the past year? Yes 0  Was there an injury with Fall? - 0  Fall Risk Category Calculator - 0  Fall Risk Category - Low  Patient Fall Risk Level - Low fall risk  Patient at Risk for Falls Due to Impaired balance/gait -   Functional Status Survey:    Vitals:   06/09/21 1051  BP: 125/75  Pulse: (!) 59  Resp: 18  Temp: 99 F (37.2 C)  SpO2: 95%  Weight: 154 lb 9.6 oz (70.1 kg)  Height: 5\' 2"  (1.575 m)   Body mass index is 28.28 kg/m. Physical Exam Vitals reviewed.  Constitutional:      Appearance: Normal appearance.  HENT:     Head: Normocephalic.     Nose: Nose normal.     Mouth/Throat:     Mouth: Mucous membranes are moist.     Pharynx: Oropharynx is clear.  Eyes:     Pupils: Pupils are equal, round, and reactive to light.  Cardiovascular:     Rate and Rhythm: Normal rate and regular rhythm.     Pulses: Normal pulses.  Pulmonary:     Effort: Pulmonary effort is normal. No respiratory distress.     Breath sounds: Normal breath sounds. No wheezing.  Abdominal:     General: Abdomen is flat. Bowel sounds are  normal.     Palpations: Abdomen is soft.  Musculoskeletal:        General: No swelling.     Cervical back: Neck supple.  Skin:    General: Skin is warm.  Neurological:     Mental Status: She is alert.     Comments: Alert Was very Paranoid about my visit today Would not follow much commands  Psychiatric:        Mood and Affect: Mood normal.        Thought Content: Thought content normal.    Labs reviewed: Recent Labs    09/23/20 0000 11/22/20 0000  NA 138 138  K 3.9 3.9  CL 102 104  CO2 23* 25*  BUN 35* 25*  CREATININE 1.1 1.0  CALCIUM 8.7 9.2   No results for input(s): AST, ALT, ALKPHOS, BILITOT, PROT, ALBUMIN in the last 8760 hours. Recent Labs    09/23/20 0000  WBC 7.1  HGB 11.8*  HCT 34*  PLT 232   Lab Results  Component Value Date   TSH 1.52 09/23/2020   No results found for: HGBA1C No results found for: CHOL, HDL, LDLCALC, LDLDIRECT, TRIG, CHOLHDL  Significant Diagnostic Results in last 30 days:  No results found.  Assessment/Plan Essential hypertension BP stable On Low dose of Lasix Late onset Alzheimer's dementia with behavioral disturbance (HCC) On Namenda and Vistaril Prn  Severe persistent asthma without complication Follows with Pulmonary Stable on Pulmicort, Performist, and Dupixent Gastro-esophageal reflux disease without esophagitis Continue on Prilosec Stage 3b chronic kidney disease (HCC) Creat stable Leg edema On Low dose of lasix Major depressive disorder, single episode, in remission ([place]) Continue Prozac   Family/ staff Communication:   Labs/tests ordered:

## 2021-06-18 DIAGNOSIS — D229 Melanocytic nevi, unspecified: Secondary | ICD-10-CM | POA: Diagnosis not present

## 2021-06-18 DIAGNOSIS — D1801 Hemangioma of skin and subcutaneous tissue: Secondary | ICD-10-CM | POA: Diagnosis not present

## 2021-06-18 DIAGNOSIS — L570 Actinic keratosis: Secondary | ICD-10-CM | POA: Diagnosis not present

## 2021-06-18 DIAGNOSIS — L821 Other seborrheic keratosis: Secondary | ICD-10-CM | POA: Diagnosis not present

## 2021-06-24 ENCOUNTER — Other Ambulatory Visit: Payer: Self-pay | Admitting: Orthopedic Surgery

## 2021-06-24 DIAGNOSIS — F419 Anxiety disorder, unspecified: Secondary | ICD-10-CM

## 2021-06-24 DIAGNOSIS — G301 Alzheimer's disease with late onset: Secondary | ICD-10-CM

## 2021-06-24 MED ORDER — HYDROXYZINE PAMOATE 25 MG PO CAPS: 25.0000 mg | ORAL_CAPSULE | Freq: Two times a day (BID) | ORAL | 0 refills | Status: AC | PRN

## 2021-07-01 ENCOUNTER — Encounter: Payer: Self-pay | Admitting: Orthopedic Surgery

## 2021-07-01 ENCOUNTER — Non-Acute Institutional Stay (SKILLED_NURSING_FACILITY): Payer: Medicare Other | Admitting: Orthopedic Surgery

## 2021-07-01 DIAGNOSIS — G301 Alzheimer's disease with late onset: Secondary | ICD-10-CM | POA: Diagnosis not present

## 2021-07-01 DIAGNOSIS — J4550 Severe persistent asthma, uncomplicated: Secondary | ICD-10-CM

## 2021-07-01 DIAGNOSIS — L03116 Cellulitis of left lower limb: Secondary | ICD-10-CM

## 2021-07-01 DIAGNOSIS — N1832 Chronic kidney disease, stage 3b: Secondary | ICD-10-CM

## 2021-07-01 DIAGNOSIS — I10 Essential (primary) hypertension: Secondary | ICD-10-CM | POA: Diagnosis not present

## 2021-07-01 DIAGNOSIS — K219 Gastro-esophageal reflux disease without esophagitis: Secondary | ICD-10-CM

## 2021-07-01 DIAGNOSIS — F02818 Dementia in other diseases classified elsewhere, unspecified severity, with other behavioral disturbance: Secondary | ICD-10-CM | POA: Diagnosis not present

## 2021-07-01 DIAGNOSIS — F325 Major depressive disorder, single episode, in full remission: Secondary | ICD-10-CM | POA: Diagnosis not present

## 2021-07-01 DIAGNOSIS — F419 Anxiety disorder, unspecified: Secondary | ICD-10-CM | POA: Diagnosis not present

## 2021-07-01 NOTE — Progress Notes (Signed)
Location:  [place] Room Number: 121 Place of Service:  SNF [id number]) Provider:  Windell Moulding, AGNP-C  Virgie Dad, MD  Patient Care Team: Virgie Dad, MD as PCP - General (Internal Medicine) Elsie Stain, MD (Pulmonary Disease)  Extended Emergency Contact Information Primary Emergency Contact: Earlene Plater of [place] Phone: [phone] Mobile Phone: [phone] Relation: Daughter  Code Status:  DNR Goals of care: Advanced Directive information Advanced Directives 06/09/2021  Does Patient Have a Medical Advance Directive? Yes  Type of Paramedic of [place];Living will;Out of facility DNR (pink MOST or yellow form)  Does patient want to make changes to medical advance directive? No - Patient declined  Copy of [place] in Chart? Yes - validated most recent copy scanned in chart (See row information)  Pre-existing out of facility DNR order (yellow form or pink MOST form) Yellow form placed in chart (order not valid for inpatient use)     Chief Complaint  Patient presents with   Acute Visit    Left leg infection     HPI:  Pt is a 87 y.o. female seen today for acute visit due to left leg infection.   She currently resides on the skilled nursing unit at [facility] due to AD.   Nurse reports wound to left shin has increased in redness, drainage is also purulent. She is followed by wound nurse and receiving polymem dressing changes every 3 days. Remains afebrile. Denies pain to area.   No recent behavioral outbursts. Follows commands today, very pleasant. Ambulates with Juditta chair. Remains on namenda bid. Vistaril prn for increased anxiety and behaviors.  Remains on Prozac for depression.   No recent asthma exacerbations. Remains on pulmicort and formoterol nebs daily. Also on Singulair.   No recent falls or injuries.   Eating at least 50% of most meals.  Denies reflux.   LBM 11/27, on colace and miralax.   Past Medical History:  Diagnosis Date   Age-related osteoporosis without current pathological fracture    Alzheimer's disease ([place])    with late onset   Arthritis    Asthma    Deficiency of other specified B group vitamins    Depression    Environmental allergies    mold   Fall    GERD without esophagitis    Hyperlipidemia    Hypertension    Hypertensive kidney disease with CKD (chronic kidney disease)    with stage I through stage 4 CKD    Memory loss    Seasonal allergies    Unspecified osteoarthritis, unspecified site    Past Surgical History:  Procedure Laterality Date   HIP ARTHROPLASTY  06/25/2011   Procedure: ARTHROPLASTY BIPOLAR HIP;  Surgeon: Laurice Record Aplington;  Location: [facility];  Service: Orthopedics;  Laterality: Right;   NASAL SINUS SURGERY     VAGINAL HYSTERECTOMY      Allergies  Allergen Reactions   Azithromycin Other (See Comments)    Nausea, weakness    Aspirin Other (See Comments)    Unknown reaction   Molds & Smuts    Septra [Sulfamethoxazole-Trimethoprim]    Sulfa Antibiotics    Sulfonamide Derivatives Other (See Comments)    Unknown reaction    Outpatient Encounter Medications as of 07/01/2021  Medication Sig   acetaminophen (TYLENOL) 500 MG tablet Take 500 mg by mouth in the morning, at noon, and at bedtime.    budesonide (PULMICORT) 0.5 MG/2ML nebulizer solution Take  0.5 mg by nebulization 2 (two) times daily.   Calcium Carb-Cholecalciferol (CALCIUM 600-D PO) Take 1 tablet by mouth daily.   Cholecalciferol (VITAMIN D) 2000 units tablet Take 2,000 Units by mouth at bedtime.   dextromethorphan (DELSYM) 30 MG/5ML liquid Take 60 mg by mouth every 12 (twelve) hours as needed for cough.   docusate sodium (COLACE) 100 MG capsule Take 100 mg by mouth at bedtime.    dupilumab (DUPIXENT) 300 MG/2ML prefilled syringe Inject 300 mg into the skin every 14 (fourteen) days.   FLUoxetine (PROZAC) 20 MG  capsule Take 40 mg by mouth daily.   fluticasone (FLONASE) 50 MCG/ACT nasal spray Place 2 sprays into both nostrils daily. 8 am - 11 am   formoterol (PERFOROMIST) 20 MCG/2ML nebulizer solution Take 20 mcg by nebulization 2 (two) times daily.   furosemide (LASIX) 20 MG tablet Take 20 mg by mouth daily.   hydrOXYzine (VISTARIL) 25 MG capsule Take 1 capsule (25 mg total) by mouth 2 (two) times daily as needed.   ipratropium-albuterol (DUONEB) 0.5-2.5 (3) MG/3ML SOLN Take 3 mLs by nebulization every 6 (six) hours as needed.   lactose free nutrition (BOOST) LIQD Take 237 mLs by mouth daily.   memantine (NAMENDA) 10 MG tablet Take 1 tablet (10 mg total) by mouth 2 (two) times daily.   montelukast (SINGULAIR) 10 MG tablet Take 1 tablet (10 mg total) by mouth daily.   omeprazole (PRILOSEC) 20 MG capsule Take 20 mg by mouth 2 (two) times daily.   ondansetron (ZOFRAN) 4 MG tablet Take 4 mg by mouth every 8 (eight) hours as needed for nausea or vomiting.   Polyethyl Glycol-Propyl Glycol (SYSTANE) 0.4-0.3 % GEL ophthalmic gel Place 1 application into both eyes at bedtime.   polyethylene glycol (MIRALAX / GLYCOLAX) packet Take 17 g by mouth daily as needed.   triamcinolone cream (KENALOG) 0.1 % Apply 1 application topically as needed.   vitamin B-12 (CYANOCOBALAMIN) 1000 MCG tablet Take 3,000 mcg by mouth daily.   [DISCONTINUED] rivaroxaban (XARELTO) 10 MG TABS tablet Take 1 tablet (10 mg total) by mouth daily.   No facility-administered encounter medications on file as of 07/01/2021.    Review of Systems  Immunization History  Administered Date(s) Administered   Influenza Inj Mdck Quad Pf 05/18/2019, 05/07/2021   Influenza Split 04/04/2012, 05/03/2013, 05/10/2015   Influenza Whole 04/18/2008, 06/04/2009, 04/03/2010, 03/30/2011   Influenza, High Dose Seasonal PF 04/20/2018, 05/24/2020   Influenza,inj,Quad PF,6+ Mos 03/26/2017   Influenza-Unspecified 03/26/2010, 04/13/2012, 05/08/2013, 05/14/2014,  06/03/2014, 05/13/2015, 04/27/2016, 04/29/2016   Moderna SARS-COV2 Booster Vaccination 06/13/2020, 03/25/2021, 03/27/2021   Moderna Sars-Covid-2 Vaccination 09/05/2019, 10/03/2019, 06/13/2020, 03/25/2021, 05/14/2021   Pneumococcal Conjugate-13 03/05/2014   Pneumococcal Polysaccharide-23 06/04/2009   Pneumococcal-Unspecified 03/13/2009   Td 03/13/2009, 04/13/2012   Zoster Recombinat (Shingrix) 08/03/2017, 10/01/2017   Zoster, Live 03/21/2010   Pertinent  Health Maintenance Due  Topic Date Due   INFLUENZA VACCINE  Completed   DEXA SCAN  Discontinued   Fall Risk 12/07/2012 01/11/2020  Falls in the past year? Yes 0  Was there an injury with Fall? - 0  Fall Risk Category Calculator - 0  Fall Risk Category - Low  Patient Fall Risk Level - Low fall risk  Patient at Risk for Falls Due to Impaired balance/gait -   Functional Status Survey:    Vitals:   07/01/21 1625  BP: (!) 106/55  Pulse: 72  Resp: 17  Temp: (!) 97.2 F (36.2 C)  SpO2: 94%  Weight: 157  lb (71.2 kg)  Height: 5\' 2"  (1.575 m)   Body mass index is 28.72 kg/m. Physical Exam Vitals reviewed.  Constitutional:      General: She is not in acute distress. HENT:     Head: Normocephalic.  Eyes:     General:        Right eye: No discharge.        Left eye: No discharge.  Neck:     Vascular: No carotid bruit.  Cardiovascular:     Rate and Rhythm: Normal rate and regular rhythm.     Pulses: Normal pulses.     Heart sounds: Normal heart sounds. No murmur heard. Pulmonary:     Effort: Pulmonary effort is normal. No respiratory distress.     Breath sounds: Normal breath sounds. No wheezing.  Abdominal:     General: Bowel sounds are normal. There is no distension.     Palpations: Abdomen is soft.     Tenderness: There is no abdominal tenderness.  Musculoskeletal:     Cervical back: Normal range of motion.     Right lower leg: No edema.     Left lower leg: No edema.  Lymphadenopathy:     Cervical: No cervical  adenopathy.  Skin:    General: Skin is warm and dry.     Capillary Refill: Capillary refill takes less than 2 seconds.     Findings: Lesion present.     Comments: Left shin with quarter sized lesion, wound bed with granulation tissue, small pea sized area above lesion with purulent drainage, non tender, surrounding skin intact. No odor observed.   Neurological:     General: No focal deficit present.     Mental Status: She is alert. Mental status is at baseline.     Motor: Weakness present.     Gait: Gait abnormal.     Comments: Juditta chair  Psychiatric:        Mood and Affect: Mood normal.        Behavior: Behavior normal.    Labs reviewed: Recent Labs    09/23/20 0000 11/22/20 0000  NA 138 138  K 3.9 3.9  CL 102 104  CO2 23* 25*  BUN 35* 25*  CREATININE 1.1 1.0  CALCIUM 8.7 9.2   No results for input(s): AST, ALT, ALKPHOS, BILITOT, PROT, ALBUMIN in the last 8760 hours. Recent Labs    09/23/20 0000  WBC 7.1  HGB 11.8*  HCT 34*  PLT 232   Lab Results  Component Value Date   TSH 1.52 09/23/2020   No results found for: HGBA1C No results found for: CHOL, HDL, LDLCALC, LDLDIRECT, TRIG, CHOLHDL  Significant Diagnostic Results in last 30 days:  No results found.  Assessment/Plan 1. Cellulitis of left lower extremity - quarter sized lesion to left shin- healing - small pea sized lesion above main- purulent drainage - start doxycycline 100 mg po bid x 7 days  2. Late onset Alzheimer's dementia with behavioral disturbance ([place]) - no behavioral outbursts - cont skilled nursing care - cont namenda   3. Essential hypertension - controlled - cont lasix daily  4. Severe persistent asthma without complication - no recent exacerbations - cont Pulmicort and formoterol nebs - cont Singulair  5. Gastro-esophageal reflux disease without esophagitis - denies reflux - cont Prilosec  6. Stage 3b chronic kidney disease ([place]) - continue to avoid nephrotoxic drugs and  dose adjust medications to be renally excreted - encourage hydration with water  7. Anxiety -  no recent panic attacks - cont vistaril prn  8. Major depressive disorder, single episode, in remission ([place]) - stable with Prozac    Family/ staff Communication: plan discussed with patient and nurse  Labs/tests ordered:  none

## 2021-07-02 MED ORDER — DOXYCYCLINE HYCLATE 100 MG PO TABS: 100.0000 mg | ORAL_TABLET | Freq: Two times a day (BID) | ORAL | 0 refills | Status: AC

## 2021-07-07 ENCOUNTER — Non-Acute Institutional Stay (SKILLED_NURSING_FACILITY): Payer: Medicare Other | Admitting: Adult Health

## 2021-07-07 ENCOUNTER — Encounter: Payer: Self-pay | Admitting: Adult Health

## 2021-07-07 DIAGNOSIS — L03116 Cellulitis of left lower limb: Secondary | ICD-10-CM | POA: Diagnosis not present

## 2021-07-07 DIAGNOSIS — N1832 Chronic kidney disease, stage 3b: Secondary | ICD-10-CM

## 2021-07-07 DIAGNOSIS — J4550 Severe persistent asthma, uncomplicated: Secondary | ICD-10-CM

## 2021-07-07 DIAGNOSIS — F02818 Dementia in other diseases classified elsewhere, unspecified severity, with other behavioral disturbance: Secondary | ICD-10-CM

## 2021-07-07 DIAGNOSIS — M810 Age-related osteoporosis without current pathological fracture: Secondary | ICD-10-CM

## 2021-07-07 DIAGNOSIS — R600 Localized edema: Secondary | ICD-10-CM

## 2021-07-07 DIAGNOSIS — I10 Essential (primary) hypertension: Secondary | ICD-10-CM | POA: Diagnosis not present

## 2021-07-07 DIAGNOSIS — G301 Alzheimer's disease with late onset: Secondary | ICD-10-CM

## 2021-07-07 DIAGNOSIS — M542 Cervicalgia: Secondary | ICD-10-CM

## 2021-07-07 NOTE — Progress Notes (Signed)
Location:   [place] Room Number: 333 Place of Service:  SNF [phone]) Provider: Royal Hawthorn, NP  Virgie Dad, MD  Patient Care Team: Virgie Dad, MD as PCP - General (Internal Medicine) Elsie Stain, MD (Pulmonary Disease)  Extended Emergency Contact Information Primary Emergency Contact: Earlene Plater of [place] Phone: [phone] Mobile Phone: [phone] Relation: Daughter  Code Status:  DNR Goals of care: Advanced Directive information Advanced Directives 07/07/2021  Does Patient Have a Medical Advance Directive? Yes  Type of Paramedic of [place];Living will;Out of facility DNR (pink MOST or yellow form)  Does patient want to make changes to medical advance directive? No - Patient declined  Copy of [place] in Chart? Yes - validated most recent copy scanned in chart (See row information)  Pre-existing out of facility DNR order (yellow form or pink MOST form) Yellow form placed in chart (order not valid for inpatient use)     Chief Complaint  Patient presents with   Medical Management of Chronic Issues    HPI:  Pt is a 87 y.o. female seen today for medical management of chronic diseases.   PMH significant for asthma, OP, arthritis, Vit d deficiency, depression, dementia, HTN, HLD, hyperlipidemia, seasonal allergies, OA, and GERD.  No new complaints Currently she is being treated for cellulitis of the LLE with doxycycline (started 11/29) after removal of a benign skin lesion. No pain or fever is noted. Wound is improved per staff  Bps reviewed in matrix Blood Pressure: 102 / 62 mmHg  Blood Pressure: 106 / 55 mmHg  Blood Pressure: 133 / 66 mmHg   Blood Pressure: 126 / 72 mmHg  AD: she is having delusions in the afternoon and increased agitation. Nursing staff admin vistaril with results most evenings or nights.  She is no longer ambulatory and  requires a hoyer lift for all transfers. Has supportive chair. Pleasant during the day time hrs.   Continues on a regular diet, no issues with chewing.  Wt Readings from Last 3 Encounters:  07/07/21 155 lb 6.4 oz (70.5 kg)  07/01/21 157 lb (71.2 kg)  06/09/21 154 lb 9.6 oz (70.1 kg)   Has some neck pain and takes tylenol scheduled which helps  Severe asthma with eosinophilia: no issues with cough or wheeze at this time  Past Medical History:  Diagnosis Date   Age-related osteoporosis without current pathological fracture    Alzheimer's disease ([place])    with late onset   Arthritis    Asthma    Deficiency of other specified B group vitamins    Depression    Environmental allergies    mold   Fall    GERD without esophagitis    Hyperlipidemia    Hypertension    Hypertensive kidney disease with CKD (chronic kidney disease)    with stage I through stage 4 CKD    Memory loss    Seasonal allergies    Unspecified osteoarthritis, unspecified site    Past Surgical History:  Procedure Laterality Date   HIP ARTHROPLASTY  06/25/2011   Procedure: ARTHROPLASTY BIPOLAR HIP;  Surgeon: Laurice Record A[place]ton;  [facility]: [facility];  Service: Orthopedics;  Laterality: Right;   NASAL SINUS SURGERY     VAGINAL HYSTERECTOMY      Allergies  Allergen Reactions   Azithromycin Other (See Comments)    Nausea, weakness    Aspirin Other (See Comments)    Unknown reaction  Molds & Smuts    Septra [Sulfamethoxazole-Trimethoprim]    Sulfa Antibiotics    Sulfonamide Derivatives Other (See Comments)    Unknown reaction    Allergies as of 07/07/2021       Reactions   Azithromycin Other (See Comments)   Nausea, weakness    Aspirin Other (See Comments)   Unknown reaction   Molds & Smuts    Septra [sulfamethoxazole-trimethoprim]    Sulfa Antibiotics    Sulfonamide Derivatives Other (See Comments)   Unknown reaction        Medication List        Accurate as of July 07, 2021 11:24 AM. If  you have any questions, ask your nurse or doctor.          acetaminophen 500 MG tablet Commonly known as: TYLENOL Take 500 mg by mouth in the morning, at noon, and at bedtime.   budesonide 0.5 MG/2ML nebulizer solution Commonly known as: PULMICORT Take 0.5 mg by nebulization 2 (two) times daily.   CALCIUM 600-D PO Take 1 tablet by mouth daily.   dextromethorphan 30 MG/5ML liquid Commonly known as: DELSYM Take 60 mg by mouth every 12 (twelve) hours as needed for cough.   docusate sodium 100 MG capsule Commonly known as: COLACE Take 100 mg by mouth at bedtime.   doxycycline 100 MG tablet Commonly known as: VIBRA-TABS Take 1 tablet (100 mg total) by mouth 2 (two) times daily for 7 days.   Dupixent 300 MG/2ML prefilled syringe Generic drug: dupilumab Inject 300 mg into the skin every 14 (fourteen) days.   FLUoxetine 20 MG capsule Commonly known as: PROZAC Take 40 mg by mouth daily.   fluticasone 50 MCG/ACT nasal spray Commonly known as: FLONASE Place 2 sprays into both nostrils daily. 8 am - 11 am   furosemide 20 MG tablet Commonly known as: LASIX Take 20 mg by mouth daily.   hydrOXYzine 25 MG capsule Commonly known as: VISTARIL Take 1 capsule (25 mg total) by mouth 2 (two) times daily as needed.   ipratropium-albuterol 0.5-2.5 (3) MG/3ML Soln Commonly known as: DUONEB Take 3 mLs by nebulization every 6 (six) hours as needed.   lactose free nutrition Liqd Take 237 mLs by mouth daily.   memantine 10 MG tablet Commonly known as: Namenda Take 1 tablet (10 mg total) by mouth 2 (two) times daily.   montelukast 10 MG tablet Commonly known as: SINGULAIR Take 1 tablet (10 mg total) by mouth daily.   omeprazole 20 MG capsule Commonly known as: PRILOSEC Take 20 mg by mouth 2 (two) times daily.   ondansetron 4 MG tablet Commonly known as: ZOFRAN Take 4 mg by mouth every 8 (eight) hours as needed for nausea or vomiting.   Perforomist 20 MCG/2ML nebulizer  solution Generic drug: formoterol Take 20 mcg by nebulization 2 (two) times daily.   polyethylene glycol 17 g packet Commonly known as: MIRALAX / GLYCOLAX Take 17 g by mouth daily as needed.   Systane 0.4-0.3 % Gel ophthalmic gel Generic drug: Polyethyl Glycol-Propyl Glycol Place 1 application into both eyes at bedtime.   triamcinolone cream 0.1 % Commonly known as: KENALOG Apply 1 application topically as needed.   vitamin B-12 1000 MCG tablet Commonly known as: CYANOCOBALAMIN Take 3,000 mcg by mouth daily.   Vitamin D 50 MCG (2000 UT) tablet Take 2,000 Units by mouth at bedtime.        Review of Systems  Unable to perform ROS: Dementia   Immunization History  Administered Date(s) Administered   Influenza Inj Mdck Quad Pf 05/18/2019, 05/07/2021   Influenza Split 04/04/2012, 05/03/2013, 05/10/2015   Influenza Whole 04/18/2008, 06/04/2009, 04/03/2010, 03/30/2011   Influenza, High Dose Seasonal PF 04/20/2018, 05/24/2020   Influenza,inj,Quad PF,6+ Mos 03/26/2017   Influenza-Unspecified 03/26/2010, 04/13/2012, 05/08/2013, 05/14/2014, 06/03/2014, 05/13/2015, 04/27/2016, 04/29/2016   Moderna SARS-COV2 Booster Vaccination 06/13/2020, 03/25/2021, 03/27/2021   Moderna Sars-Covid-2 Vaccination 09/05/2019, 10/03/2019, 06/13/2020, 03/25/2021, 05/14/2021   Pneumococcal Conjugate-13 03/05/2014   Pneumococcal Polysaccharide-23 06/04/2009   Pneumococcal-Unspecified 03/13/2009   Td 03/13/2009, 04/13/2012   Zoster Recombinat (Shingrix) 08/03/2017, 10/01/2017   Zoster, Live 03/21/2010   Pertinent  Health Maintenance Due  Topic Date Due   INFLUENZA VACCINE  Completed   DEXA SCAN  Discontinued   Fall Risk 12/07/2012 01/11/2020  Falls in the past year? Yes 0  Was there an injury with Fall? - 0  Fall Risk Category Calculator - 0  Fall Risk Category - Low  Patient Fall Risk Level - Low fall risk  Patient at Risk for Falls Due to Impaired balance/gait -   Functional Status Survey:     Vitals:   07/07/21 1110  BP: 102/62  Pulse: 66  Resp: 17  Temp: (!) 97.3 F (36.3 C)  SpO2: 96%  Weight: 155 lb 6.4 oz (70.5 kg)  Height: 5\' 2"  (1.575 m)   Body mass index is 28.42 kg/m. Physical Exam Vitals and nursing note reviewed.  Constitutional:      General: She is not in acute distress.    Appearance: She is not diaphoretic.  HENT:     Head: Normocephalic and atraumatic.     Mouth/Throat:     Mouth: Mucous membranes are moist.     Pharynx: Oropharynx is clear.  Neck:     Vascular: No JVD.  Cardiovascular:     Rate and Rhythm: Normal rate and regular rhythm.     Heart sounds: No murmur heard. Pulmonary:     Effort: Pulmonary effort is normal. No respiratory distress.     Breath sounds: Normal breath sounds. No wheezing.  Abdominal:     General: Bowel sounds are normal. There is no distension.     Palpations: Abdomen is soft.  Musculoskeletal:     Comments: BLE trace  Skin:    General: Skin is warm and dry.     Comments: Left anterior shin: pea sized wound healing with pink tissue 100% scant yellow drainage. Small amt of surrounding erythema. No tenderness.   Neurological:     General: No focal deficit present.     Mental Status: She is alert. Mental status is at baseline.  Psychiatric:        Mood and Affect: Mood normal.    Labs reviewed: Recent Labs    09/23/20 0000 11/22/20 0000  NA 138 138  K 3.9 3.9  CL 102 104  CO2 23* 25*  BUN 35* 25*  CREATININE 1.1 1.0  CALCIUM 8.7 9.2   No results for input(s): AST, ALT, ALKPHOS, BILITOT, PROT, ALBUMIN in the last 8760 hours. Recent Labs    09/23/20 0000  WBC 7.1  HGB 11.8*  HCT 34*  PLT 232   Lab Results  Component Value Date   TSH 1.52 09/23/2020   No results found for: HGBA1C No results found for: CHOL, HDL, LDLCALC, LDLDIRECT, TRIG, CHOLHDL  Significant Diagnostic Results in last 30 days:  No results found.  Assessment/Plan  1. Severe persistent asthma without complication No  recent exacerbations Followed by Pulmonary Continues  on pulmicort, performorist, and dupixent  2. Essential hypertension Controlled Continue lasix  3. Dementia of the Alzheimer's type, with late onset, with delusions ([place]) Moderate to severe Continue vistaril for behaviors as well as namenda.  Continue supportive care  4. Cellulitis of left lower extremity Improving  5. Stage 3b chronic kidney disease ([place]) Continue to periodically monitor BMP and avoid nephrotoxic agents  6. Neck pain Controlled with tylenol  7. Localized edema Mild to BLE Continue lasix and monitor BMP  8. Osteoporosis without current pathological fracture, unspecified osteoporosis type On VIt D and Ca Not ambulatory No need for BMD testing or additional intervention  Family/ staff Communication: nurse  Labs/tests ordered:   CBC BMP

## 2021-07-14 DIAGNOSIS — Z79899 Other long term (current) drug therapy: Secondary | ICD-10-CM | POA: Diagnosis not present

## 2021-07-14 LAB — BASIC METABOLIC PANEL
BUN: 31 — AB (ref 4–21)
CO2: 24 — AB (ref 13–22)
Chloride: 105 (ref 99–108)
Creatinine: 1 (ref 0.5–1.1)
Glucose: 95
Potassium: 4.2 (ref 3.4–5.3)
Sodium: 144 (ref 137–147)

## 2021-07-14 LAB — CBC AND DIFFERENTIAL
HCT: 35 — AB (ref 36–46)
Hemoglobin: 11.2 — AB (ref 12.0–16.0)
Platelets: 236 (ref 150–399)
WBC: 6.8

## 2021-07-14 LAB — CBC: RBC: 3.91 (ref 3.87–5.11)

## 2021-07-14 LAB — COMPREHENSIVE METABOLIC PANEL: Calcium: 9.5 (ref 8.7–10.7)

## 2021-07-18 ENCOUNTER — Telehealth: Payer: Self-pay | Admitting: Pulmonary Disease

## 2021-07-23 ENCOUNTER — Other Ambulatory Visit (HOSPITAL_COMMUNITY): Payer: Self-pay

## 2021-07-23 DIAGNOSIS — D229 Melanocytic nevi, unspecified: Secondary | ICD-10-CM | POA: Diagnosis not present

## 2021-07-23 DIAGNOSIS — L570 Actinic keratosis: Secondary | ICD-10-CM | POA: Diagnosis not present

## 2021-07-23 DIAGNOSIS — D1801 Hemangioma of skin and subcutaneous tissue: Secondary | ICD-10-CM | POA: Diagnosis not present

## 2021-07-23 NOTE — Telephone Encounter (Addendum)
Patient requires new Dupixent prior authorization.  Submitted a Prior Authorization request to [facility] for [place] via CoverMyMeds. Will update once we receive a response.  Key: [facility]  Patient had second plan popping up in eligibility check. Submitted authorization. Submitted a Prior Authorization request to [facility] for [place] via CoverMyMeds. Will update once we receive a response.  Key: [id number]   Knox Saliva, PharmD, MPH, BCPS Clinical Pharmacist (Rheumatology and Pulmonology)

## 2021-07-23 NOTE — Telephone Encounter (Signed)
Received notification from [facility] regarding a prior authorization for [place]. Authorization has been APPROVED from 07/23/21 to 01/21/2022.   Patient can continue to fill through [place] # [id number] Phone # [phone]  Auth through [place] still pending in [facility].  Knox Saliva, PharmD, MPH, BCPS Clinical Pharmacist (Rheumatology and Pulmonology)

## 2021-07-29 NOTE — Telephone Encounter (Signed)
Received fax from [place] stating: "[facility] received a prior authorization request for [place]. This is to inform you that no prior authorization is needed as this is only discount card coverage". Apparently this plan is exclusively a secondary coverage that functions as a discount card that patient is somehow eligible for despite having Medicare Part D. Regardless, [place] renewals will only need to be sent to [facility] going forward. Will send this fax to scan center for retention.

## 2021-08-12 ENCOUNTER — Encounter: Payer: Self-pay | Admitting: Orthopedic Surgery

## 2021-08-12 ENCOUNTER — Non-Acute Institutional Stay (SKILLED_NURSING_FACILITY): Payer: Medicare Other | Admitting: Orthopedic Surgery

## 2021-08-12 DIAGNOSIS — J4550 Severe persistent asthma, uncomplicated: Secondary | ICD-10-CM

## 2021-08-12 DIAGNOSIS — K219 Gastro-esophageal reflux disease without esophagitis: Secondary | ICD-10-CM | POA: Diagnosis not present

## 2021-08-12 DIAGNOSIS — F325 Major depressive disorder, single episode, in full remission: Secondary | ICD-10-CM

## 2021-08-12 DIAGNOSIS — I10 Essential (primary) hypertension: Secondary | ICD-10-CM | POA: Diagnosis not present

## 2021-08-12 DIAGNOSIS — F419 Anxiety disorder, unspecified: Secondary | ICD-10-CM | POA: Diagnosis not present

## 2021-08-12 DIAGNOSIS — N1832 Chronic kidney disease, stage 3b: Secondary | ICD-10-CM | POA: Diagnosis not present

## 2021-08-12 DIAGNOSIS — F02818 Dementia in other diseases classified elsewhere, unspecified severity, with other behavioral disturbance: Secondary | ICD-10-CM | POA: Diagnosis not present

## 2021-08-12 DIAGNOSIS — M799 Soft tissue disorder, unspecified: Secondary | ICD-10-CM | POA: Diagnosis not present

## 2021-08-12 DIAGNOSIS — G301 Alzheimer's disease with late onset: Secondary | ICD-10-CM | POA: Diagnosis not present

## 2021-08-12 NOTE — Progress Notes (Signed)
Location:   [facility] Room Number: 098 Place of Service:  SNF ([phone]) Provider:  Yvonna Alanis, NP   Virgie Dad, MD  Patient Care Team: Virgie Dad, MD as PCP - General (Internal Medicine) Elsie Stain, MD (Pulmonary Disease)  Extended Emergency Contact Information Primary Emergency Contact: Earlene Plater of [place] Phone: [phone] Mobile Phone: [phone] Relation: Daughter  Code Status:  DNR Goals of care: Advanced Directive information Advanced Directives 08/12/2021  Does Patient Have a Medical Advance Directive? Yes  Type of Paramedic of [place];Living will;Out of facility DNR (pink MOST or yellow form)  Does patient want to make changes to medical advance directive? No - Patient declined  Copy of [place] in Chart? Yes - validated most recent copy scanned in chart (See row information)  Pre-existing out of facility DNR order (yellow form or pink MOST form) Yellow form placed in chart (order not valid for inpatient use)     Chief Complaint  Patient presents with   Medical Management of Chronic Issues    Patient has had 5 covid vaccinations according to matrix.    HPI:  Pt is a 88 y.o. female seen today for medical management of chronic diseases.    She currently resides on the skilled nursing unit at [name] PMH: HTN, allergic rhinitis, asthma, GERD, Alzheimer's, OA, osteoporosis, CKD 3b, and depression.   Alzheimer's- CT head 2018 chronic ischemic microvascular disease, caregiver reports increased sleepiness after breakfast, some agitation in the afternoon, poor safety awareness, remains on Namenda and hydroxyzine prn RLE wound- nursing reports 3 pea-sized lesions to right shin, no sign of infection, she has been known to have previous incidents Asthma- nursing staff denies wheezing, remains on Pulmicort and duo nebs, also on Singulair GERD-  hgb 11.2 07/14/2021, remains in Prilosec HTN- BUN/creat 31/1.0 07/14/2021, remains in lasix daily Anxiety- no recent panic attacks, some agitation in afternoons, remains on vistaril prn Depression-  no recent mood changes, very pleasant today, remains on Prozac  No recent falls or injuries. Does not ambulate on her own. Requires hoyer for transfers. Needs assistance with ADLs.   Recent blood pressures:  12/26- 154/85  12/13- 124/55  12/11- 118/69  Recent weights:  01/01- 152.3 lbs  12/01- 155.4 lbs  11/07- 154 lbs    Past Medical History:  Diagnosis Date   Age-related osteoporosis without current pathological fracture    Alzheimer's disease ([place])    with late onset   Arthritis    Asthma    Deficiency of other specified B group vitamins    Depression    Environmental allergies    mold   Fall    GERD without esophagitis    Hyperlipidemia    Hypertension    Hypertensive kidney disease with CKD (chronic kidney disease)    with stage I through stage 4 CKD    Memory loss    Seasonal allergies    Unspecified osteoarthritis, unspecified site    Past Surgical History:  Procedure Laterality Date   HIP ARTHROPLASTY  06/25/2011   Procedure: ARTHROPLASTY BIPOLAR HIP;  Surgeon: Laurice Record Aplington;  Location: [facility];  Service: Orthopedics;  Laterality: Right;   NASAL SINUS SURGERY     VAGINAL HYSTERECTOMY      Allergies  Allergen Reactions   Azithromycin Other (See Comments)    Nausea, weakness    Aspirin Other (See Comments)    Unknown reaction   Molds &  Smuts    Septra [Sulfamethoxazole-Trimethoprim]    Sulfa Antibiotics    Sulfonamide Derivatives Other (See Comments)    Unknown reaction    Allergies as of 08/12/2021       Reactions   Azithromycin Other (See Comments)   Nausea, weakness    Aspirin Other (See Comments)   Unknown reaction   Molds & Smuts    Septra [sulfamethoxazole-trimethoprim]    Sulfa Antibiotics    Sulfonamide Derivatives Other (See Comments)    Unknown reaction        Medication List        Accurate as of August 12, 2021 12:09 PM. If you have any questions, ask your nurse or doctor.          acetaminophen 500 MG tablet Commonly known as: TYLENOL Take 500 mg by mouth in the morning, at noon, and at bedtime.   budesonide 0.5 MG/2ML nebulizer solution Commonly known as: PULMICORT Take 0.5 mg by nebulization 2 (two) times daily.   CALCIUM 600-D PO Take 1 tablet by mouth daily.   dextromethorphan 30 MG/5ML liquid Commonly known as: DELSYM Take 60 mg by mouth every 12 (twelve) hours as needed for cough.   docusate sodium 100 MG capsule Commonly known as: COLACE Take 100 mg by mouth at bedtime.   Dupixent 300 MG/2ML prefilled syringe Generic drug: dupilumab Inject 300 mg into the skin every 14 (fourteen) days.   FLUoxetine 20 MG capsule Commonly known as: PROZAC Take 40 mg by mouth daily.   fluticasone 50 MCG/ACT nasal spray Commonly known as: FLONASE Place 2 sprays into both nostrils daily. 8 am - 11 am   furosemide 20 MG tablet Commonly known as: LASIX Take 20 mg by mouth daily.   hydrOXYzine 25 MG tablet Commonly known as: ATARAX Take 25 mg by mouth 2 (two) times daily as needed.   ipratropium-albuterol 0.5-2.5 (3) MG/3ML Soln Commonly known as: DUONEB Take 3 mLs by nebulization every 6 (six) hours as needed.   lactose free nutrition Liqd Take 237 mLs by mouth daily.   memantine 10 MG tablet Commonly known as: Namenda Take 1 tablet (10 mg total) by mouth 2 (two) times daily.   montelukast 10 MG tablet Commonly known as: SINGULAIR Take 1 tablet (10 mg total) by mouth daily.   omeprazole 20 MG capsule Commonly known as: PRILOSEC Take 20 mg by mouth 2 (two) times daily.   ondansetron 4 MG tablet Commonly known as: ZOFRAN Take 4 mg by mouth every 8 (eight) hours as needed for nausea or vomiting.   Perforomist 20 MCG/2ML nebulizer solution Generic drug: formoterol Take 20 mcg by  nebulization 2 (two) times daily.   polyethylene glycol 17 g packet Commonly known as: MIRALAX / GLYCOLAX Take 17 g by mouth daily as needed.   Systane 0.4-0.3 % Gel ophthalmic gel Generic drug: Polyethyl Glycol-Propyl Glycol Place 1 application into both eyes at bedtime.   triamcinolone cream 0.1 % Commonly known as: KENALOG Apply 1 application topically as needed.   vitamin B-12 1000 MCG tablet Commonly known as: CYANOCOBALAMIN Take 3,000 mcg by mouth daily.   Vitamin D 50 MCG (2000 UT) tablet Take 2,000 Units by mouth at bedtime.        Review of Systems  Unable to perform ROS: Dementia   Immunization History  Administered Date(s) Administered   Influenza Inj Mdck Quad Pf 05/18/2019, 05/07/2021   Influenza Split 04/04/2012, 05/03/2013, 05/10/2015   Influenza Whole 04/18/2008, 06/04/2009, 04/03/2010, 03/30/2011   Influenza,  High Dose Seasonal PF 04/20/2018, 05/24/2020   Influenza,inj,Quad PF,6+ Mos 03/26/2017   Influenza-Unspecified 03/26/2010, 04/13/2012, 05/08/2013, 05/14/2014, 06/03/2014, 05/13/2015, 04/27/2016, 04/29/2016   Moderna SARS-COV2 Booster Vaccination 06/13/2020, 03/25/2021, 03/27/2021   Moderna Sars-Covid-2 Vaccination 09/05/2019, 10/03/2019, 06/13/2020, 03/25/2021, 05/14/2021   Pneumococcal Conjugate-13 03/05/2014   Pneumococcal Polysaccharide-23 06/04/2009   Pneumococcal-Unspecified 03/13/2009   Td 03/13/2009, 04/13/2012   Zoster Recombinat (Shingrix) 08/03/2017, 10/01/2017   Zoster, Live 03/21/2010   Pertinent  Health Maintenance Due  Topic Date Due   INFLUENZA VACCINE  Completed   DEXA SCAN  Discontinued   Fall Risk 12/07/2012 01/11/2020  Falls in the past year? Yes 0  Was there an injury with Fall? - 0  Fall Risk Category Calculator - 0  Fall Risk Category - Low  Patient Fall Risk Level - Low fall risk  Patient at Risk for Falls Due to Impaired balance/gait -   Functional Status Survey:    Vitals:   08/12/21 1153  BP: (!) 154/85   Pulse: 68  Resp: 16  Temp: (!) 97.2 F (36.2 C)  SpO2: 99%  Weight: 152 lb 4.8 oz (69.1 kg)  Height: 5\' 2"  (1.575 m)   Body mass index is 27.86 kg/m. Physical Exam Vitals reviewed.  Constitutional:      General: She is not in acute distress. HENT:     Head: Normocephalic.     Right Ear: There is no impacted cerumen.     Left Ear: There is no impacted cerumen.     Ears:     Comments: Bilateral hearing aids    Nose: Nose normal.     Mouth/Throat:     Mouth: Mucous membranes are moist.  Eyes:     General:        Right eye: No discharge.        Left eye: No discharge.  Neck:     Vascular: No carotid bruit.  Cardiovascular:     Rate and Rhythm: Normal rate and regular rhythm.     Pulses: Normal pulses.     Heart sounds: Normal heart sounds.  Pulmonary:     Effort: Pulmonary effort is normal. No respiratory distress.     Breath sounds: Normal breath sounds. No wheezing.  Abdominal:     General: Bowel sounds are normal. There is no distension.     Palpations: Abdomen is soft.     Tenderness: There is no abdominal tenderness.  Musculoskeletal:     Cervical back: Normal range of motion.     Right lower leg: No edema.     Left lower leg: No edema.  Lymphadenopathy:     Cervical: No cervical adenopathy.  Skin:    General: Skin is warm and dry.     Capillary Refill: Capillary refill takes less than 2 seconds.     Findings: Lesion present.     Comments: 3 pea sized lesions to right shin, skin appears dry, scab intact, no drainage, surrounding skin intact.   Neurological:     General: No focal deficit present.     Mental Status: She is alert. Mental status is at baseline.     Motor: Weakness present.     Gait: Gait abnormal.     Comments: wheelchair  Psychiatric:        Mood and Affect: Mood normal.        Behavior: Behavior normal.        Cognition and Memory: Memory is impaired.     Comments: Very pleasant, follows commands  Labs reviewed: Recent Labs     09/23/20 0000 11/22/20 0000 07/14/21 0000  NA 138 138 144  K 3.9 3.9 4.2  CL 102 104 105  CO2 23* 25* 24*  BUN 35* 25* 31*  CREATININE 1.1 1.0 1.0  CALCIUM 8.7 9.2 9.5   No results for input(s): AST, ALT, ALKPHOS, BILITOT, PROT, ALBUMIN in the last 8760 hours. Recent Labs    09/23/20 0000 07/14/21 0000  WBC 7.1 6.8  HGB 11.8* 11.2*  HCT 34* 35*  PLT 232 236   Lab Results  Component Value Date   TSH 1.52 09/23/2020   No results found for: HGBA1C No results found for: CHOL, HDL, LDLCALC, LDLDIRECT, TRIG, CHOLHDL  Significant Diagnostic Results in last 30 days:  No results found.  Assessment/Plan 1. Dementia of the Alzheimer's type, with late onset, with delusions ([place]) - some agitation in afternoon - sleeping more during day - cont skilled nursing care - cont Namenda  2. Soft tissue lesion - 3 pea sized lesions to right shin, no sign of infection - ? Venous stasis ulcers- similar lesions in past  3. Severe persistent asthma without complication - lung sounds clear in all lung fields - cont Pulmicort, Duonebs and Singulair  4. Gastro-esophageal reflux disease without esophagitis - hgb stable - cont Prilosec  5. Essential hypertension - controlled - cont lasix 6. Stage 3b chronic kidney disease ([place]) - continue to avoid nephrotoxic drugs like NSAIDS and dose adjust medications to be renally excreted  7. Anxiety - no panic attacks - cont vistaril prn  8. Major depressive disorder, single episode, in remission ([place]) - no mood changes - cont Prozac    Family/ staff Communication: plan discussed with patient and nurse  Labs/tests ordered:  none

## 2021-08-20 DIAGNOSIS — L821 Other seborrheic keratosis: Secondary | ICD-10-CM | POA: Diagnosis not present

## 2021-08-20 DIAGNOSIS — D229 Melanocytic nevi, unspecified: Secondary | ICD-10-CM | POA: Diagnosis not present

## 2021-08-20 DIAGNOSIS — L570 Actinic keratosis: Secondary | ICD-10-CM | POA: Diagnosis not present

## 2021-09-08 ENCOUNTER — Encounter: Payer: Self-pay | Admitting: Internal Medicine

## 2021-09-08 NOTE — Progress Notes (Unsigned)
Location:    [facility] Room Number: 121 Place of Service:  SNF [phone]) Provider:  Veleta Miners MD  Virgie Dad, MD  Patient Care Team: Virgie Dad, MD as PCP - General (Internal Medicine) Elsie Stain, MD (Pulmonary Disease)  Extended Emergency Contact Information Primary Emergency Contact: Earlene Plater of [place] Phone: [phone] Mobile Phone: [phone] Relation: Daughter  Code Status:  DNR Goals of care: Advanced Directive information Advanced Directives 09/08/2021  Does Patient Have a Medical Advance Directive? Yes  Type of Paramedic of [place];Living will;Out of facility DNR (pink MOST or yellow form)  Does patient want to make changes to medical advance directive? No - Patient declined  Copy of [place] in Chart? Yes - validated most recent copy scanned in chart (See row information)  Pre-existing out of facility DNR order (yellow form or pink MOST form) Yellow form placed in chart (order not valid for inpatient use)     Chief Complaint  Patient presents with   Medical Management of Chronic Issues    HPI:  Pt is a 88 y.o. female seen today for medical management of chronic diseases.     Past Medical History:  Diagnosis Date   Age-related osteoporosis without current pathological fracture    Alzheimer's disease ([place])    with late onset   Arthritis    Asthma    Deficiency of other specified B group vitamins    Depression    Environmental allergies    mold   Fall    GERD without esophagitis    Hyperlipidemia    Hypertension    Hypertensive kidney disease with CKD (chronic kidney disease)    with stage I through stage 4 CKD    Memory loss    Seasonal allergies    Unspecified osteoarthritis, unspecified site    Past Surgical History:  Procedure Laterality Date   HIP ARTHROPLASTY  06/25/2011   Procedure: ARTHROPLASTY BIPOLAR HIP;   Surgeon: Laurice Record [place];  Location: [facility];  Service: Orthopedics;  Laterality: Right;   NASAL SINUS SURGERY     VAGINAL HYSTERECTOMY      Allergies  Allergen Reactions   Azithromycin Other (See Comments)    Nausea, weakness    Aspirin Other (See Comments)    Unknown reaction   Molds & Smuts    Septra [Sulfamethoxazole-Trimethoprim]    Sulfa Antibiotics    Sulfonamide Derivatives Other (See Comments)    Unknown reaction    Allergies as of 09/08/2021       Reactions   Azithromycin Other (See Comments)   Nausea, weakness    Aspirin Other (See Comments)   Unknown reaction   Molds & Smuts    Septra [sulfamethoxazole-trimethoprim]    Sulfa Antibiotics    Sulfonamide Derivatives Other (See Comments)   Unknown reaction        Medication List        Accurate as of September 08, 2021 10:55 AM. If you have any questions, ask your nurse or doctor.          acetaminophen 500 MG tablet Commonly known as: TYLENOL Take 500 mg by mouth in the morning, at noon, and at bedtime.   budesonide 0.5 MG/2ML nebulizer solution Commonly known as: PULMICORT Take 0.5 mg by nebulization 2 (two) times daily.   CALCIUM 600-D PO Take 1 tablet by mouth daily.   dextromethorphan 30 MG/5ML liquid Commonly known as: DELSYM Take 60  mg by mouth every 12 (twelve) hours as needed for cough.   docusate sodium 100 MG capsule Commonly known as: COLACE Take 100 mg by mouth at bedtime.   Dupixent 300 MG/2ML prefilled syringe Generic drug: dupilumab Inject 300 mg into the skin every 14 (fourteen) days.   FLUoxetine 20 MG capsule Commonly known as: PROZAC Take 40 mg by mouth daily.   fluticasone 50 MCG/ACT nasal spray Commonly known as: FLONASE Place 2 sprays into both nostrils daily. 8 am - 11 am   furosemide 20 MG tablet Commonly known as: LASIX Take 20 mg by mouth daily.   hydrOXYzine 25 MG tablet Commonly known as: ATARAX Take 25 mg by mouth 2 (two) times daily as needed.    ipratropium-albuterol 0.5-2.5 (3) MG/3ML Soln Commonly known as: DUONEB Take 3 mLs by nebulization every 6 (six) hours as needed.   lactose free nutrition Liqd Take 237 mLs by mouth daily.   memantine 10 MG tablet Commonly known as: Namenda Take 1 tablet (10 mg total) by mouth 2 (two) times daily.   montelukast 10 MG tablet Commonly known as: SINGULAIR Take 1 tablet (10 mg total) by mouth daily.   omeprazole 20 MG capsule Commonly known as: PRILOSEC Take 20 mg by mouth 2 (two) times daily.   ondansetron 4 MG tablet Commonly known as: ZOFRAN Take 4 mg by mouth every 8 (eight) hours as needed for nausea or vomiting.   Perforomist 20 MCG/2ML nebulizer solution Generic drug: formoterol Take 20 mcg by nebulization 2 (two) times daily.   polyethylene glycol 17 g packet Commonly known as: MIRALAX / GLYCOLAX Take 17 g by mouth daily as needed.   Systane 0.4-0.3 % Gel ophthalmic gel Generic drug: Polyethyl Glycol-Propyl Glycol Place 1 application into both eyes at bedtime.   triamcinolone cream 0.1 % Commonly known as: KENALOG Apply 1 application topically as needed.   vitamin B-12 1000 MCG tablet Commonly known as: CYANOCOBALAMIN Take 3,000 mcg by mouth daily.   Vitamin D 50 MCG (2000 UT) tablet Take 2,000 Units by mouth at bedtime.        Review of Systems  Immunization History  Administered Date(s) Administered   Influenza Inj Mdck Quad Pf 05/18/2019, 05/07/2021   Influenza Split 04/04/2012, 05/03/2013, 05/10/2015   Influenza Whole 04/18/2008, 06/04/2009, 04/03/2010, 03/30/2011   Influenza, High Dose Seasonal PF 04/20/2018, 05/24/2020   Influenza,inj,Quad PF,6+ Mos 03/26/2017   Influenza-Unspecified 03/26/2010, 04/13/2012, 05/08/2013, 05/14/2014, 06/03/2014, 05/13/2015, 04/27/2016, 04/29/2016   Moderna SARS-COV2 Booster Vaccination 06/13/2020, 03/25/2021, 03/27/2021   Moderna Sars-Covid-2 Vaccination 09/05/2019, 10/03/2019, 06/13/2020, 03/25/2021, 05/14/2021    Pneumococcal Conjugate-13 03/05/2014   Pneumococcal Polysaccharide-23 06/04/2009   Pneumococcal-Unspecified 03/13/2009   Td 03/13/2009, 04/13/2012   Zoster Recombinat (Shingrix) 08/03/2017, 10/01/2017   Zoster, Live 03/21/2010   Pertinent  Health Maintenance Due  Topic Date Due   INFLUENZA VACCINE  Completed   DEXA SCAN  Discontinued   Fall Risk 12/07/2012 01/11/2020  Falls in the past year? Yes 0  Was there an injury with Fall? - 0  Fall Risk Category Calculator - 0  Fall Risk Category - Low  Patient Fall Risk Level - Low fall risk  Patient at Risk for Falls Due to Impaired balance/gait -   Functional Status Survey:    Vitals:   09/08/21 1047  BP: 127/71  Pulse: 62  Resp: 16  Temp: 98.2 F (36.8 C)  SpO2: 99%  Weight: 154 lb 3.2 oz (69.9 kg)  Height: 5\' 2"  (1.575 m)   Body  mass index is 28.2 kg/m. Physical Exam  Labs reviewed: Recent Labs    09/23/20 0000 11/22/20 0000 07/14/21 0000  NA 138 138 144  K 3.9 3.9 4.2  CL 102 104 105  CO2 23* 25* 24*  BUN 35* 25* 31*  CREATININE 1.1 1.0 1.0  CALCIUM 8.7 9.2 9.5   No results for input(s): AST, ALT, ALKPHOS, BILITOT, PROT, ALBUMIN in the last 8760 hours. Recent Labs    09/23/20 0000 07/14/21 0000  WBC 7.1 6.8  HGB 11.8* 11.2*  HCT 34* 35*  PLT 232 236   Lab Results  Component Value Date   TSH 1.52 09/23/2020   No results found for: HGBA1C No results found for: CHOL, HDL, LDLCALC, LDLDIRECT, TRIG, CHOLHDL  Significant Diagnostic Results in last 30 days:  No results found.  Assessment/Plan There are no diagnoses linked to this encounter.   Family/ staff Communication:   Labs/tests ordered:

## 2021-09-10 ENCOUNTER — Ambulatory Visit (INDEPENDENT_AMBULATORY_CARE_PROVIDER_SITE_OTHER): Payer: Medicare Other | Admitting: Pulmonary Disease

## 2021-09-10 ENCOUNTER — Ambulatory Visit (INDEPENDENT_AMBULATORY_CARE_PROVIDER_SITE_OTHER): Payer: Medicare Other

## 2021-09-10 ENCOUNTER — Encounter: Payer: Self-pay | Admitting: Pulmonary Disease

## 2021-09-10 ENCOUNTER — Other Ambulatory Visit: Payer: Self-pay

## 2021-09-10 VITALS — BP 122/72 | HR 62 | Temp 98.2°F

## 2021-09-10 DIAGNOSIS — R051 Acute cough: Secondary | ICD-10-CM | POA: Diagnosis not present

## 2021-09-10 DIAGNOSIS — R059 Cough, unspecified: Secondary | ICD-10-CM | POA: Diagnosis not present

## 2021-09-10 DIAGNOSIS — J4540 Moderate persistent asthma, uncomplicated: Secondary | ICD-10-CM | POA: Diagnosis not present

## 2021-09-10 NOTE — Progress Notes (Signed)
@Patient  ID: [name], female    DOB: Jun 03, 1924, 88 y.o.   MRN: [id number]  Chief Complaint  Patient presents with   Follow-up    6 month follow up. Pt states that sh has congestion, cough, voice changing. Monday Covid test was negative. Asthma has been doing well.     Referring provider: Virgie Dad, MD  HPI:   88 y.o. woman with asthma here for follow up of the same with chief complaint of acute cough.  Overall had been doing well. No issues with asthma, cough or DOE.  Reports good adherence to nebulized therapies as well as Dupixent.  However, since this weekend cough.  Productive.  Frequent throughout the day.  Negative COVID test as of 09/08/2021.  No fever or chills.  PMH: asthma Surgical History: Hip surgery, nasal sinus surgery, hysterectomy Family History: father with colon cancer, no significant respiratory disease in first degree relative  Questionaires / Pulmonary Flowsheets:   ACT:  Asthma Control Test ACT Total Score  03/11/2021 19    MMRC: No flowsheet data found.  Epworth:  No flowsheet data found.  Tests:   FENO:  No results found for: NITRICOXIDE  PFT: No flowsheet data found.  WALK:  No flowsheet data found.  Imaging: Personally reviewed  and as per EMR and discussion in this note  Lab Results: Personally reviewed, stable anemia noted CBC    Component Value Date/Time   WBC 6.8 07/14/2021 0000   WBC 7.2 02/02/2018 1031   RBC 3.91 07/14/2021 0000   HGB 11.2 (A) 07/14/2021 0000   HCT 35 (A) 07/14/2021 0000   PLT 236 07/14/2021 0000   MCV 89.8 02/02/2018 1031   MCH 30.3 07/24/2017 1822   MCHC 34.4 02/02/2018 1031   RDW 15.3 02/02/2018 1031   LYMPHSABS 0.9 02/02/2018 1031   MONOABS 0.5 02/02/2018 1031   EOSABS 0.7 02/02/2018 1031   BASOSABS 0.0 02/02/2018 1031    BMET    Component Value Date/Time   NA 144 07/14/2021 0000   K 4.2 07/14/2021 0000   CL 105 07/14/2021 0000   CO2 24 (A) 07/14/2021 0000   GLUCOSE 90  02/02/2018 1031   BUN 31 (A) 07/14/2021 0000   CREATININE 1.0 07/14/2021 0000   CREATININE 1.14 02/02/2018 1031   CALCIUM 9.5 07/14/2021 0000   GFRNONAA 59 (L) 07/24/2017 1822   GFRAA >60 07/24/2017 1822    BNP No results found for: BNP  ProBNP    Component Value Date/Time   PROBNP 37.0 02/02/2018 1031    Specialty Problems       Pulmonary Problems   Allergic rhinitis    Qualifier: Diagnosis of  By: Joya Gaskins MD, Burnett Harry       Moderate persistent asthma           Vocal cord dysfunction   Severe persistent asthma without complication    Allergies  Allergen Reactions   Azithromycin Other (See Comments)    Nausea, weakness    Aspirin Other (See Comments)    Unknown reaction   Molds & Smuts    Septra [Sulfamethoxazole-Trimethoprim]    Sulfa Antibiotics    Sulfonamide Derivatives Other (See Comments)    Unknown reaction    Immunization History  Administered Date(s) Administered   Influenza Inj Mdck Quad Pf 05/18/2019, 05/07/2021   Influenza Split 04/04/2012, 05/03/2013, 05/10/2015   Influenza Whole 04/18/2008, 06/04/2009, 04/03/2010, 03/30/2011   Influenza, High Dose Seasonal PF 04/20/2018, 05/24/2020   Influenza,inj,Quad PF,6+ Mos 03/26/2017  Influenza-Unspecified 03/26/2010, 04/13/2012, 05/08/2013, 05/14/2014, 06/03/2014, 05/13/2015, 04/27/2016, 04/29/2016   Moderna SARS-COV2 Booster Vaccination 06/13/2020, 03/25/2021, 03/27/2021   Moderna Sars-Covid-2 Vaccination 09/05/2019, 10/03/2019, 06/13/2020, 03/25/2021, 05/14/2021   Pneumococcal Conjugate-13 03/05/2014   Pneumococcal Polysaccharide-23 06/04/2009   Pneumococcal-Unspecified 03/13/2009   Td 03/13/2009, 04/13/2012   Zoster Recombinat (Shingrix) 08/03/2017, 10/01/2017   Zoster, Live 03/21/2010    Past Medical History:  Diagnosis Date   Age-related osteoporosis without current pathological fracture    Alzheimer's disease ([place])    with late onset   Arthritis    Asthma    Deficiency of other  specified B group vitamins    Depression    Environmental allergies    mold   Fall    GERD without esophagitis    Hyperlipidemia    Hypertension    Hypertensive kidney disease with CKD (chronic kidney disease)    with stage I through stage 4 CKD    Memory loss    Seasonal allergies    Unspecified osteoarthritis, unspecified site     Tobacco History: Social History   Tobacco Use  Smoking Status Never  Smokeless Tobacco Never   Counseling given: Not Answered   Continue to not smoke  Outpatient Encounter Medications as of 09/10/2021  Medication Sig   acetaminophen (TYLENOL) 500 MG tablet Take 500 mg by mouth in the morning, at noon, and at bedtime.    budesonide (PULMICORT) 0.5 MG/2ML nebulizer solution Take 0.5 mg by nebulization 2 (two) times daily.   Calcium Carb-Cholecalciferol (CALCIUM 600-D PO) Take 1 tablet by mouth daily.   Cholecalciferol (VITAMIN D) 2000 units tablet Take 2,000 Units by mouth at bedtime.   dextromethorphan (DELSYM) 30 MG/5ML liquid Take 60 mg by mouth every 12 (twelve) hours as needed for cough.   docusate sodium (COLACE) 100 MG capsule Take 100 mg by mouth at bedtime.    dupilumab (DUPIXENT) 300 MG/2ML prefilled syringe Inject 300 mg into the skin every 14 (fourteen) days.   FLUoxetine (PROZAC) 20 MG capsule Take 40 mg by mouth daily.   fluticasone (FLONASE) 50 MCG/ACT nasal spray Place 2 sprays into both nostrils daily. 8 am - 11 am   formoterol (PERFOROMIST) 20 MCG/2ML nebulizer solution Take 20 mcg by nebulization 2 (two) times daily.   furosemide (LASIX) 20 MG tablet Take 20 mg by mouth daily.   hydrOXYzine (ATARAX) 25 MG tablet Take 25 mg by mouth 2 (two) times daily as needed.   ipratropium-albuterol (DUONEB) 0.5-2.5 (3) MG/3ML SOLN Take 3 mLs by nebulization every 6 (six) hours as needed.   lactose free nutrition (BOOST) LIQD Take 237 mLs by mouth daily.   memantine (NAMENDA) 10 MG tablet Take 1 tablet (10 mg total) by mouth 2 (two) times  daily.   montelukast (SINGULAIR) 10 MG tablet Take 1 tablet (10 mg total) by mouth daily.   omeprazole (PRILOSEC) 20 MG capsule Take 20 mg by mouth 2 (two) times daily.   ondansetron (ZOFRAN) 4 MG tablet Take 4 mg by mouth every 8 (eight) hours as needed for nausea or vomiting.   Polyethyl Glycol-Propyl Glycol (SYSTANE) 0.4-0.3 % GEL ophthalmic gel Place 1 application into both eyes at bedtime.   polyethylene glycol (MIRALAX / GLYCOLAX) packet Take 17 g by mouth daily as needed.   triamcinolone cream (KENALOG) 0.1 % Apply 1 application topically as needed.   vitamin B-12 (CYANOCOBALAMIN) 1000 MCG tablet Take 3,000 mcg by mouth daily.   [DISCONTINUED] rivaroxaban (XARELTO) 10 MG TABS tablet Take 1 tablet (10 mg total)  by mouth daily.   No facility-administered encounter medications on file as of 09/10/2021.     Review of Systems  Review of Systems  N/a Physical Exam  BP 122/72 (BP Location: Left Arm, Patient Position: Sitting, Cuff Size: Normal)    Pulse 62    Temp 98.2 F (36.8 C) (Oral)    SpO2 96%   Wt Readings from Last 5 Encounters:  09/08/21 154 lb 3.2 oz (69.9 kg)  08/12/21 152 lb 4.8 oz (69.1 kg)  07/07/21 155 lb 6.4 oz (70.5 kg)  07/01/21 157 lb (71.2 kg)  06/09/21 154 lb 9.6 oz (70.1 kg)    BMI Readings from Last 5 Encounters:  09/08/21 28.20 kg/m  08/12/21 27.86 kg/m  07/07/21 28.42 kg/m  07/01/21 28.72 kg/m  06/09/21 28.28 kg/m     Physical Exam General: Sitting in wheelchair, no acute distress Eyes: EOMI, no icterus Neck: Supple, no JVD appreciated Cardiovascular: Regular rate and rhythm, no murmur Pulmonary: Clear, rhonchi in left base, wheeze on the right that is intermittent likely reflective of mucus impaction MSK: No synovitis, no joint effusion Neuro: In wheelchair, no weakness, sensation intact Psych: Normal mood, full affect   Assessment & Plan:   Acute cough: Productive.  Present for a few days now.  Negative COVID test.  Chest x-ray  ordered for further evaluation.  Formal read not back but on my review interpretation shows left lower lobe infiltrate with obscuration of the left heart border as well as diaphragm, with rhonchi in that area likely reflective of pneumonia, atelectasis possible.  We will contact nursing facility with order for course of antibiotics, Augmentin twice daily x7 days for presumed pneumonia.  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   To continue Dupixent, nebulized ICS/LABA as well as DuoNebs as needed.  Nasal allergies: s/p surgery in past. Continue montelukast and flonase.   Return in about 3 months (around 12/08/2021).   Lanier Clam, MD 09/10/2021

## 2021-09-10 NOTE — Patient Instructions (Addendum)
Nice to see you again  You have some wheezing on exam today, I am not sure if this is related to asthma or the phlegm.  I would like to get a chest x-ray today.  If the chest x-ray is clear, I will call [facility] and recommend a course of prednisone.  If the chest x-ray shows something abnormal, I will call in antibiotics.  Return to clinic in 3 months or sooner as needed with Dr. Silas Flood

## 2021-09-19 ENCOUNTER — Telehealth: Payer: Self-pay | Admitting: Pulmonary Disease

## 2021-09-19 MED ORDER — BENZONATATE 200 MG PO CAPS
200.0000 mg | ORAL_CAPSULE | Freq: Three times a day (TID) | ORAL | 1 refills | Status: AC | PRN
Start: 2021-09-19 — End: ?

## 2021-09-19 NOTE — Telephone Encounter (Signed)
Called and left voicemail for patient to call office back. Wanted to carify orders with her first before placing them

## 2021-09-19 NOTE — Telephone Encounter (Signed)
It is not unusual for ongoing cough after PNA .  May take a while to get better .   Can use Mucinex DM Twice daily  As needed  cough/congestion  Tessalon Three times a day  As needed  cough #30.  Cont on nebs.   Please contact office for sooner follow up if symptoms do not improve or worsen or seek emergency care

## 2021-09-19 NOTE — Telephone Encounter (Signed)
Primary Pulmonologist: [facility] Last office visit and with whom: 09/10/2021 with [facility] What do we see them for (pulmonary problems): asthma Last OV assessment/plan: Acute cough: Productive.  Present for a few days now.  Negative COVID test.  Chest x-ray ordered for further evaluation.  Formal read not back but on my review interpretation shows left lower lobe infiltrate with obscuration of the left heart border as well as diaphragm, with rhonchi in that area likely reflective of pneumonia, atelectasis possible.  We will contact nursing facility with order for course of antibiotics, Augmentin twice daily x7 days for presumed pneumonia.   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   To continue Dupixent, nebulized ICS/LABA as well as DuoNebs as needed.   Nasal allergies: s/p surgery in past. Continue montelukast and flonase.     Return in about 3 months (around 12/08/2021).  Was appointment offered to patient (explain)?  Misty wanted recommendations first.    Reason for call: Called and spoke with [facility]. She stated that Dr. Silas Flood called her last week because he was concerned about her CXR. He gave a verbal order for Augmentin twice daily for a week. She has finished the Augmentin but still has the cough. She has been wheezing more over the past 3 days. The cough is non-productive. Denied any fevers or body aches. She stated that overall the patient felt ok but Rojelio Brenner is concerned considering it is almost the weekend.   She has been doing her scheduled nebulizer treatments of Perforomist and Pulmicort. She also has access to the DuoNebs as needed. Still using her Dupixent every 2 weeks.   (examples of things to ask: : When did symptoms start? Fever? Cough? Productive? Color to sputum? More sputum than usual? Wheezing? Have you needed increased oxygen? Are you taking your respiratory medications? What over the counter measures have you tried?)  Allergies   Allergen Reactions   Azithromycin Other (See Comments)    Nausea, weakness    Aspirin Other (See Comments)    Unknown reaction   Molds & Smuts    Septra [Sulfamethoxazole-Trimethoprim]    Sulfa Antibiotics    Sulfonamide Derivatives Other (See Comments)    Unknown reaction    Immunization History  Administered Date(s) Administered   Influenza Inj Mdck Quad Pf 05/18/2019, 05/07/2021   Influenza Split 04/04/2012, 05/03/2013, 05/10/2015   Influenza Whole 04/18/2008, 06/04/2009, 04/03/2010, 03/30/2011   Influenza, High Dose Seasonal PF 04/20/2018, 05/24/2020   Influenza,inj,Quad PF,6+ Mos 03/26/2017   Influenza-Unspecified 03/26/2010, 04/13/2012, 05/08/2013, 05/14/2014, 06/03/2014, 05/13/2015, 04/27/2016, 04/29/2016   Moderna SARS-COV2 Booster Vaccination 06/13/2020, 03/25/2021, 03/27/2021   Moderna Sars-Covid-2 Vaccination 09/05/2019, 10/03/2019, 06/13/2020, 03/25/2021, 05/14/2021   Pneumococcal Conjugate-13 03/05/2014   Pneumococcal Polysaccharide-23 06/04/2009   Pneumococcal-Unspecified 03/13/2009   Td 03/13/2009, 04/13/2012   Zoster Recombinat (Shingrix) 08/03/2017, 10/01/2017   Zoster, Live 03/21/2010    Misty wanted to know if there anything else that could be called in for the cough. She has requested that if a medication has been prescribed to call her back directly and provide a verbal vs sending it to a pharmacy.   TP, can you please advise since MH is not available today? Thanks!

## 2021-09-23 ENCOUNTER — Telehealth: Payer: Self-pay | Admitting: Adult Health

## 2021-09-25 NOTE — Telephone Encounter (Signed)
Nursing home would like RX for Liquid mucinex as patient cant not swallow large tablets.   This order will need to be written and signed by Rexene Edison and faxed to [facility].   Fax : [phone]

## 2021-09-26 ENCOUNTER — Encounter: Payer: Self-pay | Admitting: Adult Health

## 2021-09-26 ENCOUNTER — Non-Acute Institutional Stay (INDEPENDENT_AMBULATORY_CARE_PROVIDER_SITE_OTHER): Payer: Medicare Other | Admitting: Adult Health

## 2021-09-26 DIAGNOSIS — Z0000 Encounter for general adult medical examination without abnormal findings: Secondary | ICD-10-CM | POA: Diagnosis not present

## 2021-09-26 MED ORDER — GUAIFENESIN 100 MG/5ML PO LIQD: 5.0000 mL | ORAL | 3 refills | Status: DC | PRN

## 2021-09-26 NOTE — Telephone Encounter (Signed)
Okay that is fine

## 2021-09-26 NOTE — Patient Instructions (Signed)
[name] , Thank you for taking time to come for your Medicare Wellness Visit. I appreciate your ongoing commitment to your health goals. Please review the following plan we discussed and let me know if I can assist you in the future.   Screening recommendations/referrals: Colonoscopy aged out Mammogram aged out Bone Density non ambulatory with dementia in skilled care Recommended yearly ophthalmology/optometry visit for glaucoma screening and checkup Recommended yearly dental visit for hygiene and checkup  Vaccinations: Influenza vaccine up to date Pneumococcal vaccine up to date Tdap vaccine up to date Shingles vaccine up to date    Advanced directives: reviewed  Conditions/risks identified: cardiac   Next appointment: 1 year   Preventive Care 27 Years and Older, Female Preventive care refers to lifestyle choices and visits with your health care provider that can promote health and wellness. What does preventive care include? A yearly physical exam. This is also called an annual well check. Dental exams once or twice a year. Routine eye exams. Ask your health care provider how often you should have your eyes checked. Personal lifestyle choices, including: Daily care of your teeth and gums. Regular physical activity. Eating a healthy diet. Avoiding tobacco and drug use. Limiting alcohol use. Practicing safe sex. Taking low-dose aspirin every day. Taking vitamin and mineral supplements as recommended by your health care provider. What happens during an annual well check? The services and screenings done by your health care provider during your annual well check will depend on your age, overall health, lifestyle risk factors, and family history of disease. Counseling  Your health care provider may ask you questions about your: Alcohol use. Tobacco use. Drug use. Emotional well-being. Home and relationship well-being. Sexual activity. Eating habits. History of  falls. Memory and ability to understand (cognition). Work and work Statistician. Reproductive health. Screening  You may have the following tests or measurements: Height, weight, and BMI. Blood pressure. Lipid and cholesterol levels. These may be checked every 5 years, or more frequently if you are over 53 years old. Skin check. Lung cancer screening. You may have this screening every year starting at age 73 if you have a 30-pack-year history of smoking and currently smoke or have quit within the past 15 years. Fecal occult blood test (FOBT) of the stool. You may have this test every year starting at age 34. Flexible sigmoidoscopy or colonoscopy. You may have a sigmoidoscopy every 5 years or a colonoscopy every 10 years starting at age 45. Hepatitis C blood test. Hepatitis B blood test. Sexually transmitted disease (STD) testing. Diabetes screening. This is done by checking your blood sugar (glucose) after you have not eaten for a while (fasting). You may have this done every 1-3 years. Bone density scan. This is done to screen for osteoporosis. You may have this done starting at age 34. Mammogram. This may be done every 1-2 years. Talk to your health care provider about how often you should have regular mammograms. Talk with your health care provider about your test results, treatment options, and if necessary, the need for more tests. Vaccines  Your health care provider may recommend certain vaccines, such as: Influenza vaccine. This is recommended every year. Tetanus, diphtheria, and acellular pertussis (Tdap, Td) vaccine. You may need a Td booster every 10 years. Zoster vaccine. You may need this after age 67. Pneumococcal 13-valent conjugate (PCV13) vaccine. One dose is recommended after age 71. Pneumococcal polysaccharide (PPSV23) vaccine. One dose is recommended after age 78. Talk to your health care  provider about which screenings and vaccines you need and how often you need  them. This information is not intended to replace advice given to you by your health care provider. Make sure you discuss any questions you have with your health care provider. Document Released: 08/16/2015 Document Revised: 04/08/2016 Document Reviewed: 05/21/2015 Elsevier Interactive Patient Education  2017 [place] Prevention in the Home Falls can cause injuries. They can happen to people of all ages. There are many things you can do to make your home safe and to help prevent falls. What can I do on the outside of my home? Regularly fix the edges of walkways and driveways and fix any cracks. Remove anything that might make you trip as you walk through a door, such as a raised step or threshold. Trim any bushes or trees on the path to your home. Use bright outdoor lighting. Clear any walking paths of anything that might make someone trip, such as rocks or tools. Regularly check to see if handrails are loose or broken. Make sure that both sides of any steps have handrails. Any raised decks and porches should have guardrails on the edges. Have any leaves, snow, or ice cleared regularly. Use sand or salt on walking paths during winter. Clean up any spills in your garage right away. This includes oil or grease spills. What can I do in the bathroom? Use night lights. Install grab bars by the toilet and in the tub and shower. Do not use towel bars as grab bars. Use non-skid mats or decals in the tub or shower. If you need to sit down in the shower, use a plastic, non-slip stool. Keep the floor dry. Clean up any water that spills on the floor as soon as it happens. Remove soap buildup in the tub or shower regularly. Attach bath mats securely with double-sided non-slip rug tape. Do not have throw rugs and other things on the floor that can make you trip. What can I do in the bedroom? Use night lights. Make sure that you have a light by your bed that is easy to reach. Do not use  any sheets or blankets that are too big for your bed. They should not hang down onto the floor. Have a firm chair that has side arms. You can use this for support while you get dressed. Do not have throw rugs and other things on the floor that can make you trip. What can I do in the kitchen? Clean up any spills right away. Avoid walking on wet floors. Keep items that you use a lot in easy-to-reach places. If you need to reach something above you, use a strong step stool that has a grab bar. Keep electrical cords out of the way. Do not use floor polish or wax that makes floors slippery. If you must use wax, use non-skid floor wax. Do not have throw rugs and other things on the floor that can make you trip. What can I do with my stairs? Do not leave any items on the stairs. Make sure that there are handrails on both sides of the stairs and use them. Fix handrails that are broken or loose. Make sure that handrails are as long as the stairways. Check any carpeting to make sure that it is firmly attached to the stairs. Fix any carpet that is loose or worn. Avoid having throw rugs at the top or bottom of the stairs. If you do have throw rugs, attach them to the  floor with carpet tape. Make sure that you have a light switch at the top of the stairs and the bottom of the stairs. If you do not have them, ask someone to add them for you. What else can I do to help prevent falls? Wear shoes that: Do not have high heels. Have rubber bottoms. Are comfortable and fit you well. Are closed at the toe. Do not wear sandals. If you use a stepladder: Make sure that it is fully opened. Do not climb a closed stepladder. Make sure that both sides of the stepladder are locked into place. Ask someone to hold it for you, if possible. Clearly mark and make sure that you can see: Any grab bars or handrails. First and last steps. Where the edge of each step is. Use tools that help you move around (mobility aids)  if they are needed. These include: Canes. Walkers. Scooters. Crutches. Turn on the lights when you go into a dark area. Replace any light bulbs as soon as they burn out. Set up your furniture so you have a clear path. Avoid moving your furniture around. If any of your floors are uneven, fix them. If there are any pets around you, be aware of where they are. Review your medicines with your doctor. Some medicines can make you feel dizzy. This can increase your chance of falling. Ask your doctor what other things that you can do to help prevent falls. This information is not intended to replace advice given to you by your health care provider. Make sure you discuss any questions you have with your health care provider. Document Released: 05/16/2009 Document Revised: 12/26/2015 Document Reviewed: 08/24/2014 Elsevier Interactive Patient Education  2017 [employer].

## 2021-09-26 NOTE — Telephone Encounter (Signed)
Liquid mucinex sent to [facility].  Cody with [facility] is aware and voiced her understanding. Nothing further needed.

## 2021-09-26 NOTE — Progress Notes (Signed)
Subjective:   [name] is a 88 y.o. female who presents for Medicare Annual (Subsequent) preventive examination at [facility]  Review of Systems           Objective:    Today's Vitals   09/26/21 1022  BP: 133/72  Pulse: 68  Resp: 20  Temp: (!) 97.4 F (36.3 C)  SpO2: 96%  Weight: 154 lb 3.2 oz (69.9 kg)  Height: 5' 2" (1.575 m)   Body mass index is 28.2 kg/m.  Advanced Directives 09/26/2021 09/08/2021 08/12/2021 07/07/2021 06/09/2021 04/17/2021 03/18/2021  Does Patient Have a Medical Advance Directive? _0  Yes Yes  Type of Paramedic of [place];Living will;Out of facility DNR (pink MOST or yellow form) [place];Living will;Out of facility DNR (pink MOST or yellow form) [place];Living will;Out of facility DNR (pink MOST or yellow form) [place];Living will;Out of facility DNR (pink MOST or yellow form) [place];Living will;Out of facility DNR (pink MOST or yellow form) [place];Living will;Out of facility DNR (pink MOST or yellow form) [place];Living will;Out of facility DNR (pink MOST or yellow form)  Does patient want to make changes to medical advance directive? No - Patient declined No - Patient declined No - Patient declined No - Patient declined No - Patient declined No - Patient declined No - Patient declined  Copy of [place] in Chart? Yes - validated most recent copy scanned in chart (See row information) Yes - validated most recent copy scanned in chart (See row information) Yes - validated most recent copy scanned in chart (See row information) Yes - validated most recent copy scanned in chart (See row information) Yes - validated most recent copy scanned in chart (See row information) Yes - validated most recent copy scanned in chart (See row information) Yes - validated most recent copy scanned  in chart (See row information)  Pre-existing out of facility DNR order (yellow form or pink MOST form) Yellow form placed in chart (order not valid for inpatient use) Yellow form placed in chart (order not valid for inpatient use) Yellow form placed in chart (order not valid for inpatient use) Yellow form placed in chart (order not valid for inpatient use) Yellow form placed in chart (order not valid for inpatient use) Yellow form placed in chart (order not valid for inpatient use) Yellow form placed in chart (order not valid for inpatient use)    Current Medications (verified) Outpatient Encounter Medications as of 09/26/2021  Medication Sig   acetaminophen (TYLENOL) 500 MG tablet Take 500 mg by mouth in the morning, at noon, and at bedtime.    benzonatate (TESSALON) 200 MG capsule Take 1 capsule (200 mg total) by mouth 3 (three) times daily as needed for cough.   budesonide (PULMICORT) 0.5 MG/2ML nebulizer solution Take 0.5 mg by nebulization 2 (two) times daily.   Calcium Carb-Cholecalciferol (CALCIUM 600-D PO) Take 1 tablet by mouth daily.   Cholecalciferol (VITAMIN D) 2000 units tablet Take 2,000 Units by mouth at bedtime.   dextromethorphan (DELSYM) 30 MG/5ML liquid Take 60 mg by mouth every 12 (twelve) hours as needed for cough.   docusate sodium (COLACE) 100 MG capsule Take 100 mg by mouth at bedtime.    dupilumab (DUPIXENT) 300 MG/2ML prefilled syringe Inject 300 mg into the skin every 14 (fourteen) days.   FLUoxetine (PROZAC) 20 MG capsule Take 40 mg  by mouth daily.   fluticasone (FLONASE) 50 MCG/ACT nasal spray Place 2 sprays into both nostrils daily. 8 am - 11 am   formoterol (PERFOROMIST) 20 MCG/2ML nebulizer solution Take 20 mcg by nebulization 2 (two) times daily.   furosemide (LASIX) 20 MG tablet Take 20 mg by mouth daily.   hydrOXYzine (ATARAX) 25 MG tablet Take 25 mg by mouth 2 (two) times daily as needed.   ipratropium-albuterol (DUONEB) 0.5-2.5 (3) MG/3ML SOLN Take 3 mLs by  nebulization every 6 (six) hours as needed.   lactose free nutrition (BOOST) LIQD Take 237 mLs by mouth daily.   memantine (NAMENDA) 10 MG tablet Take 1 tablet (10 mg total) by mouth 2 (two) times daily.   montelukast (SINGULAIR) 10 MG tablet Take 1 tablet (10 mg total) by mouth daily.   omeprazole (PRILOSEC) 20 MG capsule Take 20 mg by mouth 2 (two) times daily.   ondansetron (ZOFRAN) 4 MG tablet Take 4 mg by mouth every 8 (eight) hours as needed for nausea or vomiting.   Polyethyl Glycol-Propyl Glycol (SYSTANE) 0.4-0.3 % GEL ophthalmic gel Place 1 application into both eyes at bedtime.   polyethylene glycol (MIRALAX / GLYCOLAX) packet Take 17 g by mouth daily as needed.   triamcinolone cream (KENALOG) 0.1 % Apply 1 application topically as needed.   vitamin B-12 (CYANOCOBALAMIN) 1000 MCG tablet Take 3,000 mcg by mouth daily.   [DISCONTINUED] rivaroxaban (XARELTO) 10 MG TABS tablet Take 1 tablet (10 mg total) by mouth daily.   No facility-administered encounter medications on file as of 09/26/2021.    Allergies (verified) Azithromycin, Aspirin, Molds & smuts, Septra [sulfamethoxazole-trimethoprim], Sulfa antibiotics, and Sulfonamide derivatives   History: Past Medical History:  Diagnosis Date   Age-related osteoporosis without current pathological fracture    Alzheimer's disease ([place])    with late onset   Arthritis    Asthma    Deficiency of other specified B group vitamins    Depression    Environmental allergies    mold   Fall    GERD without esophagitis    Hyperlipidemia    Hypertension    Hypertensive kidney disease with CKD (chronic kidney disease)    with stage I through stage 4 CKD    Memory loss    Seasonal allergies    Unspecified osteoarthritis, unspecified site    Past Surgical History:  Procedure Laterality Date   HIP ARTHROPLASTY  06/25/2011   Procedure: ARTHROPLASTY BIPOLAR HIP;  Surgeon: Laurice Record [place];  Location: [facility];  Service: Orthopedics;   Laterality: Right;   NASAL SINUS SURGERY     VAGINAL HYSTERECTOMY     Family History  Problem Relation Age of Onset   Asthma Other    Colon cancer Father    Dementia Neg Hx    Social History   Socioeconomic History   Marital status: Married    Spouse name: Not on file   Number of children: 1   Years of education: 16   Highest education level: Not on file  Occupational History   Occupation: retired    Fish farm manager: RETIRED  Tobacco Use   Smoking status: Never   Smokeless tobacco: Never  Vaping Use   Vaping Use: Never used  Substance and Sexual Activity   Alcohol use: Yes    Alcohol/week: 2.0 standard drinks    Types: 2 Glasses of wine per week    Comment: 2 glasses daily   Drug use: No   Sexual activity: Not on file  Other  Topics Concern   Not on file  Social History Narrative   Originally from [place]. She previously lived in [place]. 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 No bird exposure. No recent mold exposure. Previous hold did have mold.       Lives at [place]: skilled nursing area    Caffeine use: 1 cup coffee /day   Social Determinants of Health   Financial Resource Strain: Not on file  Food Insecurity: Not on file  Transportation Needs: Not on file  Physical Activity: Not on file  Stress: Not on file  Social Connections: Not on file    Tobacco Counseling Counseling given: Not Answered   Clinical Intake:  Pre-visit preparation completed: No  Pain : No/denies pain     BMI - recorded: 28.2 Nutritional Status: BMI 25 -29 Overweight Nutritional Risks: None Diabetes: No  How often do you need to have someone help you when you read instructions, pamphlets, or other written materials from your doctor or pharmacy?: 5 - Always  Diabetic?no  Interpreter Needed?: No  Information entered by :: Margreta Journey  NP   Activities of Daily Living In your present state of health, do you have any difficulty performing the following  activities: 09/26/2021  Hearing? Y  Vision? Y  Difficulty concentrating or making decisions? Y  Walking or climbing stairs? Y  Dressing or bathing? Y  Doing errands, shopping? Y  Some recent data might be hidden    Patient Care Team: Virgie Dad, MD as PCP - General (Internal Medicine) Elsie Stain, MD (Pulmonary Disease)  Indicate any recent Medical Services you may have received from other than [facility] providers in the past year (date may be approximate).     Assessment:   This is a routine wellness examination for [place].  Hearing/Vision screen No results found.  Dietary issues and exercise activities discussed:     Goals Addressed             This Visit's Progress    Social and Functional Skills Optimized       Evidence-based guidance:  Assess level of social support; promote maintaining links with family, friends and community to reduce social isolation.  Assess level of function related to basic activities of daily living that include eating, dressing, bathing, as well as instrumental activities of daily living such as shopping, managing finances and use of devices.  Encourage continuation of daily life components such as self-care, home maintenance, financial management, volunteer activities, education opportunities and hobbies.  Refer to occupational or physical therapy to develop comprehensive rehabilitation plan to improve or maintain activities of daily living; consider inclusion of endurance, balance and resistance-training.  Consider complementary therapy such as yoga, music, gardening, outdoor activities, aromatherapy and tai chi.   Notes:        Depression Screen PHQ 2/9 Scores 09/26/2021  Exception Documentation Other- indicate reason in comment box  Not completed pt confused    Fall Risk Fall Risk  09/26/2021 01/11/2020 12/07/2012  Falls in the past year? 0 0 Yes  Number falls in past yr: 0 0 2 or more  Injury with Fall? 0 0 -  Risk Factor  Category  - - High Fall Risk  Risk for fall due to : Impaired balance/gait - Impaired balance/gait  Follow up Falls evaluation completed - -    FALL RISK PREVENTION PERTAINING TO THE HOME:  Any stairs in or around the home? No  If so, are there any without handrails? No  Home free of loose throw rugs in walkways, pet beds, electrical cords, etc? Yes  Adequate lighting in your home to reduce risk of falls? Yes   ASSISTIVE DEVICES UTILIZED TO PREVENT FALLS:  Life alert? No  Use of a cane, walker or w/c? Yes  Grab bars in the bathroom? Yes  Shower chair or bench in shower? Yes  Elevated toilet seat or a handicapped toilet? Yes   TIMED UP AND GO:  Was the test performed? No .  Length of time to ambulate 10 feet: not ambulatory  sec.   Gait unsteady with use of assistive device, provider informed and education provided.   Cognitive Function: MMSE - Mini Mental State Exam 09/26/2021 09/20/2018 09/14/2017  Not completed: (No Data) - -  Orientation to time 2 1 0  Orientation to Place _0 Registration _1 Attention/ Calculation 0 0 5  Recall 0 0 2  Language- name 2 objects _2 Language- repeat _3 Language- follow 3 step command 0 3 3  Language- read & follow direction _4 Write a sentence 0 1 1  Copy design 0 0 0  Total score _5 Montreal Cognitive Assessment  09/08/2016 03/04/2016 09/04/2015  Visuospatial/ Executive (0/5) _6 Naming (0/3) _7 Attention: Read list of digits (0/2) _8 Attention: Read list of letters (0/1) _9 Attention: Serial 7 subtraction starting at 100 (0/3) _10 Language: Repeat phrase (0/2) _11 Language : Fluency (0/1) _12 Abstraction (0/2) _13 Delayed Recall (0/5) 0 0 0  Orientation (0/6) _14 Total _15 Adjusted Score (based on education) _16 Immunizations Immunization History  Administered Date(s) Administered   Influenza Inj Mdck Quad Pf 05/18/2019, 05/07/2021   Influenza Split  04/04/2012, 05/03/2013, 05/10/2015   Influenza Whole 04/18/2008, 06/04/2009, 04/03/2010, 03/30/2011   Influenza, High Dose Seasonal PF 04/20/2018, 05/24/2020   Influenza,inj,Quad PF,6+ Mos 03/26/2017   Influenza-Unspecified 03/26/2010, 04/13/2012, 05/08/2013, 05/14/2014, 06/03/2014, 05/13/2015, 04/27/2016, 04/29/2016   Moderna SARS-COV2 Booster Vaccination 06/13/2020, 03/25/2021, 03/27/2021   Moderna Sars-Covid-2 Vaccination 09/05/2019, 10/03/2019, 06/13/2020, 03/25/2021, 05/14/2021   Pneumococcal Conjugate-13 03/05/2014   Pneumococcal Polysaccharide-23 06/04/2009   Pneumococcal-Unspecified 03/13/2009   Td 03/13/2009, 04/13/2012   Zoster Recombinat (Shingrix) 08/03/2017, 10/01/2017   Zoster, Live 03/21/2010    TDAP status: Up to date  Flu Vaccine status: Up to date  Pneumococcal vaccine status: Up to date  Covid-19 vaccine status: Completed vaccines  Qualifies for Shingles Vaccine? Yes   Zostavax completed Yes   Shingrix Completed?: Yes  Screening Tests Health Maintenance  Topic Date Due   COVID-19 Vaccine (4 - Booster for Moderna series) 12/31/2021 (Originally 07/09/2021)   TETANUS/TDAP  04/13/2022   Pneumonia Vaccine 12+ Years old  Completed   INFLUENZA VACCINE  Completed   Zoster Vaccines- Shingrix  Completed   HPV VACCINES  Aged Out   DEXA SCAN  Discontinued    Health Maintenance  There are no preventive care reminders to display for this patient.  Colorectal cancer screening: No longer required.   Mammogram status: No longer required due to aged.  Bone Density status: Completed na. Results reflect: Bone density results: NORMAL. Repeat every na years.Not ordered due to age, dementia, and non ambulatory status  Lung Cancer Screening: (Low Dose CT Chest  recommended if Age 34-80 years, 75 pack-year currently smoking OR have quit w/in 15years.) does not qualify.   Lung Cancer Screening Referral: na  Additional Screening:  Hepatitis C Screening: does not qualify;  Completed na  Vision Screening: Recommended annual ophthalmology exams for early detection of glaucoma and other disorders of the eye. Is the patient up to date with their annual eye exam?  Yes  Who is the provider or what is the name of the office in which the patient attends annual eye exams?  If pt is not established with a provider, would they like to be referred to a provider to establish care? No .   Dental Screening: Recommended annual dental exams for proper oral hygiene  Community Resource Referral / Chronic Care Management: CRR required this visit?  No   CCM required this visit?  No      Plan:     I have personally reviewed and noted the following in the patients chart:   Medical and social history Use of alcohol, tobacco or illicit drugs  Current medications and supplements including opioid prescriptions.  Functional ability and status Nutritional status Physical activity Advanced directives List of other physicians Hospitalizations, surgeries, and ER visits in previous 12 months Vitals Screenings to include cognitive, depression, and falls Referrals and appointments  In addition, I have reviewed and discussed with patient certain preventive protocols, quality metrics, and best practice recommendations. A written personalized care plan for preventive services as well as general preventive health recommendations were provided to patient.     Royal Hawthorn, NP   09/26/2021   Nurse Notes:

## 2021-10-06 ENCOUNTER — Non-Acute Institutional Stay (SKILLED_NURSING_FACILITY): Payer: Medicare Other | Admitting: Adult Health

## 2021-10-06 DIAGNOSIS — K219 Gastro-esophageal reflux disease without esophagitis: Secondary | ICD-10-CM | POA: Diagnosis not present

## 2021-10-06 DIAGNOSIS — G301 Alzheimer's disease with late onset: Secondary | ICD-10-CM

## 2021-10-06 DIAGNOSIS — J4550 Severe persistent asthma, uncomplicated: Secondary | ICD-10-CM | POA: Diagnosis not present

## 2021-10-06 DIAGNOSIS — F325 Major depressive disorder, single episode, in full remission: Secondary | ICD-10-CM | POA: Diagnosis not present

## 2021-10-06 DIAGNOSIS — F02818 Dementia in other diseases classified elsewhere, unspecified severity, with other behavioral disturbance: Secondary | ICD-10-CM

## 2021-10-06 DIAGNOSIS — N1832 Chronic kidney disease, stage 3b: Secondary | ICD-10-CM | POA: Diagnosis not present

## 2021-10-06 DIAGNOSIS — R051 Acute cough: Secondary | ICD-10-CM | POA: Diagnosis not present

## 2021-10-06 DIAGNOSIS — E559 Vitamin D deficiency, unspecified: Secondary | ICD-10-CM

## 2021-10-07 ENCOUNTER — Encounter: Payer: Self-pay | Admitting: Adult Health

## 2021-10-07 NOTE — Progress Notes (Signed)
Location:   [place] [facility]:   SNF Provider: Royal Hawthorn, NP  Virgie Dad, MD  Patient Care Team: Virgie Dad, MD as PCP - General (Internal Medicine) Elsie Stain, MD (Pulmonary Disease)  Extended Emergency Contact Information Primary Emergency Contact: Earlene Plater of [place] Phone: [phone] Mobile Phone: [phone] Relation: Daughter  Code Status:  DNR Goals of care: Advanced Directive information Advanced Directives 09/26/2021  Does Patient Have a Medical Advance Directive? Yes  Type of Paramedic of [place];Living will;Out of facility DNR (pink MOST or yellow form)  Does patient want to make changes to medical advance directive? No - Patient declined  Copy of [place] in Chart? Yes - validated most recent copy scanned in chart (See row information)  Pre-existing out of facility DNR order (yellow form or pink MOST form) Yellow form placed in chart (order not valid for inpatient use)     Chief Complaint  Patient presents with   Medical Management of Chronic Issues    HPI:  Pt is a 88 y.o. female seen today for medical management of chronic diseases.   PMH significant for asthma, OP, arthritis, Vit d deficiency, depression, dementia, HTN, HLD, hyperlipidemia, seasonal allergies, OA, and GERD.   BPs reviewed.  Blood Pressure: 133 / 72 mmHg  Blood Pressure: 137 / 62 mmHg   Wt Readings from Last 3 Encounters:  10/07/21 157 lb 3.2 oz (71.3 kg)  09/26/21 154 lb 3.2 oz (69.9 kg)  09/08/21 154 lb 3.2 oz (69.9 kg)    Severe asthma with eosinophilia: no issues with cough or wheeze at this time She was treated for a LL by Dr. Silas Flood 09/10/21 with Augmentin. Later she was treated with a prednisone taper. She still has a lingering congested cough but it is much improved. No fever, minimal sputum production. No sob or wheezing. Care giver does  report some coughing with meals. Nurse on the hall has not seen this. Continues on a regular diet with cut up meats.  Past Medical History:  Diagnosis Date   Age-related osteoporosis without current pathological fracture    Alzheimer's disease ([place])    with late onset   Arthritis    Asthma    Deficiency of other specified B group vitamins    Depression    Environmental allergies    mold   Fall    GERD without esophagitis    Hyperlipidemia    Hypertension    Hypertensive kidney disease with CKD (chronic kidney disease)    with stage I through stage 4 CKD    Memory loss    Seasonal allergies    Unspecified osteoarthritis, unspecified site    Past Surgical History:  Procedure Laterality Date   HIP ARTHROPLASTY  06/25/2011   Procedure: ARTHROPLASTY BIPOLAR HIP;  Surgeon: Laurice Record [place];  Location: [facility];  Service: Orthopedics;  Laterality: Right;   NASAL SINUS SURGERY     VAGINAL HYSTERECTOMY      Allergies  Allergen Reactions   Azithromycin Other (See Comments)    Nausea, weakness    Aspirin Other (See Comments)    Unknown reaction   Molds & Smuts    Septra [Sulfamethoxazole-Trimethoprim]    Sulfa Antibiotics    Sulfonamide Derivatives Other (See Comments)    Unknown reaction    Allergies as of 10/06/2021       Reactions   Azithromycin Other (See Comments)   Nausea, weakness  Aspirin Other (See Comments)   Unknown reaction   Molds & Smuts    Septra [sulfamethoxazole-trimethoprim]    Sulfa Antibiotics    Sulfonamide Derivatives Other (See Comments)   Unknown reaction        Medication List        Accurate as of October 06, 2021 11:59 PM. If you have any questions, ask your nurse or doctor.          acetaminophen 500 MG tablet Commonly known as: TYLENOL Take 500 mg by mouth in the morning, at noon, and at bedtime.   benzonatate 200 MG capsule Commonly known as: TESSALON Take 1 capsule (200 mg total) by mouth 3 (three) times daily as needed for  cough.   budesonide 0.5 MG/2ML nebulizer solution Commonly known as: PULMICORT Take 0.5 mg by nebulization 2 (two) times daily.   CALCIUM 600-D PO Take 1 tablet by mouth daily.   dextromethorphan 30 MG/5ML liquid Commonly known as: DELSYM Take 60 mg by mouth every 12 (twelve) hours as needed for cough.   docusate sodium 100 MG capsule Commonly known as: COLACE Take 100 mg by mouth at bedtime.   Dupixent 300 MG/2ML prefilled syringe Generic drug: dupilumab Inject 300 mg into the skin every 14 (fourteen) days.   FLUoxetine 20 MG capsule Commonly known as: PROZAC Take 40 mg by mouth daily.   fluticasone 50 MCG/ACT nasal spray Commonly known as: FLONASE Place 2 sprays into both nostrils daily. 8 am - 11 am   furosemide 20 MG tablet Commonly known as: LASIX Take 20 mg by mouth daily.   guaiFENesin 100 MG/5ML liquid Commonly known as: Mucinex Fast-Max Chest Cong MS Take 5 mLs by mouth every 4 (four) hours as needed for cough or to loosen phlegm.   hydrOXYzine 25 MG tablet Commonly known as: ATARAX Take 25 mg by mouth 2 (two) times daily as needed.   ipratropium-albuterol 0.5-2.5 (3) MG/3ML Soln Commonly known as: DUONEB Take 3 mLs by nebulization every 6 (six) hours as needed.   lactose free nutrition Liqd Take 237 mLs by mouth daily.   memantine 10 MG tablet Commonly known as: Namenda Take 1 tablet (10 mg total) by mouth 2 (two) times daily.   montelukast 10 MG tablet Commonly known as: SINGULAIR Take 1 tablet (10 mg total) by mouth daily.   omeprazole 20 MG capsule Commonly known as: PRILOSEC Take 20 mg by mouth 2 (two) times daily.   ondansetron 4 MG tablet Commonly known as: ZOFRAN Take 4 mg by mouth every 8 (eight) hours as needed for nausea or vomiting.   Perforomist 20 MCG/2ML nebulizer solution Generic drug: formoterol Take 20 mcg by nebulization 2 (two) times daily.   polyethylene glycol 17 g packet Commonly known as: MIRALAX / GLYCOLAX Take 17  g by mouth daily as needed.   Systane 0.4-0.3 % Gel ophthalmic gel Generic drug: Polyethyl Glycol-Propyl Glycol Place 1 application into both eyes at bedtime.   triamcinolone cream 0.1 % Commonly known as: KENALOG Apply 1 application topically as needed.   vitamin B-12 1000 MCG tablet Commonly known as: CYANOCOBALAMIN Take 3,000 mcg by mouth daily.   Vitamin D 50 MCG (2000 UT) tablet Take 2,000 Units by mouth at bedtime.        Review of Systems  Unable to perform ROS: Dementia   Immunization History  Administered Date(s) Administered   Influenza Inj Mdck Quad Pf 05/18/2019, 05/07/2021   Influenza Split 04/04/2012, 05/03/2013, 05/10/2015   Influenza Whole 04/18/2008,  06/04/2009, 04/03/2010, 03/30/2011   Influenza, High Dose Seasonal PF 04/20/2018, 05/24/2020   Influenza,inj,Quad PF,6+ Mos 03/26/2017   Influenza-Unspecified 03/26/2010, 04/13/2012, 05/08/2013, 05/14/2014, 06/03/2014, 05/13/2015, 04/27/2016, 04/29/2016   Moderna SARS-COV2 Booster Vaccination 06/13/2020, 03/25/2021, 03/27/2021   Moderna Sars-Covid-2 Vaccination 09/05/2019, 10/03/2019, 06/13/2020, 03/25/2021, 05/14/2021   Pneumococcal Conjugate-13 03/05/2014   Pneumococcal Polysaccharide-23 06/04/2009   Pneumococcal-Unspecified 03/13/2009   Td 03/13/2009, 04/13/2012   Zoster Recombinat (Shingrix) 08/03/2017, 10/01/2017   Zoster, Live 03/21/2010   Pertinent  Health Maintenance Due  Topic Date Due   INFLUENZA VACCINE  Completed   DEXA SCAN  Discontinued   Fall Risk 12/07/2012 01/11/2020 09/26/2021  Falls in the past year? Yes 0 0  Was there an injury with Fall? - 0 0  Fall Risk Category Calculator - 0 0  Fall Risk Category - Low Low  Patient Fall Risk Level - Low fall risk Low fall risk  Patient at Risk for Falls Due to Impaired balance/gait - Impaired balance/gait  Fall risk Follow up - - Falls evaluation completed   Functional Status Survey:    Vitals:   10/07/21 0750  Weight: 157 lb 3.2 oz (71.3  kg)   Body mass index is 28.75 kg/m. Physical Exam Vitals and nursing note reviewed.  Constitutional:      General: She is not in acute distress.    Appearance: She is not diaphoretic.     Comments: Asleep but arouses easily  HENT:     Head: Normocephalic and atraumatic.     Mouth/Throat:     Mouth: Mucous membranes are moist.     Pharynx: Oropharynx is clear.  Neck:     Vascular: No JVD.  Cardiovascular:     Rate and Rhythm: Normal rate and regular rhythm.     Heart sounds: No murmur heard. Pulmonary:     Effort: Pulmonary effort is normal. No respiratory distress.     Breath sounds: Normal breath sounds. No wheezing.  Abdominal:     General: Bowel sounds are normal. There is no distension.     Palpations: Abdomen is soft.     Tenderness: There is no abdominal tenderness.  Musculoskeletal:     Right lower leg: No edema.     Left lower leg: No edema.  Skin:    General: Skin is warm and dry.  Neurological:     General: No focal deficit present.     Mental Status: Mental status is at baseline.  Psychiatric:        Mood and Affect: Mood normal.    Labs reviewed: Recent Labs    11/22/20 0000 07/14/21 0000  NA 138 144  K 3.9 4.2  CL 104 105  CO2 25* 24*  BUN 25* 31*  CREATININE 1.0 1.0  CALCIUM 9.2 9.5   No results for input(s): AST, ALT, ALKPHOS, BILITOT, PROT, ALBUMIN in the last 8760 hours. Recent Labs    07/14/21 0000  WBC 6.8  HGB 11.2*  HCT 35*  PLT 236   Lab Results  Component Value Date   TSH 1.52 09/23/2020   No results found for: HGBA1C No results found for: CHOL, HDL, LDLCALC, LDLDIRECT, TRIG, CHOLHDL  Significant Diagnostic Results in last 30 days:  DG Chest 1 View  Result Date: 09/11/2021 CLINICAL DATA:  Cough. EXAM: CHEST  1 VIEW COMPARISON:  Chest radiograph dated 02/02/2018. FINDINGS: Chronic interstitial coarsening and bronchitic changes. Left lung base atelectasis/scarring. Pneumonia is not excluded. Clinical correlation is  recommended. No pleural effusion pneumothorax. Stable cardiac  silhouette. Atherosclerotic calcification of the aorta. Degenerative changes of the spine and shoulders. No acute osseous pathology. IMPRESSION: Left lung base atelectasis/scarring. Pneumonia is not excluded. Electronically Signed   By: Anner Crete M.D.   On: 09/11/2021 13:14    Assessment/Plan  1. Acute cough Improving but still present Continue tessalon and mucinex dm Asked nurse to monitor meal for signs of aspiration, if noted will obtain ST consult.   2. Severe persistent asthma without complication Followed by pulmonary on complex regimen including dupixent  3. Gastro-esophageal reflux disease without esophagitis Continue Prilosec bid, would not taper  4. Dementia of the Alzheimer's type, with late onset, with delusions ([place]) Progressive decline in cognition and physical function c/w the disease. Continue supportive care in the skilled environment. Continue namenda.   5. Stage 3b chronic kidney disease ([place]) Continue to periodically monitor BMP and avoid nephrotoxic agents  6. Major depressive disorder, single episode, in remission ([place]) Continues on prozac, no signs of depression. Does have periods of anxiety in the evening, would not taper  7. Vitamin D deficiency Continue Vit D 2000 units qd   Family/ staff Communication: nurse  Labs/tests ordered:   na

## 2021-11-14 ENCOUNTER — Non-Acute Institutional Stay (SKILLED_NURSING_FACILITY): Payer: Medicare Other | Admitting: Adult Health

## 2021-11-14 ENCOUNTER — Encounter: Payer: Self-pay | Admitting: Adult Health

## 2021-11-14 DIAGNOSIS — N1832 Chronic kidney disease, stage 3b: Secondary | ICD-10-CM

## 2021-11-14 DIAGNOSIS — J4550 Severe persistent asthma, uncomplicated: Secondary | ICD-10-CM | POA: Diagnosis not present

## 2021-11-14 DIAGNOSIS — I10 Essential (primary) hypertension: Secondary | ICD-10-CM | POA: Diagnosis not present

## 2021-11-14 DIAGNOSIS — F02818 Dementia in other diseases classified elsewhere, unspecified severity, with other behavioral disturbance: Secondary | ICD-10-CM

## 2021-11-14 DIAGNOSIS — K219 Gastro-esophageal reflux disease without esophagitis: Secondary | ICD-10-CM | POA: Diagnosis not present

## 2021-11-14 DIAGNOSIS — K5901 Slow transit constipation: Secondary | ICD-10-CM

## 2021-11-14 DIAGNOSIS — G301 Alzheimer's disease with late onset: Secondary | ICD-10-CM | POA: Diagnosis not present

## 2021-11-14 NOTE — Progress Notes (Signed)
?Location:    [place] ?Nursing Home Room Number: 053 ?Place of Service:  SNF (31) ?Provider:  Royal Hawthorn, NP ? ?Virgie Dad, MD ? ?Patient Care Team: ?Virgie Dad, MD as PCP - General (Internal Medicine) ?Elsie Stain, MD (Pulmonary Disease) ? ?Extended Emergency Contact Information ?Primary Emergency Contact: [place],Suzanne ? [place] ?Home Phone: [phone] ?Mobile Phone: [phone] ?Relation: Daughter ? ?Code Status:  DNR ?Goals of care: Advanced Directive information ? ?  11/14/2021  ? 11:04 AM  ?Advanced Directives  ?Does Patient Have a Medical Advance Directive? Yes  ?Type of Paramedic of [place];Living will;Out of facility DNR (pink MOST or yellow form)  ?Does patient want to make changes to medical advance directive? No - Patient declined  ?Copy of [place] in Chart? Yes - validated most recent copy scanned in chart (See row information)  ?Pre-existing out of facility DNR order (yellow form or pink MOST form) Yellow form placed in chart (order not valid for inpatient use)  ? ? ? ?Chief Complaint  ?Patient presents with  ? Medical Management of Chronic Issues  ? ? ?HPI:  ?Pt is a 88 y.o. female seen today for medical management of chronic diseases.   ?PMH significant for asthma, OP, arthritis, Vit d deficiency, depression, dementia, HTN, HLD, hyperlipidemia, seasonal allergies, OA, and GERD.  ?Caregiver reports she is sleepy in the morning and falls asleep while eating breakfast. If she eats while groggy she coughs. Other wise she is not coughing or wheezing. Still on a regular diet.  ?BP 100-160/70-80s ?As gained 6 lbs in the past two months. On boost. Eating chocolate bunny when I enter the room. No sob or edema. No acute complaints.  ?Wt Readings from Last 3 Encounters:  ?11/14/21 160 lb 3.2 oz (72.7 kg)  ?10/07/21 157 lb 3.2 oz (71.3 kg)  ?09/26/21 154 lb 3.2 oz (69.9 kg)  ? ?AD: not ambulatory  and requires assistance for all ADLs. Pleasant but can become agitated in the afternoon. Has delusions and sometimes takes vistaril which is effective per nursing notes.  ? ?Past Medical History:  ?Diagnosis Date  ? Age-related osteoporosis without current pathological fracture   ? Alzheimer's disease ([place])   ? with late onset  ? Arthritis   ? Asthma   ? Deficiency of other specified B group vitamins   ? Depression   ? Environmental allergies   ? mold  ? Fall   ? GERD without esophagitis   ? Hyperlipidemia   ? Hypertension   ? Hypertensive kidney disease with CKD (chronic kidney disease)   ? with stage I through stage 4 CKD   ? Memory loss   ? Seasonal allergies   ? Unspecified osteoarthritis, unspecified site   ? ?Past Surgical History:  ?Procedure Laterality Date  ? HIP ARTHROPLASTY  06/25/2011  ? Procedure: ARTHROPLASTY BIPOLAR HIP;  Surgeon: Laurice Record [place];  Location: [facility];  Service: Orthopedics;  Laterality: Right;  ? NASAL SINUS SURGERY    ? VAGINAL HYSTERECTOMY    ? ? ?Allergies  ?Allergen Reactions  ? Azithromycin Other (See Comments)  ?  Nausea, weakness   ? Aspirin Other (See Comments)  ?  Unknown reaction  ? Molds & Smuts   ? Septra [Sulfamethoxazole-Trimethoprim]   ? Sulfa Antibiotics   ? Sulfonamide Derivatives Other (See Comments)  ?  Unknown reaction  ? ? ?Allergies as of 11/14/2021   ? ?   Reactions  ?  Azithromycin Other (See Comments)  ? Nausea, weakness   ? Aspirin Other (See Comments)  ? Unknown reaction  ? Molds & Smuts   ? Septra [sulfamethoxazole-trimethoprim]   ? Sulfa Antibiotics   ? Sulfonamide Derivatives Other (See Comments)  ? Unknown reaction  ? ?  ? ?  ?Medication List  ?  ? ?  ? Accurate as of November 14, 2021 11:04 AM. If you have any questions, ask your nurse or doctor.  ?  ?  ? ?  ? ?acetaminophen 500 MG tablet ?Commonly known as: TYLENOL ?Take 500 mg by mouth in the morning, at noon, and at bedtime. ?  ?benzonatate 200 MG capsule ?Commonly known as: TESSALON ?Take 1 capsule (200  mg total) by mouth 3 (three) times daily as needed for cough. ?  ?budesonide 0.5 MG/2ML nebulizer solution ?Commonly known as: PULMICORT ?Take 0.5 mg by nebulization 2 (two) times daily. ?  ?CALCIUM 600-D PO ?Take 1 tablet by mouth daily. ?  ?dextromethorphan 30 MG/5ML liquid ?Commonly known as: DELSYM ?Take 60 mg by mouth every 12 (twelve) hours as needed for cough. ?  ?docusate sodium 100 MG capsule ?Commonly known as: COLACE ?Take 100 mg by mouth at bedtime. ?  ?Dupixent 300 MG/2ML prefilled syringe ?Generic drug: dupilumab ?Inject 300 mg into the skin every 14 (fourteen) days. ?  ?FLUoxetine 20 MG capsule ?Commonly known as: PROZAC ?Take 40 mg by mouth daily. ?  ?fluticasone 50 MCG/ACT nasal spray ?Commonly known as: FLONASE ?Place 2 sprays into both nostrils daily. 8 am - 11 am ?  ?furosemide 20 MG tablet ?Commonly known as: LASIX ?Take 20 mg by mouth daily. ?  ?guaiFENesin 100 MG/5ML liquid ?Commonly known as: Mucinex Fast-Max Chest Cong MS ?Take 5 mLs by mouth every 4 (four) hours as needed for cough or to loosen phlegm. ?  ?hydrOXYzine 25 MG tablet ?Commonly known as: ATARAX ?Take 25 mg by mouth 2 (two) times daily as needed. ?  ?ipratropium-albuterol 0.5-2.5 (3) MG/3ML Soln ?Commonly known as: DUONEB ?Take 3 mLs by nebulization every 6 (six) hours as needed. ?  ?lactose free nutrition Liqd ?Take 237 mLs by mouth daily. ?  ?memantine 10 MG tablet ?Commonly known as: Namenda ?Take 1 tablet (10 mg total) by mouth 2 (two) times daily. ?  ?montelukast 10 MG tablet ?Commonly known as: SINGULAIR ?Take 1 tablet (10 mg total) by mouth daily. ?  ?omeprazole 20 MG capsule ?Commonly known as: PRILOSEC ?Take 20 mg by mouth 2 (two) times daily. ?  ?ondansetron 4 MG tablet ?Commonly known as: ZOFRAN ?Take 4 mg by mouth every 8 (eight) hours as needed for nausea or vomiting. ?  ?Perforomist 20 MCG/2ML nebulizer solution ?Generic drug: formoterol ?Take 20 mcg by nebulization 2 (two) times daily. ?  ?polyethylene glycol 17 g  packet ?Commonly known as: MIRALAX / GLYCOLAX ?Take 17 g by mouth daily as needed. ?  ?Systane 0.4-0.3 % Gel ophthalmic gel ?Generic drug: Polyethyl Glycol-Propyl Glycol ?Place 1 application into both eyes at bedtime. ?  ?triamcinolone cream 0.1 % ?Commonly known as: KENALOG ?Apply 1 application topically as needed. ?  ?vitamin B-12 1000 MCG tablet ?Commonly known as: CYANOCOBALAMIN ?Take 3,000 mcg by mouth daily. ?  ?Vitamin D 50 MCG (2000 UT) tablet ?Take 2,000 Units by mouth at bedtime. ?  ? ?  ? ? ?Review of Systems  ?Unable to perform ROS: Dementia  ? ?Immunization History  ?Administered Date(s) Administered  ? Influenza Inj Mdck Quad Pf 05/18/2019, 05/07/2021  ?  Influenza Split 04/04/2012, 05/03/2013, 05/10/2015  ? Influenza Whole 04/18/2008, 06/04/2009, 04/03/2010, 03/30/2011  ? Influenza, High Dose Seasonal PF 04/20/2018, 05/24/2020  ? Influenza,inj,Quad PF,6+ Mos 03/26/2017  ? Influenza-Unspecified 03/26/2010, 04/13/2012, 05/08/2013, 05/14/2014, 06/03/2014, 05/13/2015, 04/27/2016, 04/29/2016  ? Moderna SARS-COV2 Booster Vaccination 06/13/2020, 03/25/2021, 03/27/2021  ? Moderna Sars-Covid-2 Vaccination 09/05/2019, 10/03/2019, 06/13/2020, 03/25/2021, 05/14/2021  ? Pneumococcal Conjugate-13 03/05/2014  ? Pneumococcal Polysaccharide-23 06/04/2009  ? Pneumococcal-Unspecified 03/13/2009  ? Td 03/13/2009, 04/13/2012  ? Zoster Recombinat (Shingrix) 08/03/2017, 10/01/2017  ? Zoster, Live 03/21/2010  ? ?Pertinent  Health Maintenance Due  ?Topic Date Due  ? INFLUENZA VACCINE  03/03/2022  ? DEXA SCAN  Discontinued  ? ? ?  12/07/2012  ? 10:00 AM 01/11/2020  ?  9:49 AM 09/26/2021  ?  1:02 PM  ?Fall Risk  ?Falls in the past year? Yes 0 0  ?Was there an injury with Fall?  0 0  ?Fall Risk Category Calculator  0 0  ?Fall Risk Category  Low Low  ?Patient Fall Risk Level  Low fall risk Low fall risk  ?Patient at Risk for Falls Due to Impaired balance/gait  Impaired balance/gait  ?Fall risk Follow up   Falls evaluation completed   ? ?Functional Status Survey: ?  ? ?Vitals:  ? 11/14/21 1050  ?BP: (!) 144/70  ?Pulse: 61  ?Resp: 16  ?Temp: (!) 97.2 ?F (36.2 ?C)  ?SpO2: 98%  ?Weight: 160 lb 3.2 oz (72.7 kg)  ?Height: '5\' 2"'$  (1.575

## 2021-11-28 DIAGNOSIS — Z23 Encounter for immunization: Secondary | ICD-10-CM | POA: Diagnosis not present

## 2021-12-10 ENCOUNTER — Encounter: Payer: Self-pay | Admitting: Pulmonary Disease

## 2021-12-10 ENCOUNTER — Ambulatory Visit (INDEPENDENT_AMBULATORY_CARE_PROVIDER_SITE_OTHER): Payer: Medicare Other | Admitting: Pulmonary Disease

## 2021-12-10 VITALS — BP 122/66 | HR 66 | Temp 98.0°F | Wt 160.0 lb

## 2021-12-10 DIAGNOSIS — J4550 Severe persistent asthma, uncomplicated: Secondary | ICD-10-CM

## 2021-12-10 NOTE — Patient Instructions (Signed)
Nice to see you again ? ?No medication changes ? ?I am glad you are doing well ? ?Return to clinic in 6 months or sooner as needed with Dr. Silas Flood ?

## 2021-12-10 NOTE — Progress Notes (Signed)
? ?'@Patient'$  ID: [name], female    DOB: 1924-03-21, 88 y.o.   MRN: [id number] ? ?Chief Complaint  ?Patient presents with  ? Follow-up  ?  Pt states she is doing well since last visit. Pt states that the cough is gone since last visit as well. Pt is on Dupxient shots, duoneb treatments and singular, and albuterol as needed.   ? ? ?Referring provider: ?Virgie Dad, MD ? ?HPI:  ? ?49 y.o. woman with asthma here for follow up of the same. ? ?Symptoms well controlled. On nebulized ICS/LABA therapy as well as Dupixent.  Baseline dyspnea but largely unchanged.  Breathing seems stable.  In wheelchair today which is her norm.  At last visit, worried about cough that was relatively acute in onset chest x-ray was concerning for pneumonia.  She was placed on Augmentin with improvement. ? ?PMH: asthma ?Surgical History: Hip surgery, nasal sinus surgery, hysterectomy ?Family History: father with colon cancer, no significant respiratory disease in first degree relative ? ?Questionaires / Pulmonary Flowsheets:  ? ?ACT:  ?Asthma Control Test ACT Total Score  ?03/11/2021 ?11:35 AM 19  ? ? ?MMRC: ?   ? View : No data to display.  ?  ?  ?  ? ? ?Epworth:  ?   ? View : No data to display.  ?  ?  ?  ? ? ?Tests:  ? ?FENO:  ?No results found for: NITRICOXIDE ? ?PFT: ?   ? View : No data to display.  ?  ?  ?  ? ? ?WALK:  ?   ? View : No data to display.  ?  ?  ?  ? ? ?Imaging: ?Personally reviewed  and as per EMR and discussion in this note ? ?Lab Results: ?Personally reviewed, stable anemia noted ?CBC ?   ?Component Value Date/Time  ? WBC 6.8 07/14/2021 0000  ? WBC 7.2 02/02/2018 1031  ? RBC 3.91 07/14/2021 0000  ? HGB 11.2 (A) 07/14/2021 0000  ? HCT 35 (A) 07/14/2021 0000  ? PLT 236 07/14/2021 0000  ? MCV 89.8 02/02/2018 1031  ? MCH 30.3 07/24/2017 1822  ? MCHC 34.4 02/02/2018 1031  ? RDW 15.3 02/02/2018 1031  ? LYMPHSABS 0.9 02/02/2018 1031  ? MONOABS 0.5 02/02/2018 1031  ? EOSABS 0.7 02/02/2018 1031  ? BASOSABS 0.0 02/02/2018  1031  ? ? ?BMET ?   ?Component Value Date/Time  ? NA 144 07/14/2021 0000  ? K 4.2 07/14/2021 0000  ? CL 105 07/14/2021 0000  ? CO2 24 (A) 07/14/2021 0000  ? GLUCOSE 90 02/02/2018 1031  ? BUN 31 (A) 07/14/2021 0000  ? CREATININE 1.0 07/14/2021 0000  ? CREATININE 1.14 02/02/2018 1031  ? CALCIUM 9.5 07/14/2021 0000  ? GFRNONAA 59 (L) 07/24/2017 1822  ? GFRAA >60 07/24/2017 1822  ? ? ?BNP ?No results found for: BNP ? ?ProBNP ?   ?Component Value Date/Time  ? PROBNP 37.0 02/02/2018 1031  ? ? ?Specialty Problems   ? ?  ? Pulmonary Problems  ? Allergic rhinitis  ?  Qualifier: Diagnosis of  ?By: Joya Gaskins MD, Burnett Harry ? ?  ?  ? Moderate persistent asthma  ?    ?  ?  ? Vocal cord dysfunction  ? Severe persistent asthma without complication  ? ? ?Allergies  ?Allergen Reactions  ? Azithromycin Other (See Comments)  ?  Nausea, weakness   ? Aspirin Other (See Comments)  ?  Unknown reaction  ? Molds & Smuts   ?  Septra [Sulfamethoxazole-Trimethoprim]   ? Sulfa Antibiotics   ? Sulfonamide Derivatives Other (See Comments)  ?  Unknown reaction  ? ? ?Immunization History  ?Administered Date(s) Administered  ? Influenza Inj Mdck Quad Pf 05/18/2019, 05/07/2021  ? Influenza Split 04/04/2012, 05/03/2013, 05/10/2015  ? Influenza Whole 04/18/2008, 06/04/2009, 04/03/2010, 03/30/2011  ? Influenza, High Dose Seasonal PF 04/20/2018, 05/24/2020  ? Influenza,inj,Quad PF,6+ Mos 03/26/2017  ? Influenza-Unspecified 03/26/2010, 04/13/2012, 05/08/2013, 05/14/2014, 06/03/2014, 05/13/2015, 04/27/2016, 04/29/2016  ? Moderna SARS-COV2 Booster Vaccination 06/13/2020, 03/25/2021, 03/27/2021  ? Moderna Sars-Covid-2 Vaccination 09/05/2019, 10/03/2019, 06/13/2020, 03/25/2021, 05/14/2021  ? Pneumococcal Conjugate-13 03/05/2014  ? Pneumococcal Polysaccharide-23 06/04/2009  ? Pneumococcal-Unspecified 03/13/2009  ? Td 03/13/2009, 04/13/2012  ? Zoster Recombinat (Shingrix) 08/03/2017, 10/01/2017  ? Zoster, Live 03/21/2010  ? ? ?Past Medical History:  ?Diagnosis Date   ? Age-related osteoporosis without current pathological fracture   ? Alzheimer's disease ([place])   ? with late onset  ? Arthritis   ? Asthma   ? Deficiency of other specified B group vitamins   ? Depression   ? Environmental allergies   ? mold  ? Fall   ? GERD without esophagitis   ? Hyperlipidemia   ? Hypertension   ? Hypertensive kidney disease with CKD (chronic kidney disease)   ? with stage I through stage 4 CKD   ? Memory loss   ? Seasonal allergies   ? Unspecified osteoarthritis, unspecified site   ? ? ?Tobacco History: ?Social History  ? ?Tobacco Use  ?Smoking Status Never  ?Smokeless Tobacco Never  ? ?Counseling given: Not Answered ? ? ?Continue to not smoke ? ?Outpatient Encounter Medications as of 12/10/2021  ?Medication Sig  ? acetaminophen (TYLENOL) 500 MG tablet Take 500 mg by mouth in the morning, at noon, and at bedtime.   ? benzonatate (TESSALON) 200 MG capsule Take 1 capsule (200 mg total) by mouth 3 (three) times daily as needed for cough.  ? budesonide (PULMICORT) 0.5 MG/2ML nebulizer solution Take 0.5 mg by nebulization 2 (two) times daily.  ? Calcium Carb-Cholecalciferol (CALCIUM 600-D PO) Take 1 tablet by mouth daily.  ? Cholecalciferol (VITAMIN D) 2000 units tablet Take 2,000 Units by mouth at bedtime.  ? dextromethorphan (DELSYM) 30 MG/5ML liquid Take 60 mg by mouth every 12 (twelve) hours as needed for cough.  ? docusate sodium (COLACE) 100 MG capsule Take 100 mg by mouth at bedtime.   ? dupilumab (DUPIXENT) 300 MG/2ML prefilled syringe Inject 300 mg into the skin every 14 (fourteen) days.  ? FLUoxetine (PROZAC) 20 MG capsule Take 40 mg by mouth daily.  ? fluticasone (FLONASE) 50 MCG/ACT nasal spray Place 2 sprays into both nostrils daily. 8 am - 11 am  ? formoterol (PERFOROMIST) 20 MCG/2ML nebulizer solution Take 20 mcg by nebulization 2 (two) times daily.  ? furosemide (LASIX) 20 MG tablet Take 20 mg by mouth daily.  ? guaiFENesin (MUCINEX FAST-MAX CHEST CONG MS) 100 MG/5ML liquid Take 5 mLs  by mouth every 4 (four) hours as needed for cough or to loosen phlegm.  ? hydrOXYzine (ATARAX) 25 MG tablet Take 25 mg by mouth 2 (two) times daily as needed.  ? ipratropium-albuterol (DUONEB) 0.5-2.5 (3) MG/3ML SOLN Take 3 mLs by nebulization every 6 (six) hours as needed.  ? lactose free nutrition (BOOST) LIQD Take 237 mLs by mouth daily.  ? memantine (NAMENDA) 10 MG tablet Take 1 tablet (10 mg total) by mouth 2 (two) times daily.  ? montelukast (SINGULAIR) 10 MG tablet Take 1 tablet (  10 mg total) by mouth daily.  ? omeprazole (PRILOSEC) 20 MG capsule Take 20 mg by mouth 2 (two) times daily.  ? ondansetron (ZOFRAN) 4 MG tablet Take 4 mg by mouth every 8 (eight) hours as needed for nausea or vomiting.  ? Polyethyl Glycol-Propyl Glycol (SYSTANE) 0.4-0.3 % GEL ophthalmic gel Place 1 application into both eyes at bedtime.  ? polyethylene glycol (MIRALAX / GLYCOLAX) packet Take 17 g by mouth daily as needed.  ? triamcinolone cream (KENALOG) 0.1 % Apply 1 application topically as needed.  ? vitamin B-12 (CYANOCOBALAMIN) 1000 MCG tablet Take 3,000 mcg by mouth daily.  ? [DISCONTINUED] rivaroxaban (XARELTO) 10 MG TABS tablet Take 1 tablet (10 mg total) by mouth daily.  ? ?No facility-administered encounter medications on file as of 12/10/2021.  ? ? ? ?Review of Systems ? ?Review of Systems  ?N/a ?Physical Exam ? ?BP 122/66 (BP Location: Left Arm, Patient Position: Sitting, Cuff Size: Normal)   Pulse 66   Temp 98 ?F (36.7 ?C) (Oral)   Wt 160 lb (72.6 kg)   SpO2 92%   BMI 29.26 kg/m?  ? ?Wt Readings from Last 5 Encounters:  ?12/10/21 160 lb (72.6 kg)  ?11/14/21 160 lb 3.2 oz (72.7 kg)  ?10/07/21 157 lb 3.2 oz (71.3 kg)  ?09/26/21 154 lb 3.2 oz (69.9 kg)  ?09/08/21 154 lb 3.2 oz (69.9 kg)  ? ? ?BMI Readings from Last 5 Encounters:  ?12/10/21 29.26 kg/m?  ?11/14/21 29.30 kg/m?  ?10/07/21 28.75 kg/m?  ?09/26/21 28.20 kg/m?  ?09/08/21 28.20 kg/m?  ? ? ? ?Physical Exam ?General: Sitting in wheelchair, no acute  distress ?Eyes: EOMI, no icterus ?Neck: Supple, no JVD appreciated ?Cardiovascular: Regular rate and rhythm, no murmur ?Pulmonary: Normal work of breathing, mild MS freeways left upper lung field ?MSK: No synovitis, no j

## 2021-12-15 ENCOUNTER — Non-Acute Institutional Stay (SKILLED_NURSING_FACILITY): Payer: Medicare Other | Admitting: Internal Medicine

## 2021-12-15 ENCOUNTER — Encounter: Payer: Self-pay | Admitting: Internal Medicine

## 2021-12-15 DIAGNOSIS — J4550 Severe persistent asthma, uncomplicated: Secondary | ICD-10-CM

## 2021-12-15 DIAGNOSIS — K219 Gastro-esophageal reflux disease without esophagitis: Secondary | ICD-10-CM

## 2021-12-15 DIAGNOSIS — F02818 Dementia in other diseases classified elsewhere, unspecified severity, with other behavioral disturbance: Secondary | ICD-10-CM | POA: Diagnosis not present

## 2021-12-15 DIAGNOSIS — G301 Alzheimer's disease with late onset: Secondary | ICD-10-CM

## 2021-12-15 DIAGNOSIS — F325 Major depressive disorder, single episode, in full remission: Secondary | ICD-10-CM | POA: Diagnosis not present

## 2021-12-15 DIAGNOSIS — I10 Essential (primary) hypertension: Secondary | ICD-10-CM | POA: Diagnosis not present

## 2021-12-15 DIAGNOSIS — N1832 Chronic kidney disease, stage 3b: Secondary | ICD-10-CM | POA: Diagnosis not present

## 2021-12-15 NOTE — Progress Notes (Signed)
Location:    [facility] Room Number: 121 Place of Service:  SNF [phone]) Provider:  Veleta Miners MD   Julia Dad, MD  Patient Care Team: Julia Dad, MD as PCP - General (Internal Medicine) Elsie Stain, MD (Pulmonary Disease)  Extended Emergency Contact Information Primary Emergency Contact: Earlene Plater of [place] Phone: [phone] Mobile Phone: [phone] Relation: Daughter  Code Status:  DNR Goals of care: Advanced Directive information    12/15/2021    1:52 PM  Advanced Directives  Does Patient Have a Medical Advance Directive? Yes  Type of Paramedic of [place];Living will;Out of facility DNR (pink MOST or yellow form)  Does patient want to make changes to medical advance directive? No - Patient declined  Copy of [place] in Chart? Yes - validated most recent copy scanned in chart (See row information)  Pre-existing out of facility DNR order (yellow form or pink MOST form) Yellow form placed in chart (order not valid for inpatient use)     Chief Complaint  Patient presents with   Medical Management of Chronic Issues    HPI:  Pt is a 88 y.o. female seen today for medical management of chronic diseases.    Lives in SNF  Patient has a history of Alzheimer's dementia dependent for her ADLs Osteoporosis and knee osteoarthritis CKD stage 3b Severe asthma Eosinophilic HLD and hypertension    She is stable. No new Nursing issues. No Behavior issues Her weight is stable She is stand up lift. Needs help with her feeding No Falls Wt Readings from Last 3 Encounters:  12/15/21 160 lb 3.2 oz (72.7 kg)  12/10/21 160 lb (72.6 kg)  11/14/21 160 lb 3.2 oz (72.7 kg)   Past Medical History:  Diagnosis Date   Age-related osteoporosis without current pathological fracture    Alzheimer's disease ([place])    with late onset   Arthritis    Asthma     Deficiency of other specified B group vitamins    Depression    Environmental allergies    mold   Fall    GERD without esophagitis    Hyperlipidemia    Hypertension    Hypertensive kidney disease with CKD (chronic kidney disease)    with stage I through stage 4 CKD    Memory loss    Seasonal allergies    Unspecified osteoarthritis, unspecified site    Past Surgical History:  Procedure Laterality Date   HIP ARTHROPLASTY  06/25/2011   Procedure: ARTHROPLASTY BIPOLAR HIP;  Surgeon: Laurice Record [place];  Location: [facility];  Service: Orthopedics;  Laterality: Right;   NASAL SINUS SURGERY     VAGINAL HYSTERECTOMY      Allergies  Allergen Reactions   Azithromycin Other (See Comments)    Nausea, weakness    Aspirin Other (See Comments)    Unknown reaction   Molds & Smuts    Septra [Sulfamethoxazole-Trimethoprim]    Sulfa Antibiotics    Sulfonamide Derivatives Other (See Comments)    Unknown reaction    Allergies as of 12/15/2021       Reactions   Azithromycin Other (See Comments)   Nausea, weakness    Aspirin Other (See Comments)   Unknown reaction   Molds & Smuts    Septra [sulfamethoxazole-trimethoprim]    Sulfa Antibiotics    Sulfonamide Derivatives Other (See Comments)   Unknown reaction        Medication List  Accurate as of Dec 15, 2021  1:53 PM. If you have any questions, ask your nurse or doctor.          acetaminophen 500 MG tablet Commonly known as: TYLENOL Take 500 mg by mouth in the morning, at noon, and at bedtime.   benzonatate 200 MG capsule Commonly known as: TESSALON Take 1 capsule (200 mg total) by mouth 3 (three) times daily as needed for cough.   budesonide 0.5 MG/2ML nebulizer solution Commonly known as: PULMICORT Take 0.5 mg by nebulization 2 (two) times daily.   CALCIUM 600-D PO Take 1 tablet by mouth daily.   dextromethorphan 30 MG/5ML liquid Commonly known as: DELSYM Take 60 mg by mouth every 12 (twelve) hours as needed  for cough.   docusate sodium 100 MG capsule Commonly known as: COLACE Take 100 mg by mouth at bedtime.   Dupixent 300 MG/2ML prefilled syringe Generic drug: dupilumab Inject 300 mg into the skin every 14 (fourteen) days.   FLUoxetine 20 MG capsule Commonly known as: PROZAC Take 40 mg by mouth daily.   fluticasone 50 MCG/ACT nasal spray Commonly known as: FLONASE Place 2 sprays into both nostrils daily. 8 am - 11 am   furosemide 20 MG tablet Commonly known as: LASIX Take 20 mg by mouth daily.   guaiFENesin 100 MG/5ML liquid Commonly known as: Mucinex Fast-Max Chest Cong MS Take 5 mLs by mouth every 4 (four) hours as needed for cough or to loosen phlegm.   hydrOXYzine 25 MG tablet Commonly known as: ATARAX Take 25 mg by mouth 2 (two) times daily as needed.   ipratropium-albuterol 0.5-2.5 (3) MG/3ML Soln Commonly known as: DUONEB Take 3 mLs by nebulization every 6 (six) hours as needed.   lactose free nutrition Liqd Take 237 mLs by mouth daily.   memantine 10 MG tablet Commonly known as: Namenda Take 1 tablet (10 mg total) by mouth 2 (two) times daily.   montelukast 10 MG tablet Commonly known as: SINGULAIR Take 1 tablet (10 mg total) by mouth daily.   omeprazole 20 MG capsule Commonly known as: PRILOSEC Take 20 mg by mouth 2 (two) times daily.   ondansetron 4 MG tablet Commonly known as: ZOFRAN Take 4 mg by mouth every 8 (eight) hours as needed for nausea or vomiting.   Perforomist 20 MCG/2ML nebulizer solution Generic drug: formoterol Take 20 mcg by nebulization 2 (two) times daily.   polyethylene glycol 17 g packet Commonly known as: MIRALAX / GLYCOLAX Take 17 g by mouth daily as needed.   Systane 0.4-0.3 % Gel ophthalmic gel Generic drug: Polyethyl Glycol-Propyl Glycol Place 1 application into both eyes at bedtime.   triamcinolone cream 0.1 % Commonly known as: KENALOG Apply 1 application topically as needed.   vitamin B-12 1000 MCG  tablet Commonly known as: CYANOCOBALAMIN Take 3,000 mcg by mouth daily.   Vitamin D 50 MCG (2000 UT) tablet Take 2,000 Units by mouth at bedtime.        Review of Systems  Unable to perform ROS: Dementia   Immunization History  Administered Date(s) Administered   Influenza Inj Mdck Quad Pf 05/18/2019, 05/07/2021   Influenza Split 04/04/2012, 05/03/2013, 05/10/2015   Influenza Whole 04/18/2008, 06/04/2009, 04/03/2010, 03/30/2011   Influenza, High Dose Seasonal PF 04/20/2018, 05/24/2020   Influenza,inj,Quad PF,6+ Mos 03/26/2017   Influenza-Unspecified 03/26/2010, 04/13/2012, 05/08/2013, 05/14/2014, 06/03/2014, 05/13/2015, 04/27/2016, 04/29/2016   Moderna SARS-COV2 Booster Vaccination 06/13/2020, 03/25/2021, 03/27/2021   Moderna Sars-Covid-2 Vaccination 09/05/2019, 10/03/2019, 06/13/2020, 03/25/2021, 05/14/2021  Pneumococcal Conjugate-13 03/05/2014   Pneumococcal Polysaccharide-23 06/04/2009   Pneumococcal-Unspecified 03/13/2009   Td 03/13/2009, 04/13/2012   Zoster Recombinat (Shingrix) 08/03/2017, 10/01/2017   Zoster, Live 03/21/2010   Pertinent  Health Maintenance Due  Topic Date Due   INFLUENZA VACCINE  03/03/2022   DEXA SCAN  Discontinued      12/07/2012   10:00 AM 01/11/2020    9:49 AM 09/26/2021    1:02 PM  Fall Risk  Falls in the past year? Yes 0 0  Was there an injury with Fall?  0 0  Fall Risk Category Calculator  0 0  Fall Risk Category  Low Low  Patient Fall Risk Level  Low fall risk Low fall risk  Patient at Risk for Falls Due to Impaired balance/gait  Impaired balance/gait  Fall risk Follow up   Falls evaluation completed   Functional Status Survey:    Vitals:   12/15/21 1323  BP: 132/78  Pulse: 68  Resp: 18  Temp: (!) 97 F (36.1 C)  SpO2: 100%  Weight: 160 lb 3.2 oz (72.7 kg)  Height: '5\' 2"'$  (1.575 m)   Body mass index is 29.3 kg/m. Physical Exam Vitals reviewed.  Constitutional:      Appearance: Normal appearance.  HENT:     Head:  Normocephalic.     Nose: Nose normal.     Mouth/Throat:     Mouth: Mucous membranes are moist.     Pharynx: Oropharynx is clear.  Eyes:     Pupils: Pupils are equal, round, and reactive to light.  Cardiovascular:     Rate and Rhythm: Normal rate and regular rhythm.     Pulses: Normal pulses.     Heart sounds: Normal heart sounds. No murmur heard. Pulmonary:     Effort: Pulmonary effort is normal.     Breath sounds: Normal breath sounds.  Abdominal:     General: Abdomen is flat. Bowel sounds are normal.     Palpations: Abdomen is soft.  Musculoskeletal:        General: Swelling present.     Cervical back: Neck supple.  Skin:    General: Skin is warm.  Neurological:     General: No focal deficit present.     Mental Status: She is alert.  Psychiatric:        Mood and Affect: Mood normal.        Thought Content: Thought content normal.    Labs reviewed: Recent Labs    07/14/21 0000  NA 144  K 4.2  CL 105  CO2 24*  BUN 31*  CREATININE 1.0  CALCIUM 9.5   No results for input(s): AST, ALT, ALKPHOS, BILITOT, PROT, ALBUMIN in the last 8760 hours. Recent Labs    07/14/21 0000  WBC 6.8  HGB 11.2*  HCT 35*  PLT 236   Lab Results  Component Value Date   TSH 1.52 09/23/2020   No results found for: HGBA1C No results found for: CHOL, HDL, LDLCALC, LDLDIRECT, TRIG, CHOLHDL  Significant Diagnostic Results in last 30 days:  No results found.  Assessment/Plan 1. Essential hypertension with LE edema Low dose of Lasix  2. Dementia of the Alzheimer's type, with late onset, with delusions ([place]) Continue Namenda and Vistaril In SNF  3. Severe persistent asthma without complication On Dupixent Pulmicort Also on Flonase and Performist Follows with Pulmonary 4. Gastro-esophageal reflux disease without esophagitis On Prilosec  5. Stage 3b chronic kidney disease ([facility]) Creat stable  6. Major depressive disorder, single  episode, in remission [facility]) Continue  Prozac    Family/ staff Communication:   Labs/tests ordered:

## 2021-12-31 ENCOUNTER — Encounter: Payer: Self-pay | Admitting: Internal Medicine

## 2021-12-31 DIAGNOSIS — Z9189 Other specified personal risk factors, not elsewhere classified: Secondary | ICD-10-CM | POA: Diagnosis not present

## 2022-01-02 ENCOUNTER — Encounter: Payer: Self-pay | Admitting: Adult Health

## 2022-01-02 DIAGNOSIS — R059 Cough, unspecified: Secondary | ICD-10-CM | POA: Diagnosis not present

## 2022-01-08 ENCOUNTER — Encounter: Payer: Self-pay | Admitting: Adult Health

## 2022-01-08 ENCOUNTER — Encounter: Payer: Self-pay | Admitting: Nurse Practitioner

## 2022-01-08 ENCOUNTER — Non-Acute Institutional Stay (SKILLED_NURSING_FACILITY): Payer: Medicare Other | Admitting: Adult Health

## 2022-01-08 DIAGNOSIS — J4551 Severe persistent asthma with (acute) exacerbation: Secondary | ICD-10-CM | POA: Diagnosis not present

## 2022-01-08 MED ORDER — PREDNISONE 20 MG PO TABS: 20.0000 mg | ORAL_TABLET | Freq: Two times a day (BID) | ORAL | 0 refills | Status: AC

## 2022-01-08 NOTE — Progress Notes (Signed)
This encounter was created in error - please disregard.

## 2022-01-08 NOTE — Progress Notes (Signed)
Location:  [facility] Room Number: 121-A Place of Service:  SNF [phone]) Provider:  Royal Hawthorn, NP    Patient Care Team: Virgie Dad, MD as PCP - General (Internal Medicine) Elsie Stain, MD (Pulmonary Disease)  Extended Emergency Contact Information Primary Emergency Contact: Earlene Plater of [place] Phone: [phone] Mobile Phone: [phone] Relation: Daughter  Code Status: DNR Goals of care: Advanced Directive information    01/08/2022    3:53 PM  Advanced Directives  Does Patient Have a Medical Advance Directive? Yes  Type of Paramedic of [place];Living will;Out of facility DNR (pink MOST or yellow form)  Does patient want to make changes to medical advance directive? No - Patient declined  Copy of [place] in Chart? Yes - validated most recent copy scanned in chart (See row information)  Pre-existing out of facility DNR order (yellow form or pink MOST form) Yellow form placed in chart (order not valid for inpatient use)     Chief Complaint  Patient presents with   Acute Visit    Wheezing     HPI:  Pt is a 88 y.o. female seen today for an acute visit for wheezing.  She has severe asthma and is followed by Dr Silas Flood with pulmonary. She is on nebulized ICS/LABA and Dupixent.  She had a CXR done for cough and wheeze on 6/2 which showed no acute process. Has neg covid/flu swab. She lives in skilled care and several are sick with cough and negative for covid. Symptoms of cough and wheeze present for one week. No sob. No decreased 02 sats. No sputum production or fever.    Past Medical History:  Diagnosis Date   Age-related osteoporosis without current pathological fracture    Alzheimer's disease ([place])    with late onset   Arthritis    Asthma    Deficiency of other specified B group vitamins    Depression    Environmental allergies    mold   Fall     GERD without esophagitis    Hyperlipidemia    Hypertension    Hypertensive kidney disease with CKD (chronic kidney disease)    with stage I through stage 4 CKD    Memory loss    Seasonal allergies    Unspecified osteoarthritis, unspecified site    Past Surgical History:  Procedure Laterality Date   HIP ARTHROPLASTY  06/25/2011   Procedure: ARTHROPLASTY BIPOLAR HIP;  Surgeon: Laurice Record [place];  Location: [facility];  Service: Orthopedics;  Laterality: Right;   NASAL SINUS SURGERY     VAGINAL HYSTERECTOMY      Allergies  Allergen Reactions   Azithromycin Other (See Comments)    Nausea, weakness    Aspirin Other (See Comments)    Unknown reaction   Molds & Smuts    Septra [Sulfamethoxazole-Trimethoprim]    Sulfa Antibiotics    Sulfonamide Derivatives Other (See Comments)    Unknown reaction    Outpatient Encounter Medications as of 01/08/2022  Medication Sig   acetaminophen (TYLENOL) 500 MG tablet Take 500 mg by mouth in the morning, at noon, and at bedtime.    benzonatate (TESSALON) 200 MG capsule Take 1 capsule (200 mg total) by mouth 3 (three) times daily as needed for cough.   budesonide (PULMICORT) 0.5 MG/2ML nebulizer solution Take 0.5 mg by nebulization 2 (two) times daily.   Calcium Carb-Cholecalciferol (CALCIUM 600-D PO) Take 1 tablet by mouth daily.   Cholecalciferol (  VITAMIN D) 2000 units tablet Take 2,000 Units by mouth at bedtime.   dextromethorphan (DELSYM) 30 MG/5ML liquid Take 60 mg by mouth every 12 (twelve) hours as needed for cough.   docusate sodium (COLACE) 100 MG capsule Take 100 mg by mouth at bedtime.    dupilumab (DUPIXENT) 300 MG/2ML prefilled syringe Inject 300 mg into the skin every 14 (fourteen) days.   FLUoxetine (PROZAC) 20 MG capsule Take 40 mg by mouth daily.   fluticasone (FLONASE) 50 MCG/ACT nasal spray Place 2 sprays into both nostrils daily. 8 am - 11 am   formoterol (PERFOROMIST) 20 MCG/2ML nebulizer solution Take 20 mcg by nebulization 2  (two) times daily.   furosemide (LASIX) 20 MG tablet Take 20 mg by mouth daily.   guaiFENesin (MUCINEX FAST-MAX CHEST CONG MS) 100 MG/5ML liquid Take 5 mLs by mouth every 4 (four) hours as needed for cough or to loosen phlegm.   hydrOXYzine (ATARAX) 25 MG tablet Take 25 mg by mouth 2 (two) times daily as needed.   ipratropium-albuterol (DUONEB) 0.5-2.5 (3) MG/3ML SOLN Take 3 mLs by nebulization every 6 (six) hours as needed.   lactose free nutrition (BOOST) LIQD Take 237 mLs by mouth daily.   memantine (NAMENDA) 10 MG tablet Take 1 tablet (10 mg total) by mouth 2 (two) times daily.   montelukast (SINGULAIR) 10 MG tablet Take 1 tablet (10 mg total) by mouth daily.   omeprazole (PRILOSEC) 20 MG capsule Take 20 mg by mouth daily.   ondansetron (ZOFRAN) 4 MG tablet Take 4 mg by mouth every 8 (eight) hours as needed for nausea or vomiting.   Polyethyl Glycol-Propyl Glycol (SYSTANE) 0.4-0.3 % GEL ophthalmic gel Place 1 application into both eyes at bedtime.   polyethylene glycol (MIRALAX / GLYCOLAX) packet Take 17 g by mouth daily as needed.   predniSONE (DELTASONE) 20 MG tablet Take 20 mg by mouth in the morning and at bedtime.   triamcinolone cream (KENALOG) 0.1 % Apply 1 application topically as needed.   vitamin B-12 (CYANOCOBALAMIN) 1000 MCG tablet Take 3,000 mcg by mouth daily.   [DISCONTINUED] rivaroxaban (XARELTO) 10 MG TABS tablet Take 1 tablet (10 mg total) by mouth daily.   No facility-administered encounter medications on file as of 01/08/2022.    Review of Systems  Unable to perform ROS: Dementia    Immunization History  Administered Date(s) Administered   Influenza Inj Mdck Quad Pf 05/18/2019, 05/07/2021   Influenza Split 04/04/2012, 05/03/2013, 05/10/2015   Influenza Whole 04/18/2008, 06/04/2009, 04/03/2010, 03/30/2011   Influenza, High Dose Seasonal PF 04/20/2018, 05/24/2020   Influenza,inj,Quad PF,6+ Mos 03/26/2017   Influenza-Unspecified 03/26/2010, 04/13/2012, 05/08/2013,  05/14/2014, 06/03/2014, 05/13/2015, 04/27/2016, 04/29/2016   Moderna SARS-COV2 Booster Vaccination 06/13/2020, 03/25/2021, 03/27/2021   Moderna Sars-Covid-2 Vaccination 09/05/2019, 10/03/2019, 06/13/2020, 03/25/2021, 05/14/2021   Pneumococcal Conjugate-13 03/05/2014   Pneumococcal Polysaccharide-23 06/04/2009   Pneumococcal-Unspecified 03/13/2009   Td 03/13/2009, 04/13/2012   Zoster Recombinat (Shingrix) 08/03/2017, 10/01/2017   Zoster, Live 03/21/2010   Pertinent  Health Maintenance Due  Topic Date Due   INFLUENZA VACCINE  03/03/2022   DEXA SCAN  Discontinued      12/07/2012   10:00 AM 01/11/2020    9:49 AM 09/26/2021    1:02 PM  Fall Risk  Falls in the past year? Yes 0 0  Was there an injury with Fall?  0 0  Fall Risk Category Calculator  0 0  Fall Risk Category  Low Low  Patient Fall Risk Level  Low fall risk  Low fall risk  Patient at Risk for Falls Due to Impaired balance/gait  Impaired balance/gait  Fall risk Follow up   Falls evaluation completed   Functional Status Survey:    Vitals:   01/08/22 1552  BP: 135/74  Pulse: 64  Resp: 16  Temp: (!) 97.2 F (36.2 C)  SpO2: 95%  Weight: 159 lb 3.2 oz (72.2 kg)  Height: '5\' 2"'$  (1.575 m)   Body mass index is 29.12 kg/m. Physical Exam Vitals and nursing note reviewed.  Constitutional:      General: She is not in acute distress.    Appearance: She is not diaphoretic.  HENT:     Head: Normocephalic and atraumatic.     Nose: Nose normal.     Mouth/Throat:     Mouth: Mucous membranes are moist.     Pharynx: Oropharynx is clear.  Eyes:     Conjunctiva/sclera: Conjunctivae normal.     Pupils: Pupils are equal, round, and reactive to light.  Neck:     Vascular: No JVD.  Cardiovascular:     Rate and Rhythm: Normal rate and regular rhythm.     Heart sounds: No murmur heard. Pulmonary:     Effort: Pulmonary effort is normal. No respiratory distress.     Breath sounds: Wheezing present.  Abdominal:     General: Bowel  sounds are normal. There is distension.     Palpations: Abdomen is soft.  Skin:    General: Skin is warm and dry.  Neurological:     General: No focal deficit present.     Mental Status: She is alert. Mental status is at baseline.  Psychiatric:        Mood and Affect: Mood normal.     Labs reviewed: Recent Labs    07/14/21 0000  NA 144  K 4.2  CL 105  CO2 24*  BUN 31*  CREATININE 1.0  CALCIUM 9.5   No results for input(s): "AST", "ALT", "ALKPHOS", "BILITOT", "PROT", "ALBUMIN" in the last 8760 hours. Recent Labs    07/14/21 0000  WBC 6.8  HGB 11.2*  HCT 35*  PLT 236   Lab Results  Component Value Date   TSH 1.52 09/23/2020   No results found for: "HGBA1C" No results found for: "CHOL", "HDL", "LDLCALC", "LDLDIRECT", "TRIG", "CHOLHDL"  Significant Diagnostic Results in last 30 days:  No results found.  Assessment/Plan  1. Severe persistent asthma with exacerbation Triggered by acute viral illness.  No sputum production or fever.   - predniSONE (DELTASONE) 20 MG tablet; Take 1 tablet (20 mg total) by mouth 2 (two) times daily with a meal for 5 days.  Dispense: 10 tablet; Refill: 0  If worsening or not improving call [facility] for further direction Encourage duoneb use for wheeze or sob  Continue Pulmicort and Brovana.

## 2022-01-20 ENCOUNTER — Encounter: Payer: Self-pay | Admitting: Orthopedic Surgery

## 2022-01-20 ENCOUNTER — Non-Acute Institutional Stay (SKILLED_NURSING_FACILITY): Payer: Medicare Other | Admitting: Orthopedic Surgery

## 2022-01-20 DIAGNOSIS — J4551 Severe persistent asthma with (acute) exacerbation: Secondary | ICD-10-CM | POA: Diagnosis not present

## 2022-01-20 DIAGNOSIS — F419 Anxiety disorder, unspecified: Secondary | ICD-10-CM

## 2022-01-20 DIAGNOSIS — I10 Essential (primary) hypertension: Secondary | ICD-10-CM

## 2022-01-20 DIAGNOSIS — M810 Age-related osteoporosis without current pathological fracture: Secondary | ICD-10-CM | POA: Diagnosis not present

## 2022-01-20 DIAGNOSIS — F325 Major depressive disorder, single episode, in full remission: Secondary | ICD-10-CM

## 2022-01-20 DIAGNOSIS — F02818 Dementia in other diseases classified elsewhere, unspecified severity, with other behavioral disturbance: Secondary | ICD-10-CM

## 2022-01-20 DIAGNOSIS — K219 Gastro-esophageal reflux disease without esophagitis: Secondary | ICD-10-CM

## 2022-01-20 DIAGNOSIS — G301 Alzheimer's disease with late onset: Secondary | ICD-10-CM | POA: Diagnosis not present

## 2022-01-20 DIAGNOSIS — L989 Disorder of the skin and subcutaneous tissue, unspecified: Secondary | ICD-10-CM

## 2022-01-20 NOTE — Progress Notes (Signed)
Location:  [facility] Room Number: 121/A Place of Service:  SNF (31) Provider: Yvonna Alanis, NP  Patient Care Team: Virgie Dad, MD as PCP - General (Internal Medicine) Elsie Stain, MD (Pulmonary Disease)  Extended Emergency Contact Information Primary Emergency Contact: Earlene Plater of [place] Phone: [phone] Mobile Phone: [phone] Relation: Daughter  Code Status:  DNR Goals of care: Advanced Directive information    01/20/2022   12:10 PM  Advanced Directives  Does Patient Have a Medical Advance Directive? Yes  Type of Paramedic of [place];Living will;Out of facility DNR (pink MOST or yellow form)  Does patient want to make changes to medical advance directive? No - Patient declined  Copy of [place] in Chart? Yes - validated most recent copy scanned in chart (See row information)  Pre-existing out of facility DNR order (yellow form or pink MOST form) Yellow form placed in chart (order not valid for inpatient use)     Chief Complaint  Patient presents with   Medical Management of Chronic Issues    Routine visit.   Quality Metric Gaps    Discuss the need for additional Covid booster, or post pone if patient refuses.     HPI:  Pt is a 88 y.o. female seen today for medical management of chronic diseases.    She currently resides on the skilled nursing unit at [name] PMH: HTN, allergic rhinitis, asthma, GERD, Alzheimer's, OA, osteoporosis, CKD 3b, and depression.  Alzheimer's- CT head 2018 chronic ischemic microvascular disease, non ambulatory, requires assistance for all ADLs, sometimes will have delusions, remains on Namenda and hydroxyzine prn LLE wound- lesion closed but opened during dressing change this morning, no sign of infection  Asthma- followed by Dr. Silas Flood, 06/02 CXR unremarkable, cough intermittent- given robafen prn, remains on  Dupixent, formoterol nebs and duonebs prn, Singulair and Flonase HTN- BUN/creat 31/1.0 07/14/2021, remains in lasix daily GERD- hgb 11.2 07/14/2021, remains in Prilosec Anxiety- no recent panic attacks, remains on vistaril prn Depression-  no recent mood changes, very pleasant today, remains on Prozac Osteoporosis- DEXA studies discontinued due to goals of care, remains on calcium and vitamin D  No recent falls or injuries. Ambulates with wheelchair.   Recent blood pressures:  06/19- 118/53  06/15- 144/85  06/13- 115/67  Recent weights:  06/08- 159.2 lbs  05/01- 160.1 lbs  04/01- 158 lbs     Past Medical History:  Diagnosis Date   Age-related osteoporosis without current pathological fracture    Alzheimer's disease ([place])    with late onset   Arthritis    Asthma    Deficiency of other specified B group vitamins    Depression    Environmental allergies    mold   Fall    GERD without esophagitis    Hyperlipidemia    Hypertension    Hypertensive kidney disease with CKD (chronic kidney disease)    with stage I through stage 4 CKD    Memory loss    Seasonal allergies    Unspecified osteoarthritis, unspecified site    Past Surgical History:  Procedure Laterality Date   HIP ARTHROPLASTY  06/25/2011   Procedure: ARTHROPLASTY BIPOLAR HIP;  Surgeon: Laurice Record [place];  Location: [facility];  Service: Orthopedics;  Laterality: Right;   NASAL SINUS SURGERY     VAGINAL HYSTERECTOMY      Allergies  Allergen Reactions   Azithromycin Other (See Comments)    Nausea, weakness  Aspirin Other (See Comments)    Unknown reaction   Molds & Smuts    Septra [Sulfamethoxazole-Trimethoprim]    Sulfa Antibiotics    Sulfonamide Derivatives Other (See Comments)    Unknown reaction    Outpatient Encounter Medications as of 01/20/2022  Medication Sig   acetaminophen (TYLENOL) 500 MG tablet Take 500 mg by mouth in the morning, at noon, and at bedtime.    benzonatate (TESSALON) 200 MG  capsule Take 1 capsule (200 mg total) by mouth 3 (three) times daily as needed for cough.   budesonide (PULMICORT) 0.5 MG/2ML nebulizer solution Take 0.5 mg by nebulization 2 (two) times daily.   Calcium Carb-Cholecalciferol (CALCIUM 600-D PO) Take 1 tablet by mouth daily.   Cholecalciferol (VITAMIN D) 2000 units tablet Take 2,000 Units by mouth at bedtime.   dextromethorphan (DELSYM) 30 MG/5ML liquid Take 60 mg by mouth every 12 (twelve) hours as needed for cough.   docusate sodium (COLACE) 100 MG capsule Take 100 mg by mouth at bedtime.    dupilumab (DUPIXENT) 300 MG/2ML prefilled syringe Inject 300 mg into the skin every 14 (fourteen) days.   FLUoxetine (PROZAC) 20 MG capsule Take 40 mg by mouth daily.   fluticasone (FLONASE) 50 MCG/ACT nasal spray Place 2 sprays into both nostrils daily. 8 am - 11 am   formoterol (PERFOROMIST) 20 MCG/2ML nebulizer solution Take 20 mcg by nebulization 2 (two) times daily.   furosemide (LASIX) 20 MG tablet Take 20 mg by mouth daily.   guaiFENesin (MUCINEX FAST-MAX CHEST CONG MS) 100 MG/5ML liquid Take 5 mLs by mouth every 4 (four) hours as needed for cough or to loosen phlegm.   hydrOXYzine (ATARAX) 25 MG tablet Take 25 mg by mouth as needed (agotation and anxiety).   ipratropium-albuterol (DUONEB) 0.5-2.5 (3) MG/3ML SOLN Take 3 mLs by nebulization every 6 (six) hours as needed.   lactose free nutrition (BOOST) LIQD Take 237 mLs by mouth daily.   memantine (NAMENDA) 10 MG tablet Take 1 tablet (10 mg total) by mouth 2 (two) times daily.   montelukast (SINGULAIR) 10 MG tablet Take 1 tablet (10 mg total) by mouth daily.   omeprazole (PRILOSEC) 20 MG capsule Take 20 mg by mouth in the morning and at bedtime.   ondansetron (ZOFRAN) 4 MG tablet Take 4 mg by mouth every 8 (eight) hours as needed for nausea or vomiting.   Polyethyl Glycol-Propyl Glycol (SYSTANE) 0.4-0.3 % GEL ophthalmic gel Place 1 application into both eyes at bedtime.   polyethylene glycol (MIRALAX /  GLYCOLAX) packet Take 17 g by mouth daily as needed.   triamcinolone cream (KENALOG) 0.1 % Apply 1 application topically as needed.   vitamin B-12 (CYANOCOBALAMIN) 1000 MCG tablet Take 3,000 mcg by mouth daily.   [DISCONTINUED] predniSONE (DELTASONE) 20 MG tablet Take 20 mg by mouth in the morning and at bedtime.   [DISCONTINUED] rivaroxaban (XARELTO) 10 MG TABS tablet Take 1 tablet (10 mg total) by mouth daily.   No facility-administered encounter medications on file as of 01/20/2022.    Review of Systems  Unable to perform ROS: Dementia    Immunization History  Administered Date(s) Administered   Influenza Inj Mdck Quad Pf 05/18/2019, 05/07/2021   Influenza Split 04/04/2012, 05/03/2013, 05/10/2015   Influenza Whole 04/18/2008, 06/04/2009, 04/03/2010, 03/30/2011   Influenza, High Dose Seasonal PF 04/20/2018, 05/24/2020   Influenza,inj,Quad PF,6+ Mos 03/26/2017   Influenza-Unspecified 03/26/2010, 04/13/2012, 05/08/2013, 05/14/2014, 06/03/2014, 05/13/2015, 04/27/2016   Moderna Covid-19 Vaccine Bivalent Booster 101yr & up 11/28/2021  Moderna SARS-COV2 Booster Vaccination 06/13/2020, 03/25/2021, 05/14/2021   Moderna Sars-Covid-2 Vaccination 09/05/2019, 10/03/2019   Pneumococcal Conjugate-13 03/05/2014   Pneumococcal Polysaccharide-23 06/04/2009   Pneumococcal-Unspecified 03/13/2009   Td 03/13/2009, 04/13/2012   Zoster Recombinat (Shingrix) 08/03/2017, 10/01/2017   Zoster, Live 03/21/2010   Pertinent  Health Maintenance Due  Topic Date Due   INFLUENZA VACCINE  03/03/2022   DEXA SCAN  Discontinued      12/07/2012   10:00 AM 01/11/2020    9:49 AM 09/26/2021    1:02 PM  Fall Risk  Falls in the past year? Yes 0 0  Was there an injury with Fall?  0 0  Fall Risk Category Calculator  0 0  Fall Risk Category  Low Low  Patient Fall Risk Level  Low fall risk Low fall risk  Patient at Risk for Falls Due to Impaired balance/gait  Impaired balance/gait  Fall risk Follow up   Falls  evaluation completed   Functional Status Survey:    Vitals:   01/20/22 1201  BP: (!) 118/53  Pulse: 60  Resp: 18  Temp: 98.3 F (36.8 C)  SpO2: 94%  Weight: 159 lb 3.2 oz (72.2 kg)  Height: '5\' 2"'$  (1.575 m)   Body mass index is 29.12 kg/m. Physical Exam Vitals reviewed.  Constitutional:      General: She is not in acute distress. HENT:     Head: Normocephalic.  Eyes:     General:        Right eye: No discharge.        Left eye: No discharge.  Cardiovascular:     Rate and Rhythm: Normal rate and regular rhythm.     Pulses: Normal pulses.     Heart sounds: Normal heart sounds.  Pulmonary:     Effort: Pulmonary effort is normal. No respiratory distress.     Breath sounds: Normal breath sounds. No wheezing or rales.  Abdominal:     General: Bowel sounds are normal. There is distension.     Palpations: Abdomen is soft. There is no mass.     Tenderness: There is no abdominal tenderness. There is no guarding or rebound.     Hernia: No hernia is present.  Musculoskeletal:     Cervical back: Neck supple.     Right lower leg: No edema.     Left lower leg: No edema.  Skin:    General: Skin is warm and dry.     Capillary Refill: Capillary refill takes less than 2 seconds.     Comments: Approx 1-2 cm lesion to LLE, slightly opened, serous drainage, no erythema or swelling, surrounding skin intact.   Neurological:     General: No focal deficit present.     Mental Status: She is alert. Mental status is at baseline.     Motor: Weakness present.     Gait: Gait abnormal.     Comments: wheelchair  Psychiatric:        Mood and Affect: Mood normal.        Behavior: Behavior normal.        Cognition and Memory: Cognition is impaired. Memory is impaired.     Comments: Gave yes/no answers, followed commands, alert to self and familiar faces     Labs reviewed: Recent Labs    07/14/21 0000  NA 144  K 4.2  CL 105  CO2 24*  BUN 31*  CREATININE 1.0  CALCIUM 9.5   No  results for input(s): "AST", "ALT", "ALKPHOS", "BILITOT", "  PROT", "ALBUMIN" in the last 8760 hours. Recent Labs    07/14/21 0000  WBC 6.8  HGB 11.2*  HCT 35*  PLT 236   Lab Results  Component Value Date   TSH 1.52 09/23/2020   No results found for: "HGBA1C" No results found for: "CHOL", "HDL", "LDLCALC", "LDLDIRECT", "TRIG", "CHOLHDL"  Significant Diagnostic Results in last 30 days:  No results found.  Assessment/Plan 1. Dementia of the Alzheimer's type, with late onset, with delusions ([place]) - non ambulatory - occasional delusions - dependent for all ADLs - cont skilled nursing care - cont Namenda and Vistaril prn  2. Skin lesion of left leg - closed, but scab compromised - recommend covering with foam dressing daily until closed/no drainage  3. Severe persistent asthma with exacerbation - followed by Dr. Silas Flood - intermittent cough - lung sounds clear - cont Duplixent, nebs prn, Singulair and Flonase - cont robafen prn  4. Essential hypertension - controlled - cont furosemide - cbc/diff - cmp  5. Gastro-esophageal reflux disease without esophagitis - hgb stable - cont omeprazole  6. Anxiety - no panic attacks - cont Prozac and Vistaril prn  7. Major depressive disorder, single episode, in remission ([place]) - no mood changes - cont Prozac  8. Osteoporosis without current pathological fracture, unspecified osteoporosis type - no further studies due to goals of care - cont calcium and vitamin D    Family/ staff Communication: plan discussed with patient and nurse  Labs/tests ordered:  cbc/diff, cmp 01/22/2022

## 2022-01-22 DIAGNOSIS — I10 Essential (primary) hypertension: Secondary | ICD-10-CM | POA: Diagnosis not present

## 2022-01-22 LAB — BASIC METABOLIC PANEL
BUN: 18 (ref 4–21)
CO2: 24 — AB (ref 13–22)
Chloride: 106 (ref 99–108)
Creatinine: 0.8 (ref 0.5–1.1)
Glucose: 92
Potassium: 4.3 mEq/L (ref 3.5–5.1)
Sodium: 138 (ref 137–147)

## 2022-01-22 LAB — COMPREHENSIVE METABOLIC PANEL
Albumin: 3.3 — AB (ref 3.5–5.0)
Calcium: 9.3 (ref 8.7–10.7)
Globulin: 1.6

## 2022-01-22 LAB — HEPATIC FUNCTION PANEL
ALT: 9 U/L (ref 7–35)
AST: 11 — AB (ref 13–35)
Alkaline Phosphatase: 101 (ref 25–125)
Bilirubin, Total: 0.3

## 2022-01-22 LAB — CBC: RBC: 3.77 — AB (ref 3.87–5.11)

## 2022-01-22 LAB — CBC AND DIFFERENTIAL
HCT: 34 — AB (ref 36–46)
Hemoglobin: 11.4 — AB (ref 12.0–16.0)
Platelets: 218 10*3/uL (ref 150–400)
WBC: 6.4

## 2022-02-17 ENCOUNTER — Non-Acute Institutional Stay (SKILLED_NURSING_FACILITY): Payer: Medicare Other | Admitting: Orthopedic Surgery

## 2022-02-17 ENCOUNTER — Encounter: Payer: Self-pay | Admitting: Orthopedic Surgery

## 2022-02-17 DIAGNOSIS — G301 Alzheimer's disease with late onset: Secondary | ICD-10-CM

## 2022-02-17 DIAGNOSIS — M810 Age-related osteoporosis without current pathological fracture: Secondary | ICD-10-CM

## 2022-02-17 DIAGNOSIS — K219 Gastro-esophageal reflux disease without esophagitis: Secondary | ICD-10-CM | POA: Diagnosis not present

## 2022-02-17 DIAGNOSIS — J4551 Severe persistent asthma with (acute) exacerbation: Secondary | ICD-10-CM | POA: Diagnosis not present

## 2022-02-17 DIAGNOSIS — F325 Major depressive disorder, single episode, in full remission: Secondary | ICD-10-CM | POA: Diagnosis not present

## 2022-02-17 DIAGNOSIS — L989 Disorder of the skin and subcutaneous tissue, unspecified: Secondary | ICD-10-CM | POA: Diagnosis not present

## 2022-02-17 DIAGNOSIS — F419 Anxiety disorder, unspecified: Secondary | ICD-10-CM | POA: Diagnosis not present

## 2022-02-17 DIAGNOSIS — I10 Essential (primary) hypertension: Secondary | ICD-10-CM

## 2022-02-17 DIAGNOSIS — F02818 Dementia in other diseases classified elsewhere, unspecified severity, with other behavioral disturbance: Secondary | ICD-10-CM | POA: Diagnosis not present

## 2022-02-17 DIAGNOSIS — B372 Candidiasis of skin and nail: Secondary | ICD-10-CM | POA: Diagnosis not present

## 2022-02-17 NOTE — Progress Notes (Signed)
Location:  [place] Room Number: 121/A Place of Service:  SNF (31) Provider: Yvonna Alanis, NP  Patient Care Team: Virgie Dad, MD as PCP - General (Internal Medicine) Elsie Stain, MD (Pulmonary Disease)  Extended Emergency Contact Information Primary Emergency Contact: Earlene Plater of [place] Phone: [phone] Mobile Phone: [phone] Relation: Daughter  Code Status:  DNR Goals of care: Advanced Directive information    02/17/2022   10:30 AM  Advanced Directives  Does Patient Have a Medical Advance Directive? Yes  Type of Paramedic of [place];Living will;Out of facility DNR (pink MOST or yellow form)  Does patient want to make changes to medical advance directive? No - Patient declined  Copy of [place] in Chart? Yes - validated most recent copy scanned in chart (See row information)  Pre-existing out of facility DNR order (yellow form or pink MOST form) Yellow form placed in chart (order not valid for inpatient use)     Chief Complaint  Patient presents with   Medical Management of Chronic Issues    Routine visit.    Quality Metric Gaps    Discuss the need for additional Covid booster, or post pone if patient refuses.     HPI:  Pt is a 88 y.o. female seen today for medical management of chronic diseases.    She currently resides on the skilled nursing unit at [name] PMH: HTN, allergic rhinitis, asthma, GERD, Alzheimer's, OA, osteoporosis, CKD 3b, and depression.   Candidal rash- located under breasts, nystatin powder started 07/16 Alzheimer's- CT head 2018 chronic ischemic microvascular disease, non ambulatory, requires assistance for all ADLs, sometimes will have delusions, remains on Namenda and hydroxyzine prn Asthma- followed by Dr. Silas Flood, 06/02 CXR unremarkable, cough intermittent- given robafen prn, remains on Dupixent, formoterol nebs  and duonebs prn, Singulair and Flonase HTN- BUN/creat 18/0.8 01/22/2022, remains in lasix daily GERD- hgb 11.4 01/22/2022, remains in Prilosec Anxiety- no recent panic attacks, remains on vistaril prn Depression-  no recent mood changes, very pleasant today, remains on Prozac, Na+ 138 01/22/2022 Osteoporosis- DEXA studies discontinued due to goals of care, remains on calcium and vitamin D LLE wound- healed at this time  No recent falls or injuries.   Recent blood pressures:  07/17- 129/76  07/16- 148/65  07/14- 131/73  Recent weights:  07/01- 159.2 lbs   06/06- 169.1 lbs  05/01- 160.1 lbs   Past Medical History:  Diagnosis Date   Age-related osteoporosis without current pathological fracture    Alzheimer's disease ([place])    with late onset   Arthritis    Asthma    Deficiency of other specified B group vitamins    Depression    Environmental allergies    mold   Fall    GERD without esophagitis    Hyperlipidemia    Hypertension    Hypertensive kidney disease with CKD (chronic kidney disease)    with stage I through stage 4 CKD    Memory loss    Seasonal allergies    Unspecified osteoarthritis, unspecified site    Past Surgical History:  Procedure Laterality Date   HIP ARTHROPLASTY  06/25/2011   Procedure: ARTHROPLASTY BIPOLAR HIP;  Surgeon: Laurice Record [place];  Location: [facility];  Service: Orthopedics;  Laterality: Right;   NASAL SINUS SURGERY     VAGINAL HYSTERECTOMY      Allergies  Allergen Reactions   Azithromycin Other (See Comments)    Nausea, weakness  Aspirin Other (See Comments)    Unknown reaction   Molds & Smuts    Septra [Sulfamethoxazole-Trimethoprim]    Sulfa Antibiotics    Sulfonamide Derivatives Other (See Comments)    Unknown reaction    Outpatient Encounter Medications as of 02/17/2022  Medication Sig   acetaminophen (TYLENOL) 500 MG tablet Take 500 mg by mouth in the morning, at noon, and at bedtime.    benzonatate (TESSALON) 200 MG  capsule Take 1 capsule (200 mg total) by mouth 3 (three) times daily as needed for cough.   budesonide (PULMICORT) 0.5 MG/2ML nebulizer solution Take 0.5 mg by nebulization 2 (two) times daily.   Calcium Carb-Cholecalciferol (CALCIUM 600-D PO) Take 1 tablet by mouth daily.   Cholecalciferol (VITAMIN D) 2000 units tablet Take 2,000 Units by mouth at bedtime.   dextromethorphan (DELSYM) 30 MG/5ML liquid Take 60 mg by mouth every 12 (twelve) hours as needed for cough.   docusate sodium (COLACE) 100 MG capsule Take 100 mg by mouth at bedtime.    dupilumab (DUPIXENT) 300 MG/2ML prefilled syringe Inject 300 mg into the skin every 14 (fourteen) days.   FLUoxetine (PROZAC) 20 MG capsule Take 40 mg by mouth daily.   fluticasone (FLONASE) 50 MCG/ACT nasal spray Place 2 sprays into both nostrils daily. 8 am - 11 am   formoterol (PERFOROMIST) 20 MCG/2ML nebulizer solution Take 20 mcg by nebulization 2 (two) times daily.   furosemide (LASIX) 20 MG tablet Take 20 mg by mouth daily.   guaiFENesin (MUCINEX FAST-MAX CHEST CONG MS) 100 MG/5ML liquid Take 5 mLs by mouth every 4 (four) hours as needed for cough or to loosen phlegm.   ipratropium-albuterol (DUONEB) 0.5-2.5 (3) MG/3ML SOLN Take 3 mLs by nebulization every 6 (six) hours as needed.   lactose free nutrition (BOOST) LIQD Take 237 mLs by mouth daily.   memantine (NAMENDA) 10 MG tablet Take 1 tablet (10 mg total) by mouth 2 (two) times daily.   montelukast (SINGULAIR) 10 MG tablet Take 1 tablet (10 mg total) by mouth daily.   nystatin powder Apply 1 Application topically 2 (two) times daily. Apply powder under both breast for 7 days if not healed then let the MD know   omeprazole (PRILOSEC) 20 MG capsule Take 20 mg by mouth 2 (two) times daily before a meal.   ondansetron (ZOFRAN) 4 MG tablet Take 4 mg by mouth every 8 (eight) hours as needed for nausea or vomiting.   Polyethyl Glycol-Propyl Glycol (SYSTANE) 0.4-0.3 % GEL ophthalmic gel Place 1 application  into both eyes at bedtime.   polyethylene glycol (MIRALAX / GLYCOLAX) packet Take 17 g by mouth daily as needed.   triamcinolone cream (KENALOG) 0.1 % Apply 1 application topically as needed.   vitamin B-12 (CYANOCOBALAMIN) 1000 MCG tablet Take 3,000 mcg by mouth daily.   [DISCONTINUED] hydrOXYzine (ATARAX) 25 MG tablet Take 25 mg by mouth as needed (agotation and anxiety).   [DISCONTINUED] rivaroxaban (XARELTO) 10 MG TABS tablet Take 1 tablet (10 mg total) by mouth daily.   No facility-administered encounter medications on file as of 02/17/2022.    Review of Systems  Unable to perform ROS: Dementia    Immunization History  Administered Date(s) Administered   Influenza Inj Mdck Quad Pf 05/18/2019, 05/07/2021   Influenza Split 04/04/2012, 05/03/2013, 05/10/2015   Influenza Whole 04/18/2008, 06/04/2009, 04/03/2010, 03/30/2011   Influenza, High Dose Seasonal PF 04/20/2018, 05/24/2020   Influenza,inj,Quad PF,6+ Mos 03/26/2017   Influenza-Unspecified 03/26/2010, 04/13/2012, 05/08/2013, 05/14/2014, 06/03/2014, 05/13/2015, 04/27/2016  Moderna Covid-19 Vaccine Bivalent Booster 5yr & up 11/28/2021   Moderna SARS-COV2 Booster Vaccination 06/13/2020, 03/25/2021, 05/14/2021   Moderna Sars-Covid-2 Vaccination 09/05/2019, 10/03/2019   Pneumococcal Conjugate-13 03/05/2014   Pneumococcal Polysaccharide-23 06/04/2009   Pneumococcal-Unspecified 03/13/2009   Td 03/13/2009, 04/13/2012   Zoster Recombinat (Shingrix) 08/03/2017, 10/01/2017   Zoster, Live 03/21/2010   Pertinent  Health Maintenance Due  Topic Date Due   INFLUENZA VACCINE  03/03/2022   DEXA SCAN  Discontinued      12/07/2012   10:00 AM 01/11/2020    9:49 AM 09/26/2021    1:02 PM  Fall Risk  Falls in the past year? Yes 0 0  Was there an injury with Fall?  0 0  Fall Risk Category Calculator  0 0  Fall Risk Category  Low Low  Patient Fall Risk Level  Low fall risk Low fall risk  Patient at Risk for Falls Due to Impaired  balance/gait  Impaired balance/gait  Fall risk Follow up   Falls evaluation completed   Functional Status Survey:    Vitals:   02/17/22 1018  BP: 129/76  Pulse: 60  Resp: 18  Temp: (!) 97.2 F (36.2 C)  SpO2: 95%  Weight: 159 lb 3.2 oz (72.2 kg)  Height: '5\' 2"'$  (1.575 m)   Body mass index is 29.12 kg/m. Physical Exam Vitals reviewed.  Constitutional:      General: She is not in acute distress. HENT:     Head: Normocephalic.     Right Ear: There is no impacted cerumen.     Left Ear: There is no impacted cerumen.     Nose: Nose normal.     Mouth/Throat:     Mouth: Mucous membranes are moist.  Eyes:     General:        Right eye: No discharge.        Left eye: No discharge.  Cardiovascular:     Rate and Rhythm: Normal rate and regular rhythm.     Pulses: Normal pulses.     Heart sounds: Normal heart sounds.  Pulmonary:     Effort: Pulmonary effort is normal. No respiratory distress.     Breath sounds: Normal breath sounds. No wheezing.  Abdominal:     General: Bowel sounds are normal. There is no distension.     Palpations: Abdomen is soft.     Tenderness: There is no abdominal tenderness.  Musculoskeletal:     Cervical back: Neck supple.     Right lower leg: No edema.     Left lower leg: No edema.  Skin:    General: Skin is warm and dry.     Capillary Refill: Capillary refill takes less than 2 seconds.     Findings: Rash present.     Comments: Excoriated skin under breasts, no skin breakdown  Neurological:     General: No focal deficit present.     Mental Status: She is alert. Mental status is at baseline.     Motor: Weakness present.     Gait: Gait abnormal.     Comments: Broda chair  Psychiatric:        Mood and Affect: Mood normal.        Behavior: Behavior normal.     Comments: Very pleasant, follows commands, alert to self/familiar face     Labs reviewed: Recent Labs    07/14/21 0000 01/22/22 0000  NA 144 138  K 4.2 4.3  CL 105 106  CO2 24*  24*  BUN 31* 18  CREATININE 1.0 0.8  CALCIUM 9.5 9.3   Recent Labs    01/22/22 0000  AST 11*  ALT 9  ALKPHOS 101  ALBUMIN 3.3*   Recent Labs    07/14/21 0000 01/22/22 0000  WBC 6.8 6.4  HGB 11.2* 11.4*  HCT 35* 34*  PLT 236 218   Lab Results  Component Value Date   TSH 1.52 09/23/2020   No results found for: "HGBA1C" No results found for: "CHOL", "HDL", "LDLCALC", "LDLDIRECT", "TRIG", "CHOLHDL"  Significant Diagnostic Results in last 30 days:  No results found.  Assessment/Plan 1. Candidal skin infection - excoriated skin under breasts - cont nystatin powder  2. Dementia of the Alzheimer's type, with late onset, with delusions ([place]) - occasional behaviors in evening - does well with redirection and vistaril - nonambulatory, dependent with ADLs - cont Namenda  3. Severe persistent asthma with exacerbation - followed by Dr. Silas Flood - cont Duplixent, nebs prn, Singulair and Flonase - cont robafen prn   4. Essential hypertension - controlled - cont furosemide  5. Gastro-esophageal reflux disease without esophagitis -hgb stable - cont omeprazole  6. Anxiety - cont Prozac and vistaril prn  7. Major depressive disorder, single episode, in remission ([place]) - no mood changes - cont Prozac  8. Osteoporosis without current pathological fracture, unspecified osteoporosis type - cont calcium/vit D  9. Skin lesion of left leg - resolved    Family/ staff Communication: plan discussed with patient and nurse  Labs/tests ordered:  none

## 2022-03-31 ENCOUNTER — Encounter: Payer: Self-pay | Admitting: Orthopedic Surgery

## 2022-03-31 ENCOUNTER — Non-Acute Institutional Stay (SKILLED_NURSING_FACILITY): Payer: Medicare Other | Admitting: Orthopedic Surgery

## 2022-03-31 DIAGNOSIS — F419 Anxiety disorder, unspecified: Secondary | ICD-10-CM | POA: Diagnosis not present

## 2022-03-31 DIAGNOSIS — I10 Essential (primary) hypertension: Secondary | ICD-10-CM

## 2022-03-31 DIAGNOSIS — M810 Age-related osteoporosis without current pathological fracture: Secondary | ICD-10-CM | POA: Diagnosis not present

## 2022-03-31 DIAGNOSIS — F325 Major depressive disorder, single episode, in full remission: Secondary | ICD-10-CM | POA: Diagnosis not present

## 2022-03-31 DIAGNOSIS — J4551 Severe persistent asthma with (acute) exacerbation: Secondary | ICD-10-CM

## 2022-03-31 DIAGNOSIS — F02818 Dementia in other diseases classified elsewhere, unspecified severity, with other behavioral disturbance: Secondary | ICD-10-CM

## 2022-03-31 DIAGNOSIS — B372 Candidiasis of skin and nail: Secondary | ICD-10-CM

## 2022-03-31 DIAGNOSIS — G301 Alzheimer's disease with late onset: Secondary | ICD-10-CM

## 2022-03-31 DIAGNOSIS — K219 Gastro-esophageal reflux disease without esophagitis: Secondary | ICD-10-CM

## 2022-03-31 NOTE — Progress Notes (Signed)
Location:   [place] Room Number: 121-A Place of Service:  SNF [phone]) Provider:  Windell Moulding, NP  PCP: Virgie Dad, MD  Patient Care Team: Virgie Dad, MD as PCP - General (Internal Medicine) Elsie Stain, MD (Pulmonary Disease)  Extended Emergency Contact Information Primary Emergency Contact: Earlene Plater of [place] Phone: [phone] Mobile Phone: [phone] Relation: Daughter  Code Status: DNR  Goals of care: Advanced Directive information    03/31/2022   10:39 AM  Advanced Directives  Does Patient Have a Medical Advance Directive? Yes  Type of Paramedic of [place];Living will;Out of facility DNR (pink MOST or yellow form)  Does patient want to make changes to medical advance directive? No - Patient declined  Copy of [place] in Chart? Yes - validated most recent copy scanned in chart (See row information)     Chief Complaint  Patient presents with   Medical Management of Chronic Issues    Routine Visit.    Immunizations    Discuss the need for Covid Booster, and Influenza vaccine.    HPI:  Pt is a 88 y.o. female seen today for medical management of chronic diseases.    She currently resides on the skilled nursing unit at [name] PMH: HTN, allergic rhinitis, asthma, GERD, Alzheimer's, OA, osteoporosis, CKD 3b, and depression.   Alzheimer's- CT head 2018 chronic ischemic microvascular disease, non ambulatory, requires assistance for all ADLs, sleeping more per nursing, remains on Namenda and hydroxyzine prn Asthma- followed by Dr. Silas Flood, 06/02 CXR unremarkable, cough intermittent- given robafen prn, remains on Dupixent, formoterol nebs and duonebs prn, Singulair and Flonase HTN- BUN/creat 18/0.8 01/22/2022, remains in lasix daily GERD- hgb 11.4 01/22/2022, remains in Prilosec Depression/Anxiety- no recent mood changes, Na+ 138 01/22/2022,  remains on Prozac and vistaril prn Osteoporosis- DEXA studies discontinued due to goals of care, remains on calcium and vitamin D Candidal rash- located under breasts- almost healed, nystatin powder started 07/16  No recent falls or injuries.   Recent blood pressures:  08/22- 138/70  08/19- 154/78  08/15- 160/85  Recent weights:  08/01- 160.2 lbs  07/01- 159.2 lbs  06/08- 159.2 lbs     Past Medical History:  Diagnosis Date   Age-related osteoporosis without current pathological fracture    Alzheimer's disease ([place])    with late onset   Arthritis    Asthma    Deficiency of other specified B group vitamins    Depression    Environmental allergies    mold   Fall    GERD without esophagitis    Hyperlipidemia    Hypertension    Hypertensive kidney disease with CKD (chronic kidney disease)    with stage I through stage 4 CKD    Memory loss    Seasonal allergies    Unspecified osteoarthritis, unspecified site    Past Surgical History:  Procedure Laterality Date   HIP ARTHROPLASTY  06/25/2011   Procedure: ARTHROPLASTY BIPOLAR HIP;  Surgeon: Laurice Record [place];  Location: [facility];  Service: Orthopedics;  Laterality: Right;   NASAL SINUS SURGERY     VAGINAL HYSTERECTOMY      Allergies  Allergen Reactions   Azithromycin Other (See Comments)    Nausea, weakness    Aspirin Other (See Comments)    Unknown reaction   Erythromycin    Molds & Smuts    Other     Seasonal Allergies.   Septra [Sulfamethoxazole-Trimethoprim]  Sulfa Antibiotics    Sulfonamide Derivatives Other (See Comments)    Unknown reaction    Allergies as of 03/31/2022       Reactions   Azithromycin Other (See Comments)   Nausea, weakness    Aspirin Other (See Comments)   Unknown reaction   Erythromycin    Molds & Smuts    Other    Seasonal Allergies.   Septra [sulfamethoxazole-trimethoprim]    Sulfa Antibiotics    Sulfonamide Derivatives Other (See Comments)   Unknown reaction         Medication List        Accurate as of March 31, 2022 10:39 AM. If you have any questions, ask your nurse or doctor.          STOP taking these medications    guaiFENesin 100 MG/5ML liquid Commonly known as: Mucinex Fast-Max Chest Cong MS Stopped by: Yvonna Alanis, NP   ondansetron 4 MG tablet Commonly known as: ZOFRAN Stopped by: Yvonna Alanis, NP   triamcinolone cream 0.1 % Commonly known as: KENALOG Stopped by: Yvonna Alanis, NP       TAKE these medications    acetaminophen 500 MG tablet Commonly known as: TYLENOL Take 500 mg by mouth in the morning, at noon, and at bedtime.   benzonatate 200 MG capsule Commonly known as: TESSALON Take 1 capsule (200 mg total) by mouth 3 (three) times daily as needed for cough.   budesonide 0.5 MG/2ML nebulizer solution Commonly known as: PULMICORT Take 0.5 mg by nebulization 2 (two) times daily.   CALCIUM 600-D PO Take 1 tablet by mouth daily.   cyanocobalamin 1000 MCG tablet Commonly known as: VITAMIN B12 Take 3,000 mcg by mouth daily.   dextromethorphan 30 MG/5ML liquid Commonly known as: DELSYM Take 60 mg by mouth every 12 (twelve) hours as needed for cough.   docusate sodium 100 MG capsule Commonly known as: COLACE Take 100 mg by mouth at bedtime.   Dupixent 300 MG/2ML prefilled syringe Generic drug: dupilumab Inject 300 mg into the skin every 14 (fourteen) days.   FLUoxetine 20 MG capsule Commonly known as: PROZAC Take 40 mg by mouth every morning.   fluticasone 50 MCG/ACT nasal spray Commonly known as: FLONASE Place 2 sprays into both nostrils daily. 8 am - 11 am   furosemide 20 MG tablet Commonly known as: LASIX Take 20 mg by mouth daily.   ipratropium-albuterol 0.5-2.5 (3) MG/3ML Soln Commonly known as: DUONEB Take 3 mLs by nebulization every 6 (six) hours as needed.   lactose free nutrition Liqd Take 237 mLs by mouth as needed.   memantine 10 MG tablet Commonly known as: Namenda Take 1 tablet  (10 mg total) by mouth 2 (two) times daily.   montelukast 10 MG tablet Commonly known as: SINGULAIR Take 1 tablet (10 mg total) by mouth daily.   nystatin powder Generic drug: nystatin Apply 1 Application topically 2 (two) times daily. Apply powder under both breast for 7 days if not healed then let the MD know   omeprazole 20 MG capsule Commonly known as: PRILOSEC Take 20 mg by mouth daily. 30 minutes before breakfast.   omeprazole 20 MG capsule Commonly known as: PRILOSEC Take 20 mg by mouth every evening. 30 minutes before dinner.   Perforomist 20 MCG/2ML nebulizer solution Generic drug: formoterol Take 20 mcg by nebulization 2 (two) times daily.   polyethylene glycol 17 g packet Commonly known as: MIRALAX / GLYCOLAX Take 17 g by mouth  daily as needed.   Systane 0.4-0.3 % Gel ophthalmic gel Generic drug: Polyethyl Glycol-Propyl Glycol Place 1 application into both eyes at bedtime.   Vitamin D 50 MCG (2000 UT) tablet Take 2,000 Units by mouth at bedtime.        Review of Systems  Unable to perform ROS: Dementia    Immunization History  Administered Date(s) Administered   Influenza Inj Mdck Quad Pf 05/18/2019, 05/07/2021   Influenza Split 04/04/2012, 05/03/2013, 05/10/2015   Influenza Whole 04/18/2008, 06/04/2009, 04/03/2010, 03/30/2011   Influenza, High Dose Seasonal PF 04/20/2018, 05/24/2020   Influenza,inj,Quad PF,6+ Mos 03/26/2017   Influenza-Unspecified 03/26/2010, 04/13/2012, 05/08/2013, 05/14/2014, 06/03/2014, 05/13/2015, 04/27/2016   Moderna Covid-19 Vaccine Bivalent Booster 54yr & up 11/28/2021   Moderna SARS-COV2 Booster Vaccination 06/13/2020, 03/25/2021, 05/14/2021   Moderna Sars-Covid-2 Vaccination 09/05/2019, 10/03/2019   Pneumococcal Conjugate-13 03/05/2014   Pneumococcal Polysaccharide-23 06/04/2009   Pneumococcal-Unspecified 03/13/2009   Td 03/13/2009, 04/13/2012   Zoster Recombinat (Shingrix) 08/03/2017, 10/01/2017   Zoster, Live 03/21/2010    Pertinent  Health Maintenance Due  Topic Date Due   INFLUENZA VACCINE  03/03/2022   DEXA SCAN  Discontinued      12/07/2012   10:00 AM 01/11/2020    9:49 AM 09/26/2021    1:02 PM  Fall Risk  Falls in the past year? Yes 0 0  Was there an injury with Fall?  0 0  Fall Risk Category Calculator  0 0  Fall Risk Category  Low Low  Patient Fall Risk Level  Low fall risk Low fall risk  Patient at Risk for Falls Due to Impaired balance/gait  Impaired balance/gait  Fall risk Follow up   Falls evaluation completed   Functional Status Survey:    Vitals:   03/31/22 1029  BP: 138/70  Pulse: 68  Resp: 20  Temp: (!) 97.2 F (36.2 C)  SpO2: 97%  Weight: 160 lb 3.2 oz (72.7 kg)  Height: '5\' 2"'$  (1.575 m)   Body mass index is 29.3 kg/m. Physical Exam Vitals reviewed.  Constitutional:      General: She is not in acute distress. HENT:     Head: Normocephalic.     Right Ear: There is no impacted cerumen.     Left Ear: There is no impacted cerumen.     Ears:     Comments: Bilateral hearing aids    Nose: Nose normal.     Mouth/Throat:     Mouth: Mucous membranes are moist.  Eyes:     General:        Right eye: No discharge.        Left eye: No discharge.  Cardiovascular:     Rate and Rhythm: Normal rate and regular rhythm.     Pulses: Normal pulses.     Heart sounds: Normal heart sounds.  Pulmonary:     Effort: Pulmonary effort is normal. No respiratory distress.     Breath sounds: No wheezing or rales.  Abdominal:     General: Bowel sounds are normal. There is no distension.     Palpations: Abdomen is soft.     Tenderness: There is no abdominal tenderness.  Musculoskeletal:     Cervical back: Neck supple.     Right lower leg: Edema present.     Left lower leg: Edema present.     Comments: Non pitting  Skin:    General: Skin is warm and dry.     Capillary Refill: Capillary refill takes less than 2 seconds.  Comments: Candidal rash under breasts almost healed, skin with  mild erythema  Neurological:     General: No focal deficit present.     Mental Status: She is alert. Mental status is at baseline.     Motor: Weakness present.     Gait: Gait abnormal.     Comments: [place]oyer,wheelchair  Psychiatric:        Cognition and Memory: Cognition is impaired. Memory is impaired.     Labs reviewed: Recent Labs    07/14/21 0000 01/22/22 0000  NA 144 138  K 4.2 4.3  CL 105 106  CO2 24* 24*  BUN 31* 18  CREATININE 1.0 0.8  CALCIUM 9.5 9.3   Recent Labs    01/22/22 0000  AST 11*  ALT 9  ALKPHOS 101  ALBUMIN 3.3*   Recent Labs    07/14/21 0000 01/22/22 0000  WBC 6.8 6.4  HGB 11.2* 11.4*  HCT 35* 34*  PLT 236 218   Lab Results  Component Value Date   TSH 1.52 09/23/2020   No results found for: "HGBA1C" No results found for: "CHOL", "HDL", "LDLCALC", "LDLDIRECT", "TRIG", "CHOLHDL"  Significant Diagnostic Results in last 30 days:  No results found.  Assessment/Plan 1. Dementia of the Alzheimer's type, with late onset, with delusions ([place]) - intermittent delusions - non ambulatory - dependent with all ADLs - cont Namenda and hydroxyzine prn  2. Severe persistent asthma with exacerbation - followed by Dr. Silas Flood  - cont Duplixent, nebs prn, robafen prn, Singulair and Flonase  3. Essential hypertension - contreolled - cont furosemide  4. Gastro-esophageal reflux disease without esophagitis - hgb stable - cont omeprazole  5. Major depressive disorder, single episode, in remission ([place]) - no mood changes - Na+ stable - cont Prozac  6. Anxiety - associated with dementia/delusions - cont vistaril prn  7. Osteoporosis without current pathological fracture, unspecified osteoporosis type - no further studies per goals of care - cont calcium/vit D  8. Candidal skin infection - almost healed - cont nystatin power    Family/ staff Communication: plan discussed with patient and nurse  Labs/tests ordered: none

## 2022-04-27 ENCOUNTER — Encounter: Payer: Self-pay | Admitting: Internal Medicine

## 2022-04-27 ENCOUNTER — Non-Acute Institutional Stay (SKILLED_NURSING_FACILITY): Payer: Medicare Other | Admitting: Internal Medicine

## 2022-04-27 DIAGNOSIS — F325 Major depressive disorder, single episode, in full remission: Secondary | ICD-10-CM | POA: Diagnosis not present

## 2022-04-27 DIAGNOSIS — I10 Essential (primary) hypertension: Secondary | ICD-10-CM | POA: Diagnosis not present

## 2022-04-27 DIAGNOSIS — N1832 Chronic kidney disease, stage 3b: Secondary | ICD-10-CM | POA: Diagnosis not present

## 2022-04-27 DIAGNOSIS — K219 Gastro-esophageal reflux disease without esophagitis: Secondary | ICD-10-CM

## 2022-04-27 DIAGNOSIS — J4551 Severe persistent asthma with (acute) exacerbation: Secondary | ICD-10-CM

## 2022-04-27 DIAGNOSIS — G301 Alzheimer's disease with late onset: Secondary | ICD-10-CM | POA: Diagnosis not present

## 2022-04-27 DIAGNOSIS — F02818 Dementia in other diseases classified elsewhere, unspecified severity, with other behavioral disturbance: Secondary | ICD-10-CM | POA: Diagnosis not present

## 2022-04-27 NOTE — Progress Notes (Unsigned)
Location:  [facility] Room Number: SNF 121A Place of Service:  SNF [phone]) Provider:  Dr. Clydene Fake, MD  Patient Care Team: Virgie Dad, MD as PCP - General (Internal Medicine) Elsie Stain, MD (Pulmonary Disease)  Extended Emergency Contact Information Primary Emergency Contact: Earlene Plater of [place] Phone: [phone] Mobile Phone: [phone] Relation: Daughter  Code Status:  DNR Goals of care: Advanced Directive information    03/31/2022   10:39 AM  Advanced Directives  Does Patient Have a Medical Advance Directive? Yes  Type of Paramedic of [place];Living will;Out of facility DNR (pink MOST or yellow form)  Does patient want to make changes to medical advance directive? No - Patient declined  Copy of [place] in Chart? Yes - validated most recent copy scanned in chart (See row information)     Chief Complaint  Patient presents with   Medical Management of Chronic Issues    Medical Management of Chronic Issues.     HPI:  Pt is a 88 y.o. female seen today for medical management of chronic diseases.    Lives in SNF   Patient has a history of Alzheimer's dementia dependent for her ADLs Osteoporosis and knee osteoarthritis CKD stage 3b Severe asthma Eosinophilic HLD and hypertension    She is stable. No new Nursing issues. No Behavior issues Her weight is stable She uses Stand up Lift  Needs help with Feeding No Falls Wt Readings from Last 3 Encounters:  04/27/22 159 lb 12.8 oz (72.5 kg)  03/31/22 160 lb 3.2 oz (72.7 kg)  02/17/22 159 lb 3.2 oz (72.2 kg)     Past Medical History:  Diagnosis Date   Age-related osteoporosis without current pathological fracture    Alzheimer's disease ([place])    with late onset   Arthritis    Asthma    Deficiency of other specified B group vitamins    Depression    Environmental  allergies    mold   Fall    GERD without esophagitis    Hyperlipidemia    Hypertension    Hypertensive kidney disease with CKD (chronic kidney disease)    with stage I through stage 4 CKD    Memory loss    Seasonal allergies    Unspecified osteoarthritis, unspecified site    Past Surgical History:  Procedure Laterality Date   HIP ARTHROPLASTY  06/25/2011   Procedure: ARTHROPLASTY BIPOLAR HIP;  Surgeon: Laurice Record [place];  Location: [facility];  Service: Orthopedics;  Laterality: Right;   NASAL SINUS SURGERY     VAGINAL HYSTERECTOMY      Allergies  Allergen Reactions   Azithromycin Other (See Comments)    Nausea, weakness    Aspirin Other (See Comments)    Unknown reaction   Erythromycin    Molds & Smuts    Other     Seasonal Allergies.   Septra [Sulfamethoxazole-Trimethoprim]    Sulfa Antibiotics    Sulfonamide Derivatives Other (See Comments)    Unknown reaction    Outpatient Encounter Medications as of 04/27/2022  Medication Sig   acetaminophen (TYLENOL) 500 MG tablet Take 500 mg by mouth in the morning, at noon, and at bedtime.    benzonatate (TESSALON) 200 MG capsule Take 1 capsule (200 mg total) by mouth 3 (three) times daily as needed for cough.   budesonide (PULMICORT) 0.5 MG/2ML nebulizer solution Take 0.5 mg by nebulization 2 (two) times daily.  Calcium Carb-Cholecalciferol (CALCIUM 600-D PO) Take 1 tablet by mouth daily.   Cholecalciferol (VITAMIN D) 2000 units tablet Take 2,000 Units by mouth at bedtime.   dextromethorphan (DELSYM) 30 MG/5ML liquid Take 60 mg by mouth every 12 (twelve) hours as needed for cough.   docusate sodium (COLACE) 100 MG capsule Take 100 mg by mouth at bedtime.    dupilumab (DUPIXENT) 300 MG/2ML prefilled syringe Inject 300 mg into the skin every 14 (fourteen) days.   FLUoxetine (PROZAC) 20 MG capsule Take 40 mg by mouth every morning.   fluticasone (FLONASE) 50 MCG/ACT nasal spray Place 2 sprays into both nostrils daily. 8 am - 11 am    formoterol (PERFOROMIST) 20 MCG/2ML nebulizer solution Take 20 mcg by nebulization 2 (two) times daily.   furosemide (LASIX) 20 MG tablet Take 20 mg by mouth daily.   ipratropium-albuterol (DUONEB) 0.5-2.5 (3) MG/3ML SOLN Take 3 mLs by nebulization every 6 (six) hours as needed.   lactose free nutrition (BOOST) LIQD Take 237 mLs by mouth as needed.   memantine (NAMENDA) 10 MG tablet Take 1 tablet (10 mg total) by mouth 2 (two) times daily.   montelukast (SINGULAIR) 10 MG tablet Take 1 tablet (10 mg total) by mouth daily.   nystatin powder Apply 1 Application topically 2 (two) times daily. Apply powder under both breast for 7 days if not healed then let the MD know   omeprazole (PRILOSEC) 20 MG capsule Take 20 mg by mouth daily. 30 minutes before breakfast.   omeprazole (PRILOSEC) 20 MG capsule Take 20 mg by mouth every evening. 30 minutes before dinner.   Polyethyl Glycol-Propyl Glycol (SYSTANE) 0.4-0.3 % GEL ophthalmic gel Place 1 application into both eyes at bedtime.   polyethylene glycol (MIRALAX / GLYCOLAX) packet Take 17 g by mouth daily as needed.   vitamin B-12 (CYANOCOBALAMIN) 1000 MCG tablet Take 3,000 mcg by mouth daily.   [DISCONTINUED] rivaroxaban (XARELTO) 10 MG TABS tablet Take 1 tablet (10 mg total) by mouth daily.   No facility-administered encounter medications on file as of 04/27/2022.    Review of Systems  Unable to perform ROS: Dementia    Immunization History  Administered Date(s) Administered   Influenza Inj Mdck Quad Pf 05/18/2019, 05/07/2021   Influenza Split 04/04/2012, 05/03/2013, 05/10/2015   Influenza Whole 04/18/2008, 06/04/2009, 04/03/2010, 03/30/2011   Influenza, High Dose Seasonal PF 04/20/2018, 05/24/2020   Influenza,inj,Quad PF,6+ Mos 03/26/2017   Influenza-Unspecified 03/26/2010, 04/13/2012, 05/08/2013, 05/14/2014, 06/03/2014, 05/13/2015, 04/27/2016   Moderna Covid-19 Vaccine Bivalent Booster 55yr & up 11/28/2021   Moderna SARS-COV2 Booster  Vaccination 06/13/2020, 03/25/2021, 05/14/2021   Moderna Sars-Covid-2 Vaccination 09/05/2019, 10/03/2019   Pneumococcal Conjugate-13 03/05/2014   Pneumococcal Polysaccharide-23 06/04/2009   Pneumococcal-Unspecified 03/13/2009   Td 03/13/2009, 04/13/2012   Zoster Recombinat (Shingrix) 08/03/2017, 10/01/2017   Zoster, Live 03/21/2010   Pertinent  Health Maintenance Due  Topic Date Due   INFLUENZA VACCINE  03/03/2022   DEXA SCAN  Discontinued      12/07/2012   10:00 AM 01/11/2020    9:49 AM 09/26/2021    1:02 PM  Fall Risk  Falls in the past year? Yes 0 0  Was there an injury with Fall?  0 0  Fall Risk Category Calculator  0 0  Fall Risk Category  Low Low  Patient Fall Risk Level  Low fall risk Low fall risk  Patient at Risk for Falls Due to Impaired balance/gait  Impaired balance/gait  Fall risk Follow up   Falls evaluation completed  Functional Status Survey:    Vitals:   04/27/22 1044  BP: (!) 156/80  Pulse: 68  Resp: 19  Temp: (!) 97 F (36.1 C)  SpO2: 97%  Weight: 159 lb 12.8 oz (72.5 kg)  Height: '5\' 2"'$  (1.575 m)   Body mass index is 29.23 kg/m. Physical Exam Vitals reviewed.  Constitutional:      Appearance: Normal appearance.  HENT:     Head: Normocephalic.     Nose: Nose normal.     Mouth/Throat:     Mouth: Mucous membranes are moist.     Pharynx: Oropharynx is clear.  Eyes:     Pupils: Pupils are equal, round, and reactive to light.  Cardiovascular:     Rate and Rhythm: Normal rate and regular rhythm.     Pulses: Normal pulses.     Heart sounds: Normal heart sounds. No murmur heard. Pulmonary:     Effort: Pulmonary effort is normal.     Breath sounds: Normal breath sounds.  Abdominal:     General: Abdomen is flat. Bowel sounds are normal.     Palpations: Abdomen is soft.  Musculoskeletal:        General: Swelling present.     Cervical back: Neck supple.  Skin:    General: Skin is warm.  Neurological:     General: No focal deficit present.      Mental Status: She is alert.  Psychiatric:        Mood and Affect: Mood normal.        Thought Content: Thought content normal.     Labs reviewed: Recent Labs    07/14/21 0000 01/22/22 0000  NA 144 138  K 4.2 4.3  CL 105 106  CO2 24* 24*  BUN 31* 18  CREATININE 1.0 0.8  CALCIUM 9.5 9.3   Recent Labs    01/22/22 0000  AST 11*  ALT 9  ALKPHOS 101  ALBUMIN 3.3*   Recent Labs    07/14/21 0000 01/22/22 0000  WBC 6.8 6.4  HGB 11.2* 11.4*  HCT 35* 34*  PLT 236 218   Lab Results  Component Value Date   TSH 1.52 09/23/2020   No results found for: "HGBA1C" No results found for: "CHOL", "HDL", "LDLCALC", "LDLDIRECT", "TRIG", "CHOLHDL"  Significant Diagnostic Results in last 30 days:  No results found.  Assessment/Plan 1. Severe persistent asthma with exacerbation Dupixent Flonase and Performist Doing well Follows with Pulmonary  2. Essential hypertension Low dose lasix  3. Gastro-esophageal reflux disease without esophagitis High Dose of Prilosec due to her Asthma 4. Major depressive disorder, single episode, in remission [facility]) Doing well on Prozac  5. Stage 3b chronic kidney disease (HCC) Creat stable  6. Dementia of the Alzheimer's type, with late onset, with delusions ([place]) On Namenda Full care in SNF    Family/ staff Communication:   Labs/tests ordered:

## 2022-05-08 DIAGNOSIS — Z23 Encounter for immunization: Secondary | ICD-10-CM | POA: Diagnosis not present

## 2022-05-22 ENCOUNTER — Encounter: Payer: Self-pay | Admitting: Adult Health

## 2022-05-22 ENCOUNTER — Non-Acute Institutional Stay (SKILLED_NURSING_FACILITY): Payer: Medicare Other | Admitting: Adult Health

## 2022-05-22 DIAGNOSIS — G301 Alzheimer's disease with late onset: Secondary | ICD-10-CM | POA: Diagnosis not present

## 2022-05-22 DIAGNOSIS — K5901 Slow transit constipation: Secondary | ICD-10-CM | POA: Diagnosis not present

## 2022-05-22 DIAGNOSIS — N1832 Chronic kidney disease, stage 3b: Secondary | ICD-10-CM

## 2022-05-22 DIAGNOSIS — J4550 Severe persistent asthma, uncomplicated: Secondary | ICD-10-CM

## 2022-05-22 DIAGNOSIS — F02818 Dementia in other diseases classified elsewhere, unspecified severity, with other behavioral disturbance: Secondary | ICD-10-CM

## 2022-05-22 DIAGNOSIS — K219 Gastro-esophageal reflux disease without esophagitis: Secondary | ICD-10-CM | POA: Diagnosis not present

## 2022-05-22 DIAGNOSIS — I10 Essential (primary) hypertension: Secondary | ICD-10-CM | POA: Diagnosis not present

## 2022-05-22 NOTE — Progress Notes (Signed)
Location:    [facility] of Service:  SNF (31) Provider:  Royal Hawthorn, NP  Virgie Dad, MD  Patient Care Team: Virgie Dad, MD as PCP - General (Internal Medicine) Elsie Stain, MD (Pulmonary Disease)  Extended Emergency Contact Information Primary Emergency Contact: Earlene Plater of [place] Phone: [phone] Mobile Phone: [phone] Relation: Daughter  Code Status:  DNR Goals of care: Advanced Directive information    03/31/2022   10:39 AM  Advanced Directives  Does Patient Have a Medical Advance Directive? Yes  Type of Paramedic of [place];Living will;Out of facility DNR (pink MOST or yellow form)  Does patient want to make changes to medical advance directive? No - Patient declined  Copy of [place] in Chart? Yes - validated most recent copy scanned in chart (See row information)     Chief Complaint  Patient presents with   Medical Management of Chronic Issues    HPI:  Pt is a 88 y.o. female seen today for medical management of chronic diseases.   PMH significant for asthma, OP, arthritis, Vit d deficiency, depression, dementia, HTN, HLD, hyperlipidemia, seasonal allergies, OA, and GERD.  Continues to be sleepy in the morning, more alert in the afternoon. Can have increased confusion and agitation in the afternoon. Takes vistaril which helps.  Weight remains stable.  Wt Readings from Last 3 Encounters:  05/22/22 160 lb (72.6 kg)  04/27/22 159 lb 12.8 oz (72.5 kg)  03/31/22 160 lb 3.2 oz (72.7 kg)    BPs reviewed in matrix Blood Pressure: 146 / 81 mmHg  Blood Pressure: 124 / 70 mmHg   Blood Pressure: 120 / 56 mmHg  Blood Pressure: 121 / 55 mmHg  Continues on dupixent for asthma. No recent exacerbation. No cough or wheeze. Not requiring oxygen. Past Medical History:  Diagnosis Date   Age-related osteoporosis without current pathological  fracture    Alzheimer's disease ([place])    with late onset   Arthritis    Asthma    Deficiency of other specified B group vitamins    Depression    Environmental allergies    mold   Fall    GERD without esophagitis    Hyperlipidemia    Hypertension    Hypertensive kidney disease with CKD (chronic kidney disease)    with stage I through stage 4 CKD    Memory loss    Seasonal allergies    Unspecified osteoarthritis, unspecified site    Past Surgical History:  Procedure Laterality Date   HIP ARTHROPLASTY  06/25/2011   Procedure: ARTHROPLASTY BIPOLAR HIP;  Surgeon: Laurice Record [place];  Location: [facility];  Service: Orthopedics;  Laterality: Right;   NASAL SINUS SURGERY     VAGINAL HYSTERECTOMY      Allergies  Allergen Reactions   Azithromycin Other (See Comments)    Nausea, weakness    Aspirin Other (See Comments)    Unknown reaction   Erythromycin    Molds & Smuts    Other     Seasonal Allergies.   Septra [Sulfamethoxazole-Trimethoprim]    Sulfa Antibiotics    Sulfonamide Derivatives Other (See Comments)    Unknown reaction    Allergies as of 05/22/2022       Reactions   Azithromycin Other (See Comments)   Nausea, weakness    Aspirin Other (See Comments)   Unknown reaction   Erythromycin    Molds & Smuts    Other  Seasonal Allergies.   Septra [sulfamethoxazole-trimethoprim]    Sulfa Antibiotics    Sulfonamide Derivatives Other (See Comments)   Unknown reaction        Medication List        Accurate as of May 22, 2022  1:36 PM. If you have any questions, ask your nurse or doctor.          acetaminophen 500 MG tablet Commonly known as: TYLENOL Take 500 mg by mouth in the morning, at noon, and at bedtime.   benzonatate 200 MG capsule Commonly known as: TESSALON Take 1 capsule (200 mg total) by mouth 3 (three) times daily as needed for cough.   budesonide 0.5 MG/2ML nebulizer solution Commonly known as: PULMICORT Take 0.5 mg by nebulization  2 (two) times daily.   CALCIUM 600-D PO Take 1 tablet by mouth daily.   cyanocobalamin 1000 MCG tablet Commonly known as: VITAMIN B12 Take 3,000 mcg by mouth daily.   dextromethorphan 30 MG/5ML liquid Commonly known as: DELSYM Take 60 mg by mouth every 12 (twelve) hours as needed for cough.   docusate sodium 100 MG capsule Commonly known as: COLACE Take 100 mg by mouth at bedtime.   Dupixent 300 MG/2ML prefilled syringe Generic drug: dupilumab Inject 300 mg into the skin every 14 (fourteen) days.   FLUoxetine 20 MG capsule Commonly known as: PROZAC Take 40 mg by mouth every morning.   fluticasone 50 MCG/ACT nasal spray Commonly known as: FLONASE Place 2 sprays into both nostrils daily. 8 am - 11 am   furosemide 20 MG tablet Commonly known as: LASIX Take 20 mg by mouth daily.   ipratropium-albuterol 0.5-2.5 (3) MG/3ML Soln Commonly known as: DUONEB Take 3 mLs by nebulization every 6 (six) hours as needed.   lactose free nutrition Liqd Take 237 mLs by mouth as needed.   memantine 10 MG tablet Commonly known as: Namenda Take 1 tablet (10 mg total) by mouth 2 (two) times daily.   montelukast 10 MG tablet Commonly known as: SINGULAIR Take 1 tablet (10 mg total) by mouth daily.   nystatin powder Generic drug: nystatin Apply 1 Application topically 2 (two) times daily. Apply powder under both breast for 7 days if not healed then let the MD know   omeprazole 20 MG capsule Commonly known as: PRILOSEC Take 20 mg by mouth daily. 30 minutes before breakfast.   omeprazole 20 MG capsule Commonly known as: PRILOSEC Take 20 mg by mouth every evening. 30 minutes before dinner.   Perforomist 20 MCG/2ML nebulizer solution Generic drug: formoterol Take 20 mcg by nebulization 2 (two) times daily.   polyethylene glycol 17 g packet Commonly known as: MIRALAX / GLYCOLAX Take 17 g by mouth daily as needed.   Systane 0.4-0.3 % Gel ophthalmic gel Generic drug: Polyethyl  Glycol-Propyl Glycol Place 1 application into both eyes at bedtime.   Vitamin D 50 MCG (2000 UT) tablet Take 2,000 Units by mouth at bedtime.        Review of Systems  Unable to perform ROS: Dementia    Immunization History  Administered Date(s) Administered   Influenza Inj Mdck Quad Pf 05/18/2019, 05/07/2021   Influenza Split 04/04/2012, 05/03/2013, 05/10/2015   Influenza Whole 04/18/2008, 06/04/2009, 04/03/2010, 03/30/2011   Influenza, High Dose Seasonal PF 04/20/2018, 05/24/2020   Influenza,inj,Quad PF,6+ Mos 03/26/2017   Influenza-Unspecified 03/26/2010, 04/13/2012, 05/08/2013, 05/14/2014, 06/03/2014, 05/13/2015, 04/27/2016, 05/08/2022   Moderna Covid-19 Vaccine Bivalent Booster 25yr & up 11/28/2021   Moderna SARS-COV2 Booster Vaccination 06/13/2020, 03/25/2021,  05/14/2021   Moderna Sars-Covid-2 Vaccination 09/05/2019, 10/03/2019   Pneumococcal Conjugate-13 03/05/2014   Pneumococcal Polysaccharide-23 06/04/2009   Pneumococcal-Unspecified 03/13/2009   Td 03/13/2009, 04/13/2012   Zoster Recombinat (Shingrix) 08/03/2017, 10/01/2017   Zoster, Live 03/21/2010   Pertinent  Health Maintenance Due  Topic Date Due   INFLUENZA VACCINE  Completed   DEXA SCAN  Discontinued      12/07/2012   10:00 AM 01/11/2020    9:49 AM 09/26/2021    1:02 PM  Fall Risk  Falls in the past year? Yes 0 0  Was there an injury with Fall?  0 0  Fall Risk Category Calculator  0 0  Fall Risk Category  Low Low  Patient Fall Risk Level  Low fall risk Low fall risk  Patient at Risk for Falls Due to Impaired balance/gait  Impaired balance/gait  Fall risk Follow up   Falls evaluation completed   Functional Status Survey:    Vitals:   05/22/22 1334  Weight: 160 lb (72.6 kg)   Body mass index is 29.26 kg/m. Physical Exam Vitals and nursing note reviewed.  Constitutional:      General: She is not in acute distress.    Appearance: She is not diaphoretic.  HENT:     Head: Normocephalic and  atraumatic.  Neck:     Vascular: No JVD.  Cardiovascular:     Rate and Rhythm: Normal rate and regular rhythm.     Heart sounds: No murmur heard. Pulmonary:     Effort: Pulmonary effort is normal. No respiratory distress.     Breath sounds: Normal breath sounds. No wheezing.  Abdominal:     General: There is distension (rotund, mild).  Musculoskeletal:     Right lower leg: No edema.     Left lower leg: No edema.  Skin:    General: Skin is warm and dry.  Neurological:     General: No focal deficit present.     Mental Status: She is alert. Mental status is at baseline.  Psychiatric:        Mood and Affect: Mood normal.     Labs reviewed: Recent Labs    07/14/21 0000 01/22/22 0000  NA 144 138  K 4.2 4.3  CL 105 106  CO2 24* 24*  BUN 31* 18  CREATININE 1.0 0.8  CALCIUM 9.5 9.3    Recent Labs    01/22/22 0000  AST 11*  ALT 9  ALKPHOS 101  ALBUMIN 3.3*   Recent Labs    07/14/21 0000 01/22/22 0000  WBC 6.8 6.4  HGB 11.2* 11.4*  HCT 35* 34*  PLT 236 218    Lab Results  Component Value Date   TSH 1.52 09/23/2020   No results found for: "HGBA1C" No results found for: "CHOL", "HDL", "LDLCALC", "LDLDIRECT", "TRIG", "CHOLHDL"  Significant Diagnostic Results in last 30 days:  No results found.  Assessment/Plan  1. Dementia of the Alzheimer's type, with late onset, with delusions ([place]) Progressive decline in cognition and physical function c/w the disease. Continue supportive care in the skilled environment. Recommend continuing Vistaril as needed for agitation  2. Gastro-esophageal reflux disease without esophagitis Continues on prilosec higher dose due to hx of asthma. No current complaints.   3. Severe persistent asthma without complication Followed by pulmonary On Dupixent, Prilsoec, and Perforomist  4. Essential hypertension Controlled GOal <150/90  5. Stage 3b chronic kidney disease [facility]) Lab Results  Component Value Date   BUN 18  01/22/2022  Lab Results  Component Value Date   CREATININE 0.8 01/22/2022    Continue to periodically monitor BMP and avoid nephrotoxic agents   6. Slow transit constipation Continue miralax and colace

## 2022-06-08 DIAGNOSIS — Z23 Encounter for immunization: Secondary | ICD-10-CM | POA: Diagnosis not present

## 2022-06-16 ENCOUNTER — Encounter: Payer: Self-pay | Admitting: Orthopedic Surgery

## 2022-06-16 ENCOUNTER — Non-Acute Institutional Stay (SKILLED_NURSING_FACILITY): Payer: Medicare Other | Admitting: Orthopedic Surgery

## 2022-06-16 DIAGNOSIS — B372 Candidiasis of skin and nail: Secondary | ICD-10-CM | POA: Diagnosis not present

## 2022-06-16 DIAGNOSIS — N1832 Chronic kidney disease, stage 3b: Secondary | ICD-10-CM

## 2022-06-16 DIAGNOSIS — I10 Essential (primary) hypertension: Secondary | ICD-10-CM

## 2022-06-16 DIAGNOSIS — G301 Alzheimer's disease with late onset: Secondary | ICD-10-CM | POA: Diagnosis not present

## 2022-06-16 DIAGNOSIS — K219 Gastro-esophageal reflux disease without esophagitis: Secondary | ICD-10-CM

## 2022-06-16 DIAGNOSIS — F419 Anxiety disorder, unspecified: Secondary | ICD-10-CM | POA: Diagnosis not present

## 2022-06-16 DIAGNOSIS — F02818 Dementia in other diseases classified elsewhere, unspecified severity, with other behavioral disturbance: Secondary | ICD-10-CM | POA: Diagnosis not present

## 2022-06-16 DIAGNOSIS — J4550 Severe persistent asthma, uncomplicated: Secondary | ICD-10-CM | POA: Diagnosis not present

## 2022-06-16 DIAGNOSIS — M810 Age-related osteoporosis without current pathological fracture: Secondary | ICD-10-CM | POA: Diagnosis not present

## 2022-06-16 DIAGNOSIS — F325 Major depressive disorder, single episode, in full remission: Secondary | ICD-10-CM

## 2022-06-16 NOTE — Progress Notes (Signed)
Location:  [facility] Room Number: 027O Place of Service:  SNF [phone]) Provider: Windell Moulding, NP   Patient Care Team: Virgie Dad, MD as PCP - General (Internal Medicine) Elsie Stain, MD (Pulmonary Disease)  Extended Emergency Contact Information Primary Emergency Contact: Earlene Plater of [place] Phone: [phone] Mobile Phone: [phone] Relation: Daughter  Code Status:  DNR Goals of care: Advanced Directive information    06/16/2022   10:48 AM  Advanced Directives  Does Patient Have a Medical Advance Directive? Yes  Type of Paramedic of [place];Living will;Out of facility DNR (pink MOST or yellow form)  Does patient want to make changes to medical advance directive? No - Patient declined  Copy of [place] in Chart? Yes - validated most recent copy scanned in chart (See row information)  Pre-existing out of facility DNR order (yellow form or pink MOST form) Yellow form placed in chart (order not valid for inpatient use)     Chief Complaint  Patient presents with   Medical Management of Chronic Issues    Routine Visit, Discuss TDap    HPI:  Pt is a 88 y.o. female seen today for medical management of chronic diseases.    She currently resides on the skilled nursing unit at [name] PMH: HTN, allergic rhinitis, asthma, GERD, Alzheimer's, OA, osteoporosis, CKD 3b, and depression.   Candidal skin rash- ongoing, involves area under breasts, nystatin power started per nursing 11/12 Alzheimer's- CT head 2018 chronic ischemic microvascular disease, non ambulatory, requires assistance for all ADLs- has personal caregiver, sleeping more per nursing, no behaviors, remains on Namenda and hydroxyzine prn Asthma- followed by Dr. Silas Flood, 06/02 CXR unremarkable, intermittent cough/wheezing, remains on tessalon/budesonide/dextromethorphan/Duonebs/formoterol/  Dupixent/Singulair HTN- BUN/creat 18/0.8 01/22/2022, remains in lasix daily GERD- hgb 11.4 01/22/2022, remains in Prilosec Depression/Anxiety- no recent mood changes, Na+ 138 01/22/2022, remains on Prozac and vistaril prn Osteoporosis- DEXA studies discontinued due to goals of care, remains on calcium and vitamin D CKD- see above  No recent falls or injuries.    Recent blood pressures:  11/06- 138/75  11/05- 149/76  11/03- 137/84  Recent weights:  11/01- 162.6 lbs  10/01- 160 lbs  09/01- 159.8 lbs     Past Medical History:  Diagnosis Date   Age-related osteoporosis without current pathological fracture    Alzheimer's disease ([place])    with late onset   Arthritis    Asthma    Deficiency of other specified B group vitamins    Depression    Environmental allergies    mold   Fall    GERD without esophagitis    Hyperlipidemia    Hypertension    Hypertensive kidney disease with CKD (chronic kidney disease)    with stage I through stage 4 CKD    Memory loss    Seasonal allergies    Unspecified osteoarthritis, unspecified site    Past Surgical History:  Procedure Laterality Date   HIP ARTHROPLASTY  06/25/2011   Procedure: ARTHROPLASTY BIPOLAR HIP;  Surgeon: Laurice Record [place];  Location: [facility];  Service: Orthopedics;  Laterality: Right;   NASAL SINUS SURGERY     VAGINAL HYSTERECTOMY      Allergies  Allergen Reactions   Azithromycin Other (See Comments)    Nausea, weakness    Aspirin Other (See Comments)    Unknown reaction   Erythromycin    Molds & Smuts    Other     Seasonal Allergies.  Septra [Sulfamethoxazole-Trimethoprim]    Sulfa Antibiotics    Sulfonamide Derivatives Other (See Comments)    Unknown reaction    Outpatient Encounter Medications as of 06/16/2022  Medication Sig   acetaminophen (TYLENOL) 500 MG tablet Take 500 mg by mouth 3 (three) times daily.   benzonatate (TESSALON) 200 MG capsule Take 1 capsule (200 mg total) by mouth 3 (three)  times daily as needed for cough.   budesonide (PULMICORT) 0.5 MG/2ML nebulizer solution Take 0.5 mg by nebulization 2 (two) times daily.   Calcium Carb-Cholecalciferol (CALCIUM 600-D PO) Take 1 tablet by mouth daily.   Cholecalciferol (VITAMIN D) 2000 units tablet Take 2,000 Units by mouth at bedtime.   dextromethorphan (DELSYM) 30 MG/5ML liquid Take 60 mg by mouth every 12 (twelve) hours as needed for cough.   dupilumab (DUPIXENT) 300 MG/2ML prefilled syringe Inject 300 mg into the skin every 14 (fourteen) days.   EPINEPHrine 0.3 mg/0.3 mL IJ SOAJ injection Inject 0.3 mg into the muscle as needed for anaphylaxis.   FLUoxetine (PROZAC) 20 MG capsule Take 40 mg by mouth every morning.   fluticasone (FLONASE) 50 MCG/ACT nasal spray Place 2 sprays into both nostrils daily. 8 am - 11 am   formoterol (PERFOROMIST) 20 MCG/2ML nebulizer solution Take 20 mcg by nebulization 2 (two) times daily.   furosemide (LASIX) 20 MG tablet Take 20 mg by mouth daily. Hold for SBP<120   ipratropium-albuterol (DUONEB) 0.5-2.5 (3) MG/3ML SOLN Take 3 mLs by nebulization every 6 (six) hours as needed.   lactose free nutrition (BOOST) LIQD Take 237 mLs by mouth as needed.   memantine (NAMENDA) 10 MG tablet Take 1 tablet (10 mg total) by mouth 2 (two) times daily.   montelukast (SINGULAIR) 10 MG tablet Take 1 tablet (10 mg total) by mouth daily.   nystatin powder Apply 1 Application topically 2 (two) times daily. Apply powder under both breast for 7 days if not healed then let the MD know   omeprazole (PRILOSEC) 20 MG capsule Take 20 mg by mouth daily. 30 minutes before breakfast.   omeprazole (PRILOSEC) 20 MG capsule Take 20 mg by mouth every evening. 30 minutes before dinner.   Polyethyl Glycol-Propyl Glycol (SYSTANE) 0.4-0.3 % GEL ophthalmic gel Place 1 application into both eyes at bedtime.   polyethylene glycol (MIRALAX / GLYCOLAX) packet Take 17 g by mouth every 3 (three) days.   vitamin B-12 (CYANOCOBALAMIN) 1000 MCG  tablet Take 3,000 mcg by mouth daily.   [DISCONTINUED] docusate sodium (COLACE) 100 MG capsule Take 100 mg by mouth at bedtime.    [DISCONTINUED] hydrOXYzine (ATARAX) 25 MG tablet Take 25 mg by mouth 2 (two) times daily as needed.   [DISCONTINUED] rivaroxaban (XARELTO) 10 MG TABS tablet Take 1 tablet (10 mg total) by mouth daily.   No facility-administered encounter medications on file as of 06/16/2022.    Review of Systems  Unable to perform ROS: Dementia    Immunization History  Administered Date(s) Administered   Covid-19, Mrna,Vaccine(Spikevax)42yr and older 06/08/2022   Influenza Inj Mdck Quad Pf 05/18/2019, 05/07/2021   Influenza Split 04/04/2012, 05/03/2013, 05/10/2015   Influenza Whole 04/18/2008, 06/04/2009, 04/03/2010, 03/30/2011   Influenza, High Dose Seasonal PF 04/20/2018, 05/24/2020   Influenza,inj,Quad PF,6+ Mos 03/26/2017   Influenza-Unspecified 03/26/2010, 04/13/2012, 05/08/2013, 05/14/2014, 06/03/2014, 05/13/2015, 04/27/2016, 05/08/2022   Moderna Covid-19 Vaccine Bivalent Booster 138yr& up 11/28/2021   Moderna SARS-COV2 Booster Vaccination 06/13/2020, 03/25/2021, 05/14/2021   Moderna Sars-Covid-2 Vaccination 09/05/2019, 10/03/2019   Pneumococcal Conjugate-13 03/05/2014  Pneumococcal Polysaccharide-23 06/04/2009   Pneumococcal-Unspecified 03/13/2009   Td 03/13/2009, 04/13/2012   Zoster Recombinat (Shingrix) 08/03/2017, 10/01/2017   Zoster, Live 03/21/2010   Pertinent  Health Maintenance Due  Topic Date Due   INFLUENZA VACCINE  Completed   DEXA SCAN  Discontinued      12/07/2012   10:00 AM 01/11/2020    9:49 AM 09/26/2021    1:02 PM  Fall Risk  Falls in the past year? Yes 0 0  Was there an injury with Fall?  0 0  Fall Risk Category Calculator  0 0  Fall Risk Category  Low Low  Patient Fall Risk Level  Low fall risk Low fall risk  Patient at Risk for Falls Due to Impaired balance/gait  Impaired balance/gait  Fall risk Follow up   Falls evaluation  completed   Functional Status Survey:    Vitals:   06/16/22 1034  BP: 138/75  Pulse: 68  Resp: 16  Temp: 98.6 F (37 C)  SpO2: 96%  Weight: 162 lb (73.5 kg)  Height: '5\' 2"'$  (1.575 m)   Body mass index is 29.63 kg/m. Physical Exam Vitals reviewed.  Constitutional:      General: She is not in acute distress. HENT:     Head: Normocephalic.     Right Ear: There is no impacted cerumen.     Left Ear: There is no impacted cerumen.     Nose: Nose normal.     Mouth/Throat:     Mouth: Mucous membranes are moist.  Eyes:     General:        Right eye: No discharge.        Left eye: No discharge.  Cardiovascular:     Rate and Rhythm: Normal rate and regular rhythm.     Pulses: Normal pulses.     Heart sounds: Normal heart sounds.  Pulmonary:     Effort: Pulmonary effort is normal. No respiratory distress.     Breath sounds: Examination of the right-upper field reveals wheezing. Examination of the left-upper field reveals wheezing. Wheezing present. No rales.     Comments: expiratory Abdominal:     General: Bowel sounds are normal. There is no distension.     Palpations: Abdomen is soft.     Tenderness: There is no abdominal tenderness.  Musculoskeletal:     Cervical back: Neck supple.     Right lower leg: Edema present.     Left lower leg: Edema present.     Comments: Non pitting  Skin:    General: Skin is warm and dry.     Capillary Refill: Capillary refill takes less than 2 seconds.     Comments: Excoriated skin under breasts   Neurological:     General: No focal deficit present.     Mental Status: She is alert. Mental status is at baseline.     Motor: Weakness present.     Gait: Gait abnormal.     Comments: Wheelchair/hoyer transfer  Psychiatric:        Mood and Affect: Mood normal.     Comments: Very pleasant, follows some commands, alert to self/person     Labs reviewed: Recent Labs    07/14/21 0000 01/22/22 0000  NA 144 138  K 4.2 4.3  CL 105 106  CO2  24* 24*  BUN 31* 18  CREATININE 1.0 0.8  CALCIUM 9.5 9.3   Recent Labs    01/22/22 0000  AST 11*  ALT 9  ALKPHOS 101  ALBUMIN 3.3*   Recent Labs    07/14/21 0000 01/22/22 0000  WBC 6.8 6.4  HGB 11.2* 11.4*  HCT 35* 34*  PLT 236 218   Lab Results  Component Value Date   TSH 1.52 09/23/2020   No results found for: "HGBA1C" No results found for: "CHOL", "HDL", "LDLCALC", "LDLDIRECT", "TRIG", "CHOLHDL"  Significant Diagnostic Results in last 30 days:  No results found.  Assessment/Plan 1. Candidal skin infection - area under breasts excoriated - 11/12 nystatin powder started per [facility] protocol - recommend moisture wicking fabric under breasts for prevention- discussed with nursing  2. Late onset Alzheimer's dementia with behavioral disturbance ([place]) - MMSE 22/30 - no recent behaviors - dependent with all ADLs - cont Namenda and hydroxyzine  3. Severe persistent asthma without complication - expiratory wheezing today- nebs given after exam- improved - followed by pulmonary - cont tessalon/budesonide/dextromethorphan/Duonebs/formoterol/ Dupixent/Singulair  4. Essential hypertension - controlled with furosemide  5. Gastro-esophageal reflux disease without esophagitis - hgb stable - cont omeprazole  6. Major depressive disorder, single episode, in remission ([place]) - no mood changes - cont Prozac  7. Anxiety - cont hydroxyzine prn  8. Osteoporosis without current pathological fracture, unspecified osteoporosis type - no further DEXA scan per goals of care - non ambulatory as well - cont calcium/vitamin D  9. Stage 3b chronic kidney disease ([place]) - avoid nephrotoxic drugs like NSAIDS and dose adjust medications to be renally excreted - encourage hydration with water     Family/ staff Communication: plan discussed with patient and nurse  Labs/tests ordered:  none

## 2022-07-13 ENCOUNTER — Non-Acute Institutional Stay (SKILLED_NURSING_FACILITY): Payer: Medicare Other | Admitting: Internal Medicine

## 2022-07-13 ENCOUNTER — Encounter: Payer: Self-pay | Admitting: Internal Medicine

## 2022-07-13 DIAGNOSIS — I10 Essential (primary) hypertension: Secondary | ICD-10-CM | POA: Diagnosis not present

## 2022-07-13 DIAGNOSIS — F325 Major depressive disorder, single episode, in full remission: Secondary | ICD-10-CM

## 2022-07-13 DIAGNOSIS — N1832 Chronic kidney disease, stage 3b: Secondary | ICD-10-CM | POA: Diagnosis not present

## 2022-07-13 DIAGNOSIS — J4550 Severe persistent asthma, uncomplicated: Secondary | ICD-10-CM | POA: Diagnosis not present

## 2022-07-13 DIAGNOSIS — K219 Gastro-esophageal reflux disease without esophagitis: Secondary | ICD-10-CM | POA: Diagnosis not present

## 2022-07-13 DIAGNOSIS — G301 Alzheimer's disease with late onset: Secondary | ICD-10-CM

## 2022-07-13 DIAGNOSIS — F419 Anxiety disorder, unspecified: Secondary | ICD-10-CM | POA: Diagnosis not present

## 2022-07-13 DIAGNOSIS — F02818 Dementia in other diseases classified elsewhere, unspecified severity, with other behavioral disturbance: Secondary | ICD-10-CM

## 2022-07-13 NOTE — Progress Notes (Signed)
Location:  Medical illustrator of Service:  SNF (31) Provider:  Mahlon Gammon, MD   Mahlon Gammon, MD  Patient Care Team: Mahlon Gammon, MD as PCP - General (Internal Medicine) Storm Frisk, MD (Pulmonary Disease)  Extended Emergency Contact Information Primary Emergency Contact: Mel Almond of [place] Home Phone: [phone] Mobile Phone: [phone] Relation: Daughter  Code Status:  DNR Goals of care: Advanced Directive information    07/13/2022   11:09 AM  Advanced Directives  Does Patient Have a Medical Advance Directive? Yes  Type of Estate agent of [place];Living will;Out of facility DNR (pink MOST or yellow form)  Does patient want to make changes to medical advance directive? No - Guardian declined  Copy of Healthcare Power of Attorney in Chart? Yes - validated most recent copy scanned in chart (See row information)     Chief Complaint  Patient presents with   Medical Management of Chronic Issues    Routine Visit     HPI:  Pt is a 88 y.o. female seen today for medical management of chronic diseases.    Lives in SNF  Patient has a history of Alzheimer's dementia dependent for her ADLs Osteoporosis and knee osteoarthritis CKD stage 3b Severe asthma Eosinophilic HLD and hypertension     She is stable. No new Nursing issues.  Sometimes get anxious and Screams  Using [employer] lift /Stand up lift  Her weight is stable  No Falls Wt Readings from Last 3 Encounters:  07/13/22 159 lb 12.8 oz (72.5 kg)  06/16/22 162 lb (73.5 kg)  05/22/22 160 lb (72.6 kg)    Past Medical History:  Diagnosis Date   Age-related osteoporosis without current pathological fracture    Alzheimer's disease ([facility])    with late onset   Arthritis    Asthma    Deficiency of other specified B group vitamins    Depression    Environmental allergies    mold   Fall    GERD without esophagitis     Hyperlipidemia    Hypertension    Hypertensive kidney disease with CKD (chronic kidney disease)    with stage I through stage 4 CKD    Memory loss    Seasonal allergies    Unspecified osteoarthritis, unspecified site    Past Surgical History:  Procedure Laterality Date   HIP ARTHROPLASTY  06/25/2011   Procedure: ARTHROPLASTY BIPOLAR HIP;  Surgeon: Illene Labrador [place];  Location: [facility];  Service: Orthopedics;  Laterality: Right;   NASAL SINUS SURGERY     VAGINAL HYSTERECTOMY      Allergies  Allergen Reactions   Azithromycin Other (See Comments)    Nausea, weakness    Aspirin Other (See Comments)    Unknown reaction   Erythromycin    Molds & Smuts    Other     Seasonal Allergies.   Septra [Sulfamethoxazole-Trimethoprim]    Sulfa Antibiotics    Sulfonamide Derivatives Other (See Comments)    Unknown reaction    Outpatient Encounter Medications as of 07/13/2022  Medication Sig   acetaminophen (TYLENOL) 500 MG tablet Take 500 mg by mouth 3 (three) times daily.   benzonatate (TESSALON) 200 MG capsule Take 1 capsule (200 mg total) by mouth 3 (three) times daily as needed for cough.   budesonide (PULMICORT) 0.5 MG/2ML nebulizer solution Take 0.5 mg by nebulization 2 (two) times daily.   Calcium Carb-Cholecalciferol (CALCIUM 600-D PO) Take 1 tablet by mouth daily.  Cholecalciferol (VITAMIN D) 2000 units tablet Take 2,000 Units by mouth at bedtime.   dextromethorphan (DELSYM) 30 MG/5ML liquid Take 60 mg by mouth every 12 (twelve) hours as needed for cough.   dupilumab (DUPIXENT) 300 MG/2ML prefilled syringe Inject 300 mg into the skin every 14 (fourteen) days.   EPINEPHrine 0.3 mg/0.3 mL IJ SOAJ injection Inject 0.3 mg into the muscle as needed for anaphylaxis.   FLUoxetine (PROZAC) 20 MG capsule Take 40 mg by mouth every morning.   fluticasone (FLONASE) 50 MCG/ACT nasal spray Place 2 sprays into both nostrils daily. 8 am - 11 am   formoterol (PERFOROMIST) 20 MCG/2ML nebulizer  solution Take 20 mcg by nebulization 2 (two) times daily.   furosemide (LASIX) 20 MG tablet Take 20 mg by mouth daily. Hold for SBP<120   ipratropium-albuterol (DUONEB) 0.5-2.5 (3) MG/3ML SOLN Take 3 mLs by nebulization every 6 (six) hours as needed.   lactose free nutrition (BOOST) LIQD Take 237 mLs by mouth as needed.   memantine (NAMENDA) 10 MG tablet Take 1 tablet (10 mg total) by mouth 2 (two) times daily.   montelukast (SINGULAIR) 10 MG tablet Take 1 tablet (10 mg total) by mouth daily.   omeprazole (PRILOSEC) 20 MG capsule Take 20 mg by mouth daily. 30 minutes before breakfast.   omeprazole (PRILOSEC) 20 MG capsule Take 20 mg by mouth every evening. 30 minutes before dinner.   Polyethyl Glycol-Propyl Glycol (SYSTANE) 0.4-0.3 % GEL ophthalmic gel Place 1 application into both eyes at bedtime.   polyethylene glycol (MIRALAX / GLYCOLAX) packet Take 17 g by mouth every 3 (three) days.   vitamin B-12 (CYANOCOBALAMIN) 1000 MCG tablet Take 3,000 mcg by mouth daily.   nystatin powder Apply 1 Application topically 2 (two) times daily. Apply powder under both breast for 7 days if not healed then let the MD know   [DISCONTINUED] rivaroxaban (XARELTO) 10 MG TABS tablet Take 1 tablet (10 mg total) by mouth daily.   No facility-administered encounter medications on file as of 07/13/2022.    Review of Systems  Unable to perform ROS: Dementia    Immunization History  Administered Date(s) Administered   Covid-19, Mrna,Vaccine(Spikevax)56yrs and older 06/08/2022   Influenza Inj Mdck Quad Pf 05/18/2019, 05/07/2021   Influenza Split 04/04/2012, 05/03/2013, 05/10/2015   Influenza Whole 04/18/2008, 06/04/2009, 04/03/2010, 03/30/2011   Influenza, High Dose Seasonal PF 04/20/2018, 05/24/2020   Influenza,inj,Quad PF,6+ Mos 03/26/2017   Influenza-Unspecified 03/26/2010, 04/13/2012, 05/08/2013, 05/14/2014, 06/03/2014, 05/13/2015, 04/27/2016, 05/08/2022   Moderna Covid-19 Vaccine Bivalent Booster 32yrs & up  11/28/2021   Moderna SARS-COV2 Booster Vaccination 06/13/2020, 03/25/2021, 05/14/2021   Moderna Sars-Covid-2 Vaccination 09/05/2019, 10/03/2019   Pneumococcal Conjugate-13 03/05/2014   Pneumococcal Polysaccharide-23 06/04/2009   Pneumococcal-Unspecified 03/13/2009   Td 03/13/2009, 04/13/2012   Zoster Recombinat (Shingrix) 08/03/2017, 10/01/2017   Zoster, Live 03/21/2010   Pertinent  Health Maintenance Due  Topic Date Due   INFLUENZA VACCINE  Completed   DEXA SCAN  Discontinued      12/07/2012   10:00 AM 01/11/2020    9:49 AM 09/26/2021    1:02 PM  Fall Risk  Falls in the past year? Yes 0 0  Was there an injury with Fall?  0 0  Fall Risk Category Calculator  0 0  Fall Risk Category  Low Low  Patient Fall Risk Level  Low fall risk Low fall risk  Patient at Risk for Falls Due to Impaired balance/gait  Impaired balance/gait  Fall risk Follow up   Falls  evaluation completed   Functional Status Survey:    Vitals:   07/13/22 1102  BP: (!) 117/54  Pulse: 71  Temp: 97.6 F (36.4 C)  TempSrc: Temporal  Weight: 159 lb 12.8 oz (72.5 kg)  Height: 5\' 2"  (1.575 m)   Body mass index is 29.23 kg/m. Physical Exam Vitals reviewed.  Constitutional:      Appearance: Normal appearance.  HENT:     Head: Normocephalic.     Nose: Nose normal.     Mouth/Throat:     Mouth: Mucous membranes are moist.     Pharynx: Oropharynx is clear.  Eyes:     Pupils: Pupils are equal, round, and reactive to light.  Cardiovascular:     Rate and Rhythm: Normal rate and regular rhythm.     Pulses: Normal pulses.     Heart sounds: Normal heart sounds. No murmur heard. Pulmonary:     Effort: Pulmonary effort is normal.     Breath sounds: Normal breath sounds.  Abdominal:     General: Abdomen is flat. Bowel sounds are normal.     Palpations: Abdomen is soft.  Musculoskeletal:        General: Swelling present.     Cervical back: Neck supple.  Skin:    General: Skin is warm.  Neurological:      General: No focal deficit present.     Mental Status: She is alert.  Psychiatric:        Mood and Affect: Mood normal.        Thought Content: Thought content normal.     Labs reviewed: Recent Labs    07/14/21 0000 01/22/22 0000  NA 144 138  K 4.2 4.3  CL 105 106  CO2 24* 24*  BUN 31* 18  CREATININE 1.0 0.8  CALCIUM 9.5 9.3   Recent Labs    01/22/22 0000  AST 11*  ALT 9  ALKPHOS 101  ALBUMIN 3.3*   Recent Labs    07/14/21 0000 01/22/22 0000  WBC 6.8 6.4  HGB 11.2* 11.4*  HCT 35* 34*  PLT 236 218   Lab Results  Component Value Date   TSH 1.52 09/23/2020   No results found for: "HGBA1C" No results found for: "CHOL", "HDL", "LDLCALC", "LDLDIRECT", "TRIG", "CHOLHDL"  Significant Diagnostic Results in last 30 days:  No results found.  Assessment/Plan 1. Severe persistent asthma without complication Dupixent,Flonase and Performist and Budesonide Follows with Pulmonary  2. Late onset Alzheimer's dementia with behavioral disturbance ([facility]) On Namenda  Vistaril PRn  3. Essential hypertension Only on lasix  4. Gastro-esophageal reflux disease without esophagitis Prilosec  5. Major depressive disorder, single episode, in remission ([facility]) On Prozac  6. Anxiety vistaril  7. Stage 3b chronic kidney disease ([facility]) Creat stable  RSV ordered  Family/ staff Communication:   Labs/tests ordered:

## 2022-08-19 ENCOUNTER — Encounter: Payer: Self-pay | Admitting: Orthopedic Surgery

## 2022-08-19 ENCOUNTER — Non-Acute Institutional Stay (SKILLED_NURSING_FACILITY): Payer: Medicare Other | Admitting: Orthopedic Surgery

## 2022-08-19 DIAGNOSIS — G301 Alzheimer's disease with late onset: Secondary | ICD-10-CM

## 2022-08-19 DIAGNOSIS — K0889 Other specified disorders of teeth and supporting structures: Secondary | ICD-10-CM | POA: Diagnosis not present

## 2022-08-19 DIAGNOSIS — K5901 Slow transit constipation: Secondary | ICD-10-CM | POA: Diagnosis not present

## 2022-08-19 DIAGNOSIS — F02818 Dementia in other diseases classified elsewhere, unspecified severity, with other behavioral disturbance: Secondary | ICD-10-CM | POA: Diagnosis not present

## 2022-08-19 DIAGNOSIS — N1832 Chronic kidney disease, stage 3b: Secondary | ICD-10-CM | POA: Diagnosis not present

## 2022-08-19 DIAGNOSIS — K219 Gastro-esophageal reflux disease without esophagitis: Secondary | ICD-10-CM

## 2022-08-19 DIAGNOSIS — M810 Age-related osteoporosis without current pathological fracture: Secondary | ICD-10-CM

## 2022-08-19 DIAGNOSIS — F325 Major depressive disorder, single episode, in full remission: Secondary | ICD-10-CM

## 2022-08-19 DIAGNOSIS — J4550 Severe persistent asthma, uncomplicated: Secondary | ICD-10-CM | POA: Diagnosis not present

## 2022-08-19 DIAGNOSIS — I10 Essential (primary) hypertension: Secondary | ICD-10-CM | POA: Diagnosis not present

## 2022-08-19 DIAGNOSIS — F419 Anxiety disorder, unspecified: Secondary | ICD-10-CM

## 2022-08-19 NOTE — Progress Notes (Signed)
Location:  [facility] Room Number: 371 Place of Service:  SNF ([phone]) Provider:  Windell Moulding, NP   Patient Care Team: Virgie Dad, MD as PCP - General (Internal Medicine) Elsie Stain, MD (Pulmonary Disease)  Extended Emergency Contact Information Primary Emergency Contact: Earlene Plater of [place] Phone: [phone] Mobile Phone: [phone] Relation: Daughter  Code Status:  DNR Goals of care: Advanced Directive information    08/19/2022    2:35 PM  Advanced Directives  Does Patient Have a Medical Advance Directive? Yes  Type of Paramedic of [place];Living will;Out of facility DNR (pink MOST or yellow form)  Does patient want to make changes to medical advance directive? No - Patient declined  Copy of [place] in Chart? Yes - validated most recent copy scanned in chart (See row information)     Chief Complaint  Patient presents with   Medical Management of Chronic Issues    Routine Visit    HPI:  Pt is a 89 y.o. female seen today for medical management of chronic diseases.    She currently resides on the skilled nursing unit at [name] PMH: HTN, allergic rhinitis, asthma, GERD, Alzheimer's, OA, osteoporosis, CKD 3b, and depression.  HTN- BUN/creat 18/0.8 01/22/2022, remains in lasix daily  CKD- see above Alzheimer's- CT head 2018 chronic ischemic microvascular disease, non ambulatory, requires assistance for all ADLs- has personal caregiver, continues to sleep more often, speech slurred more, calls out for late husband at times, remains on Namenda and hydroxyzine prn Asthma- followed by Dr. Silas Flood, 06/02 CXR unremarkable, intermittent cough/wheezing, remains on tessalon/budesonide/dextromethorphan/Duonebs/formoterol/ Dupixent/Singulair GERD- hgb 11.4 01/22/2022, remains on Prilosec Depression/Anxiety- no recent mood changes, Na+ 138 01/22/2022, remains  on Prozac and vistaril prn Osteoporosis- DEXA studies discontinued due to goals of care, remains on calcium and vitamin D Dental pain- c/o right upper jaw pain 01/02, denies pain today, no recent complaints per nursing Constipation- LBM 01/17, remains on miralax Q3 days  Recent blood pressure:  01/16- 142/63  01/14- 170/69  01/13- 138/78  Recent weights:  01/02- 158.4 lbs  12/01- 159.8 lbs  11/01- 162.6 lbs     Past Medical History:  Diagnosis Date   Age-related osteoporosis without current pathological fracture    Alzheimer's disease ([place])    with late onset   Arthritis    Asthma    Deficiency of other specified B group vitamins    Depression    Environmental allergies    mold   Fall    GERD without esophagitis    Hyperlipidemia    Hypertension    Hypertensive kidney disease with CKD (chronic kidney disease)    with stage I through stage 4 CKD    Memory loss    Seasonal allergies    Unspecified osteoarthritis, unspecified site    Past Surgical History:  Procedure Laterality Date   HIP ARTHROPLASTY  06/25/2011   Procedure: ARTHROPLASTY BIPOLAR HIP;  Surgeon: Laurice Record [place];  Location: [facility];  Service: Orthopedics;  Laterality: Right;   NASAL SINUS SURGERY     VAGINAL HYSTERECTOMY      Allergies  Allergen Reactions   Azithromycin Other (See Comments)    Nausea, weakness    Aspirin Other (See Comments)    Unknown reaction   Erythromycin    Molds & Smuts    Other     Seasonal Allergies.   Septra [Sulfamethoxazole-Trimethoprim]    Sulfa Antibiotics    Sulfonamide Derivatives  Other (See Comments)    Unknown reaction    Outpatient Encounter Medications as of 08/19/2022  Medication Sig   acetaminophen (TYLENOL) 500 MG tablet Take 500 mg by mouth 3 (three) times daily.   benzonatate (TESSALON) 200 MG capsule Take 1 capsule (200 mg total) by mouth 3 (three) times daily as needed for cough.   budesonide (PULMICORT) 0.5 MG/2ML nebulizer solution Take 0.5 mg  by nebulization 2 (two) times daily.   Calcium Carb-Cholecalciferol (CALCIUM 600-D PO) Take 1 tablet by mouth daily.   Cholecalciferol (VITAMIN D) 2000 units tablet Take 2,000 Units by mouth at bedtime.   dextromethorphan (DELSYM) 30 MG/5ML liquid Take 60 mg by mouth every 12 (twelve) hours as needed for cough.   dupilumab (DUPIXENT) 300 MG/2ML prefilled syringe Inject 300 mg into the skin every 14 (fourteen) days.   EPINEPHrine 0.3 mg/0.3 mL IJ SOAJ injection Inject 0.3 mg into the muscle as needed for anaphylaxis.   FLUoxetine (PROZAC) 20 MG capsule Take 40 mg by mouth every morning.   fluticasone (FLONASE) 50 MCG/ACT nasal spray Place 2 sprays into both nostrils daily. 8 am - 11 am   formoterol (PERFOROMIST) 20 MCG/2ML nebulizer solution Take 20 mcg by nebulization 2 (two) times daily.   furosemide (LASIX) 20 MG tablet Take 20 mg by mouth daily. Hold for SBP<120   hydrOXYzine (ATARAX) 25 MG tablet Take 25 mg by mouth 2 (two) times daily as needed.   ipratropium-albuterol (DUONEB) 0.5-2.5 (3) MG/3ML SOLN Take 3 mLs by nebulization every 6 (six) hours as needed.   lactose free nutrition (BOOST) LIQD Take 237 mLs by mouth as needed.   memantine (NAMENDA) 10 MG tablet Take 1 tablet (10 mg total) by mouth 2 (two) times daily.   montelukast (SINGULAIR) 10 MG tablet Take 1 tablet (10 mg total) by mouth daily.   nystatin powder Apply 1 Application topically 2 (two) times daily. Apply powder under both breast for 7 days if not healed then let the MD know   omeprazole (PRILOSEC) 20 MG capsule Take 20 mg by mouth daily. 30 minutes before breakfast.   omeprazole (PRILOSEC) 20 MG capsule Take 20 mg by mouth every evening. 30 minutes before dinner.   Polyethyl Glycol-Propyl Glycol (SYSTANE) 0.4-0.3 % GEL ophthalmic gel Place 1 application into both eyes at bedtime.   polyethylene glycol (MIRALAX / GLYCOLAX) packet Take 17 g by mouth every 3 (three) days.   vitamin B-12 (CYANOCOBALAMIN) 1000 MCG tablet Take  3,000 mcg by mouth daily.   [DISCONTINUED] rivaroxaban (XARELTO) 10 MG TABS tablet Take 1 tablet (10 mg total) by mouth daily.   No facility-administered encounter medications on file as of 08/19/2022.    Review of Systems  Unable to perform ROS: Dementia    Immunization History  Administered Date(s) Administered   Covid-19, Mrna,Vaccine(Spikevax)79yr and older 06/08/2022   Influenza Inj Mdck Quad Pf 05/18/2019, 05/07/2021   Influenza Split 04/04/2012, 05/03/2013, 05/10/2015   Influenza Whole 04/18/2008, 06/04/2009, 04/03/2010, 03/30/2011   Influenza, High Dose Seasonal PF 04/20/2018, 05/24/2020   Influenza,inj,Quad PF,6+ Mos 03/26/2017   Influenza-Unspecified 03/26/2010, 04/13/2012, 05/08/2013, 05/14/2014, 06/03/2014, 05/13/2015, 04/27/2016, 05/08/2022   Moderna Covid-19 Vaccine Bivalent Booster 120yr& up 11/28/2021   Moderna SARS-COV2 Booster Vaccination 06/13/2020, 03/25/2021, 05/14/2021   Moderna Sars-Covid-2 Vaccination 09/05/2019, 10/03/2019   Pneumococcal Conjugate-13 03/05/2014   Pneumococcal Polysaccharide-23 06/04/2009   Pneumococcal-Unspecified 03/13/2009   Td 03/13/2009, 04/13/2012   Zoster Recombinat (Shingrix) 08/03/2017, 10/01/2017   Zoster, Live 03/21/2010   Pertinent  Health Maintenance  Due  Topic Date Due   INFLUENZA VACCINE  Completed   DEXA SCAN  Discontinued      12/07/2012   10:00 AM 01/11/2020    9:49 AM 09/26/2021    1:02 PM 08/19/2022    2:35 PM  Fall Risk  Falls in the past year? Yes 0 0 0  Was there an injury with Fall?  0 0 0  Fall Risk Category Calculator  0 0 0  Fall Risk Category (Retired)  Low Low   (RETIRED) Patient Fall Risk Level  Low fall risk Low fall risk   Patient at Risk for Falls Due to Impaired balance/gait  Impaired balance/gait No Fall Risks  Fall risk Follow up   Falls evaluation completed Falls evaluation completed   Functional Status Survey:    Vitals:   08/19/22 1429  BP: (!) 142/63  Pulse: (!) 56  Resp: 17  Temp:  (!) 97.5 F (36.4 C)  SpO2: 100%  Weight: 158 lb 4 oz (71.8 kg)  Height: '5\' 2"'$  (1.575 m)   Body mass index is 28.94 kg/m. Physical Exam Vitals reviewed.  Constitutional:      General: She is not in acute distress. HENT:     Head: Normocephalic.     Right Ear: There is no impacted cerumen.     Left Ear: There is no impacted cerumen.     Nose: Nose normal.     Mouth/Throat:     Mouth: Mucous membranes are moist.  Eyes:     General:        Right eye: No discharge.        Left eye: No discharge.  Cardiovascular:     Rate and Rhythm: Normal rate and regular rhythm.     Pulses: Normal pulses.     Heart sounds: Normal heart sounds.  Pulmonary:     Effort: Pulmonary effort is normal. No respiratory distress.     Breath sounds: Normal breath sounds. No wheezing.  Abdominal:     General: Bowel sounds are normal. There is no distension.     Palpations: Abdomen is soft.     Tenderness: There is no abdominal tenderness.  Musculoskeletal:     Cervical back: Neck supple.     Right lower leg: Edema present.     Left lower leg: Edema present.     Comments: Non pitting, ted hose on  Skin:    General: Skin is warm and dry.     Capillary Refill: Capillary refill takes less than 2 seconds.  Neurological:     General: No focal deficit present.     Mental Status: She is alert. Mental status is at baseline.     Motor: Weakness present.     Gait: Gait abnormal.     Comments: Harrel Lemon, judditta chair  Psychiatric:        Mood and Affect: Mood normal.        Behavior: Behavior normal.     Comments: Aphasia/slurred speech, follows commands, alert to self/familiar face     Labs reviewed: Recent Labs    01/22/22 0000  NA 138  K 4.3  CL 106  CO2 24*  BUN 18  CREATININE 0.8  CALCIUM 9.3   Recent Labs    01/22/22 0000  AST 11*  ALT 9  ALKPHOS 101  ALBUMIN 3.3*   Recent Labs    01/22/22 0000  WBC 6.4  HGB 11.4*  HCT 34*  PLT 218   Lab Results  Component  Value Date    TSH 1.52 09/23/2020   No results found for: "HGBA1C" No results found for: "CHOL", "HDL", "LDLCALC", "LDLDIRECT", "TRIG", "CHOLHDL"  Significant Diagnostic Results in last 30 days:  No results found.  Assessment/Plan 1. Essential hypertension - controlled with furosemide  2. Stage 3b chronic kidney disease ([place]) - avoid nephrotoxic drugs like NSAIDS and dose adjust medications to be renally excreted - encourage hydration with water  - bmp- future  3. Late onset Alzheimer's dementia with behavioral disturbance ([place]) - sleeping more - aphasia/slurred speech - dependent with all ADLs - yells out for late husband - cont skilled nursing - cont Namenda and hydroxyzine prn  4. Severe persistent asthma without complication - no recent exacerbations - cont tessalon/budesonide/dextromethorphan/Duonebs/formoterol/ Dupixent/Singulair  5. Gastro-esophageal reflux disease without esophagitis - hgb stable - cont omeprazole - cbc/diff- future  6. Major depressive disorder, single episode, in remission ([place]) - no mood changes - cont prozac   7. Anxiety - see above - cont hydroxyzine prn  8. Osteoporosis without current pathological fracture, unspecified osteoporosis type - no further studies per goals of care - cont calcium and vitamin d supplement  9. Pain, dental - noted 01/02 to right upper jaw - scheduled to see in house dentist  10. Slow transit constipation - abdomen soft - cont miralax Q3 days    Family/ staff Communication: plan discussed with patient and nurse  Labs/tests ordered:  cbc/diff, bmp 01/18

## 2022-08-20 DIAGNOSIS — N189 Chronic kidney disease, unspecified: Secondary | ICD-10-CM | POA: Diagnosis not present

## 2022-08-20 LAB — BASIC METABOLIC PANEL
BUN: 28 — AB (ref 4–21)
CO2: 21 (ref 13–22)
Chloride: 105 (ref 99–108)
Creatinine: 0.9 (ref 0.5–1.1)
Glucose: 88
Potassium: 4.3 mEq/L (ref 3.5–5.1)
Sodium: 137 (ref 137–147)

## 2022-08-20 LAB — COMPREHENSIVE METABOLIC PANEL
Albumin: 3.3 — AB (ref 3.5–5.0)
Calcium: 9.5 (ref 8.7–10.7)

## 2022-08-20 LAB — HEPATIC FUNCTION PANEL
ALT: 12 U/L (ref 7–35)
AST: 17 (ref 13–35)
Alkaline Phosphatase: 100 (ref 25–125)

## 2022-08-20 LAB — CBC AND DIFFERENTIAL
HCT: 33 — AB (ref 36–46)
Hemoglobin: 11.1 — AB (ref 12.0–16.0)
Platelets: 186 10*3/uL (ref 150–400)
WBC: 5.5

## 2022-08-20 LAB — CBC: RBC: 3.63 — AB (ref 3.87–5.11)

## 2022-08-31 DIAGNOSIS — D23112 Other benign neoplasm of skin of right lower eyelid, including canthus: Secondary | ICD-10-CM | POA: Diagnosis not present

## 2022-08-31 DIAGNOSIS — H02403 Unspecified ptosis of bilateral eyelids: Secondary | ICD-10-CM | POA: Diagnosis not present

## 2022-08-31 DIAGNOSIS — Z961 Presence of intraocular lens: Secondary | ICD-10-CM | POA: Diagnosis not present

## 2022-09-22 ENCOUNTER — Non-Acute Institutional Stay (SKILLED_NURSING_FACILITY): Payer: Medicare Other | Admitting: Orthopedic Surgery

## 2022-09-22 ENCOUNTER — Encounter: Payer: Self-pay | Admitting: Orthopedic Surgery

## 2022-09-22 DIAGNOSIS — D692 Other nonthrombocytopenic purpura: Secondary | ICD-10-CM

## 2022-09-22 DIAGNOSIS — J4550 Severe persistent asthma, uncomplicated: Secondary | ICD-10-CM

## 2022-09-22 DIAGNOSIS — F325 Major depressive disorder, single episode, in full remission: Secondary | ICD-10-CM

## 2022-09-22 DIAGNOSIS — K219 Gastro-esophageal reflux disease without esophagitis: Secondary | ICD-10-CM | POA: Diagnosis not present

## 2022-09-22 DIAGNOSIS — N1832 Chronic kidney disease, stage 3b: Secondary | ICD-10-CM | POA: Diagnosis not present

## 2022-09-22 DIAGNOSIS — B372 Candidiasis of skin and nail: Secondary | ICD-10-CM

## 2022-09-22 DIAGNOSIS — M810 Age-related osteoporosis without current pathological fracture: Secondary | ICD-10-CM

## 2022-09-22 DIAGNOSIS — G301 Alzheimer's disease with late onset: Secondary | ICD-10-CM | POA: Diagnosis not present

## 2022-09-22 DIAGNOSIS — F02818 Dementia in other diseases classified elsewhere, unspecified severity, with other behavioral disturbance: Secondary | ICD-10-CM

## 2022-09-22 DIAGNOSIS — F419 Anxiety disorder, unspecified: Secondary | ICD-10-CM

## 2022-09-22 DIAGNOSIS — I10 Essential (primary) hypertension: Secondary | ICD-10-CM | POA: Diagnosis not present

## 2022-09-22 NOTE — Progress Notes (Signed)
Location:   [facility] Room Number: 121-A Place of Service:  SNF [phone]) Provider:  Windell Moulding, NP  PCP: Virgie Dad, MD  Patient Care Team: Virgie Dad, MD as PCP - General (Internal Medicine) Elsie Stain, MD (Pulmonary Disease)  Extended Emergency Contact Information Primary Emergency Contact: Earlene Plater of [place] Phone: [phone] Mobile Phone: [phone] Relation: Daughter  Code Status:  DNR Goals of care: Advanced Directive information    09/22/2022   10:48 AM  Advanced Directives  Does Patient Have a Medical Advance Directive? Yes  Type of Paramedic of [place];Living will;Out of facility DNR (pink MOST or yellow form)  Does patient want to make changes to medical advance directive? No - Patient declined  Copy of [place] in Chart? Yes - validated most recent copy scanned in chart (See row information)     Chief Complaint  Patient presents with   Medical Management of Chronic Issues    Routine Visit.    Health Maintenance    Discuss the need for AWV.   Immunizations    Discuss the need for DTAP vaccine.     HPI:  Pt is a 89 y.o. female seen today for medical management of chronic diseases.    She currently resides on the skilled nursing unit at [name] PMH: HTN, allergic rhinitis, asthma, GERD, Alzheimer's, OA, osteoporosis, CKD 3b, and depression.   HTN- BUN/creat 28/0.9 08/20/2022, remains in lasix daily  CKD- see above Alzheimer's- CT head 2018 chronic ischemic microvascular disease, non ambulatory, requires assistance for all ADLs- has personal caregiver, sleeping more, aphasia, calls out for late husband at times, remains on Namenda and hydroxyzine prn Asthma- followed by Dr. Silas Flood, 06/02 CXR unremarkable, intermittent cough/wheezing, remains on tessalon/budesonide/dextromethorphan/Duonebs/formoterol/ Dupixent/Singulair GERD-  hgb 11.1 08/20/2022, remains on Prilosec Depression/Anxiety- no recent mood changes, Na+ 137 08/20/2022, remains on Prozac and vistaril prn Osteoporosis- DEXA studies discontinued due to goals of care, remains on calcium and vitamin D  Recent blood pressures:  02/11- 128/69  02/09- 125/55  01/27- 139/82  Recent weights:  02/01- 157 lbs  01/02- 158.4 lbs  12/01- 159.8 lbs    Past Medical History:  Diagnosis Date   Age-related osteoporosis without current pathological fracture    Alzheimer's disease ([place])    with late onset   Arthritis    Asthma    Deficiency of other specified B group vitamins    Depression    Environmental allergies    mold   Fall    GERD without esophagitis    Hyperlipidemia    Hypertension    Hypertensive kidney disease with CKD (chronic kidney disease)    with stage I through stage 4 CKD    Memory loss    Seasonal allergies    Unspecified osteoarthritis, unspecified site    Past Surgical History:  Procedure Laterality Date   HIP ARTHROPLASTY  06/25/2011   Procedure: ARTHROPLASTY BIPOLAR HIP;  Surgeon: Laurice Record [place];  Location: [facility];  Service: Orthopedics;  Laterality: Right;   NASAL SINUS SURGERY     VAGINAL HYSTERECTOMY      Allergies  Allergen Reactions   Azithromycin Other (See Comments)    Nausea, weakness    Aspirin Other (See Comments)    Unknown reaction   Erythromycin    Molds & Smuts    Other     Seasonal Allergies.   Septra [Sulfamethoxazole-Trimethoprim]    Sulfa Antibiotics  Sulfonamide Derivatives Other (See Comments)    Unknown reaction    Allergies as of 09/22/2022       Reactions   Azithromycin Other (See Comments)   Nausea, weakness    Aspirin Other (See Comments)   Unknown reaction   Erythromycin    Molds & Smuts    Other    Seasonal Allergies.   Septra [sulfamethoxazole-trimethoprim]    Sulfa Antibiotics    Sulfonamide Derivatives Other (See Comments)   Unknown reaction        Medication List         Accurate as of September 22, 2022 10:49 AM. If you have any questions, ask your nurse or doctor.          acetaminophen 500 MG tablet Commonly known as: TYLENOL Take 500 mg by mouth 3 (three) times daily.   benzonatate 200 MG capsule Commonly known as: TESSALON Take 1 capsule (200 mg total) by mouth 3 (three) times daily as needed for cough.   budesonide 0.5 MG/2ML nebulizer solution Commonly known as: PULMICORT Take 0.5 mg by nebulization 2 (two) times daily.   CALCIUM 600-D PO Take 1 tablet by mouth daily.   cyanocobalamin 1000 MCG tablet Commonly known as: VITAMIN B12 Take 3,000 mcg by mouth daily.   dextromethorphan 30 MG/5ML liquid Commonly known as: DELSYM Take 60 mg by mouth every 12 (twelve) hours as needed for cough.   Dupixent 300 MG/2ML prefilled syringe Generic drug: dupilumab Inject 300 mg into the skin every 14 (fourteen) days.   EPINEPHrine 0.3 mg/0.3 mL Soaj injection Commonly known as: EPI-PEN Inject 0.3 mg into the muscle as needed for anaphylaxis.   FLUoxetine 20 MG capsule Commonly known as: PROZAC Take 40 mg by mouth every morning.   fluticasone 50 MCG/ACT nasal spray Commonly known as: FLONASE Place 2 sprays into both nostrils daily. 8 am - 11 am   furosemide 20 MG tablet Commonly known as: LASIX Take 20 mg by mouth daily. Hold for SBP<120   hydrOXYzine 25 MG tablet Commonly known as: ATARAX Take 25 mg by mouth at bedtime.   hydrOXYzine 25 MG tablet Commonly known as: ATARAX Take 25 mg by mouth as needed for anxiety.   ipratropium-albuterol 0.5-2.5 (3) MG/3ML Soln Commonly known as: DUONEB Take 3 mLs by nebulization every 6 (six) hours as needed.   lactose free nutrition Liqd Take 237 mLs by mouth as needed.   memantine 10 MG tablet Commonly known as: Namenda Take 1 tablet (10 mg total) by mouth 2 (two) times daily.   montelukast 10 MG tablet Commonly known as: SINGULAIR Take 1 tablet (10 mg total) by mouth daily.    nystatin powder Generic drug: nystatin Apply 1 Application topically 2 (two) times daily. Apply powder under both breast for 7 days if not healed then let the MD know   omeprazole 20 MG capsule Commonly known as: PRILOSEC Take 20 mg by mouth daily. 30 minutes before breakfast.   omeprazole 20 MG capsule Commonly known as: PRILOSEC Take 20 mg by mouth every evening. 30 minutes before dinner.   Perforomist 20 MCG/2ML nebulizer solution Generic drug: formoterol Take 20 mcg by nebulization 2 (two) times daily.   polyethylene glycol 17 g packet Commonly known as: MIRALAX / GLYCOLAX Take 17 g by mouth every 3 (three) days.   Systane 0.4-0.3 % Gel ophthalmic gel Generic drug: Polyethyl Glycol-Propyl Glycol Place 1 application into both eyes at bedtime.   Vitamin D 50 MCG (2000 UT) tablet Take  2,000 Units by mouth at bedtime.        Review of Systems  Unable to perform ROS: Dementia    Immunization History  Administered Date(s) Administered   Covid-19, Mrna,Vaccine(Spikevax)61yr and older 06/08/2022   Influenza Inj Mdck Quad Pf 05/18/2019, 05/07/2021   Influenza Split 04/04/2012, 05/03/2013, 05/10/2015   Influenza Whole 04/18/2008, 06/04/2009, 04/03/2010, 03/30/2011   Influenza, High Dose Seasonal PF 04/20/2018, 05/24/2020   Influenza,inj,Quad PF,6+ Mos 03/26/2017   Influenza-Unspecified 03/26/2010, 04/13/2012, 05/08/2013, 05/14/2014, 06/03/2014, 05/13/2015, 04/27/2016, 05/08/2022   Moderna Covid-19 Vaccine Bivalent Booster 152yr& up 11/28/2021   Moderna SARS-COV2 Booster Vaccination 06/13/2020, 03/25/2021, 05/14/2021   Moderna Sars-Covid-2 Vaccination 09/05/2019, 10/03/2019   Pneumococcal Conjugate-13 03/05/2014   Pneumococcal Polysaccharide-23 06/04/2009   Pneumococcal-Unspecified 03/13/2009   Respiratory Syncytial Virus Vaccine,Recomb Aduvanted(Arexvy) 07/23/2022   Td 03/13/2009, 04/13/2012   Zoster Recombinat (Shingrix) 08/03/2017, 10/01/2017   Zoster, Live  03/21/2010   Pertinent  Health Maintenance Due  Topic Date Due   INFLUENZA VACCINE  Completed   DEXA SCAN  Discontinued      12/07/2012   10:00 AM 01/11/2020    9:49 AM 09/26/2021    1:02 PM 08/19/2022    2:35 PM  Fall Risk  Falls in the past year? Yes 0 0 0  Was there an injury with Fall?  0 0 0  Fall Risk Category Calculator  0 0 0  Fall Risk Category (Retired)  Low Low   (RETIRED) Patient Fall Risk Level  Low fall risk Low fall risk   Patient at Risk for Falls Due to Impaired balance/gait  Impaired balance/gait No Fall Risks  Fall risk Follow up   Falls evaluation completed Falls evaluation completed   Functional Status Survey:    Vitals:   09/22/22 1042  BP: 128/69  Pulse: (!) 56  Resp: 17  Temp: (!) 97.5 F (36.4 C)  SpO2: 100%  Weight: 157 lb (71.2 kg)  Height: 5' 2"$  (1.575 m)   Body mass index is 28.72 kg/m. Physical Exam Vitals reviewed.  Constitutional:      General: She is not in acute distress. HENT:     Head: Normocephalic.     Right Ear: There is no impacted cerumen.     Left Ear: There is no impacted cerumen.     Nose: Nose normal.     Mouth/Throat:     Mouth: Mucous membranes are moist.  Eyes:     General:        Right eye: No discharge.        Left eye: No discharge.  Cardiovascular:     Rate and Rhythm: Normal rate and regular rhythm.     Pulses: Normal pulses.     Heart sounds: Normal heart sounds.  Pulmonary:     Effort: Pulmonary effort is normal. No respiratory distress.     Breath sounds: Normal breath sounds. No wheezing.  Abdominal:     General: Bowel sounds are normal. There is no distension.     Palpations: Abdomen is soft.     Tenderness: There is no abdominal tenderness.  Musculoskeletal:     Cervical back: Neck supple.     Right lower leg: No edema.     Left lower leg: No edema.  Skin:    General: Skin is warm and dry.     Capillary Refill: Capillary refill takes less than 2 seconds.     Comments: Non tender purple lesions  to BLE, vary in size, no skin breakdown or  drainage. Excoriated skin under breasts R>L.   Neurological:     General: No focal deficit present.     Mental Status: She is alert. Mental status is at baseline.     Motor: Weakness present.     Gait: Gait abnormal.     Comments: [place] transfer  Psychiatric:        Mood and Affect: Mood normal.     Labs reviewed: Recent Labs    01/22/22 0000 08/20/22 0000  NA 138 137  K 4.3 4.3  CL 106 105  CO2 24* 21  BUN 18 28*  CREATININE 0.8 0.9  CALCIUM 9.3 9.5   Recent Labs    01/22/22 0000 08/20/22 0000  AST 11* 17  ALT 9 12  ALKPHOS 101 100  ALBUMIN 3.3* 3.3*   Recent Labs    01/22/22 0000 08/20/22 0000  WBC 6.4 5.5  HGB 11.4* 11.1*  HCT 34* 33*  PLT 218 186   Lab Results  Component Value Date   TSH 1.52 09/23/2020   No results found for: "HGBA1C" No results found for: "CHOL", "HDL", "LDLCALC", "LDLDIRECT", "TRIG", "CHOLHDL"  Significant Diagnostic Results in last 30 days:  No results found.  Assessment/Plan 1. Candidal skin infection - excoriated skin under breasts, R>L - cont nystatin powder - start diflucan 150 mg po once  2. Essential hypertension - controlled - cont furosemide  3. Stage 3b chronic kidney disease ([place]) - avoid nephrotoxic drugs like NSAIDS and dose adjust medications to be renally excreted - encourage hydration with water   4. Late onset Alzheimer's dementia with behavioral disturbance ([place]) - sleeping more - behaviors with ADLs - cont Namenda - cont hydroxyzine prn  5. Severe persistent asthma without complication - no recent exacerbations - cont tessalon/budesonide/dextromethorphan/Duonebs/formoterol/ Dupixent/Singulair   6. Gastro-esophageal reflux disease without esophagitis - hgb stable - cont omeprazole  7. Major depressive disorder, single episode, in remission ([place]) - no mood changes - cont prozac  8. Anxiety - see above - cont hydroxyzine prn  9. Osteoporosis  without current pathological fracture, unspecified osteoporosis type - no further workup per goals of care  10. Senile purpura ([place]) - education given     Family/ staff Communication: plan discussed with patient and nurse  Labs/tests ordered: none

## 2022-09-30 ENCOUNTER — Non-Acute Institutional Stay (SKILLED_NURSING_FACILITY): Payer: Medicare Other | Admitting: Orthopedic Surgery

## 2022-09-30 ENCOUNTER — Encounter: Payer: Self-pay | Admitting: Orthopedic Surgery

## 2022-09-30 DIAGNOSIS — K1379 Other lesions of oral mucosa: Secondary | ICD-10-CM

## 2022-09-30 MED ORDER — ORAJEL 3X TOOTHACHE & GUM 20-0.26-0.15 % MT GEL: 1.0000 | Freq: Three times a day (TID) | OROMUCOSAL | 0 refills | Status: DC | PRN

## 2022-09-30 NOTE — Progress Notes (Signed)
Location:   [facility] Room Number: 121-A Place of Service:  SNF [phone]) Provider:  Windell Moulding, NP  PCP: Virgie Dad, MD  Patient Care Team: Virgie Dad, MD as PCP - General (Internal Medicine) Elsie Stain, MD (Pulmonary Disease)  Extended Emergency Contact Information Primary Emergency Contact: Earlene Plater of [place] Phone: [phone] Mobile Phone: [phone] Relation: Daughter  Code Status:  DNR Goals of care: Advanced Directive information    09/30/2022   10:56 AM  Advanced Directives  Does Patient Have a Medical Advance Directive? Yes  Type of Paramedic of [place];Living will;Out of facility DNR (pink MOST or yellow form)  Does patient want to make changes to medical advance directive? No - Patient declined  Copy of [place] in Chart? Yes - validated most recent copy scanned in chart (See row information)     Chief Complaint  Patient presents with   Acute Visit    Mouth sore.     HPI:  Pt is a 89 y.o. female seen today for an acute visit for mouth sore.   Nursing reports a small white area on tongue and back of tongue. Patient a poor historian due to dementia. She denies dental pain today. She ate breakfast and lunch without difficulty today. Afebrile. Vitals stable.    Past Medical History:  Diagnosis Date   Age-related osteoporosis without current pathological fracture    Alzheimer's disease ([place])    with late onset   Arthritis    Asthma    Deficiency of other specified B group vitamins    Depression    Environmental allergies    mold   Fall    GERD without esophagitis    Hyperlipidemia    Hypertension    Hypertensive kidney disease with CKD (chronic kidney disease)    with stage I through stage 4 CKD    Memory loss    Seasonal allergies    Unspecified osteoarthritis, unspecified site    Past Surgical History:  Procedure  Laterality Date   HIP ARTHROPLASTY  06/25/2011   Procedure: ARTHROPLASTY BIPOLAR HIP;  Surgeon: Laurice Record [place];  Location: [facility];  Service: Orthopedics;  Laterality: Right;   NASAL SINUS SURGERY     VAGINAL HYSTERECTOMY      Allergies  Allergen Reactions   Azithromycin Other (See Comments)    Nausea, weakness    Aspirin Other (See Comments)    Unknown reaction   Erythromycin    Molds & Smuts    Other     Seasonal Allergies.   Septra [Sulfamethoxazole-Trimethoprim]    Sulfa Antibiotics    Sulfonamide Derivatives Other (See Comments)    Unknown reaction    Allergies as of 09/30/2022       Reactions   Azithromycin Other (See Comments)   Nausea, weakness    Aspirin Other (See Comments)   Unknown reaction   Erythromycin    Molds & Smuts    Other    Seasonal Allergies.   Septra [sulfamethoxazole-trimethoprim]    Sulfa Antibiotics    Sulfonamide Derivatives Other (See Comments)   Unknown reaction        Medication List        Accurate as of September 30, 2022 10:56 AM. If you have any questions, ask your nurse or doctor.          STOP taking these medications    ipratropium-albuterol 0.5-2.5 (3) MG/3ML Soln Commonly known  as: DUONEB Stopped by: Yvonna Alanis, NP   nystatin powder Generic drug: nystatin Stopped by: Yvonna Alanis, NP       TAKE these medications    acetaminophen 500 MG tablet Commonly known as: TYLENOL Take 500 mg by mouth 3 (three) times daily.   benzonatate 200 MG capsule Commonly known as: TESSALON Take 1 capsule (200 mg total) by mouth 3 (three) times daily as needed for cough.   budesonide 0.5 MG/2ML nebulizer solution Commonly known as: PULMICORT Take 0.5 mg by nebulization 2 (two) times daily.   CALCIUM 600-D PO Take 1 tablet by mouth daily.   cyanocobalamin 1000 MCG tablet Commonly known as: VITAMIN B12 Take 3,000 mcg by mouth daily.   dextromethorphan 30 MG/5ML liquid Commonly known as: DELSYM Take 60 mg by mouth  every 12 (twelve) hours as needed for cough.   Dupixent 300 MG/2ML prefilled syringe Generic drug: dupilumab Inject 300 mg into the skin every 14 (fourteen) days.   EPINEPHrine 0.3 mg/0.3 mL Soaj injection Commonly known as: EPI-PEN Inject 0.3 mg into the muscle as needed for anaphylaxis.   FLUoxetine 20 MG capsule Commonly known as: PROZAC Take 40 mg by mouth every morning.   fluticasone 50 MCG/ACT nasal spray Commonly known as: FLONASE Place 2 sprays into both nostrils daily. 8 am - 11 am   furosemide 20 MG tablet Commonly known as: LASIX Take 20 mg by mouth daily. Hold for SBP<120   hydrOXYzine 25 MG tablet Commonly known as: ATARAX Take 25 mg by mouth at bedtime.   hydrOXYzine 25 MG tablet Commonly known as: ATARAX Take 25 mg by mouth as needed for anxiety.   lactose free nutrition Liqd Take 237 mLs by mouth as needed.   memantine 10 MG tablet Commonly known as: Namenda Take 1 tablet (10 mg total) by mouth 2 (two) times daily.   montelukast 10 MG tablet Commonly known as: SINGULAIR Take 1 tablet (10 mg total) by mouth daily.   omeprazole 20 MG capsule Commonly known as: PRILOSEC Take 20 mg by mouth daily. 30 minutes before breakfast.   omeprazole 20 MG capsule Commonly known as: PRILOSEC Take 20 mg by mouth every evening. 30 minutes before dinner.   Perforomist 20 MCG/2ML nebulizer solution Generic drug: formoterol Take 20 mcg by nebulization 2 (two) times daily.   polyethylene glycol 17 g packet Commonly known as: MIRALAX / GLYCOLAX Take 17 g by mouth every 3 (three) days.   Systane 0.4-0.3 % Gel ophthalmic gel Generic drug: Polyethyl Glycol-Propyl Glycol Place 1 application into both eyes at bedtime.   Vitamin D 50 MCG (2000 UT) tablet Take 2,000 Units by mouth at bedtime.        Review of Systems  Unable to perform ROS: Dementia    Immunization History  Administered Date(s) Administered   Covid-19, Mrna,Vaccine(Spikevax)45yr and older  06/08/2022   Influenza Inj Mdck Quad Pf 05/18/2019, 05/07/2021   Influenza Split 04/04/2012, 05/03/2013, 05/10/2015   Influenza Whole 04/18/2008, 06/04/2009, 04/03/2010, 03/30/2011   Influenza, High Dose Seasonal PF 04/20/2018, 05/24/2020   Influenza,inj,Quad PF,6+ Mos 03/26/2017   Influenza-Unspecified 03/26/2010, 04/13/2012, 05/08/2013, 05/14/2014, 06/03/2014, 05/13/2015, 04/27/2016, 05/08/2022   Moderna Covid-19 Vaccine Bivalent Booster 16yr& up 11/28/2021   Moderna SARS-COV2 Booster Vaccination 06/13/2020, 03/25/2021, 05/14/2021   Moderna Sars-Covid-2 Vaccination 09/05/2019, 10/03/2019   Pneumococcal Conjugate-13 03/05/2014   Pneumococcal Polysaccharide-23 06/04/2009   Pneumococcal-Unspecified 03/13/2009   Respiratory Syncytial Virus Vaccine,Recomb Aduvanted(Arexvy) 07/23/2022   Td 03/13/2009, 04/13/2012   Zoster Recombinat (Shingrix)  08/03/2017, 10/01/2017   Zoster, Live 03/21/2010   Pertinent  Health Maintenance Due  Topic Date Due   INFLUENZA VACCINE  Completed   DEXA SCAN  Discontinued      12/07/2012   10:00 AM 01/11/2020    9:49 AM 09/26/2021    1:02 PM 08/19/2022    2:35 PM  Fall Risk  Falls in the past year? Yes 0 0 0  Was there an injury with Fall?  0 0 0  Fall Risk Category Calculator  0 0 0  Fall Risk Category (Retired)  Low Low   (RETIRED) Patient Fall Risk Level  Low fall risk Low fall risk   Patient at Risk for Falls Due to Impaired balance/gait  Impaired balance/gait No Fall Risks  Fall risk Follow up   Falls evaluation completed Falls evaluation completed   Functional Status Survey:    Vitals:   09/30/22 1049  BP: 128/69  Pulse: (!) 56  Resp: 17  Temp: (!) 97.5 F (36.4 C)  SpO2: 100%  Weight: 157 lb (71.2 kg)  Height: '5\' 2"'$  (1.575 m)   Body mass index is 28.72 kg/m. Physical Exam Vitals reviewed.  Constitutional:      General: She is not in acute distress. HENT:     Head: Normocephalic.     Mouth/Throat:     Mouth: Mucous membranes are  dry. No oral lesions.     Tongue: No lesions.  Cardiovascular:     Rate and Rhythm: Normal rate and regular rhythm.     Pulses: Normal pulses.     Heart sounds: Normal heart sounds.  Pulmonary:     Effort: Pulmonary effort is normal. No respiratory distress.     Breath sounds: Normal breath sounds. No wheezing.  Neurological:     General: No focal deficit present.     Mental Status: She is alert. Mental status is at baseline.     Motor: Weakness present.     Gait: Gait abnormal.     Comments: bedbound  Psychiatric:        Mood and Affect: Mood normal.        Behavior: Behavior normal.     Labs reviewed: Recent Labs    01/22/22 0000 08/20/22 0000  NA 138 137  K 4.3 4.3  CL 106 105  CO2 24* 21  BUN 18 28*  CREATININE 0.8 0.9  CALCIUM 9.3 9.5   Recent Labs    01/22/22 0000 08/20/22 0000  AST 11* 17  ALT 9 12  ALKPHOS 101 100  ALBUMIN 3.3* 3.3*   Recent Labs    01/22/22 0000 08/20/22 0000  WBC 6.4 5.5  HGB 11.4* 11.1*  HCT 34* 33*  PLT 218 186   Lab Results  Component Value Date   TSH 1.52 09/23/2020   No results found for: "HGBA1C" No results found for: "CHOL", "HDL", "LDLCALC", "LDLDIRECT", "TRIG", "CHOLHDL"  Significant Diagnostic Results in last 30 days:  No results found.  Assessment/Plan 1. Mouth sore - mouth exam unremarkable - start Orajel 3x- apply to mouth sore TID PRN x 3 weeks     Family/ staff Communication:   Labs/tests ordered:

## 2022-10-05 ENCOUNTER — Encounter: Payer: Self-pay | Admitting: Internal Medicine

## 2022-10-05 ENCOUNTER — Non-Acute Institutional Stay (SKILLED_NURSING_FACILITY): Payer: Medicare Other | Admitting: Internal Medicine

## 2022-10-05 DIAGNOSIS — N1832 Chronic kidney disease, stage 3b: Secondary | ICD-10-CM

## 2022-10-05 DIAGNOSIS — F419 Anxiety disorder, unspecified: Secondary | ICD-10-CM | POA: Diagnosis not present

## 2022-10-05 DIAGNOSIS — F458 Other somatoform disorders: Secondary | ICD-10-CM

## 2022-10-05 DIAGNOSIS — K219 Gastro-esophageal reflux disease without esophagitis: Secondary | ICD-10-CM

## 2022-10-05 DIAGNOSIS — F02818 Dementia in other diseases classified elsewhere, unspecified severity, with other behavioral disturbance: Secondary | ICD-10-CM

## 2022-10-05 DIAGNOSIS — J4550 Severe persistent asthma, uncomplicated: Secondary | ICD-10-CM | POA: Diagnosis not present

## 2022-10-05 DIAGNOSIS — I10 Essential (primary) hypertension: Secondary | ICD-10-CM

## 2022-10-05 DIAGNOSIS — G301 Alzheimer's disease with late onset: Secondary | ICD-10-CM

## 2022-10-05 DIAGNOSIS — F325 Major depressive disorder, single episode, in full remission: Secondary | ICD-10-CM

## 2022-10-05 NOTE — Progress Notes (Signed)
Location:  [facility] Room Number: [id number] Place of Service:  SNF [phone]) Provider:  Virgie Dad, MD   Virgie Dad, MD  Patient Care Team: Virgie Dad, MD as PCP - General (Internal Medicine) Elsie Stain, MD (Pulmonary Disease)  Extended Emergency Contact Information Primary Emergency Contact: Earlene Plater of [place] Phone: [phone] Mobile Phone: [phone] Relation: Daughter  Code Status:  DNR Goals of care: Advanced Directive information    10/05/2022    3:20 PM  Advanced Directives  Does Patient Have a Medical Advance Directive? Yes  Type of Paramedic of [place];Living will;Out of facility DNR (pink MOST or yellow form)  Does patient want to make changes to medical advance directive? No - Patient declined  Copy of [place] in Chart? Yes - validated most recent copy scanned in chart (See row information)     Chief Complaint  Patient presents with   Medical Management of Chronic Issues    Patient being seen for a Rputine visit    Quality Metric Gaps    Discussed the need for Awv and Tdap vaccine     HPI:  Pt is a 89 y.o. female seen today for medical management of chronic diseases.    Lives in SNF   Patient has a history of Alzheimer's dementia dependent for her ADLs Osteoporosis and knee osteoarthritis CKD stage 3b Severe asthma Eosinophilic HLD and hypertension     She is stable.  Per nurses she is Harrel Lemon and needs help with feeding She also does Teeth Grinding sometimes loud when she gets anxious She mostly gets upset in the afternoon Her weight is mostly stable     Past Medical History:  Diagnosis Date   Age-related osteoporosis without current pathological fracture    Alzheimer's disease ([place])    with late onset   Arthritis    Asthma    Deficiency of other specified B group vitamins    Depression    Environmental  allergies    mold   Fall    GERD without esophagitis    Hyperlipidemia    Hypertension    Hypertensive kidney disease with CKD (chronic kidney disease)    with stage I through stage 4 CKD    Memory loss    Seasonal allergies    Unspecified osteoarthritis, unspecified site    Past Surgical History:  Procedure Laterality Date   HIP ARTHROPLASTY  06/25/2011   Procedure: ARTHROPLASTY BIPOLAR HIP;  Surgeon: Laurice Record [place];  Location: [facility];  Service: Orthopedics;  Laterality: Right;   NASAL SINUS SURGERY     VAGINAL HYSTERECTOMY      Allergies  Allergen Reactions   Azithromycin Other (See Comments)    Nausea, weakness    Aspirin Other (See Comments)    Unknown reaction   Erythromycin    Molds & Smuts    Other     Seasonal Allergies.   Septra [Sulfamethoxazole-Trimethoprim]    Sulfa Antibiotics    Sulfonamide Derivatives Other (See Comments)    Unknown reaction    Outpatient Encounter Medications as of 10/05/2022  Medication Sig   acetaminophen (TYLENOL) 500 MG tablet Take 500 mg by mouth 3 (three) times daily.   Benzocaine-Menthol-Zinc Cl (ORAJEL 3X TOOTHACHE & GUM) 20-0.26-0.15 % GEL Use as directed 1 Application in the mouth or throat 3 (three) times daily as needed.   benzonatate (TESSALON) 200 MG capsule Take 1 capsule (200 mg total) by  mouth 3 (three) times daily as needed for cough.   budesonide (PULMICORT) 0.5 MG/2ML nebulizer solution Take 0.5 mg by nebulization 2 (two) times daily.   Calcium Carb-Cholecalciferol (CALCIUM 600-D PO) Take 1 tablet by mouth daily.   Cholecalciferol (VITAMIN D) 2000 units tablet Take 2,000 Units by mouth at bedtime.   dextromethorphan (DELSYM) 30 MG/5ML liquid Take 60 mg by mouth every 12 (twelve) hours as needed for cough.   dupilumab (DUPIXENT) 300 MG/2ML prefilled syringe Inject 300 mg into the skin every 14 (fourteen) days.   EPINEPHrine 0.3 mg/0.3 mL IJ SOAJ injection Inject 0.3 mg into the muscle as needed for anaphylaxis.    FLUoxetine (PROZAC) 20 MG capsule Take 40 mg by mouth every morning.   fluticasone (FLONASE) 50 MCG/ACT nasal spray Place 2 sprays into both nostrils daily. 8 am - 11 am   formoterol (PERFOROMIST) 20 MCG/2ML nebulizer solution Take 20 mcg by nebulization 2 (two) times daily.   furosemide (LASIX) 20 MG tablet Take 20 mg by mouth daily. Hold for SBP<120   hydrOXYzine (ATARAX) 25 MG tablet Take 25 mg by mouth at bedtime.   hydrOXYzine (ATARAX) 25 MG tablet Take 25 mg by mouth as needed for anxiety.   ipratropium-albuterol (DUONEB) 0.5-2.5 (3) MG/3ML SOLN Take 3 mLs by nebulization.   memantine (NAMENDA) 10 MG tablet Take 1 tablet (10 mg total) by mouth 2 (two) times daily.   montelukast (SINGULAIR) 10 MG tablet Take 1 tablet (10 mg total) by mouth daily.   omeprazole (PRILOSEC) 20 MG capsule Take 20 mg by mouth daily. 30 minutes before breakfast.   omeprazole (PRILOSEC) 20 MG capsule Take 20 mg by mouth every evening. 30 minutes before dinner.   Polyethyl Glycol-Propyl Glycol (SYSTANE) 0.4-0.3 % GEL ophthalmic gel Place 1 application into both eyes at bedtime.   polyethylene glycol (MIRALAX / GLYCOLAX) packet Take 17 g by mouth every 3 (three) days.   vitamin B-12 (CYANOCOBALAMIN) 1000 MCG tablet Take 3,000 mcg by mouth daily.   lactose free nutrition (BOOST) LIQD Take 237 mLs by mouth as needed.   [DISCONTINUED] rivaroxaban (XARELTO) 10 MG TABS tablet Take 1 tablet (10 mg total) by mouth daily.   No facility-administered encounter medications on file as of 10/05/2022.    Review of Systems  Unable to perform ROS: Dementia    Immunization History  Administered Date(s) Administered   Covid-19, Mrna,Vaccine(Spikevax)16yr and older 06/08/2022   Influenza Inj Mdck Quad Pf 05/18/2019, 05/07/2021   Influenza Split 04/04/2012, 05/03/2013, 05/10/2015   Influenza Whole 04/18/2008, 06/04/2009, 04/03/2010, 03/30/2011   Influenza, High Dose Seasonal PF 04/20/2018, 05/24/2020   Influenza,inj,Quad PF,6+  Mos 03/26/2017   Influenza-Unspecified 03/26/2010, 04/13/2012, 05/08/2013, 05/14/2014, 06/03/2014, 05/13/2015, 04/27/2016, 05/08/2022   Moderna Covid-19 Vaccine Bivalent Booster 140yr& up 11/28/2021   Moderna SARS-COV2 Booster Vaccination 06/13/2020, 03/25/2021, 05/14/2021   Moderna Sars-Covid-2 Vaccination 09/05/2019, 10/03/2019   Pneumococcal Conjugate-13 03/05/2014   Pneumococcal Polysaccharide-23 06/04/2009   Pneumococcal-Unspecified 03/13/2009   Respiratory Syncytial Virus Vaccine,Recomb Aduvanted(Arexvy) 07/23/2022   Td 03/13/2009, 04/13/2012   Zoster Recombinat (Shingrix) 08/03/2017, 10/01/2017   Zoster, Live 03/21/2010   Pertinent  Health Maintenance Due  Topic Date Due   INFLUENZA VACCINE  Completed   DEXA SCAN  Discontinued      12/07/2012   10:00 AM 01/11/2020    9:49 AM 09/26/2021    1:02 PM 08/19/2022    2:35 PM  Fall Risk  Falls in the past year? Yes 0 0 0  Was there an injury with  Fall?  0 0 0  Fall Risk Category Calculator  0 0 0  Fall Risk Category (Retired)  Low Low   (RETIRED) Patient Fall Risk Level  Low fall risk Low fall risk   Patient at Risk for Falls Due to Impaired balance/gait  Impaired balance/gait No Fall Risks  Fall risk Follow up   Falls evaluation completed Falls evaluation completed   Functional Status Survey:    Vitals:   10/05/22 1459  BP: 128/69  Pulse: (!) 56  Resp: 17  Temp: (!) 97.5 F (36.4 C)  TempSrc: Temporal  SpO2: 100%  Weight: 157 lb (71.2 kg)  Height: '5\' 2"'$  (1.575 m)   Body mass index is 28.72 kg/m. Physical Exam Vitals reviewed.  Constitutional:      Appearance: Normal appearance.  HENT:     Head: Normocephalic.     Nose: Nose normal.     Mouth/Throat:     Mouth: Mucous membranes are moist.     Pharynx: Oropharynx is clear.  Eyes:     Pupils: Pupils are equal, round, and reactive to light.  Cardiovascular:     Rate and Rhythm: Normal rate and regular rhythm.     Pulses: Normal pulses.     Heart sounds: Normal  heart sounds. No murmur heard. Pulmonary:     Effort: Pulmonary effort is normal.     Breath sounds: Normal breath sounds.  Abdominal:     General: Abdomen is flat. Bowel sounds are normal.     Palpations: Abdomen is soft.  Musculoskeletal:        General: Swelling present.     Cervical back: Neck supple.  Skin:    General: Skin is warm.  Neurological:     General: No focal deficit present.     Mental Status: She is alert.  Psychiatric:        Mood and Affect: Mood normal.        Thought Content: Thought content normal.     Labs reviewed: Recent Labs    01/22/22 0000 08/20/22 0000  NA 138 137  K 4.3 4.3  CL 106 105  CO2 24* 21  BUN 18 28*  CREATININE 0.8 0.9  CALCIUM 9.3 9.5   Recent Labs    01/22/22 0000 08/20/22 0000  AST 11* 17  ALT 9 12  ALKPHOS 101 100  ALBUMIN 3.3* 3.3*   Recent Labs    01/22/22 0000 08/20/22 0000  WBC 6.4 5.5  HGB 11.4* 11.1*  HCT 34* 33*  PLT 218 186   Lab Results  Component Value Date   TSH 1.52 09/23/2020   No results found for: "HGBA1C" No results found for: "CHOL", "HDL", "LDLCALC", "LDLDIRECT", "TRIG", "CHOLHDL"  Significant Diagnostic Results in last 30 days:  No results found.  Assessment/Plan 1. Teeth grinding Patient can not use Mouth Guard due to her Cognition It is usually related to her Anxiety Will try Vistaril PRN for it  2. Late onset Alzheimer's dementia with behavioral disturbance ([place]) On Namenda Use Vistaril Prn for Behaviors  3. Essential hypertension On Lasix  4. Stage 3b chronic kidney disease ([facility]) Creat Stable  5. Severe persistent asthma without complication Dupixent Flonase and Performist Doing well Follows with Pulmonary  6. Gastro-esophageal reflux disease without esophagitis Prilosec BID for her Asthma  7. Major depressive disorder, single episode, in remission ([place]) Prozac  8. Anxiety Vistaril    Family/ staff Communication:   Labs/tests ordered:

## 2022-10-08 ENCOUNTER — Non-Acute Institutional Stay (SKILLED_NURSING_FACILITY): Payer: Medicare Other | Admitting: Adult Health

## 2022-10-08 DIAGNOSIS — G309 Alzheimer's disease, unspecified: Secondary | ICD-10-CM | POA: Diagnosis not present

## 2022-10-08 DIAGNOSIS — F0282 Dementia in other diseases classified elsewhere, unspecified severity, with psychotic disturbance: Secondary | ICD-10-CM | POA: Diagnosis not present

## 2022-10-09 ENCOUNTER — Encounter: Payer: Self-pay | Admitting: Adult Health

## 2022-10-09 MED ORDER — QUETIAPINE FUMARATE 25 MG PO TABS: 12.5000 mg | ORAL_TABLET | Freq: Every day | ORAL | 0 refills | Status: DC
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
## 2022-11-10 ENCOUNTER — Non-Acute Institutional Stay (SKILLED_NURSING_FACILITY): Payer: Medicare Other | Admitting: Orthopedic Surgery

## 2022-11-10 ENCOUNTER — Encounter: Payer: Self-pay | Admitting: Orthopedic Surgery

## 2022-11-10 DIAGNOSIS — N1832 Chronic kidney disease, stage 3b: Secondary | ICD-10-CM

## 2022-11-10 DIAGNOSIS — F325 Major depressive disorder, single episode, in full remission: Secondary | ICD-10-CM | POA: Diagnosis not present

## 2022-11-10 DIAGNOSIS — I10 Essential (primary) hypertension: Secondary | ICD-10-CM

## 2022-11-10 DIAGNOSIS — F419 Anxiety disorder, unspecified: Secondary | ICD-10-CM | POA: Diagnosis not present

## 2022-11-10 DIAGNOSIS — F458 Other somatoform disorders: Secondary | ICD-10-CM

## 2022-11-10 DIAGNOSIS — B372 Candidiasis of skin and nail: Secondary | ICD-10-CM | POA: Diagnosis not present

## 2022-11-10 DIAGNOSIS — J4550 Severe persistent asthma, uncomplicated: Secondary | ICD-10-CM

## 2022-11-10 DIAGNOSIS — K219 Gastro-esophageal reflux disease without esophagitis: Secondary | ICD-10-CM

## 2022-11-10 DIAGNOSIS — F0282 Dementia in other diseases classified elsewhere, unspecified severity, with psychotic disturbance: Secondary | ICD-10-CM | POA: Diagnosis not present

## 2022-11-10 DIAGNOSIS — G309 Alzheimer's disease, unspecified: Secondary | ICD-10-CM

## 2022-11-10 MED ORDER — HYDROXYZINE HCL 25 MG PO TABS: 25.0000 mg | ORAL_TABLET | Freq: Two times a day (BID) | ORAL | 0 refills | Status: DC | PRN

## 2022-11-10 MED ORDER — QUETIAPINE FUMARATE 25 MG PO TABS: 12.5000 mg | ORAL_TABLET | Freq: Every day | ORAL | 0 refills | Status: DC

## 2022-11-10 NOTE — Progress Notes (Signed)
[facility] Room Number: 121-A Place of Service:  SNF [phone]) Provider:  Hazle Nordmann, NP  PCP: Mahlon Gammon, MD  Patient Care Team: Mahlon Gammon, MD as PCP - General (Internal Medicine) Storm Frisk, MD (Pulmonary Disease)  Extended Emergency Contact Information Primary Emergency Contact: Mel Almond of [place] Home Phone: [phone] Mobile Phone: [phone] Relation: Daughter  Code Status:  DNR Goals of care: Advanced Directive information    11/10/2022    9:47 AM  Advanced Directives  Does Patient Have a Medical Advance Directive? Yes  Type of Estate agent of [place];Living will;Out of facility DNR (pink MOST or yellow form)  Does patient want to make changes to medical advance directive? No - Patient declined  Copy of Healthcare Power of Attorney in Chart? Yes - validated most recent copy scanned in chart (See row information)     Chief Complaint  Patient presents with   Medical Management of Chronic Issues    Routine Visit.   Immunizations    Discuss the need for DTAP vaccine.    Health Maintenance    Discuss the need for Annual Wellness Visit.    HPI:  Pt is a 89 y.o. female seen today for medical management of chronic diseases.    She currently resides on the skilled nursing unit at [facility]. PMH: HTN, allergic rhinitis, asthma, GERD, Alzheimer's, OA, osteoporosis, CKD 3b, and depression.   HTN- BUN/creat 28/0.9 08/20/2022, remains in lasix daily  CKD- see above Alzheimer's with delusions- CT head 2018 chronic ischemic microvascular disease, non ambulatory, dependent with ADLs, Seroquel started for teeth grinding and increased agitation> still needing Vistaril prn> using daily, remains on Namenda and vistaril prn Teeth grinding- see above, scheduled to see Dentist later this month Asthma- followed by Dr. Judeth Horn, remains on  tessalon/budesonide/dextromethorphan/Duonebs/formoterol/ Dupixent/Singulair GERD- hgb 11.1 08/20/2022, remains on Prilosec Depression/Anxiety- no recent mood changes, Na+ 137 08/20/2022, remains on Prozac and vistaril prn Candida skin infection- skin under breasts improved, remains on Nystatin powder daily for prevention  Recent weights:  04/01- 159.8 lbs  03/05- 163.6 lbs  02/01- 157 lbs  11/01/2021- 158 lbs  Recent blood pressures:  04/07- 133/75  03/29- 135/83  03/24- 133/74  04/01-     Past Medical History:  Diagnosis Date   Age-related osteoporosis without current pathological fracture    Alzheimer's disease    with late onset   Arthritis    Asthma    Deficiency of other specified B group vitamins    Depression    Environmental allergies    mold   Fall    GERD without esophagitis    Hyperlipidemia    Hypertension    Hypertensive kidney disease with CKD (chronic kidney disease)    with stage I through stage 4 CKD    Memory loss    Seasonal allergies    Unspecified osteoarthritis, unspecified site    Past Surgical History:  Procedure Laterality Date   HIP ARTHROPLASTY  06/25/2011   Procedure: ARTHROPLASTY BIPOLAR HIP;  Surgeon: Illene Labrador Aplington;  Location: [facility];  Service: Orthopedics;  Laterality: Right;   NASAL SINUS SURGERY     VAGINAL HYSTERECTOMY      Allergies  Allergen Reactions   Azithromycin Other (See Comments)    Nausea, weakness    Aspirin Other (See Comments)    Unknown reaction   Erythromycin    Molds & Smuts    Other  Seasonal Allergies.   Septra [Sulfamethoxazole-Trimethoprim]    Sulfa Antibiotics    Sulfonamide Derivatives Other (See Comments)    Unknown reaction    Allergies as of 11/10/2022       Reactions   Azithromycin Other (See Comments)   Nausea, weakness    Aspirin Other (See Comments)   Unknown reaction   Erythromycin    Molds & Smuts    Other    Seasonal Allergies.   Septra [sulfamethoxazole-trimethoprim]     Sulfa Antibiotics    Sulfonamide Derivatives Other (See Comments)   Unknown reaction        Medication List        Accurate as of November 10, 2022  9:47 AM. If you have any questions, ask your nurse or doctor.          STOP taking these medications    EPINEPHrine 0.3 mg/0.3 mL Soaj injection Commonly known as: EPI-PEN Stopped by: Octavia Heir, NP   lactose free nutrition Liqd Stopped by: Octavia Heir, NP       TAKE these medications    acetaminophen 500 MG tablet Commonly known as: TYLENOL Take 500 mg by mouth 3 (three) times daily.   benzonatate 200 MG capsule Commonly known as: TESSALON Take 1 capsule (200 mg total) by mouth 3 (three) times daily as needed for cough.   budesonide 0.5 MG/2ML nebulizer solution Commonly known as: PULMICORT Take 0.5 mg by nebulization 2 (two) times daily.   CALCIUM 600-D PO Take 1 tablet by mouth daily.   cyanocobalamin 1000 MCG tablet Commonly known as: VITAMIN B12 Take 3,000 mcg by mouth daily.   dextromethorphan 30 MG/5ML liquid Commonly known as: DELSYM Take 60 mg by mouth every 12 (twelve) hours as needed for cough.   Dupixent 300 MG/2ML prefilled syringe Generic drug: dupilumab Inject 300 mg into the skin every 14 (fourteen) days.   FLUoxetine 20 MG capsule Commonly known as: PROZAC Take 40 mg by mouth every morning.   fluticasone 50 MCG/ACT nasal spray Commonly known as: FLONASE Place 2 sprays into both nostrils daily. 8 am - 11 am   furosemide 20 MG tablet Commonly known as: LASIX Take 20 mg by mouth daily. Hold for SBP<120   hydrOXYzine 25 MG tablet Commonly known as: ATARAX Take 25 mg by mouth at bedtime.   hydrOXYzine 25 MG tablet Commonly known as: ATARAX Take 25 mg by mouth as needed for anxiety.   ipratropium-albuterol 0.5-2.5 (3) MG/3ML Soln Commonly known as: DUONEB Take 3 mLs by nebulization every 6 (six) hours as needed.   memantine 10 MG tablet Commonly known as: Namenda Take 1 tablet  (10 mg total) by mouth 2 (two) times daily.   montelukast 10 MG tablet Commonly known as: SINGULAIR Take 1 tablet (10 mg total) by mouth daily.   nystatin powder Commonly known as: MYCOSTATIN/NYSTOP Apply 1 Application topically every morning.   omeprazole 20 MG capsule Commonly known as: PRILOSEC Take 20 mg by mouth daily. 30 minutes before breakfast.   omeprazole 20 MG capsule Commonly known as: PRILOSEC Take 20 mg by mouth every evening. 30 minutes before dinner.   Orajel 3X Toothache & Gum 20-0.26-0.15 % Gel Generic drug: Benzocaine-Menthol-Zinc Cl Use as directed 1 Application in the mouth or throat 3 (three) times daily as needed.   Perforomist 20 MCG/2ML nebulizer solution Generic drug: formoterol Take 20 mcg by nebulization 2 (two) times daily.   polyethylene glycol 17 g packet Commonly known as: MIRALAX / GLYCOLAX  Take 17 g by mouth every 3 (three) days.   QUEtiapine 25 MG tablet Commonly known as: SEROQUEL Take 12.5 mg by mouth 2 (two) times daily. What changed: Another medication with the same name was removed. Continue taking this medication, and follow the directions you see here. Changed by: Octavia Heir, NP   Systane 0.4-0.3 % Gel ophthalmic gel Generic drug: Polyethyl Glycol-Propyl Glycol Place 1 application into both eyes at bedtime.   Vitamin D 50 MCG (2000 UT) tablet Take 2,000 Units by mouth at bedtime.        Review of Systems  Unable to perform ROS: Dementia    Immunization History  Administered Date(s) Administered   Covid-19, Mrna,Vaccine(Spikevax)67yrs and older 06/08/2022   Influenza Inj Mdck Quad Pf 05/18/2019, 05/07/2021   Influenza Split 04/04/2012, 05/03/2013, 05/10/2015   Influenza Whole 04/18/2008, 06/04/2009, 04/03/2010, 03/30/2011   Influenza, High Dose Seasonal PF 04/20/2018, 05/24/2020   Influenza,inj,Quad PF,6+ Mos 03/26/2017   Influenza-Unspecified 03/26/2010, 04/13/2012, 05/08/2013, 05/14/2014, 06/03/2014, 05/13/2015,  04/27/2016, 05/08/2022   Moderna Covid-19 Vaccine Bivalent Booster 6yrs & up 11/28/2021   Moderna SARS-COV2 Booster Vaccination 06/13/2020, 03/25/2021, 05/14/2021   Moderna Sars-Covid-2 Vaccination 09/05/2019, 10/03/2019   Pneumococcal Conjugate-13 03/05/2014   Pneumococcal Polysaccharide-23 06/04/2009   Pneumococcal-Unspecified 03/13/2009   Respiratory Syncytial Virus Vaccine,Recomb Aduvanted(Arexvy) 07/23/2022   Td 03/13/2009, 04/13/2012   Zoster Recombinat (Shingrix) 08/03/2017, 10/01/2017   Zoster, Live 03/21/2010   Pertinent  Health Maintenance Due  Topic Date Due   INFLUENZA VACCINE  03/04/2023   DEXA SCAN  Discontinued      12/07/2012   10:00 AM 01/11/2020    9:49 AM 09/26/2021    1:02 PM 08/19/2022    2:35 PM  Fall Risk  Falls in the past year? Yes 0 0 0  Was there an injury with Fall?  0 0 0  Fall Risk Category Calculator  0 0 0  Fall Risk Category (Retired)  Low Low   (RETIRED) Patient Fall Risk Level  Low fall risk Low fall risk   Patient at Risk for Falls Due to Impaired balance/gait  Impaired balance/gait No Fall Risks  Fall risk Follow up   Falls evaluation completed Falls evaluation completed   Functional Status Survey:    Vitals:   11/10/22 0939  BP: 133/75  Pulse: 64  Resp: 17  Temp: (!) 97.5 F (36.4 C)  SpO2: 100%  Weight: 159 lb 12.8 oz (72.5 kg)  Height: 5\' 2"  (1.575 m)   Body mass index is 29.23 kg/m. Physical Exam Vitals reviewed.  Constitutional:      General: She is not in acute distress. HENT:     Head: Normocephalic.     Right Ear: There is no impacted cerumen.     Left Ear: There is no impacted cerumen.     Ears:     Comments: Right hearing aid    Nose: Nose normal.     Mouth/Throat:     Mouth: Mucous membranes are moist.  Eyes:     General:        Right eye: No discharge.        Left eye: No discharge.  Cardiovascular:     Rate and Rhythm: Normal rate and regular rhythm.     Pulses: Normal pulses.     Heart sounds: Normal  heart sounds.  Pulmonary:     Effort: Pulmonary effort is normal. No respiratory distress.     Breath sounds: Normal breath sounds. No wheezing or rales.  Abdominal:  General: Bowel sounds are normal.     Palpations: Abdomen is soft.  Musculoskeletal:     Cervical back: Neck supple.     Right lower leg: Edema present.     Left lower leg: Edema present.     Comments: Non pitting  Skin:    General: Skin is warm and dry.     Capillary Refill: Capillary refill takes less than 2 seconds.  Neurological:     General: No focal deficit present.     Mental Status: She is alert. Mental status is at baseline.     Motor: Weakness present.     Gait: Gait abnormal.     Comments: hoyer  Psychiatric:        Mood and Affect: Mood normal.     Comments: Very pleasant, aphasia, alert to self and familiar face     Labs reviewed: Recent Labs    01/22/22 0000 08/20/22 0000  NA 138 137  K 4.3 4.3  CL 106 105  CO2 24* 21  BUN 18 28*  CREATININE 0.8 0.9  CALCIUM 9.3 9.5   Recent Labs    01/22/22 0000 08/20/22 0000  AST 11* 17  ALT 9 12  ALKPHOS 101 100  ALBUMIN 3.3* 3.3*   Recent Labs    01/22/22 0000 08/20/22 0000  WBC 6.4 5.5  HGB 11.4* 11.1*  HCT 34* 33*  PLT 218 186   Lab Results  Component Value Date   TSH 1.52 09/23/2020   No results found for: "HGBA1C" No results found for: "CHOL", "HDL", "LDLCALC", "LDLDIRECT", "TRIG", "CHOLHDL"  Significant Diagnostic Results in last 30 days:  No results found.  Assessment/Plan 1. Essential hypertension - controlled with furosemide  2. Stage 3b chronic kidney disease - avoid nephrotoxic drugs like NSAIDS and dose adjust medications to be renally excreted - encourage hydration with water   3. Dementia in Alzheimer's disease with delusions - increased agitation and teeth grinding  - Seroquel started last month> still using vistaril prn almost daily - will increase Seroquel to 25 mg po qhs - discontinue vistaril 25 mg po  qhs - increase vistaril 25 mg to po BID prn x 10 days> give usage report to provider   4. Teeth grinding - ongoing - see above - schedule to see dentist this month  5. Severe persistent asthma without complication - no recent exacerbations - cont remains on tessalon, budesonide, dextromethorphan, Duonebs, formoterol, Dupixent, Singulair   6. Gastro-esophageal reflux disease without esophagitis - hgb stable - cont omeprazole  7. Major depressive disorder, single episode, in remission - no mood changes - cont Wellbutrin  8. Anxiety - no panic attacks - cont vistaril prn  9. Candidal skin infection - improved - cont nystatin powder to skin folds daily for prevention    Family/ staff Communication: plan discussed with patient, daughter and nurse  Labs/tests ordered: none

## 2022-11-13 ENCOUNTER — Encounter: Payer: Self-pay | Admitting: Adult Health

## 2022-11-13 DIAGNOSIS — R293 Abnormal posture: Secondary | ICD-10-CM | POA: Diagnosis not present

## 2022-11-13 DIAGNOSIS — M24541 Contracture, right hand: Secondary | ICD-10-CM | POA: Diagnosis not present

## 2022-11-13 DIAGNOSIS — M6259 Muscle wasting and atrophy, not elsewhere classified, multiple sites: Secondary | ICD-10-CM | POA: Diagnosis not present

## 2022-11-13 DIAGNOSIS — R1312 Dysphagia, oropharyngeal phase: Secondary | ICD-10-CM | POA: Diagnosis not present

## 2022-11-13 DIAGNOSIS — G301 Alzheimer's disease with late onset: Secondary | ICD-10-CM | POA: Diagnosis not present

## 2022-11-13 DIAGNOSIS — M6389 Disorders of muscle in diseases classified elsewhere, multiple sites: Secondary | ICD-10-CM | POA: Diagnosis not present

## 2022-11-13 DIAGNOSIS — R278 Other lack of coordination: Secondary | ICD-10-CM | POA: Diagnosis not present

## 2022-11-13 DIAGNOSIS — R41841 Cognitive communication deficit: Secondary | ICD-10-CM | POA: Diagnosis not present

## 2022-11-13 DIAGNOSIS — M24542 Contracture, left hand: Secondary | ICD-10-CM | POA: Diagnosis not present

## 2022-11-13 DIAGNOSIS — R531 Weakness: Secondary | ICD-10-CM | POA: Diagnosis not present

## 2022-11-16 NOTE — Progress Notes (Signed)
This encounter was created in error - please disregard.

## 2022-11-17 DIAGNOSIS — G301 Alzheimer's disease with late onset: Secondary | ICD-10-CM | POA: Diagnosis not present

## 2022-11-17 DIAGNOSIS — R41841 Cognitive communication deficit: Secondary | ICD-10-CM | POA: Diagnosis not present

## 2022-11-17 DIAGNOSIS — R531 Weakness: Secondary | ICD-10-CM | POA: Diagnosis not present

## 2022-11-17 DIAGNOSIS — M6259 Muscle wasting and atrophy, not elsewhere classified, multiple sites: Secondary | ICD-10-CM | POA: Diagnosis not present

## 2022-11-17 DIAGNOSIS — M24542 Contracture, left hand: Secondary | ICD-10-CM | POA: Diagnosis not present

## 2022-11-17 DIAGNOSIS — M24541 Contracture, right hand: Secondary | ICD-10-CM | POA: Diagnosis not present

## 2022-11-18 DIAGNOSIS — R41841 Cognitive communication deficit: Secondary | ICD-10-CM | POA: Diagnosis not present

## 2022-11-18 DIAGNOSIS — M6259 Muscle wasting and atrophy, not elsewhere classified, multiple sites: Secondary | ICD-10-CM | POA: Diagnosis not present

## 2022-11-18 DIAGNOSIS — R531 Weakness: Secondary | ICD-10-CM | POA: Diagnosis not present

## 2022-11-18 DIAGNOSIS — G301 Alzheimer's disease with late onset: Secondary | ICD-10-CM | POA: Diagnosis not present

## 2022-11-18 DIAGNOSIS — M24542 Contracture, left hand: Secondary | ICD-10-CM | POA: Diagnosis not present

## 2022-11-18 DIAGNOSIS — M24541 Contracture, right hand: Secondary | ICD-10-CM | POA: Diagnosis not present

## 2022-11-20 DIAGNOSIS — G301 Alzheimer's disease with late onset: Secondary | ICD-10-CM | POA: Diagnosis not present

## 2022-11-20 DIAGNOSIS — M24541 Contracture, right hand: Secondary | ICD-10-CM | POA: Diagnosis not present

## 2022-11-20 DIAGNOSIS — M6259 Muscle wasting and atrophy, not elsewhere classified, multiple sites: Secondary | ICD-10-CM | POA: Diagnosis not present

## 2022-11-20 DIAGNOSIS — M24542 Contracture, left hand: Secondary | ICD-10-CM | POA: Diagnosis not present

## 2022-11-20 DIAGNOSIS — R41841 Cognitive communication deficit: Secondary | ICD-10-CM | POA: Diagnosis not present

## 2022-11-20 DIAGNOSIS — R531 Weakness: Secondary | ICD-10-CM | POA: Diagnosis not present

## 2022-11-23 DIAGNOSIS — M24542 Contracture, left hand: Secondary | ICD-10-CM | POA: Diagnosis not present

## 2022-11-23 DIAGNOSIS — G301 Alzheimer's disease with late onset: Secondary | ICD-10-CM | POA: Diagnosis not present

## 2022-11-23 DIAGNOSIS — R41841 Cognitive communication deficit: Secondary | ICD-10-CM | POA: Diagnosis not present

## 2022-11-23 DIAGNOSIS — M6259 Muscle wasting and atrophy, not elsewhere classified, multiple sites: Secondary | ICD-10-CM | POA: Diagnosis not present

## 2022-11-23 DIAGNOSIS — M24541 Contracture, right hand: Secondary | ICD-10-CM | POA: Diagnosis not present

## 2022-11-23 DIAGNOSIS — R531 Weakness: Secondary | ICD-10-CM | POA: Diagnosis not present

## 2022-11-24 DIAGNOSIS — R41841 Cognitive communication deficit: Secondary | ICD-10-CM | POA: Diagnosis not present

## 2022-11-24 DIAGNOSIS — G301 Alzheimer's disease with late onset: Secondary | ICD-10-CM | POA: Diagnosis not present

## 2022-11-24 DIAGNOSIS — R531 Weakness: Secondary | ICD-10-CM | POA: Diagnosis not present

## 2022-11-24 DIAGNOSIS — M6259 Muscle wasting and atrophy, not elsewhere classified, multiple sites: Secondary | ICD-10-CM | POA: Diagnosis not present

## 2022-11-24 DIAGNOSIS — M24542 Contracture, left hand: Secondary | ICD-10-CM | POA: Diagnosis not present

## 2022-11-24 DIAGNOSIS — M24541 Contracture, right hand: Secondary | ICD-10-CM | POA: Diagnosis not present

## 2022-11-25 DIAGNOSIS — G301 Alzheimer's disease with late onset: Secondary | ICD-10-CM | POA: Diagnosis not present

## 2022-11-25 DIAGNOSIS — R531 Weakness: Secondary | ICD-10-CM | POA: Diagnosis not present

## 2022-11-25 DIAGNOSIS — R41841 Cognitive communication deficit: Secondary | ICD-10-CM | POA: Diagnosis not present

## 2022-11-25 DIAGNOSIS — M24542 Contracture, left hand: Secondary | ICD-10-CM | POA: Diagnosis not present

## 2022-11-25 DIAGNOSIS — M24541 Contracture, right hand: Secondary | ICD-10-CM | POA: Diagnosis not present

## 2022-11-25 DIAGNOSIS — M6259 Muscle wasting and atrophy, not elsewhere classified, multiple sites: Secondary | ICD-10-CM | POA: Diagnosis not present

## 2022-11-27 ENCOUNTER — Encounter: Payer: Self-pay | Admitting: Adult Health

## 2022-11-27 ENCOUNTER — Non-Acute Institutional Stay (INDEPENDENT_AMBULATORY_CARE_PROVIDER_SITE_OTHER): Payer: Medicare Other | Admitting: Adult Health

## 2022-11-27 DIAGNOSIS — R41841 Cognitive communication deficit: Secondary | ICD-10-CM | POA: Diagnosis not present

## 2022-11-27 DIAGNOSIS — Z0000 Encounter for general adult medical examination without abnormal findings: Secondary | ICD-10-CM | POA: Diagnosis not present

## 2022-11-27 DIAGNOSIS — M24542 Contracture, left hand: Secondary | ICD-10-CM | POA: Diagnosis not present

## 2022-11-27 DIAGNOSIS — M24541 Contracture, right hand: Secondary | ICD-10-CM | POA: Diagnosis not present

## 2022-11-27 DIAGNOSIS — G301 Alzheimer's disease with late onset: Secondary | ICD-10-CM | POA: Diagnosis not present

## 2022-11-27 DIAGNOSIS — M6259 Muscle wasting and atrophy, not elsewhere classified, multiple sites: Secondary | ICD-10-CM | POA: Diagnosis not present

## 2022-11-27 DIAGNOSIS — R531 Weakness: Secondary | ICD-10-CM | POA: Diagnosis not present

## 2022-11-27 NOTE — Progress Notes (Signed)
Subjective:   [name] is a 89 y.o. female who presents for Medicare Annual (Subsequent) preventive examination at [facility]   Review of Systems     Cardiac Risk Factors include: advanced age (>33men, >28 women);hypertension;sedentary lifestyle     Objective:    Today's Vitals   11/27/22 1022  Weight: 159 lb 12.8 oz (72.5 kg)   Body mass index is 29.23 kg/m.     11/27/2022   10:31 AM 11/13/2022   12:02 PM 11/10/2022    9:47 AM 10/05/2022    3:20 PM 09/30/2022   10:56 AM 09/22/2022   10:48 AM 08/19/2022    2:35 PM  Advanced Directives  Does Patient Have a Medical Advance Directive? No;Yes Yes Yes Yes Yes Yes Yes  Type of Estate agent of [place];Out of facility DNR (pink MOST or yellow form);Living will Healthcare Power of [place];Living will;Out of facility DNR (pink MOST or yellow form) Healthcare Power of [place];Living will;Out of facility DNR (pink MOST or yellow form) Healthcare Power of [place];Living will;Out of facility DNR (pink MOST or yellow form) Healthcare Power of [place];Living will;Out of facility DNR (pink MOST or yellow form) Healthcare Power of [place];Living will;Out of facility DNR (pink MOST or yellow form) Healthcare Power of [place];Living will;Out of facility DNR (pink MOST or yellow form)  Does patient want to make changes to medical advance directive?  No - Patient declined No - Patient declined No - Patient declined No - Patient declined No - Patient declined No - Patient declined  Copy of Healthcare Power of Attorney in Chart? Yes - validated most recent copy scanned in chart (See row information) Yes - validated most recent copy scanned in chart (See row information) Yes - validated most recent copy scanned in chart (See row information) Yes - validated most recent copy scanned in chart (See row information) Yes - validated most recent copy scanned in chart (See row information) Yes - validated most recent  copy scanned in chart (See row information) Yes - validated most recent copy scanned in chart (See row information)  Pre-existing out of facility DNR order (yellow form or pink MOST form) Yellow form placed in chart (order not valid for inpatient use)          Current Medications (verified) Outpatient Encounter Medications as of 11/27/2022  Medication Sig   acetaminophen (TYLENOL) 500 MG tablet Take 500 mg by mouth 3 (three) times daily.   Benzocaine-Menthol-Zinc Cl (ORAJEL 3X TOOTHACHE & GUM) 20-0.26-0.15 % GEL Use as directed 1 Application in the mouth or throat 3 (three) times daily as needed. (Patient not taking: Reported on 11/13/2022)   benzonatate (TESSALON) 200 MG capsule Take 1 capsule (200 mg total) by mouth 3 (three) times daily as needed for cough.   budesonide (PULMICORT) 0.5 MG/2ML nebulizer solution Take 0.5 mg by nebulization 2 (two) times daily.   Calcium Carb-Cholecalciferol (CALCIUM 600-D PO) Take 1 tablet by mouth daily.   Cholecalciferol (VITAMIN D) 2000 units tablet Take 2,000 Units by mouth at bedtime.   dextromethorphan (DELSYM) 30 MG/5ML liquid Take 60 mg by mouth every 12 (twelve) hours as needed for cough.   dupilumab (DUPIXENT) 300 MG/2ML prefilled syringe Inject 300 mg into the skin every 14 (fourteen) days.   FLUoxetine (PROZAC) 20 MG capsule Take 40 mg by mouth every morning.   fluticasone (FLONASE) 50 MCG/ACT nasal spray Place 2 sprays into both nostrils daily. 8 am - 11 am   formoterol (PERFOROMIST) 20 MCG/2ML  nebulizer solution Take 20 mcg by nebulization 2 (two) times daily.   furosemide (LASIX) 20 MG tablet Take 20 mg by mouth daily. Hold for SBP<120   hydrOXYzine (ATARAX) 25 MG tablet Take 1 tablet (25 mg total) by mouth 2 (two) times daily as needed for anxiety.   ipratropium-albuterol (DUONEB) 0.5-2.5 (3) MG/3ML SOLN Take 3 mLs by nebulization every 6 (six) hours as needed.   memantine (NAMENDA) 10 MG tablet Take 1 tablet (10 mg total) by mouth 2 (two) times  daily.   montelukast (SINGULAIR) 10 MG tablet Take 1 tablet (10 mg total) by mouth daily.   nystatin (MYCOSTATIN/NYSTOP) powder Apply 1 Application topically every morning.   omeprazole (PRILOSEC) 20 MG capsule Take 20 mg by mouth daily. 30 minutes before breakfast.   omeprazole (PRILOSEC) 20 MG capsule Take 20 mg by mouth every evening. 30 minutes before dinner.   Polyethyl Glycol-Propyl Glycol (SYSTANE) 0.4-0.3 % GEL ophthalmic gel Place 1 application into both eyes at bedtime.   polyethylene glycol (MIRALAX / GLYCOLAX) packet Take 17 g by mouth every 3 (three) days.   QUEtiapine (SEROQUEL) 25 MG tablet Take 12.5 mg by mouth every morning.   QUEtiapine (SEROQUEL) 25 MG tablet Take 0.5 tablets (12.5 mg total) by mouth at bedtime.   vitamin B-12 (CYANOCOBALAMIN) 1000 MCG tablet Take 3,000 mcg by mouth daily.   [DISCONTINUED] rivaroxaban (XARELTO) 10 MG TABS tablet Take 1 tablet (10 mg total) by mouth daily.   No facility-administered encounter medications on file as of 11/27/2022.    Allergies (verified) Azithromycin, Aspirin, Erythromycin, Molds & smuts, Other, Septra [sulfamethoxazole-trimethoprim], Sulfa antibiotics, and Sulfonamide derivatives   History: Past Medical History:  Diagnosis Date   Age-related osteoporosis without current pathological fracture    Alzheimer's disease (HCC)    with late onset   Arthritis    Asthma    Deficiency of other specified B group vitamins    Depression    Environmental allergies    mold   Fall    GERD without esophagitis    Hyperlipidemia    Hypertension    Hypertensive kidney disease with CKD (chronic kidney disease)    with stage I through stage 4 CKD    Memory loss    Seasonal allergies    Unspecified osteoarthritis, unspecified site    Past Surgical History:  Procedure Laterality Date   HIP ARTHROPLASTY  06/25/2011   Procedure: ARTHROPLASTY BIPOLAR HIP;  Surgeon: Illene Labrador [place];  Location: [facility];  Service: Orthopedics;   Laterality: Right;   NASAL SINUS SURGERY     VAGINAL HYSTERECTOMY     Family History  Problem Relation Age of Onset   Asthma Other    Colon cancer Father    Dementia Neg Hx    Social History   Socioeconomic History   Marital status: Married    Spouse name: Not on file   Number of children: 1   Years of education: 16   Highest education level: Not on file  Occupational History   Occupation: retired    Associate Professor: RETIRED  Tobacco Use   Smoking status: Never   Smokeless tobacco: Never  Vaping Use   Vaping Use: Never used  Substance and Sexual Activity   Alcohol use: Yes    Alcohol/week: 2.0 standard drinks of alcohol    Types: 2 Glasses of wine per week    Comment: 2 glasses daily   Drug use: No   Sexual activity: Not on file  Other Topics Concern  Not on file  Social History Narrative   Originally from [place]. She previously lived in [place]. Previously was a Runner, broadcasting/film/video. No pets currently. No bird exposure. No recent mold exposure. Previous hold did have mold.       [facility] at [facility]: skilled nursing area    Caffeine use: 1 cup coffee /day   Social Determinants of Health   Financial Resource Strain: Not on file  Food Insecurity: Not on file  Transportation Needs: Not on file  Physical Activity: Not on file  Stress: Not on file  Social Connections: Not on file    Tobacco Counseling Counseling given: Not Answered   Clinical Intake:  Pre-visit preparation completed: No  Pain : No/denies pain     BMI - recorded: 29.34 Nutritional Status: BMI 25 -29 Overweight Nutritional Risks: None Diabetes: No  How often do you need to have someone help you when you read instructions, pamphlets, or other written materials from your doctor or pharmacy?: 5 - Always  Diabetic?no  Interpreter Needed?: No  Comments: difficulty answering questions. Information entered by :: Fletcher Anon NP  no Activities of Daily Living    11/27/2022   10:45 AM  In  your present state of health, do you have any difficulty performing the following activities:  Hearing? 1  Vision? 1  Difficulty concentrating or making decisions? 1  Walking or climbing stairs? 1  Dressing or bathing? 1  Doing errands, shopping? 1  Preparing Food and eating ? Y  Using the Toilet? Y  In the past six months, have you accidently leaked urine? Y  Do you have problems with loss of bowel control? Y  Managing your Medications? Y  Managing your Finances? Y  Housekeeping or managing your Housekeeping? Y    Patient Care Team: Mahlon Gammon, MD as PCP - General (Internal Medicine) Storm Frisk, MD (Pulmonary Disease)  Indicate any recent Medical Services you may have received from other than [facility] providers in the past year (date may be approximate).     Assessment:   This is a routine wellness examination for Raeann.  Hearing/Vision screen No results found.  Dietary issues and exercise activities discussed: Current Exercise Habits: The patient does not participate in regular exercise at present, Exercise limited by: orthopedic condition(s)   Goals Addressed   None    Depression Screen    11/27/2022   10:29 AM 08/19/2022    2:35 PM 09/26/2021    1:02 PM  PHQ 2/9 Scores  PHQ - 2 Score 2 0   PHQ- 9 Score 8    Exception Documentation   Other- indicate reason in comment box  Not completed   pt confused    Fall Risk    11/27/2022   10:29 AM 08/19/2022    2:35 PM 09/26/2021    1:02 PM 01/11/2020    9:49 AM 12/07/2012   10:00 AM  Fall Risk   Falls in the past year? 0 0 0 0 Yes  Number falls in past yr: 0 0 0 0 2 or more  Injury with Fall? 0 0 0 0   Risk Factor Category      High Fall Risk  Risk for fall due to : History of fall(s) No Fall Risks Impaired balance/gait  Impaired balance/gait  Follow up Falls evaluation completed Falls evaluation completed Falls evaluation completed      FALL RISK PREVENTION PERTAINING TO THE HOME:  Any stairs in or around  the home? No  If so, are there any without handrails? No  Home free of loose throw rugs in walkways, pet beds, electrical cords, etc? Yes  Adequate lighting in your home to reduce risk of falls? Yes   ASSISTIVE DEVICES UTILIZED TO PREVENT FALLS:  Life alert? No  Use of a cane, walker or w/c? Yes  Grab bars in the bathroom? Yes  Shower chair or bench in shower? Yes  Elevated toilet seat or a handicapped toilet? Yes   TIMED UP AND GO:  Was the test performed? No .  Length of time to ambulate 10 feet: not ambulatory  sec.   Gait unsteady without use of assistive device, provider informed and interventions were implemented  Cognitive Function:    09/26/2021    1:03 PM 09/20/2018    2:10 PM 09/14/2017    2:19 PM  MMSE - Mini Mental State Exam  Orientation to time 2 1 0  Orientation to Place 4 5 5   Registration 3 3 3   Attention/ Calculation 0 0 5  Recall 0 0 2  Language- name 2 objects 2 2 2   Language- repeat 1 1 1   Language- follow 3 step command 0 3 3  Language- read & follow direction 1 1 1   Write a sentence 0 1 1  Copy design 0 0 0  Total score 13 17 23       09/08/2016   12:13 PM 03/04/2016   11:08 AM 09/04/2015   10:44 AM  Montreal Cognitive Assessment   Visuospatial/ Executive (0/5) 4 1 1   Naming (0/3) 3 3 2   Attention: Read list of digits (0/2) 2 2 2   Attention: Read list of letters (0/1) 1 1 1   Attention: Serial 7 subtraction starting at 100 (0/3) 1 1 3   Language: Repeat phrase (0/2) 2 2 2   Language : Fluency (0/1) 1 1 1   Abstraction (0/2) 2 1 1   Delayed Recall (0/5) 0 0 0  Orientation (0/6) 3 4 4   Total 19 16 17   Adjusted Score (based on education) 19 16 18       Immunizations Immunization History  Administered Date(s) Administered   Covid-19, Mrna,Vaccine(Spikevax)38yrs and older 06/08/2022   Influenza Inj Mdck Quad Pf 05/18/2019, 05/07/2021   Influenza Split 04/04/2012, 05/03/2013, 05/10/2015   Influenza Whole 04/18/2008, 06/04/2009, 04/03/2010,  03/30/2011   Influenza, High Dose Seasonal PF 04/20/2018, 05/24/2020   Influenza,inj,Quad PF,6+ Mos 03/26/2017   Influenza-Unspecified 03/26/2010, 04/13/2012, 05/08/2013, 05/14/2014, 06/03/2014, 05/13/2015, 04/27/2016, 05/08/2022   Moderna Covid-19 Vaccine Bivalent Booster 73yrs & up 11/28/2021   Moderna SARS-COV2 Booster Vaccination 06/13/2020, 03/25/2021, 05/14/2021   Moderna Sars-Covid-2 Vaccination 09/05/2019, 10/03/2019   Pneumococcal Conjugate-13 03/05/2014   Pneumococcal Polysaccharide-23 06/04/2009   Pneumococcal-Unspecified 03/13/2009   Respiratory Syncytial Virus Vaccine,Recomb Aduvanted(Arexvy) 07/23/2022   Td 03/13/2009, 04/13/2012   Zoster Recombinat (Shingrix) 08/03/2017, 10/01/2017   Zoster, Live 03/21/2010    TDAP status: Due, Education has been provided regarding the importance of this vaccine. Advised may receive this vaccine at local pharmacy or Health Dept. Aware to provide a copy of the vaccination record if obtained from local pharmacy or Health Dept. Verbalized acceptance and understanding.  Flu Vaccine status: Up to date  Pneumococcal vaccine status: Up to date  Covid-19 vaccine status: Completed vaccines  Qualifies for Shingles Vaccine? Yes   Zostavax completed Yes   Shingrix Completed?: Yes  Screening Tests Health Maintenance  Topic Date Due   DTaP/Tdap/Td (3 - Tdap) 04/13/2022   INFLUENZA VACCINE  03/04/2023   Medicare Annual Wellness (AWV)  11/27/2023   Pneumonia Vaccine 68+ Years old  Completed   COVID-19 Vaccine  Completed   Zoster Vaccines- Shingrix  Completed   HPV VACCINES  Aged Out   DEXA SCAN  Discontinued    Health Maintenance  Health Maintenance Due  Topic Date Due   DTaP/Tdap/Td (3 - Tdap) 04/13/2022    Colorectal cancer screening: No longer required.   Mammogram status: No longer required due to age.  Bone Density status: Ordered not ordered due to age and dementia. Pt provided with contact info and advised to call to schedule  appt.  Lung Cancer Screening: (Low Dose CT Chest recommended if Age 54-80 years, 30 pack-year currently smoking OR have quit w/in 15years.) does not qualify.   Lung Cancer Screening Referral: na  Additional Screening:  Hepatitis C Screening: does not qualify; Completed na  Vision Screening: Recommended annual ophthalmology exams for early detection of glaucoma and other disorders of the eye. Is the patient up to date with their annual eye exam?  No  Who is the provider or what is the name of the office in which the patient attends annual eye exams? na If pt is not established with a provider, would they like to be referred to a provider to establish care? No . Has dementia and behaviors.   Dental Screening: Recommended annual dental exams for proper oral hygiene  Community Resource Referral / Chronic Care Management: CRR required this visit?  No   CCM required this visit?  No      Plan:     I have personally reviewed and noted the following in the patient's chart:   Medical and social history Use of alcohol, tobacco or illicit drugs  Current medications and supplements including opioid prescriptions. Patient is not currently taking opioid prescriptions. Functional ability and status Nutritional status Physical activity Advanced directives List of other physicians Hospitalizations, surgeries, and ER visits in previous 12 months Vitals Screenings to include cognitive, depression, and falls Referrals and appointments  In addition, I have reviewed and discussed with patient certain preventive protocols, quality metrics, and best practice recommendations. A written personalized care plan for preventive services as well as general preventive health recommendations were provided to patient.     Fletcher Anon, NP   11/27/2022   Nurse Notes: not able to do memory test

## 2022-11-27 NOTE — Patient Instructions (Signed)
[name] , Thank you for taking time to come for your Medicare Wellness Visit. I appreciate your ongoing commitment to your health goals. Please review the following plan we discussed and let me know if I can assist you in the future.   Screening recommendations/referrals: Colonoscopy aged out Mammogram aged out Bone Density not ambulatory, would not benefit Recommended yearly ophthalmology/optometry visit for glaucoma screening and checkup Recommended yearly dental visit for hygiene and checkup  Vaccinations: Influenza vaccine- due annually in September/October Pneumococcal vaccine up to date Tdap vaccine ordered Shingles vaccine up to date    Advanced directives: reviewed  Conditions/risks identified: fall risk   Next appointment: 1 year   Preventive Care 41 Years and Older, Female Preventive care refers to lifestyle choices and visits with your health care provider that can promote health and wellness. What does preventive care include? A yearly physical exam. This is also called an annual well check. Dental exams once or twice a year. Routine eye exams. Ask your health care provider how often you should have your eyes checked. Personal lifestyle choices, including: Daily care of your teeth and gums. Regular physical activity. Eating a healthy diet. Avoiding tobacco and drug use. Limiting alcohol use. Practicing safe sex. Taking low-dose aspirin every day. Taking vitamin and mineral supplements as recommended by your health care provider. What happens during an annual well check? The services and screenings done by your health care provider during your annual well check will depend on your age, overall health, lifestyle risk factors, and family history of disease. Counseling  Your health care provider may ask you questions about your: Alcohol use. Tobacco use. Drug use. Emotional well-being. Home and relationship well-being. Sexual activity. Eating  habits. History of falls. Memory and ability to understand (cognition). Work and work Astronomer. Reproductive health. Screening  You may have the following tests or measurements: Height, weight, and BMI. Blood pressure. Lipid and cholesterol levels. These may be checked every 5 years, or more frequently if you are over 53 years old. Skin check. Lung cancer screening. You may have this screening every year starting at age 31 if you have a 30-pack-year history of smoking and currently smoke or have quit within the past 15 years. Fecal occult blood test (FOBT) of the stool. You may have this test every year starting at age 95. Flexible sigmoidoscopy or colonoscopy. You may have a sigmoidoscopy every 5 years or a colonoscopy every 10 years starting at age 50. Hepatitis C blood test. Hepatitis B blood test. Sexually transmitted disease (STD) testing. Diabetes screening. This is done by checking your blood sugar (glucose) after you have not eaten for a while (fasting). You may have this done every 1-3 years. Bone density scan. This is done to screen for osteoporosis. You may have this done starting at age 58. Mammogram. This may be done every 1-2 years. Talk to your health care provider about how often you should have regular mammograms. Talk with your health care provider about your test results, treatment options, and if necessary, the need for more tests. Vaccines  Your health care provider may recommend certain vaccines, such as: Influenza vaccine. This is recommended every year. Tetanus, diphtheria, and acellular pertussis (Tdap, Td) vaccine. You may need a Td booster every 10 years. Zoster vaccine. You may need this after age 11. Pneumococcal 13-valent conjugate (PCV13) vaccine. One dose is recommended after age 57. Pneumococcal polysaccharide (PPSV23) vaccine. One dose is recommended after age 42. Talk to your health care provider about  which screenings and vaccines you need and how  often you need them. This information is not intended to replace advice given to you by your health care provider. Make sure you discuss any questions you have with your health care provider. Document Released: 08/16/2015 Document Revised: 04/08/2016 Document Reviewed: 05/21/2015 Elsevier Interactive Patient Education  2017 [place] Prevention in the Home Falls can cause injuries. They can happen to people of all ages. There are many things you can do to make your home safe and to help prevent falls. What can I do on the outside of my home? Regularly fix the edges of walkways and driveways and fix any cracks. Remove anything that might make you trip as you walk through a door, such as a raised step or threshold. Trim any bushes or trees on the path to your home. Use bright outdoor lighting. Clear any walking paths of anything that might make someone trip, such as rocks or tools. Regularly check to see if handrails are loose or broken. Make sure that both sides of any steps have handrails. Any raised decks and porches should have guardrails on the edges. Have any leaves, snow, or ice cleared regularly. Use sand or salt on walking paths during winter. Clean up any spills in your garage right away. This includes oil or grease spills. What can I do in the bathroom? Use night lights. Install grab bars by the toilet and in the tub and shower. Do not use towel bars as grab bars. Use non-skid mats or decals in the tub or shower. If you need to sit down in the shower, use a plastic, non-slip stool. Keep the floor dry. Clean up any water that spills on the floor as soon as it happens. Remove soap buildup in the tub or shower regularly. Attach bath mats securely with double-sided non-slip rug tape. Do not have throw rugs and other things on the floor that can make you trip. What can I do in the bedroom? Use night lights. Make sure that you have a light by your bed that is easy to  reach. Do not use any sheets or blankets that are too big for your bed. They should not hang down onto the floor. Have a firm chair that has side arms. You can use this for support while you get dressed. Do not have throw rugs and other things on the floor that can make you trip. What can I do in the kitchen? Clean up any spills right away. Avoid walking on wet floors. Keep items that you use a lot in easy-to-reach places. If you need to reach something above you, use a strong step stool that has a grab bar. Keep electrical cords out of the way. Do not use floor polish or wax that makes floors slippery. If you must use wax, use non-skid floor wax. Do not have throw rugs and other things on the floor that can make you trip. What can I do with my stairs? Do not leave any items on the stairs. Make sure that there are handrails on both sides of the stairs and use them. Fix handrails that are broken or loose. Make sure that handrails are as long as the stairways. Check any carpeting to make sure that it is firmly attached to the stairs. Fix any carpet that is loose or worn. Avoid having throw rugs at the top or bottom of the stairs. If you do have throw rugs, attach them to the floor with  carpet tape. Make sure that you have a light switch at the top of the stairs and the bottom of the stairs. If you do not have them, ask someone to add them for you. What else can I do to help prevent falls? Wear shoes that: Do not have high heels. Have rubber bottoms. Are comfortable and fit you well. Are closed at the toe. Do not wear sandals. If you use a stepladder: Make sure that it is fully opened. Do not climb a closed stepladder. Make sure that both sides of the stepladder are locked into place. Ask someone to hold it for you, if possible. Clearly mark and make sure that you can see: Any grab bars or handrails. First and last steps. Where the edge of each step is. Use tools that help you move  around (mobility aids) if they are needed. These include: Canes. Walkers. Scooters. Crutches. Turn on the lights when you go into a dark area. Replace any light bulbs as soon as they burn out. Set up your furniture so you have a clear path. Avoid moving your furniture around. If any of your floors are uneven, fix them. If there are any pets around you, be aware of where they are. Review your medicines with your doctor. Some medicines can make you feel dizzy. This can increase your chance of falling. Ask your doctor what other things that you can do to help prevent falls. This information is not intended to replace advice given to you by your health care provider. Make sure you discuss any questions you have with your health care provider. Document Released: 05/16/2009 Document Revised: 12/26/2015 Document Reviewed: 08/24/2014 Elsevier Interactive Patient Education  2017 [employer].

## 2022-11-30 DIAGNOSIS — M6259 Muscle wasting and atrophy, not elsewhere classified, multiple sites: Secondary | ICD-10-CM | POA: Diagnosis not present

## 2022-11-30 DIAGNOSIS — G301 Alzheimer's disease with late onset: Secondary | ICD-10-CM | POA: Diagnosis not present

## 2022-11-30 DIAGNOSIS — M24541 Contracture, right hand: Secondary | ICD-10-CM | POA: Diagnosis not present

## 2022-11-30 DIAGNOSIS — M24542 Contracture, left hand: Secondary | ICD-10-CM | POA: Diagnosis not present

## 2022-11-30 DIAGNOSIS — R41841 Cognitive communication deficit: Secondary | ICD-10-CM | POA: Diagnosis not present

## 2022-11-30 DIAGNOSIS — R531 Weakness: Secondary | ICD-10-CM | POA: Diagnosis not present

## 2022-12-01 DIAGNOSIS — M24542 Contracture, left hand: Secondary | ICD-10-CM | POA: Diagnosis not present

## 2022-12-01 DIAGNOSIS — G301 Alzheimer's disease with late onset: Secondary | ICD-10-CM | POA: Diagnosis not present

## 2022-12-01 DIAGNOSIS — M24541 Contracture, right hand: Secondary | ICD-10-CM | POA: Diagnosis not present

## 2022-12-01 DIAGNOSIS — M6259 Muscle wasting and atrophy, not elsewhere classified, multiple sites: Secondary | ICD-10-CM | POA: Diagnosis not present

## 2022-12-01 DIAGNOSIS — R41841 Cognitive communication deficit: Secondary | ICD-10-CM | POA: Diagnosis not present

## 2022-12-01 DIAGNOSIS — R531 Weakness: Secondary | ICD-10-CM | POA: Diagnosis not present

## 2022-12-02 DIAGNOSIS — M24542 Contracture, left hand: Secondary | ICD-10-CM | POA: Diagnosis not present

## 2022-12-02 DIAGNOSIS — R278 Other lack of coordination: Secondary | ICD-10-CM | POA: Diagnosis not present

## 2022-12-02 DIAGNOSIS — M6389 Disorders of muscle in diseases classified elsewhere, multiple sites: Secondary | ICD-10-CM | POA: Diagnosis not present

## 2022-12-02 DIAGNOSIS — R1312 Dysphagia, oropharyngeal phase: Secondary | ICD-10-CM | POA: Diagnosis not present

## 2022-12-02 DIAGNOSIS — R293 Abnormal posture: Secondary | ICD-10-CM | POA: Diagnosis not present

## 2022-12-02 DIAGNOSIS — M24541 Contracture, right hand: Secondary | ICD-10-CM | POA: Diagnosis not present

## 2022-12-02 DIAGNOSIS — R41841 Cognitive communication deficit: Secondary | ICD-10-CM | POA: Diagnosis not present

## 2022-12-02 DIAGNOSIS — G301 Alzheimer's disease with late onset: Secondary | ICD-10-CM | POA: Diagnosis not present

## 2022-12-04 DIAGNOSIS — M24542 Contracture, left hand: Secondary | ICD-10-CM | POA: Diagnosis not present

## 2022-12-04 DIAGNOSIS — G301 Alzheimer's disease with late onset: Secondary | ICD-10-CM | POA: Diagnosis not present

## 2022-12-04 DIAGNOSIS — M24541 Contracture, right hand: Secondary | ICD-10-CM | POA: Diagnosis not present

## 2022-12-04 DIAGNOSIS — R293 Abnormal posture: Secondary | ICD-10-CM | POA: Diagnosis not present

## 2022-12-04 DIAGNOSIS — R41841 Cognitive communication deficit: Secondary | ICD-10-CM | POA: Diagnosis not present

## 2022-12-04 DIAGNOSIS — R1312 Dysphagia, oropharyngeal phase: Secondary | ICD-10-CM | POA: Diagnosis not present

## 2022-12-07 ENCOUNTER — Encounter: Payer: Self-pay | Admitting: Adult Health

## 2022-12-07 ENCOUNTER — Non-Acute Institutional Stay (SKILLED_NURSING_FACILITY): Payer: Medicare Other | Admitting: Adult Health

## 2022-12-07 DIAGNOSIS — R41841 Cognitive communication deficit: Secondary | ICD-10-CM | POA: Diagnosis not present

## 2022-12-07 DIAGNOSIS — I10 Essential (primary) hypertension: Secondary | ICD-10-CM | POA: Diagnosis not present

## 2022-12-07 DIAGNOSIS — J4540 Moderate persistent asthma, uncomplicated: Secondary | ICD-10-CM

## 2022-12-07 DIAGNOSIS — K219 Gastro-esophageal reflux disease without esophagitis: Secondary | ICD-10-CM | POA: Diagnosis not present

## 2022-12-07 DIAGNOSIS — M24541 Contracture, right hand: Secondary | ICD-10-CM | POA: Diagnosis not present

## 2022-12-07 DIAGNOSIS — G301 Alzheimer's disease with late onset: Secondary | ICD-10-CM

## 2022-12-07 DIAGNOSIS — F02818 Dementia in other diseases classified elsewhere, unspecified severity, with other behavioral disturbance: Secondary | ICD-10-CM | POA: Diagnosis not present

## 2022-12-07 DIAGNOSIS — N1832 Chronic kidney disease, stage 3b: Secondary | ICD-10-CM | POA: Diagnosis not present

## 2022-12-07 DIAGNOSIS — R293 Abnormal posture: Secondary | ICD-10-CM | POA: Diagnosis not present

## 2022-12-07 DIAGNOSIS — F458 Other somatoform disorders: Secondary | ICD-10-CM | POA: Diagnosis not present

## 2022-12-07 DIAGNOSIS — R1312 Dysphagia, oropharyngeal phase: Secondary | ICD-10-CM | POA: Diagnosis not present

## 2022-12-07 DIAGNOSIS — M24542 Contracture, left hand: Secondary | ICD-10-CM | POA: Diagnosis not present

## 2022-12-07 MED ORDER — METHOCARBAMOL 500 MG PO TABS: 250.0000 mg | ORAL_TABLET | Freq: Two times a day (BID) | ORAL | 0 refills | Status: DC

## 2022-12-07 NOTE — Progress Notes (Signed)
Location:  [facility] Room Number: 121A Place of Service:  SNF [phone]) Provider:  Tamsen Roers, MD  Patient Care Team: Mahlon Gammon, MD as PCP - General (Internal Medicine) Storm Frisk, MD (Pulmonary Disease)  Extended Emergency Contact Information Primary Emergency Contact: Mel Almond of [place] Home Phone: [phone] Mobile Phone: [phone] Relation: Daughter  Code Status:  DNR Goals of care: Advanced Directive information    12/07/2022   11:25 AM  Advanced Directives  Does Patient Have a Medical Advance Directive? Yes  Type of Estate agent of [place];Out of facility DNR (pink MOST or yellow form);Living will  Does patient want to make changes to medical advance directive? No - Patient declined  Copy of Healthcare Power of Attorney in Chart? Yes - validated most recent copy scanned in chart (See row information)  Pre-existing out of facility DNR order (yellow form or pink MOST form) Yellow form placed in chart (order not valid for inpatient use)     Chief Complaint  Patient presents with   Medical Management of Chronic Issues    Patient is being seen for a routine visit.    HPI:  Pt is a 89 y.o. female seen today for medical management of chronic diseases.    PMH significant for asthma, OP, arthritis, Vit d deficiency, depression, dementia, HTN, HLD, CKD hyperlipidemia, seasonal allergies, OA, and GERD.   Nurse reports seroquel is not helping with bruxism. Caregiver reports this symptom can occur anytime of the day but particularly when she is agitated. She tends to get more confused in the evening and sleep late in the morning.   She requires a hoyer lift for transfers, help with feeing, bathing, and dressing. Also she is incontinent  In regards to her asthma she is not having any cough or wheeze. Seems fairly stable. Past Medical History:  Diagnosis  Date   Age-related osteoporosis without current pathological fracture    Alzheimer's disease (HCC)    with late onset   Arthritis    Asthma    Deficiency of other specified B group vitamins    Depression    Environmental allergies    mold   Fall    GERD without esophagitis    Hyperlipidemia    Hypertension    Hypertensive kidney disease with CKD (chronic kidney disease)    with stage I through stage 4 CKD    Memory loss    Seasonal allergies    Unspecified osteoarthritis, unspecified site    Past Surgical History:  Procedure Laterality Date   HIP ARTHROPLASTY  06/25/2011   Procedure: ARTHROPLASTY BIPOLAR HIP;  Surgeon: Illene Labrador [place];  Location: [facility];  Service: Orthopedics;  Laterality: Right;   NASAL SINUS SURGERY     VAGINAL HYSTERECTOMY      Allergies  Allergen Reactions   Azithromycin Other (See Comments)    Nausea, weakness    Aspirin Other (See Comments)    Unknown reaction   Erythromycin    Molds & Smuts    Other     Seasonal Allergies.   Septra [Sulfamethoxazole-Trimethoprim]    Sulfa Antibiotics    Sulfonamide Derivatives Other (See Comments)    Unknown reaction    Outpatient Encounter Medications as of 12/07/2022  Medication Sig   acetaminophen (TYLENOL) 500 MG tablet Take 500 mg by mouth 3 (three) times daily.   Benzocaine-Menthol-Zinc Cl (ORAJEL 3X TOOTHACHE & GUM) 20-0.26-0.15 % GEL Use as directed 1  Application in the mouth or throat 3 (three) times daily as needed.   benzonatate (TESSALON) 200 MG capsule Take 1 capsule (200 mg total) by mouth 3 (three) times daily as needed for cough.   budesonide (PULMICORT) 0.5 MG/2ML nebulizer solution Take 0.5 mg by nebulization 2 (two) times daily.   Calcium Carb-Cholecalciferol (CALCIUM 600-D PO) Take 1 tablet by mouth daily.   Cholecalciferol (VITAMIN D) 2000 units tablet Take 2,000 Units by mouth at bedtime.   dextromethorphan (DELSYM) 30 MG/5ML liquid Take 60 mg by mouth every 12 (twelve) hours as needed  for cough.   dupilumab (DUPIXENT) 300 MG/2ML prefilled syringe Inject 300 mg into the skin every 14 (fourteen) days.   FLUoxetine (PROZAC) 20 MG capsule Take 40 mg by mouth every morning.   fluticasone (FLONASE) 50 MCG/ACT nasal spray Place 2 sprays into both nostrils daily. 8 am - 11 am   formoterol (PERFOROMIST) 20 MCG/2ML nebulizer solution Take 20 mcg by nebulization 2 (two) times daily.   furosemide (LASIX) 20 MG tablet Take 20 mg by mouth daily. Hold for SBP<120   hydrOXYzine (ATARAX) 25 MG tablet Take 1 tablet (25 mg total) by mouth 2 (two) times daily as needed for anxiety.   ipratropium-albuterol (DUONEB) 0.5-2.5 (3) MG/3ML SOLN Take 3 mLs by nebulization every 6 (six) hours as needed.   memantine (NAMENDA) 10 MG tablet Take 1 tablet (10 mg total) by mouth 2 (two) times daily.   montelukast (SINGULAIR) 10 MG tablet Take 1 tablet (10 mg total) by mouth daily.   nystatin (MYCOSTATIN/NYSTOP) powder Apply 1 Application topically every morning.   omeprazole (PRILOSEC) 20 MG capsule Take 20 mg by mouth daily. 30 minutes before breakfast.   omeprazole (PRILOSEC) 20 MG capsule Take 20 mg by mouth every evening. 30 minutes before dinner.   Polyethyl Glycol-Propyl Glycol (SYSTANE) 0.4-0.3 % GEL ophthalmic gel Place 1 application into both eyes at bedtime.   polyethylene glycol (MIRALAX / GLYCOLAX) packet Take 17 g by mouth every 3 (three) days.   QUEtiapine (SEROQUEL) 25 MG tablet Take 12.5 mg by mouth every morning.   QUEtiapine (SEROQUEL) 25 MG tablet Take 0.5 tablets (12.5 mg total) by mouth at bedtime.   vitamin B-12 (CYANOCOBALAMIN) 1000 MCG tablet Take 3,000 mcg by mouth daily.   [DISCONTINUED] rivaroxaban (XARELTO) 10 MG TABS tablet Take 1 tablet (10 mg total) by mouth daily.   No facility-administered encounter medications on file as of 12/07/2022.    Review of Systems  Unable to perform ROS: Dementia    Immunization History  Administered Date(s) Administered   Covid-19,  Mrna,Vaccine(Spikevax)71yrs and older 06/08/2022   Influenza Inj Mdck Quad Pf 05/18/2019, 05/07/2021   Influenza Split 04/04/2012, 05/03/2013, 05/10/2015   Influenza Whole 04/18/2008, 06/04/2009, 04/03/2010, 03/30/2011   Influenza, High Dose Seasonal PF 04/20/2018, 05/24/2020   Influenza,inj,Quad PF,6+ Mos 03/26/2017   Influenza-Unspecified 03/26/2010, 04/13/2012, 05/08/2013, 05/14/2014, 06/03/2014, 05/13/2015, 04/27/2016, 05/08/2022   Moderna Covid-19 Vaccine Bivalent Booster 40yrs & up 11/28/2021   Moderna SARS-COV2 Booster Vaccination 06/13/2020, 03/25/2021, 05/14/2021   Moderna Sars-Covid-2 Vaccination 09/05/2019, 10/03/2019   Pneumococcal Conjugate-13 03/05/2014   Pneumococcal Polysaccharide-23 06/04/2009   Pneumococcal-Unspecified 03/13/2009   Respiratory Syncytial Virus Vaccine,Recomb Aduvanted(Arexvy) 07/23/2022   Td 03/13/2009, 04/13/2012   Tdap 12/01/2022   Zoster Recombinat (Shingrix) 08/03/2017, 10/01/2017   Zoster, Live 03/21/2010   Pertinent  Health Maintenance Due  Topic Date Due   INFLUENZA VACCINE  03/04/2023   DEXA SCAN  Discontinued      12/07/2012   10:00  AM 01/11/2020    9:49 AM 09/26/2021    1:02 PM 08/19/2022    2:35 PM 11/27/2022   10:29 AM  Fall Risk  Falls in the past year? Yes 0 0 0 0  Was there an injury with Fall?  0 0 0 0  Fall Risk Category Calculator  0 0 0 0  Fall Risk Category (Retired)  Low Low    (RETIRED) Patient Fall Risk Level  Low fall risk Low fall risk    Patient at Risk for Falls Due to Impaired balance/gait  Impaired balance/gait No Fall Risks History of fall(s)  Fall risk Follow up   Falls evaluation completed Falls evaluation completed Falls evaluation completed   Functional Status Survey:    Vitals:   12/07/22 1122  BP: (!) 151/58  Pulse: 60  Resp: 16  Temp: 98.4 F (36.9 C)  TempSrc: Temporal  SpO2: 97%  Weight: 161 lb 3.2 oz (73.1 kg)  Height: 5\' 2"  (1.575 m)   Body mass index is 29.48 kg/m. Physical Exam Vitals and  nursing note reviewed.  Constitutional:      General: She is not in acute distress.    Appearance: She is not diaphoretic.  HENT:     Head: Normocephalic and atraumatic.     Nose: Nose normal.     Mouth/Throat:     Mouth: Mucous membranes are moist.     Pharynx: Oropharynx is clear.  Neck:     Vascular: No JVD.  Cardiovascular:     Rate and Rhythm: Normal rate and regular rhythm.     Heart sounds: No murmur heard. Pulmonary:     Effort: Pulmonary effort is normal. No respiratory distress.     Breath sounds: Normal breath sounds. No wheezing.  Abdominal:     General: Bowel sounds are normal. There is no distension.     Palpations: Abdomen is soft.     Tenderness: There is no abdominal tenderness.  Skin:    General: Skin is warm and dry.  Neurological:     General: No focal deficit present.     Mental Status: She is alert. Mental status is at baseline.     Labs reviewed: Recent Labs    01/22/22 0000 08/20/22 0000  NA 138 137  K 4.3 4.3  CL 106 105  CO2 24* 21  BUN 18 28*  CREATININE 0.8 0.9  CALCIUM 9.3 9.5   Recent Labs    01/22/22 0000 08/20/22 0000  AST 11* 17  ALT 9 12  ALKPHOS 101 100  ALBUMIN 3.3* 3.3*   Recent Labs    01/22/22 0000 08/20/22 0000  WBC 6.4 5.5  HGB 11.4* 11.1*  HCT 34* 33*  PLT 218 186   Lab Results  Component Value Date   TSH 1.52 09/23/2020   No results found for: "HGBA1C" No results found for: "CHOL", "HDL", "LDLCALC", "LDLDIRECT", "TRIG", "CHOLHDL"  Significant Diagnostic Results in last 30 days:  No results found.  Assessment/Plan  1. Bruxism Robaxin 250 mg bid, change seroquel to 25 mg qhs to avoid lethargy   2. Essential hypertension Controlled On lasix.   3. Moderate persistent asthma without complication No new issues.  Continue formoterol and pulmicort and dupixent per pulmonary   4. Dementia of the Alzheimer's type, with late onset, with delusions (HCC) Continues with periods of lethargy and agitation.   Progressive decline in cognition and physical function c/w the disease. Continue supportive care in the skilled environment. Would continue namenda for  behaviors.   5. Gastro-esophageal reflux disease without esophagitis Continue prilosec  6. Slow transit constipation Continue miralax  7. Stage 3b chronic kidney disease (HCC) Continue to periodically monitor BMP and avoid nephrotoxic agents    Labs/tests ordered:  NA

## 2022-12-09 DIAGNOSIS — R293 Abnormal posture: Secondary | ICD-10-CM | POA: Diagnosis not present

## 2022-12-09 DIAGNOSIS — R1312 Dysphagia, oropharyngeal phase: Secondary | ICD-10-CM | POA: Diagnosis not present

## 2022-12-09 DIAGNOSIS — R41841 Cognitive communication deficit: Secondary | ICD-10-CM | POA: Diagnosis not present

## 2022-12-09 DIAGNOSIS — G301 Alzheimer's disease with late onset: Secondary | ICD-10-CM | POA: Diagnosis not present

## 2022-12-09 DIAGNOSIS — M24542 Contracture, left hand: Secondary | ICD-10-CM | POA: Diagnosis not present

## 2022-12-09 DIAGNOSIS — M24541 Contracture, right hand: Secondary | ICD-10-CM | POA: Diagnosis not present

## 2022-12-14 DIAGNOSIS — R293 Abnormal posture: Secondary | ICD-10-CM | POA: Diagnosis not present

## 2022-12-14 DIAGNOSIS — M24541 Contracture, right hand: Secondary | ICD-10-CM | POA: Diagnosis not present

## 2022-12-14 DIAGNOSIS — R41841 Cognitive communication deficit: Secondary | ICD-10-CM | POA: Diagnosis not present

## 2022-12-14 DIAGNOSIS — G301 Alzheimer's disease with late onset: Secondary | ICD-10-CM | POA: Diagnosis not present

## 2022-12-14 DIAGNOSIS — M24542 Contracture, left hand: Secondary | ICD-10-CM | POA: Diagnosis not present

## 2022-12-14 DIAGNOSIS — R1312 Dysphagia, oropharyngeal phase: Secondary | ICD-10-CM | POA: Diagnosis not present

## 2022-12-15 ENCOUNTER — Ambulatory Visit: Payer: Medicare Other | Admitting: Internal Medicine

## 2022-12-17 DIAGNOSIS — R1312 Dysphagia, oropharyngeal phase: Secondary | ICD-10-CM | POA: Diagnosis not present

## 2022-12-17 DIAGNOSIS — R293 Abnormal posture: Secondary | ICD-10-CM | POA: Diagnosis not present

## 2022-12-17 DIAGNOSIS — R41841 Cognitive communication deficit: Secondary | ICD-10-CM | POA: Diagnosis not present

## 2022-12-17 DIAGNOSIS — M24542 Contracture, left hand: Secondary | ICD-10-CM | POA: Diagnosis not present

## 2022-12-17 DIAGNOSIS — M24541 Contracture, right hand: Secondary | ICD-10-CM | POA: Diagnosis not present

## 2022-12-17 DIAGNOSIS — G301 Alzheimer's disease with late onset: Secondary | ICD-10-CM | POA: Diagnosis not present

## 2022-12-18 DIAGNOSIS — R1312 Dysphagia, oropharyngeal phase: Secondary | ICD-10-CM | POA: Diagnosis not present

## 2022-12-18 DIAGNOSIS — M24541 Contracture, right hand: Secondary | ICD-10-CM | POA: Diagnosis not present

## 2022-12-18 DIAGNOSIS — M24542 Contracture, left hand: Secondary | ICD-10-CM | POA: Diagnosis not present

## 2022-12-18 DIAGNOSIS — R293 Abnormal posture: Secondary | ICD-10-CM | POA: Diagnosis not present

## 2022-12-18 DIAGNOSIS — R41841 Cognitive communication deficit: Secondary | ICD-10-CM | POA: Diagnosis not present

## 2022-12-18 DIAGNOSIS — G301 Alzheimer's disease with late onset: Secondary | ICD-10-CM | POA: Diagnosis not present

## 2022-12-21 DIAGNOSIS — G301 Alzheimer's disease with late onset: Secondary | ICD-10-CM | POA: Diagnosis not present

## 2022-12-21 DIAGNOSIS — M24541 Contracture, right hand: Secondary | ICD-10-CM | POA: Diagnosis not present

## 2022-12-21 DIAGNOSIS — R293 Abnormal posture: Secondary | ICD-10-CM | POA: Diagnosis not present

## 2022-12-21 DIAGNOSIS — R1312 Dysphagia, oropharyngeal phase: Secondary | ICD-10-CM | POA: Diagnosis not present

## 2022-12-21 DIAGNOSIS — R41841 Cognitive communication deficit: Secondary | ICD-10-CM | POA: Diagnosis not present

## 2022-12-21 DIAGNOSIS — M24542 Contracture, left hand: Secondary | ICD-10-CM | POA: Diagnosis not present

## 2022-12-24 DIAGNOSIS — M24541 Contracture, right hand: Secondary | ICD-10-CM | POA: Diagnosis not present

## 2022-12-24 DIAGNOSIS — R41841 Cognitive communication deficit: Secondary | ICD-10-CM | POA: Diagnosis not present

## 2022-12-24 DIAGNOSIS — G301 Alzheimer's disease with late onset: Secondary | ICD-10-CM | POA: Diagnosis not present

## 2022-12-24 DIAGNOSIS — M24542 Contracture, left hand: Secondary | ICD-10-CM | POA: Diagnosis not present

## 2022-12-24 DIAGNOSIS — R1312 Dysphagia, oropharyngeal phase: Secondary | ICD-10-CM | POA: Diagnosis not present

## 2022-12-24 DIAGNOSIS — R293 Abnormal posture: Secondary | ICD-10-CM | POA: Diagnosis not present

## 2022-12-28 DIAGNOSIS — R41841 Cognitive communication deficit: Secondary | ICD-10-CM | POA: Diagnosis not present

## 2022-12-28 DIAGNOSIS — R293 Abnormal posture: Secondary | ICD-10-CM | POA: Diagnosis not present

## 2022-12-28 DIAGNOSIS — R1312 Dysphagia, oropharyngeal phase: Secondary | ICD-10-CM | POA: Diagnosis not present

## 2022-12-28 DIAGNOSIS — G301 Alzheimer's disease with late onset: Secondary | ICD-10-CM | POA: Diagnosis not present

## 2022-12-28 DIAGNOSIS — M24541 Contracture, right hand: Secondary | ICD-10-CM | POA: Diagnosis not present

## 2022-12-28 DIAGNOSIS — M24542 Contracture, left hand: Secondary | ICD-10-CM | POA: Diagnosis not present

## 2023-01-01 DIAGNOSIS — R41841 Cognitive communication deficit: Secondary | ICD-10-CM | POA: Diagnosis not present

## 2023-01-01 DIAGNOSIS — M24542 Contracture, left hand: Secondary | ICD-10-CM | POA: Diagnosis not present

## 2023-01-01 DIAGNOSIS — M24541 Contracture, right hand: Secondary | ICD-10-CM | POA: Diagnosis not present

## 2023-01-01 DIAGNOSIS — R293 Abnormal posture: Secondary | ICD-10-CM | POA: Diagnosis not present

## 2023-01-01 DIAGNOSIS — R1312 Dysphagia, oropharyngeal phase: Secondary | ICD-10-CM | POA: Diagnosis not present

## 2023-01-01 DIAGNOSIS — G301 Alzheimer's disease with late onset: Secondary | ICD-10-CM | POA: Diagnosis not present

## 2023-01-07 ENCOUNTER — Ambulatory Visit: Payer: Medicare Other | Admitting: Internal Medicine

## 2023-01-12 ENCOUNTER — Ambulatory Visit (INDEPENDENT_AMBULATORY_CARE_PROVIDER_SITE_OTHER): Payer: Medicare Other | Admitting: Pulmonary Disease

## 2023-01-12 ENCOUNTER — Encounter: Payer: Self-pay | Admitting: Pulmonary Disease

## 2023-01-12 VITALS — BP 102/58 | HR 67 | Temp 98.2°F | Ht 62.0 in | Wt 162.0 lb

## 2023-01-12 DIAGNOSIS — J4550 Severe persistent asthma, uncomplicated: Secondary | ICD-10-CM | POA: Diagnosis not present

## 2023-01-12 NOTE — Patient Instructions (Addendum)
Nice to see you again   No changes to medications  Return to clinic in 1 year or sooner as needed

## 2023-01-12 NOTE — Progress Notes (Signed)
@Patient  ID: [name], female    DOB: Jun 10, 1924, 89 y.o.   MRN: [id number]  Chief Complaint  Patient presents with   Follow-up    Breathing is overall doing well.     Referring provider: Mahlon Gammon, MD  HPI:   89 y.o. woman with asthma here for follow up of the same.  Most recent PCP note reviewed.  Symptoms well controlled. On nebulized ICS/LABA therapy as well as Dupixent.  Baseline dyspnea but largely unchanged.  Breathing seems stable.  In wheelchair today which is her norm.  No exacerbations or hospitalizations related to breathing in the interim.  Overall well-controlled.  PMH: asthma Surgical History: Hip surgery, nasal sinus surgery, hysterectomy Family History: father with colon cancer, no significant respiratory disease in first degree relative  Questionaires / Pulmonary Flowsheets:   ACT:  Asthma Control Test ACT Total Score  03/11/2021 11:35 AM 19    MMRC:     No data to display           Epworth:      No data to display           Tests:   FENO:  No results found for: "NITRICOXIDE"  PFT:     No data to display           WALK:      No data to display           Imaging: Personally reviewed  and as per EMR and discussion in this note  Lab Results: Personally reviewed, stable anemia noted CBC    Component Value Date/Time   WBC 5.5 08/20/2022 0000   WBC 7.2 02/02/2018 1031   RBC 3.63 (A) 08/20/2022 0000   HGB 11.1 (A) 08/20/2022 0000   HCT 33 (A) 08/20/2022 0000   PLT 186 08/20/2022 0000   MCV 89.8 02/02/2018 1031   MCH 30.3 07/24/2017 1822   MCHC 34.4 02/02/2018 1031   RDW 15.3 02/02/2018 1031   LYMPHSABS 0.9 02/02/2018 1031   MONOABS 0.5 02/02/2018 1031   EOSABS 0.7 02/02/2018 1031   BASOSABS 0.0 02/02/2018 1031    BMET    Component Value Date/Time   NA 137 08/20/2022 0000   K 4.3 08/20/2022 0000   CL 105 08/20/2022 0000   CO2 21 08/20/2022 0000   GLUCOSE 90 02/02/2018 1031   BUN 28 (A)  08/20/2022 0000   CREATININE 0.9 08/20/2022 0000   CREATININE 1.14 02/02/2018 1031   CALCIUM 9.5 08/20/2022 0000   GFRNONAA 59 (L) 07/24/2017 1822   GFRAA >60 07/24/2017 1822    BNP No results found for: "BNP"  ProBNP    Component Value Date/Time   PROBNP 37.0 02/02/2018 1031    Specialty Problems       Pulmonary Problems   Allergic rhinitis    Qualifier: Diagnosis of  By: Delford Field MD, Charlcie Cradle       Moderate persistent asthma           Vocal cord dysfunction   Severe persistent asthma without complication    Allergies  Allergen Reactions   Azithromycin Other (See Comments)    Nausea, weakness    Aspirin Other (See Comments)    Unknown reaction   Erythromycin    Molds & Smuts    Other     Seasonal Allergies.   Septra [Sulfamethoxazole-Trimethoprim]    Sulfa Antibiotics    Sulfonamide Derivatives Other (See Comments)    Unknown reaction    Immunization History  Administered Date(s) Administered   Covid-19, Mrna,Vaccine(Spikevax)59yrs and older 06/08/2022   Influenza Inj Mdck Quad Pf 05/18/2019, 05/07/2021   Influenza Split 04/04/2012, 05/03/2013, 05/10/2015   Influenza Whole 04/18/2008, 06/04/2009, 04/03/2010, 03/30/2011   Influenza, High Dose Seasonal PF 04/20/2018, 05/24/2020   Influenza,inj,Quad PF,6+ Mos 03/26/2017   Influenza-Unspecified 03/26/2010, 04/13/2012, 05/08/2013, 05/14/2014, 06/03/2014, 05/13/2015, 04/27/2016, 05/08/2022   Moderna Covid-19 Vaccine Bivalent Booster 36yrs & up 11/28/2021   Moderna SARS-COV2 Booster Vaccination 06/13/2020, 03/25/2021, 05/14/2021   Moderna Sars-Covid-2 Vaccination 09/05/2019, 10/03/2019   Pneumococcal Conjugate-13 03/05/2014   Pneumococcal Polysaccharide-23 06/04/2009   Pneumococcal-Unspecified 03/13/2009   Respiratory Syncytial Virus Vaccine,Recomb Aduvanted(Arexvy) 07/23/2022   Td 03/13/2009, 04/13/2012   Tdap 12/01/2022   Zoster Recombinat (Shingrix) 08/03/2017, 10/01/2017   Zoster, Live 03/21/2010     Past Medical History:  Diagnosis Date   Age-related osteoporosis without current pathological fracture    Alzheimer's disease (HCC)    with late onset   Arthritis    Asthma    Deficiency of other specified B group vitamins    Depression    Environmental allergies    mold   Fall    GERD without esophagitis    Hyperlipidemia    Hypertension    Hypertensive kidney disease with CKD (chronic kidney disease)    with stage I through stage 4 CKD    Memory loss    Seasonal allergies    Unspecified osteoarthritis, unspecified site     Tobacco History: Social History   Tobacco Use  Smoking Status Never  Smokeless Tobacco Never   Counseling given: Not Answered   Continue to not smoke  Outpatient Encounter Medications as of 01/12/2023  Medication Sig   acetaminophen (TYLENOL) 500 MG tablet Take 500 mg by mouth 3 (three) times daily.   Benzocaine-Menthol-Zinc Cl (ORAJEL 3X TOOTHACHE & GUM) 20-0.26-0.15 % GEL Use as directed 1 Application in the mouth or throat 3 (three) times daily as needed.   benzonatate (TESSALON) 200 MG capsule Take 1 capsule (200 mg total) by mouth 3 (three) times daily as needed for cough.   budesonide (PULMICORT) 0.5 MG/2ML nebulizer solution Take 0.5 mg by nebulization 2 (two) times daily.   Calcium Carb-Cholecalciferol (CALCIUM 600-D PO) Take 1 tablet by mouth daily.   Cholecalciferol (VITAMIN D) 2000 units tablet Take 2,000 Units by mouth at bedtime.   dextromethorphan (DELSYM) 30 MG/5ML liquid Take 60 mg by mouth every 12 (twelve) hours as needed for cough.   dupilumab (DUPIXENT) 300 MG/2ML prefilled syringe Inject 300 mg into the skin every 14 (fourteen) days.   FLUoxetine (PROZAC) 20 MG capsule Take 40 mg by mouth every morning.   fluticasone (FLONASE) 50 MCG/ACT nasal spray Place 2 sprays into both nostrils daily. 8 am - 11 am   formoterol (PERFOROMIST) 20 MCG/2ML nebulizer solution Take 20 mcg by nebulization 2 (two) times daily.   furosemide  (LASIX) 20 MG tablet Take 20 mg by mouth daily. Hold for SBP<120   hydrOXYzine (ATARAX) 25 MG tablet Take 1 tablet (25 mg total) by mouth 2 (two) times daily as needed for anxiety.   ipratropium-albuterol (DUONEB) 0.5-2.5 (3) MG/3ML SOLN Take 3 mLs by nebulization every 6 (six) hours as needed.   memantine (NAMENDA) 10 MG tablet Take 1 tablet (10 mg total) by mouth 2 (two) times daily.   methocarbamol (ROBAXIN) 500 MG tablet Take 0.5 tablets (250 mg total) by mouth in the morning and at bedtime.   montelukast (SINGULAIR) 10 MG tablet Take 1 tablet (10 mg total)  by mouth daily.   nystatin (MYCOSTATIN/NYSTOP) powder Apply 1 Application topically every morning.   omeprazole (PRILOSEC) 20 MG capsule Take 20 mg by mouth daily. 30 minutes before breakfast.   omeprazole (PRILOSEC) 20 MG capsule Take 20 mg by mouth every evening. 30 minutes before dinner.   Polyethyl Glycol-Propyl Glycol (SYSTANE) 0.4-0.3 % GEL ophthalmic gel Place 1 application into both eyes at bedtime.   polyethylene glycol (MIRALAX / GLYCOLAX) packet Take 17 g by mouth every 3 (three) days.   QUEtiapine (SEROQUEL) 25 MG tablet Take 0.5 tablets (12.5 mg total) by mouth at bedtime.   vitamin B-12 (CYANOCOBALAMIN) 1000 MCG tablet Take 3,000 mcg by mouth daily.   [DISCONTINUED] rivaroxaban (XARELTO) 10 MG TABS tablet Take 1 tablet (10 mg total) by mouth daily.   No facility-administered encounter medications on file as of 01/12/2023.     Review of Systems  Review of Systems  N/a Physical Exam  BP (!) 102/58 (BP Location: Left Arm, Cuff Size: Normal)   Pulse 67   Temp 98.2 F (36.8 C) (Oral)   Ht 5\' 2"  (1.575 m)   Wt 162 lb (73.5 kg)   SpO2 94% Comment: on RA  BMI 29.63 kg/m   Wt Readings from Last 5 Encounters:  01/12/23 162 lb (73.5 kg)  12/07/22 161 lb 3.2 oz (73.1 kg)  11/27/22 159 lb 12.8 oz (72.5 kg)  11/13/22 159 lb 12.8 oz (72.5 kg)  11/10/22 159 lb 12.8 oz (72.5 kg)    BMI Readings from Last 5 Encounters:   01/12/23 29.63 kg/m  12/07/22 29.48 kg/m  11/27/22 29.23 kg/m  11/13/22 29.23 kg/m  11/10/22 29.23 kg/m     Physical Exam General: Sitting in wheelchair, no acute distress Eyes: EOMI, no icterus Neck: Supple, no JVD appreciated Cardiovascular: Regular rate and rhythm, no murmur Pulmonary: Normal work of breathing, mild MS freeways left upper lung field MSK: No synovitis, no joint effusion Neuro: In wheelchair, no weakness, sensation intact Psych: Normal mood, full affect   Assessment & Plan:    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   To continue Dupixent, nebulized ICS/LABA as well as DuoNebs as needed.  Nasal allergies: s/p surgery in past. Continue montelukast and flonase.   Return in about 1 year (around 01/12/2024).   Karren Burly, MD 01/12/2023

## 2023-01-26 ENCOUNTER — Non-Acute Institutional Stay (SKILLED_NURSING_FACILITY): Payer: Medicare Other | Admitting: Orthopedic Surgery

## 2023-01-26 ENCOUNTER — Encounter: Payer: Self-pay | Admitting: Orthopedic Surgery

## 2023-01-26 DIAGNOSIS — S51011A Laceration without foreign body of right elbow, initial encounter: Secondary | ICD-10-CM | POA: Diagnosis not present

## 2023-01-26 DIAGNOSIS — K219 Gastro-esophageal reflux disease without esophagitis: Secondary | ICD-10-CM

## 2023-01-26 DIAGNOSIS — N1832 Chronic kidney disease, stage 3b: Secondary | ICD-10-CM | POA: Diagnosis not present

## 2023-01-26 DIAGNOSIS — S51012A Laceration without foreign body of left elbow, initial encounter: Secondary | ICD-10-CM | POA: Diagnosis not present

## 2023-01-26 DIAGNOSIS — J8283 Eosinophilic asthma: Secondary | ICD-10-CM

## 2023-01-26 DIAGNOSIS — I10 Essential (primary) hypertension: Secondary | ICD-10-CM | POA: Diagnosis not present

## 2023-01-26 DIAGNOSIS — F02818 Dementia in other diseases classified elsewhere, unspecified severity, with other behavioral disturbance: Secondary | ICD-10-CM

## 2023-01-26 DIAGNOSIS — F458 Other somatoform disorders: Secondary | ICD-10-CM

## 2023-01-26 DIAGNOSIS — F330 Major depressive disorder, recurrent, mild: Secondary | ICD-10-CM

## 2023-01-26 DIAGNOSIS — G301 Alzheimer's disease with late onset: Secondary | ICD-10-CM

## 2023-01-26 NOTE — Progress Notes (Signed)
[facility] Room Number: 121-A Place of Service:  SNF [phone]) Provider:  Hazle Nordmann, NP  PCP: Mahlon Gammon, MD  Patient Care Team: Mahlon Gammon, MD as PCP - General (Internal Medicine) Storm Frisk, MD (Pulmonary Disease)  Extended Emergency Contact Information Primary Emergency Contact: Mel Almond of [place] Home Phone: [phone] Mobile Phone: [phone] Relation: Daughter  Code Status:  DNR Goals of care: Advanced Directive information    01/26/2023    9:49 AM  Advanced Directives  Does Patient Have a Medical Advance Directive? Yes  Type of Estate agent of [place];Living will;Out of facility DNR (pink MOST or yellow form)  Does patient want to make changes to medical advance directive? No - Patient declined  Copy of Healthcare Power of Attorney in Chart? Yes - validated most recent copy scanned in chart (See row information)     Chief Complaint  Patient presents with   Medical Management of Chronic Issues    Routine Visit.     HPI:  Pt is a 89 y.o. female seen today for medical management of chronic diseases.    She currently resides on the skilled nursing unit at [facility]. PMH: HTN, allergic rhinitis, asthma, GERD, Alzheimer's, OA, osteoporosis, CKD 3b, and depression.  Skin tear L&R elbows- DOI 06/17, noticed during shower day, polymem dressing prn HTN- BUN/creat 28/0.9 08/20/2022, remains in lasix daily  CKD- see above Alzheimer's with delusions- CT head 2018 chronic ischemic microvascular disease, still having behaviors> yells for Jake Shark, non ambulatory, dependent with ADLs, remains on Namenda and vistaril prn Eosinophilic Asthma- followed by Dr. Judeth Horn seen 06/11> f/u in 1 year, remains on Dupixent, nebulized ICS/LABA and duonebs, Singulair and Flonase Bruxism- some bleeding to left lower lip, Seroquel increased to 25 mg 05/06 GERD- hgb 11.1  08/20/2022, remains on Prilosec Depression/Anxiety- no recent mood changes, Na+ 137 08/20/2022, remains on Prozac   06/20 evaluated by dentist, left lower tooth removed, tolerated well.   Recent blood pressures:  06/21- 149/79  06/11- 154/74  06/07- 117/70  Recent weights:  06/01- 160.2 lbs  05/01- 161.2 lbs  04/01- 159.8 lbs      Past Medical History:  Diagnosis Date   Age-related osteoporosis without current pathological fracture    Alzheimer's disease (HCC)    with late onset   Arthritis    Asthma    Deficiency of other specified B group vitamins    Depression    Environmental allergies    mold   Fall    GERD without esophagitis    Hyperlipidemia    Hypertension    Hypertensive kidney disease with CKD (chronic kidney disease)    with stage I through stage 4 CKD    Memory loss    Seasonal allergies    Unspecified osteoarthritis, unspecified site    Past Surgical History:  Procedure Laterality Date   HIP ARTHROPLASTY  06/25/2011   Procedure: ARTHROPLASTY BIPOLAR HIP;  Surgeon: Illene Labrador [place];  Location: [facility];  Service: Orthopedics;  Laterality: Right;   NASAL SINUS SURGERY     VAGINAL HYSTERECTOMY      Allergies  Allergen Reactions   Azithromycin Other (See Comments)    Nausea, weakness    Aspirin Other (See Comments)    Unknown reaction   Erythromycin    Molds & Smuts    Other     Seasonal Allergies.   Septra [Sulfamethoxazole-Trimethoprim]    Sulfa Antibiotics  Sulfonamide Derivatives Other (See Comments)    Unknown reaction    Allergies as of 01/26/2023       Reactions   Azithromycin Other (See Comments)   Nausea, weakness    Aspirin Other (See Comments)   Unknown reaction   Erythromycin    Molds & Smuts    Other    Seasonal Allergies.   Septra [sulfamethoxazole-trimethoprim]    Sulfa Antibiotics    Sulfonamide Derivatives Other (See Comments)   Unknown reaction        Medication List        Accurate as of January 26, 2023   9:49 AM. If you have any questions, ask your nurse or doctor.          STOP taking these medications    Orajel 3X Toothache & Gum 20-0.26-0.15 % Gel Generic drug: Benzocaine-Menthol-Zinc Cl Stopped by: Octavia Heir, NP       TAKE these medications    acetaminophen 500 MG tablet Commonly known as: TYLENOL Take 500 mg by mouth 3 (three) times daily.   benzonatate 200 MG capsule Commonly known as: TESSALON Take 1 capsule (200 mg total) by mouth 3 (three) times daily as needed for cough.   budesonide 0.5 MG/2ML nebulizer solution Commonly known as: PULMICORT Take 0.5 mg by nebulization 2 (two) times daily.   CALCIUM 600-D PO Take 1 tablet by mouth daily.   cyanocobalamin 1000 MCG tablet Commonly known as: VITAMIN B12 Take 3,000 mcg by mouth daily.   dextromethorphan 30 MG/5ML liquid Commonly known as: DELSYM Take 60 mg by mouth every 12 (twelve) hours as needed for cough.   Dupixent 300 MG/2ML prefilled syringe Generic drug: dupilumab Inject 300 mg into the skin every 14 (fourteen) days.   FLUoxetine 20 MG capsule Commonly known as: PROZAC Take 40 mg by mouth every morning.   fluticasone 50 MCG/ACT nasal spray Commonly known as: FLONASE Place 2 sprays into both nostrils daily. 8 am - 11 am   furosemide 20 MG tablet Commonly known as: LASIX Take 20 mg by mouth daily. Hold for SBP<120   hydrOXYzine 25 MG tablet Commonly known as: ATARAX Take 1 tablet (25 mg total) by mouth 2 (two) times daily as needed for anxiety.   ipratropium-albuterol 0.5-2.5 (3) MG/3ML Soln Commonly known as: DUONEB Take 3 mLs by nebulization every 6 (six) hours as needed.   memantine 10 MG tablet Commonly known as: Namenda Take 1 tablet (10 mg total) by mouth 2 (two) times daily.   methocarbamol 500 MG tablet Commonly known as: ROBAXIN Take 0.5 tablets (250 mg total) by mouth in the morning and at bedtime.   montelukast 10 MG tablet Commonly known as: SINGULAIR Take 1 tablet  (10 mg total) by mouth daily.   nystatin powder Commonly known as: MYCOSTATIN/NYSTOP Apply 1 Application topically every morning.   omeprazole 20 MG capsule Commonly known as: PRILOSEC Take 20 mg by mouth daily. 30 minutes before breakfast.   omeprazole 20 MG capsule Commonly known as: PRILOSEC Take 20 mg by mouth every evening. 30 minutes before dinner.   Perforomist 20 MCG/2ML nebulizer solution Generic drug: formoterol Take 20 mcg by nebulization 2 (two) times daily.   polyethylene glycol 17 g packet Commonly known as: MIRALAX / GLYCOLAX Take 17 g by mouth every 3 (three) days.   QUEtiapine 25 MG tablet Commonly known as: SEROquel Take 0.5 tablets (12.5 mg total) by mouth at bedtime.   Systane 0.4-0.3 % Gel ophthalmic gel Generic drug: Polyethyl  Glycol-Propyl Glycol Place 1 application into both eyes at bedtime.   Vitamin D 50 MCG (2000 UT) tablet Take 2,000 Units by mouth at bedtime.        Review of Systems  Unable to perform ROS: Dementia    Immunization History  Administered Date(s) Administered   Covid-19, Mrna,Vaccine(Spikevax)63yrs and older 06/08/2022   Influenza Inj Mdck Quad Pf 05/18/2019, 05/07/2021   Influenza Split 04/04/2012, 05/03/2013, 05/10/2015   Influenza Whole 04/18/2008, 06/04/2009, 04/03/2010, 03/30/2011   Influenza, High Dose Seasonal PF 04/20/2018, 05/24/2020   Influenza,inj,Quad PF,6+ Mos 03/26/2017   Influenza-Unspecified 03/26/2010, 04/13/2012, 05/08/2013, 05/14/2014, 06/03/2014, 05/13/2015, 04/27/2016, 05/08/2022   Moderna Covid-19 Vaccine Bivalent Booster 65yrs & up 11/28/2021   Moderna SARS-COV2 Booster Vaccination 06/13/2020, 03/25/2021, 05/14/2021   Moderna Sars-Covid-2 Vaccination 09/05/2019, 10/03/2019   Pneumococcal Conjugate-13 03/05/2014   Pneumococcal Polysaccharide-23 06/04/2009   Pneumococcal-Unspecified 03/13/2009   Respiratory Syncytial Virus Vaccine,Recomb Aduvanted(Arexvy) 07/23/2022   Td 03/13/2009, 04/13/2012    Tdap 12/01/2022   Zoster Recombinat (Shingrix) 08/03/2017, 10/01/2017   Zoster, Live 03/21/2010   Pertinent  Health Maintenance Due  Topic Date Due   INFLUENZA VACCINE  03/04/2023   DEXA SCAN  Discontinued      12/07/2012   10:00 AM 01/11/2020    9:49 AM 09/26/2021    1:02 PM 08/19/2022    2:35 PM 11/27/2022   10:29 AM  Fall Risk  Falls in the past year? Yes 0 0 0 0  Was there an injury with Fall?  0 0 0 0  Fall Risk Category Calculator  0 0 0 0  Fall Risk Category (Retired)  Low Low    (RETIRED) Patient Fall Risk Level  Low fall risk Low fall risk    Patient at Risk for Falls Due to Impaired balance/gait  Impaired balance/gait No Fall Risks History of fall(s)  Fall risk Follow up   Falls evaluation completed Falls evaluation completed Falls evaluation completed   Functional Status Survey:    Vitals:   01/26/23 0943  BP: (!) 149/79  Pulse: 70  Resp: 17  Temp: 98.5 F (36.9 C)  SpO2: 98%  Weight: 160 lb 8 oz (72.8 kg)  Height: 5\' 2"  (1.575 m)   Body mass index is 29.36 kg/m. Physical Exam Vitals reviewed.  Constitutional:      General: She is not in acute distress. HENT:     Head: Normocephalic.     Right Ear: There is no impacted cerumen.     Left Ear: There is no impacted cerumen.     Nose: Nose normal.     Mouth/Throat:     Mouth: Mucous membranes are dry.     Pharynx: No posterior oropharyngeal erythema.     Comments: Dried blood to left lower lip Eyes:     General:        Right eye: No discharge.        Left eye: No discharge.  Cardiovascular:     Rate and Rhythm: Normal rate and regular rhythm.     Pulses: Normal pulses.     Heart sounds: Normal heart sounds.  Pulmonary:     Effort: Pulmonary effort is normal. No respiratory distress.     Breath sounds: Normal breath sounds. No wheezing or rales.  Abdominal:     General: Bowel sounds are normal. There is no distension.     Palpations: Abdomen is soft.     Tenderness: There is no abdominal tenderness.   Musculoskeletal:     Cervical back:  Neck supple.     Right lower leg: No edema.     Left lower leg: No edema.  Skin:    General: Skin is warm and dry.     Capillary Refill: Capillary refill takes less than 2 seconds.     Findings: Lesion present.     Comments: Left and right elbow with Approx< 1 cm skin tear, CDI, covered with polymem  Neurological:     General: No focal deficit present.     Mental Status: She is alert. Mental status is at baseline.     Motor: Weakness present.     Gait: Gait abnormal.     Comments: Hoyer transfer  Psychiatric:        Mood and Affect: Mood normal.     Comments: Alert to self, does not follow commands     Labs reviewed: Recent Labs    08/20/22 0000  NA 137  K 4.3  CL 105  CO2 21  BUN 28*  CREATININE 0.9  CALCIUM 9.5   Recent Labs    08/20/22 0000  AST 17  ALT 12  ALKPHOS 100  ALBUMIN 3.3*   Recent Labs    08/20/22 0000  WBC 5.5  HGB 11.1*  HCT 33*  PLT 186   Lab Results  Component Value Date   TSH 1.52 09/23/2020   No results found for: "HGBA1C" No results found for: "CHOL", "HDL", "LDLCALC", "LDLDIRECT", "TRIG", "CHOLHDL"  Significant Diagnostic Results in last 30 days:  No results found.  Assessment/Plan 1. Skin tear of left elbow without complication, initial encounter - DOI 06/17 - no sign of infection - cont PolyMem dressing changes   2. Skin tear of right elbow without complication, initial encounter - see above  3. Essential hypertension - controlled with furosemide  4. Stage 3b chronic kidney disease (HCC) - encourage hydration with water - avoid NSAIDS  5. Dementia of the Alzheimer's type, with late onset, with delusions (HCC) - intermittent agitation - hoyer lift - dependent with ADLs - weights stable - cont Namenda and hydroxyzine   6. Eosinophilic asthma - followed by Dr. Judeth Horn seen 06/11> no changes to meds> f/u in 1 year - cont Dupixent, nebulized ICS/LABA and duonebs, Singulair  and Flonase  7. Bruxism - ongoing - dried blood to left lower lip> just had tooth removed - 05/06 Seroquel increased to 25 mg daily  8. Gastro-esophageal reflux disease without esophagitis - hgb stable - cont Prilosec  9. Mild episode of recurrent major depressive disorder (HCC) - no mood changes - cont Prozac    Family/ staff Communication: plan discussed with patient and nurse  Labs/tests ordered: none

## 2023-02-16 ENCOUNTER — Non-Acute Institutional Stay (SKILLED_NURSING_FACILITY): Payer: Medicare Other | Admitting: Orthopedic Surgery

## 2023-02-16 ENCOUNTER — Encounter: Payer: Self-pay | Admitting: Orthopedic Surgery

## 2023-02-16 DIAGNOSIS — F330 Major depressive disorder, recurrent, mild: Secondary | ICD-10-CM

## 2023-02-16 DIAGNOSIS — F458 Other somatoform disorders: Secondary | ICD-10-CM

## 2023-02-16 NOTE — Progress Notes (Signed)
[facility] Room Number: 121-A Place of Service:  SNF [phone]) Provider:  Hazle Nordmann, NP  PCP: Mahlon Gammon, MD  Patient Care Team: Mahlon Gammon, MD as PCP - General (Internal Medicine) Storm Frisk, MD (Pulmonary Disease)  Extended Emergency Contact Information Primary Emergency Contact: Mel Almond of [place] Home Phone: [phone] Mobile Phone: [phone] Relation: Daughter  Code Status:  DNR Goals of care: Advanced Directive information    02/16/2023   10:38 AM  Advanced Directives  Does Patient Have a Medical Advance Directive? Yes  Type of Estate agent of [place];Living will;Out of facility DNR (pink MOST or yellow form)  Does patient want to make changes to medical advance directive? No - Patient declined  Copy of Healthcare Power of Attorney in Chart? Yes - validated most recent copy scanned in chart (See row information)     Chief Complaint  Patient presents with   Acute Visit    Teeth grinding.     HPI:  Pt is a 89 y.o. female seen today for an acute visit due to ongoing bruxism.   She currently resides on the skilled nursing unit at [facility]. PMH: HTN, allergic rhinitis, asthma, GERD, Alzheimer's, OA, osteoporosis, CKD 3b, and depression.   Ongoing teeth grinding x 4 months. She is unable to use mouth guard due to dementia. Vistaril prn unsuccessful. 04/09 Seroquel 12.5 mg daily started. Sundown behaviors did improve with Seroquel, but teeth grinding did not. 06/20 she was seen by dentist and left lower tooth removed. She is now observed grinding teeth at all times of the day. She continues to have intermittent skin breakdown to lips from clinching down. Afebrile. Vitals stable.    Past Medical History:  Diagnosis Date   Age-related osteoporosis without current pathological fracture    Alzheimer's disease (HCC)    with late onset   Arthritis    Asthma     Deficiency of other specified B group vitamins    Depression    Environmental allergies    mold   Fall    GERD without esophagitis    Hyperlipidemia    Hypertension    Hypertensive kidney disease with CKD (chronic kidney disease)    with stage I through stage 4 CKD    Memory loss    Seasonal allergies    Unspecified osteoarthritis, unspecified site    Past Surgical History:  Procedure Laterality Date   HIP ARTHROPLASTY  06/25/2011   Procedure: ARTHROPLASTY BIPOLAR HIP;  Surgeon: Illene Labrador [place];  Location: [facility];  Service: Orthopedics;  Laterality: Right;   NASAL SINUS SURGERY     VAGINAL HYSTERECTOMY      Allergies  Allergen Reactions   Azithromycin Other (See Comments)    Nausea, weakness    Aspirin Other (See Comments)    Unknown reaction   Erythromycin    Molds & Smuts    Other     Seasonal Allergies.   Septra [Sulfamethoxazole-Trimethoprim]    Sulfa Antibiotics    Sulfonamide Derivatives Other (See Comments)    Unknown reaction    Allergies as of 02/16/2023       Reactions   Azithromycin Other (See Comments)   Nausea, weakness    Aspirin Other (See Comments)   Unknown reaction   Erythromycin    Molds & Smuts    Other    Seasonal Allergies.   Septra [sulfamethoxazole-trimethoprim]    Sulfa Antibiotics    Sulfonamide  Derivatives Other (See Comments)   Unknown reaction        Medication List        Accurate as of February 16, 2023 10:39 AM. If you have any questions, ask your nurse or doctor.          STOP taking these medications    Dupixent 300 MG/2ML prefilled syringe Generic drug: dupilumab Stopped by: Ivana Nicastro E Sue Fernicola       TAKE these medications    acetaminophen 500 MG tablet Commonly known as: TYLENOL Take 500 mg by mouth 3 (three) times daily.   benzonatate 200 MG capsule Commonly known as: TESSALON Take 1 capsule (200 mg total) by mouth 3 (three) times daily as needed for cough.   budesonide 0.5 MG/2ML nebulizer  solution Commonly known as: PULMICORT Take 0.5 mg by nebulization 2 (two) times daily.   CALCIUM 600-D PO Take 1 tablet by mouth daily.   cyanocobalamin 1000 MCG tablet Commonly known as: VITAMIN B12 Take 3,000 mcg by mouth daily.   dextromethorphan 30 MG/5ML liquid Commonly known as: DELSYM Take 60 mg by mouth every 12 (twelve) hours as needed for cough.   FLUoxetine 20 MG capsule Commonly known as: PROZAC Take 40 mg by mouth every morning.   fluticasone 50 MCG/ACT nasal spray Commonly known as: FLONASE Place 2 sprays into both nostrils daily. 8 am - 11 am   furosemide 20 MG tablet Commonly known as: LASIX Take 20 mg by mouth daily. Hold for SBP<120   hydrOXYzine 25 MG tablet Commonly known as: ATARAX Take 1 tablet (25 mg total) by mouth 2 (two) times daily as needed for anxiety.   ipratropium-albuterol 0.5-2.5 (3) MG/3ML Soln Commonly known as: DUONEB Take 3 mLs by nebulization every 6 (six) hours as needed.   memantine 10 MG tablet Commonly known as: Namenda Take 1 tablet (10 mg total) by mouth 2 (two) times daily.   methocarbamol 500 MG tablet Commonly known as: ROBAXIN Take 0.5 tablets (250 mg total) by mouth in the morning and at bedtime.   montelukast 10 MG tablet Commonly known as: SINGULAIR Take 1 tablet (10 mg total) by mouth daily.   nystatin powder Commonly known as: MYCOSTATIN/NYSTOP Apply 1 Application topically every morning.   omeprazole 20 MG capsule Commonly known as: PRILOSEC Take 20 mg by mouth every morning. 30 minutes before breakfast.   omeprazole 20 MG capsule Commonly known as: PRILOSEC Take 20 mg by mouth every evening. 30 minutes before dinner.   Perforomist 20 MCG/2ML nebulizer solution Generic drug: formoterol Take 20 mcg by nebulization 2 (two) times daily.   polyethylene glycol 17 g packet Commonly known as: MIRALAX / GLYCOLAX Take 17 g by mouth every 3 (three) days.   QUEtiapine 25 MG tablet Commonly known as:  SEROQUEL Take 25 mg by mouth at bedtime. What changed: Another medication with the same name was removed. Continue taking this medication, and follow the directions you see here. Changed by: Luberta Robertson Daymien Goth   Systane 0.4-0.3 % Gel ophthalmic gel Generic drug: Polyethyl Glycol-Propyl Glycol Place 1 application into both eyes at bedtime.   Vitamin D 50 MCG (2000 UT) tablet Take 2,000 Units by mouth at bedtime.        Review of Systems  Immunization History  Administered Date(s) Administered   Covid-19, Mrna,Vaccine(Spikevax)13yrs and older 06/08/2022   Influenza Inj Mdck Quad Pf 05/18/2019, 05/07/2021   Influenza Split 04/04/2012, 05/03/2013, 05/10/2015   Influenza Whole 04/18/2008, 06/04/2009, 04/03/2010, 03/30/2011   Influenza, High  Dose Seasonal PF 04/20/2018, 05/24/2020   Influenza,inj,Quad PF,6+ Mos 03/26/2017   Influenza-Unspecified 03/26/2010, 04/13/2012, 05/08/2013, 05/14/2014, 06/03/2014, 05/13/2015, 04/27/2016, 05/08/2022   Moderna Covid-19 Vaccine Bivalent Booster 63yrs & up 11/28/2021   Moderna SARS-COV2 Booster Vaccination 06/13/2020, 03/25/2021, 05/14/2021   Moderna Sars-Covid-2 Vaccination 09/05/2019, 10/03/2019   Pneumococcal Conjugate-13 03/05/2014   Pneumococcal Polysaccharide-23 06/04/2009   Pneumococcal-Unspecified 03/13/2009   Respiratory Syncytial Virus Vaccine,Recomb Aduvanted(Arexvy) 07/23/2022   Td 03/13/2009, 04/13/2012   Tdap 12/01/2022   Zoster Recombinant(Shingrix) 08/03/2017, 10/01/2017   Zoster, Live 03/21/2010   Pertinent  Health Maintenance Due  Topic Date Due   INFLUENZA VACCINE  03/04/2023   DEXA SCAN  Discontinued      12/07/2012   10:00 AM 01/11/2020    9:49 AM 09/26/2021    1:02 PM 08/19/2022    2:35 PM 11/27/2022   10:29 AM  Fall Risk  Falls in the past year? Yes 0 0 0 0  Was there an injury with Fall?  0 0 0 0  Fall Risk Category Calculator  0 0 0 0  Fall Risk Category (Retired)  Low Low    (RETIRED) Patient Fall Risk Level  Low fall  risk Low fall risk    Patient at Risk for Falls Due to Impaired balance/gait  Impaired balance/gait No Fall Risks History of fall(s)  Fall risk Follow up   Falls evaluation completed Falls evaluation completed Falls evaluation completed   Functional Status Survey:    Vitals:   02/16/23 1032  BP: (!) 144/82  Pulse: 64  Resp: 17  Temp: (!) 97.2 F (36.2 C)  SpO2: 100%  Weight: 156 lb 3.2 oz (70.9 kg)  Height: 5\' 2"  (1.575 m)   Body mass index is 28.57 kg/m. Physical Exam Vitals reviewed.  Constitutional:      General: She is not in acute distress. HENT:     Head: Normocephalic.     Mouth/Throat:     Lips: No lesions.     Mouth: Mucous membranes are moist. No oral lesions.     Palate: No mass.     Pharynx: Uvula midline. No oropharyngeal exudate.  Eyes:     General:        Right eye: No discharge.        Left eye: No discharge.  Cardiovascular:     Rate and Rhythm: Normal rate and regular rhythm.     Pulses: Normal pulses.     Heart sounds: Normal heart sounds.  Pulmonary:     Effort: Pulmonary effort is normal. No respiratory distress.     Breath sounds: Normal breath sounds. No wheezing.  Musculoskeletal:     Cervical back: Neck supple.     Right lower leg: Edema present.     Left lower leg: Edema present.     Comments: Non pitting  Skin:    General: Skin is warm.     Capillary Refill: Capillary refill takes less than 2 seconds.  Neurological:     General: No focal deficit present.     Mental Status: She is easily aroused. Mental status is at baseline.     Motor: Weakness present.     Gait: Gait abnormal.  Psychiatric:        Mood and Affect: Mood normal.     Comments: Aphasia, follows some commands, alert to self and familiar face     Labs reviewed: Recent Labs    08/20/22 0000  NA 137  K 4.3  CL 105  CO2 21  BUN 28*  CREATININE 0.9  CALCIUM 9.5   Recent Labs    08/20/22 0000  AST 17  ALT 12  ALKPHOS 100  ALBUMIN 3.3*   Recent Labs     08/20/22 0000  WBC 5.5  HGB 11.1*  HCT 33*  PLT 186   Lab Results  Component Value Date   TSH 1.52 09/23/2020   No results found for: "HGBA1C" No results found for: "CHOL", "HDL", "LDLCALC", "LDLDIRECT", "TRIG", "CHOLHDL"  Significant Diagnostic Results in last 30 days:  No results found.  Assessment/Plan 1. Bruxism - ongoing x 4 months - unsuccessful trial Vistaril - 11/2022 Seroquel started- sundowning improved - 06/20 left lower tooth removed by dentist - exam unremarkable today - bruxism possible SE of SSRI> will cut Prozac to 20 mg  - unable to recommend mouth guard due to dementia - consider trial of low dose flexeril if no improvement in 2-3 weeks  2. Mild episode of recurrent major depressive disorder (HCC) - see above - no mood changes - Na+ 137 08/20/2022 - cont Prozac> now 20 mg daily    Family/ staff Communication: plan discussed with patient and nurse  Labs/tests ordered:  none

## 2023-03-01 ENCOUNTER — Non-Acute Institutional Stay (SKILLED_NURSING_FACILITY): Payer: Medicare Other | Admitting: Adult Health

## 2023-03-01 ENCOUNTER — Encounter: Payer: Self-pay | Admitting: Adult Health

## 2023-03-01 DIAGNOSIS — F325 Major depressive disorder, single episode, in full remission: Secondary | ICD-10-CM

## 2023-03-01 DIAGNOSIS — J4550 Severe persistent asthma, uncomplicated: Secondary | ICD-10-CM

## 2023-03-01 DIAGNOSIS — K219 Gastro-esophageal reflux disease without esophagitis: Secondary | ICD-10-CM | POA: Diagnosis not present

## 2023-03-01 DIAGNOSIS — I10 Essential (primary) hypertension: Secondary | ICD-10-CM

## 2023-03-01 DIAGNOSIS — N1832 Chronic kidney disease, stage 3b: Secondary | ICD-10-CM

## 2023-03-01 DIAGNOSIS — K5901 Slow transit constipation: Secondary | ICD-10-CM | POA: Diagnosis not present

## 2023-03-01 DIAGNOSIS — F458 Other somatoform disorders: Secondary | ICD-10-CM

## 2023-03-01 MED ORDER — DUPIXENT 300 MG/2ML ~~LOC~~ SOSY: 300.0000 mg | PREFILLED_SYRINGE | SUBCUTANEOUS | Status: AC

## 2023-03-01 MED ORDER — METHOCARBAMOL 500 MG PO TABS: 500.0000 mg | ORAL_TABLET | Freq: Two times a day (BID) | ORAL | Status: DC

## 2023-03-01 NOTE — Progress Notes (Signed)
Location:  [facility] of Service:  SNF (31) Provider:  Tamsen Roers, MD  Patient Care Team: Mahlon Gammon, MD as PCP - General (Internal Medicine) Storm Frisk, MD (Pulmonary Disease)  Extended Emergency Contact Information Primary Emergency Contact: Mel Almond of [place] Home Phone: [phone] Mobile Phone: [phone] Relation: Daughter  Code Status:  DNR Goals of care: Advanced Directive information    02/16/2023   10:38 AM  Advanced Directives  Does Patient Have a Medical Advance Directive? Yes  Type of Estate agent of [place];Living will;Out of facility DNR (pink MOST or yellow form)  Does patient want to make changes to medical advance directive? No - Patient declined  Copy of Healthcare Power of Attorney in Chart? Yes - validated most recent copy scanned in chart (See row information)     Chief Complaint  Patient presents with   Medical Management of Chronic Issues    HPI:  Pt is a 89 y.o. female seen today for medical management of chronic diseases.    PMH significant for asthma, OP, arthritis, Vit d deficiency, depression, dementia, HTN, HLD, CKD hyperlipidemia, seasonal allergies, OA, and GERD.   Nurse report she continues to have teeth grinding. Also can be anxious yelling for help. Behavior can occur day or night. No aggression towards staff. Does have some delusions as well.  She has vistaril for behaviors which helps some. Prozac was decreased to 20 mg 02/16/23 to see if this would help with teeth grinding. No change thus far. The nurse wonders if she is in pain because she yells when she is being turned.   She requires a hoyer lift for transfers, help with feeing, bathing, and dressing. Also she is incontinent  Asthma: no cough or wheeze reported. On dupixent, pulmicort, and perforomist. No prn neb needed.  Past Medical History:  Diagnosis Date    Age-related osteoporosis without current pathological fracture    Alzheimer's disease (HCC)    with late onset   Arthritis    Asthma    Deficiency of other specified B group vitamins    Depression    Environmental allergies    mold   Fall    GERD without esophagitis    Hyperlipidemia    Hypertension    Hypertensive kidney disease with CKD (chronic kidney disease)    with stage I through stage 4 CKD    Memory loss    Seasonal allergies    Unspecified osteoarthritis, unspecified site    Past Surgical History:  Procedure Laterality Date   HIP ARTHROPLASTY  06/25/2011   Procedure: ARTHROPLASTY BIPOLAR HIP;  Surgeon: Illene Labrador [place];  Location: [facility];  Service: Orthopedics;  Laterality: Right;   NASAL SINUS SURGERY     VAGINAL HYSTERECTOMY      Allergies  Allergen Reactions   Azithromycin Other (See Comments)    Nausea, weakness    Aspirin Other (See Comments)    Unknown reaction   Erythromycin    Molds & Smuts    Other     Seasonal Allergies.   Septra [Sulfamethoxazole-Trimethoprim]    Sulfa Antibiotics    Sulfonamide Derivatives Other (See Comments)    Unknown reaction    Outpatient Encounter Medications as of 03/01/2023  Medication Sig   acetaminophen (TYLENOL) 500 MG tablet Take 500 mg by mouth 3 (three) times daily.   benzonatate (TESSALON) 200 MG capsule Take 1 capsule (200 mg total) by mouth 3 (three)  times daily as needed for cough.   budesonide (PULMICORT) 0.5 MG/2ML nebulizer solution Take 0.5 mg by nebulization 2 (two) times daily.   Calcium Carb-Cholecalciferol (CALCIUM 600-D PO) Take 1 tablet by mouth daily.   Cholecalciferol (VITAMIN D) 2000 units tablet Take 2,000 Units by mouth at bedtime.   dextromethorphan (DELSYM) 30 MG/5ML liquid Take 60 mg by mouth every 12 (twelve) hours as needed for cough.   dupilumab (DUPIXENT) 300 MG/2ML prefilled syringe Inject 300 mg into the skin every 14 (fourteen) days.   FLUoxetine (PROZAC) 20 MG capsule Take 20 mg by  mouth every morning.   fluticasone (FLONASE) 50 MCG/ACT nasal spray Place 2 sprays into both nostrils daily. 8 am - 11 am   formoterol (PERFOROMIST) 20 MCG/2ML nebulizer solution Take 20 mcg by nebulization 2 (two) times daily.   furosemide (LASIX) 20 MG tablet Take 20 mg by mouth daily. Hold for SBP<120   hydrOXYzine (ATARAX) 25 MG tablet Take 1 tablet (25 mg total) by mouth 2 (two) times daily as needed for anxiety.   ipratropium-albuterol (DUONEB) 0.5-2.5 (3) MG/3ML SOLN Take 3 mLs by nebulization every 6 (six) hours as needed.   memantine (NAMENDA) 10 MG tablet Take 1 tablet (10 mg total) by mouth 2 (two) times daily.   montelukast (SINGULAIR) 10 MG tablet Take 1 tablet (10 mg total) by mouth daily.   nystatin (MYCOSTATIN/NYSTOP) powder Apply 1 Application topically every morning.   omeprazole (PRILOSEC) 20 MG capsule Take 20 mg by mouth every morning. 30 minutes before breakfast.   omeprazole (PRILOSEC) 20 MG capsule Take 20 mg by mouth every evening. 30 minutes before dinner.   polyethylene glycol (MIRALAX / GLYCOLAX) packet Take 17 g by mouth every 3 (three) days.   QUEtiapine (SEROQUEL) 25 MG tablet Take 25 mg by mouth at bedtime.   vitamin B-12 (CYANOCOBALAMIN) 1000 MCG tablet Take 3,000 mcg by mouth daily.   [DISCONTINUED] methocarbamol (ROBAXIN) 500 MG tablet Take 0.5 tablets (250 mg total) by mouth in the morning and at bedtime.   methocarbamol (ROBAXIN) 500 MG tablet Take 1 tablet (500 mg total) by mouth in the morning and at bedtime.   Polyethyl Glycol-Propyl Glycol (SYSTANE) 0.4-0.3 % GEL ophthalmic gel Place 1 application into both eyes at bedtime.   [DISCONTINUED] rivaroxaban (XARELTO) 10 MG TABS tablet Take 1 tablet (10 mg total) by mouth daily.   No facility-administered encounter medications on file as of 03/01/2023.    Review of Systems  Unable to perform ROS: Dementia    Immunization History  Administered Date(s) Administered   Covid-19, Mrna,Vaccine(Spikevax)37yrs  and older 06/08/2022   Influenza Inj Mdck Quad Pf 05/18/2019, 05/07/2021   Influenza Split 04/04/2012, 05/03/2013, 05/10/2015   Influenza Whole 04/18/2008, 06/04/2009, 04/03/2010, 03/30/2011   Influenza, High Dose Seasonal PF 04/20/2018, 05/24/2020   Influenza,inj,Quad PF,6+ Mos 03/26/2017   Influenza-Unspecified 03/26/2010, 04/13/2012, 05/08/2013, 05/14/2014, 06/03/2014, 05/13/2015, 04/27/2016, 05/08/2022   Moderna Covid-19 Vaccine Bivalent Booster 12yrs & up 11/28/2021   Moderna SARS-COV2 Booster Vaccination 06/13/2020, 03/25/2021, 05/14/2021   Moderna Sars-Covid-2 Vaccination 09/05/2019, 10/03/2019   Pneumococcal Conjugate-13 03/05/2014   Pneumococcal Polysaccharide-23 06/04/2009   Pneumococcal-Unspecified 03/13/2009   Respiratory Syncytial Virus Vaccine,Recomb Aduvanted(Arexvy) 07/23/2022   Td 03/13/2009, 04/13/2012   Tdap 12/01/2022   Zoster Recombinant(Shingrix) 08/03/2017, 10/01/2017   Zoster, Live 03/21/2010   Pertinent  Health Maintenance Due  Topic Date Due   INFLUENZA VACCINE  03/04/2023   DEXA SCAN  Discontinued      01/11/2020    9:49 AM 09/26/2021  1:02 PM 08/19/2022    2:35 PM 11/27/2022   10:29 AM 02/16/2023    2:48 PM  Fall Risk  Falls in the past year? 0 0 0 0 0  Was there an injury with Fall? 0 0 0 0 0  Fall Risk Category Calculator 0 0 0 0 0  Fall Risk Category (Retired) Low Low     (RETIRED) Patient Fall Risk Level Low fall risk Low fall risk     Patient at Risk for Falls Due to  Impaired balance/gait No Fall Risks History of fall(s) History of fall(s);Impaired balance/gait;Impaired mobility  Fall risk Follow up  Falls evaluation completed Falls evaluation completed Falls evaluation completed Falls evaluation completed;Education provided;Falls prevention discussed   Functional Status Survey:    Vitals:   03/01/23 1221  BP: 121/62  Weight: 156 lb 3.2 oz (70.9 kg)   Body mass index is 28.57 kg/m. Physical Exam Vitals and nursing note reviewed.   Constitutional:      General: She is not in acute distress.    Appearance: She is not diaphoretic.  HENT:     Head: Normocephalic and atraumatic.     Nose: Nose normal.     Mouth/Throat:     Mouth: Mucous membranes are moist.     Pharynx: Oropharynx is clear.     Comments: Bruise to left clavicle area Neck:     Vascular: No JVD.  Cardiovascular:     Rate and Rhythm: Normal rate and regular rhythm.     Heart sounds: No murmur heard. Pulmonary:     Effort: Pulmonary effort is normal. No respiratory distress.     Breath sounds: Normal breath sounds. No wheezing.  Abdominal:     General: Bowel sounds are normal. There is no distension.     Palpations: Abdomen is soft.     Tenderness: There is no abdominal tenderness.  Musculoskeletal:     Comments: Trace edema to both ankles.  No pain with ROM of either shoulder. Not able to f/c for further exam. No tenderness on palpation of either shoulder joint or tenderness   Skin:    General: Skin is warm and dry.  Neurological:     General: No focal deficit present.     Mental Status: She is alert. Mental status is at baseline.     Labs reviewed: Recent Labs    08/20/22 0000  NA 137  K 4.3  CL 105  CO2 21  BUN 28*  CREATININE 0.9  CALCIUM 9.5   Recent Labs    08/20/22 0000  AST 17  ALT 12  ALKPHOS 100  ALBUMIN 3.3*   Recent Labs    08/20/22 0000  WBC 5.5  HGB 11.1*  HCT 33*  PLT 186   Lab Results  Component Value Date   TSH 1.52 09/23/2020   No results found for: "HGBA1C" No results found for: "CHOL", "HDL", "LDLCALC", "LDLDIRECT", "TRIG", "CHOLHDL"  Significant Diagnostic Results in last 30 days:  No results found.  Assessment/Plan  1. Severe persistent asthma without complication No current issues Followed by pulmonary Should continue dupixent, perforomist, pulmicort,   2. Essential hypertension Goal <150/90 for her age  58. Gastro-esophageal reflux disease without esophagitis Continue prilosec,  also helps with cough related to asthma   4. Slow transit constipation Continue miralax q 3 days   5. Stage 3b chronic kidney disease (HCC) Continue to periodically monitor BMP and avoid nephrotoxic agents  6. Major depressive disorder, single episode, in remission (HCC)  Some increase in yelling with dose reduction of prozac, will give her more time to adjust.   7. Bruxism Nurse also reports concern for pain. Sleepy this morning. Will increase robaxin 500 mg bid for now.     Labs/tests ordered:  CBC BMP

## 2023-03-15 ENCOUNTER — Non-Acute Institutional Stay (SKILLED_NURSING_FACILITY): Payer: Medicare Other | Admitting: Adult Health

## 2023-03-15 DIAGNOSIS — Z7189 Other specified counseling: Secondary | ICD-10-CM | POA: Diagnosis not present

## 2023-03-15 DIAGNOSIS — U071 COVID-19: Secondary | ICD-10-CM

## 2023-03-15 DIAGNOSIS — J4550 Severe persistent asthma, uncomplicated: Secondary | ICD-10-CM

## 2023-03-15 LAB — BASIC METABOLIC PANEL
BUN: 21 (ref 4–21)
CO2: 22 (ref 13–22)
Chloride: 103 (ref 99–108)
Creatinine: 0.9 (ref 0.5–1.1)
Glucose: 83
Potassium: 4.5 mEq/L (ref 3.5–5.1)
Sodium: 139 (ref 137–147)

## 2023-03-15 LAB — COMPREHENSIVE METABOLIC PANEL
Albumin: 3.6 (ref 3.5–5.0)
Calcium: 9.3 (ref 8.7–10.7)
eGFR: 55

## 2023-03-15 LAB — HEPATIC FUNCTION PANEL
ALT: 12 U/L (ref 7–35)
AST: 17 (ref 13–35)
Alkaline Phosphatase: 113 (ref 25–125)
Bilirubin, Direct: 0.2
Bilirubin, Total: 5.6

## 2023-03-15 MED ORDER — DOXYCYCLINE HYCLATE 100 MG PO TABS: 100.0000 mg | ORAL_TABLET | Freq: Two times a day (BID) | ORAL | Status: AC

## 2023-03-15 NOTE — Progress Notes (Unsigned)
Location:  Medical illustrator of Service:  SNF (31) Provider:   Peggye Ley, ANP [facility] [phone]   Mahlon Gammon, MD  Patient Care Team: Mahlon Gammon, MD as PCP - General (Internal Medicine) Storm Frisk, MD (Pulmonary Disease)  Extended Emergency Contact Information Primary Emergency Contact: Mel Almond of [place] Home Phone: [phone] Mobile Phone: [phone] Relation: Daughter  Code Status:  DNR Goals of care: Advanced Directive information    02/16/2023   10:38 AM  Advanced Directives  Does Patient Have a Medical Advance Directive? Yes  Type of Estate agent of [place];Living will;Out of facility DNR (pink MOST or yellow form)  Does patient want to make changes to medical advance directive? No - Patient declined  Copy of Healthcare Power of Attorney in Chart? Yes - validated most recent copy scanned in chart (See row information)     Chief Complaint  Patient presents with   Acute Visit    HPI:  Pt is a 89 y.o. female seen today for an acute visit for Covid  PMH significant for severe asthma on dupixent, perforomist, pulmicort, and singulair.   Resident has had symptoms of cough and congestion since 8/2.  CXR ordered on 8/4 which did not show acute process. She is using duonebs and tessalon perles with benefit. No sputum production. Rapid Flu and covid swabs were negative on 8/4.  PCR for Covid returned positive on 03/09/23.  Vitamin C and Zinc started on 8/6.  Symptoms were worsening on 8/8 and paxlovid prescribed. On 8/10  the oncall provider was notified that she was coughing more and choking and doxycycline prescribed.   For my visit she is up and eating. Cough is improving. No sob.  Had some wheezing which resolved. Seems improved per caregiver.    Past Medical History:  Diagnosis Date   Age-related osteoporosis without current pathological fracture     Alzheimer's disease (HCC)    with late onset   Arthritis    Asthma    Deficiency of other specified B group vitamins    Depression    Environmental allergies    mold   Fall    GERD without esophagitis    Hyperlipidemia    Hypertension    Hypertensive kidney disease with CKD (chronic kidney disease)    with stage I through stage 4 CKD    Memory loss    Seasonal allergies    Unspecified osteoarthritis, unspecified site    Past Surgical History:  Procedure Laterality Date   HIP ARTHROPLASTY  06/25/2011   Procedure: ARTHROPLASTY BIPOLAR HIP;  Surgeon: Illene Labrador [place];  Location: [facility];  Service: Orthopedics;  Laterality: Right;   NASAL SINUS SURGERY     VAGINAL HYSTERECTOMY      Allergies  Allergen Reactions   Azithromycin Other (See Comments)    Nausea, weakness    Aspirin Other (See Comments)    Unknown reaction   Erythromycin    Molds & Smuts    Other     Seasonal Allergies.   Septra [Sulfamethoxazole-Trimethoprim]    Sulfa Antibiotics    Sulfonamide Derivatives Other (See Comments)    Unknown reaction    Outpatient Encounter Medications as of 03/15/2023  Medication Sig   doxycycline (VIBRA-TABS) 100 MG tablet Take 1 tablet (100 mg total) by mouth 2 (two) times daily for 10 days.   acetaminophen (TYLENOL) 500 MG tablet Take 500 mg by mouth 3 (three) times daily.  benzonatate (TESSALON) 200 MG capsule Take 1 capsule (200 mg total) by mouth 3 (three) times daily as needed for cough.   budesonide (PULMICORT) 0.5 MG/2ML nebulizer solution Take 0.5 mg by nebulization 2 (two) times daily.   Calcium Carb-Cholecalciferol (CALCIUM 600-D PO) Take 1 tablet by mouth daily.   Cholecalciferol (VITAMIN D) 2000 units tablet Take 2,000 Units by mouth at bedtime.   dextromethorphan (DELSYM) 30 MG/5ML liquid Take 60 mg by mouth every 12 (twelve) hours as needed for cough.   dupilumab (DUPIXENT) 300 MG/2ML prefilled syringe Inject 300 mg into the skin every 14 (fourteen) days.    FLUoxetine (PROZAC) 20 MG capsule Take 20 mg by mouth every morning.   fluticasone (FLONASE) 50 MCG/ACT nasal spray Place 2 sprays into both nostrils daily. 8 am - 11 am   formoterol (PERFOROMIST) 20 MCG/2ML nebulizer solution Take 20 mcg by nebulization 2 (two) times daily.   furosemide (LASIX) 20 MG tablet Take 20 mg by mouth daily. Hold for SBP<120   hydrOXYzine (ATARAX) 25 MG tablet Take 1 tablet (25 mg total) by mouth 2 (two) times daily as needed for anxiety.   ipratropium-albuterol (DUONEB) 0.5-2.5 (3) MG/3ML SOLN Take 3 mLs by nebulization every 6 (six) hours as needed.   memantine (NAMENDA) 10 MG tablet Take 1 tablet (10 mg total) by mouth 2 (two) times daily.   methocarbamol (ROBAXIN) 500 MG tablet Take 1 tablet (500 mg total) by mouth in the morning and at bedtime.   montelukast (SINGULAIR) 10 MG tablet Take 1 tablet (10 mg total) by mouth daily.   nystatin (MYCOSTATIN/NYSTOP) powder Apply 1 Application topically every morning.   omeprazole (PRILOSEC) 20 MG capsule Take 20 mg by mouth every morning. 30 minutes before breakfast.   omeprazole (PRILOSEC) 20 MG capsule Take 20 mg by mouth every evening. 30 minutes before dinner.   Polyethyl Glycol-Propyl Glycol (SYSTANE) 0.4-0.3 % GEL ophthalmic gel Place 1 application into both eyes at bedtime.   polyethylene glycol (MIRALAX / GLYCOLAX) packet Take 17 g by mouth every 3 (three) days.   QUEtiapine (SEROQUEL) 25 MG tablet Take 25 mg by mouth at bedtime.   vitamin B-12 (CYANOCOBALAMIN) 1000 MCG tablet Take 3,000 mcg by mouth daily.   [DISCONTINUED] rivaroxaban (XARELTO) 10 MG TABS tablet Take 1 tablet (10 mg total) by mouth daily.   No facility-administered encounter medications on file as of 03/15/2023.    Review of Systems  Unable to perform ROS: Dementia    Immunization History  Administered Date(s) Administered   Covid-19, Mrna,Vaccine(Spikevax)14yrs and older 06/08/2022   Influenza Inj Mdck Quad Pf 05/18/2019, 05/07/2021    Influenza Split 04/04/2012, 05/03/2013, 05/10/2015   Influenza Whole 04/18/2008, 06/04/2009, 04/03/2010, 03/30/2011   Influenza, High Dose Seasonal PF 04/20/2018, 05/24/2020   Influenza,inj,Quad PF,6+ Mos 03/26/2017   Influenza-Unspecified 03/26/2010, 04/13/2012, 05/08/2013, 05/14/2014, 06/03/2014, 05/13/2015, 04/27/2016, 05/08/2022   Moderna Covid-19 Vaccine Bivalent Booster 80yrs & up 11/28/2021   Moderna SARS-COV2 Booster Vaccination 06/13/2020, 03/25/2021, 05/14/2021   Moderna Sars-Covid-2 Vaccination 09/05/2019, 10/03/2019   Pneumococcal Conjugate-13 03/05/2014   Pneumococcal Polysaccharide-23 06/04/2009   Pneumococcal-Unspecified 03/13/2009   Respiratory Syncytial Virus Vaccine,Recomb Aduvanted(Arexvy) 07/23/2022   Td 03/13/2009, 04/13/2012   Tdap 12/01/2022   Zoster Recombinant(Shingrix) 08/03/2017, 10/01/2017   Zoster, Live 03/21/2010   Pertinent  Health Maintenance Due  Topic Date Due   INFLUENZA VACCINE  03/04/2023   DEXA SCAN  Discontinued      01/11/2020    9:49 AM 09/26/2021    1:02 PM 08/19/2022  2:35 PM 11/27/2022   10:29 AM 02/16/2023    2:48 PM  Fall Risk  Falls in the past year? 0 0 0 0 0  Was there an injury with Fall? 0 0 0 0 0  Fall Risk Category Calculator 0 0 0 0 0  Fall Risk Category (Retired) Low Low     (RETIRED) Patient Fall Risk Level Low fall risk Low fall risk     Patient at Risk for Falls Due to  Impaired balance/gait No Fall Risks History of fall(s) History of fall(s);Impaired balance/gait;Impaired mobility  Fall risk Follow up  Falls evaluation completed Falls evaluation completed Falls evaluation completed Falls evaluation completed;Education provided;Falls prevention discussed   Functional Status Survey:    Vitals:   03/15/23 1252  BP: (!) 140/76  Pulse: 70  Resp: 16  Temp: 98.1 F (36.7 C)  SpO2: 96%   There is no height or weight on file to calculate BMI. Physical Exam Vitals and nursing note reviewed.  Constitutional:       General: She is not in acute distress.    Appearance: She is not diaphoretic.  HENT:     Head: Normocephalic and atraumatic.     Nose: Nose normal.     Mouth/Throat:     Mouth: Mucous membranes are moist.     Pharynx: Oropharynx is clear.  Neck:     Vascular: No JVD.  Cardiovascular:     Rate and Rhythm: Normal rate and regular rhythm.     Heart sounds: No murmur heard. Pulmonary:     Effort: Pulmonary effort is normal. No respiratory distress.     Breath sounds: Wheezing (scattered bilateral mild) present. No rhonchi.  Abdominal:     General: Bowel sounds are normal.     Palpations: Abdomen is soft.     Comments: rotund  Skin:    General: Skin is warm and dry.  Neurological:     Mental Status: She is alert.     Comments: Eyes closed, not able to f/c Intermittently answer questions No focal deficit      Labs reviewed: Recent Labs    08/20/22 0000  NA 137  K 4.3  CL 105  CO2 21  BUN 28*  CREATININE 0.9  CALCIUM 9.5   Recent Labs    08/20/22 0000  AST 17  ALT 12  ALKPHOS 100  ALBUMIN 3.3*   Recent Labs    08/20/22 0000  WBC 5.5  HGB 11.1*  HCT 33*  PLT 186   Lab Results  Component Value Date   TSH 1.52 09/23/2020   No results found for: "HGBA1C" No results found for: "CHOL", "HDL", "LDLCALC", "LDLDIRECT", "TRIG", "CHOLHDL"  Significant Diagnostic Results in last 30 days:  No results found.  Assessment/Plan 1. COVID-19 virus infection Completed paxlovid On Day 3 of doxycycline per oncall provider not sure if it is needed but she is improving so I will leave it as is for now.    2. Severe persistent asthma without complication Followed by pulmonary Would add dexamethasone if worsening but seems to be doing ok Continue nebulizers and Dupixent  3. Advanced care planning/counseling discussion I discussed her care with her daughter Ms. [place]. Ms. Vatter is 33 years old with advancing dementia and multiple health problems. She has a DNR  but does not have a most form. I recommend that we avoid hospitalizations and fill out the form. She will be by 8/13 to fill out the most form and coordinate with the  nurse.     Family/ staff Communication: see above  Labs/tests ordered:  NA

## 2023-03-16 ENCOUNTER — Encounter: Payer: Self-pay | Admitting: Adult Health

## 2023-03-29 ENCOUNTER — Non-Acute Institutional Stay (SKILLED_NURSING_FACILITY): Payer: Medicare Other | Admitting: Internal Medicine

## 2023-03-29 ENCOUNTER — Encounter: Payer: Self-pay | Admitting: Internal Medicine

## 2023-03-29 DIAGNOSIS — K219 Gastro-esophageal reflux disease without esophagitis: Secondary | ICD-10-CM

## 2023-03-29 DIAGNOSIS — J4550 Severe persistent asthma, uncomplicated: Secondary | ICD-10-CM | POA: Diagnosis not present

## 2023-03-29 DIAGNOSIS — F458 Other somatoform disorders: Secondary | ICD-10-CM

## 2023-03-29 DIAGNOSIS — F02818 Dementia in other diseases classified elsewhere, unspecified severity, with other behavioral disturbance: Secondary | ICD-10-CM

## 2023-03-29 DIAGNOSIS — N1832 Chronic kidney disease, stage 3b: Secondary | ICD-10-CM

## 2023-03-29 DIAGNOSIS — I10 Essential (primary) hypertension: Secondary | ICD-10-CM

## 2023-03-29 DIAGNOSIS — G301 Alzheimer's disease with late onset: Secondary | ICD-10-CM

## 2023-03-29 DIAGNOSIS — F325 Major depressive disorder, single episode, in full remission: Secondary | ICD-10-CM

## 2023-03-29 NOTE — Progress Notes (Signed)
Location:  [facility] Room Number: 121A Place of Service:  SNF [phone]) Provider:  Mahlon Gammon, MD   Mahlon Gammon, MD  Patient Care Team: Mahlon Gammon, MD as PCP - General (Internal Medicine) Storm Frisk, MD (Pulmonary Disease)  Extended Emergency Contact Information Primary Emergency Contact: Mel Almond of [place] Home Phone: [phone] Mobile Phone: [phone] Relation: Daughter  Code Status:  DNR Goals of care: Advanced Directive information    03/29/2023   10:44 AM  Advanced Directives  Does Patient Have a Medical Advance Directive? Yes  Type of Estate agent of [place];Living will;Out of facility DNR (pink MOST or yellow form)  Does patient want to make changes to medical advance directive? No - Patient declined  Copy of Healthcare Power of Attorney in Chart? Yes - validated most recent copy scanned in chart (See row information)     Chief Complaint  Patient presents with   Medical Management of Chronic Issues    Patient being seen for a routine visit     Immunizations    Discuss the need for flu vaccine     HPI:  Pt is a 89 y.o. female seen today for medical management of chronic diseases.   Lives in SNF in [place]  Patient has a history of Alzheimer's dementia dependent for her ADLs Recent Decline with behaviors Vistaril seems to be helping with behaviors  Continues to have Bruxism issue Robaxin did not help    Had Covid this month treated with Paxlovid  Osteoporosis and knee osteoarthritis CKD stage 3b Severe asthma Eosinophilic Sees Pulmonary stable in Dupixent HLD and hypertension  She is hoyer dependent Helps with feeding Daughter did sign MOST form and now she is Comfort care Wt Readings from Last 3 Encounters:  03/29/23 161 lb (73 kg)  03/01/23 156 lb 3.2 oz (70.9 kg)  02/16/23 156 lb 3.2 oz (70.9 kg)     Past Medical History:  Diagnosis Date    Age-related osteoporosis without current pathological fracture    Alzheimer's disease (HCC)    with late onset   Arthritis    Asthma    Deficiency of other specified B group vitamins    Depression    Environmental allergies    mold   Fall    GERD without esophagitis    Hyperlipidemia    Hypertension    Hypertensive kidney disease with CKD (chronic kidney disease)    with stage I through stage 4 CKD    Memory loss    Seasonal allergies    Unspecified osteoarthritis, unspecified site    Past Surgical History:  Procedure Laterality Date   HIP ARTHROPLASTY  06/25/2011   Procedure: ARTHROPLASTY BIPOLAR HIP;  Surgeon: Illene Labrador [place];  Location: [facility];  Service: Orthopedics;  Laterality: Right;   NASAL SINUS SURGERY     VAGINAL HYSTERECTOMY      Allergies  Allergen Reactions   Azithromycin Other (See Comments)    Nausea, weakness    Aspirin Other (See Comments)    Unknown reaction   Erythromycin    Molds & Smuts    Other     Seasonal Allergies.   Septra [Sulfamethoxazole-Trimethoprim]    Sulfa Antibiotics    Sulfonamide Derivatives Other (See Comments)    Unknown reaction    Outpatient Encounter Medications as of 03/29/2023  Medication Sig   acetaminophen (TYLENOL) 500 MG tablet Take 500 mg by mouth 3 (three) times daily.   benzonatate (  TESSALON) 200 MG capsule Take 1 capsule (200 mg total) by mouth 3 (three) times daily as needed for cough.   budesonide (PULMICORT) 0.5 MG/2ML nebulizer solution Take 0.5 mg by nebulization 2 (two) times daily.   Calcium Carb-Cholecalciferol (CALCIUM 600-D PO) Take 1 tablet by mouth daily.   Cholecalciferol (VITAMIN D) 2000 units tablet Take 2,000 Units by mouth at bedtime.   dextromethorphan (DELSYM) 30 MG/5ML liquid Take 60 mg by mouth every 12 (twelve) hours as needed for cough.   dupilumab (DUPIXENT) 300 MG/2ML prefilled syringe Inject 300 mg into the skin every 14 (fourteen) days.   FLUoxetine (PROZAC) 20 MG capsule Take 20 mg by  mouth every morning.   fluticasone (FLONASE) 50 MCG/ACT nasal spray Place 2 sprays into both nostrils daily. 8 am - 11 am   formoterol (PERFOROMIST) 20 MCG/2ML nebulizer solution Take 20 mcg by nebulization 2 (two) times daily.   furosemide (LASIX) 20 MG tablet Take 20 mg by mouth daily. Hold for SBP<120   hydrOXYzine (ATARAX) 25 MG tablet Take 1 tablet (25 mg total) by mouth 2 (two) times daily as needed for anxiety.   ipratropium-albuterol (DUONEB) 0.5-2.5 (3) MG/3ML SOLN Take 3 mLs by nebulization every 6 (six) hours as needed.   memantine (NAMENDA) 10 MG tablet Take 1 tablet (10 mg total) by mouth 2 (two) times daily.   montelukast (SINGULAIR) 10 MG tablet Take 1 tablet (10 mg total) by mouth daily.   nystatin (MYCOSTATIN/NYSTOP) powder Apply 1 Application topically every morning.   omeprazole (PRILOSEC) 20 MG capsule Take 20 mg by mouth every morning. 30 minutes before breakfast.   omeprazole (PRILOSEC) 20 MG capsule Take 20 mg by mouth every evening. 30 minutes before dinner.   Polyethyl Glycol-Propyl Glycol (SYSTANE) 0.4-0.3 % GEL ophthalmic gel Place 1 application into both eyes at bedtime.   polyethylene glycol (MIRALAX / GLYCOLAX) packet Take 17 g by mouth every 3 (three) days.   QUEtiapine (SEROQUEL) 25 MG tablet Take 25 mg by mouth at bedtime.   vitamin B-12 (CYANOCOBALAMIN) 1000 MCG tablet Take 3,000 mcg by mouth daily.   methocarbamol (ROBAXIN) 500 MG tablet Take 1 tablet (500 mg total) by mouth in the morning and at bedtime. (Patient not taking: Reported on 03/29/2023)   [DISCONTINUED] rivaroxaban (XARELTO) 10 MG TABS tablet Take 1 tablet (10 mg total) by mouth daily.   No facility-administered encounter medications on file as of 03/29/2023.    Review of Systems  Unable to perform ROS: Dementia    Immunization History  Administered Date(s) Administered   Covid-19, Mrna,Vaccine(Spikevax)64yrs and older 06/08/2022   Influenza Inj Mdck Quad Pf 05/18/2019, 05/07/2021   Influenza  Split 04/04/2012, 05/03/2013, 05/10/2015   Influenza Whole 04/18/2008, 06/04/2009, 04/03/2010, 03/30/2011   Influenza, High Dose Seasonal PF 04/20/2018, 05/24/2020   Influenza,inj,Quad PF,6+ Mos 03/26/2017   Influenza-Unspecified 03/26/2010, 04/13/2012, 05/08/2013, 05/14/2014, 06/03/2014, 05/13/2015, 04/27/2016, 05/08/2022   Moderna Covid-19 Vaccine Bivalent Booster 63yrs & up 11/28/2021   Moderna SARS-COV2 Booster Vaccination 06/13/2020, 03/25/2021, 05/14/2021   Moderna Sars-Covid-2 Vaccination 09/05/2019, 10/03/2019   Pfizer Covid-19 Vaccine Bivalent Booster 19yrs & up 11/26/2022   Pneumococcal Conjugate-13 03/05/2014   Pneumococcal Polysaccharide-23 06/04/2009   Pneumococcal-Unspecified 03/13/2009   Respiratory Syncytial Virus Vaccine,Recomb Aduvanted(Arexvy) 07/23/2022   Td 03/13/2009, 04/13/2012   Tdap 12/01/2022   Zoster Recombinant(Shingrix) 08/03/2017, 10/01/2017   Zoster, Live 03/21/2010   Pertinent  Health Maintenance Due  Topic Date Due   INFLUENZA VACCINE  03/04/2023   DEXA SCAN  Discontinued  01/11/2020    9:49 AM 09/26/2021    1:02 PM 08/19/2022    2:35 PM 11/27/2022   10:29 AM 02/16/2023    2:48 PM  Fall Risk  Falls in the past year? 0 0 0 0 0  Was there an injury with Fall? 0 0 0 0 0  Fall Risk Category Calculator 0 0 0 0 0  Fall Risk Category (Retired) Low Low     (RETIRED) Patient Fall Risk Level Low fall risk Low fall risk     Patient at Risk for Falls Due to  Impaired balance/gait No Fall Risks History of fall(s) History of fall(s);Impaired balance/gait;Impaired mobility  Fall risk Follow up  Falls evaluation completed Falls evaluation completed Falls evaluation completed Falls evaluation completed;Education provided;Falls prevention discussed   Functional Status Survey:    Vitals:   03/29/23 1042  BP: 107/79  Pulse: 61  Resp: 15  Temp: 98.2 F (36.8 C)  TempSrc: Temporal  SpO2: 98%  Weight: 161 lb (73 kg)  Height: 5\' 2"  (1.575 m)   Body mass  index is 29.45 kg/m. Physical Exam Vitals reviewed.  Constitutional:      Appearance: Normal appearance.  HENT:     Head: Normocephalic.     Nose: Nose normal.     Mouth/Throat:     Mouth: Mucous membranes are moist.     Pharynx: Oropharynx is clear.  Eyes:     Pupils: Pupils are equal, round, and reactive to light.  Cardiovascular:     Rate and Rhythm: Normal rate and regular rhythm.     Pulses: Normal pulses.     Heart sounds: Normal heart sounds. No murmur heard. Pulmonary:     Effort: Pulmonary effort is normal.     Breath sounds: Normal breath sounds.  Abdominal:     General: Abdomen is flat. Bowel sounds are normal.     Palpations: Abdomen is soft.  Musculoskeletal:        General: No swelling.     Cervical back: Neck supple.  Skin:    General: Skin is warm.  Neurological:     General: No focal deficit present.     Mental Status: She is alert.  Psychiatric:        Mood and Affect: Mood normal.        Thought Content: Thought content normal.     Labs reviewed: Recent Labs    08/20/22 0000  NA 137  K 4.3  CL 105  CO2 21  BUN 28*  CREATININE 0.9  CALCIUM 9.5   Recent Labs    08/20/22 0000  AST 17  ALT 12  ALKPHOS 100  ALBUMIN 3.3*   Recent Labs    08/20/22 0000  WBC 5.5  HGB 11.1*  HCT 33*  PLT 186   Lab Results  Component Value Date   TSH 1.52 09/23/2020   No results found for: "HGBA1C" No results found for: "CHOL", "HDL", "LDLCALC", "LDLDIRECT", "TRIG", "CHOLHDL"  Significant Diagnostic Results in last 30 days:  No results found.  Assessment/Plan 1. Severe persistent asthma without complication Pulmicort and Dupixent  2. Bruxism Continue Vistaril 25 mg TID PRN for this Failed Robaxin    3. Stage 3b chronic kidney disease (HCC) Creat done recently was stable  4. Essential hypertension Controlled on Lasix  5. Gastro-esophageal reflux disease without esophagitis Prilosec   6. Major depressive disorder, single episode, in  remission [facility]) Per nurses they think her behaviors have been worse since dose of Prozac  Reduced  7. Dementia of the Alzheimer's type, with late onset, with delusions (HCC) MMSE 7/30 Now she is Comfort care On Namenda and Seroquel for behaviors at night    Family/ staff Communication:   Labs/tests ordered:

## 2023-04-28 ENCOUNTER — Encounter: Payer: Self-pay | Admitting: Orthopedic Surgery

## 2023-04-28 ENCOUNTER — Non-Acute Institutional Stay (SKILLED_NURSING_FACILITY): Payer: Medicare Other | Admitting: Orthopedic Surgery

## 2023-04-28 DIAGNOSIS — F419 Anxiety disorder, unspecified: Secondary | ICD-10-CM

## 2023-04-28 DIAGNOSIS — F458 Other somatoform disorders: Secondary | ICD-10-CM

## 2023-04-28 DIAGNOSIS — I10 Essential (primary) hypertension: Secondary | ICD-10-CM | POA: Diagnosis not present

## 2023-04-28 DIAGNOSIS — N1831 Chronic kidney disease, stage 3a: Secondary | ICD-10-CM

## 2023-04-28 DIAGNOSIS — G301 Alzheimer's disease with late onset: Secondary | ICD-10-CM

## 2023-04-28 DIAGNOSIS — K219 Gastro-esophageal reflux disease without esophagitis: Secondary | ICD-10-CM

## 2023-04-28 DIAGNOSIS — F02818 Dementia in other diseases classified elsewhere, unspecified severity, with other behavioral disturbance: Secondary | ICD-10-CM

## 2023-04-28 DIAGNOSIS — J4540 Moderate persistent asthma, uncomplicated: Secondary | ICD-10-CM | POA: Diagnosis not present

## 2023-04-28 DIAGNOSIS — F339 Major depressive disorder, recurrent, unspecified: Secondary | ICD-10-CM

## 2023-04-28 NOTE — Progress Notes (Signed)
Location:  [facility] Room Number: 121A Place of Service:  SNF [phone]) Provider:  Hazle Nordmann.,NP  Mahlon Gammon, MD  Patient Care Team: Mahlon Gammon, MD as PCP - General (Internal Medicine) Storm Frisk, MD (Pulmonary Disease)  Extended Emergency Contact Information Primary Emergency Contact: Mel Almond of [place] Home Phone: [phone] Mobile Phone: [phone] Relation: Daughter  Code Status:  DNR Goals of care: Advanced Directive information    04/28/2023    3:13 PM  Advanced Directives  Does Patient Have a Medical Advance Directive? Yes  Type of Estate agent of [place];Living will;Out of facility DNR (pink MOST or yellow form)  Does patient want to make changes to medical advance directive? No - Patient declined  Copy of Healthcare Power of Attorney in Chart? Yes - validated most recent copy scanned in chart (See row information)     Chief Complaint  Patient presents with   Medical Management of Chronic Issues    Patient is being seen for a routine visit    Immunizations    Patient is due for flu vaccine    HPI:  Pt is a 89 y.o. female seen today for medical management of chronic diseases.     Past Medical History:  Diagnosis Date   Age-related osteoporosis without current pathological fracture    Alzheimer's disease (HCC)    with late onset   Arthritis    Asthma    Deficiency of other specified B group vitamins    Depression    Environmental allergies    mold   Fall    GERD without esophagitis    Hyperlipidemia    Hypertension    Hypertensive kidney disease with CKD (chronic kidney disease)    with stage I through stage 4 CKD    Memory loss    Seasonal allergies    Unspecified osteoarthritis, unspecified site    Past Surgical History:  Procedure Laterality Date   HIP ARTHROPLASTY  06/25/2011   Procedure: ARTHROPLASTY BIPOLAR HIP;  Surgeon: Illene Labrador  [place];  Location: [facility];  Service: Orthopedics;  Laterality: Right;   NASAL SINUS SURGERY     VAGINAL HYSTERECTOMY      Allergies  Allergen Reactions   Azithromycin Other (See Comments)    Nausea, weakness    Aspirin Other (See Comments)    Unknown reaction   Erythromycin    Molds & Smuts    Other     Seasonal Allergies.   Septra [Sulfamethoxazole-Trimethoprim]    Sulfa Antibiotics    Sulfonamide Derivatives Other (See Comments)    Unknown reaction    Outpatient Encounter Medications as of 04/28/2023  Medication Sig   acetaminophen (TYLENOL) 500 MG tablet Take 500 mg by mouth 3 (three) times daily.   benzonatate (TESSALON) 200 MG capsule Take 1 capsule (200 mg total) by mouth 3 (three) times daily as needed for cough.   budesonide (PULMICORT) 0.5 MG/2ML nebulizer solution Take 0.5 mg by nebulization 2 (two) times daily.   Calcium Carb-Cholecalciferol (CALCIUM 600-D PO) Take 1 tablet by mouth daily.   Cholecalciferol (VITAMIN D) 2000 units tablet Take 2,000 Units by mouth at bedtime.   dextromethorphan (DELSYM) 30 MG/5ML liquid Take 60 mg by mouth every 12 (twelve) hours as needed for cough.   dupilumab (DUPIXENT) 300 MG/2ML prefilled syringe Inject 300 mg into the skin every 14 (fourteen) days.   FLUoxetine (PROZAC) 40 MG capsule Take 40 mg by mouth daily.   fluticasone (  FLONASE) 50 MCG/ACT nasal spray Place 2 sprays into both nostrils daily. 8 am - 11 am   formoterol (PERFOROMIST) 20 MCG/2ML nebulizer solution Take 20 mcg by nebulization 2 (two) times daily.   furosemide (LASIX) 20 MG tablet Take 20 mg by mouth daily. Hold for SBP<120   hydrOXYzine (ATARAX) 25 MG tablet Take 25 mg by mouth 3 (three) times daily as needed for anxiety.   ipratropium-albuterol (DUONEB) 0.5-2.5 (3) MG/3ML SOLN Take 3 mLs by nebulization every 6 (six) hours as needed.   memantine (NAMENDA) 10 MG tablet Take 1 tablet (10 mg total) by mouth 2 (two) times daily.   montelukast (SINGULAIR) 10 MG  tablet Take 1 tablet (10 mg total) by mouth daily.   nystatin (MYCOSTATIN/NYSTOP) powder Apply 1 Application topically every morning.   omeprazole (PRILOSEC) 20 MG capsule Take 20 mg by mouth every morning. 30 minutes before breakfast.   omeprazole (PRILOSEC) 20 MG capsule Take 20 mg by mouth every evening. 30 minutes before dinner.   Polyethyl Glycol-Propyl Glycol (SYSTANE) 0.4-0.3 % GEL ophthalmic gel Place 1 application into both eyes at bedtime.   polyethylene glycol (MIRALAX / GLYCOLAX) packet Take 17 g by mouth every 3 (three) days.   QUEtiapine (SEROQUEL) 25 MG tablet Take 25 mg by mouth at bedtime.   vitamin B-12 (CYANOCOBALAMIN) 1000 MCG tablet Take 3,000 mcg by mouth daily.   [DISCONTINUED] FLUoxetine (PROZAC) 20 MG capsule Take 20 mg by mouth every morning.   [DISCONTINUED] hydrOXYzine (ATARAX) 25 MG tablet Take 1 tablet (25 mg total) by mouth 2 (two) times daily as needed for anxiety. (Patient taking differently: Take 25 mg by mouth every 6 (six) hours as needed for anxiety.)   [DISCONTINUED] rivaroxaban (XARELTO) 10 MG TABS tablet Take 1 tablet (10 mg total) by mouth daily.   No facility-administered encounter medications on file as of 04/28/2023.    Review of Systems  Immunization History  Administered Date(s) Administered   Influenza Inj Mdck Quad Pf 05/18/2019, 05/07/2021   Influenza Split 04/04/2012, 05/03/2013, 05/10/2015   Influenza Whole 04/18/2008, 06/04/2009, 04/03/2010, 03/30/2011   Influenza, High Dose Seasonal PF 04/20/2018, 05/24/2020   Influenza,inj,Quad PF,6+ Mos 03/26/2017   Influenza-Unspecified 03/26/2010, 04/13/2012, 05/08/2013, 05/14/2014, 06/03/2014, 05/13/2015, 04/27/2016, 05/08/2022   Moderna Covid-19 Fall Seasonal Vaccine 65yrs & older 06/08/2022   Moderna Covid-19 Vaccine Bivalent Booster 46yrs & up 11/28/2021   Moderna SARS-COV2 Booster Vaccination 06/13/2020, 03/25/2021, 05/14/2021   Moderna Sars-Covid-2 Vaccination 09/05/2019, 10/03/2019   Pfizer  Covid-19 Vaccine Bivalent Booster 6yrs & up 11/26/2022   Pneumococcal Conjugate-13 03/05/2014   Pneumococcal Polysaccharide-23 06/04/2009   Pneumococcal-Unspecified 03/13/2009   Respiratory Syncytial Virus Vaccine,Recomb Aduvanted(Arexvy) 07/23/2022   Td 03/13/2009, 04/13/2012   Tdap 12/01/2022   Zoster Recombinant(Shingrix) 08/03/2017, 10/01/2017   Zoster, Live 03/21/2010   Pertinent  Health Maintenance Due  Topic Date Due   INFLUENZA VACCINE  03/04/2023   DEXA SCAN  Discontinued      01/11/2020    9:49 AM 09/26/2021    1:02 PM 08/19/2022    2:35 PM 11/27/2022   10:29 AM 02/16/2023    2:48 PM  Fall Risk  Falls in the past year? 0 0 0 0 0  Was there an injury with Fall? 0 0 0 0 0  Fall Risk Category Calculator 0 0 0 0 0  Fall Risk Category (Retired) Low Low     (RETIRED) Patient Fall Risk Level Low fall risk Low fall risk     Patient at Risk for Falls Due  to  Impaired balance/gait No Fall Risks History of fall(s) History of fall(s);Impaired balance/gait;Impaired mobility  Fall risk Follow up  Falls evaluation completed Falls evaluation completed Falls evaluation completed Falls evaluation completed;Education provided;Falls prevention discussed   Functional Status Survey:    Vitals:   04/28/23 1503  BP: 124/74  Pulse: 71  Resp: 14  Temp: (!) 97.2 F (36.2 C)  TempSrc: Temporal  SpO2: 95%  Weight: 161 lb 6.4 oz (73.2 kg)  Height: 5\' 2"  (1.575 m)   Body mass index is 29.52 kg/m. Physical Exam  Labs reviewed: Recent Labs    08/20/22 0000 03/15/23 0000  NA 137 139  K 4.3 4.5  CL 105 103  CO2 21 22  BUN 28* 21  CREATININE 0.9 0.9  CALCIUM 9.5 9.3   Recent Labs    08/20/22 0000 03/15/23 0000  AST 17 17  ALT 12 12  ALKPHOS 100 113  ALBUMIN 3.3* 3.6   Recent Labs    08/20/22 0000  WBC 5.5  HGB 11.1*  HCT 33*  PLT 186   Lab Results  Component Value Date   TSH 1.52 09/23/2020   No results found for: "HGBA1C" No results found for: "CHOL", "HDL",  "LDLCALC", "LDLDIRECT", "TRIG", "CHOLHDL"  Significant Diagnostic Results in last 30 days:  No results found.  Assessment/Plan There are no diagnoses linked to this encounter.   Family/ staff Communication:   Labs/tests ordered:

## 2023-04-28 NOTE — Progress Notes (Signed)
Location:  [facility] Room Number: 121A Place of Service:  SNF [phone]) Provider:  Octavia Heir, NP   Mahlon Gammon, MD  Patient Care Team: Mahlon Gammon, MD as PCP - General (Internal Medicine) Storm Frisk, MD (Pulmonary Disease)  Extended Emergency Contact Information Primary Emergency Contact: Mel Almond of [place] Home Phone: [phone] Mobile Phone: [phone] Relation: Daughter  Code Status:  DNR Goals of care: Advanced Directive information    03/29/2023   10:44 AM  Advanced Directives  Does Patient Have a Medical Advance Directive? Yes  Type of Estate agent of [place];Living will;Out of facility DNR (pink MOST or yellow form)  Does patient want to make changes to medical advance directive? No - Patient declined  Copy of Healthcare Power of Attorney in Chart? Yes - validated most recent copy scanned in chart (See row information)     Chief Complaint  Patient presents with   Medical Management of Chronic Issues    Patient is being seen for a routine visit    Immunizations    Patient is due for flu vaccine    HPI:  Pt is a 89 y.o. female seen today for medical management of chronic diseases.    She currently resides on the skilled nursing unit at [facility]. PMH: HTN, allergic rhinitis, asthma, GERD, Alzheimer's, OA, osteoporosis, CKD 3b, and depression.   HTN- BUN/creat 21/0.94 03/17/2023, remains in lasix daily  CKD- see above, GFR 55 03/17/2023 Alzheimer's with delusions- CT head 2018 chronic ischemic microvascular disease, behaviors with ADLs, non ambulatory, dependent with ADLs, sleeping more, remains on Namenda and vistaril prn Persistent Asthma- followed by Dr. Judeth Horn seen 06/11> f/u in 1 year, remains on Dupixent, nebulized ICS/LABA and duonebs, Singulair and Flonase Bruxism- ongoing, remains on Seroquel  GERD- hgb 11.1 08/20/2022, remains on  Prilosec Depression/Anxiety- no recent mood changes, Na+ 139 03/17/2023, remains on Prozac> recently increased  03/2023 diagnosed with COVID, resolved with Paxlovid. MOST form signed, goals of care comfort based.   No recent falls or injuries.   Recent weights:   09/01- 161.4 lbs  08/01- 161.4 lbs  07/01- 156.2 lbs  Recent blood pressures:  09/22- 124/74   09/20- 119/56  09/12- 143/63   Past Medical History:  Diagnosis Date   Age-related osteoporosis without current pathological fracture    Alzheimer's disease (HCC)    with late onset   Arthritis    Asthma    Deficiency of other specified B group vitamins    Depression    Environmental allergies    mold   Fall    GERD without esophagitis    Hyperlipidemia    Hypertension    Hypertensive kidney disease with CKD (chronic kidney disease)    with stage I through stage 4 CKD    Memory loss    Seasonal allergies    Unspecified osteoarthritis, unspecified site    Past Surgical History:  Procedure Laterality Date   HIP ARTHROPLASTY  06/25/2011   Procedure: ARTHROPLASTY BIPOLAR HIP;  Surgeon: Illene Labrador [place];  Location: [facility];  Service: Orthopedics;  Laterality: Right;   NASAL SINUS SURGERY     VAGINAL HYSTERECTOMY      Allergies  Allergen Reactions   Azithromycin Other (See Comments)    Nausea, weakness    Aspirin Other (See Comments)    Unknown reaction   Erythromycin    Molds & Smuts    Other     Seasonal Allergies.  Septra [Sulfamethoxazole-Trimethoprim]    Sulfa Antibiotics    Sulfonamide Derivatives Other (See Comments)    Unknown reaction    Outpatient Encounter Medications as of 04/28/2023  Medication Sig   benzonatate (TESSALON) 200 MG capsule Take 1 capsule (200 mg total) by mouth 3 (three) times daily as needed for cough.   Calcium Carb-Cholecalciferol (CALCIUM 600-D PO) Take 1 tablet by mouth daily.   dextromethorphan (DELSYM) 30 MG/5ML liquid Take 60 mg by mouth every 12 (twelve) hours as  needed for cough.   dupilumab (DUPIXENT) 300 MG/2ML prefilled syringe Inject 300 mg into the skin every 14 (fourteen) days.   fluticasone (FLONASE) 50 MCG/ACT nasal spray Place 2 sprays into both nostrils daily. 8 am - 11 am   formoterol (PERFOROMIST) 20 MCG/2ML nebulizer solution Take 20 mcg by nebulization 2 (two) times daily.   furosemide (LASIX) 20 MG tablet Take 20 mg by mouth daily. Hold for SBP<120   hydrOXYzine (ATARAX) 25 MG tablet Take 1 tablet (25 mg total) by mouth 2 (two) times daily as needed for anxiety. (Patient taking differently: Take 25 mg by mouth every 6 (six) hours as needed for anxiety.)   ipratropium-albuterol (DUONEB) 0.5-2.5 (3) MG/3ML SOLN Take 3 mLs by nebulization every 6 (six) hours as needed.   memantine (NAMENDA) 10 MG tablet Take 1 tablet (10 mg total) by mouth 2 (two) times daily.   nystatin (MYCOSTATIN/NYSTOP) powder Apply 1 Application topically every morning.   omeprazole (PRILOSEC) 20 MG capsule Take 20 mg by mouth every morning. 30 minutes before breakfast.   omeprazole (PRILOSEC) 20 MG capsule Take 20 mg by mouth every evening. 30 minutes before dinner.   polyethylene glycol (MIRALAX / GLYCOLAX) packet Take 17 g by mouth every 3 (three) days.   [DISCONTINUED] FLUoxetine (PROZAC) 20 MG capsule Take 20 mg by mouth every morning.   acetaminophen (TYLENOL) 500 MG tablet Take 500 mg by mouth 3 (three) times daily.   budesonide (PULMICORT) 0.5 MG/2ML nebulizer solution Take 0.5 mg by nebulization 2 (two) times daily.   Cholecalciferol (VITAMIN D) 2000 units tablet Take 2,000 Units by mouth at bedtime.   montelukast (SINGULAIR) 10 MG tablet Take 1 tablet (10 mg total) by mouth daily.   Polyethyl Glycol-Propyl Glycol (SYSTANE) 0.4-0.3 % GEL ophthalmic gel Place 1 application into both eyes at bedtime.   QUEtiapine (SEROQUEL) 25 MG tablet Take 25 mg by mouth at bedtime.   vitamin B-12 (CYANOCOBALAMIN) 1000 MCG tablet Take 3,000 mcg by mouth daily.   [DISCONTINUED]  rivaroxaban (XARELTO) 10 MG TABS tablet Take 1 tablet (10 mg total) by mouth daily.   No facility-administered encounter medications on file as of 04/28/2023.    Review of Systems  Unable to perform ROS: Dementia    Immunization History  Administered Date(s) Administered   Influenza Inj Mdck Quad Pf 05/18/2019, 05/07/2021   Influenza Split 04/04/2012, 05/03/2013, 05/10/2015   Influenza Whole 04/18/2008, 06/04/2009, 04/03/2010, 03/30/2011   Influenza, High Dose Seasonal PF 04/20/2018, 05/24/2020   Influenza,inj,Quad PF,6+ Mos 03/26/2017   Influenza-Unspecified 03/26/2010, 04/13/2012, 05/08/2013, 05/14/2014, 06/03/2014, 05/13/2015, 04/27/2016, 05/08/2022   Moderna Covid-19 Fall Seasonal Vaccine 21yrs & older 06/08/2022   Moderna Covid-19 Vaccine Bivalent Booster 41yrs & up 11/28/2021   Moderna SARS-COV2 Booster Vaccination 06/13/2020, 03/25/2021, 05/14/2021   Moderna Sars-Covid-2 Vaccination 09/05/2019, 10/03/2019   Pfizer Covid-19 Vaccine Bivalent Booster 76yrs & up 11/26/2022   Pneumococcal Conjugate-13 03/05/2014   Pneumococcal Polysaccharide-23 06/04/2009   Pneumococcal-Unspecified 03/13/2009   Respiratory Syncytial Virus Vaccine,Recomb Aduvanted(Arexvy) 07/23/2022  Td 03/13/2009, 04/13/2012   Tdap 12/01/2022   Zoster Recombinant(Shingrix) 08/03/2017, 10/01/2017   Zoster, Live 03/21/2010   Pertinent  Health Maintenance Due  Topic Date Due   INFLUENZA VACCINE  03/04/2023   DEXA SCAN  Discontinued      01/11/2020    9:49 AM 09/26/2021    1:02 PM 08/19/2022    2:35 PM 11/27/2022   10:29 AM 02/16/2023    2:48 PM  Fall Risk  Falls in the past year? 0 0 0 0 0  Was there an injury with Fall? 0 0 0 0 0  Fall Risk Category Calculator 0 0 0 0 0  Fall Risk Category (Retired) Low Low     (RETIRED) Patient Fall Risk Level Low fall risk Low fall risk     Patient at Risk for Falls Due to  Impaired balance/gait No Fall Risks History of fall(s) History of fall(s);Impaired  balance/gait;Impaired mobility  Fall risk Follow up  Falls evaluation completed Falls evaluation completed Falls evaluation completed Falls evaluation completed;Education provided;Falls prevention discussed   Functional Status Survey:    Vitals:   04/28/23 1503  BP: 124/74  Pulse: 71  Resp: 14  Temp: (!) 97.2 F (36.2 C)  TempSrc: Temporal  SpO2: 95%  Weight: 161 lb 6.4 oz (73.2 kg)  Height: 5\' 2"  (1.575 m)   Body mass index is 29.52 kg/m. Physical Exam Vitals reviewed.  Constitutional:      General: She is not in acute distress. HENT:     Head: Normocephalic.     Right Ear: There is no impacted cerumen.     Left Ear: There is no impacted cerumen.     Nose: Nose normal.     Mouth/Throat:     Mouth: Mucous membranes are moist.  Eyes:     General:        Right eye: No discharge.        Left eye: No discharge.  Cardiovascular:     Rate and Rhythm: Normal rate and regular rhythm.     Pulses: Normal pulses.     Heart sounds: Normal heart sounds.  Pulmonary:     Effort: Pulmonary effort is normal. No respiratory distress.     Breath sounds: Normal breath sounds. No wheezing or rales.  Abdominal:     General: Bowel sounds are normal. There is no distension.     Palpations: Abdomen is soft.     Tenderness: There is no abdominal tenderness.  Musculoskeletal:     Cervical back: Neck supple.     Right lower leg: No edema.     Left lower leg: No edema.  Skin:    General: Skin is warm and dry.     Capillary Refill: Capillary refill takes less than 2 seconds.     Comments: Flaking skin to lower extremities  Neurological:     General: No focal deficit present.     Mental Status: She is alert. Mental status is at baseline.     Motor: Weakness present.     Gait: Gait abnormal.     Comments: Hoyer/juditta  Psychiatric:        Mood and Affect: Mood normal.     Comments: Follows some commands, alert to self/familiar face     Labs reviewed: Recent Labs    08/20/22 0000  03/15/23 0000  NA 137 139  K 4.3 4.5  CL 105 103  CO2 21 22  BUN 28* 21  CREATININE 0.9 0.9  CALCIUM 9.5  9.3   Recent Labs    08/20/22 0000 03/15/23 0000  AST 17 17  ALT 12 12  ALKPHOS 100 113  ALBUMIN 3.3* 3.6   Recent Labs    08/20/22 0000  WBC 5.5  HGB 11.1*  HCT 33*  PLT 186   Lab Results  Component Value Date   TSH 1.52 09/23/2020   No results found for: "HGBA1C" No results found for: "CHOL", "HDL", "LDLCALC", "LDLDIRECT", "TRIG", "CHOLHDL"  Significant Diagnostic Results in last 30 days:  No results found.  Assessment/Plan 1. Essential hypertension - controlled  - on furosemide  2. Stage 3a chronic kidney disease (HCC) - encourage hydration with water - avoid NSAIDS  3. Dementia of the Alzheimer's type, with late onset, with delusions (HCC) - behaviors with ADLs - sleeping more - hoyer transfer - dependent with ADLs - cont vistaril prn - cont skilled nursing   4. Moderate persistent asthma without complication - no recent exacerbations - cont Dupixent, nebulized ICS/LABA and duonebs, Singulair and Flonase   5. Bruxism - ongoing - 09/17 c/o dental pain - cont Seroquel  6. Gastroesophageal reflux disease without esophagitis - hgb stable - cont omeprazole  7. Anxiety - associated with ADLs/ sundowning - cont hydroxyzine prn  8. Recurrent depression (HCC) - no mood changes - Na+ 139 03/17/2023 - cont Prozac    Family/ staff Communication: plan discussed with patient and nurse  Labs/tests ordered:  none

## 2023-05-26 ENCOUNTER — Encounter: Payer: Self-pay | Admitting: Orthopedic Surgery

## 2023-05-26 ENCOUNTER — Non-Acute Institutional Stay (SKILLED_NURSING_FACILITY): Payer: Self-pay | Admitting: Orthopedic Surgery

## 2023-05-26 DIAGNOSIS — F02818 Dementia in other diseases classified elsewhere, unspecified severity, with other behavioral disturbance: Secondary | ICD-10-CM

## 2023-05-26 DIAGNOSIS — N1831 Chronic kidney disease, stage 3a: Secondary | ICD-10-CM | POA: Diagnosis not present

## 2023-05-26 DIAGNOSIS — L819 Disorder of pigmentation, unspecified: Secondary | ICD-10-CM | POA: Diagnosis not present

## 2023-05-26 DIAGNOSIS — F419 Anxiety disorder, unspecified: Secondary | ICD-10-CM

## 2023-05-26 DIAGNOSIS — T148XXA Other injury of unspecified body region, initial encounter: Secondary | ICD-10-CM | POA: Diagnosis not present

## 2023-05-26 DIAGNOSIS — K219 Gastro-esophageal reflux disease without esophagitis: Secondary | ICD-10-CM

## 2023-05-26 DIAGNOSIS — I10 Essential (primary) hypertension: Secondary | ICD-10-CM

## 2023-05-26 DIAGNOSIS — G301 Alzheimer's disease with late onset: Secondary | ICD-10-CM

## 2023-05-26 DIAGNOSIS — F458 Other somatoform disorders: Secondary | ICD-10-CM

## 2023-05-26 DIAGNOSIS — F339 Major depressive disorder, recurrent, unspecified: Secondary | ICD-10-CM

## 2023-05-26 DIAGNOSIS — J4540 Moderate persistent asthma, uncomplicated: Secondary | ICD-10-CM

## 2023-05-26 NOTE — Progress Notes (Signed)
Location:  [facility] Room Number: 121/A Place of Service:  SNF [phone]) Provider:  Octavia Heir, NP   Mahlon Gammon, MD  Patient Care Team: Mahlon Gammon, MD as PCP - General (Internal Medicine) Storm Frisk, MD (Pulmonary Disease)  Extended Emergency Contact Information Primary Emergency Contact: Mel Almond of [place] Home Phone: [phone] Mobile Phone: [phone] Relation: Daughter  Code Status:  DNR Goals of care: Advanced Directive information    04/28/2023    3:13 PM  Advanced Directives  Does Patient Have a Medical Advance Directive? Yes  Type of Estate agent of [place];Living will;Out of facility DNR (pink MOST or yellow form)  Does patient want to make changes to medical advance directive? No - Patient declined  Copy of Healthcare Power of Attorney in Chart? Yes - validated most recent copy scanned in chart (See row information)     Chief Complaint  Patient presents with   Medical Management of Chronic Issues    HPI:  Pt is a 89 y.o. female seen today for medical management of chronic diseases.    She currently resides on the skilled nursing unit at [facility]. PMH: HTN, allergic rhinitis, asthma, GERD, Alzheimer's, OA, osteoporosis, CKD 3b, and depression.  Suspicious lesion- 10/16 biopsy to left forearm performed, daily dressing changes with saline/Aquaphor/Band-Aid, no sign of infection at this time   Bilateral skin tear- involves right shin/and calf and LLE, remains on daily dressing changes with polymem HTN- BUN/creat 21/0.94 03/17/2023, remains in lasix daily  CKD- see above, GFR 55 03/17/2023 Alzheimer's with delusions- CT head 2018 chronic ischemic microvascular disease, behaviors with ADLs, non ambulatory, dependent with ADLs, sleeping more, remains on Namenda and vistaril prn Persistent Asthma- followed by Dr. Judeth Horn seen 06/11> f/u in 1 year, remains on  Dupixent, nebulized ICS/LABA and duonebs, Singulair and Flonase Bruxism- ongoing, remains on Seroquel  GERD- hgb 11.1 08/20/2022, remains on Prilosec Depression/Anxiety- no recent mood changes, Na+ 139 03/17/2023, remains on Prozac> recently increased  Recent blood pressures:  10/22- 133/71  10/20- 122/78  10/19- 150/69  Recent weights:  10/01- 161.4 lbs  09/01- 161.4 lbs  08/01- 161.4 lbs   Past Medical History:  Diagnosis Date   Age-related osteoporosis without current pathological fracture    Alzheimer's disease (HCC)    with late onset   Arthritis    Asthma    Deficiency of other specified B group vitamins    Depression    Environmental allergies    mold   Fall    GERD without esophagitis    Hyperlipidemia    Hypertension    Hypertensive kidney disease with CKD (chronic kidney disease)    with stage I through stage 4 CKD    Memory loss    Seasonal allergies    Unspecified osteoarthritis, unspecified site    Past Surgical History:  Procedure Laterality Date   HIP ARTHROPLASTY  06/25/2011   Procedure: ARTHROPLASTY BIPOLAR HIP;  Surgeon: Illene Labrador [place];  Location: [facility];  Service: Orthopedics;  Laterality: Right;   NASAL SINUS SURGERY     VAGINAL HYSTERECTOMY      Allergies  Allergen Reactions   Azithromycin Other (See Comments)    Nausea, weakness    Aspirin Other (See Comments)    Unknown reaction   Erythromycin    Molds & Smuts    Other     Seasonal Allergies.   Septra [Sulfamethoxazole-Trimethoprim]    Sulfa Antibiotics    Sulfonamide  Derivatives Other (See Comments)    Unknown reaction    Outpatient Encounter Medications as of 05/26/2023  Medication Sig   acetaminophen (TYLENOL) 500 MG tablet Take 500 mg by mouth 3 (three) times daily.   benzonatate (TESSALON) 200 MG capsule Take 1 capsule (200 mg total) by mouth 3 (three) times daily as needed for cough.   budesonide (PULMICORT) 0.5 MG/2ML nebulizer solution Take 0.5 mg by nebulization 2  (two) times daily.   Calcium Carb-Cholecalciferol (CALCIUM 600-D PO) Take 1 tablet by mouth daily.   Cholecalciferol (VITAMIN D) 2000 units tablet Take 2,000 Units by mouth at bedtime.   dextromethorphan (DELSYM) 30 MG/5ML liquid Take 60 mg by mouth every 12 (twelve) hours as needed for cough.   dupilumab (DUPIXENT) 300 MG/2ML prefilled syringe Inject 300 mg into the skin every 14 (fourteen) days.   FLUoxetine (PROZAC) 40 MG capsule Take 40 mg by mouth daily.   fluticasone (FLONASE) 50 MCG/ACT nasal spray Place 2 sprays into both nostrils daily. 8 am - 11 am   formoterol (PERFOROMIST) 20 MCG/2ML nebulizer solution Take 20 mcg by nebulization 2 (two) times daily.   furosemide (LASIX) 20 MG tablet Take 20 mg by mouth daily. Hold for SBP<120   hydrOXYzine (ATARAX) 25 MG tablet Take 25 mg by mouth 3 (three) times daily as needed for anxiety.   ipratropium-albuterol (DUONEB) 0.5-2.5 (3) MG/3ML SOLN Take 3 mLs by nebulization every 6 (six) hours as needed.   memantine (NAMENDA) 10 MG tablet Take 1 tablet (10 mg total) by mouth 2 (two) times daily.   montelukast (SINGULAIR) 10 MG tablet Take 1 tablet (10 mg total) by mouth daily.   nystatin (MYCOSTATIN/NYSTOP) powder Apply 1 Application topically every morning.   omeprazole (PRILOSEC) 20 MG capsule Take 20 mg by mouth every morning. 30 minutes before breakfast.   omeprazole (PRILOSEC) 20 MG capsule Take 20 mg by mouth every evening. 30 minutes before dinner.   Polyethyl Glycol-Propyl Glycol (SYSTANE) 0.4-0.3 % GEL ophthalmic gel Place 1 application into both eyes at bedtime.   polyethylene glycol (MIRALAX / GLYCOLAX) packet Take 17 g by mouth every 3 (three) days.   QUEtiapine (SEROQUEL) 25 MG tablet Take 25 mg by mouth at bedtime.   vitamin B-12 (CYANOCOBALAMIN) 1000 MCG tablet Take 3,000 mcg by mouth daily.   [DISCONTINUED] rivaroxaban (XARELTO) 10 MG TABS tablet Take 1 tablet (10 mg total) by mouth daily.   No facility-administered encounter  medications on file as of 05/26/2023.    Review of Systems  Unable to perform ROS: Dementia    Immunization History  Administered Date(s) Administered   Influenza Inj Mdck Quad Pf 05/18/2019, 05/07/2021   Influenza Split 04/04/2012, 05/03/2013, 05/10/2015   Influenza Whole 04/18/2008, 06/04/2009, 04/03/2010, 03/30/2011   Influenza, High Dose Seasonal PF 04/20/2018, 05/24/2020   Influenza,inj,Quad PF,6+ Mos 03/26/2017   Influenza-Unspecified 03/26/2010, 04/13/2012, 05/08/2013, 05/14/2014, 06/03/2014, 05/13/2015, 04/27/2016, 05/08/2022   Moderna Covid-19 Fall Seasonal Vaccine 78yrs & older 06/08/2022   Moderna Covid-19 Vaccine Bivalent Booster 41yrs & up 11/28/2021   Moderna SARS-COV2 Booster Vaccination 06/13/2020, 03/25/2021, 05/14/2021   Moderna Sars-Covid-2 Vaccination 09/05/2019, 10/03/2019   Pfizer Covid-19 Vaccine Bivalent Booster 30yrs & up 11/26/2022   Pneumococcal Conjugate-13 03/05/2014   Pneumococcal Polysaccharide-23 06/04/2009   Pneumococcal-Unspecified 03/13/2009   Respiratory Syncytial Virus Vaccine,Recomb Aduvanted(Arexvy) 07/23/2022   Td 03/13/2009, 04/13/2012   Tdap 12/01/2022   Zoster Recombinant(Shingrix) 08/03/2017, 10/01/2017   Zoster, Live 03/21/2010   Pertinent  Health Maintenance Due  Topic Date Due  INFLUENZA VACCINE  03/04/2023   DEXA SCAN  Discontinued      01/11/2020    9:49 AM 09/26/2021    1:02 PM 08/19/2022    2:35 PM 11/27/2022   10:29 AM 02/16/2023    2:48 PM  Fall Risk  Falls in the past year? 0 0 0 0 0  Was there an injury with Fall? 0 0 0 0 0  Fall Risk Category Calculator 0 0 0 0 0  Fall Risk Category (Retired) Low Low     (RETIRED) Patient Fall Risk Level Low fall risk Low fall risk     Patient at Risk for Falls Due to  Impaired balance/gait No Fall Risks History of fall(s) History of fall(s);Impaired balance/gait;Impaired mobility  Fall risk Follow up  Falls evaluation completed Falls evaluation completed Falls evaluation completed  Falls evaluation completed;Education provided;Falls prevention discussed   Functional Status Survey:    Vitals:   05/26/23 1413  BP: 133/71  Pulse: 68  Resp: 16  Temp: 98.3 F (36.8 C)  SpO2: 98%  Weight: 161 lb 6.4 oz (73.2 kg)  Height: 5\' 2"  (1.575 m)   Body mass index is 29.52 kg/m. Physical Exam Vitals reviewed.  Constitutional:      General: She is not in acute distress. HENT:     Head: Normocephalic.     Right Ear: There is no impacted cerumen.     Left Ear: There is no impacted cerumen.     Nose: Nose normal.     Mouth/Throat:     Mouth: Mucous membranes are moist.  Eyes:     General:        Right eye: No discharge.        Left eye: No discharge.  Cardiovascular:     Rate and Rhythm: Normal rate and regular rhythm.     Pulses: Normal pulses.     Heart sounds: Normal heart sounds.  Pulmonary:     Effort: Pulmonary effort is normal. No respiratory distress.     Breath sounds: Normal breath sounds. No wheezing.  Abdominal:     General: Bowel sounds are normal.     Palpations: Abdomen is soft.  Musculoskeletal:     Cervical back: Neck supple.     Right lower leg: No edema.     Left lower leg: No edema.  Skin:    General: Skin is warm.     Capillary Refill: Capillary refill takes less than 2 seconds.     Findings: Lesion present.     Comments: Skin tears to right shin and calf CDI, no sign of infection, flaking skin present. Left lower leg skin tear CDI, almost healed. Left forearm biopsy site CDI.   Neurological:     General: No focal deficit present.     Mental Status: She is alert. Mental status is at baseline.     Motor: Weakness present.     Gait: Gait abnormal.  Psychiatric:        Mood and Affect: Mood normal.     Labs reviewed: Recent Labs    08/20/22 0000 03/15/23 0000  NA 137 139  K 4.3 4.5  CL 105 103  CO2 21 22  BUN 28* 21  CREATININE 0.9 0.9  CALCIUM 9.5 9.3   Recent Labs    08/20/22 0000 03/15/23 0000  AST 17 17  ALT 12  12  ALKPHOS 100 113  ALBUMIN 3.3* 3.6   Recent Labs    08/20/22 0000  WBC 5.5  HGB 11.1*  HCT 33*  PLT 186   Lab Results  Component Value Date   TSH 1.52 09/23/2020   No results found for: "HGBA1C" No results found for: "CHOL", "HDL", "LDLCALC", "LDLDIRECT", "TRIG", "CHOLHDL"  Significant Diagnostic Results in last 30 days:  No results found.  Assessment/Plan 1. Pigmented skin lesion suspicious for malignant neoplasm - s/p biopsy left forearm 10/16 - no sign of infection - cont daily care with saline, Aquaphor and cover with Band-Aid  2. Multiple skin tears - noted to right shin/calf> no sign of infection - noted to left lower leg> almost healed - cont polymem dressing   3. Essential hypertension - controlled with furosemide   4. Stage 3a chronic kidney disease (HCC) - encourage hydration with water - avoid NSAIDS  5. Dementia of the Alzheimer's type, with late onset, with delusions (HCC) - behaviors with ADLs - dependent with ADLs  - nonambulatory - cont Namenda and Vistaril prn  6. Moderate persistent asthma without complication - no recent exacerbations - cont remains on Dupixent, nebulized ICS/LABA and duonebs, Singulair and Flonase  7. Bruxism - improved with Seroquel  8. Gastroesophageal reflux disease without esophagitis - hgb stable - cont omeprazole  9. Anxiety - associated with dementia/delusions - cont Prozac and Vistaril prn  10. Recurrent depression (HCC) - cont Prozac    Family/ staff Communication: plan discussed with patient and nurse  Labs/tests ordered:  none

## 2023-06-25 ENCOUNTER — Non-Acute Institutional Stay (SKILLED_NURSING_FACILITY): Payer: Medicare Other | Admitting: Adult Health

## 2023-06-25 DIAGNOSIS — J4550 Severe persistent asthma, uncomplicated: Secondary | ICD-10-CM | POA: Diagnosis not present

## 2023-06-25 DIAGNOSIS — G301 Alzheimer's disease with late onset: Secondary | ICD-10-CM | POA: Diagnosis not present

## 2023-06-25 DIAGNOSIS — I10 Essential (primary) hypertension: Secondary | ICD-10-CM

## 2023-06-25 DIAGNOSIS — K219 Gastro-esophageal reflux disease without esophagitis: Secondary | ICD-10-CM | POA: Diagnosis not present

## 2023-06-25 DIAGNOSIS — F02818 Dementia in other diseases classified elsewhere, unspecified severity, with other behavioral disturbance: Secondary | ICD-10-CM

## 2023-06-25 DIAGNOSIS — N1832 Chronic kidney disease, stage 3b: Secondary | ICD-10-CM

## 2023-06-25 DIAGNOSIS — F325 Major depressive disorder, single episode, in full remission: Secondary | ICD-10-CM

## 2023-06-27 ENCOUNTER — Encounter: Payer: Self-pay | Admitting: Adult Health

## 2023-06-27 NOTE — Progress Notes (Signed)
Location:  Medical illustrator of Service:  SNF (31) Provider:   Peggye Ley, ANP [facility] [phone]   Mahlon Gammon, MD  Patient Care Team: Mahlon Gammon, MD as PCP - General (Internal Medicine) Storm Frisk, MD (Pulmonary Disease)  Extended Emergency Contact Information Primary Emergency Contact: Mel Almond of [place] Home Phone: [phone] Mobile Phone: [phone] Relation: Daughter  Code Status:  DNR Goals of care: Advanced Directive information    04/28/2023    3:13 PM  Advanced Directives  Does Patient Have a Medical Advance Directive? Yes  Type of Estate agent of [place];Living will;Out of facility DNR (pink MOST or yellow form)  Does patient want to make changes to medical advance directive? No - Patient declined  Copy of Healthcare Power of Attorney in Chart? Yes - validated most recent copy scanned in chart (See row information)     Chief Complaint  Patient presents with   Medical Management of Chronic Issues    HPI:  Pt is a 89 y.o. female seen today for medical management of chronic diseases.    PMH significant for asthma, OP, arthritis, Vit d deficiency, depression, dementia, HTN, HLD, CKD hyperlipidemia, seasonal allergies, OA, and GERD.   No new complaints per nursing staff  Dementia: needs assistance with all ADLs, hoyer lift for transfers, incontinence. Uses prn vistaril when agitated in the afternoon. Remains pleasant and verbal. Sleeps in and can be hard to arouse in the am. MMSE 7/30  She has a SCC of the left forearm diagnosed by biopsy on 06/23/23.  Dermatology is following up with the POA regarding treatment options.    Asthma: no new exacerbations. Followed by pulmonary has been doing well on Dupixent and pulmicort  HTN Controlled  GERD no issues on prilosec  Depression: on prozac for years. No verbalization or sign of depression. Becomes  anxious in the afternoon evening hours.    Past Medical History:  Diagnosis Date   Age-related osteoporosis without current pathological fracture    Alzheimer's disease (HCC)    with late onset   Arthritis    Asthma    Deficiency of other specified B group vitamins    Depression    Environmental allergies    mold   Fall    GERD without esophagitis    Hyperlipidemia    Hypertension    Hypertensive kidney disease with CKD (chronic kidney disease)    with stage I through stage 4 CKD    Memory loss    Seasonal allergies    Unspecified osteoarthritis, unspecified site    Past Surgical History:  Procedure Laterality Date   HIP ARTHROPLASTY  06/25/2011   Procedure: ARTHROPLASTY BIPOLAR HIP;  Surgeon: Illene Labrador [place];  Location: [facility];  Service: Orthopedics;  Laterality: Right;   NASAL SINUS SURGERY     VAGINAL HYSTERECTOMY      Allergies  Allergen Reactions   Azithromycin Other (See Comments)    Nausea, weakness    Aspirin Other (See Comments)    Unknown reaction   Erythromycin    Molds & Smuts    Other     Seasonal Allergies.   Septra [Sulfamethoxazole-Trimethoprim]    Sulfa Antibiotics    Sulfonamide Derivatives Other (See Comments)    Unknown reaction    Outpatient Encounter Medications as of 06/25/2023  Medication Sig   acetaminophen (TYLENOL) 500 MG tablet Take 500 mg by mouth 3 (three) times daily.   benzonatate (  TESSALON) 200 MG capsule Take 1 capsule (200 mg total) by mouth 3 (three) times daily as needed for cough.   budesonide (PULMICORT) 0.5 MG/2ML nebulizer solution Take 0.5 mg by nebulization 2 (two) times daily.   Calcium Carb-Cholecalciferol (CALCIUM 600-D PO) Take 1 tablet by mouth daily.   Cholecalciferol (VITAMIN D) 2000 units tablet Take 2,000 Units by mouth at bedtime.   dextromethorphan (DELSYM) 30 MG/5ML liquid Take 60 mg by mouth every 12 (twelve) hours as needed for cough.   dupilumab (DUPIXENT) 300 MG/2ML prefilled syringe Inject 300 mg  into the skin every 14 (fourteen) days.   FLUoxetine (PROZAC) 40 MG capsule Take 40 mg by mouth daily.   fluticasone (FLONASE) 50 MCG/ACT nasal spray Place 2 sprays into both nostrils daily. 8 am - 11 am   formoterol (PERFOROMIST) 20 MCG/2ML nebulizer solution Take 20 mcg by nebulization 2 (two) times daily.   furosemide (LASIX) 20 MG tablet Take 20 mg by mouth daily. Hold for SBP<120   hydrOXYzine (ATARAX) 25 MG tablet Take 25 mg by mouth 3 (three) times daily as needed for anxiety.   ipratropium-albuterol (DUONEB) 0.5-2.5 (3) MG/3ML SOLN Take 3 mLs by nebulization every 6 (six) hours as needed.   memantine (NAMENDA) 10 MG tablet Take 1 tablet (10 mg total) by mouth 2 (two) times daily.   montelukast (SINGULAIR) 10 MG tablet Take 1 tablet (10 mg total) by mouth daily.   nystatin (MYCOSTATIN/NYSTOP) powder Apply 1 Application topically every morning.   omeprazole (PRILOSEC) 20 MG capsule Take 20 mg by mouth every morning. 30 minutes before breakfast.   omeprazole (PRILOSEC) 20 MG capsule Take 20 mg by mouth every evening. 30 minutes before dinner.   Polyethyl Glycol-Propyl Glycol (SYSTANE) 0.4-0.3 % GEL ophthalmic gel Place 1 application into both eyes at bedtime.   polyethylene glycol (MIRALAX / GLYCOLAX) packet Take 17 g by mouth every 3 (three) days.   QUEtiapine (SEROQUEL) 25 MG tablet Take 25 mg by mouth at bedtime.   vitamin B-12 (CYANOCOBALAMIN) 1000 MCG tablet Take 3,000 mcg by mouth daily.   No facility-administered encounter medications on file as of 06/25/2023.    Review of Systems  Unable to perform ROS: Dementia    Immunization History  Administered Date(s) Administered   Influenza Inj Mdck Quad Pf 05/18/2019, 05/07/2021   Influenza Split 04/04/2012, 05/03/2013, 05/10/2015   Influenza Whole 04/18/2008, 06/04/2009, 04/03/2010, 03/30/2011   Influenza, High Dose Seasonal PF 04/20/2018, 05/24/2020   Influenza,inj,Quad PF,6+ Mos 03/26/2017   Influenza-Unspecified 03/26/2010,  04/13/2012, 05/08/2013, 05/14/2014, 06/03/2014, 05/13/2015, 04/27/2016, 05/08/2022   Moderna Covid-19 Fall Seasonal Vaccine 7yrs & older 06/08/2022   Moderna Covid-19 Vaccine Bivalent Booster 51yrs & up 11/28/2021   Moderna SARS-COV2 Booster Vaccination 06/13/2020, 03/25/2021, 05/14/2021   Moderna Sars-Covid-2 Vaccination 09/05/2019, 10/03/2019   Pfizer Covid-19 Vaccine Bivalent Booster 26yrs & up 11/26/2022   Pneumococcal Conjugate-13 03/05/2014   Pneumococcal Polysaccharide-23 06/04/2009   Pneumococcal-Unspecified 03/13/2009   Respiratory Syncytial Virus Vaccine,Recomb Aduvanted(Arexvy) 07/23/2022   Td 03/13/2009, 04/13/2012   Tdap 12/01/2022   Zoster Recombinant(Shingrix) 08/03/2017, 10/01/2017   Zoster, Live 03/21/2010   Pertinent  Health Maintenance Due  Topic Date Due   INFLUENZA VACCINE  03/04/2023   DEXA SCAN  Discontinued      09/26/2021    1:02 PM 08/19/2022    2:35 PM 11/27/2022   10:29 AM 02/16/2023    2:48 PM 05/26/2023    8:27 PM  Fall Risk  Falls in the past year? 0 0 0 0 0  Was there an injury with Fall? 0 0 0 0 0  Fall Risk Category Calculator 0 0 0 0 0  Fall Risk Category (Retired) Low      (RETIRED) Patient Fall Risk Level Low fall risk      Patient at Risk for Falls Due to Impaired balance/gait No Fall Risks History of fall(s) History of fall(s);Impaired balance/gait;Impaired mobility Impaired mobility;Impaired balance/gait;History of fall(s)  Fall risk Follow up Falls evaluation completed Falls evaluation completed Falls evaluation completed Falls evaluation completed;Education provided;Falls prevention discussed Falls evaluation completed;Education provided;Falls prevention discussed   Functional Status Survey:    Vitals:   06/27/23 0812  BP: (!) 144/60  Pulse: 60  Resp: 18  Temp: (!) 96.6 F (35.9 C)  SpO2: 99%  Weight: 159 lb (72.1 kg)   Body mass index is 29.08 kg/m. Physical Exam Vitals and nursing note reviewed.  Constitutional:       General: She is not in acute distress.    Appearance: She is not diaphoretic.  HENT:     Head: Normocephalic and atraumatic.  Neck:     Vascular: No JVD.  Cardiovascular:     Rate and Rhythm: Normal rate and regular rhythm.     Heart sounds: No murmur heard. Pulmonary:     Effort: Pulmonary effort is normal. No respiratory distress.     Breath sounds: Normal breath sounds. No wheezing.  Abdominal:     General: Bowel sounds are normal. There is no distension.     Palpations: Abdomen is soft.     Tenderness: There is no abdominal tenderness.  Musculoskeletal:     Comments: No edema  Skin:    General: Skin is warm and dry.  Neurological:     General: No focal deficit present.     Mental Status: She is alert. Mental status is at baseline.  Psychiatric:        Mood and Affect: Mood normal.     Labs reviewed: Recent Labs    08/20/22 0000 03/15/23 0000  NA 137 139  K 4.3 4.5  CL 105 103  CO2 21 22  BUN 28* 21  CREATININE 0.9 0.9  CALCIUM 9.5 9.3   Recent Labs    08/20/22 0000 03/15/23 0000  AST 17 17  ALT 12 12  ALKPHOS 100 113  ALBUMIN 3.3* 3.6   Recent Labs    08/20/22 0000  WBC 5.5  HGB 11.1*  HCT 33*  PLT 186   Lab Results  Component Value Date   TSH 1.52 09/23/2020   No results found for: "HGBA1C" No results found for: "CHOL", "HDL", "LDLCALC", "LDLDIRECT", "TRIG", "CHOLHDL"  Significant Diagnostic Results in last 30 days:  No results found.  Assessment/Plan 1. Essential hypertension Controlled Continue lasix   2. Severe persistent asthma without complication Controlled  Continue Dupixent, Pulmicort, and Performorist per pulmonary   3. Gastro-esophageal reflux disease without esophagitis Continue Prilosec   4. Dementia of the Alzheimer's type, with late onset, with delusions (HCC) Progressive decline in cognition and physical function c/w the disease. Continue supportive care in the skilled environment. Continue Namenda, Vistaril and  Seroquel  5. Stage 3b chronic kidney disease (HCC) BUN 29 Cr 0.9 Continue to periodically monitor BMP and avoid nephrotoxic agents  6. Major depressive disorder, single episode, in remission (HCC) Long term use of prozac Has periods of agitation, would not taper    Dermatology will f/u regarding treatment of SCC (risk benefit given her age/dementia)   Family/ staff Communication: nurse  Labs/tests ordered:  NA

## 2023-07-26 ENCOUNTER — Non-Acute Institutional Stay (SKILLED_NURSING_FACILITY): Payer: Self-pay | Admitting: Internal Medicine

## 2023-07-26 ENCOUNTER — Encounter: Payer: Self-pay | Admitting: Internal Medicine

## 2023-07-26 DIAGNOSIS — F325 Major depressive disorder, single episode, in full remission: Secondary | ICD-10-CM

## 2023-07-26 DIAGNOSIS — G301 Alzheimer's disease with late onset: Secondary | ICD-10-CM

## 2023-07-26 DIAGNOSIS — J4550 Severe persistent asthma, uncomplicated: Secondary | ICD-10-CM | POA: Diagnosis not present

## 2023-07-26 DIAGNOSIS — F02818 Dementia in other diseases classified elsewhere, unspecified severity, with other behavioral disturbance: Secondary | ICD-10-CM

## 2023-07-26 DIAGNOSIS — I10 Essential (primary) hypertension: Secondary | ICD-10-CM | POA: Diagnosis not present

## 2023-07-26 DIAGNOSIS — N1832 Chronic kidney disease, stage 3b: Secondary | ICD-10-CM

## 2023-07-26 DIAGNOSIS — K219 Gastro-esophageal reflux disease without esophagitis: Secondary | ICD-10-CM

## 2023-07-26 NOTE — Progress Notes (Unsigned)
[facility] Room Number: 121A Place of Service:  SNF [id number]) Provider:  Mahlon Gammon, MD   Mahlon Gammon, MD  Patient Care Team: Mahlon Gammon, MD as PCP - General (Internal Medicine) Storm Frisk, MD (Pulmonary Disease)  Extended Emergency Contact Information Primary Emergency Contact: Mel Almond of [place] Home Phone: [phone] Mobile Phone: [phone] Relation: Daughter  Code Status:  DNR  Goals of care: Advanced Directive information    07/26/2023    4:55 PM  Advanced Directives  Does Patient Have a Medical Advance Directive? Yes  Type of Estate agent of [place];Living will;Out of facility DNR (pink MOST or yellow form)  Does patient want to make changes to medical advance directive? No - Patient declined  Copy of Healthcare Power of Attorney in Chart? Yes - validated most recent copy scanned in chart (See row information)     Chief Complaint  Patient presents with   Medical Management of Chronic Issues    Patient is being seen for a routine visit     HPI:  Pt is a 89 y.o. female seen today for medical management of chronic diseases.    Lives in SNF in [place]   Patient has a history of Alzheimer's dementia dependent for her ADLs Recent Decline with behaviors Vistaril seems to be helping with behaviors. She is now Comfort Care Osteoporosis and knee osteoarthritis CKD stage 3b Severe asthma Eosinophilic Sees Pulmonary stable in Dupixent HLD and hypertension   No Other Nursing Issues She is hoyer Dependent Helps with Feeding Her weight staying stable Wt Readings from Last 3 Encounters:  07/26/23 161 lb 3.2 oz (73.1 kg)  06/27/23 159 lb (72.1 kg)  05/26/23 161 lb 6.4 oz (73.2 kg)     Past Medical History:  Diagnosis Date   Age-related osteoporosis without current pathological fracture    Alzheimer's disease (HCC)    with late onset   Arthritis     Asthma    Deficiency of other specified B group vitamins    Depression    Environmental allergies    mold   Fall    GERD without esophagitis    Hyperlipidemia    Hypertension    Hypertensive kidney disease with CKD (chronic kidney disease)    with stage I through stage 4 CKD    Memory loss    Seasonal allergies    Unspecified osteoarthritis, unspecified site    Past Surgical History:  Procedure Laterality Date   HIP ARTHROPLASTY  06/25/2011   Procedure: ARTHROPLASTY BIPOLAR HIP;  Surgeon: Illene Labrador [place];  Location: [facility];  Service: Orthopedics;  Laterality: Right;   NASAL SINUS SURGERY     VAGINAL HYSTERECTOMY      Allergies  Allergen Reactions   Azithromycin Other (See Comments)    Nausea, weakness    Aspirin Other (See Comments)    Unknown reaction   Erythromycin    Molds & Smuts    Other     Seasonal Allergies.   Septra [Sulfamethoxazole-Trimethoprim]    Sulfa Antibiotics    Sulfonamide Derivatives Other (See Comments)    Unknown reaction    Outpatient Encounter Medications as of 07/26/2023  Medication Sig   acetaminophen (TYLENOL) 500 MG tablet Take 500 mg by mouth 3 (three) times daily.   benzonatate (TESSALON) 200 MG capsule Take 1 capsule (200 mg total) by mouth 3 (three) times daily as needed for cough.   budesonide (PULMICORT) 0.5 MG/2ML nebulizer  solution Take 0.5 mg by nebulization 2 (two) times daily.   Calcium Carb-Cholecalciferol (CALCIUM 600-D PO) Take 1 tablet by mouth daily.   Cholecalciferol (VITAMIN D) 2000 units tablet Take 2,000 Units by mouth at bedtime.   dextromethorphan (DELSYM) 30 MG/5ML liquid Take 60 mg by mouth every 12 (twelve) hours as needed for cough.   dupilumab (DUPIXENT) 300 MG/2ML prefilled syringe Inject 300 mg into the skin every 14 (fourteen) days.   FLUoxetine (PROZAC) 40 MG capsule Take 40 mg by mouth daily.   fluticasone (FLONASE) 50 MCG/ACT nasal spray Place 2 sprays into both nostrils daily. 8 am - 11 am   formoterol  (PERFOROMIST) 20 MCG/2ML nebulizer solution Take 20 mcg by nebulization 2 (two) times daily.   furosemide (LASIX) 20 MG tablet Take 20 mg by mouth daily. Hold for SBP<120   hydrOXYzine (ATARAX) 25 MG tablet Take 25 mg by mouth 3 (three) times daily as needed for anxiety.   ipratropium-albuterol (DUONEB) 0.5-2.5 (3) MG/3ML SOLN Take 3 mLs by nebulization every 6 (six) hours as needed.   memantine (NAMENDA) 10 MG tablet Take 1 tablet (10 mg total) by mouth 2 (two) times daily.   montelukast (SINGULAIR) 10 MG tablet Take 1 tablet (10 mg total) by mouth daily.   nystatin (MYCOSTATIN/NYSTOP) powder Apply 1 Application topically every morning.   omeprazole (PRILOSEC) 20 MG capsule Take 20 mg by mouth every morning. 30 minutes before breakfast.   omeprazole (PRILOSEC) 20 MG capsule Take 20 mg by mouth every evening. 30 minutes before dinner.   Polyethyl Glycol-Propyl Glycol (SYSTANE) 0.4-0.3 % GEL ophthalmic gel Place 1 application into both eyes at bedtime.   polyethylene glycol (MIRALAX / GLYCOLAX) packet Take 17 g by mouth every 3 (three) days.   QUEtiapine (SEROQUEL) 25 MG tablet Take 25 mg by mouth at bedtime.   vitamin B-12 (CYANOCOBALAMIN) 1000 MCG tablet Take 3,000 mcg by mouth daily.   [DISCONTINUED] rivaroxaban (XARELTO) 10 MG TABS tablet Take 1 tablet (10 mg total) by mouth daily.   No facility-administered encounter medications on file as of 07/26/2023.    Review of Systems  Unable to perform ROS: Dementia    Immunization History  Administered Date(s) Administered   Fluad Trivalent(High Dose 65+) 05/25/2023   Influenza Inj Mdck Quad Pf 05/18/2019, 05/07/2021   Influenza Split 04/04/2012, 05/03/2013, 05/10/2015   Influenza Whole 04/18/2008, 06/04/2009, 04/03/2010, 03/30/2011   Influenza, High Dose Seasonal PF 04/20/2018, 05/24/2020   Influenza,inj,Quad PF,6+ Mos 03/26/2017   Influenza-Unspecified 03/26/2010, 04/13/2012, 05/08/2013, 05/14/2014, 06/03/2014, 05/13/2015, 04/27/2016,  05/08/2022   Moderna Covid-19 Fall Seasonal Vaccine 74yrs & older 06/08/2022   Moderna Covid-19 Vaccine Bivalent Booster 41yrs & up 11/28/2021, 05/25/2023   Moderna SARS-COV2 Booster Vaccination 06/13/2020, 03/25/2021, 05/14/2021   Moderna Sars-Covid-2 Vaccination 09/05/2019, 10/03/2019   Pfizer Covid-19 Vaccine Bivalent Booster 77yrs & up 11/26/2022   Pneumococcal Conjugate-13 03/05/2014   Pneumococcal Polysaccharide-23 06/04/2009   Pneumococcal-Unspecified 03/13/2009   Respiratory Syncytial Virus Vaccine,Recomb Aduvanted(Arexvy) 07/23/2022   Td 03/13/2009, 04/13/2012   Tdap 12/01/2022   Zoster Recombinant(Shingrix) 08/03/2017, 10/01/2017   Zoster, Live 03/21/2010   Pertinent  Health Maintenance Due  Topic Date Due   INFLUENZA VACCINE  Completed   DEXA SCAN  Discontinued      09/26/2021    1:02 PM 08/19/2022    2:35 PM 11/27/2022   10:29 AM 02/16/2023    2:48 PM 05/26/2023    8:27 PM  Fall Risk  Falls in the past year? 0 0 0 0 0  Was there an injury with Fall? 0 0 0 0 0  Fall Risk Category Calculator 0 0 0 0 0  Fall Risk Category (Retired) Low      (RETIRED) Patient Fall Risk Level Low fall risk      Patient at Risk for Falls Due to Impaired balance/gait No Fall Risks History of fall(s) History of fall(s);Impaired balance/gait;Impaired mobility Impaired mobility;Impaired balance/gait;History of fall(s)  Fall risk Follow up Falls evaluation completed Falls evaluation completed Falls evaluation completed Falls evaluation completed;Education provided;Falls prevention discussed Falls evaluation completed;Education provided;Falls prevention discussed   Functional Status Survey:    Vitals:   07/26/23 1653  BP: 109/62  Pulse: 60  Resp: 16  Temp: 98.4 F (36.9 C)  TempSrc: Temporal  SpO2: 99%  Weight: 161 lb 3.2 oz (73.1 kg)  Height: 5\' 2"  (1.575 m)   Body mass index is 29.48 kg/m. Physical Exam Vitals reviewed.  Constitutional:      Appearance: Normal appearance.  HENT:      Head: Normocephalic.     Nose: Nose normal.     Mouth/Throat:     Mouth: Mucous membranes are moist.     Pharynx: Oropharynx is clear.  Eyes:     Pupils: Pupils are equal, round, and reactive to light.  Cardiovascular:     Rate and Rhythm: Normal rate and regular rhythm.     Pulses: Normal pulses.     Heart sounds: Normal heart sounds. No murmur heard. Pulmonary:     Effort: Pulmonary effort is normal.     Breath sounds: Normal breath sounds.  Abdominal:     General: Abdomen is flat. Bowel sounds are normal.     Palpations: Abdomen is soft.  Musculoskeletal:        General: No swelling.     Cervical back: Neck supple.  Skin:    General: Skin is warm.  Neurological:     General: No focal deficit present.     Mental Status: She is alert.  Psychiatric:        Mood and Affect: Mood normal.        Thought Content: Thought content normal.     Labs reviewed: Recent Labs    08/20/22 0000 03/15/23 0000  NA 137 139  K 4.3 4.5  CL 105 103  CO2 21 22  BUN 28* 21  CREATININE 0.9 0.9  CALCIUM 9.5 9.3   Recent Labs    08/20/22 0000 03/15/23 0000  AST 17 17  ALT 12 12  ALKPHOS 100 113  ALBUMIN 3.3* 3.6   Recent Labs    08/20/22 0000  WBC 5.5  HGB 11.1*  HCT 33*  PLT 186   Lab Results  Component Value Date   TSH 1.52 09/23/2020   No results found for: "HGBA1C" No results found for: "CHOL", "HDL", "LDLCALC", "LDLDIRECT", "TRIG", "CHOLHDL"  Significant Diagnostic Results in last 30 days:  No results found.  Assessment/Plan 1. Essential hypertension (Primary) Only Lasix only  Doing well  2. Severe persistent asthma without complication Performist and Pulmicort and Dupixent Singulair  3. Gastro-esophageal reflux disease without esophagitis Prilosec  4. Dementia of the Alzheimer's type, with late onset, with delusions (HCC) Namenda Seroquel and Vistaril Full ADL care  5. Stage 3b chronic kidney disease (HCC) Creat  6. Major depressive disorder,  single episode, in remission [facility]) Prozac    Family/ staff Communication:   Labs/tests ordered:

## 2023-08-17 ENCOUNTER — Non-Acute Institutional Stay (SKILLED_NURSING_FACILITY): Payer: Self-pay | Admitting: Orthopedic Surgery

## 2023-08-17 ENCOUNTER — Encounter: Payer: Self-pay | Admitting: Orthopedic Surgery

## 2023-08-17 DIAGNOSIS — R635 Abnormal weight gain: Secondary | ICD-10-CM | POA: Diagnosis not present

## 2023-08-17 DIAGNOSIS — I10 Essential (primary) hypertension: Secondary | ICD-10-CM

## 2023-08-17 DIAGNOSIS — C44609 Unspecified malignant neoplasm of skin of left upper limb, including shoulder: Secondary | ICD-10-CM | POA: Diagnosis not present

## 2023-08-17 DIAGNOSIS — N1831 Chronic kidney disease, stage 3a: Secondary | ICD-10-CM

## 2023-08-17 DIAGNOSIS — K219 Gastro-esophageal reflux disease without esophagitis: Secondary | ICD-10-CM

## 2023-08-17 DIAGNOSIS — F458 Other somatoform disorders: Secondary | ICD-10-CM

## 2023-08-17 DIAGNOSIS — F0282 Dementia in other diseases classified elsewhere, unspecified severity, with psychotic disturbance: Secondary | ICD-10-CM

## 2023-08-17 DIAGNOSIS — J4540 Moderate persistent asthma, uncomplicated: Secondary | ICD-10-CM

## 2023-08-17 DIAGNOSIS — F330 Major depressive disorder, recurrent, mild: Secondary | ICD-10-CM

## 2023-08-17 DIAGNOSIS — G309 Alzheimer's disease, unspecified: Secondary | ICD-10-CM

## 2023-08-17 NOTE — Progress Notes (Signed)
 Location:  [facility] Room Number: 121/A Place of Service:  SNF [phone]) Provider:  Greig FORBES Cluster, NP   [name], MD  [name] Care Team: [name], MD as PCP - General (Internal Medicine) Brien Belvie FORBES, MD (Pulmonary Disease)  Extended Emergency Contact Information Primary Emergency Contact: [place]ham,Suzanne  [place] Home Phone: [phone] Mobile Phone: [phone] Relation: Daughter  Code Status:  DNR Goals of care: Advanced Directive information    07/26/2023    4:55 PM  Advanced Directives  Does [name] Have a Medical Advance Directive? Yes  Type of Estate Agent of [employer];Living will;Out of facility DNR (pink MOST or yellow form)  Does [name] want to make changes to medical advance directive? No - [name] declined  Copy of Healthcare Power of Attorney in Chart? Yes - validated most recent copy scanned in chart (See row information)     Chief Complaint  [name] presents with   Medical Management of Chronic Issues    HPI:  Pt is a 89 y.o. female seen today for medical management of chronic diseases.    She currently resides on the skilled nursing unit at [facility]. PMH: HTN, allergic rhinitis, asthma, GERD, Alzheimer's, OA, osteoporosis, CKD 3b, and depression.   Left hand skin cancer- scheduled Mohs surgery 02/07 HTN- BUN/creat 21/0.94 03/17/2023, nursing reports lower SBP 87/74, see trends below, remains in lasix daily  CKD- see above, GFR 55 03/17/2023 Alzheimer's with delusions- CT head 2018 chronic ischemic microvascular disease, behaviors with ADLs, non ambulatory, dependent with ADLs, sleeping more, remains on Namenda  and vistaril  prn Persistent Asthma- followed by Dr. Di seen 06/11> f/u in 1 year, remains on Dupixent , nebulized ICS/LABA and duonebs, Singulair  and Flonase  Bruxism- ongoing, remains on Seroquel   GERD- hgb 10.6 03/02/2023, remains on  Prilosec Depression- no recent mood changes, Na+ 139 03/17/2023, remains on Prozac   Recent blood pressures:  01/14- 87/74, 110/62  01/12- 148/86  01/08- 126/67  Recent weights:  01/01- 166 lbs  12/01- 161.2 lbs  11/02- 159.8 lbs  07/01- 156.2 lbs   Past Medical History:  Diagnosis Date   Age-related osteoporosis without current pathological fracture    Alzheimer's disease (HCC)    with late onset   Arthritis    Asthma    Deficiency of other specified B group vitamins    Depression    Environmental allergies    mold   Fall    GERD without esophagitis    Hyperlipidemia    Hypertension    Hypertensive kidney disease with CKD (chronic kidney disease)    with stage I through stage 4 CKD    Memory loss    Seasonal allergies    Unspecified osteoarthritis, unspecified site    Past Surgical History:  Procedure Laterality Date   HIP ARTHROPLASTY  06/25/2011   Procedure: ARTHROPLASTY BIPOLAR HIP;  Surgeon: Lynwood SQUIBB Aplington;  Location: [facility];  Service: Orthopedics;  Laterality: Right;   NASAL SINUS SURGERY     VAGINAL HYSTERECTOMY      Allergies  Allergen Reactions   Azithromycin  Other (See Comments)    Nausea, weakness    Aspirin Other (See Comments)    Unknown reaction   Erythromycin    Molds & Smuts    Other     Seasonal Allergies.   Septra [Sulfamethoxazole-Trimethoprim]    Sulfa Antibiotics    Sulfonamide Derivatives Other (See Comments)    Unknown reaction    Outpatient Encounter Medications as of  08/17/2023  Medication Sig   acetaminophen  (TYLENOL ) 500 MG tablet Take 500 mg by mouth 3 (three) times daily.   benzonatate  (TESSALON ) 200 MG capsule Take 1 capsule (200 mg total) by mouth 3 (three) times daily as needed for cough.   budesonide  (PULMICORT ) 0.5 MG/2ML nebulizer solution Take 0.5 mg by nebulization 2 (two) times daily.   Calcium Carb-Cholecalciferol (CALCIUM 600-D PO) Take 1 tablet by mouth daily.   Cholecalciferol (VITAMIN D) 2000 units tablet  Take 2,000 Units by mouth at bedtime.   dextromethorphan (DELSYM) 30 MG/5ML liquid Take 60 mg by mouth every 12 (twelve) hours as needed for cough.   dupilumab  (DUPIXENT ) 300 MG/2ML prefilled syringe Inject 300 mg into the skin every 14 (fourteen) days.   FLUoxetine (PROZAC) 40 MG capsule Take 40 mg by mouth daily.   fluticasone  (FLONASE ) 50 MCG/ACT nasal spray Place 2 sprays into both nostrils daily. 8 am - 11 am   formoterol  (PERFOROMIST ) 20 MCG/2ML nebulizer solution Take 20 mcg by nebulization 2 (two) times daily.   furosemide (LASIX) 20 MG tablet Take 20 mg by mouth daily. Hold for SBP<120   hydrOXYzine  (ATARAX ) 25 MG tablet Take 25 mg by mouth 3 (three) times daily as needed for anxiety.   ipratropium-albuterol  (DUONEB) 0.5-2.5 (3) MG/3ML SOLN Take 3 mLs by nebulization every 6 (six) hours as needed.   memantine  (NAMENDA ) 10 MG tablet Take 1 tablet (10 mg total) by mouth 2 (two) times daily.   montelukast  (SINGULAIR ) 10 MG tablet Take 1 tablet (10 mg total) by mouth daily.   nystatin (MYCOSTATIN/NYSTOP) powder Apply 1 Application topically every morning.   omeprazole (PRILOSEC) 20 MG capsule Take 20 mg by mouth every morning. 30 minutes before breakfast.   omeprazole (PRILOSEC) 20 MG capsule Take 20 mg by mouth every evening. 30 minutes before dinner.   Polyethyl Glycol-Propyl Glycol (SYSTANE) 0.4-0.3 % GEL ophthalmic gel Place 1 application into both eyes at bedtime.   polyethylene glycol (MIRALAX  / GLYCOLAX ) packet Take 17 g by mouth every 3 (three) days.   QUEtiapine  (SEROQUEL ) 25 MG tablet Take 25 mg by mouth at bedtime.   vitamin B-12 (CYANOCOBALAMIN) 1000 MCG tablet Take 3,000 mcg by mouth daily.   [DISCONTINUED] rivaroxaban  (XARELTO ) 10 MG TABS tablet Take 1 tablet (10 mg total) by mouth daily.   No facility-administered encounter medications on file as of 08/17/2023.    Review of Systems  Unable to perform ROS: Dementia    Immunization History  Administered Date(s)  Administered   Fluad Trivalent(High Dose 65+) 05/25/2023   Influenza Inj Mdck Quad Pf 05/18/2019, 05/07/2021   Influenza Split 04/04/2012, 05/03/2013, 05/10/2015   Influenza Whole 04/18/2008, 06/04/2009, 04/03/2010, 03/30/2011   Influenza, High Dose Seasonal PF 04/20/2018, 05/24/2020   Influenza,inj,Quad PF,6+ Mos 03/26/2017   Influenza-Unspecified 03/26/2010, 04/13/2012, 05/08/2013, 05/14/2014, 06/03/2014, 05/13/2015, 04/27/2016, 05/08/2022   Moderna Covid-19 Fall Seasonal Vaccine 66yrs & older 06/08/2022   Moderna Covid-19 Vaccine  Bivalent Booster 21yrs & up 11/28/2021, 05/25/2023   Moderna SARS-COV2 Booster Vaccination 06/13/2020, 03/25/2021, 05/14/2021   Moderna Sars-Covid-2 Vaccination 09/05/2019, 10/03/2019   Pfizer Covid-19 Vaccine Bivalent Booster 51yrs & up 11/26/2022   Pneumococcal Conjugate-13 03/05/2014   Pneumococcal Polysaccharide-23 06/04/2009   Pneumococcal-Unspecified 03/13/2009   Respiratory Syncytial Virus Vaccine,Recomb Aduvanted(Arexvy) 07/23/2022   Td 03/13/2009, 04/13/2012   Tdap 12/01/2022   Zoster Recombinant(Shingrix) 08/03/2017, 10/01/2017   Zoster, Live 03/21/2010   Pertinent  Health Maintenance Due  Topic Date Due   INFLUENZA VACCINE  Completed   DEXA SCAN  Discontinued      09/26/2021    1:02 PM 08/19/2022    2:35 PM 11/27/2022   10:29 AM 02/16/2023    2:48 PM 05/26/2023    8:27 PM  Fall Risk  Falls in the past year? 0 0 0 0 0  Was there an injury with Fall? 0 0 0 0 0  Fall Risk Category Calculator 0 0 0 0 0  Fall Risk Category (Retired) Low      (RETIRED) [name] Fall Risk Level Low fall risk      [name] at Risk for Falls Due to Impaired balance/gait No Fall Risks History of fall(s) History of fall(s);Impaired balance/gait;Impaired mobility Impaired mobility;Impaired balance/gait;History of fall(s)  Fall risk Follow up Falls evaluation completed Falls evaluation completed Falls evaluation completed Falls evaluation completed;Education  provided;Falls prevention discussed Falls evaluation completed;Education provided;Falls prevention discussed   Functional Status Survey:    Vitals:   08/17/23 1845  BP: (!) 110/92  Pulse: 64  Resp: 16  Temp: 97.7 F (36.5 C)  SpO2: 97%  Weight: 166 lb (75.3 kg)  Height: 5' 2 (1.575 m)   Body mass index is 30.36 kg/m. Physical Exam Vitals reviewed.  Constitutional:      General: She is not in acute distress. HENT:     Head: Normocephalic.  Eyes:     General:        Right eye: No discharge.        Left eye: No discharge.  Cardiovascular:     Rate and Rhythm: Normal rate and regular rhythm.     Pulses: Normal pulses.     Heart sounds: Normal heart sounds.  Pulmonary:     Effort: Pulmonary effort is normal. No respiratory distress.     Breath sounds: Normal breath sounds. No wheezing or rales.  Abdominal:     General: Bowel sounds are normal.     Palpations: Abdomen is soft.  Musculoskeletal:     Cervical back: Neck supple.     Right lower leg: No edema.     Left lower leg: No edema.  Skin:    General: Skin is warm.     Capillary Refill: Capillary refill takes less than 2 seconds.  Neurological:     General: No focal deficit present.     Mental Status: She is alert. Mental status is at baseline.     Motor: Weakness present.     Gait: Gait abnormal.  Psychiatric:        Mood and Affect: Mood normal.     Labs reviewed: Recent Labs    08/20/22 0000 03/15/23 0000  NA 137 139  K 4.3 4.5  CL 105 103  CO2 21 22  BUN 28* 21  CREATININE 0.9 0.9  CALCIUM 9.5 9.3   Recent Labs    08/20/22 0000 03/15/23 0000  AST 17 17  ALT 12 12  ALKPHOS 100 113  ALBUMIN 3.3* 3.6   Recent Labs    08/20/22 0000  WBC 5.5  HGB 11.1*  HCT 33*  PLT 186   Lab Results  Component Value Date   TSH 1.52 09/23/2020   No results found for: HGBA1C No results found for: CHOL, HDL, LDLCALC, LDLDIRECT, TRIG, CHOLHDL  Significant Diagnostic Results in last 30  days:  No results found.  Assessment/Plan 1. Weight gain (Primary) - weight up 10 lbs from 02/2023 - sedentary lifestyle - TSH next lab draw - cont monthly weights  2. Primary cancer of skin of left hand -  followed by dermatology - Mohs scheduled 02/07> may include spot on right hand> biopsy pending  3. Essential hypertension - nursing reports low SBP - per chart review SBP > 110 - cont furosemide   4. Stage 3a chronic kidney disease (HCC) - encourage hydration with water - avoid NSAIDS  5. 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 Moderate persistent asthma without complication - cont Dupixent , nebulized ICS/LABA and duonebs, Singulair  and Flonase    7. Bruxism - improved with Seroquel   8. Gastroesophageal reflux disease without esophagitis - hgb stable - cont omeprazole  9. Mild episode of recurrent major depressive disorder (HCC) - no mood changes - Na+ stable - cont Prozac    Family/ staff Communication: plan discussed with [name] and nurse  Labs/tests ordered:  none

## 2023-09-14 ENCOUNTER — Encounter: Payer: Self-pay | Admitting: Orthopedic Surgery

## 2023-09-14 ENCOUNTER — Non-Acute Institutional Stay (SKILLED_NURSING_FACILITY): Payer: Self-pay | Admitting: Orthopedic Surgery

## 2023-09-14 DIAGNOSIS — G309 Alzheimer's disease, unspecified: Secondary | ICD-10-CM

## 2023-09-14 DIAGNOSIS — K219 Gastro-esophageal reflux disease without esophagitis: Secondary | ICD-10-CM

## 2023-09-14 DIAGNOSIS — F339 Major depressive disorder, recurrent, unspecified: Secondary | ICD-10-CM

## 2023-09-14 DIAGNOSIS — C44609 Unspecified malignant neoplasm of skin of left upper limb, including shoulder: Secondary | ICD-10-CM

## 2023-09-14 DIAGNOSIS — N1831 Chronic kidney disease, stage 3a: Secondary | ICD-10-CM

## 2023-09-14 DIAGNOSIS — I10 Essential (primary) hypertension: Secondary | ICD-10-CM

## 2023-09-14 DIAGNOSIS — J4540 Moderate persistent asthma, uncomplicated: Secondary | ICD-10-CM

## 2023-09-14 DIAGNOSIS — F0282 Dementia in other diseases classified elsewhere, unspecified severity, with psychotic disturbance: Secondary | ICD-10-CM

## 2023-09-14 DIAGNOSIS — F458 Other somatoform disorders: Secondary | ICD-10-CM

## 2023-09-14 NOTE — Progress Notes (Signed)
Subjective:    Patient ID: [name], female    DOB: 05/31/1924, 89 y.o.   MRN: [id number]  HPI   [name] is a 89 year old female who was seen today for medical management of chronic conditions. The patient resides at [facility] on a Skilled Nursing Unit. Goals of care are comfort based.    She has a PMH: Skin cancer of the left hand s/p Moh's Surgery (09/13/2023), HTN, allergic rhinitis, moderate-severe asthma, GERD, Alzheimer's, OA, osteoporosis, CKD Stage 3b, and depression.    Left hand skin cancer : Moh's surgery performed on 09/13/2023  2.  Alzheimer's with delusions -CT head (2018) chronic ischemic microvascular disease -Dependent with ADL's -Nonambulatory  -Increased tiredness -Remains on Namenda   3. Hypertension   Recent Blood Pressures  09/07/2023: 162/82 09/01/2023: 148/82 08/25/2023:104/44   -Remains on Lasixs  -remains on Olmlesartan  4. Moderate persistent asthma without complication    - Followed by Dr.Hunsucker -- Last visit was on (01/12/2023) -Symptoms well controlled on Dupixent/ nebulized ICS/LABA and Duonebs as needed -Nasal allergies controlled with Montelukast and Flonase - Scheduled Zyrtec as needed   5. Stage 3a chronic kidney disease (HCC)  -GFR 55 on (03/17/2023)  6. Depression - no recent mood changes. Patient is eating and behaving appropriately for condition. Na+ 139 on (03/15/2023). Currently on Prozac.   Past Medical History:  Diagnosis Date   Age-related osteoporosis without current pathological fracture    Alzheimer's disease (HCC)    with late onset   Arthritis    Asthma    Deficiency of other specified B group vitamins    Depression    Environmental allergies    mold   Fall    GERD without esophagitis    Hyperlipidemia    Hypertension    Hypertensive kidney disease with CKD (chronic kidney disease)    with stage I through stage 4 CKD    Memory loss    Seasonal allergies    Unspecified osteoarthritis,  unspecified site    Past Surgical History:  Procedure Laterality Date   HIP ARTHROPLASTY  06/25/2011   Procedure: ARTHROPLASTY BIPOLAR HIP;  Surgeon: Illene Labrador Aplington;  Location: [facility];  Service: Orthopedics;  Laterality: Right;   NASAL SINUS SURGERY     VAGINAL HYSTERECTOMY     Allergies as of 09/14/2023       Reactions   Azithromycin Other (See Comments)   Nausea, weakness    Aspirin Other (See Comments)   Unknown reaction   Erythromycin    Molds & Smuts    Other    Seasonal Allergies.   Septra [sulfamethoxazole-trimethoprim]    Sulfa Antibiotics    Sulfonamide Derivatives Other (See Comments)   Unknown reaction        Medication List        Accurate as of September 14, 2023  1:30 PM. If you have any questions, ask your nurse or doctor.          acetaminophen 500 MG tablet Commonly known as: TYLENOL Take 500 mg by mouth 3 (three) times daily.   benzonatate 200 MG capsule Commonly known as: TESSALON Take 1 capsule (200 mg total) by mouth 3 (three) times daily as needed for cough.   budesonide 0.5 MG/2ML nebulizer solution Commonly known as: PULMICORT Take 0.5 mg by nebulization 2 (two) times daily.   CALCIUM 600-D PO Take 1 tablet by mouth daily.   cyanocobalamin 1000 MCG tablet Commonly known as: VITAMIN B12 Take 3,000 mcg  by mouth daily.   dextromethorphan 30 MG/5ML liquid Commonly known as: DELSYM Take 60 mg by mouth every 12 (twelve) hours as needed for cough.   Dupixent 300 MG/2ML prefilled syringe Generic drug: dupilumab Inject 300 mg into the skin every 14 (fourteen) days.   FLUoxetine 40 MG capsule Commonly known as: PROZAC Take 40 mg by mouth daily.   fluticasone 50 MCG/ACT nasal spray Commonly known as: FLONASE Place 2 sprays into both nostrils daily. 8 am - 11 am   furosemide 20 MG tablet Commonly known as: LASIX Take 20 mg by mouth daily. Hold for SBP<120   hydrOXYzine 25 MG tablet Commonly known as: ATARAX Take 25 mg by  mouth 3 (three) times daily as needed for anxiety.   ipratropium-albuterol 0.5-2.5 (3) MG/3ML Soln Commonly known as: DUONEB Take 3 mLs by nebulization every 6 (six) hours as needed.   memantine 10 MG tablet Commonly known as: Namenda Take 1 tablet (10 mg total) by mouth 2 (two) times daily.   montelukast 10 MG tablet Commonly known as: SINGULAIR Take 1 tablet (10 mg total) by mouth daily.   nystatin powder Commonly known as: MYCOSTATIN/NYSTOP Apply 1 Application topically every morning.   omeprazole 20 MG capsule Commonly known as: PRILOSEC Take 20 mg by mouth every morning. 30 minutes before breakfast.   omeprazole 20 MG capsule Commonly known as: PRILOSEC Take 20 mg by mouth every evening. 30 minutes before dinner.   Perforomist 20 MCG/2ML nebulizer solution Generic drug: formoterol Take 20 mcg by nebulization 2 (two) times daily.   polyethylene glycol 17 g packet Commonly known as: MIRALAX / GLYCOLAX Take 17 g by mouth every 3 (three) days.   QUEtiapine 25 MG tablet Commonly known as: SEROQUEL Take 25 mg by mouth at bedtime.   Systane 0.4-0.3 % Gel ophthalmic gel Generic drug: Polyethyl Glycol-Propyl Glycol Place 1 application into both eyes at bedtime.   Vitamin D 50 MCG (2000 UT) tablet Take 2,000 Units by mouth at bedtime.          Review of Systems  Unable to perform ROS: Dementia       Objective:   Physical Exam HENT:     Head: Normocephalic and atraumatic.     Right Ear: Tympanic membrane, ear canal and external ear normal.     Left Ear: Tympanic membrane, ear canal and external ear normal.     Nose: Nose normal.     Comments: Bilateral mild erythema present in the nasal turbinates.    Mouth/Throat:     Lips: Pink.     Mouth: Mucous membranes are moist.     Pharynx: Oropharynx is clear.  Eyes:     General: Lids are normal.  Cardiovascular:     Rate and Rhythm: Normal rate and regular rhythm.     Pulses: Normal pulses.     Heart  sounds: Normal heart sounds.  Pulmonary:     Effort: Pulmonary effort is normal.     Breath sounds: Normal breath sounds.  Abdominal:     General: Abdomen is flat. Bowel sounds are normal.     Palpations: Abdomen is soft.     Comments: No tenderness to palpation.   Skin:    General: Skin is warm.     Capillary Refill: Capillary refill takes less than 2 seconds.     Comments: Multiple scattered, erythematous, non-palpable macules of varying sizes are present bilaterally on the lower extremities. No signs of drainage, ulceration, or open lesions observed.  Dressing present on patients left forearm that is clean, dry, and intact (09/13/2023).   Neurological:     Mental Status: She is easily aroused. Mental status is at baseline.     Motor: Weakness present.     Gait: Gait abnormal.             Assessment & Plan:  Left hand skin cancer  -Followed by dermatology -Dressing in place on patients left forearm -cdi -Closely observe for signs of infection. -Perform scheduled wound care per nursing staff  2.  Alzheimer's with delusions - Continue Namenda -Monitor for weight loss  -Monitor for worsening mental decline   3. Hypertension -Continue Lasixs  -Continue Olmlesartan  4. Moderate persistent asthma without complication   - continue  Dupixent, nebulized ICS/LABA and duonebs, Singulair and Flonase   5. Stage 3a chronic kidney disease (HCC)  -Encourage fluids -CMP scheduled for (09/16/2023)  6. Recurrent Depression - Na+ is within normal limits -Continue Prozac

## 2023-09-14 NOTE — Progress Notes (Signed)
Location:  [facility] Room Number: 121/A Place of Service:  SNF [phone]) Provider:  Octavia Heir, NP   Mahlon Gammon, MD  Patient Care Team: Mahlon Gammon, MD as PCP - General (Internal Medicine) Storm Frisk, MD (Pulmonary Disease)  Extended Emergency Contact Information Primary Emergency Contact: Mel Almond of [place] Home Phone: [phone] Mobile Phone: [phone] Relation: Daughter  Code Status:  DNR Goals of care: Advanced Directive information    07/26/2023    4:55 PM  Advanced Directives  Does Patient Have a Medical Advance Directive? Yes  Type of Estate agent of [place];Living will;Out of facility DNR (pink MOST or yellow form)  Does patient want to make changes to medical advance directive? No - Patient declined  Copy of Healthcare Power of Attorney in Chart? Yes - validated most recent copy scanned in chart (See row information)     Chief Complaint  Patient presents with   Medical Management of Chronic Issues    HPI:  Pt is a 89 y.o. female seen today for medical management of chronic diseases.    She currently resides on the skilled nursing unit at [facility]. PMH: HTN, allergic rhinitis, asthma, GERD, Alzheimer's, OA, osteoporosis, CKD 3b, and depression.   Left hand skin cancer- scheduled Mohs surgery 02/10> has sutures Alzheimer's with delusions- CT head 2018 chronic ischemic microvascular disease, behaviors with ADLs, non ambulatory, dependent with ADLs, sleeps most of day per nursing, remains on Namenda and vistaril prn HTN- BUN/creat 21/0.94 03/17/2023, remains on lasix CKD- see above, GFR 55 03/17/2023 Persistent Asthma- followed by Dr. Judeth Horn seen 06/11> f/u in 1 year, remains on Dupixent, nebulized ICS/LABA and duonebs, Singulair and Flonase Bruxism- remains on Seroquel  GERD- hgb 10.6 03/02/2023, remains on Prilosec Depression- no recent mood changes, Na+  139 03/17/2023, remains on Prozac  No recent falls or injuries.   Recent weights:  02/01- 167.6 lbs  01/03- 166 lbs  01/01- 166.4 lbs   Recent blood pressures:  02/04- 118/62  01/29- 148/82  01/22- 104/44    Past Medical History:  Diagnosis Date   Age-related osteoporosis without current pathological fracture    Alzheimer's disease (HCC)    with late onset   Arthritis    Asthma    Deficiency of other specified B group vitamins    Depression    Environmental allergies    mold   Fall    GERD without esophagitis    Hyperlipidemia    Hypertension    Hypertensive kidney disease with CKD (chronic kidney disease)    with stage I through stage 4 CKD    Memory loss    Seasonal allergies    Unspecified osteoarthritis, unspecified site    Past Surgical History:  Procedure Laterality Date   HIP ARTHROPLASTY  06/25/2011   Procedure: ARTHROPLASTY BIPOLAR HIP;  Surgeon: Illene Labrador [place];  Location: [facility];  Service: Orthopedics;  Laterality: Right;   NASAL SINUS SURGERY     VAGINAL HYSTERECTOMY      Allergies  Allergen Reactions   Azithromycin Other (See Comments)    Nausea, weakness    Aspirin Other (See Comments)    Unknown reaction   Erythromycin    Molds & Smuts    Other     Seasonal Allergies.   Septra [Sulfamethoxazole-Trimethoprim]    Sulfa Antibiotics    Sulfonamide Derivatives Other (See Comments)    Unknown reaction    Outpatient Encounter Medications as of 09/14/2023  Medication Sig   acetaminophen (TYLENOL) 500 MG tablet Take 500 mg by mouth 3 (three) times daily.   benzonatate (TESSALON) 200 MG capsule Take 1 capsule (200 mg total) by mouth 3 (three) times daily as needed for cough.   budesonide (PULMICORT) 0.5 MG/2ML nebulizer solution Take 0.5 mg by nebulization 2 (two) times daily.   Calcium Carb-Cholecalciferol (CALCIUM 600-D PO) Take 1 tablet by mouth daily.   Cholecalciferol (VITAMIN D) 2000 units tablet Take 2,000 Units by mouth at bedtime.    dextromethorphan (DELSYM) 30 MG/5ML liquid Take 60 mg by mouth every 12 (twelve) hours as needed for cough.   dupilumab (DUPIXENT) 300 MG/2ML prefilled syringe Inject 300 mg into the skin every 14 (fourteen) days.   FLUoxetine (PROZAC) 40 MG capsule Take 40 mg by mouth daily.   fluticasone (FLONASE) 50 MCG/ACT nasal spray Place 2 sprays into both nostrils daily. 8 am - 11 am   formoterol (PERFOROMIST) 20 MCG/2ML nebulizer solution Take 20 mcg by nebulization 2 (two) times daily.   furosemide (LASIX) 20 MG tablet Take 20 mg by mouth daily. Hold for SBP<120   hydrOXYzine (ATARAX) 25 MG tablet Take 25 mg by mouth 3 (three) times daily as needed for anxiety.   ipratropium-albuterol (DUONEB) 0.5-2.5 (3) MG/3ML SOLN Take 3 mLs by nebulization every 6 (six) hours as needed.   memantine (NAMENDA) 10 MG tablet Take 1 tablet (10 mg total) by mouth 2 (two) times daily.   montelukast (SINGULAIR) 10 MG tablet Take 1 tablet (10 mg total) by mouth daily.   nystatin (MYCOSTATIN/NYSTOP) powder Apply 1 Application topically every morning.   omeprazole (PRILOSEC) 20 MG capsule Take 20 mg by mouth every morning. 30 minutes before breakfast.   omeprazole (PRILOSEC) 20 MG capsule Take 20 mg by mouth every evening. 30 minutes before dinner.   Polyethyl Glycol-Propyl Glycol (SYSTANE) 0.4-0.3 % GEL ophthalmic gel Place 1 application into both eyes at bedtime.   polyethylene glycol (MIRALAX / GLYCOLAX) packet Take 17 g by mouth every 3 (three) days.   QUEtiapine (SEROQUEL) 25 MG tablet Take 25 mg by mouth at bedtime.   vitamin B-12 (CYANOCOBALAMIN) 1000 MCG tablet Take 3,000 mcg by mouth daily.   [DISCONTINUED] rivaroxaban (XARELTO) 10 MG TABS tablet Take 1 tablet (10 mg total) by mouth daily.   No facility-administered encounter medications on file as of 09/14/2023.    Review of Systems  Unable to perform ROS: Dementia    Immunization History  Administered Date(s) Administered   Fluad Trivalent(High Dose 65+)  05/25/2023   Influenza Inj Mdck Quad Pf 05/18/2019, 05/07/2021   Influenza Split 04/04/2012, 05/03/2013, 05/10/2015   Influenza Whole 04/18/2008, 06/04/2009, 04/03/2010, 03/30/2011   Influenza, High Dose Seasonal PF 04/20/2018, 05/24/2020   Influenza,inj,Quad PF,6+ Mos 03/26/2017   Influenza-Unspecified 03/26/2010, 04/13/2012, 05/08/2013, 05/14/2014, 06/03/2014, 05/13/2015, 04/27/2016, 05/08/2022   Moderna Covid-19 Fall Seasonal Vaccine 49yrs & older 06/08/2022   Moderna Covid-19 Vaccine Bivalent Booster 27yrs & up 11/28/2021, 05/25/2023   Moderna SARS-COV2 Booster Vaccination 06/13/2020, 03/25/2021, 05/14/2021   Moderna Sars-Covid-2 Vaccination 09/05/2019, 10/03/2019   Pfizer Covid-19 Vaccine Bivalent Booster 39yrs & up 11/26/2022   Pneumococcal Conjugate-13 03/05/2014   Pneumococcal Polysaccharide-23 06/04/2009   Pneumococcal-Unspecified 03/13/2009   Respiratory Syncytial Virus Vaccine,Recomb Aduvanted(Arexvy) 07/23/2022   Td 03/13/2009, 04/13/2012   Tdap 12/01/2022   Zoster Recombinant(Shingrix) 08/03/2017, 10/01/2017   Zoster, Live 03/21/2010   Pertinent  Health Maintenance Due  Topic Date Due   INFLUENZA VACCINE  Completed   DEXA SCAN  Discontinued  08/19/2022    2:35 PM 11/27/2022   10:29 AM 02/16/2023    2:48 PM 05/26/2023    8:27 PM 08/17/2023    7:01 PM  Fall Risk  Falls in the past year? 0 0 0 0 0  Was there an injury with Fall? 0 0 0 0 0  Fall Risk Category Calculator 0 0 0 0 0  Patient at Risk for Falls Due to No Fall Risks History of fall(s) History of fall(s);Impaired balance/gait;Impaired mobility Impaired mobility;Impaired balance/gait;History of fall(s) History of fall(s);Impaired balance/gait  Fall risk Follow up Falls evaluation completed Falls evaluation completed Falls evaluation completed;Education provided;Falls prevention discussed Falls evaluation completed;Education provided;Falls prevention discussed Education provided;Falls evaluation completed    Functional Status Survey:    Vitals:   09/14/23 1125  BP: (!) 162/82  Pulse: 71  Resp: 16  Temp: (!) 97 F (36.1 C)  SpO2: 98%  Weight: 167 lb 9.6 oz (76 kg)  Height: 5\' 2"  (1.575 m)   Body mass index is 30.65 kg/m. Physical Exam Vitals reviewed.  HENT:     Head: Normocephalic.     Right Ear: There is no impacted cerumen.     Left Ear: There is no impacted cerumen.     Nose: Nose normal.     Mouth/Throat:     Mouth: Mucous membranes are moist.  Eyes:     General:        Right eye: No discharge.        Left eye: No discharge.  Cardiovascular:     Rate and Rhythm: Normal rate and regular rhythm.     Pulses: Normal pulses.     Heart sounds: Normal heart sounds.  Pulmonary:     Effort: Pulmonary effort is normal. No respiratory distress.     Breath sounds: Normal breath sounds. No wheezing or rales.  Abdominal:     General: Bowel sounds are normal.     Palpations: Abdomen is soft.  Musculoskeletal:     Cervical back: Neck supple.     Right lower leg: No edema.     Left lower leg: No edema.  Skin:    General: Skin is warm.     Capillary Refill: Capillary refill takes less than 2 seconds.     Comments: Left dorsal hand and posterior arm with surgical dressing, sutures in place, no sign of infection  Neurological:     General: No focal deficit present.     Mental Status: She is easily aroused. Mental status is at baseline.     Motor: Weakness present.     Gait: Gait abnormal.  Psychiatric:        Mood and Affect: Mood normal.     Labs reviewed: Recent Labs    03/15/23 0000  NA 139  K 4.5  CL 103  CO2 22  BUN 21  CREATININE 0.9  CALCIUM 9.3   Recent Labs    03/15/23 0000  AST 17  ALT 12  ALKPHOS 113  ALBUMIN 3.6   No results for input(s): "WBC", "NEUTROABS", "HGB", "HCT", "MCV", "PLT" in the last 8760 hours. Lab Results  Component Value Date   TSH 1.52 09/23/2020   No results found for: "HGBA1C" No results found for: "CHOL", "HDL",  "LDLCALC", "LDLDIRECT", "TRIG", "CHOLHDL"  Significant Diagnostic Results in last 30 days:  No results found.  Assessment/Plan 1. Primary cancer of skin of left hand (Primary) - followed by dermatology - 02/10 Mohs surgery> tolerated well - no sign of infection,  sutures in place  2. 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 Essential hypertension - controlled with furosemide   4. Stage 3a chronic kidney disease (HCC) - encourage hydration with water - avoid NSAIDS  5. Moderate persistent asthma without complication - no exacerbations - cont Dupixent, nebulized ICS/LABA and duonebs, Singulair and Flonase  6. Bruxism - stable with Seroquel  7. Gastroesophageal reflux disease without esophagitis - hgb stable - cont omeprazole  8. Recurrent depression (HCC) - no changes in mood - Na+ stable - cont Prozac    Family/ staff Communication: plan discussed with patient and nurse  Labs/tests ordered:  cbc/diff, cmp, TSH 09/16/2023

## 2023-09-16 ENCOUNTER — Encounter: Payer: Self-pay | Admitting: Student

## 2023-09-17 LAB — TSH: TSH: 3.24 (ref 0.41–5.90)

## 2023-09-17 LAB — COMPREHENSIVE METABOLIC PANEL WITH GFR
Albumin: 3.5 (ref 3.5–5.0)
Calcium: 9.3 (ref 8.7–10.7)
Globulin: 2.1
eGFR: 51

## 2023-09-17 LAB — BASIC METABOLIC PANEL WITH GFR
BUN: 27 — AB (ref 4–21)
CO2: 22 (ref 13–22)
Chloride: 103 (ref 99–108)
Creatinine: 1 (ref 0.5–1.1)
Glucose: 84
Potassium: 4.7 meq/L (ref 3.5–5.1)
Sodium: 137 (ref 137–147)

## 2023-09-17 LAB — HEPATIC FUNCTION PANEL
ALT: 12 U/L (ref 7–35)
AST: 17 (ref 13–35)
Alkaline Phosphatase: 89 (ref 25–125)
Bilirubin, Total: 0.2

## 2023-10-18 ENCOUNTER — Non-Acute Institutional Stay (SKILLED_NURSING_FACILITY): Payer: Self-pay | Admitting: Internal Medicine

## 2023-10-18 DIAGNOSIS — I10 Essential (primary) hypertension: Secondary | ICD-10-CM

## 2023-10-18 DIAGNOSIS — F339 Major depressive disorder, recurrent, unspecified: Secondary | ICD-10-CM

## 2023-10-18 DIAGNOSIS — F0282 Dementia in other diseases classified elsewhere, unspecified severity, with psychotic disturbance: Secondary | ICD-10-CM

## 2023-10-18 DIAGNOSIS — J4540 Moderate persistent asthma, uncomplicated: Secondary | ICD-10-CM | POA: Diagnosis not present

## 2023-10-18 DIAGNOSIS — G309 Alzheimer's disease, unspecified: Secondary | ICD-10-CM

## 2023-10-18 DIAGNOSIS — F458 Other somatoform disorders: Secondary | ICD-10-CM

## 2023-10-18 DIAGNOSIS — N1831 Chronic kidney disease, stage 3a: Secondary | ICD-10-CM | POA: Diagnosis not present

## 2023-10-18 DIAGNOSIS — K219 Gastro-esophageal reflux disease without esophagitis: Secondary | ICD-10-CM

## 2023-10-23 ENCOUNTER — Encounter: Payer: Self-pay | Admitting: Internal Medicine

## 2023-10-23 NOTE — Progress Notes (Signed)
 Location:  Medical illustrator of Service:  SNF ([id number])  Provider:   Code Status: DNR Goals of Care:     07/26/2023    4:55 PM  Advanced Directives  Does Patient Have a Medical Advance Directive? Yes  Type of Estate agent of [place];Living will;Out of facility DNR (pink MOST or yellow form)  Does patient want to make changes to medical advance directive? No - Patient declined  Copy of Healthcare Power of Attorney in Chart? Yes - validated most recent copy scanned in chart (See row information)     Chief Complaint  Patient presents with   Care Management    HPI: Patient is a 89 y.o. female seen today for medical management of chronic diseases.   Lives in SNF in [place]   Patient has a history of Alzheimer's dementia dependent for her ADLs Recent Decline with behaviors Vistaril seems to be helping with behaviors. She is now Comfort Care Osteoporosis and knee osteoarthritis CKD stage 3b Severe asthma Eosinophilic Sees Pulmonary stable in Dupixent HLD and hypertension  Doe shave history of Biting Teeth Responds to Mouth guard No Other issues Helps with feeding Hoyer Dependent Wt Readings from Last 3 Encounters:  10/18/23 164 lb (74.4 kg)  09/14/23 167 lb 9.6 oz (76 kg)  08/17/23 166 lb (75.3 kg)      Past Medical History:  Diagnosis Date   Age-related osteoporosis without current pathological fracture    Alzheimer's disease (HCC)    with late onset   Arthritis    Asthma    Deficiency of other specified B group vitamins    Depression    Environmental allergies    mold   Fall    GERD without esophagitis    Hyperlipidemia    Hypertension    Hypertensive kidney disease with CKD (chronic kidney disease)    with stage I through stage 4 CKD    Memory loss    Seasonal allergies    Unspecified osteoarthritis, unspecified site     Past Surgical History:  Procedure Laterality Date   HIP ARTHROPLASTY  06/25/2011    Procedure: ARTHROPLASTY BIPOLAR HIP;  Surgeon: Illene Labrador [place];  Location: [facility];  Service: Orthopedics;  Laterality: Right;   NASAL SINUS SURGERY     VAGINAL HYSTERECTOMY      Allergies  Allergen Reactions   Azithromycin Other (See Comments)    Nausea, weakness    Aspirin Other (See Comments)    Unknown reaction   Erythromycin    Molds & Smuts    Other     Seasonal Allergies.   Septra [Sulfamethoxazole-Trimethoprim]    Sulfa Antibiotics    Sulfonamide Derivatives Other (See Comments)    Unknown reaction    Outpatient Encounter Medications as of 10/18/2023  Medication Sig   acetaminophen (TYLENOL) 500 MG tablet Take 500 mg by mouth 3 (three) times daily.   benzonatate (TESSALON) 200 MG capsule Take 1 capsule (200 mg total) by mouth 3 (three) times daily as needed for cough.   budesonide (PULMICORT) 0.5 MG/2ML nebulizer solution Take 0.5 mg by nebulization 2 (two) times daily.   Calcium Carb-Cholecalciferol (CALCIUM 600-D PO) Take 1 tablet by mouth daily.   Cholecalciferol (VITAMIN D) 2000 units tablet Take 2,000 Units by mouth at bedtime.   dextromethorphan (DELSYM) 30 MG/5ML liquid Take 60 mg by mouth every 12 (twelve) hours as needed for cough.   dupilumab (DUPIXENT) 300 MG/2ML prefilled syringe Inject 300 mg into the skin  every 14 (fourteen) days.   FLUoxetine (PROZAC) 40 MG capsule Take 40 mg by mouth daily.   fluticasone (FLONASE) 50 MCG/ACT nasal spray Place 2 sprays into both nostrils daily. 8 am - 11 am   formoterol (PERFOROMIST) 20 MCG/2ML nebulizer solution Take 20 mcg by nebulization 2 (two) times daily.   furosemide (LASIX) 20 MG tablet Take 20 mg by mouth daily. Hold for SBP<120   hydrOXYzine (ATARAX) 25 MG tablet Take 25 mg by mouth 3 (three) times daily as needed for anxiety.   ipratropium-albuterol (DUONEB) 0.5-2.5 (3) MG/3ML SOLN Take 3 mLs by nebulization every 6 (six) hours as needed.   memantine (NAMENDA) 10 MG tablet Take 1 tablet (10 mg total) by mouth 2  (two) times daily.   montelukast (SINGULAIR) 10 MG tablet Take 1 tablet (10 mg total) by mouth daily.   nystatin (MYCOSTATIN/NYSTOP) powder Apply 1 Application topically every morning.   omeprazole (PRILOSEC) 20 MG capsule Take 20 mg by mouth every morning. 30 minutes before breakfast.   omeprazole (PRILOSEC) 20 MG capsule Take 20 mg by mouth every evening. 30 minutes before dinner.   Polyethyl Glycol-Propyl Glycol (SYSTANE) 0.4-0.3 % GEL ophthalmic gel Place 1 application into both eyes at bedtime.   polyethylene glycol (MIRALAX / GLYCOLAX) packet Take 17 g by mouth every 3 (three) days.   QUEtiapine (SEROQUEL) 25 MG tablet Take 25 mg by mouth at bedtime.   vitamin B-12 (CYANOCOBALAMIN) 1000 MCG tablet Take 3,000 mcg by mouth daily.   No facility-administered encounter medications on file as of 10/18/2023.    Review of Systems:  Review of Systems  Unable to perform ROS: Dementia    Health Maintenance  Topic Date Due   COVID-19 Vaccine (7 - 2024-25 season) 07/20/2023   Medicare Annual Wellness (AWV)  11/27/2023   DTaP/Tdap/Td (4 - Td or Tdap) 11/30/2032   Pneumonia Vaccine 72+ Years old  Completed   INFLUENZA VACCINE  Completed   Zoster Vaccines- Shingrix  Completed   HPV VACCINES  Aged Out   DEXA SCAN  Discontinued    Physical Exam: Vitals:   10/18/23 1839  BP: 138/69  Pulse: 66  Temp: (!) 97.5 F (36.4 C)  Weight: 164 lb (74.4 kg)   Body mass index is 30 kg/m. Physical Exam Vitals reviewed.  Constitutional:      Appearance: Normal appearance.  HENT:     Head: Normocephalic.     Nose: Nose normal.     Mouth/Throat:     Mouth: Mucous membranes are moist.     Pharynx: Oropharynx is clear.  Eyes:     Pupils: Pupils are equal, round, and reactive to light.  Cardiovascular:     Rate and Rhythm: Normal rate and regular rhythm.     Pulses: Normal pulses.     Heart sounds: Normal heart sounds. No murmur heard. Pulmonary:     Effort: Pulmonary effort is normal.      Breath sounds: Normal breath sounds.  Abdominal:     General: Abdomen is flat. Bowel sounds are normal.     Palpations: Abdomen is soft.  Musculoskeletal:        General: No swelling.     Cervical back: Neck supple.  Skin:    General: Skin is warm.  Neurological:     General: No focal deficit present.     Mental Status: She is alert.  Psychiatric:        Mood and Affect: Mood normal.  Thought Content: Thought content normal.     Labs reviewed: Basic Metabolic Panel: Recent Labs    03/15/23 0000  NA 139  K 4.5  CL 103  CO2 22  BUN 21  CREATININE 0.9  CALCIUM 9.3   Liver Function Tests: Recent Labs    03/15/23 0000  AST 17  ALT 12  ALKPHOS 113  ALBUMIN 3.6   No results for input(s): "LIPASE", "AMYLASE" in the last 8760 hours. No results for input(s): "AMMONIA" in the last 8760 hours. CBC: No results for input(s): "WBC", "NEUTROABS", "HGB", "HCT", "MCV", "PLT" in the last 8760 hours. Lipid Panel: No results for input(s): "CHOL", "HDL", "LDLCALC", "TRIG", "CHOLHDL", "LDLDIRECT" in the last 8760 hours. No results found for: "HGBA1C"  Procedures since last visit: No results found.  Assessment/Plan 1. Dementia in Alzheimer's disease with delusions (HCC) (Primary) Full SNF level Care Atarax For behaviors On Namenda for behaviors 2. Essential hypertension Off All meds except on Low dose of Furosemide  3. Stage 3a chronic kidney disease (HCC) Creat stable  4. Moderate persistent asthma without complication Dupixent and Pulmicort  5. Bruxism Mouth guard and Seroquel  6. Gastroesophageal reflux disease without esophagitis Prilosec  7. Recurrent depression (HCC) Prozac 8 Anemia Hgb 10.9  No Work up due to her Age Continue Monitor    Labs/tests ordered:  * No order type specified * Next appt:  Visit date not found

## 2023-11-09 ENCOUNTER — Non-Acute Institutional Stay (SKILLED_NURSING_FACILITY): Payer: Self-pay | Admitting: Orthopedic Surgery

## 2023-11-09 ENCOUNTER — Encounter: Payer: Self-pay | Admitting: Orthopedic Surgery

## 2023-11-09 DIAGNOSIS — N1831 Chronic kidney disease, stage 3a: Secondary | ICD-10-CM | POA: Diagnosis not present

## 2023-11-09 DIAGNOSIS — F0282 Dementia in other diseases classified elsewhere, unspecified severity, with psychotic disturbance: Secondary | ICD-10-CM

## 2023-11-09 DIAGNOSIS — L309 Dermatitis, unspecified: Secondary | ICD-10-CM | POA: Diagnosis not present

## 2023-11-09 DIAGNOSIS — I10 Essential (primary) hypertension: Secondary | ICD-10-CM | POA: Diagnosis not present

## 2023-11-09 DIAGNOSIS — G309 Alzheimer's disease, unspecified: Secondary | ICD-10-CM | POA: Diagnosis not present

## 2023-11-09 DIAGNOSIS — F339 Major depressive disorder, recurrent, unspecified: Secondary | ICD-10-CM

## 2023-11-09 DIAGNOSIS — F458 Other somatoform disorders: Secondary | ICD-10-CM

## 2023-11-09 DIAGNOSIS — J4540 Moderate persistent asthma, uncomplicated: Secondary | ICD-10-CM

## 2023-11-09 DIAGNOSIS — K219 Gastro-esophageal reflux disease without esophagitis: Secondary | ICD-10-CM

## 2023-11-09 MED ORDER — HYDROCORTISONE 1 % EX CREA
TOPICAL_CREAM | CUTANEOUS | Status: AC
Start: 2023-11-09 — End: 2023-12-07

## 2023-11-09 NOTE — Progress Notes (Signed)
 Location:  [facility] Room Number: 121/A Place of Service:  SNF [phone]) Provider:  Octavia Heir, NP   Mahlon Gammon, MD  Patient Care Team: Mahlon Gammon, MD as PCP - General (Internal Medicine) Storm Frisk, MD (Pulmonary Disease)  Extended Emergency Contact Information Primary Emergency Contact: Mel Almond of [place] Home Phone: [phone] Mobile Phone: [phone] Relation: Daughter  Code Status:  DNR Goals of care: Advanced Directive information    07/26/2023    4:55 PM  Advanced Directives  Does Patient Have a Medical Advance Directive? Yes  Type of Estate agent of [place];Living will;Out of facility DNR (pink MOST or yellow form)  Does patient want to make changes to medical advance directive? No - Patient declined  Copy of Healthcare Power of Attorney in Chart? Yes - validated most recent copy scanned in chart (See row information)     Chief Complaint  Patient presents with   Medical Management of Chronic Issues    HPI:  Pt is a 89 y.o. female seen today for medical management of chronic diseases.    She currently resides on the skilled nursing unit at [facility]. PMH: HTN, allergic rhinitis, asthma, GERD, Alzheimer's, OA, osteoporosis, CKD 3b, and depression.   Alzheimer's with delusions- CT head 2018 chronic ischemic microvascular disease, behaviors with ADLs, non ambulatory, dependent with ADLs, sleeps most of day per nursing (TSH 3.24 09/17/2023), remains on Namenda and vistaril prn HTN- BUN/creat 27.2/1.0 09/17/2023, remains on lasix CKD- see above, GFR 51 (02/14)> was 55 03/17/2023 Persistent Asthma- followed by Dr. Judeth Horn seen 06/11> f/u in 1 year, remains on Dupixent, nebulized ICS/LABA and duonebs, Singulair and Flonase Bruxism- remains on Seroquel  GERD- hgb 10.9 09/17/2023, remains on Prilosec Depression- no recent mood changes, Na+ 137 09/17/2023, remains on  Prozac   Past Medical History:  Diagnosis Date   Age-related osteoporosis without current pathological fracture    Alzheimer's disease (HCC)    with late onset   Arthritis    Asthma    Deficiency of other specified B group vitamins    Depression    Environmental allergies    mold   Fall    GERD without esophagitis    Hyperlipidemia    Hypertension    Hypertensive kidney disease with CKD (chronic kidney disease)    with stage I through stage 4 CKD    Memory loss    Seasonal allergies    Unspecified osteoarthritis, unspecified site    Past Surgical History:  Procedure Laterality Date   HIP ARTHROPLASTY  06/25/2011   Procedure: ARTHROPLASTY BIPOLAR HIP;  Surgeon: Illene Labrador [place];  Location: [facility];  Service: Orthopedics;  Laterality: Right;   NASAL SINUS SURGERY     VAGINAL HYSTERECTOMY      Allergies  Allergen Reactions   Azithromycin Other (See Comments)    Nausea, weakness    Aspirin Other (See Comments)    Unknown reaction   Erythromycin    Molds & Smuts    Other     Seasonal Allergies.   Septra [Sulfamethoxazole-Trimethoprim]    Sulfa Antibiotics    Sulfonamide Derivatives Other (See Comments)    Unknown reaction    Outpatient Encounter Medications as of 11/09/2023  Medication Sig   acetaminophen (TYLENOL) 500 MG tablet Take 500 mg by mouth 3 (three) times daily.   benzonatate (TESSALON) 200 MG capsule Take 1 capsule (200 mg total) by mouth 3 (three) times daily as needed for cough.  budesonide (PULMICORT) 0.5 MG/2ML nebulizer solution Take 0.5 mg by nebulization 2 (two) times daily.   Calcium Carb-Cholecalciferol (CALCIUM 600-D PO) Take 1 tablet by mouth daily.   Cholecalciferol (VITAMIN D) 2000 units tablet Take 2,000 Units by mouth at bedtime.   dextromethorphan (DELSYM) 30 MG/5ML liquid Take 60 mg by mouth every 12 (twelve) hours as needed for cough.   dupilumab (DUPIXENT) 300 MG/2ML prefilled syringe Inject 300 mg into the skin every 14 (fourteen) days.    FLUoxetine (PROZAC) 40 MG capsule Take 40 mg by mouth daily.   fluticasone (FLONASE) 50 MCG/ACT nasal spray Place 2 sprays into both nostrils daily. 8 am - 11 am   formoterol (PERFOROMIST) 20 MCG/2ML nebulizer solution Take 20 mcg by nebulization 2 (two) times daily.   furosemide (LASIX) 20 MG tablet Take 20 mg by mouth daily. Hold for SBP<120   hydrOXYzine (ATARAX) 25 MG tablet Take 25 mg by mouth 3 (three) times daily as needed for anxiety.   ipratropium-albuterol (DUONEB) 0.5-2.5 (3) MG/3ML SOLN Take 3 mLs by nebulization every 6 (six) hours as needed.   memantine (NAMENDA) 10 MG tablet Take 1 tablet (10 mg total) by mouth 2 (two) times daily.   montelukast (SINGULAIR) 10 MG tablet Take 1 tablet (10 mg total) by mouth daily.   nystatin (MYCOSTATIN/NYSTOP) powder Apply 1 Application topically every morning.   omeprazole (PRILOSEC) 20 MG capsule Take 20 mg by mouth every morning. 30 minutes before breakfast.   omeprazole (PRILOSEC) 20 MG capsule Take 20 mg by mouth every evening. 30 minutes before dinner.   Polyethyl Glycol-Propyl Glycol (SYSTANE) 0.4-0.3 % GEL ophthalmic gel Place 1 application into both eyes at bedtime.   polyethylene glycol (MIRALAX / GLYCOLAX) packet Take 17 g by mouth every 3 (three) days.   QUEtiapine (SEROQUEL) 25 MG tablet Take 25 mg by mouth at bedtime.   vitamin B-12 (CYANOCOBALAMIN) 1000 MCG tablet Take 3,000 mcg by mouth daily.   [DISCONTINUED] rivaroxaban (XARELTO) 10 MG TABS tablet Take 1 tablet (10 mg total) by mouth daily.   No facility-administered encounter medications on file as of 11/09/2023.    Review of Systems  Unable to perform ROS: Dementia    Immunization History  Administered Date(s) Administered   Fluad Trivalent(High Dose 65+) 05/25/2023   Influenza Inj Mdck Quad Pf 05/18/2019, 05/07/2021   Influenza Split 04/04/2012, 05/03/2013, 05/10/2015   Influenza Whole 04/18/2008, 06/04/2009, 04/03/2010, 03/30/2011   Influenza, High Dose Seasonal PF  04/20/2018, 05/24/2020   Influenza,inj,Quad PF,6+ Mos 03/26/2017   Influenza-Unspecified 03/26/2010, 04/13/2012, 05/08/2013, 05/14/2014, 06/03/2014, 05/13/2015, 04/27/2016, 05/08/2022   Moderna Covid-19 Fall Seasonal Vaccine 51yrs & older 06/08/2022   Moderna Covid-19 Vaccine Bivalent Booster 70yrs & up 11/28/2021, 05/25/2023   Moderna SARS-COV2 Booster Vaccination 06/13/2020, 03/25/2021, 05/14/2021   Moderna Sars-Covid-2 Vaccination 09/05/2019, 10/03/2019   Pfizer Covid-19 Vaccine Bivalent Booster 49yrs & up 11/26/2022   Pneumococcal Conjugate-13 03/05/2014   Pneumococcal Polysaccharide-23 06/04/2009   Pneumococcal-Unspecified 03/13/2009   Respiratory Syncytial Virus Vaccine,Recomb Aduvanted(Arexvy) 07/23/2022   Td 03/13/2009, 04/13/2012   Tdap 12/01/2022   Zoster Recombinant(Shingrix) 08/03/2017, 10/01/2017   Zoster, Live 03/21/2010   Pertinent  Health Maintenance Due  Topic Date Due   INFLUENZA VACCINE  03/03/2024   DEXA SCAN  Discontinued      08/19/2022    2:35 PM 11/27/2022   10:29 AM 02/16/2023    2:48 PM 05/26/2023    8:27 PM 08/17/2023    7:01 PM  Fall Risk  Falls in the past year? 0  0 0 0 0  Was there an injury with Fall? 0 0 0 0 0  Fall Risk Category Calculator 0 0 0 0 0  Patient at Risk for Falls Due to No Fall Risks History of fall(s) History of fall(s);Impaired balance/gait;Impaired mobility Impaired mobility;Impaired balance/gait;History of fall(s) History of fall(s);Impaired balance/gait  Fall risk Follow up Falls evaluation completed Falls evaluation completed Falls evaluation completed;Education provided;Falls prevention discussed Falls evaluation completed;Education provided;Falls prevention discussed Education provided;Falls evaluation completed   Functional Status Survey:    Vitals:   11/09/23 1224  BP: (!) 92/55  Pulse: 77  Resp: 17  Temp: (!) 97.2 F (36.2 C)  SpO2: 99%  Weight: 167 lb 6.4 oz (75.9 kg)  Height: 5\' 2"  (1.575 m)   Body mass index is  30.62 kg/m. Physical Exam Vitals reviewed.  HENT:     Head: Normocephalic.     Right Ear: There is no impacted cerumen.     Left Ear: There is impacted cerumen.     Nose: Nose normal.     Mouth/Throat:     Mouth: Mucous membranes are moist.  Eyes:     General:        Right eye: No discharge.        Left eye: No discharge.  Cardiovascular:     Rate and Rhythm: Normal rate and regular rhythm.     Pulses: Normal pulses.     Heart sounds: Normal heart sounds.  Pulmonary:     Effort: Pulmonary effort is normal.     Breath sounds: Normal breath sounds.  Abdominal:     General: Bowel sounds are normal.     Palpations: Abdomen is soft.  Musculoskeletal:     Cervical back: Neck supple.     Right lower leg: No edema.     Left lower leg: No edema.     Comments: Compression stockings on  Skin:    General: Skin is warm.     Capillary Refill: Capillary refill takes less than 2 seconds.     Comments: Pea sised open lesion to neck and left anterior forearm, no sign of infection, skin appears dry with some scratch marks  Neurological:     General: No focal deficit present.     Mental Status: She is easily aroused. Mental status is at baseline.     Motor: Weakness present.     Gait: Gait abnormal.     Comments: juditta  Psychiatric:        Mood and Affect: Mood normal.     Comments: Alert to self/familiar face, disoriented to place/time/situation, able to state birthday, did not follow commands     Labs reviewed: Recent Labs    03/15/23 0000  NA 139  K 4.5  CL 103  CO2 22  BUN 21  CREATININE 0.9  CALCIUM 9.3   Recent Labs    03/15/23 0000  AST 17  ALT 12  ALKPHOS 113  ALBUMIN 3.6   No results for input(s): "WBC", "NEUTROABS", "HGB", "HCT", "MCV", "PLT" in the last 8760 hours. Lab Results  Component Value Date   TSH 1.52 09/23/2020   No results found for: "HGBA1C" No results found for: "CHOL", "HDL", "LDLCALC", "LDLDIRECT", "TRIG", "CHOLHDL"  Significant  Diagnostic Results in last 30 days:  No results found.  Assessment/Plan 1. Eczema, unspecified type (Primary) - noted to left anterior FA and neck - no infection, skin appears dry - start hydrocortisone 1% cream bid x 7 days, then BID PRN x  21 days  2. 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 Essential hypertension - controlled  - cont furosemide- hold is SBP < 120  4. Stage 3a chronic kidney disease (HCC) - encourage hydration with water - avoid NSAIDS  5. Moderate persistent asthma without complication - no recent exacerbations - Dupixent, nebulized ICS/LABA and duonebs, Singulair and Flonase   6. Bruxism - stable with Seroquel   7. Gastroesophageal reflux disease without esophagitis - hgb stable  - cont omeprazole  8. Recurrent depression (HCC) - Na+ stable - cont prozac    Family/ staff Communication: plan discussed with patient and nurse  Labs/tests ordered:  none

## 2023-11-09 NOTE — Progress Notes (Signed)
 Subjective:     Patient ID: [name], female   DOB: 12-13-1923, 89 y.o.   MRN: [id number] Chief Complaint:  Patient presents for routine medical management of multiple chronic health conditions.  HPI: A 89 year old female with complex medical history, residing in the SNF. She is non-ambulatory and dependent for all ADLs. Care is focused on comfort and quality of life. She recently underwent Mohs surgery for left-hand skin cancer on 09/13/2023. Area of eczema noted on L arm and neck and will pursue a trial of hydrocortisone 1% cream. Alzheimer's disease is stable at baseline but with increased daytime sleepiness per nursing staff. No acute behavioral changes. Depression appears well managed; patient is eating appropriately. Blood pressures have remained mostly within goal range last checked this am 147/80. No reported falls or injuries.  Asthma remains controlled on current inhalers. GERD, CKD stage 3a, and HTN continue to be medically managed.  Review of Systems: Unable to complete ROS due to advanced dementia.  Nursing staff reports no recent fevers, cough, chest pain, SOB, urinary complaints, or any signs of distress.    Objective:   Physical Exam Constitutional:      Appearance: Normal appearance. She is obese.  HENT:     Head: Normocephalic and atraumatic.     Right Ear: Tympanic membrane, ear canal and external ear normal.     Left Ear: Tympanic membrane, ear canal and external ear normal.     Ears:     Comments: Yellow cerumen left ear more than right but not occluding Tympanic membranes visualization.    Nose: Nose normal.     Mouth/Throat:     Mouth: Mucous membranes are moist.  Eyes:     Conjunctiva/sclera: Conjunctivae normal.     Pupils: Pupils are equal, round, and reactive to light.  Cardiovascular:     Rate and Rhythm: Normal rate and regular rhythm.     Pulses: Normal pulses.     Heart sounds: No murmur heard.    No friction rub. No gallop.  Pulmonary:      Effort: Pulmonary effort is normal.     Breath sounds: Normal breath sounds.  Abdominal:     General: Bowel sounds are normal.  Musculoskeletal:        General: Normal range of motion.     Cervical back: Normal range of motion and neck supple.     Right lower leg: No edema.     Left lower leg: No edema.  Skin:    General: Skin is warm and dry.     Capillary Refill: Capillary refill takes less than 2 seconds.     Comments: Purpura on left leg mid shin area and eczematous area on left forearm.  Neurological:     Mental Status: She is alert. Mental status is at baseline. She is disoriented.  Psychiatric:     Comments: Appeared with improved depression and at baseline of cognitive impairment due to advanced Alzheimer's. Presently cooperating well with Nursing staff.        Assessment:     Atopic Dermatitis (Eczema) of Left Arm - L20.82 (Other atopic dermatitis) Patient presents with erythematous dry, pruritic patches on the left arm. No signs of secondary infection (no drainage, warmth, or pustules). Likely chronic due to age and known history of environmental allergies (mold, seasonal). No known contact allergens or recent new topical agents reported  Primary skin cancer of left hand s/p Mohs surgery 09/13/2023; well-tolerated. No signs of infection.   Alzheimer's disease with  delusions (HCC) stable at baseline  Dependent for all ADLs, baseline mental status preserved.  Sleeping more during the day,   and TSH 3.24 WNL, weight increase at 167.4 lbs on April, 2025, 164.2 lbs March, 2025, 167.6 lbs on February, 2025, and 159.8 lbs from November, 2024. This pattern may reflect dietary, activity level, fluid retention, or medication-related change, however no SOB or dependent edema noted at this time.  Continue Namenda and Seroquel.   Essential Hypertension  Generally controlled. Monitor for hypotensive episodes (BP 104/44 on 08/25/2023). Today blood pressure stable at  147/80. Continue Olmesartan and Furosemide (hold for SBP <120).  Moderate persistent asthma Controlled on Dupixent, nebulized ICS/LABA, Duonebs, Singulair, and Flonase.  Last pulmonology f/u: 01/12/2023. No exacerbations.  Chronic Kidney Disease Stage 3a (HCC) GFR 55. Stable renal function. Encourage fluid intake. Avoid nephrotoxins.  Recurrent Depression (HCC) Mood stable. Eating well. Na+ WNL. Continue Prozac.  GERD without esophagitis  Hgb 10.6 on 03/02/2023. No signs of GI bleeding. Continue omeprazole.  Bruxism Stable on Seroquel at bedtime.    Plan:     Atopic Dermatitis (Eczema) of Left Arm - L20.82 (Other atopic dermatitis) Hydrocortisone 1% cream BID X 7 days, then PRN BID X 3 weeks.  Skin Cancer: Continue to monitor surgical site. Wound care per nursing protocol. No changes unless signs of infection appear.  Alzheimer's Disease: Continue Namenda 10 mg BID and Seroquel 25 mg QHS. Monitor mental status, weight, sleep pattern. Encourage engagement with staff as tolerated.  Hypertension: Continue Olmesartan and Lasix 20 mg daily. Monitor BP regularly; hold Lasix for SBP <120.  Asthma: Maintain current regimen. Monitor for respiratory changes. Reinforce adherence to inhaler/nebulizer schedule.  CKD 3a: Avoid NSAIDs. Encourage hydration.   Depression: Continue Prozac. Monitor mood, appetite, engagement.  GERD: Continue Omeprazole 20 mg BID. Monitor Hgb if symptoms change.  Bruxism: Continue Seroquel. Reassess for dental referral if bruxism worsens.

## 2023-12-07 ENCOUNTER — Encounter: Payer: Self-pay | Admitting: Orthopedic Surgery

## 2023-12-07 ENCOUNTER — Non-Acute Institutional Stay (SKILLED_NURSING_FACILITY): Payer: Self-pay | Admitting: Orthopedic Surgery

## 2023-12-07 DIAGNOSIS — I10 Essential (primary) hypertension: Secondary | ICD-10-CM

## 2023-12-07 DIAGNOSIS — R634 Abnormal weight loss: Secondary | ICD-10-CM | POA: Diagnosis not present

## 2023-12-07 DIAGNOSIS — J4540 Moderate persistent asthma, uncomplicated: Secondary | ICD-10-CM

## 2023-12-07 DIAGNOSIS — F339 Major depressive disorder, recurrent, unspecified: Secondary | ICD-10-CM

## 2023-12-07 DIAGNOSIS — F458 Other somatoform disorders: Secondary | ICD-10-CM

## 2023-12-07 DIAGNOSIS — F0282 Dementia in other diseases classified elsewhere, unspecified severity, with psychotic disturbance: Secondary | ICD-10-CM

## 2023-12-07 DIAGNOSIS — N1831 Chronic kidney disease, stage 3a: Secondary | ICD-10-CM | POA: Diagnosis not present

## 2023-12-07 DIAGNOSIS — G309 Alzheimer's disease, unspecified: Secondary | ICD-10-CM

## 2023-12-07 DIAGNOSIS — K219 Gastro-esophageal reflux disease without esophagitis: Secondary | ICD-10-CM

## 2023-12-07 NOTE — Progress Notes (Unsigned)
 Location:  [facility] Room Number: 121/A Place of Service:  SNF (31) Provider:  Curvin Downing    Patient Care Team: Marguerite Shiley, MD as PCP - General (Internal Medicine) Vernell Goldsmith, MD (Pulmonary Disease)  Extended Emergency Contact Information Primary Emergency Contact: Burmingham,Suzanne  [place] Home Phone: [phone] Mobile Phone: [phone] Relation: Daughter  Code Status:  DNR Goals of care: Advanced Directive information    12/07/2023    2:22 PM  Advanced Directives  Does Patient Have a Medical Advance Directive? Yes  Type of Estate agent of [place];Living will;Out of facility DNR (pink MOST or yellow form)  Does patient want to make changes to medical advance directive? No - Patient declined  Copy of Healthcare Power of Attorney in Chart? Yes - validated most recent copy scanned in chart (See row information)  Pre-existing out of facility DNR order (yellow form or pink MOST form) Pink MOST/Yellow Form most recent copy in chart - Physician notified to receive inpatient order     Chief Complaint  Patient presents with   Medical Management of Chronic Issues    Routine Visit    HPI:  Pt is a 89 y.o. female seen today for medical management of chronic diseases.    She currently resides on the skilled nursing unit at [facility]. PMH: HTN, allergic rhinitis, asthma, GERD, Alzheimer's, OA, osteoporosis, CKD 3b, and depression.  Weight loss- see trends below  Alzheimer's with delusions- CT head 2018 chronic ischemic microvascular disease, behaviors with ADLs, non ambulatory, dependent with ADLs, sleeps most of day per nursing (TSH 3.24 09/17/2023), remains on Namenda  and vistaril  prn HTN- BUN/creat 27.2/1.0 09/17/2023, remains on lasix CKD- see above, GFR 51 (02/14)> was 55 03/17/2023 Persistent Asthma- followed by Dr. Marygrace Snellen seen 06/11> f/u in 1 year, remains on Dupixent ,  nebulized ICS/LABA and duonebs, Singulair  and Flonase  Bruxism- remains on Seroquel   GERD- hgb 10.9 09/17/2023, remains on Prilosec Depression- no recent mood changes, Na+ 137 09/17/2023, remains on Prozac  No recent falls or injuries.   Recent blood pressures:  05/06- 150/60  04/29- 123/52  04/25- 152/73  Recent weights:  05/05- 147 lbs  04/07- 167.4 lbs  03/01- 164.2 lbs     Past Medical History:  Diagnosis Date   Age-related osteoporosis without current pathological fracture    Alzheimer's disease (HCC)    with late onset   Arthritis    Asthma    Deficiency of other specified B group vitamins    Depression    Environmental allergies    mold   Fall    GERD without esophagitis    Hyperlipidemia    Hypertension    Hypertensive kidney disease with CKD (chronic kidney disease)    with stage I through stage 4 CKD    Memory loss    Seasonal allergies    Unspecified osteoarthritis, unspecified site    Past Surgical History:  Procedure Laterality Date   HIP ARTHROPLASTY  06/25/2011   Procedure: ARTHROPLASTY BIPOLAR HIP;  Surgeon: Robbie Chiles [place];  Location: [facility];  Service: Orthopedics;  Laterality: Right;   NASAL SINUS SURGERY     VAGINAL HYSTERECTOMY      Allergies  Allergen Reactions   Azithromycin  Other (See Comments)    Nausea, weakness    Aspirin Other (See Comments)    Unknown reaction   Erythromycin    Molds & Smuts    Other     Seasonal Allergies.   Septra [Sulfamethoxazole-Trimethoprim]  Sulfa Antibiotics    Sulfonamide Derivatives Other (See Comments)    Unknown reaction    Outpatient Encounter Medications as of 12/07/2023  Medication Sig   acetaminophen  (TYLENOL ) 500 MG tablet Take 500 mg by mouth 3 (three) times daily.   benzonatate  (TESSALON ) 200 MG capsule Take 1 capsule (200 mg total) by mouth 3 (three) times daily as needed for cough.   budesonide  (PULMICORT ) 0.5 MG/2ML nebulizer solution Take 0.5 mg by nebulization 2 (two) times daily.    Calcium Carb-Cholecalciferol (CALCIUM 600-D PO) Take 1 tablet by mouth daily.   Cholecalciferol (VITAMIN D) 2000 units tablet Take 2,000 Units by mouth at bedtime.   dextromethorphan (DELSYM) 30 MG/5ML liquid Take 60 mg by mouth every 12 (twelve) hours as needed for cough.   dupilumab  (DUPIXENT ) 300 MG/2ML prefilled syringe Inject 300 mg into the skin every 14 (fourteen) days.   FLUoxetine (PROZAC) 40 MG capsule Take 40 mg by mouth daily.   fluticasone  (FLONASE ) 50 MCG/ACT nasal spray Place 2 sprays into both nostrils daily. 8 am - 11 am   formoterol  (PERFOROMIST ) 20 MCG/2ML nebulizer solution Take 20 mcg by nebulization 2 (two) times daily.   furosemide (LASIX) 20 MG tablet Take 20 mg by mouth daily. Hold for SBP<120   hydrocortisone  cream (KP HYDROCORTISONE ) 1 % Apply 1 Application topically 2 (two) times daily for 7 days, THEN 1 Application 2 (two) times daily as needed for up to 21 days for itching.   ipratropium-albuterol  (DUONEB) 0.5-2.5 (3) MG/3ML SOLN Take 3 mLs by nebulization every 6 (six) hours as needed.   memantine  (NAMENDA ) 10 MG tablet Take 1 tablet (10 mg total) by mouth 2 (two) times daily.   montelukast  (SINGULAIR ) 10 MG tablet Take 1 tablet (10 mg total) by mouth daily.   nystatin (MYCOSTATIN/NYSTOP) powder Apply 1 Application topically every morning.   omeprazole (PRILOSEC) 20 MG capsule Take 20 mg by mouth every morning. 30 minutes before breakfast.   omeprazole (PRILOSEC) 20 MG capsule Take 20 mg by mouth every evening. 30 minutes before dinner.   Polyethyl Glycol-Propyl Glycol (SYSTANE) 0.4-0.3 % GEL ophthalmic gel Place 1 application into both eyes at bedtime.   polyethylene glycol (MIRALAX  / GLYCOLAX ) packet Take 17 g by mouth every 3 (three) days.   QUEtiapine  (SEROQUEL ) 25 MG tablet Take 25 mg by mouth at bedtime.   vitamin B-12 (CYANOCOBALAMIN) 1000 MCG tablet Take 3,000 mcg by mouth daily.   hydrOXYzine  (ATARAX ) 25 MG tablet Take 25 mg by mouth 3 (three) times daily  as needed for anxiety. (Patient not taking: Reported on 12/07/2023)   [DISCONTINUED] rivaroxaban  (XARELTO ) 10 MG TABS tablet Take 1 tablet (10 mg total) by mouth daily.   No facility-administered encounter medications on file as of 12/07/2023.    Review of Systems  Unable to perform ROS: Dementia    Immunization History  Administered Date(s) Administered   Fluad Trivalent(High Dose 65+) 05/25/2023   Influenza Inj Mdck Quad Pf 05/18/2019, 05/07/2021   Influenza Split 04/04/2012, 05/03/2013, 05/10/2015   Influenza Whole 04/18/2008, 06/04/2009, 04/03/2010, 03/30/2011   Influenza, High Dose Seasonal PF 04/20/2018, 05/24/2020   Influenza,inj,Quad PF,6+ Mos 03/26/2017   Influenza-Unspecified 03/26/2010, 04/13/2012, 05/08/2013, 05/14/2014, 06/03/2014, 05/13/2015, 04/27/2016, 05/08/2022   Moderna Covid-19 Fall Seasonal Vaccine 17yrs & older 06/08/2022   Moderna Covid-19 Vaccine  Bivalent Booster 59yrs & up 11/28/2021, 05/25/2023   Moderna SARS-COV2 Booster Vaccination 06/13/2020, 03/25/2021, 05/14/2021   Moderna Sars-Covid-2 Vaccination 09/05/2019, 10/03/2019   Pfizer Covid-19 Vaccine Bivalent Booster 72yrs & up 11/26/2022  Pneumococcal Conjugate-13 03/05/2014   Pneumococcal Polysaccharide-23 06/04/2009   Pneumococcal-Unspecified 03/13/2009   Respiratory Syncytial Virus Vaccine,Recomb Aduvanted(Arexvy) 07/23/2022   Td 03/13/2009, 04/13/2012   Tdap 12/01/2022   Zoster Recombinant(Shingrix) 08/03/2017, 10/01/2017   Zoster, Live 03/21/2010   Pertinent  Health Maintenance Due  Topic Date Due   INFLUENZA VACCINE  03/03/2024   DEXA SCAN  Discontinued      08/19/2022    2:35 PM 11/27/2022   10:29 AM 02/16/2023    2:48 PM 05/26/2023    8:27 PM 08/17/2023    7:01 PM  Fall Risk  Falls in the past year? 0 0 0 0 0  Was there an injury with Fall? 0 0 0 0 0  Fall Risk Category Calculator 0 0 0 0 0  Patient at Risk for Falls Due to No Fall Risks History of fall(s) History of fall(s);Impaired  balance/gait;Impaired mobility Impaired mobility;Impaired balance/gait;History of fall(s) History of fall(s);Impaired balance/gait  Fall risk Follow up Falls evaluation completed Falls evaluation completed Falls evaluation completed;Education provided;Falls prevention discussed Falls evaluation completed;Education provided;Falls prevention discussed Education provided;Falls evaluation completed   Functional Status Survey:    Vitals:   12/07/23 1418  BP: (!) 123/52  Pulse: 65  Resp: 17  Temp: (!) 97.5 F (36.4 C)  SpO2: 98%  Weight: 147 lb (66.7 kg)  Height: 5\' 2"  (1.575 m)   Body mass index is 26.89 kg/m. Physical Exam Vitals reviewed.  Constitutional:      General: She is not in acute distress. HENT:     Head: Normocephalic.  Eyes:     General:        Right eye: No discharge.        Left eye: No discharge.  Cardiovascular:     Rate and Rhythm: Normal rate and regular rhythm.     Pulses: Normal pulses.     Heart sounds: Normal heart sounds.  Pulmonary:     Effort: Pulmonary effort is normal. No respiratory distress.     Breath sounds: Normal breath sounds. No wheezing or rales.  Abdominal:     General: Bowel sounds are normal. There is no distension.     Palpations: Abdomen is soft.     Tenderness: There is no abdominal tenderness.  Musculoskeletal:     Cervical back: Neck supple.     Right lower leg: No edema.     Left lower leg: No edema.  Skin:    General: Skin is warm.     Capillary Refill: Capillary refill takes less than 2 seconds.  Neurological:     General: No focal deficit present.     Mental Status: She is easily aroused. Mental status is at baseline.     Motor: Weakness present.     Gait: Gait abnormal.  Psychiatric:        Mood and Affect: Mood normal.     Labs reviewed: Recent Labs    03/15/23 0000  NA 139  K 4.5  CL 103  CO2 22  BUN 21  CREATININE 0.9  CALCIUM 9.3   Recent Labs    03/15/23 0000  AST 17  ALT 12  ALKPHOS 113   ALBUMIN 3.6   No results for input(s): "WBC", "NEUTROABS", "HGB", "HCT", "MCV", "PLT" in the last 8760 hours. Lab Results  Component Value Date   TSH 1.52 09/23/2020   No results found for: "HGBA1C" No results found for: "CHOL", "HDL", "LDLCALC", "LDLDIRECT", "TRIG", "CHOLHDL"  Significant Diagnostic Results in last 30 days:  No results found.  Assessment/Plan 1. Weight loss (Primary) - current weight 147 lbs> 167.4 (11/08/2023) - followed by dietary - TSH 3.24 09/17/2023 - unclear if accurate weight - she does sleep most of day - reweigh patient   2. 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 Essential hypertension - controlled without medication  4. Stage 3a chronic kidney disease (HCC) - encourage hydration with water - avoid NSAIDS  5. Moderate persistent asthma without complication - followed by pulmonary - cont Dupixent , nebulized ICS/LABA and duonebs, Singulair  and Flonase    6. Bruxism - cont Seroquel   7. Gastroesophageal reflux disease without esophagitis - hgb stable - cont omeprazole  8. Recurrent depression (HCC) - no mood changes - Na+ stable - cont Prozac    Family/ staff Communication: plan discussed with patient and nurse  Labs/tests ordered:  none

## 2024-01-05 ENCOUNTER — Encounter: Payer: Self-pay | Admitting: Orthopedic Surgery

## 2024-01-05 ENCOUNTER — Non-Acute Institutional Stay (SKILLED_NURSING_FACILITY): Payer: Self-pay | Admitting: Orthopedic Surgery

## 2024-01-05 DIAGNOSIS — Z0000 Encounter for general adult medical examination without abnormal findings: Secondary | ICD-10-CM

## 2024-01-05 NOTE — Progress Notes (Signed)
 Subjective:   [name] is a 89 y.o. female who presents for Medicare Annual (Subsequent) preventive examination.  Visit Complete: In person  Place of Service: [facility] skilled nursing unit Provider: Ulyses Gandy, AGNP-C   Patient Medicare AWV questionnaire was completed by the patient on 01/05/2024; I have confirmed that all information answered by patient is correct and no changes since this date.  Cardiac Risk Factors include: advanced age (>61men, >32 women);hypertension;sedentary lifestyle     Objective:     Today's Vitals   01/05/24 1037  BP: (!) 119/52  Pulse: 66  Resp: 20  Temp: (!) 97.3 F (36.3 C)  SpO2: 96%  Weight: 147 lb (66.7 kg)  Height: 5\' 2"  (1.575 m)   Body mass index is 26.89 kg/m.     12/07/2023    2:22 PM 07/26/2023    4:55 PM 04/28/2023    3:13 PM 03/29/2023   10:44 AM 02/16/2023   10:38 AM 01/26/2023    9:49 AM 12/07/2022   11:25 AM  Advanced Directives  Does Patient Have a Medical Advance Directive? Yes Yes Yes Yes Yes Yes Yes  Type of Estate agent of [place];Living will;Out of facility DNR (pink MOST or yellow form) Healthcare Power of [place];Living will;Out of facility DNR (pink MOST or yellow form) Healthcare Power of [place];Living will;Out of facility DNR (pink MOST or yellow form) Healthcare Power of [place];Living will;Out of facility DNR (pink MOST or yellow form) Healthcare Power of [place];Living will;Out of facility DNR (pink MOST or yellow form) Healthcare Power of [place];Living will;Out of facility DNR (pink MOST or yellow form) Healthcare Power of [place];Out of facility DNR (pink MOST or yellow form);Living will  Does patient want to make changes to medical advance directive? No - Patient declined No - Patient declined No - Patient declined No - Patient declined No - Patient declined No - Patient declined No - Patient declined  Copy of Healthcare Power of Attorney in Chart? Yes - validated most recent  copy scanned in chart (See row information) Yes - validated most recent copy scanned in chart (See row information) Yes - validated most recent copy scanned in chart (See row information) Yes - validated most recent copy scanned in chart (See row information) Yes - validated most recent copy scanned in chart (See row information) Yes - validated most recent copy scanned in chart (See row information) Yes - validated most recent copy scanned in chart (See row information)  Pre-existing out of facility DNR order (yellow form or pink MOST form) Pink MOST/Yellow Form most recent copy in chart - Physician notified to receive inpatient order      Yellow form placed in chart (order not valid for inpatient use)    Current Medications (verified) Outpatient Encounter Medications as of 01/05/2024  Medication Sig   acetaminophen  (TYLENOL ) 500 MG tablet Take 500 mg by mouth 3 (three) times daily.   benzonatate  (TESSALON ) 200 MG capsule Take 1 capsule (200 mg total) by mouth 3 (three) times daily as needed for cough.   budesonide  (PULMICORT ) 0.5 MG/2ML nebulizer solution Take 0.5 mg by nebulization 2 (two) times daily.   Calcium Carb-Cholecalciferol (CALCIUM 600-D PO) Take 1 tablet by mouth daily.   Cholecalciferol (VITAMIN D) 2000 units tablet Take 2,000 Units by mouth at bedtime.   dextromethorphan (DELSYM) 30 MG/5ML liquid Take 60 mg by mouth every 12 (twelve) hours as needed for cough.   dupilumab  (DUPIXENT ) 300 MG/2ML prefilled syringe Inject 300 mg into the skin  every 14 (fourteen) days.   FLUoxetine (PROZAC) 40 MG capsule Take 40 mg by mouth daily.   fluticasone  (FLONASE ) 50 MCG/ACT nasal spray Place 2 sprays into both nostrils daily. 8 am - 11 am   formoterol  (PERFOROMIST ) 20 MCG/2ML nebulizer solution Take 20 mcg by nebulization 2 (two) times daily.   furosemide (LASIX) 20 MG tablet Take 20 mg by mouth daily. Hold for SBP<120   hydrOXYzine  (ATARAX ) 25 MG tablet Take 25 mg by mouth 3 (three) times daily as  needed for anxiety.   ipratropium-albuterol  (DUONEB) 0.5-2.5 (3) MG/3ML SOLN Take 3 mLs by nebulization every 6 (six) hours as needed.   memantine  (NAMENDA ) 10 MG tablet Take 1 tablet (10 mg total) by mouth 2 (two) times daily.   montelukast  (SINGULAIR ) 10 MG tablet Take 1 tablet (10 mg total) by mouth daily.   nystatin (MYCOSTATIN/NYSTOP) powder Apply 1 Application topically every morning.   omeprazole (PRILOSEC) 20 MG capsule Take 20 mg by mouth every morning. 30 minutes before breakfast.   omeprazole (PRILOSEC) 20 MG capsule Take 20 mg by mouth every evening. 30 minutes before dinner.   Polyethyl Glycol-Propyl Glycol (SYSTANE) 0.4-0.3 % GEL ophthalmic gel Place 1 application into both eyes at bedtime.   polyethylene glycol (MIRALAX  / GLYCOLAX ) packet Take 17 g by mouth every 3 (three) days.   QUEtiapine  (SEROQUEL ) 25 MG tablet Take 25 mg by mouth at bedtime.   vitamin B-12 (CYANOCOBALAMIN) 1000 MCG tablet Take 3,000 mcg by mouth daily.   [DISCONTINUED] rivaroxaban  (XARELTO ) 10 MG TABS tablet Take 1 tablet (10 mg total) by mouth daily.   No facility-administered encounter medications on file as of 01/05/2024.    Allergies (verified) Azithromycin , Aspirin, Erythromycin, Molds & smuts, Other, Septra [sulfamethoxazole-trimethoprim], Sulfa antibiotics, and Sulfonamide derivatives   History: Past Medical History:  Diagnosis Date   Age-related osteoporosis without current pathological fracture    Alzheimer's disease (HCC)    with late onset   Arthritis    Asthma    Deficiency of other specified B group vitamins    Depression    Environmental allergies    mold   Fall    GERD without esophagitis    Hyperlipidemia    Hypertension    Hypertensive kidney disease with CKD (chronic kidney disease)    with stage I through stage 4 CKD    Memory loss    Seasonal allergies    Unspecified osteoarthritis, unspecified site    Past Surgical History:  Procedure Laterality Date   HIP ARTHROPLASTY   06/25/2011   Procedure: ARTHROPLASTY BIPOLAR HIP;  Surgeon: Robbie Chiles [place];  Location: [facility];  Service: Orthopedics;  Laterality: Right;   NASAL SINUS SURGERY     VAGINAL HYSTERECTOMY     Family History  Problem Relation Age of Onset   Asthma Other    Colon cancer Father    Dementia Neg Hx    Social History   Socioeconomic History   Marital status: Married    Spouse name: Not on file   Number of children: 1   Years of education: 16   Highest education level: Not on file  Occupational History   Occupation: retired    Associate Professor: RETIRED  Tobacco Use   Smoking status: Never   Smokeless tobacco: Never  Vaping Use   Vaping status: Never Used  Substance and Sexual Activity   Alcohol use: Yes    Alcohol/week: 2.0 standard drinks of alcohol    Types: 2 Glasses of wine per week  Comment: 2 glasses daily   Drug use: No   Sexual activity: Not on file  Other Topics Concern   Not on file  Social History Narrative   Originally from [facility]. She previously lived in [place]. Previously was a Runner, broadcasting/film/video. No pets currently. No bird exposure. No recent mold exposure. Previous hold did have mold.       Lives at [facility]: skilled nursing area    Caffeine use: 1 cup coffee /day   Social Drivers of Corporate investment banker Strain: Low Risk  (01/05/2024)   Overall Financial Resource Strain (CARDIA)    Difficulty of Paying Living Expenses: Not hard at all  Food Insecurity: Patient Unable To Answer (01/05/2024)   Hunger Vital Sign    Worried About Running Out of Food in the Last Year: Patient unable to answer    Ran Out of Food in the Last Year: Patient unable to answer  Transportation Needs: No Transportation Needs (01/05/2024)   PRAPARE - Administrator, Civil Service (Medical): No    Lack of Transportation (Non-Medical): No  Physical Activity: Inactive (01/05/2024)   Exercise Vital Sign    Days of Exercise per Week: 0 days    Minutes of Exercise per Session: 0  min  Stress: Patient Unable To Answer (01/05/2024)   [employer] of Occupational Health - Occupational Stress Questionnaire    Feeling of Stress : Patient unable to answer  Social Connections: Socially Isolated (01/05/2024)   Social Connection and Isolation Panel [NHANES]    Frequency of Communication with Friends and Family: Never    Frequency of Social Gatherings with Friends and Family: Once a week    Attends Religious Services: Never    Database administrator or Organizations: No    Attends Banker Meetings: Never    Marital Status: Widowed    Tobacco Counseling Counseling given: Not Answered   Clinical Intake:  Pre-visit preparation completed: No  Pain : Faces Faces Pain Scale: No hurt  Faces Pain Scale: No hurt  BMI - recorded: 26.89 Nutritional Risks: None Diabetes: No  How often do you need to have someone help you when you read instructions, pamphlets, or other written materials from your doctor or pharmacy?: 5 - Always What is the last grade level you completed in school?: unable to answer  Interpreter Needed?: No      Activities of Daily Living    01/05/2024   10:43 AM  In your present state of health, do you have any difficulty performing the following activities:  Hearing? 0  Vision? 1  Comment per nursing  Difficulty concentrating or making decisions? 1  Walking or climbing stairs? 1  Dressing or bathing? 1  Doing errands, shopping? 1  Preparing Food and eating ? Y  Using the Toilet? Y  In the past six months, have you accidently leaked urine? Y  Do you have problems with loss of bowel control? Y  Managing your Medications? Y  Managing your Finances? Y  Housekeeping or managing your Housekeeping? Y    Patient Care Team: Marguerite Shiley, MD as PCP - General (Internal Medicine) Vernell Goldsmith, MD (Pulmonary Disease)  Indicate any recent Medical Services you may have received from other than [facility] providers in the past year  (date may be approximate).     Assessment:    This is a routine wellness examination for Shakti.  Hearing/Vision screen No results found.   Goals Addressed  This Visit's Progress    Social and Functional Skills Optimized   Not on track    Evidence-based guidance:  Assess level of social support; promote maintaining links with family, friends and community to reduce social isolation.  Assess level of function related to basic activities of daily living that include eating, dressing, bathing, as well as instrumental activities of daily living such as shopping, managing finances and use of devices.  Encourage continuation of daily life components such as self-care, home maintenance, financial management, volunteer activities, education opportunities and hobbies.  Refer to occupational or physical therapy to develop comprehensive rehabilitation plan to improve or maintain activities of daily living; consider inclusion of endurance, balance and resistance-training.  Consider complementary therapy such as yoga, music, gardening, outdoor activities, aromatherapy and tai chi.   Notes:        Depression Screen    01/05/2024   10:45 AM 12/08/2023   11:25 AM 08/17/2023    7:01 PM 04/28/2023    3:25 PM 02/16/2023    2:48 PM 11/27/2022   10:29 AM 08/19/2022    2:35 PM  PHQ 2/9 Scores  PHQ - 2 Score      2 0  PHQ- 9 Score      8   Exception Documentation Medical reason Medical reason Medical reason Medical reason Medical reason      Fall Risk    01/05/2024   10:46 AM 12/08/2023   11:25 AM 08/17/2023    7:01 PM 05/26/2023    8:27 PM 02/16/2023    2:48 PM  Fall Risk   Falls in the past year? 0 0 0 0 0  Number falls in past yr: 0 0 0 0 0  Injury with Fall? 0 0 0 0 0  Risk for fall due to : History of fall(s);Impaired balance/gait;Impaired mobility History of fall(s);Impaired balance/gait;Impaired mobility History of fall(s);Impaired balance/gait Impaired mobility;Impaired  balance/gait;History of fall(s) History of fall(s);Impaired balance/gait;Impaired mobility  Follow up Falls evaluation completed;Education provided Falls evaluation completed;Education provided Education provided;Falls evaluation completed Falls evaluation completed;Education provided;Falls prevention discussed Falls evaluation completed;Education provided;Falls prevention discussed    MEDICARE RISK AT HOME: Medicare Risk at Home Any stairs in or around the home?: No If so, are there any without handrails?: No Home free of loose throw rugs in walkways, pet beds, electrical cords, etc?: Yes Adequate lighting in your home to reduce risk of falls?: Yes Life alert?: No Use of a cane, walker or w/c?: Yes Grab bars in the bathroom?: Yes Shower chair or bench in shower?: Yes Elevated toilet seat or a handicapped toilet?: Yes  TIMED UP AND GO:  Was the test performed?  No    Cognitive Function:    09/26/2021    1:03 PM 09/20/2018    2:10 PM 09/14/2017    2:19 PM  MMSE - Mini Mental State Exam  Not completed: --    Orientation to time 2 1 0  Orientation to Place 4 5 5   Registration 3 3 3   Attention/ Calculation 0 0 5  Recall 0 0 2  Language- name 2 objects 2 2 2   Language- repeat 1 1 1   Language- follow 3 step command 0 3 3  Language- read & follow direction 1 1 1   Write a sentence 0 1 1  Copy design 0 0 0  Total score 13 17 23       09/08/2016   12:13 PM 03/04/2016   11:08 AM 09/04/2015   10:44 AM  Montreal Cognitive  Assessment   Visuospatial/ Executive (0/5) 4 1 1   Naming (0/3) 3 3 2   Attention: Read list of digits (0/2) 2 2 2   Attention: Read list of letters (0/1) 1 1 1   Attention: Serial 7 subtraction starting at 100 (0/3) 1 1 3   Language: Repeat phrase (0/2) 2 2 2   Language : Fluency (0/1) 1 1 1   Abstraction (0/2) 2 1 1   Delayed Recall (0/5) 0 0 0  Orientation (0/6) 3 4 4   Total 19 16 17   Adjusted Score (based on education) 19 16 18       Immunizations Immunization  History  Administered Date(s) Administered   Fluad Trivalent(High Dose 65+) 05/25/2023   Influenza Inj Mdck Quad Pf 05/18/2019, 05/07/2021   Influenza Split 04/04/2012, 05/03/2013, 05/10/2015   Influenza Whole 04/18/2008, 06/04/2009, 04/03/2010, 03/30/2011   Influenza, High Dose Seasonal PF 04/20/2018, 05/24/2020   Influenza,inj,Quad PF,6+ Mos 03/26/2017   Influenza-Unspecified 03/26/2010, 04/13/2012, 05/08/2013, 05/14/2014, 06/03/2014, 05/13/2015, 04/27/2016, 05/08/2022   Moderna Covid-19 Fall Seasonal Vaccine 41yrs & older 06/08/2022   Moderna Covid-19 Vaccine  Bivalent Booster 26yrs & up 11/28/2021, 05/25/2023   Moderna SARS-COV2 Booster Vaccination 06/13/2020, 03/25/2021, 05/14/2021   Moderna Sars-Covid-2 Vaccination 09/05/2019, 10/03/2019   Pfizer Covid-19 Vaccine Bivalent Booster 54yrs & up 11/26/2022   Pneumococcal Conjugate-13 03/05/2014   Pneumococcal Polysaccharide-23 06/04/2009   Pneumococcal-Unspecified 03/13/2009   Respiratory Syncytial Virus Vaccine,Recomb Aduvanted(Arexvy) 07/23/2022   Td 03/13/2009, 04/13/2012   Tdap 12/01/2022   Zoster Recombinant(Shingrix) 08/03/2017, 10/01/2017   Zoster, Live 03/21/2010    TDAP status: Up to date  Flu Vaccine status: Up to date  Pneumococcal vaccine status: Up to date  Covid-19 vaccine status: Completed vaccines  Qualifies for Shingles Vaccine? Yes   Zostavax completed Yes   Shingrix Completed?: Yes  Screening Tests Health Maintenance  Topic Date Due   COVID-19 Vaccine (7 - 2024-25 season) 07/20/2023   INFLUENZA VACCINE  03/03/2024   Medicare Annual Wellness (AWV)  01/04/2025   DTaP/Tdap/Td (4 - Td or Tdap) 11/30/2032   Pneumonia Vaccine 28+ Years old  Completed   Zoster Vaccines- Shingrix  Completed   HPV VACCINES  Aged Out   Meningococcal B Vaccine  Aged Out   DEXA SCAN  Discontinued    Health Maintenance  Health Maintenance Due  Topic Date Due   COVID-19 Vaccine (7 - 2024-25 season) 07/20/2023     Colorectal cancer screening: No longer required.   Mammogram status: No longer required due to advanced age.  Bone Density status: Completed no future studies per goals of care> non ambulatory> no benefit from study. Results reflect: Bone density results: OSTEOPOROSIS. Repeat every no future studies per goals of care/ advanced age years.  Lung Cancer Screening: (Low Dose CT Chest recommended if Age 89-80 years, 20 pack-year currently smoking OR have quit w/in 15years.) does not qualify.   Lung Cancer Screening Referral: No  Additional Screening:  Hepatitis C Screening: does not qualify; Completed   Vision Screening: Recommended annual ophthalmology exams for early detection of glaucoma and other disorders of the eye. Is the patient up to date with their annual eye exam?  No  Who is the provider or what is the name of the office in which the patient attends annual eye exams? Last eye exam 2021 per chart review If pt is not established with a provider, would they like to be referred to a provider to establish care? No .   Dental Screening: Recommended annual dental exams for proper oral hygiene  Diabetic Foot Exam:  Diabetic Foot Exam: Completed 01/05/2024  Community Resource Referral / Chronic Care Management: CRR required this visit?  No   CCM required this visit?  No     Plan:     I have personally reviewed and noted the following in the patient's chart:   Medical and social history Use of alcohol, tobacco or illicit drugs  Current medications and supplements including opioid prescriptions. Patient is not currently taking opioid prescriptions. Functional ability and status Nutritional status Physical activity Advanced directives List of other physicians Hospitalizations, surgeries, and ER visits in previous 12 months Vitals Screenings to include cognitive, depression, and falls Referrals and appointments  In addition, I have reviewed and discussed with patient  certain preventive protocols, quality metrics, and best practice recommendations. A written personalized care plan for preventive services as well as general preventive health recommendations were provided to patient.     Arnetha Bhat, NP   01/05/2024   After Visit Summary: (MyChart) Due to this being a telephonic visit, the after visit summary with patients personalized plan was offered to patient via MyChart   Nurse Notes: Patient with advanced Alzheimer's dementia, dependent with all ADLs including feeding, unable to answer most questions due to aphasia, last MMSE 15/30 07/2023 per [facility] chart. UTD on vaccines.

## 2024-01-10 ENCOUNTER — Non-Acute Institutional Stay (SKILLED_NURSING_FACILITY): Payer: Self-pay | Admitting: Internal Medicine

## 2024-01-10 DIAGNOSIS — I10 Essential (primary) hypertension: Secondary | ICD-10-CM

## 2024-01-10 DIAGNOSIS — J4540 Moderate persistent asthma, uncomplicated: Secondary | ICD-10-CM

## 2024-01-10 DIAGNOSIS — F458 Other somatoform disorders: Secondary | ICD-10-CM

## 2024-01-10 DIAGNOSIS — F0282 Dementia in other diseases classified elsewhere, unspecified severity, with psychotic disturbance: Secondary | ICD-10-CM

## 2024-01-10 DIAGNOSIS — K219 Gastro-esophageal reflux disease without esophagitis: Secondary | ICD-10-CM

## 2024-01-10 DIAGNOSIS — N1831 Chronic kidney disease, stage 3a: Secondary | ICD-10-CM

## 2024-01-10 DIAGNOSIS — F339 Major depressive disorder, recurrent, unspecified: Secondary | ICD-10-CM

## 2024-01-10 DIAGNOSIS — G309 Alzheimer's disease, unspecified: Secondary | ICD-10-CM | POA: Diagnosis not present

## 2024-01-16 ENCOUNTER — Encounter: Payer: Self-pay | Admitting: Internal Medicine

## 2024-01-16 NOTE — Progress Notes (Unsigned)
 Location:  Medical illustrator of Service:  SNF ([id number])  Provider:   Code Status: DNR /Comfort care Goals of Care:     12/07/2023    2:22 PM  Advanced Directives  Does Patient Have a Medical Advance Directive? Yes  Type of Estate agent of [place];Living will;Out of facility DNR (pink MOST or yellow form)  Does patient want to make changes to medical advance directive? No - Patient declined  Copy of Healthcare Power of Attorney in Chart? Yes - validated most recent copy scanned in chart (See row information)  Pre-existing out of facility DNR order (yellow form or pink MOST form) Pink MOST/Yellow Form most recent copy in chart - Physician notified to receive inpatient order     Chief Complaint  Patient presents with   Care Management    HPI: Patient is a 89 y.o. female seen today for medical management of chronic diseases.   Lives in SNF in [place]   Patient has a history of Alzheimer's dementia dependent for her ADLs Recent Decline with behaviors Vistaril  seems to be helping with behaviors. She is now Comfort Care Osteoporosis and knee osteoarthritis CKD stage 3b Severe asthma Eosinophilic Sees Pulmonary stable in Dupixent  HLD and hypertension    Patient has h/o Teeth Grinding Mouth Guard seems to help No Other issues Pleasantly  confused Needs help with feeding and is Alen Amy dependent Wt Readings from Last 3 Encounters:  01/05/24 147 lb (66.7 kg)  12/07/23 147 lb (66.7 kg)  11/09/23 167 lb 6.4 oz (75.9 kg)     Past Medical History:  Diagnosis Date   Age-related osteoporosis without current pathological fracture    Alzheimer's disease (HCC)    with late onset   Arthritis    Asthma    Deficiency of other specified B group vitamins    Depression    Environmental allergies    mold   Fall    GERD without esophagitis    Hyperlipidemia    Hypertension    Hypertensive kidney disease with CKD (chronic kidney disease)    with  stage I through stage 4 CKD    Memory loss    Seasonal allergies    Unspecified osteoarthritis, unspecified site     Past Surgical History:  Procedure Laterality Date   HIP ARTHROPLASTY  06/25/2011   Procedure: ARTHROPLASTY BIPOLAR HIP;  Surgeon: Robbie Chiles Aplington;  Location: [facility];  Service: Orthopedics;  Laterality: Right;   NASAL SINUS SURGERY     VAGINAL HYSTERECTOMY      Allergies  Allergen Reactions   Azithromycin  Other (See Comments)    Nausea, weakness    Aspirin Other (See Comments)    Unknown reaction   Erythromycin    Molds & Smuts    Other     Seasonal Allergies.   Septra [Sulfamethoxazole-Trimethoprim]    Sulfa Antibiotics    Sulfonamide Derivatives Other (See Comments)    Unknown reaction    Outpatient Encounter Medications as of 01/10/2024  Medication Sig   acetaminophen  (TYLENOL ) 500 MG tablet Take 500 mg by mouth 3 (three) times daily.   benzonatate  (TESSALON ) 200 MG capsule Take 1 capsule (200 mg total) by mouth 3 (three) times daily as needed for cough.   budesonide  (PULMICORT ) 0.5 MG/2ML nebulizer solution Take 0.5 mg by nebulization 2 (two) times daily.   Calcium Carb-Cholecalciferol (CALCIUM 600-D PO) Take 1 tablet by mouth daily.   Cholecalciferol (VITAMIN D) 2000 units tablet Take 2,000 Units by  mouth at bedtime.   dextromethorphan (DELSYM) 30 MG/5ML liquid Take 60 mg by mouth every 12 (twelve) hours as needed for cough.   dupilumab  (DUPIXENT ) 300 MG/2ML prefilled syringe Inject 300 mg into the skin every 14 (fourteen) days.   FLUoxetine (PROZAC) 40 MG capsule Take 40 mg by mouth daily.   fluticasone  (FLONASE ) 50 MCG/ACT nasal spray Place 2 sprays into both nostrils daily. 8 am - 11 am   formoterol  (PERFOROMIST ) 20 MCG/2ML nebulizer solution Take 20 mcg by nebulization 2 (two) times daily.   furosemide (LASIX) 20 MG tablet Take 20 mg by mouth daily. Hold for SBP<120   hydrOXYzine  (ATARAX ) 25 MG tablet Take 25 mg by mouth 3 (three) times daily as  needed for anxiety.   ipratropium-albuterol  (DUONEB) 0.5-2.5 (3) MG/3ML SOLN Take 3 mLs by nebulization every 6 (six) hours as needed.   memantine  (NAMENDA ) 10 MG tablet Take 1 tablet (10 mg total) by mouth 2 (two) times daily.   montelukast  (SINGULAIR ) 10 MG tablet Take 1 tablet (10 mg total) by mouth daily.   nystatin (MYCOSTATIN/NYSTOP) powder Apply 1 Application topically every morning.   omeprazole (PRILOSEC) 20 MG capsule Take 20 mg by mouth every morning. 30 minutes before breakfast.   omeprazole (PRILOSEC) 20 MG capsule Take 20 mg by mouth every evening. 30 minutes before dinner.   Polyethyl Glycol-Propyl Glycol (SYSTANE) 0.4-0.3 % GEL ophthalmic gel Place 1 application into both eyes at bedtime.   polyethylene glycol (MIRALAX  / GLYCOLAX ) packet Take 17 g by mouth every 3 (three) days.   QUEtiapine  (SEROQUEL ) 25 MG tablet Take 25 mg by mouth at bedtime.   vitamin B-12 (CYANOCOBALAMIN) 1000 MCG tablet Take 3,000 mcg by mouth daily.   No facility-administered encounter medications on file as of 01/10/2024.    Review of Systems:  Review of Systems  Unable to perform ROS: Dementia    Health Maintenance  Topic Date Due   COVID-19 Vaccine (7 - 2024-25 season) 07/20/2023   INFLUENZA VACCINE  03/03/2024   Medicare Annual Wellness (AWV)  01/04/2025   DTaP/Tdap/Td (4 - Td or Tdap) 11/30/2032   Pneumococcal Vaccine: 50+ Years  Completed   Zoster Vaccines- Shingrix  Completed   HPV VACCINES  Aged Out   Meningococcal B Vaccine  Aged Out   DEXA SCAN  Discontinued    Physical Exam: Vitals:   01/10/24 1751  BP: (!) 119/52  Pulse: 66  Resp: 18  Temp: (!) 97.5 F (36.4 C)   There is no height or weight on file to calculate BMI. Physical Exam Vitals reviewed.  Constitutional:      Appearance: Normal appearance.  HENT:     Head: Normocephalic.     Nose: Nose normal.     Mouth/Throat:     Mouth: Mucous membranes are moist.     Pharynx: Oropharynx is clear.   Eyes:     Pupils:  Pupils are equal, round, and reactive to light.    Cardiovascular:     Rate and Rhythm: Normal rate and regular rhythm.     Pulses: Normal pulses.     Heart sounds: Normal heart sounds. No murmur heard. Pulmonary:     Effort: Pulmonary effort is normal.     Breath sounds: Normal breath sounds.  Abdominal:     General: Abdomen is flat. Bowel sounds are normal.     Palpations: Abdomen is soft.   Musculoskeletal:        General: No swelling.     Cervical back:  Neck supple.   Skin:    General: Skin is warm.   Neurological:     General: No focal deficit present.     Mental Status: She is alert.   Psychiatric:        Mood and Affect: Mood normal.        Thought Content: Thought content normal.     Labs reviewed: Basic Metabolic Panel: Recent Labs    03/15/23 0000 09/17/23 0000  NA 139 137  K 4.5 4.7  CL 103 103  CO2 22 22  BUN 21 27*  CREATININE 0.9 1.0  CALCIUM 9.3 9.3  TSH  --  3.24   Liver Function Tests: Recent Labs    03/15/23 0000 09/17/23 0000  AST 17 17  ALT 12 12  ALKPHOS 113 89  ALBUMIN 3.6 3.5   No results for input(s): LIPASE, AMYLASE in the last 8760 hours. No results for input(s): AMMONIA in the last 8760 hours. CBC: No results for input(s): WBC, NEUTROABS, HGB, HCT, MCV, PLT in the last 8760 hours. Lipid Panel: No results for input(s): CHOL, HDL, LDLCALC, TRIG, CHOLHDL, LDLDIRECT in the last 8760 hours. No results found for: HGBA1C  Procedures since last visit: No results found.  Assessment/Plan 1. Dementia in Alzheimer's disease with delusions (HCC) (Primary) Had lost some weight but goals are mostly Co=mfort Needs help with Feeding Total Dependent for her ADLS Non Ambulatory Namenda  and atarax  for behaviors 2. Essential hypertension No MEds Low dose of Lasix 3. Stage 3a chronic kidney disease (HCC) Stable  4. Moderate persistent asthma without complication Perforomist  and Dupixent   5.  Bruxism Mouth Guard  6. Gastroesophageal reflux disease without esophagitis Prilosec  7. Recurrent depression (HCC) Prozac 8 Anemia Hgb 10.9  No Work up due to her Age Continue Monitor    Labs/tests ordered:  * No order type specified * Next appt:  Visit date not found

## 2024-02-03 ENCOUNTER — Non-Acute Institutional Stay (SKILLED_NURSING_FACILITY): Payer: Self-pay | Admitting: Adult Health

## 2024-02-03 ENCOUNTER — Encounter: Payer: Self-pay | Admitting: Adult Health

## 2024-02-03 DIAGNOSIS — I10 Essential (primary) hypertension: Secondary | ICD-10-CM

## 2024-02-03 DIAGNOSIS — F325 Major depressive disorder, single episode, in full remission: Secondary | ICD-10-CM

## 2024-02-03 DIAGNOSIS — J4550 Severe persistent asthma, uncomplicated: Secondary | ICD-10-CM

## 2024-02-03 DIAGNOSIS — R600 Localized edema: Secondary | ICD-10-CM

## 2024-02-03 DIAGNOSIS — K219 Gastro-esophageal reflux disease without esophagitis: Secondary | ICD-10-CM

## 2024-02-03 DIAGNOSIS — G301 Alzheimer's disease with late onset: Secondary | ICD-10-CM | POA: Diagnosis not present

## 2024-02-03 DIAGNOSIS — F02818 Dementia in other diseases classified elsewhere, unspecified severity, with other behavioral disturbance: Secondary | ICD-10-CM

## 2024-02-03 DIAGNOSIS — E559 Vitamin D deficiency, unspecified: Secondary | ICD-10-CM

## 2024-02-03 NOTE — Progress Notes (Signed)
 Location:  [facility] Room Number: 121 P Place of Service:  SNF [phone]) Provider:  Tawni America, NP    Patient Care Team: [name], MD as PCP - General (Internal Medicine) Brien Belvie BRAVO, MD (Pulmonary Disease)  Extended Emergency Contact Information Primary Emergency Contact: [place],Suzanne  [place] Home Phone: [phone] Mobile Phone: [phone] Relation: Daughter  Code Status:  DNR Goals of care: Advanced Directive information    12/07/2023    2:22 PM  Advanced Directives  Does Patient Have a Medical Advance Directive? Yes  Type of Estate agent of [place];Living will;Out of facility DNR (pink MOST or yellow form)  Does patient want to make changes to medical advance directive? No - Patient declined  Copy of Healthcare Power of Attorney in Chart? Yes - validated most recent copy scanned in chart (See row information)  Pre-existing out of facility DNR order (yellow form or pink MOST form) Pink MOST/Yellow Form most recent copy in chart - Physician notified to receive inpatient order     Chief Complaint  Patient presents with   Routine Visit    HPI:  Pt is a 89 y.o. female seen today for medical management of chronic diseases.    PMH significant for asthma, OP, arthritis, Vit d deficiency, depression, dementia, HTN, HLD, CKD hyperlipidemia, seasonal allergies, OA, and GERD.   No new complaints per nursing staff. There were some concerns about weight loss but the re weight verified she is stable.   Dementia: needs assistance with all ADLs, hoyer lift for transfers, incontinence. Uses prn vistaril  when agitated in the afternoon. Remains pleasant and verbal. Sleeps in and can be hard to arouse in the am. Not able to perform an MMSE  SCC and AK to left lower ext. Followed by dermatology.   Asthma: no new exacerbations. Followed by pulmonary has been doing well on Dupixent  and  pulmicort   HTN Controlled  GERD no issues on prilosec  Depression: on prozac for years. No verbalization or sign of depression. Becomes anxious in the afternoon evening hours. On vistaril  prn. Behaviors controlled when staff questioned.  Does have bruxism.    Bp runs on the soft side On lasix daily for edema. No echo to review.    Past Medical History:  Diagnosis Date   Age-related osteoporosis without current pathological fracture    Alzheimer's disease (HCC)    with late onset   Arthritis    Asthma    Deficiency of other specified B group vitamins    Depression    Environmental allergies    mold   Fall    GERD without esophagitis    Hyperlipidemia    Hypertension    Hypertensive kidney disease with CKD (chronic kidney disease)    with stage I through stage 4 CKD    Memory loss    Seasonal allergies    Unspecified osteoarthritis, unspecified site    Past Surgical History:  Procedure Laterality Date   HIP ARTHROPLASTY  06/25/2011   Procedure: ARTHROPLASTY BIPOLAR HIP;  Surgeon: Lynwood SQUIBB Aplington;  Location: [facility];  Service: Orthopedics;  Laterality: Right;   NASAL SINUS SURGERY     VAGINAL HYSTERECTOMY      Allergies  Allergen Reactions   Azithromycin  Other (See Comments)    Nausea, weakness    Aspirin Other (See Comments)    Unknown reaction   Erythromycin    Molds & Smuts    Other     Seasonal  Allergies.   Septra [Sulfamethoxazole-Trimethoprim]    Sulfa Antibiotics    Sulfonamide Derivatives Other (See Comments)    Unknown reaction    Outpatient Encounter Medications as of 02/03/2024  Medication Sig   acetaminophen  (TYLENOL ) 500 MG tablet Take 500 mg by mouth 3 (three) times daily.   benzonatate  (TESSALON ) 200 MG capsule Take 1 capsule (200 mg total) by mouth 3 (three) times daily as needed for cough.   budesonide  (PULMICORT ) 0.5 MG/2ML nebulizer solution Take 0.5 mg by nebulization 2 (two) times daily.   Calcium Carb-Cholecalciferol (CALCIUM 600-D PO)  Take 1 tablet by mouth daily.   chlorhexidine (PERIDEX) 0.12 % solution Use as directed 15 mLs in the mouth or throat 2 (two) times daily.   Cholecalciferol (VITAMIN D) 2000 units tablet Take 2,000 Units by mouth at bedtime.   dextromethorphan (DELSYM) 30 MG/5ML liquid Take 60 mg by mouth every 12 (twelve) hours as needed for cough.   dupilumab  (DUPIXENT ) 300 MG/2ML prefilled syringe Inject 300 mg into the skin every 14 (fourteen) days.   Emollient (CETAPHIL) cream Apply topically 2 (two) times daily.   EPINEPHrine  (ADRENALIN ) 1 MG/ML SOLN injection Inject 0.3 mg into the muscle as needed for anaphylaxis.   FLUoxetine (PROZAC) 40 MG capsule Take 40 mg by mouth daily.   fluticasone  (FLONASE ) 50 MCG/ACT nasal spray Place 2 sprays into both nostrils daily. 8 am - 11 am   formoterol  (PERFOROMIST ) 20 MCG/2ML nebulizer solution Take 20 mcg by nebulization 2 (two) times daily.   furosemide (LASIX) 20 MG tablet Take 20 mg by mouth daily. Hold for SBP<120   hydrOXYzine  (ATARAX ) 25 MG tablet Take 25 mg by mouth 3 (three) times daily as needed for anxiety.   ipratropium-albuterol  (DUONEB) 0.5-2.5 (3) MG/3ML SOLN Take 3 mLs by nebulization every 6 (six) hours as needed.   memantine  (NAMENDA ) 10 MG tablet Take 1 tablet (10 mg total) by mouth 2 (two) times daily.   mineral oil-hydrophilic petrolatum (AQUAPHOR) ointment Apply topically as needed for dry skin.   montelukast  (SINGULAIR ) 10 MG tablet Take 1 tablet (10 mg total) by mouth daily.   nystatin (MYCOSTATIN/NYSTOP) powder Apply 1 Application topically every morning.   omeprazole (PRILOSEC) 20 MG capsule Take 20 mg by mouth every morning. 30 minutes before breakfast.   omeprazole (PRILOSEC) 20 MG capsule Take 20 mg by mouth every evening. 30 minutes before dinner.   Polyethyl Glycol-Propyl Glycol (SYSTANE) 0.4-0.3 % GEL ophthalmic gel Place 1 application into both eyes at bedtime.   polyethylene glycol (MIRALAX  / GLYCOLAX ) packet Take 17 g by mouth every 3  (three) days.   QUEtiapine  (SEROQUEL ) 25 MG tablet Take 25 mg by mouth at bedtime.   UNABLE TO FIND JOBST HOSE KN 15-20 SM BG   vitamin B-12 (CYANOCOBALAMIN) 1000 MCG tablet Take 3,000 mcg by mouth daily.   [DISCONTINUED] rivaroxaban  (XARELTO ) 10 MG TABS tablet Take 1 tablet (10 mg total) by mouth daily.   No facility-administered encounter medications on file as of 02/03/2024.    Review of Systems  Unable to perform ROS: Dementia    Immunization History  Administered Date(s) Administered   Fluad Trivalent(High Dose 65+) 05/25/2023   Influenza Inj Mdck Quad Pf 05/18/2019, 05/07/2021   Influenza Split 04/04/2012, 05/03/2013, 05/10/2015   Influenza Whole 04/18/2008, 06/04/2009, 04/03/2010, 03/30/2011   Influenza, High Dose Seasonal PF 04/20/2018, 05/24/2020   Influenza,inj,Quad PF,6+ Mos 03/26/2017   Influenza-Unspecified 03/26/2010, 04/13/2012, 05/08/2013, 05/14/2014, 06/03/2014, 05/13/2015, 04/27/2016, 05/08/2022   Moderna Covid-19 Fall Seasonal Vaccine 86yrs &  older 06/08/2022   Moderna Covid-19 Vaccine  Bivalent Booster 61yrs & up 11/28/2021, 05/25/2023   Moderna SARS-COV2 Booster Vaccination 06/13/2020, 03/25/2021, 05/14/2021   Moderna Sars-Covid-2 Vaccination 09/05/2019, 10/03/2019   Pfizer Covid-19 Vaccine Bivalent Booster 52yrs & up 11/26/2022   Pneumococcal Conjugate-13 03/05/2014   Pneumococcal Polysaccharide-23 06/04/2009   Pneumococcal-Unspecified 03/13/2009   Respiratory Syncytial Virus Vaccine,Recomb Aduvanted(Arexvy) 07/23/2022   Td 03/13/2009, 04/13/2012   Tdap 12/01/2022   Zoster Recombinant(Shingrix) 08/03/2017, 10/01/2017   Zoster, Live 03/21/2010   Pertinent  Health Maintenance Due  Topic Date Due   INFLUENZA VACCINE  03/03/2024   DEXA SCAN  Discontinued      02/16/2023    2:48 PM 05/26/2023    8:27 PM 08/17/2023    7:01 PM 12/08/2023   11:25 AM 01/05/2024   10:46 AM  Fall Risk  Falls in the past year? 0 0 0 0 0  Was there an injury with Fall? 0 0 0 0 0   Fall Risk Category Calculator 0 0 0 0 0  Patient at Risk for Falls Due to History of fall(s);Impaired balance/gait;Impaired mobility Impaired mobility;Impaired balance/gait;History of fall(s) History of fall(s);Impaired balance/gait History of fall(s);Impaired balance/gait;Impaired mobility History of fall(s);Impaired balance/gait;Impaired mobility  Fall risk Follow up Falls evaluation completed;Education provided;Falls prevention discussed Falls evaluation completed;Education provided;Falls prevention discussed Education provided;Falls evaluation completed Falls evaluation completed;Education provided Falls evaluation completed;Education provided   Functional Status Survey:    Vitals:   02/03/24 1405  BP: 92/60  Pulse: 78  Resp: 18  Temp: (!) 97.2 F (36.2 C)  SpO2: 96%  Weight: 168 lb 3.2 oz (76.3 kg)  Height: 5' 2 (1.575 m)   Physical Exam Constitutional:      General: She is not in acute distress.    Appearance: She is not diaphoretic.  HENT:     Head: Normocephalic and atraumatic.  Neck:     Vascular: No JVD.  Cardiovascular:     Rate and Rhythm: Normal rate and regular rhythm.     Heart sounds: No murmur heard. Pulmonary:     Effort: Pulmonary effort is normal. No respiratory distress.     Breath sounds: Normal breath sounds. No wheezing.  Abdominal:     General: Bowel sounds are normal. There is no distension.     Palpations: Abdomen is soft.     Tenderness: There is no abdominal tenderness.  Skin:    General: Skin is warm and dry.  Neurological:     Mental Status: She is alert. Mental status is at baseline.  Psychiatric:        Mood and Affect: Mood normal.     Labs reviewed: Recent Labs    03/15/23 0000 09/17/23 0000  NA 139 137  K 4.5 4.7  CL 103 103  CO2 22 22  BUN 21 27*  CREATININE 0.9 1.0  CALCIUM 9.3 9.3   Recent Labs    03/15/23 0000 09/17/23 0000  AST 17 17  ALT 12 12  ALKPHOS 113 89  ALBUMIN 3.6 3.5   No results for input(s):  WBC, NEUTROABS, HGB, HCT, MCV, PLT in the last 8760 hours. Lab Results  Component Value Date   TSH 3.24 09/17/2023   No results found for: HGBA1C No results found for: CHOL, HDL, LDLCALC, LDLDIRECT, TRIG, CHOLHDL  Significant Diagnostic Results in last 30 days:  No results found.  Assessment/Plan  1. Essential hypertension Controlled On lasix since she is not losing weight as previously thought and stable will leave the same.  2. Severe persistent asthma without complication Controlled  Continue Dupixent , Pulmicort , and Performorist per pulmonary   3. Gastro-esophageal reflux disease without esophagitis Continue Prilosec   4. Dementia of the Alzheimer's type, with late onset, with delusions (HCC) Progressive decline in cognition and physical function c/w the disease. Continue supportive care in the skilled environment. Continue Namenda , Vistaril  and Seroquel   5. Stage 3b chronic kidney disease (HCC) Continue to periodically monitor BMP and avoid nephrotoxic agents  6. Major depressive disorder, single episode, in remission (HCC) Long term use of prozac Has periods of agitation, would not taper   7. Vit D def On supplement Prior hx of OP but due to goals of care and non ambulatory status no further treatment is warranted.       Labs/tests ordered:  due in Aug

## 2024-02-05 ENCOUNTER — Encounter: Payer: Self-pay | Admitting: Adult Health

## 2024-02-15 ENCOUNTER — Encounter: Payer: Self-pay | Admitting: Pulmonary Disease

## 2024-02-15 ENCOUNTER — Ambulatory Visit (INDEPENDENT_AMBULATORY_CARE_PROVIDER_SITE_OTHER): Admitting: Pulmonary Disease

## 2024-02-15 VITALS — BP 135/62 | HR 72 | Wt 165.0 lb

## 2024-02-15 DIAGNOSIS — J4550 Severe persistent asthma, uncomplicated: Secondary | ICD-10-CM

## 2024-02-15 DIAGNOSIS — J3089 Other allergic rhinitis: Secondary | ICD-10-CM

## 2024-02-15 DIAGNOSIS — J8283 Eosinophilic asthma: Secondary | ICD-10-CM

## 2024-02-15 MED ORDER — IPRATROPIUM-ALBUTEROL 0.5-2.5 (3) MG/3ML IN SOLN: 3.0000 mL | Freq: Four times a day (QID) | RESPIRATORY_TRACT | 3 refills | Status: AC | PRN

## 2024-02-15 MED ORDER — FORMOTEROL FUMARATE 20 MCG/2ML IN NEBU: 20.0000 ug | INHALATION_SOLUTION | Freq: Two times a day (BID) | RESPIRATORY_TRACT | 12 refills | Status: AC

## 2024-02-15 MED ORDER — BUDESONIDE 0.5 MG/2ML IN SUSP: 0.5000 mg | Freq: Two times a day (BID) | RESPIRATORY_TRACT | 12 refills | Status: AC

## 2024-02-15 MED ORDER — MONTELUKAST SODIUM 10 MG PO TABS: 10.0000 mg | ORAL_TABLET | Freq: Every day | ORAL | 12 refills | Status: AC

## 2024-02-15 NOTE — Patient Instructions (Signed)
 Nice to see you again  No changes to medication  I have printed refills today  Please continue Dupixent , it looks like someone at your facility is refilling this which is fine.  If I need to please let me know.  Return to clinic in 1 year or sooner as needed with Dr. Annella

## 2024-02-15 NOTE — Progress Notes (Signed)
 @Patient  ID: [name], female    DOB: 04-03-1924, 89 y.o.   MRN: [id number]  Chief Complaint  Patient presents with   Follow-up    Referring provider: [name], MD  HPI:   89 y.o. woman with asthma here for follow up of the same.  Most recent PCP note x2 reviewed.  Symptoms well controlled. On nebulized ICS/LABA therapy as well as Dupixent .  Baseline dyspnea but largely unchanged.  Breathing seems stable.  In wheelchair today which is her norm.  No exacerbations or hospitalizations related to breathing in the interim.  Overall well-controlled.  PMH: asthma Surgical History: Hip surgery, nasal sinus surgery, hysterectomy Family History: father with colon cancer, no significant respiratory disease in first degree relative  Questionaires / Pulmonary Flowsheets:   ACT:  Asthma Control Test ACT Total Score  03/11/2021 11:35 AM 19    MMRC:     No data to display          Epworth:      No data to display          Tests:   FENO:  No results found for: NITRICOXIDE  PFT:     No data to display          WALK:      No data to display          Imaging: Personally reviewed  and as per EMR and discussion in this note  Lab Results: Personally reviewed, stable anemia noted CBC    Component Value Date/Time   WBC 5.5 08/20/2022 0000   WBC 7.2 02/02/2018 1031   RBC 3.63 (A) 08/20/2022 0000   HGB 11.1 (A) 08/20/2022 0000   HCT 33 (A) 08/20/2022 0000   PLT 186 08/20/2022 0000   MCV 89.8 02/02/2018 1031   MCH 30.3 07/24/2017 1822   MCHC 34.4 02/02/2018 1031   RDW 15.3 02/02/2018 1031   LYMPHSABS 0.9 02/02/2018 1031   MONOABS 0.5 02/02/2018 1031   EOSABS 0.7 02/02/2018 1031   BASOSABS 0.0 02/02/2018 1031    BMET    Component Value Date/Time   NA 137 09/17/2023 0000   K 4.7 09/17/2023 0000   CL 103 09/17/2023 0000   CO2 22 09/17/2023 0000   GLUCOSE 90 02/02/2018 1031   BUN 27 (A) 09/17/2023 0000   CREATININE 1.0 09/17/2023 0000    CREATININE 1.14 02/02/2018 1031   CALCIUM 9.3 09/17/2023 0000   GFRNONAA 59 (L) 07/24/2017 1822   GFRAA >60 07/24/2017 1822    BNP No results found for: BNP  ProBNP    Component Value Date/Time   PROBNP 37.0 02/02/2018 1031    Specialty Problems       Pulmonary Problems   Allergic rhinitis   Qualifier: Diagnosis of  By: Brien MD, Belvie BRAVO       Moderate persistent asthma          Vocal cord dysfunction   Severe persistent asthma without complication    Allergies  Allergen Reactions   Azithromycin  Other (See Comments)    Nausea, weakness    Aspirin Other (See Comments)    Unknown reaction   Erythromycin    Molds & Smuts    Other     Seasonal Allergies.   Septra [Sulfamethoxazole-Trimethoprim]    Sulfa Antibiotics    Sulfonamide Derivatives Other (See Comments)    Unknown reaction    Immunization History  Administered Date(s) Administered   Fluad Trivalent(High Dose 65+) 05/25/2023   Influenza  Inj Mdck Quad Pf 05/18/2019, 05/07/2021   Influenza Split 04/04/2012, 05/03/2013, 05/10/2015   Influenza Whole 04/18/2008, 06/04/2009, 04/03/2010, 03/30/2011   Influenza, High Dose Seasonal PF 04/20/2018, 05/24/2020   Influenza,inj,Quad PF,6+ Mos 03/26/2017   Influenza-Unspecified 03/26/2010, 04/13/2012, 05/08/2013, 05/14/2014, 06/03/2014, 05/13/2015, 04/27/2016, 05/08/2022   Moderna Covid-19 Fall Seasonal Vaccine 62yrs & older 06/08/2022   Moderna Covid-19 Vaccine  Bivalent Booster 61yrs & up 11/28/2021, 05/25/2023   Moderna SARS-COV2 Booster Vaccination 06/13/2020, 03/25/2021, 05/14/2021   Moderna Sars-Covid-2 Vaccination 09/05/2019, 10/03/2019   Pfizer Covid-19 Vaccine Bivalent Booster 85yrs & up 11/26/2022   Pneumococcal Conjugate-13 03/05/2014   Pneumococcal Polysaccharide-23 06/04/2009   Pneumococcal-Unspecified 03/13/2009   Respiratory Syncytial Virus Vaccine,Recomb Aduvanted(Arexvy) 07/23/2022   Td 03/13/2009, 04/13/2012   Tdap 12/01/2022   Zoster  Recombinant(Shingrix) 08/03/2017, 10/01/2017   Zoster, Live 03/21/2010    Past Medical History:  Diagnosis Date   Age-related osteoporosis without current pathological fracture    Alzheimer's disease (HCC)    with late onset   Arthritis    Asthma    Deficiency of other specified B group vitamins    Depression    Environmental allergies    mold   Fall    GERD without esophagitis    Hyperlipidemia    Hypertension    Hypertensive kidney disease with CKD (chronic kidney disease)    with stage I through stage 4 CKD    Memory loss    Seasonal allergies    Unspecified osteoarthritis, unspecified site     Tobacco History: Social History   Tobacco Use  Smoking Status Never  Smokeless Tobacco Never   Counseling given: Not Answered   Continue to not smoke  Outpatient Encounter Medications as of 02/15/2024  Medication Sig   acetaminophen  (TYLENOL ) 500 MG tablet Take 500 mg by mouth 3 (three) times daily.   benzonatate  (TESSALON ) 200 MG capsule Take 1 capsule (200 mg total) by mouth 3 (three) times daily as needed for cough.   budesonide  (PULMICORT ) 0.5 MG/2ML nebulizer solution Take 2 mLs (0.5 mg total) by nebulization 2 (two) times daily.   Calcium Carb-Cholecalciferol (CALCIUM 600-D PO) Take 1 tablet by mouth daily.   chlorhexidine (PERIDEX) 0.12 % solution Use as directed 15 mLs in the mouth or throat 2 (two) times daily.   Cholecalciferol (VITAMIN D) 2000 units tablet Take 2,000 Units by mouth at bedtime.   dextromethorphan (DELSYM) 30 MG/5ML liquid Take 60 mg by mouth every 12 (twelve) hours as needed for cough.   dupilumab  (DUPIXENT ) 300 MG/2ML prefilled syringe Inject 300 mg into the skin every 14 (fourteen) days.   Emollient (CETAPHIL) cream Apply topically 2 (two) times daily.   EPINEPHrine  (ADRENALIN ) 1 MG/ML SOLN injection Inject 0.3 mg into the muscle as needed for anaphylaxis.   FLUoxetine (PROZAC) 40 MG capsule Take 40 mg by mouth daily.   fluticasone  (FLONASE ) 50  MCG/ACT nasal spray Place 2 sprays into both nostrils daily. 8 am - 11 am   formoterol  (PERFOROMIST ) 20 MCG/2ML nebulizer solution Take 2 mLs (20 mcg total) by nebulization 2 (two) times daily.   furosemide (LASIX) 20 MG tablet Take 20 mg by mouth daily. Hold for SBP<120   hydrOXYzine  (ATARAX ) 25 MG tablet Take 25 mg by mouth 3 (three) times daily as needed for anxiety.   ipratropium-albuterol  (DUONEB) 0.5-2.5 (3) MG/3ML SOLN Take 3 mLs by nebulization every 6 (six) hours as needed.   memantine  (NAMENDA ) 10 MG tablet Take 1 tablet (10 mg total) by mouth 2 (two) times daily.  mineral oil-hydrophilic petrolatum (AQUAPHOR) ointment Apply topically as needed for dry skin.   montelukast  (SINGULAIR ) 10 MG tablet Take 1 tablet (10 mg total) by mouth daily.   nystatin (MYCOSTATIN/NYSTOP) powder Apply 1 Application topically every morning.   omeprazole (PRILOSEC) 20 MG capsule Take 20 mg by mouth every morning. 30 minutes before breakfast.   omeprazole (PRILOSEC) 20 MG capsule Take 20 mg by mouth every evening. 30 minutes before dinner.   Polyethyl Glycol-Propyl Glycol (SYSTANE) 0.4-0.3 % GEL ophthalmic gel Place 1 application into both eyes at bedtime.   polyethylene glycol (MIRALAX  / GLYCOLAX ) packet Take 17 g by mouth every 3 (three) days.   QUEtiapine  (SEROQUEL ) 25 MG tablet Take 25 mg by mouth at bedtime.   UNABLE TO FIND JOBST HOSE KN 15-20 SM BG   vitamin B-12 (CYANOCOBALAMIN) 1000 MCG tablet Take 3,000 mcg by mouth daily.   [DISCONTINUED] budesonide  (PULMICORT ) 0.5 MG/2ML nebulizer solution Take 0.5 mg by nebulization 2 (two) times daily.   [DISCONTINUED] formoterol  (PERFOROMIST ) 20 MCG/2ML nebulizer solution Take 20 mcg by nebulization 2 (two) times daily.   [DISCONTINUED] ipratropium-albuterol  (DUONEB) 0.5-2.5 (3) MG/3ML SOLN Take 3 mLs by nebulization every 6 (six) hours as needed.   [DISCONTINUED] montelukast  (SINGULAIR ) 10 MG tablet Take 1 tablet (10 mg total) by mouth daily.    [DISCONTINUED] rivaroxaban  (XARELTO ) 10 MG TABS tablet Take 1 tablet (10 mg total) by mouth daily.   No facility-administered encounter medications on file as of 02/15/2024.     Review of Systems  Review of Systems  N/a Physical Exam  BP 135/62 (BP Location: Right Arm, Patient Position: Sitting, Cuff Size: Large)   Pulse 72   Wt 165 lb (74.8 kg)   SpO2 95%   BMI 30.18 kg/m   Wt Readings from Last 5 Encounters:  02/15/24 165 lb (74.8 kg)  02/03/24 168 lb 3.2 oz (76.3 kg)  01/05/24 147 lb (66.7 kg)  12/07/23 147 lb (66.7 kg)  11/09/23 167 lb 6.4 oz (75.9 kg)    BMI Readings from Last 5 Encounters:  02/15/24 30.18 kg/m  02/03/24 30.76 kg/m  01/05/24 26.89 kg/m  12/07/23 26.89 kg/m  11/09/23 30.62 kg/m     Physical Exam General: Sitting in wheelchair, no acute distress Eyes: EOMI, no icterus Neck: Supple, no JVD appreciated Cardiovascular: Regular rate and rhythm, no murmur Pulmonary: Normal work of breathing, clear anteriorly MSK: No synovitis, no joint effusion Neuro: In wheelchair, no weakness, sensation intact Psych: Normal mood, full affect   Assessment & Plan:   Eosinophilic asthma: On Dupixent  with much improved symptom control and exacerbation control.   To continue Dupixent , nebulized ICS/LABA as well as DuoNebs as needed. Refills sent.   Nasal allergies: s/p surgery in past. Continue montelukast  and flonase .   Return in about 1 year (around 02/14/2025) for f/u Dr. Annella.   Julia JONELLE Annella, MD 02/15/2024

## 2024-03-07 ENCOUNTER — Non-Acute Institutional Stay (SKILLED_NURSING_FACILITY): Payer: Self-pay | Admitting: Orthopedic Surgery

## 2024-03-07 ENCOUNTER — Encounter: Payer: Self-pay | Admitting: Orthopedic Surgery

## 2024-03-07 DIAGNOSIS — G309 Alzheimer's disease, unspecified: Secondary | ICD-10-CM | POA: Diagnosis not present

## 2024-03-07 DIAGNOSIS — S81812A Laceration without foreign body, left lower leg, initial encounter: Secondary | ICD-10-CM | POA: Diagnosis not present

## 2024-03-07 DIAGNOSIS — I10 Essential (primary) hypertension: Secondary | ICD-10-CM

## 2024-03-07 DIAGNOSIS — K219 Gastro-esophageal reflux disease without esophagitis: Secondary | ICD-10-CM

## 2024-03-07 DIAGNOSIS — F339 Major depressive disorder, recurrent, unspecified: Secondary | ICD-10-CM

## 2024-03-07 DIAGNOSIS — F0282 Dementia in other diseases classified elsewhere, unspecified severity, with psychotic disturbance: Secondary | ICD-10-CM

## 2024-03-07 DIAGNOSIS — N1831 Chronic kidney disease, stage 3a: Secondary | ICD-10-CM | POA: Diagnosis not present

## 2024-03-07 DIAGNOSIS — F458 Other somatoform disorders: Secondary | ICD-10-CM

## 2024-03-07 DIAGNOSIS — J4540 Moderate persistent asthma, uncomplicated: Secondary | ICD-10-CM

## 2024-03-07 NOTE — Progress Notes (Addendum)
 [facility] Room Number: 121-P Place of Service:  SNF [phone]) Provider:  Greig Cluster, NP  PCP: [name], MD  Patient Care Team: [name], MD as PCP - General (Internal Medicine) Brien Belvie BRAVO, MD (Pulmonary Disease)  Extended Emergency Contact Information Primary Emergency Contact: [place],Suzanne  [place] Home Phone: [phone] Mobile Phone: [phone] Relation: Daughter  Code Status:  DNR Goals of care: Advanced Directive information    03/07/2024    9:43 AM  Advanced Directives  Does Patient Have a Medical Advance Directive? Yes  Type of Estate agent of [place];Living will;Out of facility DNR (pink MOST or yellow form)  Does patient want to make changes to medical advance directive? No - Patient declined  Copy of Healthcare Power of Attorney in Chart? Yes - validated most recent copy scanned in chart (See row information)     Chief Complaint  Patient presents with   Medical Management of Chronic Issues    Routine visit. Discuss the need for Influenza vaccine, and Covid Booster.     HPI:  Pt is a 89 y.o. female seen today for medical management of chronic diseases.   She currently resides on the skilled nursing unit at [facility]. PMH: HTN, allergic rhinitis, asthma, GERD, Alzheimer's, OA, osteoporosis, CKD 3b, and depression.   Alzheimer's with delusions- CT head 2018 chronic ischemic microvascular disease, behaviors with ADLs, non ambulatory, dependent with ADLs, sleeps most of day per nursing (TSH 3.24 09/17/2023), remains on Namenda , Seroquel  and vistaril  prn HTN- BUN/creat 27.2/1.0 09/17/2023, remains on lasix CKD- see above, GFR 51 (02/14)> was 55 03/17/2023 Persistent Asthma- followed by Dr. Di seen 02/15/2024> f/u in 1 year, remains on Dupixent , nebulized ICS/LABA and duonebs, Singulair  and Flonase  Bruxism- remains on Seroquel   GERD- hgb 10.9  09/17/2023, remains on Prilosec Depression- no recent mood changes, Na+ 137 09/17/2023, remains on Prozac  Recent weights:  08/01- 170.6 lbs  07/01- 168.2 lbs  05/05- 168.2 lbs  Recent blood pressures:  07/29- 142/70  07/22- 130/78  07/15- 148/66    Past Medical History:  Diagnosis Date   Age-related osteoporosis without current pathological fracture    Alzheimer's disease (HCC)    with late onset   Arthritis    Asthma    Deficiency of other specified B group vitamins    Depression    Environmental allergies    mold   Fall    GERD without esophagitis    Hyperlipidemia    Hypertension    Hypertensive kidney disease with CKD (chronic kidney disease)    with stage I through stage 4 CKD    Memory loss    Seasonal allergies    Unspecified osteoarthritis, unspecified site    Past Surgical History:  Procedure Laterality Date   HIP ARTHROPLASTY  06/25/2011   Procedure: ARTHROPLASTY BIPOLAR HIP;  Surgeon: Lynwood SQUIBB [place];  Location: [facility];  Service: Orthopedics;  Laterality: Right;   NASAL SINUS SURGERY     VAGINAL HYSTERECTOMY      Allergies  Allergen Reactions   Azithromycin  Other (See Comments)    Nausea, weakness    Aspirin Other (See Comments)    Unknown reaction   Erythromycin    Molds & Smuts    Other     Seasonal Allergies.   Septra [Sulfamethoxazole-Trimethoprim]    Sulfa Antibiotics    Sulfonamide Derivatives Other (See Comments)    Unknown reaction    Allergies as of  03/07/2024       Reactions   Azithromycin  Other (See Comments)   Nausea, weakness    Aspirin Other (See Comments)   Unknown reaction   Erythromycin    Molds & Smuts    Other    Seasonal Allergies.   Septra [sulfamethoxazole-trimethoprim]    Sulfa Antibiotics    Sulfonamide Derivatives Other (See Comments)   Unknown reaction        Medication List        Accurate as of March 07, 2024  9:54 AM. If you have any questions, ask your nurse or doctor.           acetaminophen  500 MG tablet Commonly known as: TYLENOL  Take 500 mg by mouth 3 (three) times daily.   benzonatate  200 MG capsule Commonly known as: TESSALON  Take 1 capsule (200 mg total) by mouth 3 (three) times daily as needed for cough.   budesonide  0.5 MG/2ML nebulizer solution Commonly known as: PULMICORT  Take 2 mLs (0.5 mg total) by nebulization 2 (two) times daily.   CALCIUM 600-D PO Take 1 tablet by mouth daily.   chlorhexidine 0.12 % solution Commonly known as: PERIDEX Use as directed 15 mLs in the mouth or throat 2 (two) times daily.   cyanocobalamin 1000 MCG tablet Commonly known as: VITAMIN B12 Take 3,000 mcg by mouth daily.   dextromethorphan 30 MG/5ML liquid Commonly known as: DELSYM Take 60 mg by mouth every 12 (twelve) hours as needed for cough.   Dupixent  300 MG/2ML prefilled syringe Generic drug: dupilumab  Inject 300 mg into the skin every 14 (fourteen) days.   EPINEPHrine  1 MG/ML Soln injection Commonly known as: ADRENALIN  Inject 0.3 mg into the muscle as needed for anaphylaxis.   FLUoxetine 40 MG capsule Commonly known as: PROZAC Take 40 mg by mouth daily.   fluticasone  50 MCG/ACT nasal spray Commonly known as: FLONASE  Place 2 sprays into both nostrils daily. 8 am - 11 am   formoterol  20 MCG/2ML nebulizer solution Commonly known as: Perforomist  Take 2 mLs (20 mcg total) by nebulization 2 (two) times daily.   furosemide 20 MG tablet Commonly known as: LASIX Take 20 mg by mouth daily. Hold for SBP<120   hydrOXYzine  25 MG tablet Commonly known as: ATARAX  Take 25 mg by mouth 3 (three) times daily as needed for anxiety.   ipratropium-albuterol  0.5-2.5 (3) MG/3ML Soln Commonly known as: DUONEB Take 3 mLs by nebulization every 6 (six) hours as needed.   memantine  10 MG tablet Commonly known as: Namenda  Take 1 tablet (10 mg total) by mouth 2 (two) times daily.   mineral oil-hydrophilic petrolatum ointment Apply 1 Application topically  every evening.   cetaphil cream Apply topically 2 (two) times daily.   montelukast  10 MG tablet Commonly known as: SINGULAIR  Take 1 tablet (10 mg total) by mouth daily.   nystatin powder Commonly known as: MYCOSTATIN/NYSTOP Apply 1 Application topically every morning.   omeprazole 20 MG capsule Commonly known as: PRILOSEC Take 20 mg by mouth every morning. 30 minutes before breakfast.   omeprazole 20 MG capsule Commonly known as: PRILOSEC Take 20 mg by mouth 2 (two) times daily.   polyethylene glycol 17 g packet Commonly known as: MIRALAX  / GLYCOLAX  Take 17 g by mouth every 3 (three) days.   QUEtiapine  25 MG tablet Commonly known as: SEROQUEL  Take 25 mg by mouth at bedtime.   Systane 0.4-0.3 % Gel ophthalmic gel Generic drug: Polyethyl Glycol-Propyl Glycol Place 1 application into both eyes at bedtime.  UNABLE TO FIND JOBST HOSE KN 15-20 SM BG   Vitamin D 50 MCG (2000 UT) tablet Take 2,000 Units by mouth at bedtime.        Review of Systems  Unable to perform ROS: Dementia    Immunization History  Administered Date(s) Administered   Fluad Trivalent(High Dose 65+) 05/25/2023   Influenza Inj Mdck Quad Pf 05/18/2019, 05/07/2021   Influenza Split 04/04/2012, 05/03/2013, 05/10/2015   Influenza Whole 04/18/2008, 06/04/2009, 04/03/2010, 03/30/2011   Influenza, High Dose Seasonal PF 04/20/2018, 05/24/2020   Influenza,inj,Quad PF,6+ Mos 03/26/2017   Influenza-Unspecified 03/26/2010, 04/13/2012, 05/08/2013, 05/14/2014, 06/03/2014, 05/13/2015, 04/27/2016, 05/08/2022   Moderna Covid-19 Fall Seasonal Vaccine 89yrs & older 06/08/2022   Moderna Covid-19 Vaccine  Bivalent Booster 77yrs & up 11/28/2021, 05/25/2023   Moderna SARS-COV2 Booster Vaccination 06/13/2020, 03/25/2021, 05/14/2021   Moderna Sars-Covid-2 Vaccination 09/05/2019, 10/03/2019   Pfizer Covid-19 Vaccine Bivalent Booster 69yrs & up 11/26/2022   Pneumococcal Conjugate-13 03/05/2014   Pneumococcal  Polysaccharide-23 06/04/2009   Pneumococcal-Unspecified 03/13/2009   Respiratory Syncytial Virus Vaccine,Recomb Aduvanted(Arexvy) 07/23/2022   Td 03/13/2009, 04/13/2012   Tdap 12/01/2022   Zoster Recombinant(Shingrix) 08/03/2017, 10/01/2017   Zoster, Live 03/21/2010   Pertinent  Health Maintenance Due  Topic Date Due   INFLUENZA VACCINE  03/03/2024   DEXA SCAN  Discontinued      02/16/2023    2:48 PM 05/26/2023    8:27 PM 08/17/2023    7:01 PM 12/08/2023   11:25 AM 01/05/2024   10:46 AM  Fall Risk  Falls in the past year? 0 0 0 0 0  Was there an injury with Fall? 0 0 0 0 0  Fall Risk Category Calculator 0 0 0 0 0  Patient at Risk for Falls Due to History of fall(s);Impaired balance/gait;Impaired mobility Impaired mobility;Impaired balance/gait;History of fall(s) History of fall(s);Impaired balance/gait History of fall(s);Impaired balance/gait;Impaired mobility History of fall(s);Impaired balance/gait;Impaired mobility  Fall risk Follow up Falls evaluation completed;Education provided;Falls prevention discussed Falls evaluation completed;Education provided;Falls prevention discussed Education provided;Falls evaluation completed Falls evaluation completed;Education provided Falls evaluation completed;Education provided   Functional Status Survey:    Vitals:   03/07/24 0942  BP: (!) 142/70  Pulse: 63  Resp: 14  Temp: (!) 97.3 F (36.3 C)  SpO2: 97%  Weight: 170 lb 9.6 oz (77.4 kg)  Height: 5' 2 (1.575 m)   Body mass index is 31.2 kg/m. Physical Exam Vitals reviewed.  Constitutional:      General: She is not in acute distress. HENT:     Head: Normocephalic.     Right Ear: There is no impacted cerumen.     Left Ear: There is no impacted cerumen.     Nose: Nose normal.     Mouth/Throat:     Mouth: Mucous membranes are moist.  Eyes:     General:        Right eye: No discharge.        Left eye: No discharge.  Cardiovascular:     Rate and Rhythm: Normal rate and regular  rhythm.     Pulses: Normal pulses.     Heart sounds: Normal heart sounds.  Pulmonary:     Effort: Pulmonary effort is normal.     Breath sounds: Normal breath sounds.  Abdominal:     General: Bowel sounds are normal. There is no distension.     Palpations: Abdomen is soft.     Tenderness: There is no abdominal tenderness.  Musculoskeletal:     Cervical back: Neck supple.  Right lower leg: No edema.     Left lower leg: No edema.  Skin:    General: Skin is warm.     Capillary Refill: Capillary refill takes less than 2 seconds.     Comments: Dime sized skin tear x 2 to left shin, CDI, no sign of infection  Neurological:     General: No focal deficit present.     Mental Status: She is easily aroused. Mental status is at baseline.     Motor: Weakness present.     Gait: Gait abnormal.  Psychiatric:     Comments: Alert to self, does not follow commands     Labs reviewed: Recent Labs    03/15/23 0000 09/17/23 0000  NA 139 137  K 4.5 4.7  CL 103 103  CO2 22 22  BUN 21 27*  CREATININE 0.9 1.0  CALCIUM 9.3 9.3   Recent Labs    03/15/23 0000 09/17/23 0000  AST 17 17  ALT 12 12  ALKPHOS 113 89  ALBUMIN 3.6 3.5   No results for input(s): WBC, NEUTROABS, HGB, HCT, MCV, PLT in the last 8760 hours. Lab Results  Component Value Date   TSH 3.24 09/17/2023   No results found for: HGBA1C No results found for: CHOL, HDL, LDLCALC, LDLDIRECT, TRIG, CHOLHDL  Significant Diagnostic Results in last 30 days:  No results found.  Assessment/Plan 1. Skin tear of left lower leg without complication, initial encounter (Primary) - DOI 8/04 - dime sized x 2, no infection  - cont polymem dressing changes prn  2. 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 Essential hypertension - controlled with  furosemide  4. Stage 3a chronic kidney disease (HCC) - encourage hydration  - avoid NSAIDS  5. Moderate persistent asthma without complication - recently seen Dr. Annella 07/15> no changes - no recent exacerbations - cont Dupixent , nebulized ICS/LABA and duonebs, Singulair  and Flonase   6. Bruxism - ongoing  - cont Seroquel   7. Gastroesophageal reflux disease without esophagitis - hgb stable - cont omeprazole  8. Recurrent depression (HCC) - Na+ stable - cont Prozac    Family/ staff Communication: plan discussed with nurse  Labs/tests ordered:  cbc/diff and cmp 03/16/2024

## 2024-03-16 LAB — BASIC METABOLIC PANEL WITH GFR
BUN: 23 — AB (ref 4–21)
CO2: 24 — AB (ref 13–22)
Chloride: 104 (ref 99–108)
Creatinine: 0.9 (ref 0.5–1.1)
Glucose: 87
Potassium: 4 meq/L (ref 3.5–5.1)
Sodium: 138 (ref 137–147)

## 2024-03-16 LAB — CBC AND DIFFERENTIAL
HCT: 33 — AB (ref 36–46)
Hemoglobin: 10.9 — AB (ref 12.0–16.0)
Platelets: 247 K/uL (ref 150–400)
WBC: 6.8

## 2024-03-16 LAB — COMPREHENSIVE METABOLIC PANEL WITH GFR
Albumin: 3.5 (ref 3.5–5.0)
Calcium: 9.5 (ref 8.7–10.7)
Globulin: 2.1

## 2024-03-16 LAB — CBC: RBC: 3.85 — AB (ref 3.87–5.11)

## 2024-03-16 LAB — HEPATIC FUNCTION PANEL
ALT: 12 U/L (ref 7–35)
AST: 19 (ref 13–35)
Alkaline Phosphatase: 103 (ref 25–125)

## 2024-04-17 ENCOUNTER — Encounter: Payer: Self-pay | Admitting: Internal Medicine

## 2024-04-17 ENCOUNTER — Non-Acute Institutional Stay (SKILLED_NURSING_FACILITY): Payer: Self-pay | Admitting: Internal Medicine

## 2024-04-17 DIAGNOSIS — D631 Anemia in chronic kidney disease: Secondary | ICD-10-CM

## 2024-04-17 DIAGNOSIS — F339 Major depressive disorder, recurrent, unspecified: Secondary | ICD-10-CM | POA: Diagnosis not present

## 2024-04-17 DIAGNOSIS — G309 Alzheimer's disease, unspecified: Secondary | ICD-10-CM | POA: Diagnosis not present

## 2024-04-17 DIAGNOSIS — J4540 Moderate persistent asthma, uncomplicated: Secondary | ICD-10-CM

## 2024-04-17 DIAGNOSIS — F0282 Dementia in other diseases classified elsewhere, unspecified severity, with psychotic disturbance: Secondary | ICD-10-CM

## 2024-04-17 DIAGNOSIS — N1831 Chronic kidney disease, stage 3a: Secondary | ICD-10-CM

## 2024-04-17 DIAGNOSIS — K219 Gastro-esophageal reflux disease without esophagitis: Secondary | ICD-10-CM

## 2024-04-17 NOTE — Progress Notes (Signed)
 [facility] Room Number: 121-P Place of Service:  SNF [phone]) Provider:  Krystal Bring  PCP: Bring Fredia CROME, MD  Patient Care Team: Bring Fredia CROME, MD as PCP - General (Internal Medicine) Brien Belvie BRAVO, MD (Pulmonary Disease)  Extended Emergency Contact Information Primary Emergency Contact: [place],Suzanne  [place] Home Phone: [phone] Mobile Phone: [phone] Relation: Daughter  Code Status:  DNR Goals of care: Advanced Directive information    04/17/2024    2:03 PM  Advanced Directives  Does Patient Have a Medical Advance Directive? Yes  Type of Estate agent of [place];Living will;Out of facility DNR (pink MOST or yellow form)  Does patient want to make changes to medical advance directive? No - Patient declined  Copy of Healthcare Power of Attorney in Chart? Yes - validated most recent copy scanned in chart (See row information)     Chief Complaint  Patient presents with   Medical Management of Chronic Issues    Routine visit. Discuss the need for Influenza vaccine, and Covid Booster    HPI:  Pt is a 89 y.o. female seen today for medical management of chronic diseases.    Lives in SNF in [place]   Patient has a history of Alzheimer's dementia dependent for her ADLs Recent Decline with behaviors Vistaril  seems to be helping with behaviors. She is now Comfort Care Osteoporosis and knee osteoarthritis CKD stage 3b Severe asthma Eosinophilic Sees Pulmonary stable in Dupixent  HLD and hypertension  Teeth Grinding Mouth Guard Helps  Also She is needing lot of help and reminder with her Food Holds it and does not swallow But her weight has been stable Hoyer dependent   Past Medical History:  Diagnosis Date   Age-related osteoporosis without current pathological fracture    Alzheimer's disease (HCC)    with late onset   Arthritis    Asthma    Deficiency of  other specified B group vitamins    Depression    Environmental allergies    mold   Fall    GERD without esophagitis    Hyperlipidemia    Hypertension    Hypertensive kidney disease with CKD (chronic kidney disease)    with stage I through stage 4 CKD    Memory loss    Seasonal allergies    Unspecified osteoarthritis, unspecified site    Past Surgical History:  Procedure Laterality Date   HIP ARTHROPLASTY  06/25/2011   Procedure: ARTHROPLASTY BIPOLAR HIP;  Surgeon: Lynwood SQUIBB [place];  Location: [facility];  Service: Orthopedics;  Laterality: Right;   NASAL SINUS SURGERY     VAGINAL HYSTERECTOMY      Allergies  Allergen Reactions   Azithromycin  Other (See Comments)    Nausea, weakness    Aspirin Other (See Comments)    Unknown reaction   Erythromycin    Molds & Smuts    Other     Seasonal Allergies.   Septra [Sulfamethoxazole-Trimethoprim]    Sulfa Antibiotics    Sulfonamide Derivatives Other (See Comments)    Unknown reaction    Allergies as of 04/17/2024       Reactions   Azithromycin  Other (See Comments)   Nausea, weakness    Aspirin Other (See Comments)   Unknown reaction   Erythromycin    Molds & Smuts    Other    Seasonal Allergies.   Septra [sulfamethoxazole-trimethoprim]    Sulfa Antibiotics    Sulfonamide Derivatives Other (See Comments)  Unknown reaction        Medication List        Accurate as of April 17, 2024  2:03 PM. If you have any questions, ask your nurse or doctor.          acetaminophen  500 MG tablet Commonly known as: TYLENOL  Take 500 mg by mouth 3 (three) times daily.   benzonatate  200 MG capsule Commonly known as: TESSALON  Take 1 capsule (200 mg total) by mouth 3 (three) times daily as needed for cough.   budesonide  0.5 MG/2ML nebulizer solution Commonly known as: PULMICORT  Take 2 mLs (0.5 mg total) by nebulization 2 (two) times daily.   CALCIUM 600-D PO Take 1 tablet by mouth daily.   chlorhexidine 0.12 %  solution Commonly known as: PERIDEX Use as directed 15 mLs in the mouth or throat 2 (two) times daily.   cyanocobalamin 1000 MCG tablet Commonly known as: VITAMIN B12 Take 3,000 mcg by mouth daily.   dextromethorphan 30 MG/5ML liquid Commonly known as: DELSYM Take 60 mg by mouth every 12 (twelve) hours as needed for cough.   Dupixent  300 MG/2ML prefilled syringe Generic drug: dupilumab  Inject 300 mg into the skin every 14 (fourteen) days.   EPINEPHrine  1 MG/ML Soln injection Commonly known as: ADRENALIN  Inject 0.3 mg into the muscle as needed for anaphylaxis.   FLUoxetine 40 MG capsule Commonly known as: PROZAC Take 40 mg by mouth daily.   fluticasone  50 MCG/ACT nasal spray Commonly known as: FLONASE  Place 2 sprays into both nostrils daily. 8 am - 11 am   formoterol  20 MCG/2ML nebulizer solution Commonly known as: Perforomist  Take 2 mLs (20 mcg total) by nebulization 2 (two) times daily.   furosemide 20 MG tablet Commonly known as: LASIX Take 20 mg by mouth daily. Hold for SBP<120   hydrOXYzine  25 MG tablet Commonly known as: ATARAX  Take 25 mg by mouth 3 (three) times daily as needed for anxiety.   ipratropium-albuterol  0.5-2.5 (3) MG/3ML Soln Commonly known as: DUONEB Take 3 mLs by nebulization every 6 (six) hours as needed.   memantine  10 MG tablet Commonly known as: Namenda  Take 1 tablet (10 mg total) by mouth 2 (two) times daily.   mineral oil-hydrophilic petrolatum ointment Apply 1 Application topically every evening.   cetaphil cream Apply topically 2 (two) times daily.   montelukast  10 MG tablet Commonly known as: SINGULAIR  Take 1 tablet (10 mg total) by mouth daily.   nystatin powder Commonly known as: MYCOSTATIN/NYSTOP Apply 1 Application topically every morning. Apply to bilateral breast folds for yeast rash.   omeprazole 20 MG capsule Commonly known as: PRILOSEC Take 20 mg by mouth 2 (two) times daily.   polyethylene glycol 17 g  packet Commonly known as: MIRALAX  / GLYCOLAX  Take 17 g by mouth every 3 (three) days.   QUEtiapine  25 MG tablet Commonly known as: SEROQUEL  Take 25 mg by mouth at bedtime.   Systane 0.4-0.3 % Gel ophthalmic gel Generic drug: Polyethyl Glycol-Propyl Glycol Place 1 application into both eyes at bedtime.   UNABLE TO FIND JOBST HOSE KN 15-20 SM BG   Vitamin D 50 MCG (2000 UT) tablet Take 2,000 Units by mouth at bedtime.        Review of Systems  Unable to perform ROS: Dementia    Immunization History  Administered Date(s) Administered   Fluad Trivalent(High Dose 65+) 05/25/2023   INFLUENZA, HIGH DOSE SEASONAL PF 04/20/2018, 05/24/2020   Influenza Inj Mdck Quad Pf 05/18/2019, 05/07/2021   Influenza  Split 04/04/2012, 05/03/2013, 05/10/2015   Influenza Whole 04/18/2008, 06/04/2009, 04/03/2010, 03/30/2011   Influenza,inj,Quad PF,6+ Mos 03/26/2017   Influenza-Unspecified 03/26/2010, 04/13/2012, 05/08/2013, 05/14/2014, 06/03/2014, 05/13/2015, 04/27/2016, 05/08/2022   Moderna Covid-19 Fall Seasonal Vaccine 48yrs & older 06/08/2022   Moderna Covid-19 Vaccine  Bivalent Booster 57yrs & up 11/28/2021, 05/25/2023   Moderna SARS-COV2 Booster Vaccination 06/13/2020, 03/25/2021, 05/14/2021   Moderna Sars-Covid-2 Vaccination 09/05/2019, 10/03/2019   Pfizer Covid-19 Vaccine Bivalent Booster 35yrs & up 11/26/2022   Pneumococcal Conjugate-13 03/05/2014   Pneumococcal Polysaccharide-23 06/04/2009   Pneumococcal-Unspecified 03/13/2009   Respiratory Syncytial Virus Vaccine,Recomb Aduvanted(Arexvy) 07/23/2022   Td 03/13/2009, 04/13/2012   Tdap 12/01/2022   Zoster Recombinant(Shingrix) 08/03/2017, 10/01/2017   Zoster, Live 03/21/2010   Pertinent  Health Maintenance Due  Topic Date Due   Influenza Vaccine  03/03/2024   DEXA SCAN  Discontinued      05/26/2023    8:27 PM 08/17/2023    7:01 PM 12/08/2023   11:25 AM 01/05/2024   10:46 AM 03/07/2024    1:31 PM  Fall Risk  Falls in the past year?  0 0 0 0 0  Was there an injury with Fall? 0 0 0 0 0  Fall Risk Category Calculator 0 0 0 0 0  Patient at Risk for Falls Due to Impaired mobility;Impaired balance/gait;History of fall(s) History of fall(s);Impaired balance/gait History of fall(s);Impaired balance/gait;Impaired mobility History of fall(s);Impaired balance/gait;Impaired mobility History of fall(s);Impaired balance/gait;Impaired mobility  Fall risk Follow up Falls evaluation completed;Education provided;Falls prevention discussed Education provided;Falls evaluation completed Falls evaluation completed;Education provided Falls evaluation completed;Education provided Falls evaluation completed;Education provided   Functional Status Survey:    Vitals:   04/17/24 1346  BP: 102/80  Pulse: 62  Resp: 14  Temp: (!) 97.3 F (36.3 C)  SpO2: 97%  Weight: 168 lb 6.4 oz (76.4 kg)  Height: 5' 2 (1.575 m)   Body mass index is 30.8 kg/m. Physical Exam Vitals reviewed.  Constitutional:      Appearance: Normal appearance.  HENT:     Head: Normocephalic.     Nose: Nose normal.     Mouth/Throat:     Mouth: Mucous membranes are moist.     Pharynx: Oropharynx is clear.  Eyes:     Pupils: Pupils are equal, round, and reactive to light.  Cardiovascular:     Rate and Rhythm: Normal rate and regular rhythm.     Pulses: Normal pulses.     Heart sounds: Normal heart sounds. No murmur heard. Pulmonary:     Effort: Pulmonary effort is normal.     Breath sounds: Normal breath sounds.  Abdominal:     General: Abdomen is flat. Bowel sounds are normal.     Palpations: Abdomen is soft.  Musculoskeletal:        General: No swelling.     Cervical back: Neck supple.  Skin:    General: Skin is warm.  Neurological:     Mental Status: She is alert.  Psychiatric:        Mood and Affect: Mood normal.        Thought Content: Thought content normal.     Labs reviewed: Recent Labs    09/17/23 0000 03/16/24 0000  NA 137 138  K 4.7 4.0   CL 103 104  CO2 22 24*  BUN 27* 23*  CREATININE 1.0 0.9  CALCIUM 9.3 9.5   Recent Labs    09/17/23 0000 03/16/24 0000  AST 17 19  ALT 12 12  ALKPHOS 89 103  ALBUMIN 3.5 3.5   Recent Labs    03/16/24 0000  WBC 6.8  HGB 10.9*  HCT 33*  PLT 247   Lab Results  Component Value Date   TSH 3.24 09/17/2023   No results found for: HGBA1C No results found for: CHOL, HDL, LDLCALC, LDLDIRECT, TRIG, CHOLHDL  Significant Diagnostic Results in last 30 days:  No results found.  Assessment/Plan 1. Dementia in Alzheimer's disease with delusions (HCC) (Primary) Total Care On Namenda  and Atarax  Seroquel  for Behaviors Non Ambulatory  Goals are comfort 2. Moderate persistent asthma without complication On Budesonide  and Dupixent   3. Gastroesophageal reflux disease without esophagitis Prilosec  4. Recurrent depression (HCC) Prozac  5. Anemia due to stage 3a chronic kidney disease (HCC) HGB stable No Work up due to her Age Continue Monitor  6 On Lasix  Creat stable   Family/ staff Communication:   Labs/tests ordered:

## 2024-04-25 ENCOUNTER — Non-Acute Institutional Stay (SKILLED_NURSING_FACILITY): Payer: Self-pay | Admitting: Orthopedic Surgery

## 2024-04-25 ENCOUNTER — Encounter: Payer: Self-pay | Admitting: Orthopedic Surgery

## 2024-04-25 DIAGNOSIS — J4550 Severe persistent asthma, uncomplicated: Secondary | ICD-10-CM

## 2024-04-25 DIAGNOSIS — G309 Alzheimer's disease, unspecified: Secondary | ICD-10-CM

## 2024-04-25 DIAGNOSIS — R051 Acute cough: Secondary | ICD-10-CM | POA: Diagnosis not present

## 2024-04-25 DIAGNOSIS — F0282 Dementia in other diseases classified elsewhere, unspecified severity, with psychotic disturbance: Secondary | ICD-10-CM

## 2024-04-25 MED ORDER — PREDNISONE 20 MG PO TABS: 20.0000 mg | ORAL_TABLET | Freq: Every day | ORAL | Status: AC

## 2024-04-25 NOTE — Addendum Note (Signed)
 Addended byBETHA GIL NO E on: 04/25/2024 03:01 PM   Modules accepted: Orders

## 2024-04-25 NOTE — Progress Notes (Addendum)
 Location[facility] Room Number: 121/A Place of Service:  SNF [phone]) Provider:  Greig FORBES Cluster, NP   [name], MD  Patient Care Team: [name], MD as PCP - General (Internal Medicine) Brien Belvie FORBES, MD (Pulmonary Disease)  Extended Emergency Contact Information Primary Emergency Contact: [place],Suzanne  [place] Home Phone: [phone] Mobile Phone: [phone] Relation: Daughter  Code Status:  DNR Goals of care: Advanced Directive information    04/17/2024    2:03 PM  Advanced Directives  Does Patient Have a Medical Advance Directive? Yes  Type of Estate agent of [place];Living will;Out of facility DNR (pink MOST or yellow form)  Does patient want to make changes to medical advance directive? No - Patient declined  Copy of Healthcare Power of Attorney in Chart? Yes - validated most recent copy scanned in chart (See row information)     Chief Complaint  Patient presents with   Acute Visit    Productive cough    HPI:  Pt is a 89 y.o. female seen today for acute visit due to productive cough.   She currently resides on the skilled nursing unit at [facility]. PMH: HTN, allergic rhinitis, asthma, GERD, Alzheimer's, OA, osteoporosis, CKD 3b, and depression.   Poor historian due to dementia. H/o asthma. Nursing reports increased productive cough x 2 days. She is not using oxygen. She is currently taking pulmicort  and formoterol  nebulizer BID. She was also given duoneb prior to my assessment. Remains on furosemide for HTN. Afebrile. Vitals stable.    Past Medical History:  Diagnosis Date   Age-related osteoporosis without current pathological fracture    Alzheimer's disease (HCC)    with late onset   Arthritis    Asthma    Deficiency of other specified B group vitamins    Depression    Environmental allergies    mold   Fall    GERD without esophagitis    Hyperlipidemia     Hypertension    Hypertensive kidney disease with CKD (chronic kidney disease)    with stage I through stage 4 CKD    Memory loss    Seasonal allergies    Unspecified osteoarthritis, unspecified site    Past Surgical History:  Procedure Laterality Date   HIP ARTHROPLASTY  06/25/2011   Procedure: ARTHROPLASTY BIPOLAR HIP;  Surgeon: Lynwood SQUIBB [place];  Location: [facility];  Service: Orthopedics;  Laterality: Right;   NASAL SINUS SURGERY     VAGINAL HYSTERECTOMY      Allergies  Allergen Reactions   Azithromycin  Other (See Comments)    Nausea, weakness    Aspirin Other (See Comments)    Unknown reaction   Erythromycin    Molds & Smuts    Other     Seasonal Allergies.   Septra [Sulfamethoxazole-Trimethoprim]    Sulfa Antibiotics    Sulfonamide Derivatives Other (See Comments)    Unknown reaction    Outpatient Encounter Medications as of 04/25/2024  Medication Sig   acetaminophen  (TYLENOL ) 500 MG tablet Take 500 mg by mouth 3 (three) times daily.   benzonatate  (TESSALON ) 200 MG capsule Take 1 capsule (200 mg total) by mouth 3 (three) times daily as needed for cough.   budesonide  (PULMICORT ) 0.5 MG/2ML nebulizer solution Take 2 mLs (0.5 mg total) by nebulization 2 (two) times daily.   Calcium Carb-Cholecalciferol (CALCIUM 600-D PO) Take 1 tablet by mouth daily.   chlorhexidine (PERIDEX) 0.12 % solution Use as directed 15 mLs  in the mouth or throat 2 (two) times daily.   Cholecalciferol (VITAMIN D) 2000 units tablet Take 2,000 Units by mouth at bedtime.   dextromethorphan (DELSYM) 30 MG/5ML liquid Take 60 mg by mouth every 12 (twelve) hours as needed for cough.   dupilumab  (DUPIXENT ) 300 MG/2ML prefilled syringe Inject 300 mg into the skin every 14 (fourteen) days.   Emollient (CETAPHIL) cream Apply topically 2 (two) times daily.   EPINEPHrine  (ADRENALIN ) 1 MG/ML SOLN injection Inject 0.3 mg into the muscle as needed for anaphylaxis.   FLUoxetine (PROZAC) 40 MG capsule Take 40 mg by  mouth daily.   fluticasone  (FLONASE ) 50 MCG/ACT nasal spray Place 2 sprays into both nostrils daily. 8 am - 11 am   formoterol  (PERFOROMIST ) 20 MCG/2ML nebulizer solution Take 2 mLs (20 mcg total) by nebulization 2 (two) times daily.   furosemide (LASIX) 20 MG tablet Take 20 mg by mouth daily. Hold for SBP<120   hydrOXYzine  (ATARAX ) 25 MG tablet Take 25 mg by mouth 3 (three) times daily as needed for anxiety.   ipratropium-albuterol  (DUONEB) 0.5-2.5 (3) MG/3ML SOLN Take 3 mLs by nebulization every 6 (six) hours as needed.   memantine  (NAMENDA ) 10 MG tablet Take 1 tablet (10 mg total) by mouth 2 (two) times daily.   mineral oil-hydrophilic petrolatum (AQUAPHOR) ointment Apply 1 Application topically every evening.   montelukast  (SINGULAIR ) 10 MG tablet Take 1 tablet (10 mg total) by mouth daily.   nystatin (MYCOSTATIN/NYSTOP) powder Apply 1 Application topically every morning. Apply to bilateral breast folds for yeast rash.   omeprazole (PRILOSEC) 20 MG capsule Take 20 mg by mouth 2 (two) times daily.   Polyethyl Glycol-Propyl Glycol (SYSTANE) 0.4-0.3 % GEL ophthalmic gel Place 1 application into both eyes at bedtime.   polyethylene glycol (MIRALAX  / GLYCOLAX ) packet Take 17 g by mouth every 3 (three) days.   QUEtiapine  (SEROQUEL ) 25 MG tablet Take 25 mg by mouth at bedtime.   UNABLE TO FIND JOBST HOSE KN 15-20 SM BG   vitamin B-12 (CYANOCOBALAMIN) 1000 MCG tablet Take 3,000 mcg by mouth daily.   [DISCONTINUED] rivaroxaban  (XARELTO ) 10 MG TABS tablet Take 1 tablet (10 mg total) by mouth daily.   No facility-administered encounter medications on file as of 04/25/2024.    Review of Systems  Unable to perform ROS: Dementia    Immunization History  Administered Date(s) Administered   Fluad Trivalent(High Dose 65+) 05/25/2023   INFLUENZA, HIGH DOSE SEASONAL PF 04/20/2018, 05/24/2020   Influenza Inj Mdck Quad Pf 05/18/2019, 05/07/2021   Influenza Split 04/04/2012, 05/03/2013, 05/10/2015    Influenza Whole 04/18/2008, 06/04/2009, 04/03/2010, 03/30/2011   Influenza,inj,Quad PF,6+ Mos 03/26/2017   Influenza-Unspecified 03/26/2010, 04/13/2012, 05/08/2013, 05/14/2014, 06/03/2014, 05/13/2015, 04/27/2016, 05/08/2022   Moderna Covid-19 Fall Seasonal Vaccine 87yrs & older 06/08/2022   Moderna Covid-19 Vaccine  Bivalent Booster 11yrs & up 11/28/2021, 05/25/2023   Moderna SARS-COV2 Booster Vaccination 06/13/2020, 03/25/2021, 05/14/2021   Moderna Sars-Covid-2 Vaccination 09/05/2019, 10/03/2019   Pfizer Covid-19 Vaccine Bivalent Booster 69yrs & up 11/26/2022   Pneumococcal Conjugate-13 03/05/2014   Pneumococcal Polysaccharide-23 06/04/2009   Pneumococcal-Unspecified 03/13/2009   Respiratory Syncytial Virus Vaccine,Recomb Aduvanted(Arexvy) 07/23/2022   Td 03/13/2009, 04/13/2012   Tdap 12/01/2022   Zoster Recombinant(Shingrix) 08/03/2017, 10/01/2017   Zoster, Live 03/21/2010   Pertinent  Health Maintenance Due  Topic Date Due   Influenza Vaccine  03/03/2024   DEXA SCAN  Discontinued      05/26/2023    8:27 PM 08/17/2023    7:01 PM 12/08/2023  11:25 AM 01/05/2024   10:46 AM 03/07/2024    1:31 PM  Fall Risk  Falls in the past year? 0 0 0 0 0  Was there an injury with Fall? 0 0 0 0 0  Fall Risk Category Calculator 0 0 0 0 0  Patient at Risk for Falls Due to Impaired mobility;Impaired balance/gait;History of fall(s) History of fall(s);Impaired balance/gait History of fall(s);Impaired balance/gait;Impaired mobility History of fall(s);Impaired balance/gait;Impaired mobility History of fall(s);Impaired balance/gait;Impaired mobility  Fall risk Follow up Falls evaluation completed;Education provided;Falls prevention discussed Education provided;Falls evaluation completed Falls evaluation completed;Education provided Falls evaluation completed;Education provided Falls evaluation completed;Education provided   Functional Status Survey:    Vitals:   04/25/24 0958  BP: 135/76  Pulse: 64   Resp: 20  Temp: 98.1 F (36.7 C)  SpO2: 96%  Weight: 168 lb 3.2 oz (76.3 kg)  Height: 5' 2 (1.575 m)   Body mass index is 30.76 kg/m. Physical Exam Vitals reviewed.  Constitutional:      General: She is not in acute distress. HENT:     Head: Normocephalic.     Right Ear: Tympanic membrane normal.     Left Ear: Tympanic membrane normal.     Nose: Nose normal.     Mouth/Throat:     Mouth: Mucous membranes are moist.  Eyes:     General:        Right eye: No discharge.        Left eye: No discharge.  Cardiovascular:     Rate and Rhythm: Normal rate and regular rhythm.     Pulses: Normal pulses.     Heart sounds: Normal heart sounds.  Pulmonary:     Effort: Pulmonary effort is normal. No respiratory distress.     Breath sounds: Examination of the right-upper field reveals wheezing. Examination of the left-upper field reveals wheezing. Examination of the right-middle field reveals wheezing. Examination of the left-middle field reveals wheezing. Examination of the right-lower field reveals decreased breath sounds. Examination of the left-lower field reveals decreased breath sounds. Decreased breath sounds and wheezing present. No rales.  Abdominal:     General: Bowel sounds are normal.     Palpations: Abdomen is soft.  Musculoskeletal:     Cervical back: Neck supple.     Right lower leg: No edema.     Left lower leg: No edema.  Lymphadenopathy:     Cervical: No cervical adenopathy.  Skin:    General: Skin is warm.     Capillary Refill: Capillary refill takes less than 2 seconds.  Neurological:     General: No focal deficit present.     Mental Status: She is easily aroused. Mental status is at baseline.     Motor: Weakness present.     Gait: Gait abnormal.  Psychiatric:        Mood and Affect: Mood normal.     Labs reviewed: Recent Labs    09/17/23 0000 03/16/24 0000  NA 137 138  K 4.7 4.0  CL 103 104  CO2 22 24*  BUN 27* 23*  CREATININE 1.0 0.9  CALCIUM 9.3  9.5   Recent Labs    09/17/23 0000 03/16/24 0000  AST 17 19  ALT 12 12  ALKPHOS 89 103  ALBUMIN 3.5 3.5   Recent Labs    03/16/24 0000  WBC 6.8  HGB 10.9*  HCT 33*  PLT 247   Lab Results  Component Value Date   TSH 3.24 09/17/2023   No  results found for: HGBA1C No results found for: CHOL, HDL, LDLCALC, LDLDIRECT, TRIG, CHOLHDL  Significant Diagnostic Results in last 30 days:  No results found.  Assessment/Plan 1. Acute cough (Primary) - onset x 2 days - afebrile, increased insp wheezing to upper lobes after duoneb, bases diminished  - CXR to r/o PNA, effusion, aspiration   2. 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 Severe persistent asthma without complication - followed by Dr. Annella - see above - start prednisone  20 mg po QAM x 7 days - cont Pulmicort  and formoterol  nebs - cont duonebs    Family/ staff Communication: plan discussed with nurse  Labs/tests ordered:  CXR

## 2024-05-04 ENCOUNTER — Non-Acute Institutional Stay (SKILLED_NURSING_FACILITY): Payer: Self-pay | Admitting: Adult Health

## 2024-05-04 ENCOUNTER — Encounter: Payer: Self-pay | Admitting: Adult Health

## 2024-05-04 DIAGNOSIS — J4541 Moderate persistent asthma with (acute) exacerbation: Secondary | ICD-10-CM | POA: Diagnosis not present

## 2024-05-04 DIAGNOSIS — R051 Acute cough: Secondary | ICD-10-CM | POA: Diagnosis not present

## 2024-05-04 MED ORDER — GUAIFENESIN-DM 100-10 MG/5ML PO SYRP: 20.0000 mL | ORAL_SOLUTION | Freq: Two times a day (BID) | ORAL | Status: DC

## 2024-05-04 NOTE — Progress Notes (Signed)
 Location:  Medical illustrator of Service:  SNF (31) Provider:   Bari America, ANP [facility] [phone]   [name], MD  Patient Care Team: [name], MD as PCP - General (Internal Medicine) Brien Belvie BRAVO, MD (Pulmonary Disease)  Extended Emergency Contact Information Primary Emergency Contact: [place]anne  [place] Home Phone: [phone] Mobile Phone: [phone] Relation: Daughter  Code Status:  DNR Goals of care: Advanced Directive information    04/17/2024    2:03 PM  Advanced Directives  Does Patient Have a Medical Advance Directive? Yes  Type of Estate agent of [place];Living will;Out of facility DNR (pink MOST or yellow form)  Does patient want to make changes to medical advance directive? No - Patient declined  Copy of Healthcare Power of Attorney in Chart? Yes - validated most recent copy scanned in chart (See row information)     Chief Complaint  Patient presents with   Acute Visit    cough    HPI:  Pt is a 89 y.o. female seen today for an acute visit for cough  [name] is a 89 year old female. Presented with a cough and wheezing on 04/25/24 CXR obtained which showed no acute process. Started on prednisone  with improvement No sputum production. No fever. Normal 02 sats. Negative for flu and covid.  Poor historian due to dementia.  The nurse reports she still has a cough but can not cough any sputum. At times has rhonchi in both lungs.   Hx of asthma. Followed by Pulmonary On Pulmicort , Dupixent , and formoterol .  Since starting this regimen she has had  a reduction in flare. No recent flare in the past year.     Past Medical History:  Diagnosis Date   Age-related osteoporosis without current pathological fracture    Alzheimer's disease (HCC)    with late onset   Arthritis    Asthma    Deficiency of other specified B group vitamins     Depression    Environmental allergies    mold   Fall    GERD without esophagitis    Hyperlipidemia    Hypertension    Hypertensive kidney disease with CKD (chronic kidney disease)    with stage I through stage 4 CKD    Memory loss    Seasonal allergies    Unspecified osteoarthritis, unspecified site    Past Surgical History:  Procedure Laterality Date   HIP ARTHROPLASTY  06/25/2011   Procedure: ARTHROPLASTY BIPOLAR HIP;  Surgeon: Lynwood SQUIBB [place];  Location: [facility];  Service: Orthopedics;  Laterality: Right;   NASAL SINUS SURGERY     VAGINAL HYSTERECTOMY      Allergies  Allergen Reactions   Azithromycin  Other (See Comments)    Nausea, weakness    Aspirin Other (See Comments)    Unknown reaction   Erythromycin    Molds & Smuts    Other     Seasonal Allergies.   Septra [Sulfamethoxazole-Trimethoprim]    Sulfa Antibiotics    Sulfonamide Derivatives Other (See Comments)    Unknown reaction    Outpatient Encounter Medications as of 05/04/2024  Medication Sig   guaiFENesin -dextromethorphan (ROBITUSSIN DM) 100-10 MG/5ML syrup Take 20 mLs by mouth in the morning and at bedtime for 5 days.   acetaminophen  (TYLENOL ) 500 MG tablet Take 500 mg by mouth 3 (three) times daily.   benzonatate  (TESSALON ) 200 MG capsule Take 1 capsule (200 mg total) by mouth  3 (three) times daily as needed for cough.   budesonide  (PULMICORT ) 0.5 MG/2ML nebulizer solution Take 2 mLs (0.5 mg total) by nebulization 2 (two) times daily.   Calcium Carb-Cholecalciferol (CALCIUM 600-D PO) Take 1 tablet by mouth daily.   chlorhexidine (PERIDEX) 0.12 % solution Use as directed 15 mLs in the mouth or throat 2 (two) times daily.   Cholecalciferol (VITAMIN D) 2000 units tablet Take 2,000 Units by mouth at bedtime.   dextromethorphan (DELSYM) 30 MG/5ML liquid Take 60 mg by mouth every 12 (twelve) hours as needed for cough.   dupilumab  (DUPIXENT ) 300 MG/2ML prefilled syringe Inject 300 mg into the skin every 14  (fourteen) days.   Emollient (CETAPHIL) cream Apply topically 2 (two) times daily.   EPINEPHrine  (ADRENALIN ) 1 MG/ML SOLN injection Inject 0.3 mg into the muscle as needed for anaphylaxis.   FLUoxetine (PROZAC) 40 MG capsule Take 40 mg by mouth daily.   fluticasone  (FLONASE ) 50 MCG/ACT nasal spray Place 2 sprays into both nostrils daily. 8 am - 11 am   formoterol  (PERFOROMIST ) 20 MCG/2ML nebulizer solution Take 2 mLs (20 mcg total) by nebulization 2 (two) times daily.   furosemide (LASIX) 20 MG tablet Take 20 mg by mouth daily. Hold for SBP<120   hydrOXYzine  (ATARAX ) 25 MG tablet Take 25 mg by mouth 3 (three) times daily as needed for anxiety.   ipratropium-albuterol  (DUONEB) 0.5-2.5 (3) MG/3ML SOLN Take 3 mLs by nebulization every 6 (six) hours as needed.   memantine  (NAMENDA ) 10 MG tablet Take 1 tablet (10 mg total) by mouth 2 (two) times daily.   mineral oil-hydrophilic petrolatum (AQUAPHOR) ointment Apply 1 Application topically every evening.   montelukast  (SINGULAIR ) 10 MG tablet Take 1 tablet (10 mg total) by mouth daily.   nystatin (MYCOSTATIN/NYSTOP) powder Apply 1 Application topically every morning. Apply to bilateral breast folds for yeast rash.   omeprazole (PRILOSEC) 20 MG capsule Take 20 mg by mouth 2 (two) times daily.   Polyethyl Glycol-Propyl Glycol (SYSTANE) 0.4-0.3 % GEL ophthalmic gel Place 1 application into both eyes at bedtime.   polyethylene glycol (MIRALAX  / GLYCOLAX ) packet Take 17 g by mouth every 3 (three) days.   QUEtiapine  (SEROQUEL ) 25 MG tablet Take 25 mg by mouth at bedtime.   UNABLE TO FIND JOBST HOSE KN 15-20 SM BG   vitamin B-12 (CYANOCOBALAMIN) 1000 MCG tablet Take 3,000 mcg by mouth daily.   [DISCONTINUED] rivaroxaban  (XARELTO ) 10 MG TABS tablet Take 1 tablet (10 mg total) by mouth daily.   No facility-administered encounter medications on file as of 05/04/2024.    Review of Systems  Unable to perform ROS: Dementia    Immunization History   Administered Date(s) Administered   Fluad Trivalent(High Dose 65+) 05/25/2023   INFLUENZA, HIGH DOSE SEASONAL PF 04/20/2018, 05/24/2020   Influenza Inj Mdck Quad Pf 05/18/2019, 05/07/2021   Influenza Split 04/04/2012, 05/03/2013, 05/10/2015   Influenza Whole 04/18/2008, 06/04/2009, 04/03/2010, 03/30/2011   Influenza,inj,Quad PF,6+ Mos 03/26/2017   Influenza-Unspecified 03/26/2010, 04/13/2012, 05/08/2013, 05/14/2014, 06/03/2014, 05/13/2015, 04/27/2016, 05/08/2022   Moderna Covid-19 Fall Seasonal Vaccine 16yrs & older 06/08/2022   Moderna Covid-19 Vaccine  Bivalent Booster 98yrs & up 11/28/2021, 05/25/2023   Moderna SARS-COV2 Booster Vaccination 06/13/2020, 03/25/2021, 05/14/2021   Moderna Sars-Covid-2 Vaccination 09/05/2019, 10/03/2019   Pfizer Covid-19 Vaccine Bivalent Booster 8yrs & up 11/26/2022   Pneumococcal Conjugate-13 03/05/2014   Pneumococcal Polysaccharide-23 06/04/2009   Pneumococcal-Unspecified 03/13/2009   Respiratory Syncytial Virus Vaccine,Recomb Aduvanted(Arexvy) 07/23/2022   Td 03/13/2009, 04/13/2012   Tdap 12/01/2022  Zoster Recombinant(Shingrix) 08/03/2017, 10/01/2017   Zoster, Live 03/21/2010   Pertinent  Health Maintenance Due  Topic Date Due   Influenza Vaccine  03/03/2024   DEXA SCAN  Discontinued      08/17/2023    7:01 PM 12/08/2023   11:25 AM 01/05/2024   10:46 AM 03/07/2024    1:31 PM 04/25/2024    1:52 PM  Fall Risk  Falls in the past year? 0 0 0 0 0  Was there an injury with Fall? 0 0 0 0 0  Fall Risk Category Calculator 0 0 0 0 0  Patient at Risk for Falls Due to History of fall(s);Impaired balance/gait History of fall(s);Impaired balance/gait;Impaired mobility History of fall(s);Impaired balance/gait;Impaired mobility History of fall(s);Impaired balance/gait;Impaired mobility History of fall(s);Impaired balance/gait;Impaired mobility  Fall risk Follow up Education provided;Falls evaluation completed Falls evaluation completed;Education provided Falls  evaluation completed;Education provided Falls evaluation completed;Education provided Falls evaluation completed;Education provided   Functional Status Survey:    Vitals:   05/04/24 1824  BP: (!) 149/79  Pulse: 60  Resp: 20  Temp: (!) 97.3 F (36.3 C)  SpO2: 98%   There is no height or weight on file to calculate BMI. Physical Exam Vitals and nursing note reviewed.  Constitutional:      Appearance: Normal appearance.  HENT:     Right Ear: Tympanic membrane normal.     Left Ear: Tympanic membrane normal.     Nose: Nose normal.     Mouth/Throat:     Mouth: Mucous membranes are moist.     Pharynx: Oropharynx is clear.  Eyes:     Conjunctiva/sclera: Conjunctivae normal.     Pupils: Pupils are equal, round, and reactive to light.  Cardiovascular:     Rate and Rhythm: Normal rate and regular rhythm.  Pulmonary:     Effort: Pulmonary effort is normal. No respiratory distress.     Breath sounds: No wheezing or rales.     Comments: Scant rhonchi Abdominal:     General: Bowel sounds are normal. There is no distension.     Palpations: Abdomen is soft.     Tenderness: There is no abdominal tenderness.  Musculoskeletal:     Right lower leg: No edema.     Left lower leg: No edema.  Neurological:     Mental Status: She is alert. Mental status is at baseline.     Labs reviewed: Recent Labs    09/17/23 0000 03/16/24 0000  NA 137 138  K 4.7 4.0  CL 103 104  CO2 22 24*  BUN 27* 23*  CREATININE 1.0 0.9  CALCIUM 9.3 9.5   Recent Labs    09/17/23 0000 03/16/24 0000  AST 17 19  ALT 12 12  ALKPHOS 89 103  ALBUMIN 3.5 3.5   Recent Labs    03/16/24 0000  WBC 6.8  HGB 10.9*  HCT 33*  PLT 247   Lab Results  Component Value Date   TSH 3.24 09/17/2023   No results found for: HGBA1C No results found for: CHOL, HDL, LDLCALC, LDLDIRECT, TRIG, CHOLHDL  Significant Diagnostic Results in last 30 days:  No results found.  Assessment/Plan 1. Acute cough  (Primary) .  Improved but not resolved No fever or resp distress Previously treated with prednisone  with normal xray Ineffective cough Not able to do pulmonary toilet Try robitussin DM, hydrate, continue to monitor Repeat xray if not improving or worsening.  - guaiFENesin -dextromethorphan (ROBITUSSIN DM) 100-10 MG/5ML syrup; Take 20 mLs by mouth in  the morning and at bedtime for 5 days. Continue duonebs prn  2. Moderate persistent asthma Followed by pulmonary On Dupixent , Pulmicort , and Formoterol 

## 2024-05-09 ENCOUNTER — Non-Acute Institutional Stay (SKILLED_NURSING_FACILITY): Payer: Self-pay | Admitting: Orthopedic Surgery

## 2024-05-09 ENCOUNTER — Encounter: Payer: Self-pay | Admitting: Orthopedic Surgery

## 2024-05-09 DIAGNOSIS — F325 Major depressive disorder, single episode, in full remission: Secondary | ICD-10-CM

## 2024-05-09 DIAGNOSIS — R051 Acute cough: Secondary | ICD-10-CM

## 2024-05-09 DIAGNOSIS — K219 Gastro-esophageal reflux disease without esophagitis: Secondary | ICD-10-CM

## 2024-05-09 DIAGNOSIS — G301 Alzheimer's disease with late onset: Secondary | ICD-10-CM

## 2024-05-09 DIAGNOSIS — I10 Essential (primary) hypertension: Secondary | ICD-10-CM | POA: Diagnosis not present

## 2024-05-09 DIAGNOSIS — D631 Anemia in chronic kidney disease: Secondary | ICD-10-CM

## 2024-05-09 DIAGNOSIS — J4550 Severe persistent asthma, uncomplicated: Secondary | ICD-10-CM

## 2024-05-09 DIAGNOSIS — N1831 Chronic kidney disease, stage 3a: Secondary | ICD-10-CM | POA: Diagnosis not present

## 2024-05-09 DIAGNOSIS — F458 Other somatoform disorders: Secondary | ICD-10-CM

## 2024-05-09 DIAGNOSIS — F02818 Dementia in other diseases classified elsewhere, unspecified severity, with other behavioral disturbance: Secondary | ICD-10-CM

## 2024-05-09 NOTE — Progress Notes (Unsigned)
 [facility] Room Number: [facility] Place of Service:  SNF [phone]) Provider:  Greig Cluster, NP  PCP: [name], MD  Patient Care Team: [name], MD as PCP - General (Internal Medicine) Brien Belvie BRAVO, MD (Pulmonary Disease)  Extended Emergency Contact Information Primary Emergency Contact: [place]  [place] Home Phone: [phone] Mobile Phone: [phone] Relation: Daughter  Code Status:  DNR Goals of care: Advanced Directive information    04/17/2024    2:03 PM  Advanced Directives  Does Patient Have a Medical Advance Directive? Yes  Type of Estate agent of [place];Living will;Out of facility DNR (pink MOST or yellow form)  Does patient want to make changes to medical advance directive? No - Patient declined  Copy of Healthcare Power of Attorney in Chart? Yes - validated most recent copy scanned in chart (See row information)     Chief Complaint  Patient presents with   Medical Management of Chronic Issues    Medical Management of Chronic Issues.     HPI:  Pt is a 89 y.o. female seen today for medical management of chronic diseases.    She currently resides on the skilled nursing unit at [facility]. PMH: HTN, allergic rhinitis, asthma, GERD, Alzheimer's, OA, osteoporosis, CKD 3b, and depression.    Acute cough- noted 09/23, CXR unremarkable, improved with prednisone  taper, continues to have cough at times per nursing, remains on robitussin prn Alzheimer's with delusions- CT head 2018 chronic ischemic microvascular disease, behaviors with ADLs, non ambulatory, dependent with ADLs, sleeps most of day per nursing (TSH 3.24 09/17/2023), remains on Namenda , Seroquel  and vistaril  prn HTN- BUN/creat 23/0.9 03/16/2024, remains on lasix Anemia/CKD- see above, GFR 58 (08/05)> was 51 (02/14) Persistent Asthma- followed by Dr. Di seen 02/15/2024> f/u in 1  year, remains on Dupixent , nebulized ICS/LABA and duonebs, Singulair  and Flonase  Bruxism- remains on Seroquel   GERD- hgb 10.9 03/16/2024, remains on Prilosec Depression- no recent mood changes, Na+ 138 03/16/2024, remains on Prozac  Recent weights:  10/01- 167.8 lbs  09/01- 168.4 lbs  08/01- 170.6 lbs  Recent blood pressures:  10/07- 102/65  09/30- 126/78  09/29- 169/76   Past Medical History:  Diagnosis Date   Age-related osteoporosis without current pathological fracture    Alzheimer's disease (HCC)    with late onset   Arthritis    Asthma    Deficiency of other specified B group vitamins    Depression    Environmental allergies    mold   Fall    GERD without esophagitis    Hyperlipidemia    Hypertension    Hypertensive kidney disease with CKD (chronic kidney disease)    with stage I through stage 4 CKD    Memory loss    Seasonal allergies    Unspecified osteoarthritis, unspecified site    Past Surgical History:  Procedure Laterality Date   HIP ARTHROPLASTY  06/25/2011   Procedure: ARTHROPLASTY BIPOLAR HIP;  Surgeon: Lynwood SQUIBB [place];  Location: [facility];  Service: Orthopedics;  Laterality: Right;   NASAL SINUS SURGERY     VAGINAL HYSTERECTOMY      Allergies  Allergen Reactions   Azithromycin  Other (See Comments)    Nausea, weakness    Aspirin Other (See Comments)    Unknown reaction   Erythromycin    Molds & Smuts    Other     Seasonal Allergies.   Septra [Sulfamethoxazole-Trimethoprim]    Sulfa Antibiotics  Sulfonamide Derivatives Other (See Comments)    Unknown reaction    Outpatient Encounter Medications as of 05/09/2024  Medication Sig   acetaminophen  (TYLENOL ) 500 MG tablet Take 500 mg by mouth 3 (three) times daily.   benzonatate  (TESSALON ) 200 MG capsule Take 1 capsule (200 mg total) by mouth 3 (three) times daily as needed for cough.   budesonide  (PULMICORT ) 0.5 MG/2ML nebulizer solution Take 2 mLs (0.5 mg total) by nebulization 2 (two)  times daily.   Calcium Carb-Cholecalciferol (CALCIUM 600-D PO) Take 1 tablet by mouth daily.   chlorhexidine (PERIDEX) 0.12 % solution Use as directed 15 mLs in the mouth or throat 2 (two) times daily.   Cholecalciferol (VITAMIN D) 2000 units tablet Take 2,000 Units by mouth at bedtime.   dextromethorphan (DELSYM) 30 MG/5ML liquid Take 60 mg by mouth every 12 (twelve) hours as needed for cough.   dupilumab  (DUPIXENT ) 300 MG/2ML prefilled syringe Inject 300 mg into the skin every 14 (fourteen) days.   Emollient (CETAPHIL) cream Apply topically 2 (two) times daily.   EPINEPHrine  (ADRENALIN ) 1 MG/ML SOLN injection Inject 0.3 mg into the muscle as needed for anaphylaxis.   FLUoxetine (PROZAC) 40 MG capsule Take 40 mg by mouth daily.   fluticasone  (FLONASE ) 50 MCG/ACT nasal spray Place 2 sprays into both nostrils daily. 8 am - 11 am   formoterol  (PERFOROMIST ) 20 MCG/2ML nebulizer solution Take 2 mLs (20 mcg total) by nebulization 2 (two) times daily.   furosemide (LASIX) 20 MG tablet Take 20 mg by mouth daily. Hold for SBP<120   guaiFENesin -dextromethorphan (ROBITUSSIN DM) 100-10 MG/5ML syrup Take 20 mLs by mouth in the morning and at bedtime for 5 days.   hydrOXYzine  (ATARAX ) 25 MG tablet Take 25 mg by mouth 3 (three) times daily as needed for anxiety.   ipratropium-albuterol  (DUONEB) 0.5-2.5 (3) MG/3ML SOLN Take 3 mLs by nebulization every 6 (six) hours as needed.   memantine  (NAMENDA ) 10 MG tablet Take 1 tablet (10 mg total) by mouth 2 (two) times daily.   mineral oil-hydrophilic petrolatum (AQUAPHOR) ointment Apply 1 Application topically every evening.   montelukast  (SINGULAIR ) 10 MG tablet Take 1 tablet (10 mg total) by mouth daily.   nystatin (MYCOSTATIN/NYSTOP) powder Apply 1 Application topically every morning. Apply to bilateral breast folds for yeast rash.   omeprazole (PRILOSEC) 20 MG capsule Take 20 mg by mouth 2 (two) times daily.   Polyethyl Glycol-Propyl Glycol (SYSTANE) 0.4-0.3 % GEL  ophthalmic gel Place 1 application into both eyes at bedtime.   polyethylene glycol (MIRALAX  / GLYCOLAX ) packet Take 17 g by mouth every 3 (three) days.   QUEtiapine  (SEROQUEL ) 25 MG tablet Take 25 mg by mouth at bedtime.   UNABLE TO FIND JOBST HOSE KN 15-20 SM BG   vitamin B-12 (CYANOCOBALAMIN) 1000 MCG tablet Take 3,000 mcg by mouth daily.   [DISCONTINUED] rivaroxaban  (XARELTO ) 10 MG TABS tablet Take 1 tablet (10 mg total) by mouth daily.   No facility-administered encounter medications on file as of 05/09/2024.    Review of Systems  Unable to perform ROS: Dementia    Immunization History  Administered Date(s) Administered   Fluad Trivalent(High Dose 65+) 05/25/2023   INFLUENZA, HIGH DOSE SEASONAL PF 04/20/2018, 05/24/2020   Influenza Inj Mdck Quad Pf 05/18/2019, 05/07/2021   Influenza Split 04/04/2012, 05/03/2013, 05/10/2015   Influenza Whole 04/18/2008, 06/04/2009, 04/03/2010, 03/30/2011   Influenza,inj,Quad PF,6+ Mos 03/26/2017   Influenza-Unspecified 03/26/2010, 04/13/2012, 05/08/2013, 05/14/2014, 06/03/2014, 05/13/2015, 04/27/2016, 05/08/2022, 05/02/2024   Moderna Covid-19 Fall  Seasonal Vaccine 78yrs & older 06/08/2022   Moderna Covid-19 Vaccine  Bivalent Booster 45yrs & up 11/28/2021, 05/25/2023   Moderna SARS-COV2 Booster Vaccination 06/13/2020, 03/25/2021, 05/14/2021   Moderna Sars-Covid-2 Vaccination 09/05/2019, 10/03/2019   Pfizer Covid-19 Vaccine Bivalent Booster 52yrs & up 11/26/2022   Pneumococcal Conjugate-13 03/05/2014   Pneumococcal Polysaccharide-23 06/04/2009   Pneumococcal-Unspecified 03/13/2009   Respiratory Syncytial Virus Vaccine,Recomb Aduvanted(Arexvy) 07/23/2022   Td 03/13/2009, 04/13/2012   Tdap 12/01/2022   Unspecified SARS-COV-2 Vaccination 05/02/2024   Zoster Recombinant(Shingrix) 08/03/2017, 10/01/2017   Zoster, Live 03/21/2010   Pertinent  Health Maintenance Due  Topic Date Due   Influenza Vaccine  Completed   DEXA SCAN  Discontinued       08/17/2023    7:01 PM 12/08/2023   11:25 AM 01/05/2024   10:46 AM 03/07/2024    1:31 PM 04/25/2024    1:52 PM  Fall Risk  Falls in the past year? 0 0 0 0 0  Was there an injury with Fall? 0 0 0 0 0  Fall Risk Category Calculator 0 0 0 0 0  Patient at Risk for Falls Due to History of fall(s);Impaired balance/gait History of fall(s);Impaired balance/gait;Impaired mobility History of fall(s);Impaired balance/gait;Impaired mobility History of fall(s);Impaired balance/gait;Impaired mobility History of fall(s);Impaired balance/gait;Impaired mobility  Fall risk Follow up Education provided;Falls evaluation completed Falls evaluation completed;Education provided Falls evaluation completed;Education provided Falls evaluation completed;Education provided Falls evaluation completed;Education provided   Functional Status Survey:    Vitals:   05/09/24 1031  BP: 126/78  Pulse: (!) 53  Resp: 20  Temp: (!) 97.2 F (36.2 C)  SpO2: 94%  Weight: 167 lb 12.8 oz (76.1 kg)  Height: 5' 2 (1.575 m)   Body mass index is 30.69 kg/m. Physical Exam Vitals reviewed.  Constitutional:      General: She is not in acute distress. HENT:     Head: Normocephalic.  Eyes:     General:        Right eye: No discharge.        Left eye: No discharge.  Cardiovascular:     Rate and Rhythm: Normal rate and regular rhythm.     Pulses: Normal pulses.     Heart sounds: Normal heart sounds.  Pulmonary:     Effort: Pulmonary effort is normal.     Breath sounds: Normal breath sounds.  Abdominal:     General: Bowel sounds are normal. There is no distension.     Palpations: Abdomen is soft.     Tenderness: There is no abdominal tenderness.  Musculoskeletal:     Cervical back: Neck supple.     Right lower leg: No edema.     Left lower leg: No edema.  Skin:    General: Skin is warm.     Capillary Refill: Capillary refill takes less than 2 seconds.     Comments: Skin tear to left shin, scabbed, no sign of infection   Neurological:     General: No focal deficit present.     Mental Status: She is alert. Mental status is at baseline.     Gait: Gait abnormal.  Psychiatric:     Comments: Aphasia, does not follow commands     Labs reviewed: Recent Labs    09/17/23 0000 03/16/24 0000  NA 137 138  K 4.7 4.0  CL 103 104  CO2 22 24*  BUN 27* 23*  CREATININE 1.0 0.9  CALCIUM 9.3 9.5   Recent Labs    09/17/23 0000 03/16/24 0000  AST 17 19  ALT 12 12  ALKPHOS 89 103  ALBUMIN 3.5 3.5   Recent Labs    03/16/24 0000  WBC 6.8  HGB 10.9*  HCT 33*  PLT 247   Lab Results  Component Value Date   TSH 3.24 09/17/2023   No results found for: HGBA1C No results found for: CHOL, HDL, LDLCALC, LDLDIRECT, TRIG, CHOLHDL  Significant Diagnostic Results in last 30 days:  No results found.  Assessment/Plan 1. Acute cough (Primary) - onset 09/23 - CXR unremarkable - improved with prednisone  taper - still has intermittent cough - cont robitussin  2. Dementia of the Alzheimer's type, with late onset, with delusions (HCC) - MMSE 7/30 - behaviors with ADLs - nonambulatory - dependent with ADLs - cont Namenda , Seroquel  and Vistaril  prn  3. Essential hypertension - controlled with furosemide  4. Anemia due to stage 3a chronic kidney disease (HCC) - hgb stable  5. Severe persistent asthma without complication (HCC) - followed by Dr. Annella - see above - not wheezing on exam today - cont Dupixent , nebulized ICS/LABA and duonebs, Singulair  and Flonase    6. Bruxism - cont Seroquel   7. Gastroesophageal reflux disease without esophagitis - cont omeprazole  8. Major depressive disorder, single episode, in remission - cont Prozac    Family/ staff Communication: plan discussed with patient and nurse  Labs/tests ordered:  none

## 2024-05-10 MED ORDER — GUAIFENESIN-DM 100-10 MG/5ML PO SYRP: 20.0000 mL | ORAL_SOLUTION | Freq: Two times a day (BID) | ORAL | Status: AC

## 2024-05-18 ENCOUNTER — Non-Acute Institutional Stay (SKILLED_NURSING_FACILITY): Payer: Self-pay | Admitting: Adult Health

## 2024-05-18 ENCOUNTER — Encounter: Payer: Self-pay | Admitting: Adult Health

## 2024-05-18 DIAGNOSIS — R1312 Dysphagia, oropharyngeal phase: Secondary | ICD-10-CM

## 2024-05-18 DIAGNOSIS — G301 Alzheimer's disease with late onset: Secondary | ICD-10-CM | POA: Diagnosis not present

## 2024-05-18 DIAGNOSIS — F02818 Dementia in other diseases classified elsewhere, unspecified severity, with other behavioral disturbance: Secondary | ICD-10-CM

## 2024-05-18 DIAGNOSIS — R052 Subacute cough: Secondary | ICD-10-CM | POA: Diagnosis not present

## 2024-05-18 MED ORDER — AMOXICILLIN-POT CLAVULANATE 875-125 MG PO TABS: 1.0000 | ORAL_TABLET | Freq: Two times a day (BID) | ORAL | Status: AC

## 2024-05-18 NOTE — Progress Notes (Signed)
 Location:  Medical illustrator of Service:  SNF (31) Provider:   Bari America, ANP [facility] [phone]   [name], MD  Patient Care Team: [name], MD as PCP - General (Internal Medicine) Brien Belvie BRAVO, MD (Pulmonary Disease)  Extended Emergency Contact Information Primary Emergency Contact: [place]  [place] Home Phone: [phone] Mobile Phone: [phone] Relation: Daughter  Code Status:  DNR Goals of care: Advanced Directive information    04/17/2024    2:03 PM  Advanced Directives  Does Patient Have a Medical Advance Directive? Yes  Type of Estate agent of [place];Living will;Out of facility DNR (pink MOST or yellow form)  Does patient want to make changes to medical advance directive? No - Patient declined  Copy of Healthcare Power of Attorney in Chart? Yes - validated most recent copy scanned in chart (See row information)     Chief Complaint  Patient presents with   Acute Visit    cough    HPI:  Pt is a 89 y.o. female seen today for an acute visit for cough.   She has a persistent cough present for 3 weeks.  Symptoms did improve with prednisone  but are now worse again. CXR 04/25/24 WNL.   She has a hx of persistent asthma and is on Dupixent , Pulmicort , Perforomist , and prn duonebs. Also has tried robitussin and tessalon . Sats are WNL. No fever.   She has severe dementia and her caregiver reports she pockets food every day. They either have to encourage her to swallow or they remove the food.    Past Medical History:  Diagnosis Date   Age-related osteoporosis without current pathological fracture    Alzheimer's disease (HCC)    with late onset   Arthritis    Asthma    Deficiency of other specified B group vitamins    Depression    Environmental allergies    mold   Fall    GERD without esophagitis    Hyperlipidemia    Hypertension     Hypertensive kidney disease with CKD (chronic kidney disease)    with stage I through stage 4 CKD    Memory loss    Seasonal allergies    Unspecified osteoarthritis, unspecified site    Past Surgical History:  Procedure Laterality Date   HIP ARTHROPLASTY  06/25/2011   Procedure: ARTHROPLASTY BIPOLAR HIP;  Surgeon: Lynwood SQUIBB [place];  Location: [facility];  Service: Orthopedics;  Laterality: Right;   NASAL SINUS SURGERY     VAGINAL HYSTERECTOMY      Allergies  Allergen Reactions   Azithromycin  Other (See Comments)    Nausea, weakness    Aspirin Other (See Comments)    Unknown reaction   Erythromycin    Molds & Smuts    Other     Seasonal Allergies.   Septra [Sulfamethoxazole-Trimethoprim]    Sulfa Antibiotics    Sulfonamide Derivatives Other (See Comments)    Unknown reaction    Outpatient Encounter Medications as of 05/18/2024  Medication Sig   amoxicillin -clavulanate (AUGMENTIN ) 875-125 MG tablet Take 1 tablet by mouth 2 (two) times daily for 7 days.   acetaminophen  (TYLENOL ) 500 MG tablet Take 500 mg by mouth 3 (three) times daily.   benzonatate  (TESSALON ) 200 MG capsule Take 1 capsule (200 mg total) by mouth 3 (three) times daily as needed for cough.   budesonide  (PULMICORT ) 0.5 MG/2ML nebulizer solution Take 2 mLs (0.5 mg total) by nebulization 2 (  two) times daily.   Calcium Carb-Cholecalciferol (CALCIUM 600-D PO) Take 1 tablet by mouth daily.   chlorhexidine (PERIDEX) 0.12 % solution Use as directed 15 mLs in the mouth or throat 2 (two) times daily.   Cholecalciferol (VITAMIN D) 2000 units tablet Take 2,000 Units by mouth at bedtime.   dextromethorphan (DELSYM) 30 MG/5ML liquid Take 60 mg by mouth every 12 (twelve) hours as needed for cough.   dupilumab  (DUPIXENT ) 300 MG/2ML prefilled syringe Inject 300 mg into the skin every 14 (fourteen) days.   Emollient (CETAPHIL) cream Apply topically 2 (two) times daily.   EPINEPHrine  (ADRENALIN ) 1 MG/ML SOLN injection Inject 0.3 mg  into the muscle as needed for anaphylaxis.   FLUoxetine (PROZAC) 40 MG capsule Take 40 mg by mouth daily.   fluticasone  (FLONASE ) 50 MCG/ACT nasal spray Place 2 sprays into both nostrils daily. 8 am - 11 am   formoterol  (PERFOROMIST ) 20 MCG/2ML nebulizer solution Take 2 mLs (20 mcg total) by nebulization 2 (two) times daily.   furosemide (LASIX) 20 MG tablet Take 20 mg by mouth daily. Hold for SBP<120   hydrOXYzine  (ATARAX ) 25 MG tablet Take 25 mg by mouth 3 (three) times daily as needed for anxiety.   ipratropium-albuterol  (DUONEB) 0.5-2.5 (3) MG/3ML SOLN Take 3 mLs by nebulization every 6 (six) hours as needed.   memantine  (NAMENDA ) 10 MG tablet Take 1 tablet (10 mg total) by mouth 2 (two) times daily.   mineral oil-hydrophilic petrolatum (AQUAPHOR) ointment Apply 1 Application topically every evening.   montelukast  (SINGULAIR ) 10 MG tablet Take 1 tablet (10 mg total) by mouth daily.   nystatin (MYCOSTATIN/NYSTOP) powder Apply 1 Application topically every morning. Apply to bilateral breast folds for yeast rash.   omeprazole (PRILOSEC) 20 MG capsule Take 20 mg by mouth 2 (two) times daily.   Polyethyl Glycol-Propyl Glycol (SYSTANE) 0.4-0.3 % GEL ophthalmic gel Place 1 application into both eyes at bedtime.   polyethylene glycol (MIRALAX  / GLYCOLAX ) packet Take 17 g by mouth every 3 (three) days.   QUEtiapine  (SEROQUEL ) 25 MG tablet Take 25 mg by mouth at bedtime.   UNABLE TO FIND JOBST HOSE KN 15-20 SM BG   vitamin B-12 (CYANOCOBALAMIN) 1000 MCG tablet Take 3,000 mcg by mouth daily.   [DISCONTINUED] rivaroxaban  (XARELTO ) 10 MG TABS tablet Take 1 tablet (10 mg total) by mouth daily.   No facility-administered encounter medications on file as of 05/18/2024.    Review of Systems  Unable to perform ROS: Dementia    Immunization History  Administered Date(s) Administered   Fluad Trivalent(High Dose 65+) 05/25/2023   INFLUENZA, HIGH DOSE SEASONAL PF 04/20/2018, 05/24/2020   Influenza Inj  Mdck Quad Pf 05/18/2019, 05/07/2021   Influenza Split 04/04/2012, 05/03/2013, 05/10/2015   Influenza Whole 04/18/2008, 06/04/2009, 04/03/2010, 03/30/2011   Influenza,inj,Quad PF,6+ Mos 03/26/2017   Influenza-Unspecified 03/26/2010, 04/13/2012, 05/08/2013, 05/14/2014, 06/03/2014, 05/13/2015, 04/27/2016, 05/08/2022, 05/02/2024   Moderna Covid-19 Fall Seasonal Vaccine 35yrs & older 06/08/2022   Moderna Covid-19 Vaccine  Bivalent Booster 41yrs & up 11/28/2021, 05/25/2023   Moderna SARS-COV2 Booster Vaccination 06/13/2020, 03/25/2021, 05/14/2021   Moderna Sars-Covid-2 Vaccination 09/05/2019, 10/03/2019   Pfizer Covid-19 Vaccine Bivalent Booster 72yrs & up 11/26/2022   Pneumococcal Conjugate-13 03/05/2014   Pneumococcal Polysaccharide-23 06/04/2009   Pneumococcal-Unspecified 03/13/2009   Respiratory Syncytial Virus Vaccine,Recomb Aduvanted(Arexvy) 07/23/2022   Td 03/13/2009, 04/13/2012   Tdap 12/01/2022   Unspecified SARS-COV-2 Vaccination 05/02/2024   Zoster Recombinant(Shingrix) 08/03/2017, 10/01/2017   Zoster, Live 03/21/2010   Pertinent  Health Maintenance Due  Topic Date Due   Influenza Vaccine  Completed   DEXA SCAN  Discontinued      12/08/2023   11:25 AM 01/05/2024   10:46 AM 03/07/2024    1:31 PM 04/25/2024    1:52 PM 05/10/2024   11:46 AM  Fall Risk  Falls in the past year? 0 0 0 0 0  Was there an injury with Fall? 0 0 0 0 0  Fall Risk Category Calculator 0 0 0 0 0  Patient at Risk for Falls Due to History of fall(s);Impaired balance/gait;Impaired mobility History of fall(s);Impaired balance/gait;Impaired mobility History of fall(s);Impaired balance/gait;Impaired mobility History of fall(s);Impaired balance/gait;Impaired mobility History of fall(s);Impaired balance/gait;Impaired mobility  Fall risk Follow up Falls evaluation completed;Education provided Falls evaluation completed;Education provided Falls evaluation completed;Education provided Falls evaluation completed;Education  provided Falls evaluation completed   Functional Status Survey:    Vitals:   05/18/24 1318  BP: 101/64  Pulse: 60  Resp: 20  Temp: 97.7 F (36.5 C)  SpO2: 95%   There is no height or weight on file to calculate BMI. Physical Exam Vitals reviewed.  Constitutional:      General: She is not in acute distress.    Appearance: She is not diaphoretic.  HENT:     Head: Normocephalic and atraumatic.     Nose: Nose normal. No congestion.     Mouth/Throat:     Mouth: Mucous membranes are moist.     Comments: Tried to bite down on tongue blade Eyes:     Comments: Refused to open her eyes  Neck:     Vascular: No JVD.  Cardiovascular:     Rate and Rhythm: Normal rate and regular rhythm.     Heart sounds: No murmur heard. Pulmonary:     Effort: Pulmonary effort is normal. No respiratory distress.     Breath sounds: Rhonchi present. No wheezing.  Abdominal:     General: Bowel sounds are normal. There is no distension.     Palpations: Abdomen is soft.     Tenderness: There is no abdominal tenderness.  Musculoskeletal:     Comments: BLE edema trace  Skin:    General: Skin is warm and dry.  Neurological:     General: No focal deficit present.     Mental Status: She is alert and oriented to person, place, and time. Mental status is at baseline.     Labs reviewed: Recent Labs    09/17/23 0000 03/16/24 0000  NA 137 138  K 4.7 4.0  CL 103 104  CO2 22 24*  BUN 27* 23*  CREATININE 1.0 0.9  CALCIUM 9.3 9.5   Recent Labs    09/17/23 0000 03/16/24 0000  AST 17 19  ALT 12 12  ALKPHOS 89 103  ALBUMIN 3.5 3.5   Recent Labs    03/16/24 0000  WBC 6.8  HGB 10.9*  HCT 33*  PLT 247   Lab Results  Component Value Date   TSH 3.24 09/17/2023   No results found for: HGBA1C No results found for: CHOL, HDL, LDLCALC, LDLDIRECT, TRIG, CHOLHDL  Significant Diagnostic Results in last 30 days:  No results found.  Assessment/Plan 1. Subacute cough  (Primary) Ongoing likely has at least an upper resp infection or possible asp pna - amoxicillin -clavulanate (AUGMENTIN ) 875-125 MG tablet; Take 1 tablet by mouth 2 (two) times daily for 7 days.  2. Oropharyngeal dysphagia ST eval and tx  3. Dementia of the Alzheimer's type, with late onset, with delusions (HCC)  Severe stage Reviewed most form and confirmed DNR, no hospitalizations, no feeding tubes. Ok for antibiotics if necessary.   4. Asthma Not currently wheezing Continue Dupixent , Performorist, and Pulmicort  Duoneb prn

## 2024-06-06 ENCOUNTER — Non-Acute Institutional Stay: Payer: Self-pay | Admitting: Orthopedic Surgery

## 2024-06-06 ENCOUNTER — Encounter: Payer: Self-pay | Admitting: Orthopedic Surgery

## 2024-06-06 DIAGNOSIS — D631 Anemia in chronic kidney disease: Secondary | ICD-10-CM

## 2024-06-06 DIAGNOSIS — F458 Other somatoform disorders: Secondary | ICD-10-CM

## 2024-06-06 DIAGNOSIS — F330 Major depressive disorder, recurrent, mild: Secondary | ICD-10-CM

## 2024-06-06 DIAGNOSIS — I10 Essential (primary) hypertension: Secondary | ICD-10-CM

## 2024-06-06 DIAGNOSIS — K219 Gastro-esophageal reflux disease without esophagitis: Secondary | ICD-10-CM

## 2024-06-06 DIAGNOSIS — J4550 Severe persistent asthma, uncomplicated: Secondary | ICD-10-CM

## 2024-06-06 DIAGNOSIS — F0282 Dementia in other diseases classified elsewhere, unspecified severity, with psychotic disturbance: Secondary | ICD-10-CM

## 2024-06-06 DIAGNOSIS — R051 Acute cough: Secondary | ICD-10-CM | POA: Diagnosis not present

## 2024-06-06 DIAGNOSIS — R1312 Dysphagia, oropharyngeal phase: Secondary | ICD-10-CM

## 2024-06-06 DIAGNOSIS — N1831 Chronic kidney disease, stage 3a: Secondary | ICD-10-CM | POA: Diagnosis not present

## 2024-06-06 DIAGNOSIS — G309 Alzheimer's disease, unspecified: Secondary | ICD-10-CM | POA: Diagnosis not present

## 2024-06-06 NOTE — Progress Notes (Signed)
 Location:   [facility]    Place of Service:   [facility] Provider:  Greig FORBES Cluster, NP   [name], MD  Patient Care Team: [name], MD as PCP - General (Internal Medicine) Brien Belvie FORBES, MD (Pulmonary Disease)  Extended Emergency Contact Information Primary Emergency Contact: [place]anne  [place] Home Phone: [phone] Mobile Phone: [phone] Relation: Daughter  Code Status:  DNR Goals of care: Advanced Directive information    04/17/2024    2:03 PM  Advanced Directives  Does Patient Have a Medical Advance Directive? Yes  Type of Estate Agent of [place];Living will;Out of facility DNR (pink MOST or yellow form)  Does patient want to make changes to medical advance directive? No - Patient declined  Copy of Healthcare Power of Attorney in Chart? Yes - validated most recent copy scanned in chart (See row information)     Chief Complaint  Patient presents with   Medical Management of Chronic Issues    HPI:  Pt is a 89 y.o. female seen today for medical management of chronic diseases.    She currently resides on the skilled nursing unit at [facility]. PMH: HTN, allergic rhinitis, asthma, GERD, Alzheimer's, OA, osteoporosis, CKD 3b, and depression.    Acute cough- noted 09/23, CXR unremarkable, resolved with Augmentin   Alzheimer's with delusions- CT head 2018 chronic ischemic microvascular disease, behaviors with ADLs, non ambulatory, dependent with ADLs, sleeps most of day per nursing (TSH 3.24 09/17/2023), remains on Namenda , Seroquel  and vistaril  prn HTN- BUN/creat 23/0.9 03/16/2024, remains on lasix Anemia/CKD- see above, GFR 58 (08/05)> was 51 (02/14) Persistent Asthma- followed by Dr. Di seen 02/15/2024> f/u in 1 year, remains on Dupixent , nebulized ICS/LABA and duonebs, Singulair  and Flonase  Bruxism- remains on Seroquel   GERD- hgb 10.9 03/16/2024, remains on  Prilosec Depression- no recent mood changes, Na+ 138 03/16/2024, remains on Prozac Dysphagia- 10/30 ST downgraded diet to soft bite sized meats   Recent weights:  11/01- 165.2 lbs  10/01- 167.8 lbs  09/01- 168.4 lbs  Recent blood pressures:  10/28- 138/82  10/23- 123/70  10/21- 144/68  Past Medical History:  Diagnosis Date   Age-related osteoporosis without current pathological fracture    Alzheimer's disease (HCC)    with late onset   Arthritis    Asthma    Deficiency of other specified B group vitamins    Depression    Environmental allergies    mold   Fall    GERD without esophagitis    Hyperlipidemia    Hypertension    Hypertensive kidney disease with CKD (chronic kidney disease)    with stage I through stage 4 CKD    Memory loss    Seasonal allergies    Unspecified osteoarthritis, unspecified site    Past Surgical History:  Procedure Laterality Date   HIP ARTHROPLASTY  06/25/2011   Procedure: ARTHROPLASTY BIPOLAR HIP;  Surgeon: Lynwood SQUIBB [place];  Location: [facility];  Service: Orthopedics;  Laterality: Right;   NASAL SINUS SURGERY     VAGINAL HYSTERECTOMY      Allergies  Allergen Reactions   Azithromycin  Other (See Comments)    Nausea, weakness    Aspirin Other (See Comments)    Unknown reaction   Erythromycin    Molds & Smuts    Other     Seasonal Allergies.   Septra [Sulfamethoxazole-Trimethoprim]    Sulfa Antibiotics    Sulfonamide Derivatives Other (See Comments)    Unknown reaction  Outpatient Encounter Medications as of 06/06/2024  Medication Sig   acetaminophen  (TYLENOL ) 500 MG tablet Take 500 mg by mouth 3 (three) times daily.   benzonatate  (TESSALON ) 200 MG capsule Take 1 capsule (200 mg total) by mouth 3 (three) times daily as needed for cough.   budesonide  (PULMICORT ) 0.5 MG/2ML nebulizer solution Take 2 mLs (0.5 mg total) by nebulization 2 (two) times daily.   Calcium Carb-Cholecalciferol (CALCIUM 600-D PO) Take 1 tablet by mouth daily.    chlorhexidine (PERIDEX) 0.12 % solution Use as directed 15 mLs in the mouth or throat 2 (two) times daily.   Cholecalciferol (VITAMIN D) 2000 units tablet Take 2,000 Units by mouth at bedtime.   dextromethorphan (DELSYM) 30 MG/5ML liquid Take 60 mg by mouth every 12 (twelve) hours as needed for cough.   dupilumab  (DUPIXENT ) 300 MG/2ML prefilled syringe Inject 300 mg into the skin every 14 (fourteen) days.   Emollient (CETAPHIL) cream Apply topically 2 (two) times daily.   EPINEPHrine  (ADRENALIN ) 1 MG/ML SOLN injection Inject 0.3 mg into the muscle as needed for anaphylaxis.   FLUoxetine (PROZAC) 40 MG capsule Take 40 mg by mouth daily.   fluticasone  (FLONASE ) 50 MCG/ACT nasal spray Place 2 sprays into both nostrils daily. 8 am - 11 am   formoterol  (PERFOROMIST ) 20 MCG/2ML nebulizer solution Take 2 mLs (20 mcg total) by nebulization 2 (two) times daily.   furosemide (LASIX) 20 MG tablet Take 20 mg by mouth daily. Hold for SBP<120   hydrOXYzine  (ATARAX ) 25 MG tablet Take 25 mg by mouth 3 (three) times daily as needed for anxiety.   ipratropium-albuterol  (DUONEB) 0.5-2.5 (3) MG/3ML SOLN Take 3 mLs by nebulization every 6 (six) hours as needed.   memantine  (NAMENDA ) 10 MG tablet Take 1 tablet (10 mg total) by mouth 2 (two) times daily.   mineral oil-hydrophilic petrolatum (AQUAPHOR) ointment Apply 1 Application topically every evening.   montelukast  (SINGULAIR ) 10 MG tablet Take 1 tablet (10 mg total) by mouth daily.   nystatin (MYCOSTATIN/NYSTOP) powder Apply 1 Application topically every morning. Apply to bilateral breast folds for yeast rash.   omeprazole (PRILOSEC) 20 MG capsule Take 20 mg by mouth 2 (two) times daily.   Polyethyl Glycol-Propyl Glycol (SYSTANE) 0.4-0.3 % GEL ophthalmic gel Place 1 application into both eyes at bedtime.   polyethylene glycol (MIRALAX  / GLYCOLAX ) packet Take 17 g by mouth every 3 (three) days.   QUEtiapine  (SEROQUEL ) 25 MG tablet Take 25 mg by mouth at bedtime.    UNABLE TO FIND JOBST HOSE KN 15-20 SM BG   vitamin B-12 (CYANOCOBALAMIN) 1000 MCG tablet Take 3,000 mcg by mouth daily.   [DISCONTINUED] rivaroxaban  (XARELTO ) 10 MG TABS tablet Take 1 tablet (10 mg total) by mouth daily.   No facility-administered encounter medications on file as of 06/06/2024.    Review of Systems  Unable to perform ROS: Dementia    Immunization History  Administered Date(s) Administered   Fluad Trivalent(High Dose 65+) 05/25/2023   INFLUENZA, HIGH DOSE SEASONAL PF 04/20/2018, 05/24/2020   Influenza Inj Mdck Quad Pf 05/18/2019, 05/07/2021   Influenza Split 04/04/2012, 05/03/2013, 05/10/2015   Influenza Whole 04/18/2008, 06/04/2009, 04/03/2010, 03/30/2011   Influenza,inj,Quad PF,6+ Mos 03/26/2017   Influenza-Unspecified 03/26/2010, 04/13/2012, 05/08/2013, 05/14/2014, 06/03/2014, 05/13/2015, 04/27/2016, 05/08/2022, 05/02/2024   Moderna Covid-19 Fall Seasonal Vaccine 44yrs & older 06/08/2022   Moderna Covid-19 Vaccine  Bivalent Booster 19yrs & up 11/28/2021, 05/25/2023   Moderna SARS-COV2 Booster Vaccination 06/13/2020, 03/25/2021, 05/14/2021   Moderna Sars-Covid-2 Vaccination 09/05/2019, 10/03/2019  [employer] 22yrs & up 11/26/2022   Pneumococcal Conjugate-13 03/05/2014   Pneumococcal Polysaccharide-23 06/04/2009   Pneumococcal-Unspecified 03/13/2009   Respiratory Syncytial Virus Vaccine,Recomb Aduvanted(Arexvy) 07/23/2022   Td 03/13/2009, 04/13/2012   Tdap 12/01/2022   Unspecified SARS-COV-2 Vaccination 05/02/2024   Zoster Recombinant(Shingrix) 08/03/2017, 10/01/2017   Zoster, Live 03/21/2010   Pertinent  Health Maintenance Due  Topic Date Due   Influenza Vaccine  Completed   DEXA SCAN  Discontinued      12/08/2023   11:25 AM 01/05/2024   10:46 AM 03/07/2024    1:31 PM 04/25/2024    1:52 PM 05/10/2024   11:46 AM  Fall Risk  Falls in the past year? 0 0 0 0 0  Was there an injury with Fall? 0 0 0 0 0  Fall Risk Category  Calculator 0 0 0 0 0  Patient at Risk for Falls Due to History of fall(s);Impaired balance/gait;Impaired mobility History of fall(s);Impaired balance/gait;Impaired mobility History of fall(s);Impaired balance/gait;Impaired mobility History of fall(s);Impaired balance/gait;Impaired mobility History of fall(s);Impaired balance/gait;Impaired mobility  Fall risk Follow up Falls evaluation completed;Education provided Falls evaluation completed;Education provided Falls evaluation completed;Education provided Falls evaluation completed;Education provided Falls evaluation completed   Functional Status Survey:    Vitals:   06/06/24 1450  BP: 138/82  Pulse: 69  Resp: 20  Temp: (!) 97 F (36.1 C)  SpO2: 96%  Weight: 165 lb 3.2 oz (74.9 kg)  Height: 5' 2 (1.575 m)   Body mass index is 30.22 kg/m. Physical Exam Vitals reviewed.  Constitutional:      General: She is not in acute distress. HENT:     Head: Normocephalic.  Eyes:     General:        Right eye: No discharge.        Left eye: No discharge.  Cardiovascular:     Rate and Rhythm: Normal rate and regular rhythm.     Pulses: Normal pulses.     Heart sounds: Normal heart sounds.  Pulmonary:     Effort: Pulmonary effort is normal. No respiratory distress.     Breath sounds: Normal breath sounds. No wheezing, rhonchi or rales.  Abdominal:     General: Bowel sounds are normal. There is no distension.     Palpations: Abdomen is soft.     Tenderness: There is no abdominal tenderness.  Musculoskeletal:     Cervical back: Neck supple.     Right lower leg: No edema.     Left lower leg: No edema.  Skin:    General: Skin is warm.     Capillary Refill: Capillary refill takes less than 2 seconds.     Comments: Non infected skin tera to left shin, wound bed with granulation tissue  Neurological:     General: No focal deficit present.     Mental Status: She is alert. Mental status is at baseline.     Gait: Gait abnormal.  Psychiatric:      Comments: Nonverbal during encounter     Labs reviewed: Recent Labs    09/17/23 0000 03/16/24 0000  NA 137 138  K 4.7 4.0  CL 103 104  CO2 22 24*  BUN 27* 23*  CREATININE 1.0 0.9  CALCIUM 9.3 9.5   Recent Labs    09/17/23 0000 03/16/24 0000  AST 17 19  ALT 12 12  ALKPHOS 89 103  ALBUMIN 3.5 3.5   Recent Labs    03/16/24 0000  WBC 6.8  HGB 10.9*  HCT 33*  PLT 247   Lab Results  Component Value Date   TSH 3.24 09/17/2023   No results found for: HGBA1C No results found for: CHOL, HDL, LDLCALC, LDLDIRECT, TRIG, CHOLHDL  Significant Diagnostic Results in last 30 days:  No results found.  Assessment/Plan 1. Acute cough (Primary) - resolved with Augmentin   2. Dementia in Alzheimer's disease with delusions (HCC) - intermittent delusions of husband - nonambulatory - weight down 3 lbs in 2 months - cont Namenda , Seroquel  and Vistaril   3. Essential hypertension - controlled with furosemide  4. Anemia due to stage 3a chronic kidney disease (HCC) - hgb stable  5. Severe persistent asthma without complication (HCC) -  cont Dupixent , nebulized ICS/LABA and duonebs, Singulair  and Flonase    6. Bruxism - cont Seroquel   7. Gastroesophageal reflux disease without esophagitis - hgb stable - cont omeprazole  8. Mild episode of recurrent major depressive disorder - stable mood with Prozac  9. Oropharyngeal dysphagia - followed by ST - now on soft diet with bite sized meats    Family/ staff Communication: plan discussed with patient and nurse  Labs/tests ordered:  none

## 2024-07-03 ENCOUNTER — Encounter: Payer: Self-pay | Admitting: Internal Medicine

## 2024-07-03 ENCOUNTER — Non-Acute Institutional Stay (SKILLED_NURSING_FACILITY): Payer: Self-pay | Admitting: Internal Medicine

## 2024-07-03 DIAGNOSIS — J4550 Severe persistent asthma, uncomplicated: Secondary | ICD-10-CM

## 2024-07-03 DIAGNOSIS — N1831 Chronic kidney disease, stage 3a: Secondary | ICD-10-CM | POA: Diagnosis not present

## 2024-07-03 DIAGNOSIS — F0282 Dementia in other diseases classified elsewhere, unspecified severity, with psychotic disturbance: Secondary | ICD-10-CM | POA: Diagnosis not present

## 2024-07-03 DIAGNOSIS — K219 Gastro-esophageal reflux disease without esophagitis: Secondary | ICD-10-CM | POA: Diagnosis not present

## 2024-07-03 DIAGNOSIS — F330 Major depressive disorder, recurrent, mild: Secondary | ICD-10-CM

## 2024-07-03 DIAGNOSIS — D631 Anemia in chronic kidney disease: Secondary | ICD-10-CM

## 2024-07-03 DIAGNOSIS — G309 Alzheimer's disease, unspecified: Secondary | ICD-10-CM | POA: Diagnosis not present

## 2024-07-03 DIAGNOSIS — F458 Other somatoform disorders: Secondary | ICD-10-CM

## 2024-07-03 DIAGNOSIS — R1312 Dysphagia, oropharyngeal phase: Secondary | ICD-10-CM

## 2024-07-03 NOTE — Progress Notes (Signed)
 Location:  [facility] Room Number: 121-P Place of Service:  SNF [phone]) Provider:  [name], MD  Patient Care Team: [name], MD as PCP - General (Internal Medicine) Brien Belvie BRAVO, MD (Pulmonary Disease)  Extended Emergency Contact Information Primary Emergency Contact: [place],Suzanne  [place] Home Phone: [phone] Mobile Phone: [phone] Relation: Daughter  Code Status:  DNR Goals of care: Advanced Directive information    07/03/2024   11:45 AM  Advanced Directives  Does Patient Have a Medical Advance Directive? Yes  Type of Estate Agent of [place];Living will;Out of facility DNR (pink MOST or yellow form)  Does patient want to make changes to medical advance directive? No - Patient declined  Copy of Healthcare Power of Attorney in Chart? Yes - validated most recent copy scanned in chart (See row information)  Pre-existing out of facility DNR order (yellow form or pink MOST form) Yellow form placed in chart (order not valid for inpatient use)     Chief Complaint  Patient presents with   Medical Management of Chronic Issues    Routine Visit    HPI:  Pt is a 89 y.o. female seen today for medical management of chronic diseases.    Lives in SNF in [place]   Patient has a history of Alzheimer's dementia dependent for her ADLs Recent Decline with behaviors  She is now Comfort Care Osteoporosis and knee osteoarthritis CKD stage 3b Severe asthma Eosinophilic Sees Pulmonary stable in Dupixent  HLD and hypertension   Teeth Grinding Mouth Guard Helps  She was recently had a cough with negative chest x-ray was treated with Augmentin . For her Alzheimer's patient continues to be totally dependent fires ADLs including needing help with feeding.  She is now downgraded to soft bite-size needs Patient unable to give any history but per nurses she has been stable with her behaviors. Wt  Readings from Last 3 Encounters:  07/03/24 165 lb 3.2 oz (74.9 kg)  06/06/24 165 lb 3.2 oz (74.9 kg)  05/09/24 167 lb 12.8 oz (76.1 kg)  She is nonambulatory.  Hoyer dependent.  Her weight is stable.  No falls  Past Medical History:  Diagnosis Date   Age-related osteoporosis without current pathological fracture    Alzheimer's disease (HCC)    with late onset   Arthritis    Asthma    Deficiency of other specified B group vitamins    Depression    Environmental allergies    mold   Fall    GERD without esophagitis    Hyperlipidemia    Hypertension    Hypertensive kidney disease with CKD (chronic kidney disease)    with stage I through stage 4 CKD    Memory loss    Seasonal allergies    Unspecified osteoarthritis, unspecified site    Past Surgical History:  Procedure Laterality Date   HIP ARTHROPLASTY  06/25/2011   Procedure: ARTHROPLASTY BIPOLAR HIP;  Surgeon: Lynwood SQUIBB [place];  Location: [facility];  Service: Orthopedics;  Laterality: Right;   NASAL SINUS SURGERY     VAGINAL HYSTERECTOMY      Allergies  Allergen Reactions   Azithromycin  Other (See Comments)    Nausea, weakness    Aspirin Other (See Comments)    Unknown reaction   Erythromycin    Molds & Smuts    Other     Seasonal Allergies.   Septra [Sulfamethoxazole-Trimethoprim]    Sulfa Antibiotics    Sulfonamide Derivatives Other (See Comments)  Unknown reaction    Outpatient Encounter Medications as of 07/03/2024  Medication Sig   acetaminophen  (TYLENOL ) 325 MG tablet Take 650 mg by mouth every 4 (four) hours as needed for moderate pain (pain score 4-6).   ammonium lactate (LAC-HYDRIN) 12 % lotion Apply 1 Application topically in the morning and at bedtime.   benzonatate  (TESSALON ) 200 MG capsule Take 1 capsule (200 mg total) by mouth 3 (three) times daily as needed for cough.   budesonide  (PULMICORT ) 0.5 MG/2ML nebulizer solution Take 2 mLs (0.5 mg total) by nebulization 2 (two) times daily.   Calcium  Carb-Cholecalciferol (CALCIUM 600-D PO) Take 1 tablet by mouth daily.   chlorhexidine (PERIDEX) 0.12 % solution Use as directed 15 mLs in the mouth or throat 2 (two) times daily.   Cholecalciferol (VITAMIN D) 2000 units tablet Take 2,000 Units by mouth at bedtime.   dextromethorphan (DELSYM) 30 MG/5ML liquid Take 60 mg by mouth every 12 (twelve) hours as needed for cough.   dupilumab  (DUPIXENT ) 300 MG/2ML prefilled syringe Inject 300 mg into the skin every 14 (fourteen) days.   Emollient (CETAPHIL) cream Apply topically 2 (two) times daily.   EPINEPHrine  (ADRENALIN ) 1 MG/ML SOLN injection Inject 0.3 mg into the muscle as needed for anaphylaxis.   FLUoxetine (PROZAC) 40 MG capsule Take 40 mg by mouth daily.   fluticasone  (FLONASE ) 50 MCG/ACT nasal spray Place 2 sprays into both nostrils daily. 8 am - 11 am   formoterol  (PERFOROMIST ) 20 MCG/2ML nebulizer solution Take 2 mLs (20 mcg total) by nebulization 2 (two) times daily.   furosemide (LASIX) 20 MG tablet Take 20 mg by mouth daily. Hold for SBP<120   hydrOXYzine  (ATARAX ) 25 MG tablet Take 25 mg by mouth 3 (three) times daily as needed for anxiety.   ipratropium-albuterol  (DUONEB) 0.5-2.5 (3) MG/3ML SOLN Take 3 mLs by nebulization every 6 (six) hours as needed.   memantine  (NAMENDA ) 10 MG tablet Take 1 tablet (10 mg total) by mouth 2 (two) times daily.   montelukast  (SINGULAIR ) 10 MG tablet Take 1 tablet (10 mg total) by mouth daily.   nystatin (MYCOSTATIN/NYSTOP) powder Apply 1 Application topically every morning. Apply to bilateral breast folds for yeast rash.   omeprazole (PRILOSEC) 20 MG capsule Take 20 mg by mouth 2 (two) times daily.   Polyethyl Glycol-Propyl Glycol (SYSTANE) 0.4-0.3 % GEL ophthalmic gel Place 1 application into both eyes at bedtime.   polyethylene glycol (MIRALAX  / GLYCOLAX ) packet Take 17 g by mouth every 3 (three) days.   QUEtiapine  (SEROQUEL ) 25 MG tablet Take 25 mg by mouth at bedtime.   UNABLE TO FIND JOBST HOSE KN  15-20 SM BG   vitamin B-12 (CYANOCOBALAMIN) 1000 MCG tablet Take 3,000 mcg by mouth daily.   mineral oil-hydrophilic petrolatum (AQUAPHOR) ointment Apply 1 Application topically every evening. (Patient not taking: Reported on 07/03/2024)   [DISCONTINUED] rivaroxaban  (XARELTO ) 10 MG TABS tablet Take 1 tablet (10 mg total) by mouth daily.   No facility-administered encounter medications on file as of 07/03/2024.    Review of Systems  Unable to perform ROS: Dementia    Immunization History  Administered Date(s) Administered   Fluad Trivalent(High Dose 65+) 05/25/2023   INFLUENZA, HIGH DOSE SEASONAL PF 04/20/2018, 05/24/2020   Influenza Inj Mdck Quad Pf 05/18/2019, 05/07/2021   Influenza Split 04/04/2012, 05/03/2013, 05/10/2015   Influenza Whole 04/18/2008, 06/04/2009, 04/03/2010, 03/30/2011   Influenza,inj,Quad PF,6+ Mos 03/26/2017   Influenza-Unspecified 03/26/2010, 04/13/2012, 05/08/2013, 05/14/2014, 06/03/2014, 05/13/2015, 04/27/2016, 05/08/2022, 05/02/2024   Moderna  Covid-19 Fall Seasonal Vaccine 35yrs & older 06/08/2022   Moderna Covid-19 Vaccine  Bivalent Booster 33yrs & up 11/28/2021, 05/25/2023   Moderna SARS-COV2 Booster Vaccination 06/13/2020, 03/25/2021, 05/14/2021   Moderna Sars-Covid-2 Vaccination 09/05/2019, 10/03/2019   Pfizer Covid-19 Vaccine Bivalent Booster 48yrs & up 11/26/2022   Pneumococcal Conjugate-13 03/05/2014   Pneumococcal Polysaccharide-23 06/04/2009   Pneumococcal-Unspecified 03/13/2009   Respiratory Syncytial Virus Vaccine,Recomb Aduvanted(Arexvy) 07/23/2022   Td 03/13/2009, 04/13/2012   Tdap 12/01/2022   Unspecified SARS-COV-2 Vaccination 05/02/2024   Zoster Recombinant(Shingrix) 08/03/2017, 10/01/2017   Zoster, Live 03/21/2010   Pertinent  Health Maintenance Due  Topic Date Due   Influenza Vaccine  Completed   Bone Density Scan  Discontinued      01/05/2024   10:46 AM 03/07/2024    1:31 PM 04/25/2024    1:52 PM 05/10/2024   11:46 AM 06/06/2024    3:07  PM  Fall Risk  Falls in the past year? 0 0 0 0 0  Was there an injury with Fall? 0 0 0 0 0  Fall Risk Category Calculator 0 0 0 0 0  Patient at Risk for Falls Due to History of fall(s);Impaired balance/gait;Impaired mobility History of fall(s);Impaired balance/gait;Impaired mobility History of fall(s);Impaired balance/gait;Impaired mobility History of fall(s);Impaired balance/gait;Impaired mobility History of fall(s);Impaired balance/gait  Fall risk Follow up Falls evaluation completed;Education provided Falls evaluation completed;Education provided Falls evaluation completed;Education provided Falls evaluation completed Falls evaluation completed   Functional Status Survey:    Vitals:   07/03/24 1140  BP: 131/65  Pulse: (!) 59  Resp: 14  Temp: 98.4 F (36.9 C)  SpO2: 97%  Weight: 165 lb 3.2 oz (74.9 kg)  Height: 5' 2 (1.575 m)   Body mass index is 30.22 kg/m. Physical Exam Vitals reviewed.  Constitutional:      Appearance: Normal appearance.  HENT:     Head: Normocephalic.     Nose: Nose normal.     Mouth/Throat:     Mouth: Mucous membranes are moist.     Pharynx: Oropharynx is clear.  Eyes:     Pupils: Pupils are equal, round, and reactive to light.  Cardiovascular:     Rate and Rhythm: Normal rate and regular rhythm.     Pulses: Normal pulses.     Heart sounds: Normal heart sounds. No murmur heard. Pulmonary:     Effort: Pulmonary effort is normal.     Breath sounds: Normal breath sounds.  Abdominal:     General: Abdomen is flat. Bowel sounds are normal.     Palpations: Abdomen is soft.  Musculoskeletal:        General: No swelling.     Cervical back: Neck supple.  Skin:    General: Skin is warm.  Neurological:     General: No focal deficit present.     Mental Status: She is alert.  Psychiatric:        Mood and Affect: Mood normal.        Thought Content: Thought content normal.     Labs reviewed: Recent Labs    09/17/23 0000 03/16/24 0000  NA 137  138  K 4.7 4.0  CL 103 104  CO2 22 24*  BUN 27* 23*  CREATININE 1.0 0.9  CALCIUM 9.3 9.5   Recent Labs    09/17/23 0000 03/16/24 0000  AST 17 19  ALT 12 12  ALKPHOS 89 103  ALBUMIN 3.5 3.5   Recent Labs    03/16/24 0000  WBC 6.8  HGB 10.9*  HCT 33*  PLT 247   Lab Results  Component Value Date   TSH 3.24 09/17/2023   No results found for: HGBA1C No results found for: CHOL, HDL, LDLCALC, LDLDIRECT, TRIG, CHOLHDL  Significant Diagnostic Results in last 30 days:  No results found.  Assessment/Plan 1. Dementia in Alzheimer's disease with delusions (HCC) (Primary) Patient is on Namenda  and Seroquel  for her behaviors Atarax  PRn Goals are comfort  2. Anemia due to stage 3a chronic kidney disease (HCC) Hemoglobin stable no workup due to her age  70. Severe persistent asthma without complication (HCC) She is on budesonide  and Dupixent  and follows with pulmonary  4. Bruxism Mouth  guard seems to help  5. Gastroesophageal reflux disease without esophagitis Omeprazole  6. Mild episode of recurrent major depressive disorder Prozac  7. Oropharyngeal dysphagia Needs help with feeding sometimes spits out her medicine and her food soft diet with bite sized meats   8 LE edema Lasix    Family/ staff Communication:   Labs/tests ordered:

## 2024-08-08 ENCOUNTER — Encounter: Payer: Self-pay | Admitting: Orthopedic Surgery

## 2024-08-08 ENCOUNTER — Non-Acute Institutional Stay (SKILLED_NURSING_FACILITY): Payer: Self-pay | Admitting: Orthopedic Surgery

## 2024-08-08 DIAGNOSIS — D631 Anemia in chronic kidney disease: Secondary | ICD-10-CM

## 2024-08-08 DIAGNOSIS — K219 Gastro-esophageal reflux disease without esophagitis: Secondary | ICD-10-CM | POA: Diagnosis not present

## 2024-08-08 DIAGNOSIS — F0282 Dementia in other diseases classified elsewhere, unspecified severity, with psychotic disturbance: Secondary | ICD-10-CM | POA: Diagnosis not present

## 2024-08-08 DIAGNOSIS — I10 Essential (primary) hypertension: Secondary | ICD-10-CM | POA: Diagnosis not present

## 2024-08-08 DIAGNOSIS — G309 Alzheimer's disease, unspecified: Secondary | ICD-10-CM | POA: Diagnosis not present

## 2024-08-08 DIAGNOSIS — R1312 Dysphagia, oropharyngeal phase: Secondary | ICD-10-CM

## 2024-08-08 DIAGNOSIS — F339 Major depressive disorder, recurrent, unspecified: Secondary | ICD-10-CM

## 2024-08-08 DIAGNOSIS — F458 Other somatoform disorders: Secondary | ICD-10-CM | POA: Diagnosis not present

## 2024-08-08 DIAGNOSIS — J4540 Moderate persistent asthma, uncomplicated: Secondary | ICD-10-CM | POA: Diagnosis not present

## 2024-08-08 DIAGNOSIS — N1831 Chronic kidney disease, stage 3a: Secondary | ICD-10-CM | POA: Diagnosis not present

## 2024-08-08 NOTE — Progress Notes (Addendum)
 " Location:  [facility] Room Number: 121/P Place of Service:  SNF [id number]) Provider:  Greig FORBES Cluster, NP   [name], MD  Patient Care Team: [name], MD as PCP - General (Internal Medicine) Brien Belvie FORBES, MD (Pulmonary Disease)  Extended Emergency Contact Information Primary Emergency Contact: [place],Suzanne  [place] Home Phone: [phone] Mobile Phone: [phone] Relation: Daughter  Code Status:  DNR Goals of care: Advanced Directive information    07/03/2024   11:45 AM  Advanced Directives  Does Patient Have a Medical Advance Directive? Yes  Type of Estate Agent of [place];Living will;Out of facility DNR (pink MOST or yellow form)  Does patient want to make changes to medical advance directive? No - Patient declined  Copy of Healthcare Power of Attorney in Chart? Yes - validated most recent copy scanned in chart (See row information)  Pre-existing out of facility DNR order (yellow form or pink MOST form) Yellow form placed in chart (order not valid for inpatient use)     Chief Complaint  Patient presents with   Medical Management of Chronic Issues    HPI:  Pt is a 89 y.o. female seen today for medical management of chronic diseases.    She currently resides on the skilled nursing unit at [facility]. PMH: HTN, allergic rhinitis, asthma, GERD, Alzheimer's, OA, osteoporosis, CKD 3b, and depression.   HTN- BUN/creat 23/0.9 03/16/2024, remains on lasix Alzheimer's with delusions- CT head 2018 chronic ischemic microvascular disease, behaviors with ADLs, non ambulatory, dependent with ADLs, sleeps most of day and eating less sometimes per nursing, remains on Namenda , Seroquel  and vistaril  prn Persistent Asthma- followed by Dr. Di seen 02/15/2024> f/u in 1 year, remains on Dupixent , nebulized ICS/LABA and duonebs, Singulair  and Flonase  Anemia/CKD- see above, GFR 58 (08/05)> was  51 (02/14) Bruxism- improved per nursing, remains on Seroquel  GERD- hgb 10.9 03/16/2024, remains on Prilosec Depression- no recent mood changes, Na+ 138 03/16/2024, remains on Prozac Dysphagia- 10/30 ST downgraded diet to soft bite sized meats   Recent weights:  01/01- 167 lbs  12/03- 169 lbs  11/01- 165 lbs   Past Medical History:  Diagnosis Date   Age-related osteoporosis without current pathological fracture    Alzheimer's disease (HCC)    with late onset   Arthritis    Asthma    Deficiency of other specified B group vitamins    Depression    Environmental allergies    mold   Fall    GERD without esophagitis    Hyperlipidemia    Hypertension    Hypertensive kidney disease with CKD (chronic kidney disease)    with stage I through stage 4 CKD    Memory loss    Seasonal allergies    Unspecified osteoarthritis, unspecified site    Past Surgical History:  Procedure Laterality Date   HIP ARTHROPLASTY  06/25/2011   Procedure: ARTHROPLASTY BIPOLAR HIP;  Surgeon: Lynwood SQUIBB [place];  Location: [facility];  Service: Orthopedics;  Laterality: Right;   NASAL SINUS SURGERY     VAGINAL HYSTERECTOMY      Allergies[1]  Outpatient Encounter Medications as of 08/08/2024  Medication Sig   acetaminophen  (TYLENOL ) 325 MG tablet Take 650 mg by mouth every 4 (four) hours as needed for moderate pain (pain score 4-6).   ammonium lactate (LAC-HYDRIN) 12 % lotion Apply 1 Application topically in the morning and at bedtime.   benzonatate  (TESSALON ) 200 MG capsule Take 1 capsule (200 mg  total) by mouth 3 (three) times daily as needed for cough.   budesonide  (PULMICORT ) 0.5 MG/2ML nebulizer solution Take 2 mLs (0.5 mg total) by nebulization 2 (two) times daily.   Calcium Carb-Cholecalciferol (CALCIUM 600-D PO) Take 1 tablet by mouth daily.   chlorhexidine (PERIDEX) 0.12 % solution Use as directed 15 mLs in the mouth or throat 2 (two) times daily.   Cholecalciferol (VITAMIN D) 2000 units tablet Take  2,000 Units by mouth at bedtime.   dextromethorphan (DELSYM) 30 MG/5ML liquid Take 60 mg by mouth every 12 (twelve) hours as needed for cough.   dupilumab  (DUPIXENT ) 300 MG/2ML prefilled syringe Inject 300 mg into the skin every 14 (fourteen) days.   Emollient (CETAPHIL) cream Apply topically 2 (two) times daily.   EPINEPHrine  (ADRENALIN ) 1 MG/ML SOLN injection Inject 0.3 mg into the muscle as needed for anaphylaxis.   FLUoxetine (PROZAC) 40 MG capsule Take 40 mg by mouth daily.   fluticasone  (FLONASE ) 50 MCG/ACT nasal spray Place 2 sprays into both nostrils daily. 8 am - 11 am   formoterol  (PERFOROMIST ) 20 MCG/2ML nebulizer solution Take 2 mLs (20 mcg total) by nebulization 2 (two) times daily.   furosemide (LASIX) 20 MG tablet Take 20 mg by mouth daily. Hold for SBP<120   hydrOXYzine  (ATARAX ) 25 MG tablet Take 25 mg by mouth 3 (three) times daily as needed for anxiety.   ipratropium-albuterol  (DUONEB) 0.5-2.5 (3) MG/3ML SOLN Take 3 mLs by nebulization every 6 (six) hours as needed.   memantine  (NAMENDA ) 10 MG tablet Take 1 tablet (10 mg total) by mouth 2 (two) times daily.   mineral oil-hydrophilic petrolatum (AQUAPHOR) ointment Apply 1 Application topically every evening. (Patient not taking: Reported on 07/03/2024)   montelukast  (SINGULAIR ) 10 MG tablet Take 1 tablet (10 mg total) by mouth daily.   nystatin (MYCOSTATIN/NYSTOP) powder Apply 1 Application topically every morning. Apply to bilateral breast folds for yeast rash.   omeprazole (PRILOSEC) 20 MG capsule Take 20 mg by mouth 2 (two) times daily.   Polyethyl Glycol-Propyl Glycol (SYSTANE) 0.4-0.3 % GEL ophthalmic gel Place 1 application into both eyes at bedtime.   polyethylene glycol (MIRALAX  / GLYCOLAX ) packet Take 17 g by mouth every 3 (three) days.   QUEtiapine  (SEROQUEL ) 25 MG tablet Take 25 mg by mouth at bedtime.   UNABLE TO FIND JOBST HOSE KN 15-20 SM BG   vitamin B-12 (CYANOCOBALAMIN) 1000 MCG tablet Take 3,000 mcg by mouth  daily.   [DISCONTINUED] rivaroxaban  (XARELTO ) 10 MG TABS tablet Take 1 tablet (10 mg total) by mouth daily.   No facility-administered encounter medications on file as of 08/08/2024.    Review of Systems  Unable to perform ROS: Dementia    Immunization History  Administered Date(s) Administered   Fluad Trivalent(High Dose 65+) 05/25/2023   INFLUENZA, HIGH DOSE SEASONAL PF 04/20/2018, 05/24/2020   Influenza Inj Mdck Quad Pf 05/18/2019, 05/07/2021   Influenza Split 04/04/2012, 05/03/2013, 05/10/2015   Influenza Whole 04/18/2008, 06/04/2009, 04/03/2010, 03/30/2011   Influenza,inj,Quad PF,6+ Mos 03/26/2017   Influenza-Unspecified 03/26/2010, 04/13/2012, 05/08/2013, 05/14/2014, 06/03/2014, 05/13/2015, 04/27/2016, 05/08/2022, 05/02/2024   Moderna Covid-19 Fall Seasonal Vaccine 46yrs & older 06/08/2022   Moderna Covid-19 Vaccine  Bivalent Booster 15yrs & up 11/28/2021, 05/25/2023   Moderna SARS-COV2 Booster Vaccination 06/13/2020, 03/25/2021, 05/14/2021   Moderna Sars-Covid-2 Vaccination 09/05/2019, 10/03/2019   Pfizer Covid-19 Vaccine Bivalent Booster 40yrs & up 11/26/2022   Pneumococcal Conjugate-13 03/05/2014   Pneumococcal Polysaccharide-23 06/04/2009   Pneumococcal-Unspecified 03/13/2009   Respiratory Syncytial Virus Vaccine,Recomb Aduvanted(Arexvy)  07/23/2022   Td 03/13/2009, 04/13/2012   Tdap 12/01/2022   Unspecified SARS-COV-2 Vaccination 05/02/2024   Zoster Recombinant(Shingrix) 08/03/2017, 10/01/2017   Zoster, Live 03/21/2010   Pertinent  Health Maintenance Due  Topic Date Due   Influenza Vaccine  Completed   Bone Density Scan  Discontinued      01/05/2024   10:46 AM 03/07/2024    1:31 PM 04/25/2024    1:52 PM 05/10/2024   11:46 AM 06/06/2024    3:07 PM  Fall Risk  Falls in the past year? 0 0 0 0 0  Was there an injury with Fall? 0  0  0  0  0   Fall Risk Category Calculator 0 0 0 0 0  Patient at Risk for Falls Due to History of fall(s);Impaired balance/gait;Impaired  mobility History of fall(s);Impaired balance/gait;Impaired mobility History of fall(s);Impaired balance/gait;Impaired mobility History of fall(s);Impaired balance/gait;Impaired mobility History of fall(s);Impaired balance/gait  Fall risk Follow up Falls evaluation completed;Education provided Falls evaluation completed;Education provided Falls evaluation completed;Education provided Falls evaluation completed Falls evaluation completed     Data saved with a previous flowsheet row definition   Functional Status Survey:    Vitals:   08/08/24 1804  BP: 116/67  Pulse: 62  Resp: 16  Temp: (!) 97.2 F (36.2 C)  SpO2: 98%  Weight: 167 lb (75.8 kg)  Height: 5' 2 (1.575 m)   Body mass index is 30.54 kg/m. Physical Exam Vitals reviewed.  HENT:     Head: Normocephalic.     Right Ear: There is no impacted cerumen.     Left Ear: There is no impacted cerumen.     Nose: Nose normal.     Mouth/Throat:     Mouth: Mucous membranes are moist.  Eyes:     General:        Right eye: No discharge.        Left eye: No discharge.  Cardiovascular:     Rate and Rhythm: Normal rate and regular rhythm.     Pulses: Normal pulses.     Heart sounds: Normal heart sounds.  Pulmonary:     Effort: Pulmonary effort is normal.     Breath sounds: Normal breath sounds.  Abdominal:     General: Bowel sounds are normal. There is no distension.     Palpations: Abdomen is soft.     Tenderness: There is no abdominal tenderness.  Musculoskeletal:     Cervical back: Neck supple.     Right lower leg: No edema.     Left lower leg: No edema.  Skin:    General: Skin is warm.     Capillary Refill: Capillary refill takes less than 2 seconds.  Neurological:     General: No focal deficit present.     Mental Status: She is easily aroused. Mental status is at baseline.     Motor: Weakness present.     Gait: Gait abnormal.  Psychiatric:     Comments: Does not follow commands, alert to self     Labs  reviewed: Recent Labs    09/17/23 0000 03/16/24 0000  NA 137 138  K 4.7 4.0  CL 103 104  CO2 22 24*  BUN 27* 23*  CREATININE 1.0 0.9  CALCIUM 9.3 9.5   Recent Labs    09/17/23 0000 03/16/24 0000  AST 17 19  ALT 12 12  ALKPHOS 89 103  ALBUMIN 3.5 3.5   Recent Labs    03/16/24 0000  WBC 6.8  HGB 10.9*  HCT 33*  PLT 247   Lab Results  Component Value Date   TSH 3.24 09/17/2023   No results found for: HGBA1C No results found for: CHOL, HDL, LDLCALC, LDLDIRECT, TRIG, CHOLHDL  Significant Diagnostic Results in last 30 days:  No results found.  Assessment/Plan 1. Essential hypertension (Primary) - controlled with furosemide  2. 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 Moderate persistent asthma without complication - cont Dupixent , nebulized ICS/LABA and duonebs, Singulair  and Flonase    4. Anemia due to stage 3a chronic kidney disease (HCC) - hgb stable - associated with advanced age  4. Bruxism - improved per nursing/caregiver  - will reduce Seroquel  to 12.5 mg   6. Gastroesophageal reflux disease without esophagitis - hgb stable with omeprazole  7. Recurrent depression - no changes in mood - cont Prozac     Family/ staff Communication: plan discussed with patient and nurse  Labs/tests ordered:  none      [1]  Allergies Allergen Reactions   Azithromycin  Other (See Comments)    Nausea, weakness    Aspirin Other (See Comments)    Unknown reaction   Erythromycin    Molds & Smuts    Other     Seasonal Allergies.   Septra [Sulfamethoxazole-Trimethoprim]    Sulfa Antibiotics    Sulfonamide Derivatives Other (See Comments)    Unknown reaction   "

## 2024-09-07 ENCOUNTER — Non-Acute Institutional Stay: Payer: Self-pay | Admitting: Adult Health

## 2024-09-07 DIAGNOSIS — J069 Acute upper respiratory infection, unspecified: Secondary | ICD-10-CM

## 2024-09-08 ENCOUNTER — Encounter: Payer: Self-pay | Admitting: Adult Health

## 2024-09-08 NOTE — Progress Notes (Signed)
 " Location:  [facility] of Service:  SNF (31) Provider:   Bari [place], ANP [facility] [phone]   [name], MD  Patient Care Team: [name], MD as PCP - General (Internal Medicine) Brien Belvie BRAVO, MD (Pulmonary Disease)  Extended Emergency Contact Information Primary Emergency Contact: [place]ham,Suzanne  [place] Home Phone: [phone] Mobile Phone: [phone] Relation: Daughter  Code Status:  DNR Goals of care: Advanced Directive information    07/03/2024   11:45 AM  Advanced Directives  Does Patient Have a Medical Advance Directive? Yes  Type of Estate Agent of [place];Living will;Out of facility DNR (pink MOST or yellow form)  Does patient want to make changes to medical advance directive? No - Patient declined  Copy of Healthcare Power of Attorney in Chart? Yes - validated most recent copy scanned in chart (See row information)  Pre-existing out of facility DNR order (yellow form or pink MOST form) Yellow form placed in chart (order not valid for inpatient use)     Chief Complaint  Patient presents with   Acute Visit    cough    HPI:   The patient is a 89 year old with asthma who presents with cough and congestion. Hx of dementia, resides in skilled care.   Cough and upper respiratory symptoms - Cough and congestion present since February 3rd - No sputum production - No fever - No shortness of breath - Tessalon  Perles provides some relief  Oxygenation status - Oxygen saturation is 95% on room air, consistent with baseline  Asthma management - Asthma treated with Dupixent , Brovana, and Pulmicort  - Followed by pulmonology  Labs Influenza A/B (09/05/2024): Negative COVID-19 (09/05/2024): Negative Past Medical History:  Diagnosis Date   Age-related osteoporosis without current pathological fracture    Alzheimer's disease (HCC)    with late onset    Arthritis    Asthma    Deficiency of other specified B group vitamins    Depression    Environmental allergies    mold   Fall    GERD without esophagitis    Hyperlipidemia    Hypertension    Hypertensive kidney disease with CKD (chronic kidney disease)    with stage I through stage 4 CKD    Memory loss    Seasonal allergies    Unspecified osteoarthritis, unspecified site    Past Surgical History:  Procedure Laterality Date   HIP ARTHROPLASTY  06/25/2011   Procedure: ARTHROPLASTY BIPOLAR HIP;  Surgeon: Lynwood SQUIBB [place];  Location: [facility] ORS;  Service: Orthopedics;  Laterality: Right;   NASAL SINUS SURGERY     VAGINAL HYSTERECTOMY      Allergies[1]  Outpatient Encounter Medications as of 09/07/2024  Medication Sig   acetaminophen  (TYLENOL ) 325 MG tablet Take 650 mg by mouth every 4 (four) hours as needed for moderate pain (pain score 4-6).   ammonium lactate (LAC-HYDRIN) 12 % lotion Apply 1 Application topically in the morning and at bedtime.   benzonatate  (TESSALON ) 200 MG capsule Take 1 capsule (200 mg total) by mouth 3 (three) times daily as needed for cough.   budesonide  (PULMICORT ) 0.5 MG/2ML nebulizer solution Take 2 mLs (0.5 mg total) by nebulization 2 (two) times daily.   Calcium Carb-Cholecalciferol (CALCIUM 600-D PO) Take 1 tablet by mouth daily.   chlorhexidine (PERIDEX) 0.12 % solution Use as directed 15 mLs in the mouth or throat 2 (two) times daily.   Cholecalciferol (VITAMIN D) 2000  units tablet Take 2,000 Units by mouth at bedtime.   dextromethorphan (DELSYM) 30 MG/5ML liquid Take 60 mg by mouth every 12 (twelve) hours as needed for cough.   dupilumab  (DUPIXENT ) 300 MG/2ML prefilled syringe Inject 300 mg into the skin every 14 (fourteen) days.   Emollient (CETAPHIL) cream Apply topically 2 (two) times daily.   EPINEPHrine  (ADRENALIN ) 1 MG/ML SOLN injection Inject 0.3 mg into the muscle as needed for anaphylaxis.   FLUoxetine (PROZAC) 40 MG capsule Take 40 mg by  mouth daily.   fluticasone  (FLONASE ) 50 MCG/ACT nasal spray Place 2 sprays into both nostrils daily. 8 am - 11 am   formoterol  (PERFOROMIST ) 20 MCG/2ML nebulizer solution Take 2 mLs (20 mcg total) by nebulization 2 (two) times daily.   furosemide (LASIX) 20 MG tablet Take 20 mg by mouth daily. Hold for SBP<120   hydrOXYzine  (ATARAX ) 25 MG tablet Take 25 mg by mouth 3 (three) times daily as needed for anxiety.   ipratropium-albuterol  (DUONEB) 0.5-2.5 (3) MG/3ML SOLN Take 3 mLs by nebulization every 6 (six) hours as needed.   memantine  (NAMENDA ) 10 MG tablet Take 1 tablet (10 mg total) by mouth 2 (two) times daily.   montelukast  (SINGULAIR ) 10 MG tablet Take 1 tablet (10 mg total) by mouth daily.   nystatin (MYCOSTATIN/NYSTOP) powder Apply 1 Application topically every morning. Apply to bilateral breast folds for yeast rash.   omeprazole (PRILOSEC) 20 MG capsule Take 20 mg by mouth 2 (two) times daily.   Polyethyl Glycol-Propyl Glycol (SYSTANE) 0.4-0.3 % GEL ophthalmic gel Place 1 application into both eyes at bedtime.   polyethylene glycol (MIRALAX  / GLYCOLAX ) packet Take 17 g by mouth every 3 (three) days.   QUEtiapine  (SEROQUEL ) 25 MG tablet Take 12.5 mg by mouth at bedtime.   UNABLE TO FIND JOBST HOSE KN 15-20 SM BG   vitamin B-12 (CYANOCOBALAMIN) 1000 MCG tablet Take 3,000 mcg by mouth daily.   No facility-administered encounter medications on file as of 09/07/2024.    Review of Systems  Constitutional:  Negative for activity change, appetite change, chills, diaphoresis, fatigue and fever.  HENT:  Positive for congestion.   Respiratory:  Positive for cough and wheezing. Negative for shortness of breath.   Cardiovascular:  Negative for chest pain and leg swelling.  Gastrointestinal:  Negative for abdominal distention, abdominal pain, constipation, diarrhea, nausea and vomiting.  Genitourinary:  Negative for difficulty urinating, dysuria and urgency.  Musculoskeletal:  Positive for gait  problem. Negative for back pain, myalgias and neck pain.  Skin:  Negative for rash.  Neurological:  Negative for dizziness and weakness.  Psychiatric/Behavioral:  Negative for confusion (chronic).     Immunization History  Administered Date(s) Administered   Fluad Trivalent(High Dose 65+) 05/25/2023   INFLUENZA, HIGH DOSE SEASONAL PF 04/20/2018, 05/24/2020   Influenza Inj Mdck Quad Pf 05/18/2019, 05/07/2021   Influenza Split 04/04/2012, 05/03/2013, 05/10/2015   Influenza Whole 04/18/2008, 06/04/2009, 04/03/2010, 03/30/2011   Influenza,inj,Quad PF,6+ Mos 03/26/2017   Influenza-Unspecified 03/26/2010, 04/13/2012, 05/08/2013, 05/14/2014, 06/03/2014, 05/13/2015, 04/27/2016, 05/08/2022, 05/02/2024   Moderna Covid-19 Fall Seasonal Vaccine 44yrs & older 06/08/2022   Moderna Covid-19 Vaccine  Bivalent Booster 51yrs & up 11/28/2021, 05/25/2023   Moderna SARS-COV2 Booster Vaccination 06/13/2020, 03/25/2021, 05/14/2021   Moderna Sars-Covid-2 Vaccination 09/05/2019, 10/03/2019   Pfizer Covid-19 Vaccine Bivalent Booster 57yrs & up 11/26/2022   Pneumococcal Conjugate-13 03/05/2014   Pneumococcal Polysaccharide-23 06/04/2009   Pneumococcal-Unspecified 03/13/2009   Respiratory Syncytial Virus Vaccine,Recomb Aduvanted(Arexvy) 07/23/2022   Td 03/13/2009, 04/13/2012  Tdap 12/01/2022   Unspecified SARS-COV-2 Vaccination 05/02/2024   Zoster Recombinant(Shingrix) 08/03/2017, 10/01/2017   Zoster, Live 03/21/2010   Pertinent  Health Maintenance Due  Topic Date Due   Influenza Vaccine  Completed   Bone Density Scan  Discontinued      03/07/2024    1:31 PM 04/25/2024    1:52 PM 05/10/2024   11:46 AM 06/06/2024    3:07 PM 08/09/2024   12:09 PM  Fall Risk  Falls in the past year? 0 0 0 0 Exclusion - non ambulatory  Was there an injury with Fall? 0  0  0  0    Fall Risk Category Calculator 0 0 0 0   Patient at Risk for Falls Due to History of fall(s);Impaired balance/gait;Impaired mobility History of  fall(s);Impaired balance/gait;Impaired mobility History of fall(s);Impaired balance/gait;Impaired mobility History of fall(s);Impaired balance/gait   Fall risk Follow up Falls evaluation completed;Education provided Falls evaluation completed;Education provided Falls evaluation completed Falls evaluation completed      Data saved with a previous flowsheet row definition   Functional Status Survey:    Vitals:   09/08/24 1201  BP: (!) 120/54  Pulse: 60  Resp: 19  Temp: (!) 97.2 F (36.2 C)  SpO2: 94%   There is no height or weight on file to calculate BMI. Physical Exam Vitals and nursing note reviewed.  Constitutional:      Appearance: Normal appearance.  HENT:     Head: Normocephalic and atraumatic.     Right Ear: Tympanic membrane normal.     Left Ear: Tympanic membrane normal.     Nose: Nose normal.     Mouth/Throat:     Comments: Bit down on tongue blade for exam, not able to see OP Eyes:     Conjunctiva/sclera: Conjunctivae normal.     Pupils: Pupils are equal, round, and reactive to light.  Cardiovascular:     Rate and Rhythm: Normal rate and regular rhythm.     Heart sounds: No murmur heard. Pulmonary:     Effort: Pulmonary effort is normal.     Breath sounds: Wheezing present.  Abdominal:     General: Bowel sounds are normal. There is no distension.     Palpations: Abdomen is soft.     Tenderness: There is no abdominal tenderness.     Comments: rotund  Musculoskeletal:     Cervical back: No rigidity or tenderness.     Right lower leg: No edema.     Left lower leg: No edema.  Lymphadenopathy:     Cervical: Cervical adenopathy present.  Skin:    General: Skin is warm and dry.  Neurological:     Mental Status: She is alert. Mental status is at baseline.     Labs reviewed: Recent Labs    09/17/23 0000 03/16/24 0000  NA 137 138  K 4.7 4.0  CL 103 104  CO2 22 24*  BUN 27* 23*  CREATININE 1.0 0.9  CALCIUM 9.3 9.5   Recent Labs    09/17/23 0000  03/16/24 0000  AST 17 19  ALT 12 12  ALKPHOS 89 103  ALBUMIN 3.5 3.5   Recent Labs    03/16/24 0000  WBC 6.8  HGB 10.9*  HCT 33*  PLT 247   Lab Results  Component Value Date   TSH 3.24 09/17/2023   No results found for: HGBA1C No results found for: CHOL, HDL, LDLCALC, LDLDIRECT, TRIG, CHOLHDL  Significant Diagnostic Results in last 30 days:  No  results found.  Assessment/Plan  URI with hx of asthma.   Mild wheezing and cough.Oxygen saturation at baseline. Negative flu and COVID tests. No shortness of breath. - Monitor vital signs every shift. - Duonebs bid x 3 days  - Order chest x-ray and initiate prednisone  if symptoms worsen or persist.     [1]  Allergies Allergen Reactions   Azithromycin  Other (See Comments)    Nausea, weakness    Aspirin Other (See Comments)    Unknown reaction   Erythromycin    Molds & Smuts    Other     Seasonal Allergies.   Septra [Sulfamethoxazole-Trimethoprim]    Sulfa Antibiotics    Sulfonamide Derivatives Other (See Comments)    Unknown reaction   "
